# Patient Record
Sex: Female | Born: 1935 | Race: White | Hispanic: No | Smoking: Former smoker
Health system: Southern US, Community
[De-identification: ages and names within clinical notes are randomized; demographics above are authoritative.]

## PROBLEM LIST (undated history)

## (undated) ENCOUNTER — Ambulatory Visit: Payer: MEDICARE

## (undated) ENCOUNTER — Telehealth: Attending: Dermatology | Primary: Dermatology

## (undated) ENCOUNTER — Telehealth

## (undated) ENCOUNTER — Ambulatory Visit: Payer: MEDICARE | Attending: MOHS-Micrographic Surgery | Primary: MOHS-Micrographic Surgery

## (undated) ENCOUNTER — Telehealth: Attending: MOHS-Micrographic Surgery | Primary: MOHS-Micrographic Surgery

## (undated) ENCOUNTER — Ambulatory Visit

## (undated) ENCOUNTER — Ambulatory Visit: Payer: MEDICARE | Attending: Dermatology | Primary: Dermatology

## (undated) DIAGNOSIS — K219 Gastro-esophageal reflux disease without esophagitis: Secondary | ICD-10-CM

## (undated) DIAGNOSIS — I219 Acute myocardial infarction, unspecified: Secondary | ICD-10-CM

## (undated) DIAGNOSIS — E119 Type 2 diabetes mellitus without complications: Secondary | ICD-10-CM

## (undated) DIAGNOSIS — M1990 Unspecified osteoarthritis, unspecified site: Secondary | ICD-10-CM

## (undated) DIAGNOSIS — I4891 Unspecified atrial fibrillation: Secondary | ICD-10-CM

## (undated) DIAGNOSIS — G459 Transient cerebral ischemic attack, unspecified: Secondary | ICD-10-CM

## (undated) DIAGNOSIS — I10 Essential (primary) hypertension: Secondary | ICD-10-CM

## (undated) DIAGNOSIS — E785 Hyperlipidemia, unspecified: Secondary | ICD-10-CM

## (undated) DIAGNOSIS — J449 Chronic obstructive pulmonary disease, unspecified: Secondary | ICD-10-CM

## (undated) DIAGNOSIS — I639 Cerebral infarction, unspecified: Secondary | ICD-10-CM

## (undated) DIAGNOSIS — Z9981 Dependence on supplemental oxygen: Secondary | ICD-10-CM

## (undated) DIAGNOSIS — T7840XA Allergy, unspecified, initial encounter: Secondary | ICD-10-CM

## (undated) HISTORY — DX: Unspecified osteoarthritis, unspecified site: M19.90

## (undated) HISTORY — DX: Hyperlipidemia, unspecified: E78.5

## (undated) HISTORY — DX: Gastro-esophageal reflux disease without esophagitis: K21.9

## (undated) HISTORY — PX: CORONARY ANGIOPLASTY WITH STENT PLACEMENT: SHX49

## (undated) HISTORY — DX: Allergy, unspecified, initial encounter: T78.40XA

## (undated) HISTORY — DX: Acute myocardial infarction, unspecified: I21.9

## (undated) HISTORY — DX: Type 2 diabetes mellitus without complications: E11.9

## (undated) HISTORY — DX: Essential (primary) hypertension: I10

## (undated) NOTE — *Deleted (*Deleted)
Physician Discharge Summary  Peggy [name] [facility] ZOX:[id number] DOB: May 18, 1929 DOA: 03/30/2020  PCP: Lauro Regulus, MD  Admit date: 03/30/2020 Discharge date: 04/01/2020  Discharge disposition: ***   Recommendations for Outpatient Follow-Up:   1. *** (include homehealth, outpatient follow-up instructions, specific recommendations for PCP to follow-up on, etc.) 2. LACE score = ***, corresponding to a *** % of re-admission within 30 days.   (http://tools.farmacologiaclinica.info/index.php?sid=10044)   Discharge Diagnosis:   Principal Problem:   COPD with acute exacerbation (HCC) Active Problems:   Essential (primary) hypertension   AF (paroxysmal atrial fibrillation) (HCC)   Chronic respiratory failure with hypoxia (HCC)   Elevated troponin level   Leukocytosis    Discharge Condition: Stable.  Diet recommendation:  Diet Order            Diet - low sodium heart healthy           Diet Carb Modified           Diet Carb Modified Fluid consistency: Thin; Room service appropriate? Yes  Diet effective now                   Code Status: Full Code     Hospital Course:   ***    Medical Consultants:    ***   Discharge Exam:    Vitals:   03/31/20 2009 03/31/20 2048 04/01/20 0458 04/01/20 0758  BP:  (!) 164/49 (!) 175/64 139/77  Pulse:  67 (!) 59 61  Resp:  18 17 18   Temp:  98.1 F (36.7 C) 97.6 F (36.4 C) 97.9 F (36.6 C)  TempSrc:  Oral Oral Oral  SpO2: 97% 99% 100% 100%  Weight:   77.2 kg   Height:         GEN: NAD SKIN: No rash*** EYES: EOMI*** ENT: MMM CV: RRR PULM: CTA B ABD: soft, ND, NT, +BS CNS: AAO x 3, non focal EXT: No edema or tenderness***   The results of significant diagnostics from this hospitalization (including imaging, microbiology, ancillary and laboratory) are listed below for reference.     Procedures and Diagnostic Studies:   No results found.   Labs:   Basic Metabolic Panel: Recent Labs  Lab 03/30/20  1045 03/31/20 0503  NA 137 134*  K 4.2 4.3  CL 101 96*  CO2 26 28  GLUCOSE 101* 250*  BUN 34* 33*  CREATININE 0.98 0.82  CALCIUM 8.8* 8.8*   GFR Estimated Creatinine Clearance: 44.7 mL/min (by C-G formula based on SCr of 0.82 mg/dL). Liver Function Tests: No results for input(s): AST, ALT, ALKPHOS, BILITOT, PROT, ALBUMIN in the last 168 hours. No results for input(s): LIPASE, AMYLASE in the last 168 hours. No results for input(s): AMMONIA in the last 168 hours. Coagulation profile No results for input(s): INR, PROTIME in the last 168 hours.  CBC: Recent Labs  Lab 03/30/20 1045 03/31/20 0503  WBC 20.7* 17.2*  HGB 10.5* 10.0*  HCT 34.0* 33.0*  MCV 84.2 84.8  PLT 351 302   Cardiac Enzymes: No results for input(s): CKTOTAL, CKMB, CKMBINDEX, TROPONINI in the last 168 hours. BNP: Invalid input(s): POCBNP CBG: Recent Labs  Lab 03/31/20 0750 03/31/20 1145 03/31/20 1702 03/31/20 2049 04/01/20 0754  GLUCAP 198* 233* 228* 242* 170*   D-Dimer No results for input(s): DDIMER in the last 72 hours. Hgb A1c Recent Labs    03/30/20 1457  HGBA1C 7.4*   Lipid Profile No results for input(s): CHOL, HDL, LDLCALC, TRIG, CHOLHDL, LDLDIRECT in  the last 72 hours. Thyroid function studies No results for input(s): TSH, T4TOTAL, T3FREE, THYROIDAB in the last 72 hours.  Invalid input(s): FREET3 Anemia work up No results for input(s): VITAMINB12, FOLATE, FERRITIN, TIBC, IRON, RETICCTPCT in the last 72 hours. Microbiology Recent Results (from the past 240 hour(s))  Respiratory Panel by RT PCR (Flu A&B, Covid) - Nasopharyngeal Swab     Status: None   Collection Time: 03/30/20  2:47 PM   Specimen: Nasopharyngeal Swab  Result Value Ref Range Status   SARS Coronavirus 2 by RT PCR NEGATIVE NEGATIVE Final    Comment: (NOTE) SARS-CoV-2 target nucleic acids are NOT DETECTED.  The SARS-CoV-2 RNA is generally detectable in upper respiratoy specimens during the acute phase of  infection. The lowest concentration of SARS-CoV-2 viral copies this assay can detect is 131 copies/mL. A negative result does not preclude SARS-Cov-2 infection and should not be used as the sole basis for treatment or other patient management decisions. A negative result may occur with  improper specimen collection/handling, submission of specimen other than nasopharyngeal swab, presence of viral mutation(s) within the areas targeted by this assay, and inadequate number of viral copies (<131 copies/mL). A negative result must be combined with clinical observations, patient history, and epidemiological information. The expected result is Negative.  Fact Sheet for Patients:  https://www.moore.com/  Fact Sheet for Healthcare Providers:  https://www.young.biz/  This test is no t yet approved or cleared by the [place] FDA and  has been authorized for detection and/or diagnosis of SARS-CoV-2 by FDA under an Emergency Use Authorization (EUA). This EUA will remain  in effect (meaning this test can be used) for the duration of the COVID-19 declaration under Section 564(b)(1) of the Act, 21 U.S.C. section 360bbb-3(b)(1), unless the authorization is terminated or revoked sooner.     Influenza A by PCR NEGATIVE NEGATIVE Final   Influenza B by PCR NEGATIVE NEGATIVE Final    Comment: (NOTE) The Xpert Xpress SARS-CoV-2/FLU/RSV assay is intended as an aid in  the diagnosis of influenza from Nasopharyngeal swab specimens and  should not be used as a sole basis for treatment. Nasal washings and  aspirates are unacceptable for Xpert Xpress SARS-CoV-2/FLU/RSV  testing.  Fact Sheet for Patients: https://www.moore.com/  Fact Sheet for Healthcare Providers: https://www.young.biz/  This test is not yet approved or cleared by the [place] FDA and  has been authorized for detection and/or diagnosis of SARS-CoV-2  by  FDA under an Emergency Use Authorization (EUA). This EUA will remain  in effect (meaning this test can be used) for the duration of the  Covid-19 declaration under Section 564(b)(1) of the Act, 21  U.S.C. section 360bbb-3(b)(1), unless the authorization is  terminated or revoked. Performed at [facility], [address] [place], [place] [address]   Resp Panel by RT PCR (RSV, Flu A&B, Covid) - Nasopharyngeal Swab     Status: None   Collection Time: 03/30/20  2:47 PM   Specimen: Nasopharyngeal Swab  Result Value Ref Range Status   SARS Coronavirus 2 by RT PCR NEGATIVE NEGATIVE Final    Comment: (NOTE) SARS-CoV-2 target nucleic acids are NOT DETECTED.  The SARS-CoV-2 RNA is generally detectable in upper respiratoy specimens during the acute phase of infection. The lowest concentration of SARS-CoV-2 viral copies this assay can detect is 131 copies/mL. A negative result does not preclude SARS-Cov-2 infection and should not be used as the sole basis for treatment or other patient management decisions. A negative result may occur  with  improper specimen collection/handling, submission of specimen other than nasopharyngeal swab, presence of viral mutation(s) within the areas targeted by this assay, and inadequate number of viral copies (<131 copies/mL). A negative result must be combined with clinical observations, patient history, and epidemiological information. The expected result is Negative.  Fact Sheet for Patients:  https://www.moore.com/  Fact Sheet for Healthcare Providers:  https://www.young.biz/  This test is no t yet approved or cleared by the [place] FDA and  has been authorized for detection and/or diagnosis of SARS-CoV-2 by FDA under an Emergency Use Authorization (EUA). This EUA will remain  in effect (meaning this test can be used) for the duration of the COVID-19 declaration under Section 564(b)(1) of the  Act, 21 U.S.C. section 360bbb-3(b)(1), unless the authorization is terminated or revoked sooner.     Influenza A by PCR NEGATIVE NEGATIVE Final   Influenza B by PCR NEGATIVE NEGATIVE Final    Comment: (NOTE) The Xpert Xpress SARS-CoV-2/FLU/RSV assay is intended as an aid in  the diagnosis of influenza from Nasopharyngeal swab specimens and  should not be used as a sole basis for treatment. Nasal washings and  aspirates are unacceptable for Xpert Xpress SARS-CoV-2/FLU/RSV  testing.  Fact Sheet for Patients: https://www.moore.com/  Fact Sheet for Healthcare Providers: https://www.young.biz/  This test is not yet approved or cleared by the [place] FDA and  has been authorized for detection and/or diagnosis of SARS-CoV-2 by  FDA under an Emergency Use Authorization (EUA). This EUA will remain  in effect (meaning this test can be used) for the duration of the  Covid-19 declaration under Section 564(b)(1) of the Act, 21  U.S.C. section 360bbb-3(b)(1), unless the authorization is  terminated or revoked.    Respiratory Syncytial Virus by PCR NEGATIVE NEGATIVE Final    Comment: (NOTE) Fact Sheet for Patients: https://www.moore.com/  Fact Sheet for Healthcare Providers: https://www.young.biz/  This test is not yet approved or cleared by the [place] FDA and  has been authorized for detection and/or diagnosis of SARS-CoV-2 by  FDA under an Emergency Use Authorization (EUA). This EUA will remain  in effect (meaning this test can be used) for the duration of the  COVID-19 declaration under Section 564(b)(1) of the Act, 21 U.S.C.  section 360bbb-3(b)(1), unless the authorization is terminated or  revoked. Performed at [facility], [address] [place], [place] [address]      Discharge Instructions:   Discharge Instructions    Diet - low sodium heart healthy   Complete by: As  directed    Diet Carb Modified   Complete by: As directed    Increase activity slowly   Complete by: As directed      Allergies as of 04/01/2020      Reactions   Diphenhydramine Rash   AGITATION/DELIRIUM   Metformin Diarrhea   Oxybutynin Other (See Comments)   dizzy   Amlodipine Rash   Ciprofloxacin Rash   Lisinopril Rash   Penicillins Rash   Family states was a "long time ago"   Saxagliptin Rash   Sulfamethoxazole-trimethoprim Rash      Medication List    STOP taking these medications   levofloxacin 500 MG tablet Commonly known as: LEVAQUIN     TAKE these medications   aspirin 81 MG EC tablet Take 1 tablet (81 mg total) by mouth daily.   budesonide-formoterol 80-4.5 MCG/ACT inhaler Commonly known as: SYMBICORT Inhale 2 puffs into the lungs 2 (two) times daily.   chlorpheniramine-HYDROcodone 10-8 MG/5ML  Suer Commonly known as: Tussionex Pennkinetic ER Take 5 mLs by mouth every 12 (twelve) hours as needed for cough.   esomeprazole 40 MG capsule Commonly known as: NexIUM Take 1 capsule (40 mg total) by mouth daily.   gabapentin 100 MG capsule Commonly known as: NEURONTIN Take 100 mg by mouth daily.   guaiFENesin 600 MG 12 hr tablet Commonly known as: MUCINEX Take by mouth 2 (two) times daily.   HumuLIN 70/30 KwikPen (70-30) 100 UNIT/ML KwikPen Generic drug: insulin isophane & regular human Inject 35 Units into the skin 2 (two) times daily.   hydrOXYzine 25 MG tablet Commonly known as: ATARAX/VISTARIL Take 25 mg by mouth 4 (four) times daily as needed for itching.   isosorbide mononitrate 30 MG 24 hr tablet Commonly known as: IMDUR Take 30 mg by mouth daily.   lactobacillus acidophilus Tabs tablet Take 2 tablets by mouth daily.   levothyroxine 50 MCG tablet Commonly known as: SYNTHROID Take 1 tablet (50 mcg total) by mouth daily. PATIENT NEEDS TO SCHEDULE OFFICE VISIT FOR FOLLOW UP   metoprolol succinate 100 MG 24 hr tablet Commonly known as:  TOPROL-XL Take 1 tablet (100 mg total) by mouth daily. Take with or immediately following a meal.   nitroGLYCERIN 0.4 MG SL tablet Commonly known as: NITROSTAT Place 0.4 mg under the tongue as needed for chest pain.   OXYGEN Inhale 2 L into the lungs.   predniSONE 20 MG tablet Commonly known as: DELTASONE Take 2 tablets (40 mg total) by mouth daily with breakfast for 2 days. Start taking on: April 02, 2020   psyllium 0.52 g capsule Commonly known as: REGULOID Take 0.52 g by mouth daily.   traZODone 50 MG tablet Commonly known as: DESYREL Take 50 mg by mouth at bedtime.   trospium 20 MG tablet Commonly known as: SANCTURA Take 1 tablet (20 mg total) by mouth daily. What changed: when to take this   Ventolin HFA 108 (90 Base) MCG/ACT inhaler Generic drug: albuterol Inhale 2 puffs into the lungs every 6 (six) hours as needed.   vitamin B-12 1000 MCG tablet Commonly known as: CYANOCOBALAMIN Take 1,000 mcg by mouth daily.   vitamin C with rose hips 1000 MG tablet Take 1,000 mg by mouth daily.   Vitamin D3 125 MCG (5000 UT) Tabs Take 5,000 Units by mouth daily.         Time coordinating discharge: ***  Signed:  BERNARD AYIKU  [facility] 04/01/2020, 10:45 AM   Pager on [email or web address]. If 7PM-7AM, please contact night-coverage at [email or web address]

## (undated) NOTE — *Deleted (*Deleted)
CARDIOLOGY CONSULT NOTE               Patient ID: Peggy [name] MRN: [id number] DOB/AGE: December 10, 1928 76 y.o.  Admit date: 03/30/2020 Referring Physician *** Primary Physician *** Primary Cardiologist *** Reason for Consultation ***  HPI: ***  Review of systems complete and found to be negative unless listed above     Past Medical History:  Diagnosis Date  . A-fib (HCC)   . Allergy   . Arthritis   . COPD (chronic obstructive pulmonary disease) (HCC)   . Diabetes mellitus without complication (HCC)   . GERD (gastroesophageal reflux disease)   . Hyperlipidemia   . Hypertension   . Oxygen dependent    2 liters  . Stroke [facility])    5 years ago per daughter  . TIA (transient ischemic attack)     Past Surgical History:  Procedure Laterality Date  . ABDOMINAL HYSTERECTOMY  1970  . BACK SURGERY  2013  . colectomy Right 10/08/2013   Dr. Egbert Garibaldi  . CORONARY ANGIOPLASTY WITH STENT PLACEMENT      Medications Prior to Admission  Medication Sig Dispense Refill Last Dose  . Ascorbic Acid (VITAMIN C WITH ROSE HIPS) 1000 MG tablet Take 1,000 mg by mouth daily.   03/29/2020 at 0800  . aspirin EC 81 MG EC tablet Take 1 tablet (81 mg total) by mouth daily. 30 tablet 0 03/29/2020 at 0800  . budesonide-formoterol (SYMBICORT) 80-4.5 MCG/ACT inhaler Inhale 2 puffs into the lungs 2 (two) times daily. 1 each 0 03/29/2020 at 2000  . chlorpheniramine-HYDROcodone (TUSSIONEX PENNKINETIC ER) 10-8 MG/5ML SUER Take 5 mLs by mouth every 12 (twelve) hours as needed for cough. 240 mL 0 Past Week at prn  . Cholecalciferol (VITAMIN D3) 5000 units TABS Take 5,000 Units by mouth daily.   03/29/2020 at 0800  . esomeprazole (NEXIUM) 40 MG capsule Take 1 capsule (40 mg total) by mouth daily. 30 capsule 3 03/29/2020 at 0800  . gabapentin (NEURONTIN) 100 MG capsule Take 100 mg by mouth daily.   03/29/2020 at 2000  . guaiFENesin (MUCINEX) 600 MG 12 hr tablet Take by mouth 2 (two) times daily.   Past Week at prn   . HUMULIN 70/30 KWIKPEN (70-30) 100 UNIT/ML PEN Inject 20 Units into the skin 2 (two) times daily. (Patient taking differently: Inject 35 Units into the skin 2 (two) times daily. ) 15 mL 5 03/29/2020 at 1800  . isosorbide mononitrate (IMDUR) 30 MG 24 hr tablet Take 30 mg by mouth daily.   03/29/2020 at 0800  . lactobacillus acidophilus (BACID) TABS tablet Take 2 tablets by mouth daily.   03/29/2020 at 0800  . levofloxacin (LEVAQUIN) 500 MG tablet Take 1 tablet (500 mg total) by mouth daily. 7 tablet 0 Past Week at Unknown time  . levothyroxine (SYNTHROID, LEVOTHROID) 50 MCG tablet Take 1 tablet (50 mcg total) by mouth daily. PATIENT NEEDS TO SCHEDULE OFFICE VISIT FOR FOLLOW UP 90 tablet 0 03/29/2020 at 0700  . metoprolol succinate (TOPROL-XL) 100 MG 24 hr tablet Take 1 tablet (100 mg total) by mouth daily. Take with or immediately following a meal. 30 tablet 0 03/29/2020 at 0830  . nitroGLYCERIN (NITROSTAT) 0.4 MG SL tablet Place 0.4 mg under the tongue as needed for chest pain.   unknown at prn  . trospium (SANCTURA) 20 MG tablet Take 20 mg by mouth 2 (two) times daily.   03/29/2020 at 0800  . VENTOLIN HFA 108 (90 Base) MCG/ACT inhaler Inhale 2  puffs into the lungs every 6 (six) hours as needed.  1 03/29/2020 at 2200  . vitamin B-12 (CYANOCOBALAMIN) 1000 MCG tablet Take 1,000 mcg by mouth daily.   03/29/2020 at 0800  . hydrOXYzine (ATARAX/VISTARIL) 25 MG tablet Take 25 mg by mouth 4 (four) times daily as needed for itching.  5 unknown at prn  . OXYGEN Inhale 2 L into the lungs.     . psyllium (REGULOID) 0.52 g capsule Take 0.52 g by mouth daily.   unknown at prn  . traZODone (DESYREL) 50 MG tablet Take 50 mg by mouth at bedtime.   unknown at prn   Social History   Socioeconomic History  . Marital status: Widowed    Spouse name: Not on file  . Number of children: 3  . Years of education: H/S  . Highest education level: Not on file  Occupational History  . Occupation: Retired  Tobacco Use   . Smoking status: Former Smoker    Packs/day: 1.00    Years: 30.00    Pack years: 30.00    Quit date: 11/30/1999    Years since quitting: 20.3  . Smokeless tobacco: Never Used  Vaping Use  . Vaping Use: Never used  Substance and Sexual Activity  . Alcohol use: No  . Drug use: No  . Sexual activity: Not on file  Other Topics Concern  . Not on file  Social History Narrative  . Not on file   Social Determinants of Health   Financial Resource Strain:   . Difficulty of Paying Living Expenses: Not on file  Food Insecurity:   . Worried About Programme researcher, broadcasting/film/video in the Last Year: Not on file  . Ran Out of Food in the Last Year: Not on file  Transportation Needs:   . Lack of Transportation (Medical): Not on file  . Lack of Transportation (Non-Medical): Not on file  Physical Activity:   . Days of Exercise per Week: Not on file  . Minutes of Exercise per Session: Not on file  Stress:   . Feeling of Stress : Not on file  Social Connections:   . Frequency of Communication with Friends and Family: Not on file  . Frequency of Social Gatherings with Friends and Family: Not on file  . Attends Religious Services: Not on file  . Active Member of Clubs or Organizations: Not on file  . Attends Banker Meetings: Not on file  . Marital Status: Not on file  Intimate Partner Violence:   . Fear of Current or Ex-Partner: Not on file  . Emotionally Abused: Not on file  . Physically Abused: Not on file  . Sexually Abused: Not on file    Family History  Problem Relation Age of Onset  . Breast cancer Mother   . Pancreatitis Father   . Breast cancer Sister   . Lung cancer Brother   . Melanoma Brother   . Throat cancer Brother       Review of systems complete and found to be negative unless listed above      PHYSICAL EXAM  General: Well developed, well nourished, in no acute distress HEENT:  Normocephalic and atramatic Neck:  No JVD.  Lungs: Clear bilaterally to  auscultation and percussion. Heart: HRRR . Normal S1 and S2 without gallops or murmurs.  Abdomen: Bowel sounds are positive, abdomen soft and non-tender  Msk:  Back normal, normal gait. Normal strength and tone for age. Extremities: No clubbing, cyanosis or edema.  Neuro: Alert and oriented X 3. Psych:  Good affect, responds appropriately  Labs:   Lab Results  Component Value Date   WBC 20.7 (H) 03/30/2020   HGB 10.5 (L) 03/30/2020   HCT 34.0 (L) 03/30/2020   MCV 84.2 03/30/2020   PLT 351 03/30/2020    Recent Labs  Lab 03/30/20 1045  NA 137  K 4.2  CL 101  CO2 26  BUN 34*  CREATININE 0.98  CALCIUM 8.8*  GLUCOSE 101*   Lab Results  Component Value Date   CKTOTAL 130 12/10/2016   CKMB 3.4 10/06/2013   TROPONINI 0.52 (HH) 11/22/2018    Lab Results  Component Value Date   CHOL 216 (H) 03/07/2019   CHOL 175 12/10/2016   CHOL 272 (H) 09/04/2015   Lab Results  Component Value Date   HDL 33 (L) 03/07/2019   HDL 29 (L) 12/10/2016   HDL 33 (L) 09/04/2015   Lab Results  Component Value Date   LDLCALC 110 (H) 03/07/2019   LDLCALC 69 12/10/2016   LDLCALC Comment 09/04/2015   Lab Results  Component Value Date   TRIG 365 (H) 03/07/2019   TRIG 387 (H) 12/10/2016   TRIG 722 (HH) 09/04/2015   Lab Results  Component Value Date   CHOLHDL 6.5 03/07/2019   CHOLHDL 6.0 12/10/2016   CHOLHDL 8.2 (H) 09/04/2015   No results found for: LDLDIRECT    Radiology: DG Chest 2 View  Result Date: 03/30/2020 CLINICAL DATA:  Shortness of breath for 3 weeks EXAM: CHEST - 2 VIEW COMPARISON:  03/22/2020 FINDINGS: Cardiac silhouette remains mildly enlarged. Atherosclerotic calcification of the aortic knob. Mildly hyperexpanded lungs. Chronically coarsened interstitial markings bilaterally. No focal airspace consolidation. No pleural effusion or pneumothorax. IMPRESSION: Stable exam.  No acute cardiopulmonary process. Electronically Signed   By: Duanne Guess D.O.   On: 03/30/2020  12:55   DG Chest 2 View  Result Date: 03/23/2020 CLINICAL DATA:  Dry cough and shortness of breath for 1 week. Ex-smoker. EXAM: CHEST - 2 VIEW COMPARISON:  01/26/2020 FINDINGS: The cardiac silhouette remains borderline enlarged. Stable hyperexpansion of the lungs diffuse prominence of the interstitial markings without pleural fluid. Diffuse osteopenia. IMPRESSION: No acute abnormality. Stable changes of COPD. Electronically Signed   By: Beckie Salts M.D.   On: 03/23/2020 20:47    EKG: ***  ASSESSMENT AND PLAN:  ***  Signed: Alwyn Pea MD 03/30/2020, 10:34 PM

---
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
## 1968-06-02 HISTORY — PX: ABDOMINAL HYSTERECTOMY: SHX81

## 2006-06-10 ENCOUNTER — Ambulatory Visit: Payer: Self-pay | Admitting: Unknown Physician Specialty

## 2007-06-13 ENCOUNTER — Other Ambulatory Visit: Payer: Self-pay

## 2007-06-13 ENCOUNTER — Inpatient Hospital Stay: Payer: Self-pay | Admitting: Endocrinology

## 2007-06-14 ENCOUNTER — Other Ambulatory Visit: Payer: Self-pay

## 2007-06-15 ENCOUNTER — Other Ambulatory Visit: Payer: Self-pay

## 2008-08-07 ENCOUNTER — Emergency Department: Payer: Self-pay | Admitting: Emergency Medicine

## 2008-12-29 ENCOUNTER — Ambulatory Visit: Payer: Self-pay | Admitting: Unknown Physician Specialty

## 2009-02-19 ENCOUNTER — Ambulatory Visit: Payer: Self-pay | Admitting: Pain Medicine

## 2009-03-06 ENCOUNTER — Ambulatory Visit: Payer: Self-pay | Admitting: Pain Medicine

## 2009-09-11 ENCOUNTER — Ambulatory Visit: Payer: Self-pay | Admitting: Neurology

## 2009-10-30 ENCOUNTER — Ambulatory Visit: Payer: Self-pay | Admitting: Neurology

## 2010-02-07 ENCOUNTER — Ambulatory Visit: Payer: Self-pay

## 2010-03-20 ENCOUNTER — Ambulatory Visit: Payer: Self-pay | Admitting: Pain Medicine

## 2010-04-04 ENCOUNTER — Ambulatory Visit: Payer: Self-pay | Admitting: Pain Medicine

## 2010-04-17 ENCOUNTER — Ambulatory Visit: Payer: Self-pay | Admitting: Pain Medicine

## 2010-05-21 ENCOUNTER — Ambulatory Visit: Payer: Self-pay | Admitting: Pain Medicine

## 2010-06-24 ENCOUNTER — Ambulatory Visit: Payer: Self-pay | Admitting: Pain Medicine

## 2010-09-13 ENCOUNTER — Encounter: Payer: Self-pay | Admitting: Internal Medicine

## 2010-10-01 ENCOUNTER — Encounter: Payer: Self-pay | Admitting: Internal Medicine

## 2011-01-23 ENCOUNTER — Ambulatory Visit: Payer: Self-pay | Admitting: Internal Medicine

## 2011-02-01 ENCOUNTER — Ambulatory Visit: Payer: Self-pay | Admitting: Internal Medicine

## 2011-04-09 ENCOUNTER — Inpatient Hospital Stay: Payer: Self-pay | Admitting: Internal Medicine

## 2011-06-03 HISTORY — PX: BACK SURGERY: SHX140

## 2011-08-07 ENCOUNTER — Emergency Department: Payer: Self-pay | Admitting: Unknown Physician Specialty

## 2011-08-07 LAB — CBC
HGB: 12.2 g/dL (ref 12.0–16.0)
MCHC: 31.4 g/dL — ABNORMAL LOW (ref 32.0–36.0)
Platelet: 369 10*3/uL (ref 150–440)
RDW: 15.1 % — ABNORMAL HIGH (ref 11.5–14.5)
WBC: 25.3 10*3/uL — ABNORMAL HIGH (ref 3.6–11.0)

## 2011-08-07 LAB — PROTIME-INR: Prothrombin Time: 15.8 secs — ABNORMAL HIGH (ref 11.5–14.7)

## 2011-08-08 LAB — BASIC METABOLIC PANEL
Anion Gap: 25 — ABNORMAL HIGH (ref 7–16)
Calcium, Total: 8.6 mg/dL (ref 8.5–10.1)
Chloride: 94 mmol/L — ABNORMAL LOW (ref 98–107)
Co2: 13 mmol/L — ABNORMAL LOW (ref 21–32)
EGFR (Non-African Amer.): 28 — ABNORMAL LOW
Glucose: 820 mg/dL (ref 65–99)
Osmolality: 311 (ref 275–301)
Potassium: 5.7 mmol/L — ABNORMAL HIGH (ref 3.5–5.1)
Sodium: 132 mmol/L — ABNORMAL LOW (ref 136–145)

## 2011-08-08 LAB — TROPONIN I: Troponin-I: 0.81 ng/mL — ABNORMAL HIGH

## 2012-02-09 ENCOUNTER — Encounter: Payer: Self-pay | Admitting: Cardiology

## 2012-03-02 ENCOUNTER — Encounter: Payer: Self-pay | Admitting: Cardiology

## 2012-04-02 ENCOUNTER — Encounter: Payer: Self-pay | Admitting: Cardiology

## 2012-05-02 ENCOUNTER — Encounter: Payer: Self-pay | Admitting: Cardiology

## 2012-08-10 ENCOUNTER — Ambulatory Visit: Payer: Self-pay | Admitting: Internal Medicine

## 2012-08-11 ENCOUNTER — Emergency Department: Payer: Self-pay | Admitting: Emergency Medicine

## 2012-08-11 LAB — COMPREHENSIVE METABOLIC PANEL
BUN: 20 mg/dL — ABNORMAL HIGH (ref 7–18)
Bilirubin,Total: 0.4 mg/dL (ref 0.2–1.0)
Calcium, Total: 8.7 mg/dL (ref 8.5–10.1)
Chloride: 105 mmol/L (ref 98–107)
EGFR (African American): >60
EGFR (Non-African Amer.): 52 — ABNORMAL LOW
Glucose: 145 mg/dL — ABNORMAL HIGH (ref 65–99)
Osmolality: 273 (ref 275–301)
Potassium: 4.5 mmol/L (ref 3.5–5.1)
SGPT (ALT): 18 U/L (ref 12–78)

## 2012-08-11 LAB — URINALYSIS, COMPLETE
Blood: NEGATIVE
Glucose,UR: NEGATIVE mg/dL (ref 0–75)
Hyaline Cast: 43
Ketone: NEGATIVE
Nitrite: NEGATIVE
Ph: 5 (ref 4.5–8.0)
Protein: NEGATIVE
Specific Gravity: 1.02 (ref 1.003–1.030)
Squamous Epithelial: 1
Transitional Epi: 2

## 2012-08-11 LAB — CBC
HGB: 7.9 g/dL — ABNORMAL LOW (ref 12.0–16.0)
MCH: 18.2 pg — ABNORMAL LOW (ref 26.0–34.0)
MCHC: 28.6 g/dL — ABNORMAL LOW (ref 32.0–36.0)
MCV: 64 fL — ABNORMAL LOW (ref 80–100)
RDW: 17.3 % — ABNORMAL HIGH (ref 11.5–14.5)
WBC: 13.1 10*3/uL — ABNORMAL HIGH (ref 3.6–11.0)

## 2012-08-11 LAB — PROTIME-INR
INR: 1.6
Prothrombin Time: 18.5 secs — ABNORMAL HIGH (ref 11.5–14.7)

## 2012-08-11 LAB — LIPASE, BLOOD: Lipase: 92 U/L (ref 73–393)

## 2012-09-09 ENCOUNTER — Ambulatory Visit: Payer: Self-pay | Admitting: Internal Medicine

## 2012-09-30 ENCOUNTER — Ambulatory Visit: Payer: Self-pay | Admitting: Internal Medicine

## 2013-07-05 ENCOUNTER — Ambulatory Visit: Payer: Self-pay | Admitting: Family Medicine

## 2013-10-05 DIAGNOSIS — K5790 Diverticulosis of intestine, part unspecified, without perforation or abscess without bleeding: Secondary | ICD-10-CM | POA: Insufficient documentation

## 2013-10-05 LAB — BASIC METABOLIC PANEL
Anion Gap: 11 (ref 7–16)
BUN: 19 mg/dL — AB (ref 7–18)
Calcium, Total: 9.3 mg/dL (ref 8.5–10.1)
Chloride: 107 mmol/L (ref 98–107)
Co2: 20 mmol/L — ABNORMAL LOW (ref 21–32)
Creatinine: 0.85 mg/dL (ref 0.60–1.30)
EGFR (African American): >60
EGFR (Non-African Amer.): >60
GLUCOSE: 196 mg/dL — AB (ref 65–99)
Osmolality: 283 (ref 275–301)
Potassium: 4.5 mmol/L (ref 3.5–5.1)
SODIUM: 138 mmol/L (ref 136–145)

## 2013-10-05 LAB — CBC
HCT: 34.9 % — ABNORMAL LOW (ref 35.0–47.0)
HGB: 11.4 g/dL — AB (ref 12.0–16.0)
MCH: 26.2 pg (ref 26.0–34.0)
MCHC: 32.8 g/dL (ref 32.0–36.0)
MCV: 80 fL (ref 80–100)
PLATELETS: 280 10*3/uL (ref 150–440)
RBC: 4.37 10*6/uL (ref 3.80–5.20)
RDW: 14.6 % — ABNORMAL HIGH (ref 11.5–14.5)
WBC: 9.8 10*3/uL (ref 3.6–11.0)

## 2013-10-05 LAB — TROPONIN I: Troponin-I: <0.02 ng/mL

## 2013-10-06 ENCOUNTER — Inpatient Hospital Stay: Payer: Self-pay | Admitting: Internal Medicine

## 2013-10-06 LAB — CBC WITH DIFFERENTIAL/PLATELET
BASOS ABS: 0.1 10*3/uL (ref 0.0–0.1)
BASOS ABS: 0.1 10*3/uL (ref 0.0–0.1)
BASOS PCT: 0.6 %
BASOS PCT: 0.8 %
EOS ABS: 0.7 10*3/uL (ref 0.0–0.7)
Eosinophil #: 0.8 10*3/uL — ABNORMAL HIGH (ref 0.0–0.7)
Eosinophil %: 8.1 %
Eosinophil %: 8.3 %
HCT: 34.7 % — AB (ref 35.0–47.0)
HCT: 35 % (ref 35.0–47.0)
HGB: 11.2 g/dL — AB (ref 12.0–16.0)
HGB: 11.2 g/dL — AB (ref 12.0–16.0)
LYMPHS ABS: 1.8 10*3/uL (ref 1.0–3.6)
LYMPHS PCT: 20.1 %
Lymphocyte #: 1.8 10*3/uL (ref 1.0–3.6)
Lymphocyte %: 19.6 %
MCH: 25.9 pg — AB (ref 26.0–34.0)
MCH: 25.7 pg — ABNORMAL LOW (ref 26.0–34.0)
MCHC: 32.4 g/dL (ref 32.0–36.0)
MCHC: 32.1 g/dL (ref 32.0–36.0)
MCV: 80 fL (ref 80–100)
MCV: 80 fL (ref 80–100)
MONO ABS: 0.6 x10 3/mm (ref 0.2–0.9)
MONOS PCT: 7.1 %
Monocyte #: 0.7 x10 3/mm (ref 0.2–0.9)
Monocyte %: 8 %
NEUTROS ABS: 5.9 10*3/uL (ref 1.4–6.5)
Neutrophil #: 5.8 10*3/uL (ref 1.4–6.5)
Neutrophil %: 64.1 %
Neutrophil %: 63.3 %
PLATELETS: 249 10*3/uL (ref 150–440)
Platelet: 244 10*3/uL (ref 150–440)
RBC: 4.33 10*6/uL (ref 3.80–5.20)
RBC: 4.37 10*6/uL (ref 3.80–5.20)
RDW: 14.7 % — AB (ref 11.5–14.5)
RDW: 14.2 % (ref 11.5–14.5)
WBC: 9.2 10*3/uL (ref 3.6–11.0)
WBC: 9.1 10*3/uL (ref 3.6–11.0)

## 2013-10-06 LAB — CK-MB
CK-MB: 2.9 ng/mL (ref 0.5–3.6)
CK-MB: 3.4 ng/mL (ref 0.5–3.6)

## 2013-10-06 LAB — TROPONIN I: Troponin-I: <0.02 ng/mL

## 2013-10-07 LAB — LIPID PANEL
Cholesterol: 245 mg/dL — ABNORMAL HIGH (ref 0–200)
HDL Cholesterol: 29 mg/dL — ABNORMAL LOW (ref 40–60)
Triglycerides: 786 mg/dL — ABNORMAL HIGH (ref 0–200)

## 2013-10-07 LAB — HEMOGLOBIN: HGB: 10.8 g/dL — ABNORMAL LOW (ref 12.0–16.0)

## 2013-10-07 LAB — BASIC METABOLIC PANEL
Anion Gap: 8 (ref 7–16)
BUN: 17 mg/dL (ref 7–18)
Calcium, Total: 8.6 mg/dL (ref 8.5–10.1)
Chloride: 103 mmol/L (ref 98–107)
Co2: 25 mmol/L (ref 21–32)
Creatinine: 1.09 mg/dL (ref 0.60–1.30)
EGFR (African American): 55 — ABNORMAL LOW
EGFR (Non-African Amer.): 47 — ABNORMAL LOW
Glucose: 165 mg/dL — ABNORMAL HIGH (ref 65–99)
OSMOLALITY: 277 (ref 275–301)
POTASSIUM: 4.2 mmol/L (ref 3.5–5.1)
Sodium: 136 mmol/L (ref 136–145)

## 2013-10-07 LAB — LIPASE, BLOOD: Lipase: 113 U/L (ref 73–393)

## 2013-10-07 LAB — HM COLONOSCOPY

## 2013-10-07 LAB — D-DIMER(ARMC): D-Dimer: 351 ng/ml

## 2013-10-08 HISTORY — PX: OTHER SURGICAL HISTORY: SHX169

## 2013-10-08 LAB — BASIC METABOLIC PANEL
Anion Gap: 9 (ref 7–16)
BUN: 19 mg/dL — ABNORMAL HIGH (ref 7–18)
CALCIUM: 7.9 mg/dL — AB (ref 8.5–10.1)
CREATININE: 1.31 mg/dL — AB (ref 0.60–1.30)
Chloride: 99 mmol/L (ref 98–107)
Co2: 26 mmol/L (ref 21–32)
EGFR (African American): 44 — ABNORMAL LOW
EGFR (Non-African Amer.): 38 — ABNORMAL LOW
GLUCOSE: 155 mg/dL — AB (ref 65–99)
Osmolality: 274 (ref 275–301)
Potassium: 4.4 mmol/L (ref 3.5–5.1)
SODIUM: 134 mmol/L — AB (ref 136–145)

## 2013-10-08 LAB — CBC WITH DIFFERENTIAL/PLATELET
BASOS PCT: 0.3 %
Basophil #: 0.1 10*3/uL (ref 0.0–0.1)
Eosinophil #: 0 10*3/uL (ref 0.0–0.7)
Eosinophil %: 0.1 %
HCT: 32.3 % — AB (ref 35.0–47.0)
HGB: 10.1 g/dL — ABNORMAL LOW (ref 12.0–16.0)
LYMPHS PCT: 3.3 %
Lymphocyte #: 1 10*3/uL (ref 1.0–3.6)
MCH: 24.9 pg — AB (ref 26.0–34.0)
MCHC: 31.2 g/dL — ABNORMAL LOW (ref 32.0–36.0)
MCV: 80 fL (ref 80–100)
MONO ABS: 2 x10 3/mm — AB (ref 0.2–0.9)
Monocyte %: 6.7 %
Neutrophil #: 27.4 10*3/uL — ABNORMAL HIGH (ref 1.4–6.5)
Neutrophil %: 89.6 %
Platelet: 241 10*3/uL (ref 150–440)
RBC: 4.05 10*6/uL (ref 3.80–5.20)
RDW: 14.2 % (ref 11.5–14.5)
WBC: 30.5 10*3/uL — ABNORMAL HIGH (ref 3.6–11.0)

## 2013-10-08 LAB — AMMONIA: Ammonia, Plasma: 27 mcmol/L (ref 11–32)

## 2013-10-08 LAB — HEMOGLOBIN: HGB: 10.8 g/dL — AB (ref 12.0–16.0)

## 2013-10-08 LAB — LIPASE, BLOOD: Lipase: 62 U/L — ABNORMAL LOW (ref 73–393)

## 2013-10-08 LAB — HEPATIC FUNCTION PANEL A (ARMC)
ALBUMIN: 2.7 g/dL — AB (ref 3.4–5.0)
AST: 16 U/L (ref 15–37)
Alkaline Phosphatase: 64 U/L
BILIRUBIN DIRECT: 0.2 mg/dL (ref 0.00–0.20)
BILIRUBIN TOTAL: 0.7 mg/dL (ref 0.2–1.0)
SGPT (ALT): 19 U/L (ref 12–78)
TOTAL PROTEIN: 6.2 g/dL — AB (ref 6.4–8.2)

## 2013-10-09 LAB — CBC WITH DIFFERENTIAL/PLATELET
Basophil #: 0.1 10*3/uL (ref 0.0–0.1)
Basophil %: 0.4 %
EOS ABS: 0 10*3/uL (ref 0.0–0.7)
Eosinophil %: 0 %
HCT: 32.8 % — AB (ref 35.0–47.0)
HGB: 10.5 g/dL — ABNORMAL LOW (ref 12.0–16.0)
Lymphocyte #: 0.5 10*3/uL — ABNORMAL LOW (ref 1.0–3.6)
Lymphocyte %: 2.1 %
MCH: 25.3 pg — ABNORMAL LOW (ref 26.0–34.0)
MCHC: 32 g/dL (ref 32.0–36.0)
MCV: 79 fL — AB (ref 80–100)
MONOS PCT: 2.9 %
Monocyte #: 0.8 x10 3/mm (ref 0.2–0.9)
NEUTROS PCT: 94.6 %
Neutrophil #: 24.1 10*3/uL — ABNORMAL HIGH (ref 1.4–6.5)
PLATELETS: 244 10*3/uL (ref 150–440)
RBC: 4.15 10*6/uL (ref 3.80–5.20)
RDW: 14.3 % (ref 11.5–14.5)
WBC: 25.5 10*3/uL — AB (ref 3.6–11.0)

## 2013-10-09 LAB — BASIC METABOLIC PANEL
Anion Gap: 6 — ABNORMAL LOW (ref 7–16)
BUN: 21 mg/dL — ABNORMAL HIGH (ref 7–18)
CREATININE: 1.16 mg/dL (ref 0.60–1.30)
Calcium, Total: 8.2 mg/dL — ABNORMAL LOW (ref 8.5–10.1)
Chloride: 102 mmol/L (ref 98–107)
Co2: 27 mmol/L (ref 21–32)
EGFR (African American): 51 — ABNORMAL LOW
GFR CALC NON AF AMER: 44 — AB
Glucose: 225 mg/dL — ABNORMAL HIGH (ref 65–99)
Osmolality: 280 (ref 275–301)
Potassium: 4 mmol/L (ref 3.5–5.1)
SODIUM: 135 mmol/L — AB (ref 136–145)

## 2013-10-10 LAB — BASIC METABOLIC PANEL
Anion Gap: 7 (ref 7–16)
BUN: 22 mg/dL — ABNORMAL HIGH (ref 7–18)
CREATININE: 1.03 mg/dL (ref 0.60–1.30)
Calcium, Total: 8.5 mg/dL (ref 8.5–10.1)
Chloride: 105 mmol/L (ref 98–107)
Co2: 26 mmol/L (ref 21–32)
EGFR (Non-African Amer.): 51 — ABNORMAL LOW
GFR CALC AF AMER: 59 — AB
Glucose: 189 mg/dL — ABNORMAL HIGH (ref 65–99)
OSMOLALITY: 284 (ref 275–301)
Potassium: 4.2 mmol/L (ref 3.5–5.1)
Sodium: 138 mmol/L (ref 136–145)

## 2013-10-10 LAB — CBC WITH DIFFERENTIAL/PLATELET
Basophil #: 0.1 10*3/uL (ref 0.0–0.1)
Basophil %: 0.3 %
Eosinophil #: 0 10*3/uL (ref 0.0–0.7)
Eosinophil %: 0 %
HCT: 31.6 % — ABNORMAL LOW (ref 35.0–47.0)
HGB: 10.1 g/dL — AB (ref 12.0–16.0)
LYMPHS ABS: 1 10*3/uL (ref 1.0–3.6)
Lymphocyte %: 5 %
MCH: 25.7 pg — AB (ref 26.0–34.0)
MCHC: 32.1 g/dL (ref 32.0–36.0)
MCV: 80 fL (ref 80–100)
Monocyte #: 0.8 x10 3/mm (ref 0.2–0.9)
Monocyte %: 4 %
Neutrophil #: 17.8 10*3/uL — ABNORMAL HIGH (ref 1.4–6.5)
Neutrophil %: 90.7 %
PLATELETS: 286 10*3/uL (ref 150–440)
RBC: 3.94 10*6/uL (ref 3.80–5.20)
RDW: 14.3 % (ref 11.5–14.5)
WBC: 19.6 10*3/uL — AB (ref 3.6–11.0)

## 2013-10-11 LAB — WBC: WBC: 15.2 10*3/uL — ABNORMAL HIGH (ref 3.6–11.0)

## 2013-10-11 LAB — BASIC METABOLIC PANEL
Anion Gap: 7 (ref 7–16)
BUN: 21 mg/dL — AB (ref 7–18)
CALCIUM: 8.6 mg/dL (ref 8.5–10.1)
CO2: 25 mmol/L (ref 21–32)
Chloride: 106 mmol/L (ref 98–107)
Creatinine: 1.01 mg/dL (ref 0.60–1.30)
EGFR (African American): >60
EGFR (Non-African Amer.): 52 — ABNORMAL LOW
Glucose: 178 mg/dL — ABNORMAL HIGH (ref 65–99)
Osmolality: 283 (ref 275–301)
POTASSIUM: 3.9 mmol/L (ref 3.5–5.1)
SODIUM: 138 mmol/L (ref 136–145)

## 2013-10-11 LAB — PATHOLOGY REPORT

## 2013-10-11 LAB — MAGNESIUM: MAGNESIUM: 1.7 mg/dL — AB

## 2013-10-12 LAB — BASIC METABOLIC PANEL
Anion Gap: 10 (ref 7–16)
BUN: 16 mg/dL (ref 7–18)
CALCIUM: 8.7 mg/dL (ref 8.5–10.1)
CHLORIDE: 103 mmol/L (ref 98–107)
Co2: 24 mmol/L (ref 21–32)
Creatinine: 0.73 mg/dL (ref 0.60–1.30)
EGFR (African American): >60
EGFR (Non-African Amer.): >60
Glucose: 169 mg/dL — ABNORMAL HIGH (ref 65–99)
Osmolality: 279 (ref 275–301)
Potassium: 3.6 mmol/L (ref 3.5–5.1)
Sodium: 137 mmol/L (ref 136–145)

## 2013-10-12 LAB — CULTURE, BLOOD (SINGLE)

## 2013-10-12 LAB — WBC: WBC: 12.8 10*3/uL — ABNORMAL HIGH (ref 3.6–11.0)

## 2013-10-14 LAB — CBC WITH DIFFERENTIAL/PLATELET
Bands: 2 %
Basophil: 1 %
Eosinophil: 7 %
HCT: 30.8 % — ABNORMAL LOW (ref 35.0–47.0)
HGB: 9.7 g/dL — ABNORMAL LOW (ref 12.0–16.0)
Lymphocytes: 14 %
MCH: 25.1 pg — AB (ref 26.0–34.0)
MCHC: 31.4 g/dL — ABNORMAL LOW (ref 32.0–36.0)
MCV: 80 fL (ref 80–100)
METAMYELOCYTE: 2 %
MONOS PCT: 6 %
MYELOCYTE: 1 %
Platelet: 331 10*3/uL (ref 150–440)
RBC: 3.86 10*6/uL (ref 3.80–5.20)
RDW: 14.4 % (ref 11.5–14.5)
Segmented Neutrophils: 67 %
WBC: 15.4 10*3/uL — AB (ref 3.6–11.0)

## 2013-10-14 LAB — BASIC METABOLIC PANEL
Anion Gap: 8 (ref 7–16)
BUN: 15 mg/dL (ref 7–18)
CALCIUM: 8.3 mg/dL — AB (ref 8.5–10.1)
CO2: 22 mmol/L (ref 21–32)
Chloride: 108 mmol/L — ABNORMAL HIGH (ref 98–107)
Creatinine: 0.75 mg/dL (ref 0.60–1.30)
EGFR (African American): >60
EGFR (Non-African Amer.): >60
GLUCOSE: 153 mg/dL — AB (ref 65–99)
Osmolality: 280 (ref 275–301)
POTASSIUM: 3.9 mmol/L (ref 3.5–5.1)
Sodium: 138 mmol/L (ref 136–145)

## 2013-12-03 ENCOUNTER — Emergency Department: Payer: Self-pay | Admitting: Emergency Medicine

## 2013-12-03 LAB — CBC WITH DIFFERENTIAL/PLATELET
BASOS ABS: 0.1 10*3/uL (ref 0.0–0.1)
BASOS PCT: 0.9 %
EOS ABS: 0.8 10*3/uL — AB (ref 0.0–0.7)
Eosinophil %: 7.9 %
HCT: 38 % (ref 35.0–47.0)
HGB: 11.8 g/dL — ABNORMAL LOW (ref 12.0–16.0)
Lymphocyte #: 1.6 10*3/uL (ref 1.0–3.6)
Lymphocyte %: 15.7 %
MCH: 24.6 pg — AB (ref 26.0–34.0)
MCHC: 31 g/dL — AB (ref 32.0–36.0)
MCV: 79 fL — AB (ref 80–100)
MONOS PCT: 6.1 %
Monocyte #: 0.6 x10 3/mm (ref 0.2–0.9)
NEUTROS ABS: 7 10*3/uL — AB (ref 1.4–6.5)
Neutrophil %: 69.4 %
PLATELETS: 285 10*3/uL (ref 150–440)
RBC: 4.8 10*6/uL (ref 3.80–5.20)
RDW: 16.3 % — ABNORMAL HIGH (ref 11.5–14.5)
WBC: 10.1 10*3/uL (ref 3.6–11.0)

## 2013-12-03 LAB — BASIC METABOLIC PANEL
ANION GAP: 8 (ref 7–16)
BUN: 14 mg/dL (ref 7–18)
CHLORIDE: 105 mmol/L (ref 98–107)
CO2: 24 mmol/L (ref 21–32)
Calcium, Total: 9 mg/dL (ref 8.5–10.1)
Creatinine: 0.86 mg/dL (ref 0.60–1.30)
EGFR (African American): >60
EGFR (Non-African Amer.): >60
GLUCOSE: 219 mg/dL — AB (ref 65–99)
Osmolality: 281 (ref 275–301)
Potassium: 4.1 mmol/L (ref 3.5–5.1)
SODIUM: 137 mmol/L (ref 136–145)

## 2014-02-20 DIAGNOSIS — I2510 Atherosclerotic heart disease of native coronary artery without angina pectoris: Secondary | ICD-10-CM | POA: Insufficient documentation

## 2014-02-20 DIAGNOSIS — I480 Paroxysmal atrial fibrillation: Secondary | ICD-10-CM | POA: Insufficient documentation

## 2014-02-20 DIAGNOSIS — G939 Disorder of brain, unspecified: Secondary | ICD-10-CM | POA: Insufficient documentation

## 2014-02-20 DIAGNOSIS — I6782 Cerebral ischemia: Secondary | ICD-10-CM | POA: Insufficient documentation

## 2014-04-10 DIAGNOSIS — L309 Dermatitis, unspecified: Secondary | ICD-10-CM | POA: Insufficient documentation

## 2014-07-07 LAB — BASIC METABOLIC PANEL
BUN: 16 mg/dL (ref 4–21)
CREATININE: 0.9 mg/dL (ref 0.5–1.1)
Glucose: 144 mg/dL
Potassium: 5.1 mmol/L (ref 3.4–5.3)
SODIUM: 140 mmol/L (ref 137–147)

## 2014-07-07 LAB — TSH: TSH: 6.9 u[IU]/mL — AB (ref 0.41–5.90)

## 2014-07-07 LAB — HEPATIC FUNCTION PANEL
ALT: 17 U/L (ref 7–35)
AST: 20 U/L (ref 13–35)

## 2014-09-23 NOTE — Op Note (Signed)
PATIENT NAME:  Peggy [name], Peggy [name] MR#:  [id number] DATE OF BIRTH:  1928/07/19  DATE OF PROCEDURE:  10/08/2013  PREOPERATIVE DIAGNOSIS: Perforation of right colon.   POSTOPERATIVE DIAGNOSIS: Perforation of right colon.   PROCEDURE PERFORMED: Exploratory laparotomy, lysis of adhesions, right colectomy with primary anastomosis.   SURGEON: Natale Lay, MD  ASSISTANT: None.   ANESTHESIA: General endotracheal.   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 There was a minor amount of contamination of the right gutter.   SPECIMENS: Right colon and terminal ileum to pathology as well as portion of omentum.   DESCRIPTION OF PROCEDURE: With informed consent, supine position, general endotracheal anesthesia, Foley catheter was placed under sterile technique. The patient's abdomen was widely prepped and draped with ChloraPrep solution. Timeout was observed. Midline skin incision was fashioned. The peritoneum was opened sharply. Self-retaining abdominal wall retractor was placed. Adhesiolysis of the omentum down to the pelvis was undertaken with electrocautery and sharp dissection. Small loops of bowel were stuck to the vaginal cuff, which were taken down with sharp dissection. One small serosal tear was imbricated with 3-0 silk suture in the small bowel. The right colon was involved in an inflammatory process and the omentum was densely adherent to it. There was some feculent material in the right gutter. This was irrigated during the case with 2.5 liters of warm normal saline. The right colon and hepatic flexure were delivered along the avascular plane utilizing electrocautery and the application of the LigaSure along the larger vessels. The duodenal sweep was identified. A GIA 75 stapler was then used to transect the terminal ileum to a mesenteric window. Similarly, the transverse colon was transected through an uninvolved segment of inflamed colon with a second fire of the GIA 75 stapler. Intervening  mesentery was then sequentially scored, clamped, and divided utilizing the laparoscopic LigaSure device. Hemostasis appeared to be excellent. A side-to-side functional end-to-end anastomosis was then fashioned between the 2 limbs by aligning their seromuscular borders with 3-0 silk suture. The corner of each staple line was excised. GIA 75 staple was inserted. Common channel enterotomy was then created by firing the stapler. The end of the bowel was then closed with a TA 60 stapler and imbricated utilizing multiple 3-0 silk sutures. The anti-traction sutures were placed at the crotch of the anastomosis. A large mesenteric defect was then reapproximated with a running locking 3-0 chromic suture. With lap and needle count correct x 2, the abdomen again was irrigated as described above and the fascia was closed with a running number #1 PDS from the extremes of the wound. Subcutaneous tissues were irrigated, and a skin stapler was used to reapproximate skin edges. The patient was subsequently extubated and taken to the recovery room in stable and satisfactory condition by anesthesia services.    ____________________________ Peggy Gainer Egbert Garibaldi, MD mab:lt D: 10/09/2013 09:57:01 ET T: 10/09/2013 21:45:01 ET JOB#: [id number]  cc: Peggy Leriche A. Egbert Garibaldi, MD, <Dictator> Ezzard Standing. Bluford Kaufmann, MD Leo Grosser, MD  Monai Hindes Kela Millin MD ELECTRONICALLY SIGNED 10/10/2013 7:41

## 2014-09-23 NOTE — Consult Note (Signed)
Pt seen and examined. Please see Peggy [name]'s notes.Pt with hematochezia and diarrhea. LUQ abd pain as well. Known hx of hyperplastic polyps, sigmoid tics, and hepatic flexure AVM's. Pt would like to repeat colonoscopy while she is here. Will check for diverticular bleed. If AVM's are present and possible source of bleeding, will cauterize those area. Will also check for ischemic colitis, which can cause bleeding and abd pain. Thus, will prep patient tonight for colonoscopy in AM. thanks.  Electronic Signatures: Lutricia Feil (MD)  (Signed on 07-May-15 13:50)  Authored  Last Updated: 07-May-15 13:50 by Lutricia Feil (MD)

## 2014-09-23 NOTE — H&P (Signed)
PATIENT NAME:  Peggy [name], Peggy [name] MR#:  [id number] DATE OF BIRTH:  09-12-1928  DATE OF ADMISSION:  10/05/2013  REFERRING PHYSICIAN: McLaurin.   PRIMARY CARE PHYSICIAN: Elease Hashimoto.   CARDIOLOGIST: Paraschos.   CHIEF COMPLAINT: Chest pain.   HISTORY OF PRESENT ILLNESS: An 81 year old Caucasian female with history of coronary artery disease, hypertension, hypothyroidism, diabetes. Presented with chest pain. Describes chest pain acute onset at 4:30 p.m. at rest. Left chest pain located over midaxillary line which is sharp in quality, nonradiating, intensity 7 to 8 out of 10. No worsening or relieving factors. No associated nausea; however, did have subjective shortness of breath. She is also complaining of diarrhea for 1 week's duration which has been intermittent though worsened today, without associated nausea or vomiting. She did have red blood per rectum which is worse today than her prior episodes. Currently no active complaints.   REVIEW OF SYSTEMS:   CONSTITUTIONAL: Denies fever, fatigue, weakness.  EYES: Denies blurred vision, double vision, eye pain.  EARS, NOSE, THROAT: Denies tinnitus, ear pain, hearing loss.  RESPIRATORY: Denies cough, wheeze, shortness of breath.  CARDIOVASCULAR: Positive for chest pain as described above. Denies any palpitations or edema.  GASTROINTESTINAL: Denies nausea, vomiting,  Positive for hematochezia as described above.  GENITOURINARY: Denies dysuria, hematuria.  ENDOCRINE: Denies nocturia or thyroid problems.  HEMATOLOGIC AND LYMPHATIC: Denies easy bruising or bleeding.  SKIN: Denies rash or lesions.  MUSCULOSKELETAL: Denies pain in neck, back, shoulder, knees, hips or arthritic symptoms.  NEUROLOGIC: Denies paralysis, paresthesias.  PSYCHIATRIC: Denies anxiety or depressive symptoms.   Otherwise, full review of systems performed by me is negative.   PAST MEDICAL HISTORY: Coronary artery disease, hypertension, hypothyroidism, diabetes.   SOCIAL HISTORY:  Remote tobacco use. Denies any alcohol or drug usage.   FAMILY HISTORY: Positive for pancreatic cancer.   ALLERGIES: BENADRYL, CIPRO, GEMFIBROZIL, METFORMIN, ONGLYZA, PENICILLIN, AS WELL AS XARELTO. Marland Kitchen   HOME MEDICATIONS: Acetaminophen/hydrocodone 325/5 mg p.o. q.8 hours as needed for pain, aspirin 81 mg p.o. daily, lisinopril 20 mg p.o. daily, gabapentin 100 mg p.o. 3 times daily, Lantus 35 units daily, hydroxyzine 25 mg p.o. b.i.d., Coreg 12.5 mg p.o. once daily, Norvasc 5 mg daily, Synthroid 50 mcg daily, vitamin B12 1000 mcg p.o. daily.   PHYSICAL EXAMINATION:  VITAL SIGNS: Temperature 97.8, heart rate 80, respirations 18, blood pressure 151/66, saturating 99% on room air. Weight 74.4 kg, BMI 30.  GENERAL: Well-nourished, well-developed, Caucasian female, currently in no acute distress.  HEAD: Normocephalic, atraumatic.  EYES: Pupils equal, round and reactive to light. Extraocular movements intact. No scleral icterus.  MOUTH: Moist mucosal membranes. Dentition intact. No abscess noted.  EARS, NOSE, THROAT: Throat is clear without exudates. No external lesions.  NECK: Supple. No thyromegaly. No nodules. No JVD.  PULMONARY: Clear to auscultation bilaterally without wheezes, rales or rhonchi. No use of accessory muscles. Good respiratory effort.  CHEST: Nontender to palpation.  CARDIOVASCULAR: S1,S2  rate and rhythm with no murmurs, rubs or gallops. no edema . Pedal pulses 2+ bilaterally.  GASTROINTESTINAL: Soft, nontender, nondistended. No masses. Positive bowel sounds. No hepatosplenomegaly.   MUSCULOSKELETAL: No swelling, clubbing, edema. Range of motion full in all extremities.  NEUROLOGIC: Cranial nerves II through XII intact. No gross focal neurological deficits. Sensation intact. Reflexes intact.  SKIN: No ulcerations, lesions, rash, cyanosis. Skin warm, dry. Turgor intact.  PSYCHIATRIC: Mood and affect within normal limits. The patient is awake, alert and oriented x 3. Insight and  judgment intact.   LABORATORY DATA: EKG: Normal  sinus rhythm. No ST or T wave abnormalities. Sodium 138, potassium 4.5, chloride 107, bicarb 20. Anion gap of 11. BUN 19, creatinine 0.85, glucose 196. Troponin I less than 0.02. WBC 9.8, hemoglobin 11.4, platelets 280. Chest x-ray performed. No active cardiopulmonary disease.   ASSESSMENT AND PLAN: An 81 year old female with history of coronary artery disease, hypothyroidism, presenting with chest pain.  1. Chest pain: Admit to observation telemetry. Trend cardiac enzymes x 3. She received her aspirin . She follows with Dr. Darrold Junker. Will consult if enzymes elevated. Will also place her on statin therapy.  2. Bright red blood per rectum: Trend CBCs q.6 hours. Avoid further anticoagulation. Will consult gastroenterology.  3. Diabetes: While she is n.p.o., will half her dose of Lantus which will be 15 units. Provide insulin sliding scale with meals when she is taking p.o.  4. Hypothyroidism: Continue home dose of Synthroid.  5. Venous thromboembolism prophylaxis with sequential compression devices.   The patient is FULL CODE.   TIME SPENT: 45 minutes.   ____________________________ Cletis Athens. Hower, MD dkh:gb D: 10/06/2013 01:13:26 ET T: 10/06/2013 02:07:37 ET JOB#: [id number]  cc: Cletis Athens. Hower, MD, <Dictator> DAVID Synetta Shadow MD ELECTRONICALLY SIGNED 10/07/2013 1:02

## 2014-09-23 NOTE — Consult Note (Signed)
Events of Friday noted. I was never notified of what happened. When I talked to nurse that afternoon, patient had questions about her chronic left sided abd pain, which I was willing to follow as outpt and consider EGD later.. According to patient, severe right sided pain started that evening at the cecal cautery site.Cauterization was not extensive. Suspect delayed reaction.  Right now, patient still has some surgical pain. Tolerating liquids.Having some diarrhea now, which may be a sign of resolution of postop ileus. Surgery to decide on advancing diet and discharge. Will follow along. thanks.  Electronic Signatures: Lutricia Feil (MD)  (Signed on 13-May-15 14:15)  Authored  Last Updated: 13-May-15 14:15 by Lutricia Feil (MD)

## 2014-09-23 NOTE — Consult Note (Signed)
No further bleeding. Some diarrhea. Colon showed left sided tics and 2 AVM's in ceceum/hepatic flexure. No bleeding. AVM's cauterized. Low residue diet. If tolerates solids, ok for discharge from GI point of view. If more bleeding, then have GI on call se patient over the weeekend. Thanks.  Electronic Signatures: Lutricia Feil (MD)  (Signed on 08-May-15 11:25)  Authored  Last Updated: 08-May-15 11:25 by Lutricia Feil (MD)

## 2014-09-23 NOTE — Discharge Summary (Signed)
PATIENT NAME:  Peggy [name], ADWELL MR#:  [id number] DATE OF BIRTH:  November 17, 1928  DATE OF ADMISSION:  10/06/2013 DATE OF DISCHARGE:  10/14/2013  DISCHARGE DIAGNOSES:  1. Iatrogenic colonic perforation.  2. Hypothyroidism.  3. Hypertension.  4. Diabetes mellitus type 2.  5. Neuropathy.  6. Sepsis secondary to peritonitis due to bowel perforation, treated.  7. Coronary artery disease with history of stents.   CONSULTATIONS:  1. Surgical consult with Dr. Anda Kraft. 2. GI consult with Dr. Bluford Kaufmann.   DISCHARGE MEDICATIONS:  1. Levothyroxine 50 mcg p.o. daily.  2. Hydroxyzine hydrochloride 25 mg p.o. b.i.d. 3. Coreg 12.5 mg p.o. daily.  4. Acetaminophen with hydrocodone 325/5 one tablet every 8 hours.  5. Neurontin 100 mg p.o. 1 capsule in the morning, 1 capsule in the afternoon and 2 capsules at bedtime.  6. Amlodipine 5 mg daily.  7. Lisinopril 20 mg daily. 8. Lantus 20 units at bedtime.  9. Skelaxin 800 mg p.o. t.i.d.  10. Fish oil 1 gram p.o. t.i.d.   HOSPITAL COURSE: An 81 year old female patient admitted to medicine on May 6th because of chest pain. The patient was admitted for chest pain rule-out. Troponins have been negative. The patient had sudden onset of chest pain, and the patient had a CT chest for pulmonary emboli. It was negative. The patient also had a colonoscopy because she had some rectal bleeding when she came.   1. Chest pain, thought to be musculoskeletal origin. The patient had a history of coronary artery disease and stent placement, so she was observed for chest pain rule-out and had a work-up that did not show any acute MI. EKG showed normal sinus rhythm, 86 beats per minute. The patient complained of rectal bleeding.  2. Rectal bleeding. The patient complained of rectal bleeding. The patient was seen by GI, and her hemoglobin was stable around 10.8. Because of rectal bleeding, the patient was taken to colonoscopy, and she had a colonoscopy on May 8th. Her colonoscopy showed  diverticulosis of the colon, and nonbleeding colonic angiodysplastic lesion was found, and the patient also had a nonbleeding colonic angiodysplasia for which she was treated with thermal therapy. The patient was referred for discharge home by Dr. Bluford Kaufmann, but the patient developed abdominal pain after the colonoscopy. The patient was about to be discharged on May 8th, but because of severe abdominal pain, discharge was held. The patient's abdominal x-ray showed perforation, and the patient had a KUB done, and it showed gas in the mid abdominal bowel loops. The patient continued to have persistent abdominal pain, so the patient had a CAT scan on May 9th which showed diverticulitis with sigmoid colon perforation. The patient had an emergency ex lap with lysis of adhesions and right colectomy by Dr. Egbert Garibaldi.. The patient had a colectomy. The patient had exploratory laparotomy with lysis of adhesions and right colectomy, primary anastomosis. Postoperatively, the patient had abdominal pain, and she had NG tube placed. The patient developed sepsis secondary to peritonitis from bowel perforation, and blood cultures have been negative. She received Invanz, and the patient was started on antibiotics. Seen by surgery on a regular basis, and the patient white count went up to as high as 30.5, and lactic acid was 2.4 on May 9th. So, the patient improved nicely after surgery. Her white count improved, leukocytosis resolved, and kidney function also improved. The patient's sepsis thought to be secondary to peritonitis, received Invanz, and the patient started on clear liquids, and diet was advanced gradually. She also was  seen with physical therapy. They recommended her to discharge home with home physical therapy. The patient seen by Dr. Anda Kraft on May 15th, and she was discharged by surgery, and the patient went home in stable condition. The patient received antibiotics, 7 days of IV antibiotics.   CONDITION: Stable.   PHYSICAL  EXAMINATION ON THE DAY OF DISCHARGE:  VITAL SIGNS: Temperature 97.4, heart rate 76, blood pressure 138/67, saturation 99% on room air.  GENERAL: She was alert, oriented, not in distress, answering questions appropriately.  ABDOMEN: Soft, nontender, nondistended.  LUNG: Clear. No wheeze. No rales.  NEUROLOGIC: The patient was oriented to time, place, person.  Discussed the plan with the patient's daughters, and the patient was discharged home.   TIME SPENT ON DISCHARGE PREPARATION: More than 30 minutes.     ____________________________ Katha Hamming, MD sk:lb D: 10/26/2013 09:50:23 ET T: 10/26/2013 10:06:43 ET JOB#: [id number]  cc: Katha Hamming, MD, <Dictator> Katha Hamming MD ELECTRONICALLY SIGNED 10/27/2013 12:20

## 2014-09-23 NOTE — Consult Note (Signed)
PATIENT NAME:  Peggy [name], Peggy [name] MR#:  [id number] DATE OF BIRTH:  01/25/1929  DATE OF CONSULTATION:  10/06/2013  PRIMARY CARE DOCTOR:  Lorie Phenix, MD  CONSULTING PHYSICIAN:  Rodman Key, NP/ Lutricia Feil, MD.  PRIMARY CARDIOLOGIST:  Marcina Millard, MD.  ATTENDING PHYSICIAN:  Dr. Clint Guy.  REASON FOR CONSULTATION:  Hematochezia.   HISTORY OF PRESENT ILLNESS:  Ms. Frost is an 81 year old Caucasian female who has a significant past medical history of coronary artery disease, hypertension, hypothyroidism and diabetes. She presented with chest pain which she states is more left upper quadrant under the anterior aspect of the rib cage, the pain, which is acute in nature, which started yesterday at 4:00 p.m., states that she had eaten pizza for lunch and then the onset of pain described as a sharp pain, but it is not constant, movement does makes it worse. Food does not aggravate or alleviate it nor does defecation. No nausea. No vomiting. No similar episodes in the past, no acid reflux. Taking a deep breath also does not seem to make the pain better or worse. She denies having a cough or any trauma to this area. It will go through to her back in radiation. After the onset of pain, she started to experience diarrhea. Yesterday her bowels moved 7 times. Bright-red blood did present, but did not occur with every movement. The last episode of rectal bleeding was yesterday at 9:30 p.m. Weight has been stable. No recent NSAID use.   Review of EMR, she has had at least 4 colonoscopies performed in the past. First with Dr. Adelene Idler in 1998, with 1 polyp being resected at that time. Blood clots were noted in the cecum and the proximal descending colon. AVMs noted in the cecum. Unable to locate pathology report of polypectomy at that time. In 1999, a colonoscopy was done with nonspecific, scattered inflammatory spots in the sigmoid colon. In 2001, colonoscopy was performed by Dr. Lynnae Prude with a few small  localized AVMs being found in the ascending and transverse colon. Vaporization for tissue destruction using argon beam was successful. Internal hemorrhoids noted. Colonoscopy was done in 2008 by Dr. Lynnae Prude with 1 polyp being resected from the rectum, diverticulosis noted within the sigmoid colon, multiple small AVMs with no bleeding was found in the hepatic flexure. A coagulation for tissue destruction using argon plasma at 1.0 L/minute and 48 watts was successful. Internal hemorrhoids noted.   The patient has not had any shortness of breath, some dizziness yesterday evening after excessive number of bowel movements. Found to have elevation in glucose on admission at 196, BUN of 19, troponin level 3 in series has been less than 0.02. CK-MB was 2.9, the second in series with an elevation to 3.4 and the third. Hemoglobin 11.4 on admission and currently 11.2. Chest x-ray, PA and lateral, no acute a disease process noted.   REVIEW OF SYSTEMS: CONSTITUTIONAL:  Denies any fevers, fatigue, weakness. Significant for dizziness.  EYES:  No blurred vision, double vision, pain.  EARS, NOSE AND THROAT:  No tinnitus, ear pain, hearing loss.  RESPIRATORY:  No coughing, wheezing, shortness of breath.  CARDIOVASCULAR:  Positive for chest pain but does described it more as right upper quadrant underneath the anterior rib cage area discomfort. No heart palpitations or edema.  GASTROINTESTINAL: See HPI.  GENITOURINARY:  No dysuria or hematuria.  ENDOCRINE:  No nocturia, thyroid problems.  HEMATOLOGIC AND LYMPHATIC:  Denies significant easy bruising and bleeding.  SKIN:  No rashes.  No lesions.  MUSCULOSKELETAL:  No neuralgias or myalgias.  NEUROLOGIC:  No history of CVA or TIA or seizure disorder.  PSYCHIATRIC:  No depression. No anxiety.   Otherwise a full review of systems performed by myself is noted to be unremarkable.   PAST MEDICAL HISTORY:  Coronary artery disease with 1 stent placed at least 5 years  ago, hypertension, hypothyroidism, spinal stenosis, diabetes, also AVMs noted multiple times on colonic endoscopic evaluation.  PAST SURGICAL HISTORY:  Back surgery, complete hysterectomy and then 4 colonoscopies in her lifetime, dates as noted under history.   FAMILY HISTORY:  Father with pancreatitis, mother breast cancer diagnosed at 29, deceased at 49, brother with history of melanoma, sister breast cancer diagnosed at 78 living, brother with history of lung cancer. No family history of colon cancer or colonic polyps.   ALLERGIES:  BENADRYL, CIPRO, GEMFIBROZIL, METFORMIN, ONGLYZA, PENICILLIN AND  XARELTO .  HOME MEDICATIONS:  Acetaminophen with codeine 325/5 mg 1 tablet every 8 hours as needed for pain, aspirin 81 mg a day, lisinopril 20 mg daily, gabapentin 100 mg 3 times a day, Lantus is 35 units daily, hydralazine at 25 mg twice a day, Coreg is 12.5 mg daily, Norvasc is 5 mg a day and Synthroid is 50 mcg daily, vitamin B12 1000 mcg once a day, vitamin D 1000 international units once a day and Co-Q10 once a day. Multivitamin 1 tablet daily.  PHYSICAL EXAMINATION: VITAL SIGNS:  Temperature is 97.5 with a pulse of 62, respirations are 16, blood pressure is 110/53, and pulse ox is 96% on room air.  GENERAL:  Well-developed, well-nourished, 81 year old Caucasian female, no acute distress noted. Pleasant.  HEENT:  Normocephalic, atraumatic. Pupils equal and reactive to light. Conjunctivae clear. Sclerae anicteric.  NECK:  Supple. Trachea midline. No lymphadenopathy or thyromegaly.  PULMONARY:  Symmetric rise and fall of chest. Clear to auscultation throughout.  CARDIOVASCULAR:  Regular rate and rhythm, S1, S2. No murmurs, no gallops.  ABDOMEN:  Soft, nondistended. Bowel sounds in 4 quadrants. Mild discomfort noted to left upper quadrant. No bruits. No masses. No evidence of hepatosplenomegaly.  RECTAL:  Deferred.  MUSCULOSKELETAL:  Moving all 4 extremities. No contractures. No clubbing.   EXTREMITIES:  No edema.  PSYCHIATRIC:  Alert and oriented x 4. Memory grossly intact. Appropriate affect and mood. Daughter present.  NEUROLOGICAL:  No gross neurological deficits.   LABORATORY, DIAGNOSTIC, AND RADIOLOGICAL DATA:  Laboratory studies from admission date to today's date, reviewed by myself, results as noted under history. EKG is normal sinus rhythm. A chest x-ray:  Results as stated.   IMPRESSION:  Hematochezia. Known history of AVMs, a personal history of colonic polyps. Left upper quadrant abdominal pain. Chest pain with unremarkable evaluation thus far. A known history of coronary artery disease. Diabetes.   PLAN:  The patient's presentation was discussed with Dr. Lutricia Feil. We will advance the patient's diet to a clear liquid diet today. NPO after midnight. We will proceed forward with a diagnostic colonoscopy tomorrow to allow direct luminal evaluation of colon to assess for a source of active blood loss. Increased risks for blood loss source being related to AVMs and may require intervention such as argon plasma. Procedure risks versus benefits discussed with the patient. Order placed. It will be done by Dr. Lutricia Feil. We will continue to monitor the patient at this time.  Do recommend continue monitoring of hemoglobin. Transfuse as necessary.   These services provided by Rodman Key, MS, APRN, [facility], FNP, under  collaborative agreement with Dr. Lutricia Feil.    ____________________________ Rodman Key, NP dsh:jm D: 10/06/2013 14:11:04 ET T: 10/06/2013 17:01:29 ET JOB#: [id number]  cc: Rodman Key, NP, <Dictator> Rodman Key MD ELECTRONICALLY SIGNED 10/12/2013 16:47

## 2014-11-15 DIAGNOSIS — L409 Psoriasis, unspecified: Secondary | ICD-10-CM | POA: Insufficient documentation

## 2014-11-15 DIAGNOSIS — K5720 Diverticulitis of large intestine with perforation and abscess without bleeding: Secondary | ICD-10-CM | POA: Insufficient documentation

## 2014-11-15 DIAGNOSIS — Z6828 Body mass index (BMI) 28.0-28.9, adult: Secondary | ICD-10-CM | POA: Insufficient documentation

## 2014-11-15 DIAGNOSIS — M1990 Unspecified osteoarthritis, unspecified site: Secondary | ICD-10-CM | POA: Insufficient documentation

## 2014-11-15 DIAGNOSIS — E039 Hypothyroidism, unspecified: Secondary | ICD-10-CM | POA: Insufficient documentation

## 2014-11-15 DIAGNOSIS — L299 Pruritus, unspecified: Secondary | ICD-10-CM | POA: Insufficient documentation

## 2014-11-15 DIAGNOSIS — E7800 Pure hypercholesterolemia, unspecified: Secondary | ICD-10-CM | POA: Insufficient documentation

## 2014-11-15 DIAGNOSIS — I2510 Atherosclerotic heart disease of native coronary artery without angina pectoris: Secondary | ICD-10-CM | POA: Insufficient documentation

## 2014-11-15 DIAGNOSIS — E1169 Type 2 diabetes mellitus with other specified complication: Secondary | ICD-10-CM | POA: Insufficient documentation

## 2014-11-15 DIAGNOSIS — R252 Cramp and spasm: Secondary | ICD-10-CM | POA: Insufficient documentation

## 2014-11-15 DIAGNOSIS — I10 Essential (primary) hypertension: Secondary | ICD-10-CM | POA: Insufficient documentation

## 2014-11-15 DIAGNOSIS — G4700 Insomnia, unspecified: Secondary | ICD-10-CM | POA: Insufficient documentation

## 2014-11-15 DIAGNOSIS — K219 Gastro-esophageal reflux disease without esophagitis: Secondary | ICD-10-CM | POA: Insufficient documentation

## 2014-11-15 DIAGNOSIS — E119 Type 2 diabetes mellitus without complications: Secondary | ICD-10-CM | POA: Insufficient documentation

## 2014-11-15 DIAGNOSIS — G8929 Other chronic pain: Secondary | ICD-10-CM | POA: Insufficient documentation

## 2014-11-15 DIAGNOSIS — J309 Allergic rhinitis, unspecified: Secondary | ICD-10-CM | POA: Insufficient documentation

## 2014-11-15 DIAGNOSIS — G2581 Restless legs syndrome: Secondary | ICD-10-CM | POA: Insufficient documentation

## 2014-11-15 DIAGNOSIS — R0602 Shortness of breath: Secondary | ICD-10-CM | POA: Insufficient documentation

## 2014-11-16 ENCOUNTER — Ambulatory Visit (INDEPENDENT_AMBULATORY_CARE_PROVIDER_SITE_OTHER): Payer: Medicare Other | Admitting: Family Medicine

## 2014-11-16 ENCOUNTER — Encounter: Payer: Self-pay | Admitting: Family Medicine

## 2014-11-16 VITALS — BP 120/68 | HR 88 | Temp 98.2°F | Resp 16 | Wt 158.0 lb

## 2014-11-16 DIAGNOSIS — E7800 Pure hypercholesterolemia, unspecified: Secondary | ICD-10-CM

## 2014-11-16 DIAGNOSIS — E119 Type 2 diabetes mellitus without complications: Secondary | ICD-10-CM

## 2014-11-16 DIAGNOSIS — I10 Essential (primary) hypertension: Secondary | ICD-10-CM

## 2014-11-16 DIAGNOSIS — E039 Hypothyroidism, unspecified: Secondary | ICD-10-CM

## 2014-11-16 DIAGNOSIS — M545 Low back pain: Secondary | ICD-10-CM | POA: Diagnosis not present

## 2014-11-16 DIAGNOSIS — G8929 Other chronic pain: Secondary | ICD-10-CM | POA: Diagnosis not present

## 2014-11-16 DIAGNOSIS — G4700 Insomnia, unspecified: Secondary | ICD-10-CM | POA: Diagnosis not present

## 2014-11-16 DIAGNOSIS — I219 Acute myocardial infarction, unspecified: Secondary | ICD-10-CM | POA: Insufficient documentation

## 2014-11-16 DIAGNOSIS — I639 Cerebral infarction, unspecified: Secondary | ICD-10-CM | POA: Insufficient documentation

## 2014-11-16 DIAGNOSIS — R21 Rash and other nonspecific skin eruption: Secondary | ICD-10-CM

## 2014-11-16 DIAGNOSIS — L299 Pruritus, unspecified: Secondary | ICD-10-CM | POA: Diagnosis not present

## 2014-11-16 LAB — POCT GLYCOSYLATED HEMOGLOBIN (HGB A1C): Hemoglobin A1C: 7.5

## 2014-11-16 MED ORDER — HYDROCODONE-ACETAMINOPHEN 5-325 MG PO TABS: 1.0000 | ORAL_TABLET | Freq: Two times a day (BID) | ORAL | Status: DC

## 2014-11-16 MED ORDER — HYDROXYZINE HCL 25 MG PO TABS: 25.0000 mg | ORAL_TABLET | Freq: Two times a day (BID) | ORAL | Status: DC | PRN

## 2014-11-16 NOTE — Progress Notes (Signed)
Subjective:    Patient ID: Peggy [name], female    DOB: 1929-04-13, 81 y.o.   MRN: [id number]  Diabetes She presents for her follow-up (Last office visit (07/07/2014) Pt Stopped Metformin secondary to Diarrhea, and increased Glipizide to 5mg  a day.) diabetic visit. She has type 2 diabetes mellitus. Her disease course has been stable (Last A1C 07/07/2014 6.9%.). There are no hypoglycemic associated symptoms. Pertinent negatives for hypoglycemia include no dizziness, headaches, pallor, seizures, speech difficulty, sweats or tremors. There are no diabetic associated symptoms. Pertinent negatives for diabetes include no blurred vision, no chest pain, no fatigue, no polydipsia, no polyphagia, no polyuria and no weakness. Symptoms are stable. Risk factors for coronary artery disease include hypertension, dyslipidemia and diabetes mellitus. Current diabetic treatment includes oral agent (monotherapy). She is compliant with treatment all of the time. Her weight is stable. Her home blood glucose trend is increasing steadily. Her overall blood glucose range is 140-180 mg/dl. Eye exam is current.  Hypertension This is a chronic problem. The problem is controlled. Pertinent negatives include no anxiety, blurred vision, chest pain, headaches, neck pain, orthopnea, palpitations, shortness of breath or sweats. Risk factors for coronary artery disease include diabetes mellitus and dyslipidemia. There are no compliance problems.   Hyperlipidemia This is a chronic problem. The problem is uncontrolled. Recent lipid tests were reviewed and are high (Last check 07/05/2013 Total Cholesterol:  260;  Tri:  567;  HDL:  37.). Exacerbating diseases include diabetes and hypothyroidism. Pertinent negatives include no chest pain, myalgias or shortness of breath. She is currently on no antihyperlipidemic treatment (Pt reported statins causes bad leg cramps. ). Risk factors for coronary artery disease include diabetes mellitus,  dyslipidemia and hypertension.  Rash This is a chronic problem. The problem is unchanged. The rash is diffuse. The rash is characterized by itchiness. She was exposed to nothing. Pertinent negatives include no cough, diarrhea, fatigue, fever, shortness of breath or vomiting.  Hypothyroidism: Patient presents for evaluation of thyroid function. Symptoms consist of denies fatigue, weight changes, heat/cold intolerance, bowel/skin changes or CVS symptoms.  The problem has been stable.  Previous thyroid studies include TSH. The hypothyroidism is due to hypothyroidism.   Patient Active Problem List   Diagnosis Date Noted  . Cerebral vascular accident 11/16/2014  . Heart attack 11/16/2014  . Chronic low back pain 11/16/2014  . Adult hypothyroidism 11/15/2014  . Allergic rhinitis 11/15/2014  . Body mass index (BMI) of 28.0-28.9 in adult 11/15/2014  . Arthritis 11/15/2014  . Atherosclerosis of coronary artery 11/15/2014  . Chronic pain 11/15/2014  . Cramps of lower extremity 11/15/2014  . Diverticulitis of colon with perforation 11/15/2014  . Essential (primary) hypertension 11/15/2014  . Acid reflux 11/15/2014  . Hypercholesteremia 11/15/2014  . Cannot sleep 11/15/2014  . Psoriasis 11/15/2014  . Itch of skin 11/15/2014  . Restless leg 11/15/2014  . Diabetes mellitus, type 2 11/15/2014  . Breath shortness 11/15/2014  . Arteriosclerosis of coronary artery 02/20/2014  . AF (paroxysmal atrial fibrillation) 02/20/2014  . Temporary cerebral vascular dysfunction 02/20/2014  . DD (diverticular disease) 10/05/2013   Family History  Problem Relation Age of Onset  . Breast cancer Mother   . Pancreatitis Father   . Breast cancer Sister   . Lung cancer Brother   . Melanoma Brother   . Throat cancer Brother    History   Social History  . Marital Status: Widowed    Spouse Name: N/A  . Number of Children: 3  . Years  of Education: H/S   Occupational History  . Retired    Social History  Main Topics  . Smoking status: Former Smoker -- 1.00 packs/day for 30 years    Quit date: 11/30/1999  . Smokeless tobacco: Never Used  . Alcohol Use: No  . Drug Use: No  . Sexual Activity: Not on file   Other Topics Concern  . Not on file   Social History Narrative   Past Surgical History  Procedure Laterality Date  . Colectomy Right 10/08/2013    Dr. Egbert Garibaldi  . Back surgery  2013  . Abdominal hysterectomy  1970  . Coronary angioplasty with stent placement     Allergies  Allergen Reactions  . Diphenhydramine Rash    AGITATION/DELIRIUM  . Metformin Diarrhea  . Amlodipine Rash  . Ciprofloxacin Rash  . Penicillins Rash  . Saxagliptin Rash   Current Outpatient Prescriptions on File Prior to Visit  Medication Sig Dispense Refill  . aspirin 81 MG tablet Take by mouth.    . Cetirizine HCl (ZYRTEC ALLERGY) 10 MG CAPS Take by mouth.    . gabapentin (NEURONTIN) 300 MG capsule Take by mouth.    Marland Kitchen glipiZIDE (GLUCOTROL XL) 5 MG 24 hr tablet Take by mouth.    . levothyroxine (SYNTHROID, LEVOTHROID) 50 MCG tablet Take by mouth.    . metoprolol succinate (TOPROL-XL) 50 MG 24 hr tablet Take by mouth.     No current facility-administered medications on file prior to visit.   BP 120/68 mmHg  Pulse 88  Temp(Src) 98.2 F (36.8 C) (Oral)  Resp 16  Wt 158 lb (71.668 kg)      Review of Systems  Constitutional: Negative for fever, chills, diaphoresis, activity change, appetite change, fatigue and unexpected weight change.  Eyes: Negative for blurred vision.  Respiratory: Negative for apnea, cough, choking, chest tightness, shortness of breath, wheezing and stridor.   Cardiovascular: Negative for chest pain, palpitations, orthopnea and leg swelling.  Gastrointestinal: Negative for nausea, vomiting, abdominal pain, diarrhea, constipation, blood in stool, abdominal distention, anal bleeding and rectal pain.  Endocrine: Negative for cold intolerance, heat intolerance, polydipsia, polyphagia  and polyuria.  Musculoskeletal: Negative for myalgias, back pain, joint swelling, arthralgias, gait problem, neck pain and neck stiffness.  Skin: Positive for rash. Negative for color change, pallor and wound.  Neurological: Negative for dizziness, tremors, seizures, syncope, facial asymmetry, speech difficulty, weakness, light-headedness, numbness and headaches.       Objective:   Physical Exam  Constitutional: She is oriented to person, place, and time. She appears well-developed and well-nourished.  Neurological: She is alert and oriented to person, place, and time.  Skin:  Hives on face noted.   Psychiatric: She has a normal mood and affect. Judgment normal.          Assessment & Plan:   1. Essential (primary) hypertension D/C Lisinopril as below.  Advised pt to monitor blood pressure at home,  If blood pressures start running 140/90's or higher she will have to come in sooner than one month to readdress.   - CBC with Differential/Platelet  2. Hypothyroidism, unspecified hypothyroidism type Pt is going to D/C Levothyroxine as below.  Will check labs today.  - TSH  3. Type 2 diabetes mellitus without complication A1C Slightly elevated at 7.5%.  No changes in medications for now secondary to a flare with Chronic Pruritus.  Will readdress in one month.   - POCT HgB A1C - Comprehensive metabolic panel  4. Hypercholesteremia Decided to hold  off on checking her Lipid panel for one month secondary to an acute flare with her Chronic Pruritis.    5. Rash Treat as below.  6. Chronic low back pain Stable, Refills provided.   - Hydrocodone-acetaminophen (NOR CO/VICODIN) 5-325 MG per tablet; Take 1 tablet by mouth 2 (two) times daily. As needed for chronic low back pain  Dispense: 60 tablet; Refill: 0  7. Chronic pruritus Ongoing issue, pt has been worked up by Dermatology.  Dr. Adolphus Birchwood suggested trying to stop Lisinopril and Levothyroxine to see if that helps with her rash.   Pt reports good symptom relief with Hydroxyzine, will try and get a PA.  She is not able to take prednisone secondary to bad side effects.  Will recheck in one month.  - hydroxyzine (ATARAX/VISTARIL) 25 MG tablet; Take 1 tablet (25 mg total) by mouth 2 (two) times daily as needed.  Dispense: 60 tablet; Refill: 5  8. Cannot sleep Pt reports improvement when she restarted Hydroxyzine.  Will hold off on Doxepin for now; but will reconsider if not able to get the insurance to pay for Hydroxyzine.    Patient was seen and examined by Leo Grosser, MD, and note scribed by Kavin Leech, CMA.   I have reviewed the document for accuracy and completeness and I agree with above. Leo Grosser, MD   Lorie Phenix, MD

## 2014-11-17 ENCOUNTER — Telehealth: Payer: Self-pay

## 2014-11-17 LAB — COMPREHENSIVE METABOLIC PANEL
A/G RATIO: 1.7 (ref 1.1–2.5)
ALT: 26 IU/L (ref 0–32)
AST: 21 IU/L (ref 0–40)
Albumin: 4.3 g/dL (ref 3.5–4.7)
Alkaline Phosphatase: 67 IU/L (ref 39–117)
BUN/Creatinine Ratio: 24 (ref 11–26)
BUN: 22 mg/dL (ref 8–27)
Bilirubin Total: 0.4 mg/dL (ref 0.0–1.2)
CALCIUM: 10.4 mg/dL — AB (ref 8.7–10.3)
CO2: 23 mmol/L (ref 18–29)
Chloride: 103 mmol/L (ref 97–108)
Creatinine, Ser: 0.91 mg/dL (ref 0.57–1.00)
GFR calc Af Amer: 67 mL/min/{1.73_m2} (ref >59)
GFR calc non Af Amer: 59 mL/min/{1.73_m2} — ABNORMAL LOW (ref >59)
Globulin, Total: 2.6 g/dL (ref 1.5–4.5)
Glucose: 180 mg/dL — ABNORMAL HIGH (ref 65–99)
POTASSIUM: 5.3 mmol/L — AB (ref 3.5–5.2)
SODIUM: 144 mmol/L (ref 134–144)
Total Protein: 6.9 g/dL (ref 6.0–8.5)

## 2014-11-17 LAB — CBC WITH DIFFERENTIAL/PLATELET
Basophils Absolute: 0.1 10*3/uL (ref 0.0–0.2)
Basos: 1 %
EOS (ABSOLUTE): 0.9 10*3/uL — AB (ref 0.0–0.4)
EOS: 8 %
Hematocrit: 43.5 % (ref 34.0–46.6)
Hemoglobin: 14.1 g/dL (ref 11.1–15.9)
IMMATURE GRANS (ABS): 0 10*3/uL (ref 0.0–0.1)
Immature Granulocytes: 0 %
LYMPHS ABS: 1.8 10*3/uL (ref 0.7–3.1)
Lymphs: 16 %
MCH: 27.7 pg (ref 26.6–33.0)
MCHC: 32.4 g/dL (ref 31.5–35.7)
MCV: 86 fL (ref 79–97)
MONOS ABS: 0.7 10*3/uL (ref 0.1–0.9)
Monocytes: 7 %
Neutrophils Absolute: 7.6 10*3/uL — ABNORMAL HIGH (ref 1.4–7.0)
Neutrophils: 68 %
PLATELETS: 273 10*3/uL (ref 150–379)
RBC: 5.09 x10E6/uL (ref 3.77–5.28)
RDW: 14.4 % (ref 12.3–15.4)
WBC: 11.2 10*3/uL — ABNORMAL HIGH (ref 3.4–10.8)

## 2014-11-17 LAB — TSH: TSH: 1.42 u[IU]/mL (ref 0.450–4.500)

## 2014-11-17 NOTE — Telephone Encounter (Signed)
-----  Message from Margarita Rana, MD sent at 11/17/2014 12:43 PM EDT ----- Labs ok except for very mildly elevated WBC count and potassium. May improve with med changes. Recommend recheck met c and cbc in 2 weeks. Follow up sooner if any fevers or systemic symptoms. Thanks.

## 2014-11-17 NOTE — Telephone Encounter (Signed)
Advised pt of lab results. Pt verbally acknowledges understanding. Mozelle Remlinger Drozdowski, CMA   

## 2014-12-15 ENCOUNTER — Ambulatory Visit: Payer: Medicare Other | Admitting: Family Medicine

## 2014-12-21 ENCOUNTER — Encounter: Payer: Self-pay | Admitting: Family Medicine

## 2014-12-21 ENCOUNTER — Ambulatory Visit (INDEPENDENT_AMBULATORY_CARE_PROVIDER_SITE_OTHER): Payer: Medicare Other | Admitting: Family Medicine

## 2014-12-21 VITALS — BP 126/70 | HR 88 | Temp 97.7°F | Resp 16 | Wt 157.0 lb

## 2014-12-21 DIAGNOSIS — E7800 Pure hypercholesterolemia, unspecified: Secondary | ICD-10-CM

## 2014-12-21 DIAGNOSIS — E039 Hypothyroidism, unspecified: Secondary | ICD-10-CM | POA: Diagnosis not present

## 2014-12-21 DIAGNOSIS — I10 Essential (primary) hypertension: Secondary | ICD-10-CM | POA: Diagnosis not present

## 2014-12-21 DIAGNOSIS — E875 Hyperkalemia: Secondary | ICD-10-CM | POA: Diagnosis not present

## 2014-12-21 DIAGNOSIS — L299 Pruritus, unspecified: Secondary | ICD-10-CM

## 2014-12-21 DIAGNOSIS — D72829 Elevated white blood cell count, unspecified: Secondary | ICD-10-CM | POA: Diagnosis not present

## 2014-12-21 MED ORDER — LEVOTHYROXINE SODIUM 50 MCG PO TABS: 50.0000 ug | ORAL_TABLET | Freq: Every day | ORAL | Status: DC

## 2014-12-21 NOTE — Progress Notes (Signed)
Patient ID: Peggy [name], female   DOB: 1928-09-10, 81 y.o.   MRN: [id number]        Patient: Peggy [name] Female    DOB: 1928-08-08   81 y.o.   MRN: [id number] Visit Date: 12/21/2014  Today's Provider: Lorie Phenix, MD   Chief Complaint  Patient presents with  . Hypothyroidism  . Hypertension  . Hyperlipidemia  . Rash   Subjective:    Hypertension This is a chronic problem. The problem is unchanged. The problem is controlled (She checks her blood pressures at home;  she doesn't remeber the results. ). Associated symptoms include shortness of breath (Had one episode Suday; but has resolved.  ). Pertinent negatives include no chest pain, headaches, neck pain or palpitations.  Hyperlipidemia This is a chronic problem. The problem is controlled. Associated symptoms include shortness of breath (Had one episode Suday; but has resolved.  ). Pertinent negatives include no chest pain or myalgias. There are no compliance problems.   Rash This is a chronic problem. The current episode started more than 1 year ago. The problem has been gradually improving (Since stopping Lisinopril) since onset. The rash is diffuse. The rash is characterized by itchiness and redness. Associated symptoms include coughing, diarrhea and shortness of breath (Had one episode Suday; but has resolved.  ). Pertinent negatives include no fatigue, fever or vomiting. Past treatments include anti-itch cream and antihistamine (Stopped Lisinopril). The treatment provided moderate relief.   Stopped both Lisinopril and Levothyroxine. Rash better. Tried to restart Lisinopril and rash came back Now back on Levoxyl.  Tolerating it well.      Allergies  Allergen Reactions  . Diphenhydramine Rash    AGITATION/DELIRIUM  . Metformin Diarrhea  . Amlodipine Rash  . Ciprofloxacin Rash  . Lisinopril Rash  . Penicillins Rash  . Saxagliptin Rash   Previous Medications   ASPIRIN 81 MG TABLET    Take by mouth.   CETIRIZINE HCL (ZYRTEC  ALLERGY) 10 MG CAPS    Take by mouth.   DOXEPIN (SINEQUAN) 10 MG CAPSULE    TK 1 C PO QHS   GABAPENTIN (NEURONTIN) 300 MG CAPSULE    Take by mouth.   GLIPIZIDE (GLUCOTROL XL) 5 MG 24 HR TABLET    Take by mouth.   HYDROCODONE-ACETAMINOPHEN (NORCO/VICODIN) 5-325 MG PER TABLET    Take 1 tablet by mouth 2 (two) times daily. As needed for chronic low back pain   HYDROXYZINE (ATARAX/VISTARIL) 25 MG TABLET    Take 1 tablet (25 mg total) by mouth 2 (two) times daily as needed.   METOPROLOL SUCCINATE (TOPROL-XL) 50 MG 24 HR TABLET    Take by mouth.   TRIAMCINOLONE CREAM (KENALOG) 0.1 %    APPLY TO AFFECTED RASH BID UNTIL CLEAR    Review of Systems  Constitutional: Negative for fever, chills, diaphoresis, activity change, appetite change, fatigue and unexpected weight change.  Respiratory: Positive for cough and shortness of breath (Had one episode Suday; but has resolved.  ). Negative for apnea, choking, chest tightness, wheezing and stridor.   Cardiovascular: Negative for chest pain, palpitations and leg swelling.  Gastrointestinal: Positive for diarrhea and constipation. Negative for nausea, vomiting, abdominal pain, blood in stool, abdominal distention, anal bleeding and rectal pain.  Musculoskeletal: Positive for back pain and arthralgias (History of arthritis). Negative for myalgias, joint swelling, gait problem, neck pain and neck stiffness.  Skin: Positive for rash.  Neurological: Positive for light-headedness. Negative for dizziness, tremors, seizures, syncope, facial asymmetry, speech  difficulty, weakness, numbness and headaches.    History  Substance Use Topics  . Smoking status: Former Smoker -- 1.00 packs/day for 30 years    Quit date: 11/30/1999  . Smokeless tobacco: Never Used  . Alcohol Use: No   Objective:   BP 126/70 mmHg  Pulse 88  Temp(Src) 97.7 F (36.5 C) (Oral)  Resp 16  Wt 157 lb (71.215 kg)  Physical Exam  Constitutional: She is oriented to person, place, and time.  She appears well-developed and well-nourished.  Cardiovascular: Normal rate and regular rhythm.   Pulmonary/Chest: Effort normal and breath sounds normal.  Neurological: She is alert and oriented to person, place, and time.      Assessment & Plan:     1. Essential (primary) hypertension Stable off Lisinopril. Will monitor. Recheck at follow up.   2. Hypercholesteremia Stable.   3. Hypothyroidism, unspecified hypothyroidism type Restarted medication. Will recheck level as scheduled.  - levothyroxine (SYNTHROID, LEVOTHROID) 50 MCG tablet; Take 1 tablet (50 mcg total) by mouth daily.  Dispense: 90 tablet; Refill: 3  4. Chronic pruritus Improved off Lisinopril.   5. Elevated WBC count Recheck labs.  - CBC with Differential/Platelet  6. Serum potassium elevated Recheck labs. - Comprehensive metabolic panel  Recheck in 2 months.    Call for OV  or ER if SOB recurs.   Lorie Phenix, MD          Lorie Phenix, MD  [facility]

## 2015-01-02 ENCOUNTER — Telehealth: Payer: Self-pay

## 2015-01-02 LAB — CBC WITH DIFFERENTIAL/PLATELET

## 2015-01-02 LAB — COMPREHENSIVE METABOLIC PANEL
A/G RATIO: 1.6 (ref 1.1–2.5)
ALT: 23 IU/L (ref 0–32)
AST: 23 IU/L (ref 0–40)
Albumin: 3.9 g/dL (ref 3.5–4.7)
Alkaline Phosphatase: 61 IU/L (ref 39–117)
BILIRUBIN TOTAL: 0.4 mg/dL (ref 0.0–1.2)
BUN/Creatinine Ratio: 22 (ref 11–26)
BUN: 16 mg/dL (ref 8–27)
CO2: 20 mmol/L (ref 18–29)
Calcium: 9.2 mg/dL (ref 8.7–10.3)
Chloride: 100 mmol/L (ref 97–108)
Creatinine, Ser: 0.72 mg/dL (ref 0.57–1.00)
GFR calc Af Amer: 89 mL/min/{1.73_m2} (ref >59)
GFR calc non Af Amer: 77 mL/min/{1.73_m2} (ref >59)
GLUCOSE: 276 mg/dL — AB (ref 65–99)
Globulin, Total: 2.5 g/dL (ref 1.5–4.5)
Potassium: 4.6 mmol/L (ref 3.5–5.2)
Sodium: 143 mmol/L (ref 134–144)
TOTAL PROTEIN: 6.4 g/dL (ref 6.0–8.5)

## 2015-01-02 NOTE — Telephone Encounter (Signed)
-----   Message from Lorie Phenix, MD sent at 01/02/2015 11:50 AM EDT ----- Potassium and calcium now normal. Please notify patient. Thanks.

## 2015-01-02 NOTE — Telephone Encounter (Signed)
Advised pt of the above results, pt verbalized fully understanding.  Thanks.

## 2015-01-25 ENCOUNTER — Telehealth: Payer: Self-pay

## 2015-01-25 NOTE — Telephone Encounter (Signed)
Pt called because she was experiencing SOB/dyspnea last night. States this episode lasted for about 1.5 hours. Pt denies orthopnea, states "it eases up when I lay down". Pt denies heart palpitations or MI sx. Pt can not come into office because she needs to take her sister to a doctor's appointment in [place]. Advised pt to make OV ASAP, and to report to ER if SOB reoccurs. Allene Dillon, CMA

## 2015-01-26 ENCOUNTER — Ambulatory Visit
Admission: RE | Admit: 2015-01-26 | Discharge: 2015-01-26 | Disposition: A | Discharge status: Home / Self Care | Payer: Medicare Other | Source: Ambulatory Visit | Attending: Family Medicine | Admitting: Family Medicine

## 2015-01-26 ENCOUNTER — Encounter: Payer: Self-pay | Admitting: Family Medicine

## 2015-01-26 ENCOUNTER — Ambulatory Visit (INDEPENDENT_AMBULATORY_CARE_PROVIDER_SITE_OTHER): Payer: Medicare Other | Admitting: Family Medicine

## 2015-01-26 VITALS — BP 140/72 | HR 86 | Temp 97.8°F | Resp 20 | Ht 62.5 in | Wt 158.0 lb

## 2015-01-26 DIAGNOSIS — R0602 Shortness of breath: Secondary | ICD-10-CM | POA: Diagnosis present

## 2015-01-26 DIAGNOSIS — R1011 Right upper quadrant pain: Secondary | ICD-10-CM | POA: Diagnosis not present

## 2015-01-26 DIAGNOSIS — F419 Anxiety disorder, unspecified: Secondary | ICD-10-CM | POA: Insufficient documentation

## 2015-01-26 MED ORDER — LORAZEPAM 0.5 MG PO TABS: 0.5000 mg | ORAL_TABLET | Freq: Two times a day (BID) | ORAL | Status: DC | PRN

## 2015-01-26 NOTE — Progress Notes (Signed)
Patient ID: Peggy [name], female   DOB: 02/23/1929, 81 y.o.   MRN: [id number]        Patient: Peggy [name] Female    DOB: 1929/04/22   81 y.o.   MRN: [id number] Visit Date: 01/26/2015  Today's Provider: Lorie Phenix, MD   Chief Complaint  Patient presents with  . Shortness of Breath   Subjective:    Shortness of Breath This is a new problem. The current episode started 1 to 4 weeks ago. The problem occurs intermittently. The problem has been unchanged. Associated symptoms include headaches and rhinorrhea. Pertinent negatives include no abdominal pain, chest pain, fever, leg pain, leg swelling, PND, rash, sore throat, sputum production, vomiting or wheezing. The symptoms are aggravated by lying flat. She has tried nothing for the symptoms. Her past medical history is significant for allergies and CAD.   Happened Monday night, the week before and a 1/2 a week before that. Happened 3 times recently. Can not remember when happened last year.  No pain with the SOB every time, but did have some pain with deep breath the time before last.  No pattern except always in evening before bed.    Always when sitting up. Lying down helps. Falls asleep and when wakes up, she feels better. Does have the pain at the same time.  That occurs at different times.  Patient was last seen for shortness of breath on 07/18/2013. Spirometry was checked at that ov. Patient has normal chest x-ray and labs checked on 07/04/2013.     Allergies  Allergen Reactions  . Diphenhydramine Rash    AGITATION/DELIRIUM  . Metformin Diarrhea  . Amlodipine Rash  . Ciprofloxacin Rash  . Lisinopril Rash  . Penicillins Rash  . Saxagliptin Rash   Previous Medications   ASPIRIN 81 MG TABLET    Take 81 mg by mouth daily.    CETIRIZINE HCL (ZYRTEC ALLERGY) 10 MG CAPS    Take 1 capsule by mouth daily.    GABAPENTIN (NEURONTIN) 300 MG CAPSULE    TK 1 TO 2 CS PO HS UTD   GLIPIZIDE (GLUCOTROL XL) 5 MG 24 HR TABLET    Take 5 mg by  mouth daily with breakfast.    HYDROCODONE-ACETAMINOPHEN (NORCO/VICODIN) 5-325 MG PER TABLET    Take 1 tablet by mouth 2 (two) times daily. As needed for chronic low back pain   HYDROXYZINE (ATARAX/VISTARIL) 25 MG TABLET    Take 1 tablet (25 mg total) by mouth 2 (two) times daily as needed.   LEVOTHYROXINE (SYNTHROID, LEVOTHROID) 50 MCG TABLET    Take 1 tablet (50 mcg total) by mouth daily.   METOPROLOL SUCCINATE (TOPROL-XL) 50 MG 24 HR TABLET    Take 50 mg by mouth daily.    TRIAMCINOLONE CREAM (KENALOG) 0.1 %    APPLY TO AFFECTED RASH BID UNTIL CLEAR    Review of Systems  Constitutional: Negative for fever.  HENT: Positive for rhinorrhea. Negative for sore throat.   Respiratory: Positive for shortness of breath. Negative for sputum production and wheezing.   Cardiovascular: Negative for chest pain, leg swelling and PND.  Gastrointestinal: Negative for vomiting and abdominal pain.  Skin: Negative for rash.  Neurological: Positive for headaches.    Social History  Substance Use Topics  . Smoking status: Former Smoker -- 1.00 packs/day for 30 years    Quit date: 11/30/1999  . Smokeless tobacco: Never Used  . Alcohol Use: No   Objective:   BP 140/72 mmHg  Pulse 86  Temp(Src) 97.8 F (36.6 C) (Oral)  Resp 20  Ht 5' 2.5" (1.588 m)  Wt 158 lb (71.668 kg)  BMI 28.42 kg/m2  SpO2 98%  Physical Exam  Constitutional: She is oriented to person, place, and time. She appears well-developed and well-nourished.  HENT:  Head: Normocephalic and atraumatic.  Right Ear: External ear normal.  Left Ear: External ear normal.  Mouth/Throat: Oropharynx is clear and moist.  Eyes: Conjunctivae and EOM are normal. Pupils are equal, round, and reactive to light.  Neck: Normal range of motion. Neck supple.  Cardiovascular: Normal rate and regular rhythm.   Pulmonary/Chest: Effort normal and breath sounds normal.  Neurological: She is alert and oriented to person, place, and time.  Psychiatric: She  has a normal mood and affect. Her behavior is normal. Judgment and thought content normal.       Assessment & Plan:     1. RUQ abdominal pain Vague symptoms. Past not great historian. Will check ultrasound and labs. ER if any constant pain.  - US Abdomen Limited RUQ; Future - Comprehensive metabolic panel - CBC with Differential/Platelet  2. Shortness of breath Patient is difficult historian. Mostly concerned about acute short lived episodes, but still has some baseline SOB. Difficult to elucidate if this is a change. Will check CXR. Spirometry with some mild changes, but read out says normal and difficult to say if cause of symptoms. Was a smoker years ago. Sister does have COPD. Patient has CAD. Will check CXR.  - DG Chest 2 View; Future - Spirometry with graph  3. Anxiety Worsening.  May be developing panic attacks. Will perform work up above. Start medication as needed and recheck in 2 weeks.   - LORazepam (ATIVAN) 0.5 MG tablet; Take 1 tablet (0.5 mg total) by mouth 2 (two) times daily as needed for anxiety.  Dispense: 30 tablet; Refill: 0     Lorie Phenix, MD  [facility]

## 2015-01-27 LAB — COMPREHENSIVE METABOLIC PANEL
ALBUMIN: 4.2 g/dL (ref 3.5–4.7)
ALT: 19 IU/L (ref 0–32)
AST: 18 IU/L (ref 0–40)
Albumin/Globulin Ratio: 1.8 (ref 1.1–2.5)
Alkaline Phosphatase: 67 IU/L (ref 39–117)
BUN / CREAT RATIO: 18 (ref 11–26)
BUN: 16 mg/dL (ref 8–27)
Bilirubin Total: 0.6 mg/dL (ref 0.0–1.2)
CO2: 26 mmol/L (ref 18–29)
CREATININE: 0.9 mg/dL (ref 0.57–1.00)
Calcium: 9.5 mg/dL (ref 8.7–10.3)
Chloride: 103 mmol/L (ref 97–108)
GFR calc non Af Amer: 59 mL/min/{1.73_m2} — ABNORMAL LOW (ref >59)
GFR, EST AFRICAN AMERICAN: 68 mL/min/{1.73_m2} (ref >59)
GLOBULIN, TOTAL: 2.4 g/dL (ref 1.5–4.5)
Glucose: 200 mg/dL — ABNORMAL HIGH (ref 65–99)
Potassium: 4.8 mmol/L (ref 3.5–5.2)
Sodium: 145 mmol/L — ABNORMAL HIGH (ref 134–144)
Total Protein: 6.6 g/dL (ref 6.0–8.5)

## 2015-01-27 LAB — CBC WITH DIFFERENTIAL/PLATELET
Basophils Absolute: 0 10*3/uL (ref 0.0–0.2)
Basos: 0 %
EOS (ABSOLUTE): 0.3 10*3/uL (ref 0.0–0.4)
EOS: 3 %
HEMOGLOBIN: 13.5 g/dL (ref 11.1–15.9)
Hematocrit: 41.6 % (ref 34.0–46.6)
Immature Grans (Abs): 0 10*3/uL (ref 0.0–0.1)
Immature Granulocytes: 0 %
LYMPHS ABS: 1.6 10*3/uL (ref 0.7–3.1)
Lymphs: 14 %
MCH: 28.3 pg (ref 26.6–33.0)
MCHC: 32.5 g/dL (ref 31.5–35.7)
MCV: 87 fL (ref 79–97)
MONOS ABS: 0.8 10*3/uL (ref 0.1–0.9)
Monocytes: 7 %
NEUTROS ABS: 8.4 10*3/uL — AB (ref 1.4–7.0)
Neutrophils: 76 %
Platelets: 272 10*3/uL (ref 150–379)
RBC: 4.77 x10E6/uL (ref 3.77–5.28)
RDW: 13.6 % (ref 12.3–15.4)
WBC: 11.1 10*3/uL — AB (ref 3.4–10.8)

## 2015-01-29 ENCOUNTER — Telehealth: Payer: Self-pay

## 2015-01-29 NOTE — Telephone Encounter (Signed)
LMTCB 01/29/2015  Thanks,   -Sheala Dosh  

## 2015-01-29 NOTE — Telephone Encounter (Signed)
Patient will call to reschedule u/s appointment on 02/01/2015, per pt she will be out of town. Gave pt phone number [phone]. sd

## 2015-01-29 NOTE — Telephone Encounter (Signed)
Patient advised as below. Patient reports she is feeling well. sd

## 2015-01-29 NOTE — Telephone Encounter (Signed)
-----   Message from Lorie Phenix, MD sent at 01/28/2015 12:38 PM EDT ----- Labs stable except white count mildly elevated. Please see how patient is doing. Thanks.

## 2015-02-01 ENCOUNTER — Telehealth: Payer: Self-pay | Admitting: Family Medicine

## 2015-02-01 ENCOUNTER — Ambulatory Visit: Payer: Medicare Other

## 2015-02-01 NOTE — Telephone Encounter (Signed)
Pt called saying she was supposed to get an abd [place] .  But she is not having anymore problems and wonders if she still needs to have it done.  She said some one was going to call her back with info about when but has not rec a call yet.  Her call back is  [phone]  Thanks Barth Kirks

## 2015-02-01 NOTE — Telephone Encounter (Signed)
I think I put this order in already. Is this one we are supposed to tell you about.  Not sure why this did not go thru.  Thanks.

## 2015-02-07 NOTE — Telephone Encounter (Signed)
Pt states that she no longer is having abdominal pain and does not feel ultrasound is needed.I spoke with Dr Elease Hashimoto who states she is ok with pt not having ultrasound done at this time

## 2015-02-09 ENCOUNTER — Ambulatory Visit (INDEPENDENT_AMBULATORY_CARE_PROVIDER_SITE_OTHER): Payer: Medicare Other | Admitting: Family Medicine

## 2015-02-09 ENCOUNTER — Encounter: Payer: Self-pay | Admitting: Family Medicine

## 2015-02-09 VITALS — BP 132/68 | HR 80 | Temp 97.8°F | Resp 16 | Ht 63.0 in | Wt 157.0 lb

## 2015-02-09 DIAGNOSIS — R0602 Shortness of breath: Secondary | ICD-10-CM | POA: Diagnosis not present

## 2015-02-09 DIAGNOSIS — R350 Frequency of micturition: Secondary | ICD-10-CM | POA: Insufficient documentation

## 2015-02-09 DIAGNOSIS — F419 Anxiety disorder, unspecified: Secondary | ICD-10-CM | POA: Diagnosis not present

## 2015-02-09 MED ORDER — OXYBUTYNIN CHLORIDE 5 MG PO TABS: 5.0000 mg | ORAL_TABLET | Freq: Every evening | ORAL | Status: DC

## 2015-02-09 NOTE — Progress Notes (Signed)
Patient ID: Peggy [name], female   DOB: 01/07/1929, 81 y.o.   MRN: [id number]   Peggy [name]  MRN: [id number] DOB: 1928-12-22  Subjective:  Shortness of Breath The current episode started 1 to 4 weeks ago. The problem has been resolved.  Patient was seen on on 01/26/15 and was prescribed Lorazpam 0.5mg . Patient reports that she thinks her symptoms were related to a panic attack. She reports that she has not had any other symptoms since that one episode 2 weeks ago. Patient denies any side effects on medication.  Take Lorazepam once a day now. No side effects to it.     Also complaining of urinary frequency, especially at night. Up multiple times a night. Affecting sleep and quality of life. No dysuria.  No systemic symptoms.     Patient Active Problem List   Diagnosis Date Noted  . RUQ abdominal pain 01/26/2015  . Shortness of breath 01/26/2015  . Anxiety 01/26/2015  . Chronic pruritus 12/21/2014  . Cerebral vascular accident 11/16/2014  . Heart attack 11/16/2014  . Chronic low back pain 11/16/2014  . Adult hypothyroidism 11/15/2014  . Allergic rhinitis 11/15/2014  . Body mass index (BMI) of 28.0-28.9 in adult 11/15/2014  . Arthritis 11/15/2014  . Atherosclerosis of coronary artery 11/15/2014  . Chronic pain 11/15/2014  . Cramps of lower extremity 11/15/2014  . Diverticulitis of colon with perforation 11/15/2014  . Essential (primary) hypertension 11/15/2014  . Acid reflux 11/15/2014  . Hypercholesteremia 11/15/2014  . Cannot sleep 11/15/2014  . Psoriasis 11/15/2014  . Itch of skin 11/15/2014  . Restless leg 11/15/2014  . Diabetes mellitus, type 2 11/15/2014  . Breath shortness 11/15/2014  . Arteriosclerosis of coronary artery 02/20/2014  . AF (paroxysmal atrial fibrillation) 02/20/2014  . Temporary cerebral vascular dysfunction 02/20/2014  . DD (diverticular disease) 10/05/2013    Past Medical History  Diagnosis Date  . Allergy   . Diabetes mellitus without complication    . GERD (gastroesophageal reflux disease)   . Hyperlipidemia   . Hypertension   . Arthritis     Social History   Social History  . Marital Status: Widowed    Spouse Name: N/A  . Number of Children: 3  . Years of Education: H/S   Occupational History  . Retired    Social History Main Topics  . Smoking status: Former Smoker -- 1.00 packs/day for 30 years    Quit date: 11/30/1999  . Smokeless tobacco: Never Used  . Alcohol Use: No  . Drug Use: No  . Sexual Activity: Not on file   Other Topics Concern  . Not on file   Social History Narrative    Outpatient Prescriptions Prior to Visit  Medication Sig Dispense Refill  . aspirin 81 MG tablet Take 81 mg by mouth daily.     . Cetirizine HCl (ZYRTEC ALLERGY) 10 MG CAPS Take 1 capsule by mouth daily.     Marland Kitchen gabapentin (NEURONTIN) 300 MG capsule TK 1 TO 2 CS PO HS UTD  2  . glipiZIDE (GLUCOTROL XL) 5 MG 24 hr tablet Take 5 mg by mouth daily with breakfast.     . HYDROcodone-acetaminophen (NORCO/VICODIN) 5-325 MG per tablet Take 1 tablet by mouth 2 (two) times daily. As needed for chronic low back pain 60 tablet 0  . hydrOXYzine (ATARAX/VISTARIL) 25 MG tablet Take 1 tablet (25 mg total) by mouth 2 (two) times daily as needed. 60 tablet 5  . levothyroxine (SYNTHROID, LEVOTHROID) 50 MCG tablet Take  1 tablet (50 mcg total) by mouth daily. 90 tablet 3  . LORazepam (ATIVAN) 0.5 MG tablet Take 1 tablet (0.5 mg total) by mouth 2 (two) times daily as needed for anxiety. 30 tablet 0  . metoprolol succinate (TOPROL-XL) 50 MG 24 hr tablet Take 50 mg by mouth daily.     Marland Kitchen triamcinolone cream (KENALOG) 0.1 % APPLY TO AFFECTED RASH BID UNTIL CLEAR  1   No facility-administered medications prior to visit.    Allergies  Allergen Reactions  . Diphenhydramine Rash    AGITATION/DELIRIUM  . Metformin Diarrhea  . Amlodipine Rash  . Ciprofloxacin Rash  . Lisinopril Rash  . Penicillins Rash  . Saxagliptin Rash    Review of Systems   Constitutional: Negative.   Respiratory: Positive for shortness of breath.   Cardiovascular: Negative.   Gastrointestinal: Negative.   Genitourinary: Positive for frequency. Negative for dysuria, urgency, hematuria and flank pain.  Psychiatric/Behavioral: Negative.    Objective:  BP 132/68 mmHg  Pulse 80  Temp(Src) 97.8 F (36.6 C)  Resp 16  Ht 5\' 3"  (1.6 m)  Wt 157 lb (71.215 kg)  BMI 27.82 kg/m2  Physical Exam  Constitutional: She is oriented to person, place, and time and well-developed, well-nourished, and in no distress.  Abdominal: Soft. Bowel sounds are normal.  Neurological: She is alert and oriented to person, place, and time.  Psychiatric: Mood, memory, affect and judgment normal.    Assessment and Plan :   1. Urinary frequency New problem Condition is worsening. Will start medication for better control.  Monitor for side effects  Warned especially about gait instability. . Patient instructed to call back if condition worsens or does not improve.    - oxybutynin (DITROPAN) 5 MG tablet; Take 1 tablet (5 mg total) by mouth every evening.  Dispense: 30 tablet; Refill: 5 Results for orders placed or performed in visit on 02/09/15  POCT urinalysis dipstick  Result Value Ref Range   Color, UA clear    Clarity, UA neg    Glucose, UA neg    Bilirubin, UA neg    Ketones, UA neg    Spec Grav, UA 1.015    Blood, UA neg    pH, UA 6.5    Protein, UA neg    Urobilinogen, UA 0.2    Nitrite, UA neg    Leukocytes, UA Negative Negative   2. Anxiety Improved on current medication. Will try to taper and see if remains stable.    3. Breath shortness Resolved. Taper Xanax and take prn if recurs. Return if this does not remain stable.   Lorie Phenix, MD    [facility]o Grosser [facility] 02/09/2015 11:32 AM

## 2015-02-11 LAB — POCT URINALYSIS DIPSTICK
Bilirubin, UA: NEGATIVE
Clarity, UA: NEGATIVE
Glucose, UA: NEGATIVE
KETONES UA: NEGATIVE
LEUKOCYTES UA: NEGATIVE
NITRITE UA: NEGATIVE
PH UA: 6.5
PROTEIN UA: NEGATIVE
RBC UA: NEGATIVE
Spec Grav, UA: 1.015
Urobilinogen, UA: 0.2

## 2015-03-01 ENCOUNTER — Encounter: Payer: Self-pay | Admitting: Family Medicine

## 2015-03-01 ENCOUNTER — Ambulatory Visit (INDEPENDENT_AMBULATORY_CARE_PROVIDER_SITE_OTHER): Payer: Medicare Other | Admitting: Family Medicine

## 2015-03-01 VITALS — BP 140/76 | HR 74 | Temp 97.5°F | Resp 16 | Wt 158.6 lb

## 2015-03-01 DIAGNOSIS — E119 Type 2 diabetes mellitus without complications: Secondary | ICD-10-CM | POA: Diagnosis not present

## 2015-03-01 DIAGNOSIS — Z23 Encounter for immunization: Secondary | ICD-10-CM

## 2015-03-01 DIAGNOSIS — I10 Essential (primary) hypertension: Secondary | ICD-10-CM | POA: Diagnosis not present

## 2015-03-01 LAB — POCT GLYCOSYLATED HEMOGLOBIN (HGB A1C): Hemoglobin A1C: 8

## 2015-03-01 NOTE — Progress Notes (Signed)
Patient ID: ZAHMYA BOREL, female   DOB: 07/20/1928, 81 y.o.   MRN: [id number]       Patient: LULAMAE THIELBAR Female    DOB: [birth year]   81 y.o.   MRN: [id number] Visit Date: 03/01/2015  Today's Provider: Lorie Phenix, MD   Chief Complaint  Patient presents with  . Hypertension    follow-up, off lisinorpil, continues on 1/2 tab of Troprol-XL 50 mg daily.  . Diabetes    follow-up, last A1C was 7.5 on 11/16/2014   Subjective:    Hypertension This is a chronic problem. The current episode started more than 1 year ago. The problem is controlled. Associated symptoms include malaise/fatigue. Pertinent negatives include no anxiety, blurred vision, chest pain, headaches, neck pain, palpitations, peripheral edema or shortness of breath. Risk factors for coronary artery disease include diabetes mellitus. Past treatments include beta blockers and ACE inhibitors (metoprolol 25 mg daily.  Stopped Lisinopril  because of itchinng. ). There are no compliance problems.   Diabetes She presents for her follow-up diabetic visit. She has type 2 diabetes mellitus. Her disease course has been stable. There are no hypoglycemic associated symptoms. Pertinent negatives for hypoglycemia include no headaches. Pertinent negatives for diabetes include no blurred vision and no chest pain. There are no hypoglycemic complications. Risk factors for coronary artery disease include diabetes mellitus and hypertension. Current diabetic treatment includes oral agent (monotherapy). She is compliant with treatment all of the time. Her weight is stable. She is following a generally healthy diet. Diabetic meal planning: Has been eating too many sweets and tomatoes.   She has had a previous visit with a dietitian (a year ago). She rarely participates in exercise. Her home blood glucose trend is increasing steadily. Her breakfast blood glucose is taken between 8-9 am. Her breakfast blood glucose range is generally 140-180 mg/dl. She does not see  a podiatrist.Eye exam is not current (appointment on Oct 7th, 2016).   Tried medication for bladder and caused unsteady gait. Stopped it.    Also, anxiety is better. Does not need Xanax anymore.     Allergies  Allergen Reactions  . Diphenhydramine Rash    AGITATION/DELIRIUM  . Metformin Diarrhea  . Oxybutynin Other (See Comments)    dizzy  . Amlodipine Rash  . Ciprofloxacin Rash  . Lisinopril Rash  . Penicillins Rash  . Saxagliptin Rash   Previous Medications   ASPIRIN 81 MG TABLET    Take 81 mg by mouth daily.    CETIRIZINE HCL (ZYRTEC ALLERGY) 10 MG CAPS    Take 1 capsule by mouth daily.    FLUTICASONE (FLONASE) 50 MCG/ACT NASAL SPRAY    U ONE SPRAY IEN D   GABAPENTIN (NEURONTIN) 300 MG CAPSULE    TK 1 TO 2 CS PO HS UTD   GLIPIZIDE (GLUCOTROL XL) 5 MG 24 HR TABLET    Take 5 mg by mouth daily with breakfast.    HYDROCODONE-ACETAMINOPHEN (NORCO/VICODIN) 5-325 MG PER TABLET    Take 1 tablet by mouth 2 (two) times daily. As needed for chronic low back pain   HYDROXYZINE (ATARAX/VISTARIL) 25 MG TABLET    Take 1 tablet (25 mg total) by mouth 2 (two) times daily as needed.   LEVOTHYROXINE (SYNTHROID, LEVOTHROID) 50 MCG TABLET    Take 1 tablet (50 mcg total) by mouth daily.   LORAZEPAM (ATIVAN) 0.5 MG TABLET    Take 1 tablet (0.5 mg total) by mouth 2 (two) times daily as needed for anxiety.  METOPROLOL SUCCINATE (TOPROL-XL) 50 MG 24 HR TABLET    Take 50 mg by mouth daily.    OXYBUTYNIN (DITROPAN) 5 MG TABLET    Take 1 tablet (5 mg total) by mouth every evening.   TRIAMCINOLONE CREAM (KENALOG) 0.1 %    APPLY TO AFFECTED RASH BID UNTIL CLEAR    Review of Systems  Constitutional: Positive for malaise/fatigue.  Eyes: Negative for blurred vision.  Respiratory: Negative for shortness of breath.   Cardiovascular: Negative for chest pain and palpitations.  Musculoskeletal: Negative for neck pain.  Neurological: Negative for headaches.    Social History  Substance Use Topics  .  Smoking status: Former Smoker -- 1.00 packs/day for 30 years    Quit date: 11/30/1999  . Smokeless tobacco: Never Used  . Alcohol Use: No   Objective:   BP 140/76 mmHg  Pulse 74  Temp(Src) 97.5 F (36.4 C) (Oral)  Resp 16  Wt 158 lb 9.6 oz (71.94 kg)  SpO2 95%  Physical Exam  Constitutional: She is oriented to person, place, and time. She appears well-developed and well-nourished.  Cardiovascular: Normal rate and regular rhythm.   Pulmonary/Chest: Effort normal and breath sounds normal.  Neurological: She is alert and oriented to person, place, and time.  Psychiatric: She has a normal mood and affect. Her behavior is normal. Judgment and thought content normal.      Assessment & Plan:     1. Essential hypertension Stable.  Continue current continue current medication and plan of care.   2. Type 2 diabetes mellitus without complication Not as good as previous.  Will work on lifestyle changes.  Recheck in 3 months.  - POCT glycosylated hemoglobin (Hb A1C) Results for orders placed or performed in visit on 03/01/15  POCT glycosylated hemoglobin (Hb A1C)  Result Value Ref Range   Hemoglobin A1C 8.0    3. Flu vaccine need Given today.  - Flu vaccine HIGH DOSE PF (Fluzone High dose)       Lorie Phenix, MD  [facility]

## 2015-03-02 DIAGNOSIS — Z23 Encounter for immunization: Secondary | ICD-10-CM | POA: Insufficient documentation

## 2015-04-05 ENCOUNTER — Other Ambulatory Visit: Payer: Self-pay | Admitting: Family Medicine

## 2015-04-05 DIAGNOSIS — M545 Low back pain, unspecified: Secondary | ICD-10-CM

## 2015-04-05 DIAGNOSIS — G8929 Other chronic pain: Secondary | ICD-10-CM

## 2015-05-17 ENCOUNTER — Ambulatory Visit (INDEPENDENT_AMBULATORY_CARE_PROVIDER_SITE_OTHER): Payer: Medicare Other | Admitting: Family Medicine

## 2015-05-17 ENCOUNTER — Encounter: Payer: Self-pay | Admitting: Family Medicine

## 2015-05-17 VITALS — BP 128/70 | HR 72 | Temp 97.8°F | Resp 16 | Wt 156.0 lb

## 2015-05-17 DIAGNOSIS — H6983 Other specified disorders of Eustachian tube, bilateral: Secondary | ICD-10-CM | POA: Diagnosis not present

## 2015-05-17 DIAGNOSIS — E119 Type 2 diabetes mellitus without complications: Secondary | ICD-10-CM | POA: Diagnosis not present

## 2015-05-17 DIAGNOSIS — J0110 Acute frontal sinusitis, unspecified: Secondary | ICD-10-CM

## 2015-05-17 DIAGNOSIS — R21 Rash and other nonspecific skin eruption: Secondary | ICD-10-CM | POA: Diagnosis not present

## 2015-05-17 MED ORDER — MONTELUKAST SODIUM 10 MG PO TABS: 10.0000 mg | ORAL_TABLET | Freq: Every day | ORAL | Status: DC

## 2015-05-17 MED ORDER — TRIAMCINOLONE ACETONIDE 0.1 % EX CREA: TOPICAL_CREAM | CUTANEOUS | Status: DC

## 2015-05-17 MED ORDER — AZITHROMYCIN 250 MG PO TABS: ORAL_TABLET | ORAL | Status: DC

## 2015-05-17 NOTE — Progress Notes (Signed)
Patient ID: Peggy [name], female   DOB: November 04, 1928, 81 y.o.   MRN: [id number]       Patient: Peggy [name] Female    DOB: 1929/05/02   81 y.o.   MRN: [id number] Visit Date: 05/17/2015  Today's Provider: Lorie Phenix, MD   Chief Complaint  Patient presents with  . Sinusitis   Subjective:    Sinusitis This is a new problem. The current episode started 1 to 4 weeks ago. The problem has been gradually improving since onset. There has been no fever. Associated symptoms include congestion, ear pain, headaches, a hoarse voice, sinus pressure and sneezing. Pertinent negatives include no chills, coughing, diaphoresis, shortness of breath, sore throat or swollen glands. Past treatments include oral decongestants. The treatment provided mild (Pt started Zyrtec and that has helped some.) relief.   Also need refill of medication for a rash. Medication helps.   Also concerned about her blood sugars. They are running up. Has follow up in several weeks to address this.  Is not sure of what does of Glipizide she is taking.  She thinks 2.5 and we have 5 mg  Documented.       Allergies  Allergen Reactions  . Diphenhydramine Rash    AGITATION/DELIRIUM  . Metformin Diarrhea  . Oxybutynin Other (See Comments)    dizzy  . Amlodipine Rash  . Ciprofloxacin Rash  . Lisinopril Rash  . Penicillins Rash  . Saxagliptin Rash   Previous Medications   ASPIRIN 81 MG TABLET    Take 81 mg by mouth daily.    CETIRIZINE HCL (ZYRTEC ALLERGY) 10 MG CAPS    Take 1 capsule by mouth daily.    FLUTICASONE (FLONASE) 50 MCG/ACT NASAL SPRAY    U ONE SPRAY IEN D   GABAPENTIN (NEURONTIN) 300 MG CAPSULE    TAKE 1 TO 2 CAPSULES BY MOUTH AT BEDTIME AS DIRECTED   GLIPIZIDE (GLUCOTROL XL) 5 MG 24 HR TABLET    Take 5 mg by mouth daily with breakfast.    HYDROCODONE-ACETAMINOPHEN (NORCO/VICODIN) 5-325 MG PER TABLET    Take 1 tablet by mouth 2 (two) times daily. As needed for chronic low back pain   HYDROXYZINE (ATARAX/VISTARIL) 25  MG TABLET    Take 1 tablet (25 mg total) by mouth 2 (two) times daily as needed.   LEVOTHYROXINE (SYNTHROID, LEVOTHROID) 50 MCG TABLET    Take 1 tablet (50 mcg total) by mouth daily.   LORAZEPAM (ATIVAN) 0.5 MG TABLET    Take 1 tablet (0.5 mg total) by mouth 2 (two) times daily as needed for anxiety.   METOPROLOL SUCCINATE (TOPROL-XL) 50 MG 24 HR TABLET    Take 50 mg by mouth daily.    TRIAMCINOLONE CREAM (KENALOG) 0.1 %    APPLY TO AFFECTED RASH BID UNTIL CLEAR    Review of Systems  Constitutional: Negative for fever, chills, diaphoresis, activity change, appetite change, fatigue and unexpected weight change.  HENT: Positive for congestion, ear pain, hoarse voice, rhinorrhea, sinus pressure and sneezing. Negative for ear discharge, hearing loss, nosebleeds, postnasal drip, sore throat, tinnitus, trouble swallowing and voice change.   Eyes: Positive for discharge and itching. Negative for photophobia, pain, redness and visual disturbance.  Respiratory: Negative for apnea, cough, choking, chest tightness, shortness of breath, wheezing and stridor.   Cardiovascular: Negative for chest pain, palpitations and leg swelling.  Gastrointestinal: Negative.   Neurological: Positive for dizziness and headaches. Negative for light-headedness.    Social History  Substance Use Topics  .  Smoking status: Former Smoker -- 1.00 packs/day for 30 years    Quit date: 11/30/1999  . Smokeless tobacco: Never Used  . Alcohol Use: No   Objective:   BP 128/70 mmHg  Pulse 72  Temp(Src) 97.8 F (36.6 C) (Oral)  Resp 16  Wt 156 lb (70.761 kg)  Physical Exam  Constitutional: She is oriented to person, place, and time. She appears well-developed and well-nourished.  HENT:  Head: Normocephalic and atraumatic.  Right Ear: External ear normal.  Left Ear: External ear normal.  Nose: Mucosal edema present.  Mouth/Throat: Oropharynx is clear and moist.  Eyes: Conjunctivae and EOM are normal. Pupils are equal,  round, and reactive to light.  Neck: Normal range of motion. Neck supple.  Cardiovascular: Normal rate, regular rhythm and normal heart sounds.   Pulmonary/Chest: Effort normal and breath sounds normal.  Neurological: She is oriented to person, place, and time.  Psychiatric: She has a normal mood and affect. Her behavior is normal. Judgment and thought content normal.        Assessment & Plan:       1. Acute frontal sinusitis, recurrence not specified Wrote for antibiotic, pt will not start unless her symptoms do not improve for worsens, after starting Singulair.   - azithromycin (ZITHROMAX) 250 MG tablet; Take two for one day, and one a everyday until gone.  Dispense: 6 tablet; Refill: 0  2. Eustachian tube dysfunction, bilateral Worsening will try to add Singular, if not improved after a few days, pt to start antibiotic.    - montelukast (SINGULAIR) 10 MG tablet; Take 1 tablet (10 mg total) by mouth at bedtime.  Dispense: 30 tablet; Refill: 5  3. Rash Stable, refill provided.   - triamcinolone cream (KENALOG) 0.1 %; APPLY TO AFFECTED RASH BID UNTIL CLEAR  Dispense: 30 g; Refill: 1  4. Type 2 diabetes mellitus without complication, without long-term current use of insulin (HCC) Pt reports her blood sugar has been running high.  She states she only has Glipizide 2.5mg  at home.  We show 5mg  here.  Advised pt to double check her bottle and call.  She may have to increase to 5mg  in the morning.  Will recheck at her follow up visit 12/29.  Advised pt to bring all of her medications.      Patient was seen and examined by Leo Grosser, MD, and note scribed by Kavin Leech, CMA.  I have reviewed the document for accuracy and completeness and I agree with above. - Leo Grosser, MD   Lorie Phenix, MD  [facility]

## 2015-05-31 ENCOUNTER — Ambulatory Visit (INDEPENDENT_AMBULATORY_CARE_PROVIDER_SITE_OTHER): Payer: Medicare Other | Admitting: Family Medicine

## 2015-05-31 ENCOUNTER — Encounter: Payer: Self-pay | Admitting: Family Medicine

## 2015-05-31 VITALS — BP 138/62 | HR 74 | Temp 98.4°F | Resp 16 | Wt 156.0 lb

## 2015-05-31 DIAGNOSIS — I10 Essential (primary) hypertension: Secondary | ICD-10-CM

## 2015-05-31 DIAGNOSIS — E119 Type 2 diabetes mellitus without complications: Secondary | ICD-10-CM

## 2015-05-31 LAB — POCT GLYCOSYLATED HEMOGLOBIN (HGB A1C)
ESTIMATED AVERAGE GLUCOSE: 186
HEMOGLOBIN A1C: 8.1

## 2015-05-31 MED ORDER — GLIPIZIDE ER 10 MG PO TB24: 10.0000 mg | ORAL_TABLET | Freq: Every day | ORAL | Status: DC

## 2015-05-31 NOTE — Progress Notes (Signed)
Patient ID: Peggy [name], female   DOB: [birth year], 81 y.o.   MRN: [id number]       Patient: Peggy [name] Female    DOB: 1928-11-15   81 y.o.   MRN: [id number] Visit Date: 05/31/2015  Today's Provider: Lorie Phenix, MD   Chief Complaint  Patient presents with  . Diabetes    3 month FU.    Subjective:    HPI  Diabetes Mellitus Type II, Follow-up:   Lab Results  Component Value Date   HGBA1C 8.0 03/01/2015   HGBA1C 7.5% 11/16/2014    Last seen for diabetes 3 months ago.  Management since then includes no changes. She reports good compliance with treatment. She is not having side effects.  Current symptoms include hyperglycemia and have been worsening. Home blood sugar records: trend: fluctuating a bit  Episodes of hypoglycemia? no   Current Insulin Regimen: none Most Recent Eye Exam: 1 month ago  Weight trend: stable Current diet: in general, a "healthy" diet   Current exercise: none  Pertinent Labs:    Component Value Date/Time   CHOL 245* 10/07/2013 0438   TRIG 786* 10/07/2013 0438   HDL 29* 10/07/2013 0438   LDLCALC SEE COMMENT 10/07/2013 0438   CREATININE 0.90 01/26/2015 1209   CREATININE 0.9 07/07/2014   CREATININE 0.86 12/03/2013 1042    Wt Readings from Last 3 Encounters:  05/31/15 156 lb (70.761 kg)  05/17/15 156 lb (70.761 kg)  03/01/15 158 lb 9.6 oz (71.94 kg)    Blood pressure stable today. Taking medication without difficulty.        Allergies  Allergen Reactions  . Diphenhydramine Rash    AGITATION/DELIRIUM  . Metformin Diarrhea  . Oxybutynin Other (See Comments)    dizzy  . Amlodipine Rash  . Ciprofloxacin Rash  . Lisinopril Rash  . Penicillins Rash  . Saxagliptin Rash   Previous Medications   ASPIRIN 81 MG TABLET    Take 81 mg by mouth daily.    AZITHROMYCIN (ZITHROMAX) 250 MG TABLET    Take two for one day, and one a everyday until gone.   CETIRIZINE HCL (ZYRTEC ALLERGY) 10 MG CAPS    Take 1 capsule by mouth daily.    FLUTICASONE (FLONASE) 50 MCG/ACT NASAL SPRAY    U ONE SPRAY IEN D   GABAPENTIN (NEURONTIN) 300 MG CAPSULE    TAKE 1 TO 2 CAPSULES BY MOUTH AT BEDTIME AS DIRECTED   GLIPIZIDE (GLUCOTROL XL) 5 MG 24 HR TABLET    Take 5 mg by mouth daily with breakfast.    HYDROCODONE-ACETAMINOPHEN (NORCO/VICODIN) 5-325 MG PER TABLET    Take 1 tablet by mouth 2 (two) times daily. As needed for chronic low back pain   HYDROXYZINE (ATARAX/VISTARIL) 25 MG TABLET    Take 1 tablet (25 mg total) by mouth 2 (two) times daily as needed.   LEVOTHYROXINE (SYNTHROID, LEVOTHROID) 50 MCG TABLET    Take 1 tablet (50 mcg total) by mouth daily.   LORAZEPAM (ATIVAN) 0.5 MG TABLET    Take 1 tablet (0.5 mg total) by mouth 2 (two) times daily as needed for anxiety.   METOPROLOL SUCCINATE (TOPROL-XL) 50 MG 24 HR TABLET    Take 50 mg by mouth daily.    MONTELUKAST (SINGULAIR) 10 MG TABLET    Take 1 tablet (10 mg total) by mouth at bedtime.   TRIAMCINOLONE CREAM (KENALOG) 0.1 %    APPLY TO AFFECTED RASH BID UNTIL CLEAR    Review of  Systems  Constitutional: Negative.   Respiratory: Negative.   Cardiovascular: Negative.   Endocrine: Negative.   Neurological: Negative.   Psychiatric/Behavioral: Negative.     Social History  Substance Use Topics  . Smoking status: Former Smoker -- 1.00 packs/day for 30 years    Quit date: 11/30/1999  . Smokeless tobacco: Never Used  . Alcohol Use: No   Objective:   BP 138/62 mmHg  Pulse 74  Temp(Src) 98.4 F (36.9 C)  Resp 16  Wt 156 lb (70.761 kg)  Physical Exam  Constitutional: She is oriented to person, place, and time. She appears well-developed and well-nourished.  Cardiovascular: Normal rate, regular rhythm, normal heart sounds and intact distal pulses.   Pulmonary/Chest: Effort normal and breath sounds normal.  Neurological: She is alert and oriented to person, place, and time. She has normal reflexes.  Psychiatric: She has a normal mood and affect. Her behavior is normal. Judgment  and thought content normal.  Nursing note and vitals reviewed.       Assessment & Plan:     1. Type 2 diabetes mellitus without complication, without long-term current use of insulin (HCC) Not to goal. Patient reports that sugars have been running in the 200s. Patient may increase Glipizide to  daily. Encouraged patient to start healthy diet and exercise. Will recheck HgbA1c in 3 months for stability.  - POCT HgB A1C - glipiZIDE (GLUCOTROL XL) 10 MG 24 hr tablet; Take 1 tablet (10 mg total) by mouth daily with breakfast.  Dispense: 30 tablet; Refill: 5 Results for orders placed or performed in visit on 05/31/15  POCT HgB A1C  Result Value Ref Range   Hemoglobin A1C 8.1    Est. average glucose Bld gHb Est-mCnc 186      2. Essential (primary) hypertension Condition is stable. Please continue current medication and  plan of care as noted.    Patient was seen and examined by Leo Grosser, MD, and scribed by Anson [place], CMA.  I have reviewed the document for accuracy and completeness and I agree with above. - Leo Grosser, MD   Lorie Phenix, MD  [facility]

## 2015-07-18 ENCOUNTER — Other Ambulatory Visit: Payer: Self-pay | Admitting: Family Medicine

## 2015-07-18 DIAGNOSIS — G8929 Other chronic pain: Secondary | ICD-10-CM

## 2015-07-18 DIAGNOSIS — M545 Low back pain: Principal | ICD-10-CM

## 2015-07-18 MED ORDER — HYDROCODONE-ACETAMINOPHEN 5-325 MG PO TABS: 1.0000 | ORAL_TABLET | Freq: Two times a day (BID) | ORAL | Status: DC

## 2015-07-18 NOTE — Telephone Encounter (Signed)
Patient advised RX is at the front desk for pickup.  

## 2015-07-18 NOTE — Telephone Encounter (Signed)
Pt contacted office for refill request on the following medications: HYDROcodone-acetaminophen (NORCO/VICODIN) 5-325 MG per tablet.  Pt stated she needs it for back pain and that she is having trouble sleeping due to the pain. Last written on 11/16/14. Last OV on 05/31/15. Please advise. Thanks TNP

## 2015-07-18 NOTE — Telephone Encounter (Signed)
Prescription printed. Please notify patient it is ready for pick up. Thanks- Dr. Cleave Ternes.  

## 2015-07-30 ENCOUNTER — Ambulatory Visit (INDEPENDENT_AMBULATORY_CARE_PROVIDER_SITE_OTHER): Payer: Medicare Other | Admitting: Family Medicine

## 2015-07-30 ENCOUNTER — Encounter: Payer: Self-pay | Admitting: Family Medicine

## 2015-07-30 VITALS — BP 142/72 | HR 72 | Temp 97.6°F | Resp 16 | Wt 158.0 lb

## 2015-07-30 DIAGNOSIS — E119 Type 2 diabetes mellitus without complications: Secondary | ICD-10-CM | POA: Diagnosis not present

## 2015-07-30 DIAGNOSIS — R21 Rash and other nonspecific skin eruption: Secondary | ICD-10-CM | POA: Diagnosis not present

## 2015-07-30 DIAGNOSIS — I219 Acute myocardial infarction, unspecified: Secondary | ICD-10-CM | POA: Insufficient documentation

## 2015-07-30 MED ORDER — INSULIN GLARGINE 100 UNIT/ML SOLOSTAR PEN: 20.0000 [IU] | PEN_INJECTOR | Freq: Every evening | SUBCUTANEOUS | Status: DC

## 2015-07-30 MED ORDER — TRIAMCINOLONE ACETONIDE 0.1 % EX CREA: TOPICAL_CREAM | CUTANEOUS | Status: DC

## 2015-07-30 NOTE — Progress Notes (Signed)
Patient ID: Peggy [name], female   DOB: [birth year], 82 y.o.   MRN: [id number]         Patient: Peggy [name] Female    DOB: Jul 02, 1928   82 y.o.   MRN: [id number] Visit Date: 07/30/2015  Today's Provider: Lorie Phenix, MD   Chief Complaint  Patient presents with  . Diabetes   Subjective:    Rash This is a chronic problem. The current episode started more than 1 year ago. The problem has been gradually worsening since onset. The rash is diffuse. The rash is characterized by itchiness. It is unknown if there was an exposure to a precipitant. Associated symptoms include diarrhea (Chronic issue). Pertinent negatives include no fever or vomiting. Past treatments include antibiotic cream, antihistamine and topical steroids. The treatment provided no relief.      Diabetes Mellitus Type II, Follow-up:   Lab Results  Component Value Date   HGBA1C 8.1 05/31/2015   HGBA1C 8.0 03/01/2015   HGBA1C 7.5% 11/16/2014   Last seen for diabetes 2 months ago.  Management since then includes Increased Glipizide to 10mg  a day. She reports excellent compliance with treatment. She is having side effects.  Current symptoms include hyperglycemia, polyuria and pain in hands and feet. and have been worsening. Home blood sugar records: 200-250's sometimes as high as 260's  Episodes of hypoglycemia? no   Most Recent Eye Exam: 06/2015 Weight trend: stable Current diet: in general, a "healthy" diet   Current exercise: walking Pt walks on her treadmill some.  But not as often as she would like.   ------------------------------------------------------------------------         Allergies  Allergen Reactions  . Diphenhydramine Rash    AGITATION/DELIRIUM  . Metformin Diarrhea  . Oxybutynin Other (See Comments)    dizzy  . Amlodipine Rash  . Ciprofloxacin Rash  . Lisinopril Rash  . Penicillins Rash  . Saxagliptin Rash   Previous Medications   ASPIRIN 81 MG TABLET    Take 81 mg by mouth daily.     CETIRIZINE HCL (ZYRTEC ALLERGY) 10 MG CAPS    Take 1 capsule by mouth daily.    FLUTICASONE (FLONASE) 50 MCG/ACT NASAL SPRAY    U ONE SPRAY IEN D   GABAPENTIN (NEURONTIN) 300 MG CAPSULE    TAKE 1 TO 2 CAPSULES BY MOUTH AT BEDTIME AS DIRECTED   HYDROCODONE-ACETAMINOPHEN (NORCO/VICODIN) 5-325 MG TABLET    Take 1 tablet by mouth 2 (two) times daily. As needed for chronic low back pain   HYDROXYZINE (ATARAX/VISTARIL) 25 MG TABLET    Take 1 tablet (25 mg total) by mouth 2 (two) times daily as needed.   LEVOTHYROXINE (SYNTHROID, LEVOTHROID) 50 MCG TABLET    Take 1 tablet (50 mcg total) by mouth daily.   LORAZEPAM (ATIVAN) 0.5 MG TABLET    Take 1 tablet (0.5 mg total) by mouth 2 (two) times daily as needed for anxiety.   METOPROLOL SUCCINATE (TOPROL-XL) 50 MG 24 HR TABLET    Take 50 mg by mouth daily.    MONTELUKAST (SINGULAIR) 10 MG TABLET    Take 1 tablet (10 mg total) by mouth at bedtime.    Review of Systems  Constitutional: Negative for fever, chills, diaphoresis, activity change, appetite change and unexpected weight change.  Respiratory: Negative.   Cardiovascular: Negative.   Gastrointestinal: Positive for diarrhea (Chronic issue). Negative for nausea, vomiting, abdominal pain, constipation, blood in stool, abdominal distention, anal bleeding and rectal pain.  Endocrine: Negative for cold intolerance  and heat intolerance.  Musculoskeletal: Positive for myalgias (Muscle cramps; pt reports this is a chronic issue. ) and back pain. Negative for joint swelling, arthralgias, gait problem, neck pain and neck stiffness.  Skin: Positive for rash. Negative for color change and wound.  Neurological: Negative for light-headedness.  Psychiatric/Behavioral: Positive for sleep disturbance.    Social History  Substance Use Topics  . Smoking status: Former Smoker -- 1.00 packs/day for 30 years    Quit date: 11/30/1999  . Smokeless tobacco: Never Used  . Alcohol Use: No   Objective:   BP 142/72  mmHg  Pulse 72  Temp(Src) 97.6 F (36.4 C) (Oral)  Resp 16  Wt 158 lb (71.668 kg)  Physical Exam  Constitutional: She is oriented to person, place, and time. She appears well-developed and well-nourished.  Musculoskeletal:  Multiple inflamed maculopapular lesions noted, diffusely.   Neurological: She is alert and oriented to person, place, and time.  Psychiatric: She has a normal mood and affect. Her behavior is normal. Judgment and thought content normal.      Assessment & Plan:     1. Type 2 diabetes mellitus without complication, without long-term current use of insulin (HCC) Worsening. Not as goal. Will restart Lantus at 20 units and gradually increase. Recheck in 4 weeks. Patient instructed to call back if condition worsens or does not improve.    - Insulin Glargine (LANTUS SOLOSTAR) 100 UNIT/ML Solostar Pen; Inject 20 Units into the skin every evening. Increase by two units every 3 days until fasting sugars are in the 150's range.  Dispense: 5 pen; Refill: 3  2. Rash Worsening Stop Glipizide. May be making it worse.  Refilled cream. Patient instructed to call back if condition worsens or does not improve.    - triamcinolone cream (KENALOG) 0.1 %; APPLY TO AFFECTED RASH BID UNTIL CLEAR  Dispense: 453.6 g; Refill: 1   Patient was seen and examined by Leo Grosser, MD, and note scribed by Kavin Leech, CMA.  I have reviewed the document for accuracy and completeness and I agree with above. - Leo Grosser, MD      Lorie Phenix, MD  [facility]

## 2015-08-28 ENCOUNTER — Ambulatory Visit: Payer: Medicare Other | Admitting: Family Medicine

## 2015-09-04 ENCOUNTER — Ambulatory Visit (INDEPENDENT_AMBULATORY_CARE_PROVIDER_SITE_OTHER): Payer: Medicare Other | Admitting: Family Medicine

## 2015-09-04 ENCOUNTER — Encounter: Payer: Self-pay | Admitting: Family Medicine

## 2015-09-04 VITALS — BP 128/70 | HR 80 | Temp 97.6°F | Resp 16 | Wt 156.0 lb

## 2015-09-04 DIAGNOSIS — E7800 Pure hypercholesterolemia, unspecified: Secondary | ICD-10-CM

## 2015-09-04 DIAGNOSIS — I10 Essential (primary) hypertension: Secondary | ICD-10-CM

## 2015-09-04 DIAGNOSIS — R252 Cramp and spasm: Secondary | ICD-10-CM

## 2015-09-04 DIAGNOSIS — E119 Type 2 diabetes mellitus without complications: Secondary | ICD-10-CM | POA: Diagnosis not present

## 2015-09-04 LAB — POCT GLYCOSYLATED HEMOGLOBIN (HGB A1C): Hemoglobin A1C: 9.1

## 2015-09-04 LAB — POCT UA - MICROALBUMIN: MICROALBUMIN (UR) POC: 20 mg/L

## 2015-09-04 NOTE — Progress Notes (Signed)
Patient ID: Peggy [name], female   DOB: [birth year], 82 y.o.   MRN: [id number]         Patient: Peggy [name] Female    DOB: 1929-01-24   82 y.o.   MRN: [id number] Visit Date: 09/04/2015  Today's Provider: Lorie Phenix, MD   Chief Complaint  Patient presents with  . Hypertension  . Diabetes  . Hyperlipidemia   Subjective:    HPI    Diabetes Mellitus Type II, Follow-up:   Lab Results  Component Value Date   HGBA1C 8.1 05/31/2015   HGBA1C 8.0 03/01/2015   HGBA1C 7.5% 11/16/2014   Last seen for diabetes 4 months ago.  Management since then includes Stopped Glipizide and increased Lantus. She reports excellent compliance with treatment. She is not having side effects.  Current symptoms include hyperglycemia Pt reports her blood sugars have been running high.  and polyuria and have been unchanged. Home blood sugar records: High 100's - 300's  Episodes of hypoglycemia? no   Most Recent Eye Exam: 01/207 Weight trend: stable Current diet: in general, a "healthy" diet   Current exercise: walking  ------------------------------------------------------------------------   Hypertension, follow-up:  BP Readings from Last 3 Encounters:  09/04/15 128/70  07/30/15 142/72  05/31/15 138/62    She was last seen for hypertension 4 months ago.  Management since that visit includes None .She reports excellent compliance with treatment. She is not having side effects.  She is exercising. She is adherent to low salt diet.   Outside blood pressures are Pt reports her blood pressures are normal. She is experiencing none.  Patient denies chest pain, chest pressure/discomfort, dyspnea, fatigue and irregular heart beat.   Cardiovascular risk factors include advanced age (older than 66 for men, 72 for women), diabetes mellitus, dyslipidemia and hypertension.  ------------------------------------------------------------------------    Lipid/Cholesterol, Follow-up:   Last seen for  this 4 months ago.  Management since that visit includes None.  Last Lipid Panel:    Component Value Date/Time   CHOL 245* 10/07/2013 0438   TRIG 786* 10/07/2013 0438   HDL 29* 10/07/2013 0438   VLDL SEE COMMENT 10/07/2013 0438   LDLCALC SEE COMMENT 10/07/2013 0438     Wt Readings from Last 3 Encounters:  09/04/15 156 lb (70.761 kg)  07/30/15 158 lb (71.668 kg)  05/31/15 156 lb (70.761 kg)    ------------------------------------------------------------------------        Allergies  Allergen Reactions  . Diphenhydramine Rash    AGITATION/DELIRIUM  . Metformin Diarrhea  . Oxybutynin Other (See Comments)    dizzy  . Amlodipine Rash  . Ciprofloxacin Rash  . Lisinopril Rash  . Penicillins Rash  . Saxagliptin Rash   Previous Medications   ASPIRIN 81 MG TABLET    Take 81 mg by mouth daily.    CETIRIZINE HCL (ZYRTEC ALLERGY) 10 MG CAPS    Take 1 capsule by mouth daily.    DOXEPIN (SINEQUAN) 10 MG CAPSULE    Take 10 mg by mouth at bedtime.   FLUTICASONE (FLONASE) 50 MCG/ACT NASAL SPRAY    U ONE SPRAY IEN D   GABAPENTIN (NEURONTIN) 300 MG CAPSULE    TAKE 1 TO 2 CAPSULES BY MOUTH AT BEDTIME AS DIRECTED   HYDROCODONE-ACETAMINOPHEN (NORCO/VICODIN) 5-325 MG TABLET    Take 1 tablet by mouth 2 (two) times daily. As needed for chronic low back pain   HYDROXYZINE (ATARAX/VISTARIL) 25 MG TABLET    Take 1 tablet (25 mg total) by mouth 2 (two) times daily  as needed.   INSULIN GLARGINE (LANTUS SOLOSTAR) 100 UNIT/ML SOLOSTAR PEN    Inject 20 Units into the skin every evening. Increase by two units every 3 days until fasting sugars are in the 150's range.   LEVOTHYROXINE (SYNTHROID, LEVOTHROID) 50 MCG TABLET    Take 1 tablet (50 mcg total) by mouth daily.   LORAZEPAM (ATIVAN) 0.5 MG TABLET    Take 1 tablet (0.5 mg total) by mouth 2 (two) times daily as needed for anxiety.   METOPROLOL SUCCINATE (TOPROL-XL) 50 MG 24 HR TABLET    Take 50 mg by mouth daily.    MONTELUKAST (SINGULAIR) 10 MG  TABLET    Take 1 tablet (10 mg total) by mouth at bedtime.   OXYBUTYNIN (DITROPAN) 5 MG TABLET    Take 5 mg by mouth every evening.   TRIAMCINOLONE CREAM (KENALOG) 0.1 %    APPLY TO AFFECTED RASH BID UNTIL CLEAR    Review of Systems  Constitutional: Negative.   Respiratory: Negative.   Cardiovascular: Negative.   Gastrointestinal: Positive for diarrhea (Chronic Issue). Negative for nausea, vomiting, abdominal pain, constipation, blood in stool, abdominal distention, anal bleeding and rectal pain.  Musculoskeletal: Negative.   Neurological: Negative for light-headedness.    Social History  Substance Use Topics  . Smoking status: Former Smoker -- 1.00 packs/day for 30 years    Quit date: 11/30/1999  . Smokeless tobacco: Never Used  . Alcohol Use: No   Objective:   BP 128/70 mmHg  Pulse 80  Temp(Src) 97.6 F (36.4 C) (Oral)  Resp 16  Wt 156 lb (70.761 kg)   Physical Exam  Constitutional: She is oriented to person, place, and time. She appears well-developed and well-nourished.  Cardiovascular: Normal rate, regular rhythm and normal heart sounds.   Pulmonary/Chest: Effort normal and breath sounds normal.  Neurological: She is alert and oriented to person, place, and time.  Psychiatric: She has a normal mood and affect. Her behavior is normal. Judgment and thought content normal.      Assessment & Plan:      1. Essential (primary) hypertension Stable will check labs.  - CBC with Differential/Platelet - Comprehensive metabolic panel - TSH  2. Type 2 diabetes mellitus without complication, without long-term current use of insulin (HCC) A1C elevated at 9.1%, will continue to increase her Lantus by two units until she is at goal.   - POCT glycosylated hemoglobin (Hb A1C) - POCT UA - Microalbumin - Comprehensive metabolic panel Results for orders placed or performed in visit on 09/04/15  POCT glycosylated hemoglobin (Hb A1C)  Result Value Ref Range   Hemoglobin A1C 9.1     POCT UA - Microalbumin  Result Value Ref Range   Microalbumin Ur, POC 20 mg/L    3. Hypercholesteremia Stable Will check labs.  - Comprehensive metabolic panel - Lipid panel  4. Cramp of both lower extremities Worsening will check labs.  - Magnesium - Ferritin     Patient was seen and examined by Leo Grosser, MD, and note scribed by Kavin Leech, CMA.  I have reviewed the document for accuracy and completeness and I agree with above. - Leo Grosser, MD   Lorie Phenix, MD  [facility]

## 2015-09-05 ENCOUNTER — Telehealth: Payer: Self-pay

## 2015-09-05 DIAGNOSIS — E781 Pure hyperglyceridemia: Secondary | ICD-10-CM

## 2015-09-05 LAB — CBC WITH DIFFERENTIAL/PLATELET
BASOS ABS: 0.1 10*3/uL (ref 0.0–0.2)
BASOS: 1 %
EOS (ABSOLUTE): 0.4 10*3/uL (ref 0.0–0.4)
Eos: 4 %
HEMOGLOBIN: 14.2 g/dL (ref 11.1–15.9)
Hematocrit: 44 % (ref 34.0–46.6)
IMMATURE GRANS (ABS): 0 10*3/uL (ref 0.0–0.1)
Immature Granulocytes: 1 %
LYMPHS ABS: 1.8 10*3/uL (ref 0.7–3.1)
LYMPHS: 21 %
MCH: 27.6 pg (ref 26.6–33.0)
MCHC: 32.3 g/dL (ref 31.5–35.7)
MCV: 86 fL (ref 79–97)
MONOCYTES: 6 %
Monocytes Absolute: 0.5 10*3/uL (ref 0.1–0.9)
NEUTROS ABS: 6 10*3/uL (ref 1.4–7.0)
Neutrophils: 67 %
Platelets: 247 10*3/uL (ref 150–379)
RBC: 5.14 x10E6/uL (ref 3.77–5.28)
RDW: 13.3 % (ref 12.3–15.4)
WBC: 8.8 10*3/uL (ref 3.4–10.8)

## 2015-09-05 LAB — COMPREHENSIVE METABOLIC PANEL
A/G RATIO: 1.8 (ref 1.2–2.2)
ALBUMIN: 4.2 g/dL (ref 3.5–4.7)
ALK PHOS: 70 IU/L (ref 39–117)
ALT: 23 IU/L (ref 0–32)
AST: 23 IU/L (ref 0–40)
BILIRUBIN TOTAL: 0.4 mg/dL (ref 0.0–1.2)
BUN / CREAT RATIO: 21 (ref 12–28)
BUN: 18 mg/dL (ref 8–27)
CHLORIDE: 99 mmol/L (ref 96–106)
CO2: 23 mmol/L (ref 18–29)
Calcium: 9.7 mg/dL (ref 8.7–10.3)
Creatinine, Ser: 0.87 mg/dL (ref 0.57–1.00)
GFR calc non Af Amer: 61 mL/min/{1.73_m2} (ref >59)
GFR, EST AFRICAN AMERICAN: 71 mL/min/{1.73_m2} (ref >59)
GLUCOSE: 187 mg/dL — AB (ref 65–99)
Globulin, Total: 2.4 g/dL (ref 1.5–4.5)
Potassium: 5.1 mmol/L (ref 3.5–5.2)
Sodium: 141 mmol/L (ref 134–144)
TOTAL PROTEIN: 6.6 g/dL (ref 6.0–8.5)

## 2015-09-05 LAB — LIPID PANEL
CHOL/HDL RATIO: 8.2 ratio — AB (ref 0.0–4.4)
Cholesterol, Total: 272 mg/dL — ABNORMAL HIGH (ref 100–199)
HDL: 33 mg/dL — AB (ref >39)
Triglycerides: 722 mg/dL (ref 0–149)

## 2015-09-05 LAB — TSH: TSH: 1.41 u[IU]/mL (ref 0.450–4.500)

## 2015-09-05 LAB — FERRITIN: FERRITIN: 93 ng/mL (ref 15–150)

## 2015-09-05 LAB — MAGNESIUM: MAGNESIUM: 1.9 mg/dL (ref 1.6–2.3)

## 2015-09-05 NOTE — Telephone Encounter (Signed)
-----   Message from Lorie Phenix, MD sent at 09/05/2015 10:59 AM EDT ----- Stable except very high triglycerides. Can start a statin or trial of a medication call [facility]. Thanks.

## 2015-09-05 NOTE — Telephone Encounter (Signed)
Tried calling pt. No answer and could not leave message. Will try again later. Allene Dillon, CMA

## 2015-09-06 ENCOUNTER — Other Ambulatory Visit: Payer: Self-pay | Admitting: Family Medicine

## 2015-09-06 DIAGNOSIS — I10 Essential (primary) hypertension: Secondary | ICD-10-CM

## 2015-09-10 MED ORDER — FENOFIBRATE 145 MG PO TABS: 145.0000 mg | ORAL_TABLET | Freq: Every day | ORAL | Status: DC

## 2015-09-10 NOTE — Telephone Encounter (Signed)
Sent rx. Have patient have labs rechecked when she follows up with new provider. Thanks.

## 2015-09-10 NOTE — Telephone Encounter (Signed)
Pt advised, patient is willing to start medication and she does not have a preference. I looked in alscripts and she had severe muscle pain on Pravastatin in 2015 but no other medications have been tried that i could tell. Advised patient to check with pharmacy later on the new medication RX-aa

## 2015-09-14 ENCOUNTER — Other Ambulatory Visit: Payer: Self-pay | Admitting: Family Medicine

## 2015-09-14 DIAGNOSIS — E119 Type 2 diabetes mellitus without complications: Secondary | ICD-10-CM

## 2015-09-14 MED ORDER — INSULIN GLARGINE 100 UNIT/ML SOLOSTAR PEN: 20.0000 [IU] | PEN_INJECTOR | Freq: Every evening | SUBCUTANEOUS | Status: DC

## 2015-09-14 NOTE — Telephone Encounter (Signed)
RX sent in and daughter Noralee Chars

## 2015-09-14 NOTE — Telephone Encounter (Signed)
Ok to call in one refill. Thanks

## 2015-09-14 NOTE — Telephone Encounter (Signed)
[place] the [place] and forgot to take her Lantus Pen with her.  She needs 1 called into [facility] in [place]  To [phone]

## 2015-09-19 ENCOUNTER — Other Ambulatory Visit: Payer: Self-pay | Admitting: Family Medicine

## 2015-09-19 DIAGNOSIS — M1990 Unspecified osteoarthritis, unspecified site: Secondary | ICD-10-CM

## 2015-09-24 ENCOUNTER — Other Ambulatory Visit: Payer: Self-pay

## 2015-09-24 DIAGNOSIS — L299 Pruritus, unspecified: Secondary | ICD-10-CM

## 2015-09-27 ENCOUNTER — Other Ambulatory Visit: Payer: Self-pay | Admitting: Family Medicine

## 2015-09-27 DIAGNOSIS — J3089 Other allergic rhinitis: Secondary | ICD-10-CM

## 2015-09-27 NOTE — Telephone Encounter (Signed)
Pt is requesting a Rx for the mini 38mmx31g needles for the Solostar pens.  [facility]  [name].  [phone]

## 2015-09-27 NOTE — Telephone Encounter (Signed)
Refill called in to pharmacy

## 2015-10-12 ENCOUNTER — Telehealth: Payer: Self-pay | Admitting: Family Medicine

## 2015-10-12 NOTE — Telephone Encounter (Signed)
Please review, do you call in RX without appointment, ?-aa

## 2015-10-12 NOTE — Telephone Encounter (Signed)
Can add Mucinex 600 mg twice a day. Is OTC. Thanks.

## 2015-10-12 NOTE — Telephone Encounter (Signed)
Pt states she is having a headache and having sinus congestion.  Pt is requesting a Rx to help with this. Pt did not want to schedule an appointment. [facility] in [place].  CB#[phone]/MW

## 2015-10-12 NOTE — Telephone Encounter (Signed)
Please triage.  Sinus infection or allergies. Usually can not treat over the phone. Thanks.

## 2015-10-12 NOTE — Telephone Encounter (Signed)
Left message informing pt. Peggy [name], CMA  

## 2015-10-12 NOTE — Telephone Encounter (Signed)
Spoke with pt. She reports that her symptoms started on Wednesday (2 days ago). She just has nasal congestion and headache, no cough. Her nasal discharge has been a little bloody, but not green or yellow. Informed her of below, she says that she is going out of town when I offered her to come in for Saturday clinic. Pt does have seasonal allergies.

## 2015-12-12 ENCOUNTER — Ambulatory Visit: Payer: Medicare Other | Admitting: Physician Assistant

## 2015-12-31 ENCOUNTER — Other Ambulatory Visit: Payer: Self-pay | Admitting: Family Medicine

## 2016-02-01 ENCOUNTER — Other Ambulatory Visit: Payer: Self-pay | Admitting: Family Medicine

## 2016-02-01 DIAGNOSIS — E039 Hypothyroidism, unspecified: Secondary | ICD-10-CM

## 2016-02-01 NOTE — Telephone Encounter (Signed)
Peggy [name] Pt.   Thanks,   -Peggy [name]  

## 2016-03-14 ENCOUNTER — Other Ambulatory Visit: Payer: Self-pay | Admitting: Family Medicine

## 2016-03-14 DIAGNOSIS — R21 Rash and other nonspecific skin eruption: Secondary | ICD-10-CM

## 2016-04-14 ENCOUNTER — Other Ambulatory Visit: Payer: Self-pay | Admitting: Family Medicine

## 2016-04-14 DIAGNOSIS — E119 Type 2 diabetes mellitus without complications: Secondary | ICD-10-CM

## 2016-05-21 ENCOUNTER — Other Ambulatory Visit: Payer: Self-pay | Admitting: Family Medicine

## 2016-05-21 DIAGNOSIS — E039 Hypothyroidism, unspecified: Secondary | ICD-10-CM

## 2016-05-21 NOTE — Telephone Encounter (Signed)
Patient reports she is under the care of Dr. Dareen Piano at [facility].

## 2016-05-21 NOTE — Telephone Encounter (Signed)
Patient is overdue for office visit for follow up diabetes thyroid and cholesterol need to schedule before refilling medications.

## 2016-06-04 ENCOUNTER — Other Ambulatory Visit: Payer: Self-pay | Admitting: Family Medicine

## 2016-06-04 DIAGNOSIS — E039 Hypothyroidism, unspecified: Secondary | ICD-10-CM

## 2016-06-04 NOTE — Telephone Encounter (Signed)
LMTCB on patient's home number. Called mobile number in chart which was patient's daughter. Advised daughter patient needed a follow up appointment. She said she will contact patient to call back and schedule a follow up.

## 2016-06-04 NOTE — Telephone Encounter (Signed)
Patient is no longer a patient here. She states she switched to Dr. Dareen Piano when Dr. Elease Hashimoto left. She has notified pharmacy, but they continue sending refill request here.

## 2016-06-04 NOTE — Telephone Encounter (Signed)
This patient is long overdue for diabetes follow. Needs office visit before we can approve refills.

## 2016-07-29 ENCOUNTER — Other Ambulatory Visit: Payer: Self-pay | Admitting: Physician Assistant

## 2016-07-29 DIAGNOSIS — R21 Rash and other nonspecific skin eruption: Secondary | ICD-10-CM

## 2016-08-31 ENCOUNTER — Emergency Department
Admission: EM | Admit: 2016-08-31 | Discharge: 2016-08-31 | Discharge status: Absent without leave | Payer: Medicare Other

## 2016-09-03 ENCOUNTER — Other Ambulatory Visit: Payer: Self-pay

## 2016-09-03 DIAGNOSIS — E119 Type 2 diabetes mellitus without complications: Secondary | ICD-10-CM

## 2016-09-03 MED ORDER — "INSULIN SYRINGE-NEEDLE U-100 29G X 1/2"" 0.5 ML MISC": 5 refills | Status: DC

## 2016-09-03 NOTE — Telephone Encounter (Signed)
Pharmacy requesting refills. Thanks!  

## 2016-11-27 DIAGNOSIS — M79602 Pain in left arm: Secondary | ICD-10-CM

## 2016-11-27 DIAGNOSIS — M79601 Pain in right arm: Secondary | ICD-10-CM | POA: Insufficient documentation

## 2016-12-09 ENCOUNTER — Inpatient Hospital Stay: Payer: Medicare Other

## 2016-12-09 ENCOUNTER — Observation Stay
Admission: EM | Admit: 2016-12-09 | Discharge: 2016-12-10 | Disposition: A | Discharge status: Home / Self Care | Payer: Medicare Other | Attending: Internal Medicine | Admitting: Internal Medicine

## 2016-12-09 ENCOUNTER — Encounter: Payer: Self-pay | Admitting: Emergency Medicine

## 2016-12-09 ENCOUNTER — Emergency Department: Payer: Medicare Other

## 2016-12-09 DIAGNOSIS — I639 Cerebral infarction, unspecified: Secondary | ICD-10-CM | POA: Diagnosis present

## 2016-12-09 DIAGNOSIS — R918 Other nonspecific abnormal finding of lung field: Secondary | ICD-10-CM | POA: Insufficient documentation

## 2016-12-09 DIAGNOSIS — Z794 Long term (current) use of insulin: Secondary | ICD-10-CM | POA: Insufficient documentation

## 2016-12-09 DIAGNOSIS — G459 Transient cerebral ischemic attack, unspecified: Principal | ICD-10-CM | POA: Diagnosis present

## 2016-12-09 DIAGNOSIS — R748 Abnormal levels of other serum enzymes: Secondary | ICD-10-CM | POA: Diagnosis present

## 2016-12-09 DIAGNOSIS — Z7951 Long term (current) use of inhaled steroids: Secondary | ICD-10-CM | POA: Insufficient documentation

## 2016-12-09 DIAGNOSIS — Z7982 Long term (current) use of aspirin: Secondary | ICD-10-CM | POA: Diagnosis not present

## 2016-12-09 DIAGNOSIS — R0609 Other forms of dyspnea: Secondary | ICD-10-CM | POA: Diagnosis present

## 2016-12-09 DIAGNOSIS — M1990 Unspecified osteoarthritis, unspecified site: Secondary | ICD-10-CM | POA: Diagnosis not present

## 2016-12-09 DIAGNOSIS — G8929 Other chronic pain: Secondary | ICD-10-CM | POA: Insufficient documentation

## 2016-12-09 DIAGNOSIS — M545 Low back pain: Secondary | ICD-10-CM | POA: Insufficient documentation

## 2016-12-09 DIAGNOSIS — I700 Atherosclerosis of aorta: Secondary | ICD-10-CM | POA: Insufficient documentation

## 2016-12-09 DIAGNOSIS — I2510 Atherosclerotic heart disease of native coronary artery without angina pectoris: Secondary | ICD-10-CM | POA: Insufficient documentation

## 2016-12-09 DIAGNOSIS — E785 Hyperlipidemia, unspecified: Secondary | ICD-10-CM | POA: Insufficient documentation

## 2016-12-09 DIAGNOSIS — Z87891 Personal history of nicotine dependence: Secondary | ICD-10-CM | POA: Diagnosis not present

## 2016-12-09 DIAGNOSIS — E039 Hypothyroidism, unspecified: Secondary | ICD-10-CM | POA: Diagnosis not present

## 2016-12-09 DIAGNOSIS — K219 Gastro-esophageal reflux disease without esophagitis: Secondary | ICD-10-CM | POA: Insufficient documentation

## 2016-12-09 DIAGNOSIS — F419 Anxiety disorder, unspecified: Secondary | ICD-10-CM | POA: Diagnosis not present

## 2016-12-09 DIAGNOSIS — I4891 Unspecified atrial fibrillation: Secondary | ICD-10-CM | POA: Diagnosis not present

## 2016-12-09 DIAGNOSIS — I10 Essential (primary) hypertension: Secondary | ICD-10-CM

## 2016-12-09 DIAGNOSIS — I252 Old myocardial infarction: Secondary | ICD-10-CM | POA: Diagnosis not present

## 2016-12-09 DIAGNOSIS — Z880 Allergy status to penicillin: Secondary | ICD-10-CM | POA: Insufficient documentation

## 2016-12-09 DIAGNOSIS — E119 Type 2 diabetes mellitus without complications: Secondary | ICD-10-CM | POA: Insufficient documentation

## 2016-12-09 DIAGNOSIS — R778 Other specified abnormalities of plasma proteins: Secondary | ICD-10-CM

## 2016-12-09 DIAGNOSIS — R7989 Other specified abnormal findings of blood chemistry: Secondary | ICD-10-CM

## 2016-12-09 DIAGNOSIS — Z66 Do not resuscitate: Secondary | ICD-10-CM | POA: Insufficient documentation

## 2016-12-09 HISTORY — DX: Unspecified atrial fibrillation: I48.91

## 2016-12-09 HISTORY — DX: Transient cerebral ischemic attack, unspecified: G45.9

## 2016-12-09 LAB — COMPREHENSIVE METABOLIC PANEL
ALT: 20 U/L (ref 14–54)
ANION GAP: 9 (ref 5–15)
AST: 22 U/L (ref 15–41)
Albumin: 3.9 g/dL (ref 3.5–5.0)
Alkaline Phosphatase: 44 U/L (ref 38–126)
BILIRUBIN TOTAL: 0.5 mg/dL (ref 0.3–1.2)
BUN: 30 mg/dL — AB (ref 6–20)
CALCIUM: 9.6 mg/dL (ref 8.9–10.3)
CO2: 27 mmol/L (ref 22–32)
Chloride: 102 mmol/L (ref 101–111)
Creatinine, Ser: 0.86 mg/dL (ref 0.44–1.00)
GFR calc Af Amer: >60 mL/min (ref >60)
GFR, EST NON AFRICAN AMERICAN: 60 mL/min — AB (ref >60)
Glucose, Bld: 324 mg/dL — ABNORMAL HIGH (ref 65–99)
POTASSIUM: 4.4 mmol/L (ref 3.5–5.1)
Sodium: 138 mmol/L (ref 135–145)
TOTAL PROTEIN: 6.7 g/dL (ref 6.5–8.1)

## 2016-12-09 LAB — DIFFERENTIAL
Basophils Absolute: 0.1 10*3/uL (ref 0–0.1)
Basophils Relative: 1 %
EOS ABS: 0.7 10*3/uL (ref 0–0.7)
EOS PCT: 9 %
LYMPHS ABS: 1.7 10*3/uL (ref 1.0–3.6)
Lymphocytes Relative: 21 %
MONOS PCT: 5 %
Monocytes Absolute: 0.4 10*3/uL (ref 0.2–0.9)
Neutro Abs: 5.1 10*3/uL (ref 1.4–6.5)
Neutrophils Relative %: 64 %

## 2016-12-09 LAB — TROPONIN I: TROPONIN I: 0.77 ng/mL — AB (ref <0.03)

## 2016-12-09 LAB — GLUCOSE, CAPILLARY: GLUCOSE-CAPILLARY: 267 mg/dL — AB (ref 65–99)

## 2016-12-09 LAB — CBC
HEMATOCRIT: 40.7 % (ref 35.0–47.0)
HEMOGLOBIN: 13.4 g/dL (ref 12.0–16.0)
MCH: 27.3 pg (ref 26.0–34.0)
MCHC: 32.8 g/dL (ref 32.0–36.0)
MCV: 83.1 fL (ref 80.0–100.0)
Platelets: 272 10*3/uL (ref 150–440)
RBC: 4.9 MIL/uL (ref 3.80–5.20)
RDW: 14.2 % (ref 11.5–14.5)
WBC: 8 10*3/uL (ref 3.6–11.0)

## 2016-12-09 LAB — APTT: aPTT: 30 seconds (ref 24–36)

## 2016-12-09 LAB — PROTIME-INR
INR: 1.13
Prothrombin Time: 14.6 seconds (ref 11.4–15.2)

## 2016-12-09 MED ORDER — FENOFIBRATE 54 MG PO TABS
54.0000 mg | ORAL_TABLET | Freq: Every day | ORAL | Status: DC
  Administered 2016-12-10: 54 mg via ORAL
  Filled 2016-12-09: qty 1

## 2016-12-09 MED ORDER — INSULIN ASPART PROT & ASPART (70-30 MIX) 100 UNIT/ML ~~LOC~~ SUSP
25.0000 [IU] | Freq: Every day | SUBCUTANEOUS | Status: DC
Start: 2016-12-10 — End: 2016-12-10

## 2016-12-09 MED ORDER — ASPIRIN EC 81 MG PO TBEC
81.0000 mg | DELAYED_RELEASE_TABLET | Freq: Every day | ORAL | Status: DC
  Administered 2016-12-10: 81 mg via ORAL
  Filled 2016-12-09: qty 1

## 2016-12-09 MED ORDER — ASPIRIN 81 MG PO CHEW
324.0000 mg | CHEWABLE_TABLET | Freq: Once | ORAL | Status: AC
  Administered 2016-12-09: 324 mg via ORAL
  Filled 2016-12-09: qty 4

## 2016-12-09 MED ORDER — OXYBUTYNIN CHLORIDE 5 MG PO TABS
5.0000 mg | ORAL_TABLET | Freq: Every evening | ORAL | Status: DC
  Administered 2016-12-09: 23:00:00 5 mg via ORAL
  Filled 2016-12-09: qty 1

## 2016-12-09 MED ORDER — ONDANSETRON HCL 4 MG/2ML IJ SOLN: 4.0000 mg | Freq: Four times a day (QID) | INTRAMUSCULAR | Status: DC | PRN

## 2016-12-09 MED ORDER — ONDANSETRON HCL 4 MG PO TABS: 4.0000 mg | ORAL_TABLET | Freq: Four times a day (QID) | ORAL | Status: DC | PRN

## 2016-12-09 MED ORDER — POLYETHYLENE GLYCOL 3350 17 G PO PACK: 17.0000 g | PACK | Freq: Every day | ORAL | Status: DC | PRN

## 2016-12-09 MED ORDER — METOPROLOL SUCCINATE ER 50 MG PO TB24
50.0000 mg | ORAL_TABLET | Freq: Every day | ORAL | Status: DC
  Administered 2016-12-10: 10:00:00 50 mg via ORAL
  Filled 2016-12-09: qty 1

## 2016-12-09 MED ORDER — POTASSIUM 99 MG PO TABS: 1.0000 | ORAL_TABLET | Freq: Every day | ORAL | Status: DC

## 2016-12-09 MED ORDER — INSULIN ASPART 100 UNIT/ML ~~LOC~~ SOLN
0.0000 [IU] | Freq: Every day | SUBCUTANEOUS | Status: DC
  Administered 2016-12-09: 23:00:00 3 [IU] via SUBCUTANEOUS
  Filled 2016-12-09: qty 1

## 2016-12-09 MED ORDER — ACETAMINOPHEN 650 MG RE SUPP: 650.0000 mg | Freq: Four times a day (QID) | RECTAL | Status: DC | PRN

## 2016-12-09 MED ORDER — ATORVASTATIN CALCIUM 20 MG PO TABS: 40.0000 mg | ORAL_TABLET | Freq: Every day | ORAL | Status: DC

## 2016-12-09 MED ORDER — HYDRALAZINE HCL 20 MG/ML IJ SOLN
10.0000 mg | Freq: Four times a day (QID) | INTRAMUSCULAR | Status: DC | PRN
Start: 2016-12-09 — End: 2016-12-10

## 2016-12-09 MED ORDER — LEVOTHYROXINE SODIUM 50 MCG PO TABS
50.0000 ug | ORAL_TABLET | Freq: Every day | ORAL | Status: DC
  Administered 2016-12-10: 50 ug via ORAL
  Filled 2016-12-09: qty 1

## 2016-12-09 MED ORDER — LORAZEPAM 0.5 MG PO TABS
0.5000 mg | ORAL_TABLET | Freq: Two times a day (BID) | ORAL | Status: DC | PRN
  Administered 2016-12-10: 01:00:00 0.5 mg via ORAL
  Filled 2016-12-09: qty 1

## 2016-12-09 MED ORDER — ACETAMINOPHEN 325 MG PO TABS: 650.0000 mg | ORAL_TABLET | Freq: Four times a day (QID) | ORAL | Status: DC | PRN

## 2016-12-09 MED ORDER — ENOXAPARIN SODIUM 40 MG/0.4ML ~~LOC~~ SOLN
40.0000 mg | SUBCUTANEOUS | Status: DC
  Administered 2016-12-09: 40 mg via SUBCUTANEOUS
  Filled 2016-12-09: qty 0.4

## 2016-12-09 MED ORDER — HYDROCODONE-ACETAMINOPHEN 5-325 MG PO TABS
1.0000 | ORAL_TABLET | Freq: Two times a day (BID) | ORAL | Status: DC
  Administered 2016-12-09 – 2016-12-10 (×2): 1 via ORAL
  Filled 2016-12-09 (×2): qty 1

## 2016-12-09 MED ORDER — INSULIN ASPART 100 UNIT/ML ~~LOC~~ SOLN
0.0000 [IU] | Freq: Three times a day (TID) | SUBCUTANEOUS | Status: DC
  Administered 2016-12-10: 09:00:00 3 [IU] via SUBCUTANEOUS
  Administered 2016-12-10: 12:00:00 5 [IU] via SUBCUTANEOUS
  Filled 2016-12-09 (×2): qty 1

## 2016-12-09 MED ORDER — ALBUTEROL SULFATE (2.5 MG/3ML) 0.083% IN NEBU: 2.5000 mg | INHALATION_SOLUTION | RESPIRATORY_TRACT | Status: DC | PRN

## 2016-12-09 MED ORDER — LORAZEPAM 2 MG/ML IJ SOLN: 1.0000 mg | Freq: Once | INTRAMUSCULAR | Status: DC

## 2016-12-09 MED ORDER — IBUPROFEN 400 MG PO TABS: 400.0000 mg | ORAL_TABLET | Freq: Four times a day (QID) | ORAL | Status: DC | PRN

## 2016-12-09 NOTE — ED Notes (Signed)
Admitting Provider at bedside. 

## 2016-12-09 NOTE — H&P (Signed)
[facility] [place] [facility]   PATIENT NAME: Peggy [name]    MR#:  [id number]  DATE OF BIRTH:  August 14, 1928  DATE OF ADMISSION:  12/09/2016  PRIMARY CARE PHYSICIAN: Margaretann Loveless, PA-C   REQUESTING/REFERRING PHYSICIAN: [place]  CHIEF COMPLAINT:   Chief Complaint  Patient presents with  . Altered Mental Status   Right weakness, slurred speech  HISTORY OF PRESENT ILLNESS:  Peggy [name]  is a 83 y.o. female with a known history of Hypertension, diabetes, atrial fibrillation, lower GI bleed presents to the emergency room complaining of right-sided weakness and slurred speech yesterday evening. Time of onset greater than 30 hours. Patient's symptoms are significantly improved but her speech is mildly slurred with some residual right-sided weakness. She tried to use a cane at home and had difficulty walking. She does not have any dysphagia or change in vision. No numbness or tingling. She has noticed on and off shortness of breath with exertion over the last 3 weeks. Saw her cardiologist for the same on 11/27/2016. Patient had a normal stress test 1 year back. Here in the emergency room CT scan of the head has been normal. Troponin on be elevated at 0.77.  PAST MEDICAL HISTORY:   Past Medical History:  Diagnosis Date  . A-fib (HCC)   . Allergy   . Arthritis   . Diabetes mellitus without complication (HCC)   . GERD (gastroesophageal reflux disease)   . Hyperlipidemia   . Hypertension   . TIA (transient ischemic attack)     PAST SURGICAL HISTORY:   Past Surgical History:  Procedure Laterality Date  . ABDOMINAL HYSTERECTOMY  1970  . BACK SURGERY  2013  . colectomy Right 10/08/2013   Dr. Egbert Garibaldi  . CORONARY ANGIOPLASTY WITH STENT PLACEMENT      SOCIAL HISTORY:   Social History  Substance Use Topics  . Smoking status: Former Smoker    Packs/day: 1.00    Years: 30.00    Quit date: 11/30/1999  . Smokeless tobacco: Never Used  . Alcohol use No     FAMILY HISTORY:   Family History  Problem Relation Age of Onset  . Breast cancer Mother   . Pancreatitis Father   . Breast cancer Sister   . Lung cancer Brother   . Melanoma Brother   . Throat cancer Brother     DRUG ALLERGIES:   Allergies  Allergen Reactions  . Diphenhydramine Rash    AGITATION/DELIRIUM  . Metformin Diarrhea  . Oxybutynin Other (See Comments)    dizzy  . Amlodipine Rash  . Ciprofloxacin Rash  . Lisinopril Rash  . Penicillins Rash  . Saxagliptin Rash    REVIEW OF SYSTEMS:   Review of Systems  Constitutional: Positive for malaise/fatigue. Negative for chills, fever and weight loss.  HENT: Negative for hearing loss and nosebleeds.   Eyes: Negative for blurred vision, double vision and pain.  Respiratory: Positive for shortness of breath. Negative for cough, hemoptysis, sputum production and wheezing.   Cardiovascular: Negative for chest pain, palpitations, orthopnea and leg swelling.  Gastrointestinal: Negative for abdominal pain, constipation, diarrhea, nausea and vomiting.  Genitourinary: Negative for dysuria and hematuria.  Musculoskeletal: Negative for back pain, falls and myalgias.  Skin: Negative for rash.  Neurological: Positive for speech change, focal weakness and weakness. Negative for dizziness, tremors, sensory change, seizures and headaches.  Endo/Heme/Allergies: Does not bruise/bleed easily.  Psychiatric/Behavioral: Negative for depression and memory loss. The patient is not nervous/anxious.  MEDICATIONS AT HOME:   Prior to Admission medications   Medication Sig Start Date End Date Taking? Authorizing Provider  acetaminophen (TYLENOL) 650 MG CR tablet Take 650 mg by mouth every 8 (eight) hours as needed for pain.   Yes [provider]  aspirin 81 MG tablet Take 81 mg by mouth daily.    Yes [provider]  fenofibrate (TRICOR) 145 MG tablet Take 1 tablet (145 mg total) by mouth daily. 09/10/15  Yes Lorie Phenix, MD  fluocinonide ointment (LIDEX) 0.05 % Apply 1 application topically daily as needed. 11/17/16  Yes [provider]  fluticasone [name]) 50 MCG/ACT nasal spray USE ONE SPRAY IN EACH NOSTRIL DAILY 09/27/15  Yes Lorie Phenix, MD  HYDROcodone-acetaminophen (NORCO/VICODIN) 5-325 MG tablet Take 1 tablet by mouth 2 (two) times daily. As needed for chronic low back pain 07/18/15  Yes Lorie Phenix, MD  hydrOXYzine (ATARAX/VISTARIL) 25 MG tablet Take 1 tablet (25 mg total) by mouth 2 (two) times daily as needed. 11/16/14  Yes Lorie Phenix, MD  insulin NPH-regular Human (NOVOLIN 70/30) (70-30) 100 UNIT/ML injection Inject 32 Units into the skin at bedtime.   Yes [provider]  levothyroxine (SYNTHROID, LEVOTHROID) 50 MCG tablet Take 1 tablet (50 mcg total) by mouth daily. PATIENT NEEDS TO SCHEDULE OFFICE VISIT FOR FOLLOW UP 02/01/16  Yes Malva Limes, MD  Melatonin 1 MG TABS Take 1 tablet by mouth at bedtime as needed. Enhanced Sleep with melatonin. Active ingredient [place] 200mg  and 1.5 of melatonin   Yes [provider]  metoprolol succinate (TOPROL-XL) 50 MG 24 hr tablet TAKE 1 TABLET BY MOUTH EVERY DAY Patient taking differently: TAKE 0.5 TABLET BY MOUTH EVERY DAY 09/06/15  Yes Lorie Phenix, MD  Multiple Vitamin (MULTIVITAMIN) tablet Take 1 tablet by mouth daily.   Yes [provider]  Potassium 99 MG TABS Take 1 tablet by mouth daily.   Yes [provider]  doxepin (SINEQUAN) 10 MG capsule TAKE 1 CAPSULE BY MOUTH EVERY NIGHT AT BEDTIME Patient not taking: Reported on 12/09/2016 12/31/15   Margaretann Loveless, PA-C  gabapentin (NEURONTIN) 300 MG capsule TAKE 1 TO 2 CAPSULES BY MOUTH EVERY NIGHT AT BEDTIME AS DIRECTED Patient not taking: Reported on 12/09/2016 09/19/15   Lorie Phenix, MD  Insulin Glargine (LANTUS SOLOSTAR) 100 UNIT/ML Solostar Pen Administer 20 units under skin every evening.  Increase by 2 units every 3 days until fasting sugars  are in the 150's range. Patient not taking: Reported on 12/09/2016 04/15/16   Margaretann Loveless, PA-C  Insulin Syringe-Needle U-100 29G X 1/2" 0.5 ML MISC Use as directed with insulin. 09/03/16   Margaretann Loveless, PA-C  LORazepam (ATIVAN) 0.5 MG tablet Take 1 tablet (0.5 mg total) by mouth 2 (two) times daily as needed for anxiety. Patient not taking: Reported on 12/09/2016 01/26/15   Lorie Phenix, MD  montelukast (SINGULAIR) 10 MG tablet Take 1 tablet (10 mg total) by mouth at bedtime. Patient not taking: Reported on 12/09/2016 05/17/15   Lorie Phenix, MD  oxybutynin (DITROPAN) 5 MG tablet Take 5 mg by mouth every evening.    [provider]  triamcinolone cream (KENALOG) 0.1 % APPLY AFFECTED RASH TWICE DAILY UNTIL CLEAR. Patient not taking: Reported on 12/09/2016 07/29/16   Margaretann Loveless, PA-C     VITAL SIGNS:  Blood pressure (!) 174/64, pulse 78, temperature (!) 97.5 F (36.4 C), resp. rate (!) 22, height 5\' 3"  (1.6 m), weight 70.8 kg (156 lb),  SpO2 97 %.  PHYSICAL EXAMINATION:  Physical Exam  GENERAL:  83 y.o.-year-old patient lying in the bed with no acute distress.  EYES: Pupils equal, round, reactive to light and accommodation. No scleral icterus. Extraocular muscles intact.  HEENT: Head atraumatic, normocephalic. Oropharynx and nasopharynx clear. No oropharyngeal erythema, moist oral mucosa  NECK:  Supple, no jugular venous distention. No thyroid enlargement, no tenderness.  LUNGS: Normal breath sounds bilaterally, no wheezing, rales, rhonchi. No use of accessory muscles of respiration.  CARDIOVASCULAR: S1, S2 normal. No murmurs, rubs, or gallops.  ABDOMEN: Soft, nontender, nondistended. Bowel sounds present. No organomegaly or mass.  EXTREMITIES: No pedal edema, cyanosis, or clubbing. + 2 pedal & radial pulses b/l.   NEUROLOGIC: Cranial nerves II through XII are intact. Right facial droop. Left upper and lower extremity motor strength 5 over 5. Right upper and  lower extremity strength 4/5. Sensations intact all over. Gait not tested. Some slurred speech PSYCHIATRIC: The patient is alert and oriented x 3. Good affect.  SKIN: No obvious rash, lesion, or ulcer.   LABORATORY PANEL:   CBC  Recent Labs Lab 12/09/16 1935  WBC 8.0  HGB 13.4  HCT 40.7  PLT 272   ------------------------------------------------------------------------------------------------------------------  Chemistries   Recent Labs Lab 12/09/16 1935  NA 138  K 4.4  CL 102  CO2 27  GLUCOSE 324*  BUN 30*  CREATININE 0.86  CALCIUM 9.6  AST 22  ALT 20  ALKPHOS 44  BILITOT 0.5   ------------------------------------------------------------------------------------------------------------------  Cardiac Enzymes  Recent Labs Lab 12/09/16 1935  TROPONINI 0.77*   ------------------------------------------------------------------------------------------------------------------  RADIOLOGY:  Dg Chest 2 View  Result Date: 12/09/2016 CLINICAL DATA:  Weakness.  Slurred speech.  Elevated troponins. EXAM: CHEST  2 VIEW COMPARISON:  01/26/2015 FINDINGS: Midline trachea. Borderline cardiomegaly. Atherosclerosis in the transverse aorta. No pleural effusion or pneumothorax. Mild biapical pleural thickening. Mild interstitial thickening. No lobar consolidation. IMPRESSION: No acute cardiopulmonary disease. Peribronchial thickening which may relate to chronic bronchitis or smoking. Aortic Atherosclerosis (ICD10-I70.0). Electronically Signed   By: Jeronimo Greaves M.D.   On: 12/09/2016 20:43   Ct Head Wo Contrast  Result Date: 12/09/2016 CLINICAL DATA:  Weakness, slurred speech, dizziness and difficulty walking since last night. History of TIA. EXAM: CT HEAD WITHOUT CONTRAST TECHNIQUE: Contiguous axial images were obtained from the base of the skull through the vertex without intravenous contrast. COMPARISON:  Brain MRI dated 10/30/2009 and head CT dated 08/08/2008. FINDINGS: Brain: Mild  generalized age related parenchymal atrophy with commensurate dilatation of the ventricles and sulci. Mild chronic small vessel ischemic changes in the periventricular white matter. There is no mass, hemorrhage, edema or other evidence of acute parenchymal abnormality. No extra-axial hemorrhage. Vascular: There are chronic calcified atherosclerotic changes of the large vessels at the skull base. No unexpected hyperdense vessel. Skull: Normal. Negative for fracture or focal lesion. Sinuses/Orbits: No acute finding. Other: None. IMPRESSION: No acute findings.  No intracranial mass, hemorrhage or edema. Mild chronic small vessel ischemic change within the periventricular white matter. Electronically Signed   By: Bary Richard M.D.   On: 12/09/2016 19:38     IMPRESSION AND PLAN:   * Acute CVA . Greater than 30 hour onset. -Check MRI of the brain, Carotid dopplers, Echo - Start aspirin and statin. - Lovenox for DVT prophylaxis. - PT/OT/Speech consult as needed per symptoms - Neuro checks every 4 hours for 24 hours. - Consult neurology.  * Elevated troponin - 0.77 Etiology unclear. No chest pain. She has had  on and off shortness of breath for 2 weeks. We'll repeat troponin. Telemetry monitoring. Could also be due to acute CVA. Consult cardiology. Start aspirin. No anticoagulation due to high risk for hemorrhagic transformation of stroke. No active chest pain and no EKG changes. Check echocardiogram  * Hypertension. Permissive hypertension.  * Paroxysmal atrial fibrillation with chadsvasc score of 8 Patient hesitant to be on adequate regulation due to history of lower GI bleed. She wants to discuss further with her cardiologist. Consulted. Continue aspirin  * IDDM Home meds and SSI  * DVT prophylaxis with Lovenox  All the records are reviewed and case discussed with ED provider. Management plans discussed with the patient, family and they are in agreement.  CODE STATUS: DO NOT RESUSCITATE.  Discussed with patient with 2 daughters at bedside.  TOTAL TIME TAKING CARE OF THIS PATIENT: 40 minutes.   Milagros Loll R M.D on 12/09/2016 at 9:31 PM  Between 7am to 6pm - Pager - [phone]  After 6pm go to [email or web address] - password [email or web address] [facility]  [facility]  Office  [phone]  CC: Primary care physician; Margaretann Loveless, PA-C  Note: This dictation was prepared with Dragon dictation along with smaller phrase technology. Any transcriptional errors that result from this process are unintentional.

## 2016-12-09 NOTE — ED Triage Notes (Signed)
Patient to ER for c/o weakness, slurred speech, dizziness, and difficulty walking since last night. Patient states she has h/o TIA, but no h/o CVA. Patient states she had pain and weakness to right arm. States she still feels dizzy. No current slurred speech, but patient is slower to respond than normal.

## 2016-12-09 NOTE — ED Notes (Addendum)
Per family her speech and gait is still off. Per patient she feels like its hard for her to breath. Patient did get winded while doing the verbal part of the NIH scale. Patient is unsteady on her feet.

## 2016-12-09 NOTE — ED Notes (Signed)
Reported to Annice Pih, RN that patient is currently in MRI and as soon as she returns to the ED I will call and let them know we are on the way.

## 2016-12-09 NOTE — ED Notes (Signed)
Patient ambulated to bathroom.

## 2016-12-09 NOTE — ED Provider Notes (Signed)
[facility] Emergency Department Provider Note  ____________________________________________   I have reviewed the triage vital signs and the nursing notes.   HISTORY  Chief Complaint Altered Mental Status    HPI Peggy [name] is a 83 y.o. female with a history of TIAs, MI, chronic recurrent shortness of breath, who presents today complaining of 2 different things. The first is that she's been short of breath for last 2 weeks worse with exertion denies any chest pain, she denies any leg swelling history of DVT or PE. She is simply more short of breath than normal when she walks around. The second concern the patient has is that she has had since last night difficulty speaking. She feels that she is back to normal now, however she was able to talk and she could not walk because of right-sided weakness yesterday. She also complained that her entire right side of her body hurt. No specific chest pain, but her arm and leg were uncomfortable. She has history of neuropathy and sometimes is hard for her to tell the difference between that and other pathologies however. This did seem like a different pain from her normal pain. She states that this time she has no pain, she has not had knee pain since last night, and she states that she feels like she is back to normal in terms of her speaking denies any ongoing neurologic symptoms aside from feeling slightly lightheaded.      Past Medical History:  Diagnosis Date  . Allergy   . Arthritis   . Diabetes mellitus without complication (HCC)   . GERD (gastroesophageal reflux disease)   . Hyperlipidemia   . Hypertension   . TIA (transient ischemic attack)     Patient Active Problem List   Diagnosis Date Noted  . Myocardial infarction (HCC) 07/30/2015  . Urinary frequency 02/09/2015  . RUQ abdominal pain 01/26/2015  . Shortness of breath 01/26/2015  . Anxiety 01/26/2015  . Chronic pruritus 12/21/2014  . Cerebral vascular  accident (HCC) 11/16/2014  . Chronic low back pain 11/16/2014  . Adult hypothyroidism 11/15/2014  . Allergic rhinitis 11/15/2014  . Body mass index (BMI) of 28.0-28.9 in adult 11/15/2014  . Arthritis 11/15/2014  . Cramps of lower extremity 11/15/2014  . Essential (primary) hypertension 11/15/2014  . Acid reflux 11/15/2014  . Hypercholesteremia 11/15/2014  . Cannot sleep 11/15/2014  . Psoriasis 11/15/2014  . Itch of skin 11/15/2014  . Restless leg 11/15/2014  . Diabetes mellitus, type 2 (HCC) 11/15/2014  . Breath shortness 11/15/2014  . Arteriosclerosis of coronary artery 02/20/2014  . AF (paroxysmal atrial fibrillation) (HCC) 02/20/2014  . Temporary cerebral vascular dysfunction 02/20/2014  . DD (diverticular disease) 10/05/2013    Past Surgical History:  Procedure Laterality Date  . ABDOMINAL HYSTERECTOMY  1970  . BACK SURGERY  2013  . colectomy Right 10/08/2013   Dr. Egbert Garibaldi  . CORONARY ANGIOPLASTY WITH STENT PLACEMENT      Prior to Admission medications   Medication Sig Start Date End Date Taking? Authorizing Provider  acetaminophen (TYLENOL) 650 MG CR tablet Take 650 mg by mouth every 8 (eight) hours as needed for pain.   Yes [provider]  aspirin 81 MG tablet Take 81 mg by mouth daily.    Yes [provider]  fenofibrate (TRICOR) 145 MG tablet Take 1 tablet (145 mg total) by mouth daily. 09/10/15  Yes Lorie Phenix, MD  fluocinonide ointment (LIDEX) 0.05 % Apply 1 application topically daily as needed. 11/17/16  Yes [provider]  fluticasone (FLONASE) 50 MCG/ACT nasal spray USE ONE SPRAY IN EACH NOSTRIL DAILY 09/27/15  Yes Lorie Phenix, MD  HYDROcodone-acetaminophen (NORCO/VICODIN) 5-325 MG tablet Take 1 tablet by mouth 2 (two) times daily. As needed for chronic low back pain 07/18/15  Yes Lorie Phenix, MD  hydrOXYzine (ATARAX/VISTARIL) 25 MG tablet Take 1 tablet (25 mg total) by mouth 2 (two) times daily as needed. 11/16/14  Yes Lorie Phenix, MD  insulin NPH-regular Human (NOVOLIN 70/30) (70-30) 100 UNIT/ML injection Inject 32 Units into the skin at bedtime.   Yes [provider]  levothyroxine (SYNTHROID, LEVOTHROID) 50 MCG tablet Take 1 tablet (50 mcg total) by mouth daily. PATIENT NEEDS TO SCHEDULE OFFICE VISIT FOR FOLLOW UP 02/01/16  Yes Malva Limes, MD  Melatonin 1 MG TABS Take 1 tablet by mouth at bedtime as needed. Enhanced Sleep with melatonin. Active ingredient [place] 200mg  and 1.5 of melatonin   Yes [provider]  metoprolol succinate (TOPROL-XL) 50 MG 24 hr tablet TAKE 1 TABLET BY MOUTH EVERY DAY Patient taking differently: TAKE 0.5 TABLET BY MOUTH EVERY DAY 09/06/15  Yes Lorie Phenix, MD  Multiple Vitamin (MULTIVITAMIN) tablet Take 1 tablet by mouth daily.   Yes [provider]  Potassium 99 MG TABS Take 1 tablet by mouth daily.   Yes [provider]  doxepin (SINEQUAN) 10 MG capsule TAKE 1 CAPSULE BY MOUTH EVERY NIGHT AT BEDTIME Patient not taking: Reported on 12/09/2016 12/31/15   Margaretann Loveless, PA-C  gabapentin (NEURONTIN) 300 MG capsule TAKE 1 TO 2 CAPSULES BY MOUTH EVERY NIGHT AT BEDTIME AS DIRECTED Patient not taking: Reported on 12/09/2016 09/19/15   Lorie Phenix, MD  Insulin Glargine (LANTUS SOLOSTAR) 100 UNIT/ML Solostar Pen Administer 20 units under skin every evening.  Increase by 2 units every 3 days until fasting sugars are in the 150's range. Patient not taking: Reported on 12/09/2016 04/15/16   Margaretann Loveless, PA-C  Insulin Syringe-Needle U-100 29G X 1/2" 0.5 ML MISC Use as directed with insulin. 09/03/16   Margaretann Loveless, PA-C  LORazepam (ATIVAN) 0.5 MG tablet Take 1 tablet (0.5 mg total) by mouth 2 (two) times daily as needed for anxiety. Patient not taking: Reported on 12/09/2016 01/26/15   Lorie Phenix, MD  montelukast (SINGULAIR) 10 MG tablet Take 1 tablet (10 mg total) by mouth at bedtime. Patient not taking: Reported on 12/09/2016 05/17/15    Lorie Phenix, MD  oxybutynin (DITROPAN) 5 MG tablet Take 5 mg by mouth every evening.    [provider]  triamcinolone cream (KENALOG) 0.1 % APPLY AFFECTED RASH TWICE DAILY UNTIL CLEAR. Patient not taking: Reported on 12/09/2016 07/29/16   Margaretann Loveless, PA-C    Allergies Diphenhydramine; Metformin; Oxybutynin; Amlodipine; Ciprofloxacin; Lisinopril; Penicillins; and Saxagliptin  Family History  Problem Relation Age of Onset  . Breast cancer Mother   . Pancreatitis Father   . Breast cancer Sister   . Lung cancer Brother   . Melanoma Brother   . Throat cancer Brother     Social History Social History  Substance Use Topics  . Smoking status: Former Smoker    Packs/day: 1.00    Years: 30.00    Quit date: 11/30/1999  . Smokeless tobacco: Never Used  . Alcohol use No    Review of Systems Constitutional: No fever/chills Eyes: No visual changes. ENT: No sore throat. No stiff neck no neck pain Cardiovascular: Denies chest pain. Respiratory: Positive shortness of breath. Gastrointestinal:  no vomiting.  No diarrhea.  No constipation. Genitourinary: Negative for dysuria. Musculoskeletal: Negative lower extremity swelling Skin: Negative for rash. Neurological: See history of present illness ____________________________________________   PHYSICAL EXAM:  VITAL SIGNS: ED Triage Vitals [12/09/16 1914]  Enc Vitals Group     BP (!) 189/54     Pulse Rate 81     Resp 20     Temp (!) 97.5 F (36.4 C)     Temp Source Oral     SpO2 97 %     Weight 156 lb (70.8 kg)     Height 5\' 3"  (1.6 m)     Head Circumference      Peak Flow      Pain Score      Pain Loc      Pain Edu?      Excl. in GC?     Constitutional: Alert and oriented. Well appearing and in no acute distress. Eyes: Conjunctivae are normal Head: Atraumatic HEENT: No congestion/rhinnorhea. Mucous membranes are moist.  Oropharynx non-erythematous Neck:   Nontender with no meningismus, no masses, no  stridor Cardiovascular: Normal rate, regular rhythm. Grossly normal heart sounds.  Good peripheral circulation. Respiratory: Normal respiratory effort.  No retractions. Lungs CTAB. Abdominal: Soft and nontender. No distention. No guarding no rebound Back:  There is no focal tenderness or step off.  there is no midline tenderness there are no lesions noted. there is no CVA tenderness Musculoskeletal: No lower extremity tenderness, no upper extremity tenderness. No joint effusions, no DVT signs strong distal pulses no edema Neurologic: Cranial nerves II through XII are grossly intact 5 out of 5 strength bilateral upper and lower extremity. Finger to nose within normal limits heel to shin within normal limits, speech is normal with no word finding difficulty or dysarthria, reflexes symmetric, pupils are equally round and reactive to light, there is no pronator drift, sensation is normal, vision is intact to confrontation, gait is deferred, there is no nystagmus, normal neurologic exam Skin:  Skin is warm, dry and intact. No rash noted. Psychiatric: Mood and affect are normal. Speech and behavior are normal.  ____________________________________________   LABS (all labs ordered are listed, but only abnormal results are displayed)  Labs Reviewed  COMPREHENSIVE METABOLIC PANEL - Abnormal; Notable for the following:       Result Value   Glucose, Bld 324 (*)    BUN 30 (*)    GFR calc non Af Amer 60 (*)    All other components within normal limits  TROPONIN I - Abnormal; Notable for the following:    Troponin I 0.77 (*)    All other components within normal limits  CBC  DIFFERENTIAL  PROTIME-INR  APTT  CBG MONITORING, ED   ____________________________________________  EKG  I personally interpreted any EKGs ordered by me or triage Sinus rhythm rate 70 bpm no acute ST elevation or depression, baseline artifact somewhat limits interpretation, possible old anterior infarct noted, normal  axis. ____________________________________________  RADIOLOGY  I reviewed any imaging ordered by me or triage that were performed during my shift and, if possible, patient and/or family made aware of any abnormal findings. ____________________________________________   PROCEDURES  Procedure(s) performed: None  Procedures  Critical Care performed: CRITICAL CARE Performed by: Jeanmarie Plant   Total critical care time: 42 minutes  Critical care time was exclusive of separately billable procedures and treating other patients.  Critical care was necessary to treat or prevent imminent or life-threatening deterioration.  Critical care  was time spent personally by me on the following activities: development of treatment plan with patient and/or surrogate as well as nursing, discussions with consultants, evaluation of patient's response to treatment, examination of patient, obtaining history from patient or surrogate, ordering and performing treatments and interventions, ordering and review of laboratory studies, ordering and review of radiographic studies, pulse oximetry and re-evaluation of patient's condition.   ____________________________________________   INITIAL IMPRESSION / ASSESSMENT AND PLAN / ED COURSE  Pertinent labs & imaging results that were available during my care of the patient were reviewed by me and considered in my medical decision making (see chart for details).  Patient here with 2 different complaint, the first is that she has been progressively short of breath over the last 2 weeks with exertional symptoms. She does have a positive troponin. Does not seem to have a chest pain or ongoing active ischemia. We will give her aspirin and get a chest x-ray she would need to be admitted for further evaluation of this.  Second issue was that the patient seemed to have TIA symptoms last night with inability to speak and weakness on the right side. That also seems to have  resolved. She was complaining also of right-sided upper and lower extremity pain, which does not appear to be reproducible at this time. I don't believe dissection is likely, however we will get a chest x-ray. Patient's symptoms have all resolved with time. She is not daily for TPA but lower car further evaluation as well as an inpatient for this.    ____________________________________________   FINAL CLINICAL IMPRESSION(S) / ED DIAGNOSES  Final diagnoses:  None      This chart was dictated using voice recognition software.  Despite best efforts to proofread,  errors can occur which can change meaning.      Jeanmarie Plant, MD 12/09/16 2028

## 2016-12-09 NOTE — ED Notes (Signed)
Spoke with pharmacy we are out if IV ativan. Patient will try to do MRI without it. Admitting dr stated if she couldn't we will change to PO and give it time to work.

## 2016-12-09 NOTE — ED Notes (Signed)
Patient transported to MRI 

## 2016-12-09 NOTE — ED Notes (Signed)
Patient states that the BP cuff is hurting her arm when it pumps up.

## 2016-12-09 NOTE — Progress Notes (Signed)
PHARMACIST - PHYSICIAN ORDER COMMUNICATION  CONCERNING: P&T Medication Policy on Herbal Medications  DESCRIPTION:  This patient's order for: OTC Potassium tabs 99 mg  has been noted.  This product(s) is classified as an "herbal" or natural product. Due to a lack of definitive safety studies or FDA approval, nonstandard manufacturing practices, plus the potential risk of unknown drug-drug interactions while on inpatient medications, the Pharmacy and Therapeutics Committee does not permit the use of "herbal" or natural products of this type within [facility].   ACTION TAKEN: The pharmacy department is unable to verify this order at this time and your patient has been informed of this safety policy. Please reevaluate patient's clinical condition at discharge and address if the herbal or natural product(s) should be resumed at that time.

## 2016-12-10 ENCOUNTER — Inpatient Hospital Stay
Admit: 2016-12-10 | Discharge: 2016-12-10 | Disposition: A | Discharge status: Home / Self Care | Payer: Medicare Other | Attending: Internal Medicine | Admitting: Internal Medicine

## 2016-12-10 ENCOUNTER — Inpatient Hospital Stay: Payer: Medicare Other

## 2016-12-10 DIAGNOSIS — G459 Transient cerebral ischemic attack, unspecified: Secondary | ICD-10-CM | POA: Diagnosis present

## 2016-12-10 LAB — BASIC METABOLIC PANEL
Anion gap: 7 (ref 5–15)
BUN: 30 mg/dL — AB (ref 6–20)
CO2: 28 mmol/L (ref 22–32)
CREATININE: 0.98 mg/dL (ref 0.44–1.00)
Calcium: 9.4 mg/dL (ref 8.9–10.3)
Chloride: 108 mmol/L (ref 101–111)
GFR, EST AFRICAN AMERICAN: 59 mL/min — AB (ref >60)
GFR, EST NON AFRICAN AMERICAN: 51 mL/min — AB (ref >60)
Glucose, Bld: 156 mg/dL — ABNORMAL HIGH (ref 65–99)
POTASSIUM: 4.7 mmol/L (ref 3.5–5.1)
SODIUM: 143 mmol/L (ref 135–145)

## 2016-12-10 LAB — LIPID PANEL
Cholesterol: 175 mg/dL (ref 0–200)
HDL: 29 mg/dL — ABNORMAL LOW (ref >40)
LDL CALC: 69 mg/dL (ref 0–99)
Total CHOL/HDL Ratio: 6 RATIO
Triglycerides: 387 mg/dL — ABNORMAL HIGH (ref <150)
VLDL: 77 mg/dL — AB (ref 0–40)

## 2016-12-10 LAB — ECHOCARDIOGRAM COMPLETE
HEIGHTINCHES: 62 in
Weight: 2485 oz

## 2016-12-10 LAB — CBC
HCT: 38 % (ref 35.0–47.0)
Hemoglobin: 12.9 g/dL (ref 12.0–16.0)
MCH: 27.7 pg (ref 26.0–34.0)
MCHC: 33.9 g/dL (ref 32.0–36.0)
MCV: 81.8 fL (ref 80.0–100.0)
PLATELETS: 269 10*3/uL (ref 150–440)
RBC: 4.65 MIL/uL (ref 3.80–5.20)
RDW: 14.3 % (ref 11.5–14.5)
WBC: 9.8 10*3/uL (ref 3.6–11.0)

## 2016-12-10 LAB — TROPONIN I
TROPONIN I: 0.74 ng/mL — AB (ref <0.03)
Troponin I: 0.75 ng/mL (ref <0.03)

## 2016-12-10 LAB — CK: Total CK: 130 U/L (ref 38–234)

## 2016-12-10 LAB — GLUCOSE, CAPILLARY
GLUCOSE-CAPILLARY: 153 mg/dL — AB (ref 65–99)
Glucose-Capillary: 229 mg/dL — ABNORMAL HIGH (ref 65–99)

## 2016-12-10 MED ORDER — CLOPIDOGREL BISULFATE 75 MG PO TABS: 75.0000 mg | ORAL_TABLET | Freq: Every day | ORAL | Status: DC

## 2016-12-10 MED ORDER — ATORVASTATIN CALCIUM 40 MG PO TABS: 40.0000 mg | ORAL_TABLET | Freq: Every day | ORAL | 0 refills | Status: DC

## 2016-12-10 MED ORDER — ROPINIROLE HCL 0.25 MG PO TABS: 0.2500 mg | ORAL_TABLET | Freq: Every day | ORAL | 0 refills | Status: DC

## 2016-12-10 MED ORDER — INSULIN NPH ISOPHANE & REGULAR (70-30) 100 UNIT/ML ~~LOC~~ SUSP: 18.0000 [IU] | Freq: Two times a day (BID) | SUBCUTANEOUS | 11 refills | Status: DC

## 2016-12-10 MED ORDER — CLOPIDOGREL BISULFATE 75 MG PO TABS: 75.0000 mg | ORAL_TABLET | Freq: Every day | ORAL | 0 refills | Status: DC

## 2016-12-10 MED ORDER — METOPROLOL SUCCINATE ER 50 MG PO TB24: ORAL_TABLET | ORAL | 1 refills | Status: DC

## 2016-12-10 NOTE — Evaluation (Cosign Needed Addendum)
Physical Therapy Evaluation Patient Details Name: Peggy [name] MRN: [id number] DOB: [birth year] Today's Date: 12/10/2016   History of Present Illness  presented to ER secondary to R-sided weakness, slurred speech; admitted with acute TIA vs CVA.  All imaging to date (CT, MRI, MRA) negative for acute infarct or large vessel occlusion.  Clinical Impression  Upon evaluation, patient alert and oriented; follows all commands and demonstrates good insight, good effort/participation with all functional activities.  Bilat UE/LE strength and ROM grossly symmetrical and WFL; no focal weakness or sensory deficit appreciated.  Mild functional weakness in R posterior/lateral hip (resulting in mildly antalgic gait), and mild higher-level balance deficits (R LE) but patient/family reports this to be baseline for patient.  Patient demonstrates good awareness of safety needs, independently initiating use of compensatory strategies as needed throughout session.  All functional mobility (bed mobility, sit/stand, basic transfers and gait (300') completed without assist device, mod indep, without significant LOB or safety concern.  Completes 10' walk distance in 6 seconds and scores 47/56 on BERG-both indicative of minimal fall risk with functional activities. PAtient appears to be at baseline level of functional ability, verified by patient and two daughters present in room.  No acute PT needs identified; will complete initial order at this time.  Please re-consult should needs change.    Follow Up Recommendations No PT follow up    Equipment Recommendations       Recommendations for Other Services       Precautions / Restrictions Precautions Precautions: Fall Restrictions Weight Bearing Restrictions: No      Mobility  Bed Mobility Overal bed mobility: Modified Independent                Transfers Overall transfer level: Modified independent Equipment used: Rolling walker (2 wheeled)              General transfer comment: sit/stand from various surface heights without difficulty, safety concern; good LE strength, power and control  Ambulation/Gait Ambulation/Gait assistance: Modified independent (Device/Increase time);Supervision Ambulation Distance (Feet): 100 Feet Assistive device: Rolling walker (2 wheeled)   Gait velocity: 10' walk time, 7 seconds   General Gait Details: reciprocal stepping pattern with fair cadence, gait speed  Stairs            Wheelchair Mobility    Modified Rankin (Stroke Patients Only)       Balance Overall balance assessment: Needs assistance Sitting-balance support: No upper extremity supported;Feet supported Sitting balance-Leahy Scale: Normal     Standing balance support: No upper extremity supported Standing balance-Leahy Scale: Good   Single Leg Stance - Right Leg: 5 Single Leg Stance - Left Leg: 2             Standardized Balance Assessment Standardized Balance Assessment : Berg Balance Test Berg Balance Test Sit to Stand: Able to stand without using hands and stabilize independently Standing Unsupported: Able to stand safely 2 minutes Sitting with Back Unsupported but Feet Supported on Floor or Stool: Able to sit safely and securely 2 minutes Stand to Sit: Sits safely with minimal use of hands Transfers: Able to transfer safely, minor use of hands Standing Unsupported with Eyes Closed: Able to stand 10 seconds safely Standing Ubsupported with Feet Together: Able to place feet together independently and stand 1 minute safely From Standing, Reach Forward with Outstretched Arm: Can reach confidently >25 cm (10") From Standing Position, Pick up Object from Floor: Able to pick up shoe safely and easily From Standing Position, Turn to  Look Behind Over each Shoulder: Looks behind one side only/other side shows less weight shift Turn 360 Degrees: Able to turn 360 degrees safely in 4 seconds or less Standing Unsupported,  Alternately Place Feet on Step/Stool: Able to complete >2 steps/needs minimal assist Standing Unsupported, One Foot in Front: Able to take small step independently and hold 30 seconds Standing on One Leg: Tries to lift leg/unable to hold 3 seconds but remains standing independently Total Score: 47         Pertinent Vitals/Pain Pain Assessment: No/denies pain    Home Living Family/patient expects to be discharged to:: Private residence Living Arrangements: Children Available Help at Discharge: Family (daughter-works outside of home) Type of Home: House Home Access: Stairs to enter Entrance Stairs-Rails: Left Entrance Stairs-Number of Steps: 4 Home Layout: One level        Prior Function Level of Independence: Independent         Comments: Indep with ADLs, household and community mobility; + driving; denies recent fall history.     Hand Dominance   Dominant Hand: Right    Extremity/Trunk Assessment   Upper Extremity Assessment Upper Extremity Assessment: Overall WFL for tasks assessed (strength and ROM grossly symmetrical, no focal weakness noted; sensation fully intact and symmetrical)    Lower Extremity Assessment Lower Extremity Assessment: Overall WFL for tasks assessed (strength and ROM grossly smmetrical, no focal weakness noted; sensation intact and symmetrical; + babinski R LE)       Communication   Communication: No difficulties  Cognition Arousal/Alertness: Awake/alert Behavior During Therapy: WFL for tasks assessed/performed Overall Cognitive Status: Within Functional Limits for tasks assessed                                        General Comments      Exercises Other Exercises Other Exercises: Toilet transfer, ambulatory without assist device, mod indep; sit/stand from standard toilet height, hygiene and standing balance at sink, mod indep.  Good functional reach and good awareness of limits of stability Other Exercises:  Additional gait x300 without assist device, sup/mod indep-improved cadence and gait speed, improved trunk rotation and arm swing.  Completes 10' walk time 6 seconds (slightly improved from gait speed with RW).  Completes horizontal and vertical head turns without significant gait deviations and maintains appropriate cadence throughout   Assessment/Plan    PT Assessment Patent does not need any further PT services  PT Problem List         PT Treatment Interventions      PT Goals (Current goals can be found in the Care Plan section)  Acute Rehab PT Goals PT Goal Formulation: All assessment and education complete, DC therapy    Frequency     Barriers to discharge        Co-evaluation               AM-PAC PT "6 Clicks" Daily Activity  Outcome Measure Difficulty turning over in bed (including adjusting bedclothes, sheets and blankets)?: None Difficulty moving from lying on back to sitting on the side of the bed? : None Difficulty sitting down on and standing up from a chair with arms (e.g., wheelchair, bedside commode, etc,.)?: None Help needed moving to and from a bed to chair (including a wheelchair)?: None Help needed walking in hospital room?: None Help needed climbing 3-5 steps with a railing? : A Little 6 Click Score:  23    End of Session Equipment Utilized During Treatment: Gait belt Activity Tolerance: Patient tolerated treatment well Patient left: in bed;with bed alarm set;with family/visitor present Nurse Communication: Mobility status PT Visit Diagnosis: Difficulty in walking, not elsewhere classified (R26.2)    Time: 1352-1416 PT Time Calculation (min) (ACUTE ONLY): 24 min   Charges:   PT Evaluation $PT Eval Low Complexity: 1 Procedure PT Treatments $Gait Training: 8-22 mins   PT G Codes:   PT G-Codes **NOT FOR INPATIENT CLASS** Functional Assessment Tool Used: AM-PAC 6 Clicks Basic Mobility;Clinical judgement;Berg Functional Limitation: Mobility:  Walking and moving around Mobility: Walking and Moving Around Current Status (L8937): At least 1 percent but less than 20 percent impaired, limited or restricted Mobility: Walking and Moving Around Goal Status [phone]): At least 1 percent but less than 20 percent impaired, limited or restricted Mobility: Walking and Moving Around Discharge Status [phone]): At least 1 percent but less than 20 percent impaired, limited or restricted   Murat Rideout H. Manson Passey, PT, DPT, NCS 12/10/16, 10:01 PM [phone]

## 2016-12-10 NOTE — Consult Note (Signed)
Referring Physician: Renae Gloss    Chief Complaint: Right sided weakness, slurred speech  HPI: Peggy [name] is an 83 y.o. female with a history of afib previously on anticoagulation with complications who on 7/9 had acute onset of right sided weakness, difficulty with speech and dizziness.  Symptoms had significantly improved by the time of presentation and eventually resolved spontaneously.  Patient reports that she is now at baseline.     Date last known well: Date: 12/08/2016 Time last known well: Time: 20:00 tPA Given: No: Resolution of symptoms  Past Medical History:  Diagnosis Date  . A-fib (HCC)   . Allergy   . Arthritis   . Diabetes mellitus without complication (HCC)   . GERD (gastroesophageal reflux disease)   . Hyperlipidemia   . Hypertension   . TIA (transient ischemic attack)     Past Surgical History:  Procedure Laterality Date  . ABDOMINAL HYSTERECTOMY  1970  . BACK SURGERY  2013  . colectomy Right 10/08/2013   Dr. Egbert Garibaldi  . CORONARY ANGIOPLASTY WITH STENT PLACEMENT      Family History  Problem Relation Age of Onset  . Breast cancer Mother   . Pancreatitis Father   . Breast cancer Sister   . Lung cancer Brother   . Melanoma Brother   . Throat cancer Brother    Social History:  reports that she quit smoking about 17 years ago. She has a 30.00 pack-year smoking history. She has never used smokeless tobacco. She reports that she does not drink alcohol or use drugs.  Allergies:  Allergies  Allergen Reactions  . Diphenhydramine Rash    AGITATION/DELIRIUM  . Metformin Diarrhea  . Oxybutynin Other (See Comments)    dizzy  . Amlodipine Rash  . Ciprofloxacin Rash  . Lisinopril Rash  . Penicillins Rash  . Saxagliptin Rash    Medications:  I have reviewed the patient's current medications. Prior to Admission:  Prescriptions Prior to Admission  Medication Sig Dispense Refill Last Dose  . acetaminophen (TYLENOL) 650 MG CR tablet Take 650 mg by mouth every 8  (eight) hours as needed for pain.   prn at prn  . aspirin 81 MG tablet Take 81 mg by mouth daily.    12/09/2016 at am  . fenofibrate (TRICOR) 145 MG tablet Take 1 tablet (145 mg total) by mouth daily. 90 tablet 1 12/09/2016 at am  . fluocinonide ointment (LIDEX) 0.05 % Apply 1 application topically daily as needed.  1 prn at prn  . fluticasone (FLONASE) 50 MCG/ACT nasal spray USE ONE SPRAY IN EACH NOSTRIL DAILY 16 g 5 prn at prn  . HYDROcodone-acetaminophen (NORCO/VICODIN) 5-325 MG tablet Take 1 tablet by mouth 2 (two) times daily. As needed for chronic low back pain 60 tablet 0 prn at prn  . hydrOXYzine (ATARAX/VISTARIL) 25 MG tablet Take 1 tablet (25 mg total) by mouth 2 (two) times daily as needed. 60 tablet 5 prn at prn  . levothyroxine (SYNTHROID, LEVOTHROID) 50 MCG tablet Take 1 tablet (50 mcg total) by mouth daily. PATIENT NEEDS TO SCHEDULE OFFICE VISIT FOR FOLLOW UP 90 tablet 0 12/09/2016 at am  . Melatonin 1 MG TABS Take 1 tablet by mouth at bedtime as needed. Enhanced Sleep with melatonin. Active ingredient [place] 200mg  and 1.5 of melatonin   prn at prn  . Multiple Vitamin (MULTIVITAMIN) tablet Take 1 tablet by mouth daily.   12/09/2016 at am  . Potassium 99 MG TABS Take 1 tablet by mouth daily.  12/09/2016 at am  . [DISCONTINUED] insulin NPH-regular Human (NOVOLIN 70/30) (70-30) 100 UNIT/ML injection Inject 32 Units into the skin at bedtime.   12/09/2016 at pm  . [DISCONTINUED] metoprolol succinate (TOPROL-XL) 50 MG 24 hr tablet TAKE 1 TABLET BY MOUTH EVERY DAY (Patient taking differently: TAKE 0.5 TABLET BY MOUTH EVERY DAY) 90 tablet 1 12/09/2016 at am  . doxepin (SINEQUAN) 10 MG capsule TAKE 1 CAPSULE BY MOUTH EVERY NIGHT AT BEDTIME (Patient not taking: Reported on 12/09/2016) 30 capsule 5 Not Taking at Unknown time  . gabapentin (NEURONTIN) 300 MG capsule TAKE 1 TO 2 CAPSULES BY MOUTH EVERY NIGHT AT BEDTIME AS DIRECTED (Patient not taking: Reported on 12/09/2016) 180 capsule 1 Not Taking at  Unknown time  . Insulin Glargine (LANTUS SOLOSTAR) 100 UNIT/ML Solostar Pen Administer 20 units under skin every evening.  Increase by 2 units every 3 days until fasting sugars are in the 150's range. (Patient not taking: Reported on 12/09/2016) 15 mL 0 Not Taking at Unknown time  . Insulin Syringe-Needle U-100 29G X 1/2" 0.5 ML MISC Use as directed with insulin. 100 each 5   . LORazepam (ATIVAN) 0.5 MG tablet Take 1 tablet (0.5 mg total) by mouth 2 (two) times daily as needed for anxiety. (Patient not taking: Reported on 12/09/2016) 30 tablet 0 Not Taking at Unknown time  . montelukast (SINGULAIR) 10 MG tablet Take 1 tablet (10 mg total) by mouth at bedtime. (Patient not taking: Reported on 12/09/2016) 30 tablet 5 Not Taking at Unknown time  . oxybutynin (DITROPAN) 5 MG tablet Take 5 mg by mouth every evening.   Not Taking at Unknown time  . triamcinolone cream (KENALOG) 0.1 % APPLY AFFECTED RASH TWICE DAILY UNTIL CLEAR. (Patient not taking: Reported on 12/09/2016) 454 g 0 Not Taking at Unknown time   Scheduled: . aspirin EC  81 mg Oral Daily  . atorvastatin  40 mg Oral q1800  . clopidogrel  75 mg Oral Daily  . enoxaparin (LOVENOX) injection  40 mg Subcutaneous Q24H  . fenofibrate  54 mg Oral Daily  . HYDROcodone-acetaminophen  1 tablet Oral BID  . insulin aspart  0-15 Units Subcutaneous TID WC  . insulin aspart  0-5 Units Subcutaneous QHS  . insulin aspart protamine- aspart  25 Units Subcutaneous Q supper  . levothyroxine  50 mcg Oral QAC breakfast  . LORazepam  1 mg Intravenous Once  . metoprolol succinate  50 mg Oral Daily  . oxybutynin  5 mg Oral QPM    ROS: History obtained from the patient  General ROS: negative for - chills, fatigue, fever, night sweats, weight gain or weight loss Psychological ROS: negative for - behavioral disorder, hallucinations, memory difficulties, mood swings or suicidal ideation Ophthalmic ROS: negative for - blurry vision, double vision, eye pain or loss of  vision ENT ROS: HOH Allergy and Immunology ROS: negative for - hives or itchy/watery eyes Hematological and Lymphatic ROS: negative for - bleeding problems, bruising or swollen lymph nodes Endocrine ROS: negative for - galactorrhea, hair pattern changes, polydipsia/polyuria or temperature intolerance Respiratory ROS: negative for - cough, hemoptysis, shortness of breath or wheezing Cardiovascular ROS: negative for - chest pain, dyspnea on exertion, edema or irregular heartbeat Gastrointestinal ROS: negative for - abdominal pain, diarrhea, hematemesis, nausea/vomiting or stool incontinence Genito-Urinary ROS: negative for - dysuria, hematuria, incontinence or urinary frequency/urgency Musculoskeletal ROS: foot pain at night Neurological ROS: as noted in HPI Dermatological ROS: negative for rash and skin lesion changes  Physical Examination:  Blood pressure (!) 149/55, pulse 63, temperature 97.8 F (36.6 C), temperature source Oral, resp. rate 16, height 5\' 2"  (1.575 m), weight 70.4 kg (155 lb 5 oz), SpO2 96 %.  HEENT-  Normocephalic, no lesions, without obvious abnormality.  Normal external eye and conjunctiva.  Normal TM's bilaterally.  Normal auditory canals and external ears. Normal external nose, mucus membranes and septum.  Normal pharynx. Cardiovascular- S1, S2 normal, pulses palpable throughout   Lungs- chest clear, no wheezing, rales, normal symmetric air entry Abdomen- soft, non-tender; bowel sounds normal; no masses,  no organomegaly Extremities- no edema Lymph-no adenopathy palpable Musculoskeletal-no joint tenderness, deformity or swelling Skin-warm and dry, no hyperpigmentation, vitiligo, or suspicious lesions  Neurological Examination   Mental Status: Alert, oriented, thought content appropriate.  Speech fluent without evidence of aphasia.  Able to follow 3 step commands without difficulty. Cranial Nerves: II: Discs flat bilaterally; Visual fields grossly normal, pupils  equal, round, reactive to light and accommodation III,IV, VI: ptosis not present, extra-ocular motions intact bilaterally V,VII: smile symmetric, facial light touch sensation normal bilaterally VIII: hearing normal bilaterally IX,X: gag reflex present XI: bilateral shoulder shrug XII: midline tongue extension Motor: Right : Upper extremity   5/5    Left:     Upper extremity   5/5  Lower extremity   5/5     Lower extremity   5/5 Tone and bulk:normal tone throughout; no atrophy noted Sensory: Pinprick and light touch intact throughout, bilaterally Deep Tendon Reflexes: 2+ in the upper extremities and absent in the lower extremities Plantars: Right: mute   Left: mute Cerebellar: Normal finger-to-nose and normal heel-to-shin test Gait: normal gait and station    Laboratory Studies:  Basic Metabolic Panel:  Recent Labs Lab 12/09/16 1935 12/10/16 0100  NA 138 143  K 4.4 4.7  CL 102 108  CO2 27 28  GLUCOSE 324* 156*  BUN 30* 30*  CREATININE 0.86 0.98  CALCIUM 9.6 9.4    Liver Function Tests:  Recent Labs Lab 12/09/16 1935  AST 22  ALT 20  ALKPHOS 44  BILITOT 0.5  PROT 6.7  ALBUMIN 3.9   No results for input(s): LIPASE, AMYLASE in the last 168 hours. No results for input(s): AMMONIA in the last 168 hours.  CBC:  Recent Labs Lab 12/09/16 1935 12/10/16 0100  WBC 8.0 9.8  NEUTROABS 5.1  --   HGB 13.4 12.9  HCT 40.7 38.0  MCV 83.1 81.8  PLT 272 269    Cardiac Enzymes:  Recent Labs Lab 12/09/16 1935 12/10/16 0100 12/10/16 0648  CKTOTAL  --  130  --   TROPONINI 0.77* 0.75* 0.74*    BNP: Invalid input(s): POCBNP  CBG:  Recent Labs Lab 12/09/16 2309 12/10/16 0742 12/10/16 1155  GLUCAP 267* 153* 229*    Microbiology: Results for orders placed or performed in visit on 10/06/13  Culture, blood (single)     Status: None   Collection Time: 10/07/13  9:44 PM  Result Value Ref Range Status   Micro Text Report   Final       COMMENT                    NO GROWTH AEROBICALLY/ANAEROBICALLY IN 5 DAYS   ANTIBIOTIC  Culture, blood (single)     Status: None   Collection Time: 10/07/13  9:50 PM  Result Value Ref Range Status   Micro Text Report   Final       COMMENT                   NO GROWTH AEROBICALLY/ANAEROBICALLY IN 5 DAYS   ANTIBIOTIC                                                        Coagulation Studies:  Recent Labs  12/09/16 1935  LABPROT 14.6  INR 1.13    Urinalysis: No results for input(s): COLORURINE, LABSPEC, PHURINE, GLUCOSEU, HGBUR, BILIRUBINUR, KETONESUR, PROTEINUR, UROBILINOGEN, NITRITE, LEUKOCYTESUR in the last 168 hours.  Invalid input(s): APPERANCEUR  Lipid Panel:    Component Value Date/Time   CHOL 175 12/10/2016 0648   CHOL 272 (H) 09/04/2015 1247   CHOL 245 (H) 10/07/2013 0438   TRIG 387 (H) 12/10/2016 0648   TRIG 786 (H) 10/07/2013 0438   HDL 29 (L) 12/10/2016 0648   HDL 33 (L) 09/04/2015 1247   HDL 29 (L) 10/07/2013 0438   CHOLHDL 6.0 12/10/2016 0648   VLDL 77 (H) 12/10/2016 0648   VLDL SEE COMMENT 10/07/2013 0438   LDLCALC 69 12/10/2016 0648   LDLCALC Comment 09/04/2015 1247   LDLCALC SEE COMMENT 10/07/2013 0438    HgbA1C:  Lab Results  Component Value Date   HGBA1C 9.1 09/04/2015    Urine Drug Screen:  No results found for: LABOPIA, COCAINSCRNUR, LABBENZ, AMPHETMU, THCU, LABBARB  Alcohol Level: No results for input(s): ETH in the last 168 hours.  Other results: EKG: normal sinus rhythm at 78 bpm.  Imaging: Dg Chest 2 View  Result Date: 12/09/2016 CLINICAL DATA:  Weakness.  Slurred speech.  Elevated troponins. EXAM: CHEST  2 VIEW COMPARISON:  01/26/2015 FINDINGS: Midline trachea. Borderline cardiomegaly. Atherosclerosis in the transverse aorta. No pleural effusion or pneumothorax. Mild biapical pleural thickening. Mild interstitial thickening. No lobar consolidation. IMPRESSION: No acute cardiopulmonary disease.  Peribronchial thickening which may relate to chronic bronchitis or smoking. Aortic Atherosclerosis (ICD10-I70.0). Electronically Signed   By: Jeronimo Greaves M.D.   On: 12/09/2016 20:43   Ct Head Wo Contrast  Result Date: 12/09/2016 CLINICAL DATA:  Weakness, slurred speech, dizziness and difficulty walking since last night. History of TIA. EXAM: CT HEAD WITHOUT CONTRAST TECHNIQUE: Contiguous axial images were obtained from the base of the skull through the vertex without intravenous contrast. COMPARISON:  Brain MRI dated 10/30/2009 and head CT dated 08/08/2008. FINDINGS: Brain: Mild generalized age related parenchymal atrophy with commensurate dilatation of the ventricles and sulci. Mild chronic small vessel ischemic changes in the periventricular white matter. There is no mass, hemorrhage, edema or other evidence of acute parenchymal abnormality. No extra-axial hemorrhage. Vascular: There are chronic calcified atherosclerotic changes of the large vessels at the skull base. No unexpected hyperdense vessel. Skull: Normal. Negative for fracture or focal lesion. Sinuses/Orbits: No acute finding. Other: None. IMPRESSION: No acute findings.  No intracranial mass, hemorrhage or edema. Mild chronic small vessel ischemic change within the periventricular white matter. Electronically Signed   By: Bary Richard M.D.   On: 12/09/2016 19:38   Mr Maxine Glenn Head Wo Contrast  Result Date: 12/09/2016 CLINICAL DATA:  RIGHT-sided weakness and slurred speech beginning yesterday.  Difficulty ambulating. History of hypertension, diabetes, atrial fibrillation, lower GI bleed. EXAM: MRI HEAD WITHOUT CONTRAST MRA HEAD WITHOUT CONTRAST TECHNIQUE: Multiplanar, multiecho pulse sequences of the brain and surrounding structures were obtained without intravenous contrast. Angiographic images of the head were obtained using MRA technique without contrast. COMPARISON:  CT HEAD December 09, 2016 at 1924 hours and MRI of the head Oct 30, 2009 FINDINGS:  MRI HEAD FINDINGS BRAIN: No reduced diffusion to suggest acute ischemia. No susceptibility artifact to suggest hemorrhage. The ventricles and sulci are normal for patient's age. LEFT choroidal fissure cyst. Patchy supratentorial white matter FLAIR T2 hyperintensities similar to prior MRI compatible with mild chronic small vessel ischemic disease, less than expected for age. No suspicious parenchymal signal, mass or mass effect. No abnormal extra-axial fluid collections. VASCULAR: Normal major intracranial vascular flow voids present at skull base. SKULL AND UPPER CERVICAL SPINE: No abnormal sellar expansion. No suspicious calvarial bone marrow signal. Craniocervical junction maintained. SINUSES/ORBITS: Mild paranasal sinus mucosal thickening. Mastoid air cells are well aerated. The included ocular globes and orbital contents are non-suspicious. OTHER: Patient is edentulous. MRA HEAD FINDINGS- mildly motion degraded examination. ANTERIOR CIRCULATION: Normal flow related enhancement of the included cervical, petrous, cavernous and supraclinoid internal carotid arteries. Tortuous LEFT distal cervical internal carotid artery with mild focal narrowing, seen with flow artifact or atherosclerosis. Patent anterior communicating artery. Severe stenosis proximal LEFT A2 segment and, severe stenosis RIGHT mid A2 segment. Moderate stenosis distal LEFT M1 segment severe stenosis RIGHT M2 segment. No large vessel occlusion, or aneurysm. POSTERIOR CIRCULATION: Codominant vertebral artery's. Decreased flow related enhancement with normal caliber bilateral proximal V4 segments favoring artifact. , however there is high-grade stenosis versus occluded distal LEFT V4 segment. Tandem severe stenoses basilar artery. Robust LEFT posterior communicating artery. Focally occluded versus severely stenotic RIGHT P1 2 junction with tandem severe stenoses bilateral mid and distal posterior cerebral arteries. No large vessel occlusion, aneurysm.  ANATOMIC VARIANTS: None. Source images and MIP images were reviewed. IMPRESSION: MRI HEAD: Stable negative noncontrast MRI of the head for age. MRA HEAD: 1. No emergent large vessel occlusion on this mildly motion degraded examination. 2. 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 Given severity of findings, recommend CTA HEAD for further characterization. Electronically Signed   By: Awilda Metro M.D.   On: 12/09/2016 22:26   Mr Brain Wo Contrast  Result Date: 12/09/2016 CLINICAL DATA:  RIGHT-sided weakness and slurred speech beginning yesterday. Difficulty ambulating. History of hypertension, diabetes, atrial fibrillation, lower GI bleed. EXAM: MRI HEAD WITHOUT CONTRAST MRA HEAD WITHOUT CONTRAST TECHNIQUE: Multiplanar, multiecho pulse sequences of the brain and surrounding structures were obtained without intravenous contrast. Angiographic images of the head were obtained using MRA technique without contrast. COMPARISON:  CT HEAD December 09, 2016 at 1924 hours and MRI of the head Oct 30, 2009 FINDINGS: MRI HEAD FINDINGS BRAIN: No reduced diffusion to suggest acute ischemia. No susceptibility artifact to suggest hemorrhage. The ventricles and sulci are normal for patient's age. LEFT choroidal fissure cyst. Patchy supratentorial white matter FLAIR T2 hyperintensities similar to prior MRI compatible with mild chronic small vessel ischemic disease, less than expected for age. No suspicious parenchymal signal, mass or mass effect. No abnormal extra-axial fluid collections. VASCULAR: Normal major intracranial vascular flow voids present at skull base. SKULL AND UPPER CERVICAL SPINE: No abnormal sellar expansion. No suspicious calvarial bone marrow signal. Craniocervical junction maintained. SINUSES/ORBITS: Mild paranasal sinus mucosal thickening. Mastoid air  cells are well aerated. The included ocular globes and orbital  contents are non-suspicious. OTHER: Patient is edentulous. MRA HEAD FINDINGS- mildly motion degraded examination. ANTERIOR CIRCULATION: Normal flow related enhancement of the included cervical, petrous, cavernous and supraclinoid internal carotid arteries. Tortuous LEFT distal cervical internal carotid artery with mild focal narrowing, seen with flow artifact or atherosclerosis. Patent anterior communicating artery. Severe stenosis proximal LEFT A2 segment and, severe stenosis RIGHT mid A2 segment. Moderate stenosis distal LEFT M1 segment severe stenosis RIGHT M2 segment. No large vessel occlusion, or aneurysm. POSTERIOR CIRCULATION: Codominant vertebral artery's. Decreased flow related enhancement with normal caliber bilateral proximal V4 segments favoring artifact. , however there is high-grade stenosis versus occluded distal LEFT V4 segment. Tandem severe stenoses basilar artery. Robust LEFT posterior communicating artery. Focally occluded versus severely stenotic RIGHT P1 2 junction with tandem severe stenoses bilateral mid and distal posterior cerebral arteries. No large vessel occlusion, aneurysm. ANATOMIC VARIANTS: None. Source images and MIP images were reviewed. IMPRESSION: MRI HEAD: Stable negative noncontrast MRI of the head for age. MRA HEAD: 1. No emergent large vessel occlusion on this mildly motion degraded examination. 2. 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 Given severity of findings, recommend CTA HEAD for further characterization. Electronically Signed   By: Awilda Metro M.D.   On: 12/09/2016 22:26   US Carotid Bilateral  Result Date: 12/10/2016 CLINICAL DATA:  83 year old female with a history of TIA. Cardiovascular risk factors include known prior stroke/ TIA, known coronary disease, diabetes EXAM: BILATERAL CAROTID DUPLEX ULTRASOUND TECHNIQUE: Wallace Cullens scale imaging, color Doppler and  duplex ultrasound were performed of bilateral carotid and vertebral arteries in the neck. COMPARISON:  None FINDINGS: Criteria: Quantification of carotid stenosis is based on velocity parameters that correlate the residual internal carotid diameter with NASCET-based stenosis levels, using the diameter of the distal internal carotid lumen as the denominator for stenosis measurement. The following velocity measurements were obtained: RIGHT ICA:  Systolic 67 cm/sec, Diastolic 15 cm/sec CCA:  77 cm/sec SYSTOLIC ICA/CCA RATIO:  0.9 ECA:  129 cm/sec LEFT ICA:  Systolic 78 cm/sec, Diastolic 22 cm/sec CCA:  61 cm/sec SYSTOLIC ICA/CCA RATIO:  1.3 ECA:  120 cm/sec Right Brachial SBP: Not acquired Left Brachial SBP: Not acquired RIGHT CAROTID ARTERY: No significant calcified disease of the right common carotid artery. Intermediate waveform maintained. Heterogeneous plaque without significant calcifications at the right carotid bifurcation. Low resistance waveform of the right ICA. No significant tortuosity. RIGHT VERTEBRAL ARTERY: Antegrade flow with low resistance waveform. LEFT CAROTID ARTERY: No significant calcified disease of the left common carotid artery. Intermediate waveform maintained. Heterogeneous plaque at the left carotid bifurcation without significant calcifications. Low resistance waveform of the left ICA. LEFT VERTEBRAL ARTERY:  Antegrade flow with low resistance waveform. IMPRESSION: Color duplex indicates minimal heterogeneous plaque, with no hemodynamically significant stenosis by duplex criteria in the extracranial cerebrovascular circulation. Signed, Yvone Neu. Loreta Ave, DO [facility] Electronically Signed   By: Gilmer Mor D.O.   On: 12/10/2016 12:12    Assessment: 83 y.o. female with a history of atrial fibrillation presenting with acute onset right sided weakness, slurred speech and dizziness.  Symptoms have resolved.  MRI of the brain  reviewed and shows no acute changes.  MRA shows significant cerebrovascular disease.  Suspect TIA as etiology of symptoms.  Patient with complications from anticoagulation in the past.  Carotid dopplers show no evidence of hemodynamically significant stenosis.  Echocardiogram shows no cardiac source of emboli with an EF of 55-60%.  A1c ending, LDL 69.  On ASA at home.    Stroke Risk Factors - atrial fibrillation, diabetes mellitus, hyperlipidemia and hypertension  Plan: 1. Prophylactic therapy-Continue ASA 81mg  and start Plavix 75mg  daily 2. Blood sugar control 3. Follow up with neurology on an outpatient basis   Case discussed with Dr. Yetta Barre, MD Neurology [phone] 12/10/2016, 4:34 PM

## 2016-12-10 NOTE — Care Management CC44 (Signed)
Condition Code 44 Documentation Completed  Patient Details  Name: JAEMI YEARIAN MRN: [id number] Date of Birth: [birth year]   Condition Code 44 given:  Yes Patient signature on Condition Code 44 notice:  Yes Documentation of 2 MD's agreement:    Code 44 added to claim:       Gwenette Greet, RN 12/10/2016, 3:33 PM

## 2016-12-10 NOTE — Consult Note (Signed)
[facility] Cardiology Consultation Note  Patient ID: Peggy [name], MRN: [id number], DOB/AGE: Jan 04, 1929 83 y.o. Admit date: 12/09/2016   Date of Consult: 12/10/2016 Primary Physician: Margaretann Loveless, PA-C Primary Cardiologist: Paraschos  Chief Complaint:  Chief Complaint  Patient presents with  . Altered Mental Status   Reason for Consult: stroke  HPI: 83 y.o. female with known paroxysmal nonvalvular atrial fibrillation peripheral vascular disease essential hypertension mixed hyperlipidemia and diabetes with complication having significant slurred speech and other difficulty of the dizziness and unsteadiness over a 24-48 hour period. The patient has had some resolution of these issues and currently is feeling much better. The patient has had an MRI suggesting significant stenoses of the above cerebrovascular chair. The patient does have known coronary artery disease status post previous stent placement for which she has not had any evidence of chest discomfort or new EKG changes. The patient additionally has had paroxysmal nonvalvular atrial fibrillation for which she has been on metoprolol. There is not been any current evidence of anticoagulation other than aspirin. The patient does have high intensity cholesterol therapy with atorvastatin due to coronary disease as well. Currently it appears that her primary TIA or vascular event is related to above and not due to atrial fibrillation with an EKG today and telemetry showing normal sinus rhythm. The patient does have an elevated troponin of 0.77 of unknown significance at this time and will be monitored very closely with ambulation  Past Medical History:  Diagnosis Date  . A-fib (HCC)   . Allergy   . Arthritis   . Diabetes mellitus without complication (HCC)   . GERD (gastroesophageal reflux disease)   . Hyperlipidemia   . Hypertension   . TIA (transient ischemic attack)       Surgical History:  Past Surgical History:   Procedure Laterality Date  . ABDOMINAL HYSTERECTOMY  1970  . BACK SURGERY  2013  . colectomy Right 10/08/2013   Dr. Egbert Garibaldi  . CORONARY ANGIOPLASTY WITH STENT PLACEMENT       Home Meds: Prior to Admission medications   Medication Sig Start Date End Date Taking? Authorizing Provider  acetaminophen (TYLENOL) 650 MG CR tablet Take 650 mg by mouth every 8 (eight) hours as needed for pain.   Yes [provider]  aspirin 81 MG tablet Take 81 mg by mouth daily.    Yes [provider]  fenofibrate (TRICOR) 145 MG tablet Take 1 tablet (145 mg total) by mouth daily. 09/10/15  Yes Lorie Phenix, MD  fluocinonide ointment (LIDEX) 0.05 % Apply 1 application topically daily as needed. 11/17/16  Yes [provider]  fluticasone [name]) 50 MCG/ACT nasal spray USE ONE SPRAY IN EACH NOSTRIL DAILY 09/27/15  Yes Lorie Phenix, MD  HYDROcodone-acetaminophen (NORCO/VICODIN) 5-325 MG tablet Take 1 tablet by mouth 2 (two) times daily. As needed for chronic low back pain 07/18/15  Yes Lorie Phenix, MD  hydrOXYzine (ATARAX/VISTARIL) 25 MG tablet Take 1 tablet (25 mg total) by mouth 2 (two) times daily as needed. 11/16/14  Yes Lorie Phenix, MD  insulin NPH-regular Human (NOVOLIN 70/30) (70-30) 100 UNIT/ML injection Inject 32 Units into the skin at bedtime.   Yes [provider]  levothyroxine (SYNTHROID, LEVOTHROID) 50 MCG tablet Take 1 tablet (50 mcg total) by mouth daily. PATIENT NEEDS TO SCHEDULE OFFICE VISIT FOR FOLLOW UP 02/01/16  Yes Malva Limes, MD  Melatonin 1 MG TABS Take 1 tablet by mouth at bedtime as needed. Enhanced Sleep with melatonin. Active  ingredient Send[place] 200mg  and 1.5 of melatonin   Yes [provider]  metoprolol succinate (TOPROL-XL) 50 MG 24 hr tablet TAKE 1 TABLET BY MOUTH EVERY DAY Patient taking differently: TAKE 0.5 TABLET BY MOUTH EVERY DAY 09/06/15  Yes Lorie Phenix, MD  Multiple Vitamin (MULTIVITAMIN) tablet Take 1 tablet by mouth daily.    Yes [provider]  Potassium 99 MG TABS Take 1 tablet by mouth daily.   Yes [provider]  doxepin (SINEQUAN) 10 MG capsule TAKE 1 CAPSULE BY MOUTH EVERY NIGHT AT BEDTIME Patient not taking: Reported on 12/09/2016 12/31/15   Margaretann Loveless, PA-C  gabapentin (NEURONTIN) 300 MG capsule TAKE 1 TO 2 CAPSULES BY MOUTH EVERY NIGHT AT BEDTIME AS DIRECTED Patient not taking: Reported on 12/09/2016 09/19/15   Lorie Phenix, MD  Insulin Glargine (LANTUS SOLOSTAR) 100 UNIT/ML Solostar Pen Administer 20 units under skin every evening.  Increase by 2 units every 3 days until fasting sugars are in the 150's range. Patient not taking: Reported on 12/09/2016 04/15/16   Margaretann Loveless, PA-C  Insulin Syringe-Needle U-100 29G X 1/2" 0.5 ML MISC Use as directed with insulin. 09/03/16   Margaretann Loveless, PA-C  LORazepam (ATIVAN) 0.5 MG tablet Take 1 tablet (0.5 mg total) by mouth 2 (two) times daily as needed for anxiety. Patient not taking: Reported on 12/09/2016 01/26/15   Lorie Phenix, MD  montelukast (SINGULAIR) 10 MG tablet Take 1 tablet (10 mg total) by mouth at bedtime. Patient not taking: Reported on 12/09/2016 05/17/15   Lorie Phenix, MD  oxybutynin (DITROPAN) 5 MG tablet Take 5 mg by mouth every evening.    [provider]  triamcinolone cream (KENALOG) 0.1 % APPLY AFFECTED RASH TWICE DAILY UNTIL CLEAR. Patient not taking: Reported on 12/09/2016 07/29/16   Margaretann Loveless, PA-C    Inpatient Medications:  . aspirin EC  81 mg Oral Daily  . atorvastatin  40 mg Oral q1800  . enoxaparin (LOVENOX) injection  40 mg Subcutaneous Q24H  . fenofibrate  54 mg Oral Daily  . HYDROcodone-acetaminophen  1 tablet Oral BID  . insulin aspart  0-15 Units Subcutaneous TID WC  . insulin aspart  0-5 Units Subcutaneous QHS  . insulin aspart protamine- aspart  25 Units Subcutaneous Q supper  . levothyroxine  50 mcg Oral QAC breakfast  . LORazepam  1 mg Intravenous Once  .  metoprolol succinate  50 mg Oral Daily  . oxybutynin  5 mg Oral QPM     Allergies:  Allergies  Allergen Reactions  . Diphenhydramine Rash    AGITATION/DELIRIUM  . Metformin Diarrhea  . Oxybutynin Other (See Comments)    dizzy  . Amlodipine Rash  . Ciprofloxacin Rash  . Lisinopril Rash  . Penicillins Rash  . Saxagliptin Rash    Social History   Social History  . Marital status: Widowed    Spouse name: N/A  . Number of children: 3  . Years of education: H/S   Occupational History  . Retired    Social History Main Topics  . Smoking status: Former Smoker    Packs/day: 1.00    Years: 30.00    Quit date: 11/30/1999  . Smokeless tobacco: Never Used  . Alcohol use No  . Drug use: No  . Sexual activity: Not on file   Other Topics Concern  . Not on file   Social History Narrative  . No narrative on file     Family History  Problem Relation  Age of Onset  . Breast cancer Mother   . Pancreatitis Father   . Breast cancer Sister   . Lung cancer Brother   . Melanoma Brother   . Throat cancer Brother      Review of Systems Positive for Neurologic abnormalities Negative for: General:  chills, fever, night sweats or weight changes.  Cardiovascular: PND orthopnea syncope dizziness  Dermatological skin lesions rashes Respiratory: Cough congestion Urologic: Frequent urination urination at night and hematuria Abdominal: negative for nausea, vomiting, diarrhea, bright red blood per rectum, melena, or hematemesis Neurologic: negative for visual changes, and/or hearing changes but positive for slurred speech All other systems reviewed and are otherwise negative except as noted above.  Labs:  Recent Labs  12/09/16 1935 12/10/16 0100 12/10/16 0648  CKTOTAL  --  130  --   TROPONINI 0.77* 0.75* 0.74*   Lab Results  Component Value Date   WBC 9.8 12/10/2016   HGB 12.9 12/10/2016   HCT 38.0 12/10/2016   MCV 81.8 12/10/2016   PLT 269 12/10/2016    Recent  Labs Lab 12/09/16 1935 12/10/16 0100  NA 138 143  K 4.4 4.7  CL 102 108  CO2 27 28  BUN 30* 30*  CREATININE 0.86 0.98  CALCIUM 9.6 9.4  PROT 6.7  --   BILITOT 0.5  --   ALKPHOS 44  --   ALT 20  --   AST 22  --   GLUCOSE 324* 156*   Lab Results  Component Value Date   CHOL 272 (H) 09/04/2015   HDL 33 (L) 09/04/2015   LDLCALC Comment 09/04/2015   TRIG 722 (HH) 09/04/2015   No results found for: DDIMER  Radiology/Studies:  Dg Chest 2 View  Result Date: 12/09/2016 CLINICAL DATA:  Weakness.  Slurred speech.  Elevated troponins. EXAM: CHEST  2 VIEW COMPARISON:  01/26/2015 FINDINGS: Midline trachea. Borderline cardiomegaly. Atherosclerosis in the transverse aorta. No pleural effusion or pneumothorax. Mild biapical pleural thickening. Mild interstitial thickening. No lobar consolidation. IMPRESSION: No acute cardiopulmonary disease. Peribronchial thickening which may relate to chronic bronchitis or smoking. Aortic Atherosclerosis (ICD10-I70.0). Electronically Signed   By: Jeronimo Greaves M.D.   On: 12/09/2016 20:43   Ct Head Wo Contrast  Result Date: 12/09/2016 CLINICAL DATA:  Weakness, slurred speech, dizziness and difficulty walking since last night. History of TIA. EXAM: CT HEAD WITHOUT CONTRAST TECHNIQUE: Contiguous axial images were obtained from the base of the skull through the vertex without intravenous contrast. COMPARISON:  Brain MRI dated 10/30/2009 and head CT dated 08/08/2008. FINDINGS: Brain: Mild generalized age related parenchymal atrophy with commensurate dilatation of the ventricles and sulci. Mild chronic small vessel ischemic changes in the periventricular white matter. There is no mass, hemorrhage, edema or other evidence of acute parenchymal abnormality. No extra-axial hemorrhage. Vascular: There are chronic calcified atherosclerotic changes of the large vessels at the skull base. No unexpected hyperdense vessel. Skull: Normal. Negative for fracture or focal lesion.  Sinuses/Orbits: No acute finding. Other: None. IMPRESSION: No acute findings.  No intracranial mass, hemorrhage or edema. Mild chronic small vessel ischemic change within the periventricular white matter. Electronically Signed   By: Bary Richard M.D.   On: 12/09/2016 19:38   Mr Maxine Glenn Head Wo Contrast  Result Date: 12/09/2016 CLINICAL DATA:  RIGHT-sided weakness and slurred speech beginning yesterday. Difficulty ambulating. History of hypertension, diabetes, atrial fibrillation, lower GI bleed. EXAM: MRI HEAD WITHOUT CONTRAST MRA HEAD WITHOUT CONTRAST TECHNIQUE: Multiplanar, multiecho pulse sequences of the brain and surrounding  structures were obtained without intravenous contrast. Angiographic images of the head were obtained using MRA technique without contrast. COMPARISON:  CT HEAD December 09, 2016 at 1924 hours and MRI of the head Oct 30, 2009 FINDINGS: MRI HEAD FINDINGS BRAIN: No reduced diffusion to suggest acute ischemia. No susceptibility artifact to suggest hemorrhage. The ventricles and sulci are normal for patient's age. LEFT choroidal fissure cyst. Patchy supratentorial white matter FLAIR T2 hyperintensities similar to prior MRI compatible with mild chronic small vessel ischemic disease, less than expected for age. No suspicious parenchymal signal, mass or mass effect. No abnormal extra-axial fluid collections. VASCULAR: Normal major intracranial vascular flow voids present at skull base. SKULL AND UPPER CERVICAL SPINE: No abnormal sellar expansion. No suspicious calvarial bone marrow signal. Craniocervical junction maintained. SINUSES/ORBITS: Mild paranasal sinus mucosal thickening. Mastoid air cells are well aerated. The included ocular globes and orbital contents are non-suspicious. OTHER: Patient is edentulous. MRA HEAD FINDINGS- mildly motion degraded examination. ANTERIOR CIRCULATION: Normal flow related enhancement of the included cervical, petrous, cavernous and supraclinoid internal carotid  arteries. Tortuous LEFT distal cervical internal carotid artery with mild focal narrowing, seen with flow artifact or atherosclerosis. Patent anterior communicating artery. Severe stenosis proximal LEFT A2 segment and, severe stenosis RIGHT mid A2 segment. Moderate stenosis distal LEFT M1 segment severe stenosis RIGHT M2 segment. No large vessel occlusion, or aneurysm. POSTERIOR CIRCULATION: Codominant vertebral artery's. Decreased flow related enhancement with normal caliber bilateral proximal V4 segments favoring artifact. , however there is high-grade stenosis versus occluded distal LEFT V4 segment. Tandem severe stenoses basilar artery. Robust LEFT posterior communicating artery. Focally occluded versus severely stenotic RIGHT P1 2 junction with tandem severe stenoses bilateral mid and distal posterior cerebral arteries. No large vessel occlusion, aneurysm. ANATOMIC VARIANTS: None. Source images and MIP images were reviewed. IMPRESSION: MRI HEAD: Stable negative noncontrast MRI of the head for age. MRA HEAD: 1. No emergent large vessel occlusion on this mildly motion degraded examination. 2. 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 Given severity of findings, recommend CTA HEAD for further characterization. Electronically Signed   By: Awilda Metro M.D.   On: 12/09/2016 22:26   Mr Brain Wo Contrast  Result Date: 12/09/2016 CLINICAL DATA:  RIGHT-sided weakness and slurred speech beginning yesterday. Difficulty ambulating. History of hypertension, diabetes, atrial fibrillation, lower GI bleed. EXAM: MRI HEAD WITHOUT CONTRAST MRA HEAD WITHOUT CONTRAST TECHNIQUE: Multiplanar, multiecho pulse sequences of the brain and surrounding structures were obtained without intravenous contrast. Angiographic images of the head were obtained using MRA technique without contrast. COMPARISON:  CT HEAD December 09, 2016 at 1924  hours and MRI of the head Oct 30, 2009 FINDINGS: MRI HEAD FINDINGS BRAIN: No reduced diffusion to suggest acute ischemia. No susceptibility artifact to suggest hemorrhage. The ventricles and sulci are normal for patient's age. LEFT choroidal fissure cyst. Patchy supratentorial white matter FLAIR T2 hyperintensities similar to prior MRI compatible with mild chronic small vessel ischemic disease, less than expected for age. No suspicious parenchymal signal, mass or mass effect. No abnormal extra-axial fluid collections. VASCULAR: Normal major intracranial vascular flow voids present at skull base. SKULL AND UPPER CERVICAL SPINE: No abnormal sellar expansion. No suspicious calvarial bone marrow signal. Craniocervical junction maintained. SINUSES/ORBITS: Mild paranasal sinus mucosal thickening. Mastoid air cells are well aerated. The included ocular globes and orbital contents are non-suspicious. OTHER: Patient is edentulous. MRA HEAD FINDINGS- mildly motion degraded examination. ANTERIOR CIRCULATION: Normal flow related enhancement  of the included cervical, petrous, cavernous and supraclinoid internal carotid arteries. Tortuous LEFT distal cervical internal carotid artery with mild focal narrowing, seen with flow artifact or atherosclerosis. Patent anterior communicating artery. Severe stenosis proximal LEFT A2 segment and, severe stenosis RIGHT mid A2 segment. Moderate stenosis distal LEFT M1 segment severe stenosis RIGHT M2 segment. No large vessel occlusion, or aneurysm. POSTERIOR CIRCULATION: Codominant vertebral artery's. Decreased flow related enhancement with normal caliber bilateral proximal V4 segments favoring artifact. , however there is high-grade stenosis versus occluded distal LEFT V4 segment. Tandem severe stenoses basilar artery. Robust LEFT posterior communicating artery. Focally occluded versus severely stenotic RIGHT P1 2 junction with tandem severe stenoses bilateral mid and distal posterior  cerebral arteries. No large vessel occlusion, aneurysm. ANATOMIC VARIANTS: None. Source images and MIP images were reviewed. IMPRESSION: MRI HEAD: Stable negative noncontrast MRI of the head for age. MRA HEAD: 1. No emergent large vessel occlusion on this mildly motion degraded examination. 2. 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 Given severity of findings, recommend CTA HEAD for further characterization. Electronically Signed   By: Awilda Metro M.D.   On: 12/09/2016 22:26   US Carotid Bilateral  Result Date: 12/10/2016 CLINICAL DATA:  83 year old female with a history of TIA. Cardiovascular risk factors include known prior stroke/ TIA, known coronary disease, diabetes EXAM: BILATERAL CAROTID DUPLEX ULTRASOUND TECHNIQUE: Wallace Cullens scale imaging, color Doppler and duplex ultrasound were performed of bilateral carotid and vertebral arteries in the neck. COMPARISON:  None FINDINGS: Criteria: Quantification of carotid stenosis is based on velocity parameters that correlate the residual internal carotid diameter with NASCET-based stenosis levels, using the diameter of the distal internal carotid lumen as the denominator for stenosis measurement. The following velocity measurements were obtained: RIGHT ICA:  Systolic 67 cm/sec, Diastolic 15 cm/sec CCA:  77 cm/sec SYSTOLIC ICA/CCA RATIO:  0.9 ECA:  129 cm/sec LEFT ICA:  Systolic 78 cm/sec, Diastolic 22 cm/sec CCA:  61 cm/sec SYSTOLIC ICA/CCA RATIO:  1.3 ECA:  120 cm/sec Right Brachial SBP: Not acquired Left Brachial SBP: Not acquired RIGHT CAROTID ARTERY: No significant calcified disease of the right common carotid artery. Intermediate waveform maintained. Heterogeneous plaque without significant calcifications at the right carotid bifurcation. Low resistance waveform of the right ICA. No significant tortuosity. RIGHT VERTEBRAL ARTERY: Antegrade flow with low resistance  waveform. LEFT CAROTID ARTERY: No significant calcified disease of the left common carotid artery. Intermediate waveform maintained. Heterogeneous plaque at the left carotid bifurcation without significant calcifications. Low resistance waveform of the left ICA. LEFT VERTEBRAL ARTERY:  Antegrade flow with low resistance waveform. IMPRESSION: Color duplex indicates minimal heterogeneous plaque, with no hemodynamically significant stenosis by duplex criteria in the extracranial cerebrovascular circulation. Signed, Yvone Neu. Loreta Ave, DO [facility] Radiology Electronically Signed   By: Gilmer Mor D.O.   On: 12/10/2016 12:12    EKG: Normal sinus rhythm  Weights: Filed Weights   12/09/16 1914 12/09/16 2235 12/10/16 0441  Weight: 70.8 kg (156 lb) 71.7 kg (158 lb 1 oz) 70.4 kg (155 lb 5 oz)     Physical Exam: Blood pressure (!) 157/49, pulse 63, temperature (!) 97.4 F (36.3 C), temperature source Oral, resp. rate 16, height 5\' 2"  (1.575 m), weight 70.4 kg (155 lb 5 oz), SpO2 96 %. Body mass index is 28.41 kg/m. General: Well developed, well nourished, in no acute distress. Head eyes ears nose throat: Normocephalic, atraumatic, sclera non-icteric, no xanthomas,  nares are without discharge. No apparent thyromegaly and/or mass  Lungs: Normal respiratory effort.  no wheezes, no rales, no rhonchi.  Heart: RRR with normal S1 S2. no murmur gallop, no rub, PMI is normal size and placement, carotid upstroke normal without bruit, jugular venous pressure is normal Abdomen: Soft, non-tender, non-distended with normoactive bowel sounds. No hepatomegaly. No rebound/guarding. No obvious abdominal masses. Abdominal aorta is normal size without bruit Extremities: No edema. no cyanosis, no clubbing, no ulcers  Peripheral : 2+ bilateral upper extremity pulses, 2+ bilateral femoral pulses, 2+ bilateral dorsal pedal pulse Neuro: Alert and oriented. No facial asymmetry.  No focal deficit. Moves all extremities spontaneously. Musculoskeletal: Normal muscle tone without kyphosis Psych:  Responds to questions appropriately with a normal affect.    Assessment: 83 year old female with previous coronary artery disease PCI and stent placement or myocardial infarction essential hypertension mixed hyperlipidemia diabetes with complication atrial fibrillation paroxysmal nonvalvular in nature currently in normal sinus rhythm with a TIA and/or stroke with vascular disease and an elevated troponin without evidence of EKG changes or chest pain  Plan: 1. Continue supportive care of TIA and stroke with aspirin and Plavix. 2. No further intervention from the cardiac standpoint due to no evidence of chest discomfort EKG changes with a normal LV function by echocardiogram 3. Metoprolol for maintenance of normal sinus rhythm and would consider anticoagulation for further risk reduction in stroke with paroxysmal nonvalvular atrial fibrillation 4. High intensity cholesterol therapy with atorvastatin without change 5. Begin ambulation and following for improvements of symptoms a and/or TIA and following for evidence of chest discomfort or true anginal symptoms requiring further intervention at this time 6. If ambulating well okay for discharge to home with follow-up with cardiology for adjustments of medications and further address of above  Signed, Lamar Blinks M.D. [facility] Cardiology 12/10/2016, 1:02 PM

## 2016-12-10 NOTE — Progress Notes (Signed)
PT Cancellation Note  Patient Details Name: Peggy [name] MRN: [id number] DOB: 10-31-1928   Cancelled Treatment:    Reason Eval/Treat Not Completed: Patient at procedure or test/unavailable (Consult received and chart reviewed.  Patient currently off unit for diagnostic testing. Will re-attempt at later time/date as medically appropriate and available.)   Teal Bontrager H. Manson Passey, PT, DPT, NCS 12/10/16, 11:06 AM [phone]

## 2016-12-10 NOTE — Discharge Summary (Signed)
[facility] -  at [facility]   PATIENT NAME: Peggy [name]    MR#:  [id number]  DATE OF BIRTH:  1928-06-29  DATE OF ADMISSION:  12/09/2016 ADMITTING PHYSICIAN: Milagros Loll, MD  DATE OF DISCHARGE: 12/10/2016  PRIMARY CARE PHYSICIAN: Dr. Marikay Alar    ADMISSION DIAGNOSIS:  Exertional dyspnea [R06.09] CVA (cerebral vascular accident) (HCC) [I63.9] Troponin I above reference range [R74.8] Transient cerebral ischemia, unspecified type [G45.9]  DISCHARGE DIAGNOSIS:  Transient ischemic attack  SECONDARY DIAGNOSIS:   Past Medical History:  Diagnosis Date  . A-fib (HCC)   . Allergy   . Arthritis   . Diabetes mellitus without complication (HCC)   . GERD (gastroesophageal reflux disease)   . Hyperlipidemia   . Hypertension   . TIA (transient ischemic attack)     HOSPITAL COURSE:   1. Transient ischemic attack. MRI of the brain was negative for acute stroke. The patient came in with right-sided weakness and slurred speech. All symptoms have resolved and the patient wants to go home. Echocardiogram does not show a source of stroke. Carotid ultrasound negative. The patient does have a history of atrial fibrillation but had a major bleed on Xaralto. The patient does take a baby aspirin already. The family and patient are hesitant on any blood thinners such as Xaralto at this time. Case discussed with neurology and will add Plavix 75 mg daily. Add Lipitor 40 mg daily. 2. Ankle pain and pain at night. Could be restless leg syndrome start Requip at night 3. Type 2 diabetes mellitus. I advised the patient that her 70/30 insulin is a twice a day medication needs to be taken with breakfast and dinner. Patient and family were advised of this. Change dose to 18 units twice a day. 4. Paroxysmal atrial fibrillation. With major GI bleed in the past does not want to be on a major blood thinner at this time. On low-dose metoprolol 5. Borderline elevated troponin that remained  flat during the entire stay. Could be secondary to TIA. Cardiology did not want to do any further workup. 6. Essential hypertension on metoprolol 7. Hyperlipidemia unspecified and hypertriglyceridemia. On fenofibrate and Lipitor 8. GERD. Continue home medications  DISCHARGE CONDITIONS:   Satisfactory  CONSULTS OBTAINED:  Treatment Team:  Thana Farr, MD Lamar Blinks, MD  DRUG ALLERGIES:   Allergies  Allergen Reactions  . Diphenhydramine Rash    AGITATION/DELIRIUM  . Metformin Diarrhea  . Oxybutynin Other (See Comments)    dizzy  . Amlodipine Rash  . Ciprofloxacin Rash  . Lisinopril Rash  . Penicillins Rash  . Saxagliptin Rash    DISCHARGE MEDICATIONS:   Current Discharge Medication List    START taking these medications   Details  atorvastatin (LIPITOR) 40 MG tablet Take 1 tablet (40 mg total) by mouth daily at 6 PM. Qty: 30 tablet, Refills: 0    clopidogrel (PLAVIX) 75 MG tablet Take 1 tablet (75 mg total) by mouth daily. Qty: 30 tablet, Refills: 0    rOPINIRole (REQUIP) 0.25 MG tablet Take 1 tablet (0.25 mg total) by mouth at bedtime. Qty: 30 tablet, Refills: 0      CONTINUE these medications which have CHANGED   Details  insulin NPH-regular Human (NOVOLIN 70/30) (70-30) 100 UNIT/ML injection Inject 18 Units into the skin 2 (two) times daily with a meal. Qty: 10 mL, Refills: 11    metoprolol succinate (TOPROL-XL) 50 MG 24 hr tablet TAKE 0.5 TABLET BY MOUTH EVERY DAY Qty: 90 tablet, Refills: 1  Associated Diagnoses: Essential (primary) hypertension      CONTINUE these medications which have NOT CHANGED   Details  acetaminophen (TYLENOL) 650 MG CR tablet Take 650 mg by mouth every 8 (eight) hours as needed for pain.    aspirin 81 MG tablet Take 81 mg by mouth daily.     fenofibrate (TRICOR) 145 MG tablet Take 1 tablet (145 mg total) by mouth daily. Qty: 90 tablet, Refills: 1   Associated Diagnoses: Hypertriglyceridemia    fluocinonide  ointment (LIDEX) 0.05 % Apply 1 application topically daily as needed. Refills: 1    fluticasone (FLONASE) 50 MCG/ACT nasal spray USE ONE SPRAY IN EACH NOSTRIL DAILY Qty: 16 g, Refills: 5   Associated Diagnoses: Other allergic rhinitis    HYDROcodone-acetaminophen (NORCO/VICODIN) 5-325 MG tablet Take 1 tablet by mouth 2 (two) times daily. As needed for chronic low back pain Qty: 60 tablet, Refills: 0   Associated Diagnoses: Chronic low back pain    levothyroxine (SYNTHROID, LEVOTHROID) 50 MCG tablet Take 1 tablet (50 mcg total) by mouth daily. PATIENT NEEDS TO SCHEDULE OFFICE VISIT FOR FOLLOW UP Qty: 90 tablet, Refills: 0   Associated Diagnoses: Hypothyroidism, unspecified hypothyroidism type    Melatonin 1 MG TABS Take 1 tablet by mouth at bedtime as needed. Enhanced Sleep with melatonin. Active ingredient Sendara 200mg  and 1.5 of melatonin    Multiple Vitamin (MULTIVITAMIN) tablet Take 1 tablet by mouth daily.    Potassium 99 MG TABS Take 1 tablet by mouth daily.    Insulin Syringe-Needle U-100 29G X 1/2" 0.5 ML MISC Use as directed with insulin. Qty: 100 each, Refills: 5   Associated Diagnoses: Type 2 diabetes mellitus without complication, without long-term current use of insulin (HCC)    oxybutynin (DITROPAN) 5 MG tablet Take 5 mg by mouth every evening.      STOP taking these medications     hydrOXYzine (ATARAX/VISTARIL) 25 MG tablet      doxepin (SINEQUAN) 10 MG capsule      gabapentin (NEURONTIN) 300 MG capsule      Insulin Glargine (LANTUS SOLOSTAR) 100 UNIT/ML Solostar Pen      LORazepam (ATIVAN) 0.5 MG tablet      montelukast (SINGULAIR) 10 MG tablet      triamcinolone cream (KENALOG) 0.1 %          DISCHARGE INSTRUCTIONS:   Follow-up PMD one week  If you experience worsening of your admission symptoms, develop shortness of breath, life threatening emergency, suicidal or homicidal thoughts you must seek medical attention immediately by calling 911 or  calling your MD immediately  if symptoms less severe.  You Must read complete instructions/literature along with all the possible adverse reactions/side effects for all the Medicines you take and that have been prescribed to you. Take any new Medicines after you have completely understood and accept all the possible adverse reactions/side effects.   Please note  You were cared for by a hospitalist during your hospital stay. If you have any questions about your discharge medications or the care you received while you were in the hospital after you are discharged, you can call the unit and asked to speak with the hospitalist on call if the hospitalist that took care of you is not available. Once you are discharged, your primary care physician will handle any further medical issues. Please note that NO REFILLS for any discharge medications will be authorized once you are discharged, as it is imperative that you return to your primary care  physician (or establish a relationship with a primary care physician if you do not have one) for your aftercare needs so that they can reassess your need for medications and monitor your lab values.    Today   CHIEF COMPLAINT:   Chief Complaint  Patient presents with  . Altered Mental Status    HISTORY OF PRESENT ILLNESS:  Sahory Nordling  is a 83 y.o. female came in with slurred speech and some weakness which has resolved   VITAL SIGNS:  Blood pressure (!) 157/49, pulse 63, temperature (!) 97.4 F (36.3 C), temperature source Oral, resp. rate 16, height 5\' 2"  (1.575 m), weight 70.4 kg (155 lb 5 oz), SpO2 96 %.    PHYSICAL EXAMINATION:  GENERAL:  83 y.o.-year-old patient lying in the bed with no acute distress.  EYES: Pupils equal, round, reactive to light and accommodation. No scleral icterus. Extraocular muscles intact.  HEENT: Head atraumatic, normocephalic. Oropharynx and nasopharynx clear.  NECK:  Supple, no jugular venous distention. No thyroid  enlargement, no tenderness.  LUNGS: Normal breath sounds bilaterally, no wheezing, rales,rhonchi or crepitation. No use of accessory muscles of respiration.  CARDIOVASCULAR: S1, S2 normal. No murmurs, rubs, or gallops.  ABDOMEN: Soft, non-tender, non-distended. Bowel sounds present. No organomegaly or mass.  EXTREMITIES: No pedal edema, cyanosis, or clubbing.  NEUROLOGIC: Cranial nerves II through XII are intact. Muscle strength 5/5 in all extremities. Sensation intact. Gait not checked.  PSYCHIATRIC: The patient is alert and oriented x 3.  SKIN: No obvious rash, lesion, or ulcer.   DATA REVIEW:   CBC  Recent Labs Lab 12/10/16 0100  WBC 9.8  HGB 12.9  HCT 38.0  PLT 269    Chemistries   Recent Labs Lab 12/09/16 1935 12/10/16 0100  NA 138 143  K 4.4 4.7  CL 102 108  CO2 27 28  GLUCOSE 324* 156*  BUN 30* 30*  CREATININE 0.86 0.98  CALCIUM 9.6 9.4  AST 22  --   ALT 20  --   ALKPHOS 44  --   BILITOT 0.5  --     Cardiac Enzymes  Recent Labs Lab 12/10/16 0648  TROPONINI 0.74*     RADIOLOGY:  Dg Chest 2 View  Result Date: 12/09/2016 CLINICAL DATA:  Weakness.  Slurred speech.  Elevated troponins. EXAM: CHEST  2 VIEW COMPARISON:  01/26/2015 FINDINGS: Midline trachea. Borderline cardiomegaly. Atherosclerosis in the transverse aorta. No pleural effusion or pneumothorax. Mild biapical pleural thickening. Mild interstitial thickening. No lobar consolidation. IMPRESSION: No acute cardiopulmonary disease. Peribronchial thickening which may relate to chronic bronchitis or smoking. Aortic Atherosclerosis (ICD10-I70.0). Electronically Signed   By: Jeronimo Greaves M.D.   On: 12/09/2016 20:43   Ct Head Wo Contrast  Result Date: 12/09/2016 CLINICAL DATA:  Weakness, slurred speech, dizziness and difficulty walking since last night. History of TIA. EXAM: CT HEAD WITHOUT CONTRAST TECHNIQUE: Contiguous axial images were obtained from the base of the skull through the vertex without  intravenous contrast. COMPARISON:  Brain MRI dated 10/30/2009 and head CT dated 08/08/2008. FINDINGS: Brain: Mild generalized age related parenchymal atrophy with commensurate dilatation of the ventricles and sulci. Mild chronic small vessel ischemic changes in the periventricular white matter. There is no mass, hemorrhage, edema or other evidence of acute parenchymal abnormality. No extra-axial hemorrhage. Vascular: There are chronic calcified atherosclerotic changes of the large vessels at the skull base. No unexpected hyperdense vessel. Skull: Normal. Negative for fracture or focal lesion. Sinuses/Orbits: No acute finding. Other: None. IMPRESSION: No  acute findings.  No intracranial mass, hemorrhage or edema. Mild chronic small vessel ischemic change within the periventricular white matter. Electronically Signed   By: Bary  M.D.   On: 12/09/2016 19:38   Mr Maxine Glenn Head Wo Contrast  Result Date: 12/09/2016 CLINICAL DATA:  RIGHT-sided weakness and slurred speech beginning yesterday. Difficulty ambulating. History of hypertension, diabetes, atrial fibrillation, lower GI bleed. EXAM: MRI HEAD WITHOUT CONTRAST MRA HEAD WITHOUT CONTRAST TECHNIQUE: Multiplanar, multiecho pulse sequences of the brain and surrounding structures were obtained without intravenous contrast. Angiographic images of the head were obtained using MRA technique without contrast. COMPARISON:  CT HEAD December 09, 2016 at 1924 hours and MRI of the head Oct 30, 2009 FINDINGS: MRI HEAD FINDINGS BRAIN: No reduced diffusion to suggest acute ischemia. No susceptibility artifact to suggest hemorrhage. The ventricles and sulci are normal for patient's age. LEFT choroidal fissure cyst. Patchy supratentorial white matter FLAIR T2 hyperintensities similar to prior MRI compatible with mild chronic small vessel ischemic disease, less than expected for age. No suspicious parenchymal signal, mass or mass effect. No abnormal extra-axial fluid collections.  VASCULAR: Normal major intracranial vascular flow voids present at skull base. SKULL AND UPPER CERVICAL SPINE: No abnormal sellar expansion. No suspicious calvarial bone marrow signal. Craniocervical junction maintained. SINUSES/ORBITS: Mild paranasal sinus mucosal thickening. Mastoid air cells are well aerated. The included ocular globes and orbital contents are non-suspicious. OTHER: Patient is edentulous. MRA HEAD FINDINGS- mildly motion degraded examination. ANTERIOR CIRCULATION: Normal flow related enhancement of the included cervical, petrous, cavernous and supraclinoid internal carotid arteries. Tortuous LEFT distal cervical internal carotid artery with mild focal narrowing, seen with flow artifact or atherosclerosis. Patent anterior communicating artery. Severe stenosis proximal LEFT A2 segment and, severe stenosis RIGHT mid A2 segment. Moderate stenosis distal LEFT M1 segment severe stenosis RIGHT M2 segment. No large vessel occlusion, or aneurysm. POSTERIOR CIRCULATION: Codominant vertebral artery's. Decreased flow related enhancement with normal caliber bilateral proximal V4 segments favoring artifact. , however there is high-grade stenosis versus occluded distal LEFT V4 segment. Tandem severe stenoses basilar artery. Robust LEFT posterior communicating artery. Focally occluded versus severely stenotic RIGHT P1 2 junction with tandem severe stenoses bilateral mid and distal posterior cerebral arteries. No large vessel occlusion, aneurysm. ANATOMIC VARIANTS: None. Source images and MIP images were reviewed. IMPRESSION: MRI HEAD: Stable negative noncontrast MRI of the head for age. MRA HEAD: 1. No emergent large vessel occlusion on this mildly motion degraded examination. 2. 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 Given severity of findings, recommend CTA HEAD for further characterization. Electronically Signed    By: Awilda Metro M.D.   On: 12/09/2016 22:26   Mr Brain Wo Contrast  Result Date: 12/09/2016 CLINICAL DATA:  RIGHT-sided weakness and slurred speech beginning yesterday. Difficulty ambulating. History of hypertension, diabetes, atrial fibrillation, lower GI bleed. EXAM: MRI HEAD WITHOUT CONTRAST MRA HEAD WITHOUT CONTRAST TECHNIQUE: Multiplanar, multiecho pulse sequences of the brain and surrounding structures were obtained without intravenous contrast. Angiographic images of the head were obtained using MRA technique without contrast. COMPARISON:  CT HEAD December 09, 2016 at 1924 hours and MRI of the head Oct 30, 2009 FINDINGS: MRI HEAD FINDINGS BRAIN: No reduced diffusion to suggest acute ischemia. No susceptibility artifact to suggest hemorrhage. The ventricles and sulci are normal for patient's age. LEFT choroidal fissure cyst. Patchy supratentorial white matter FLAIR T2 hyperintensities similar to prior MRI compatible with mild chronic small vessel ischemic disease,  less than expected for age. No suspicious parenchymal signal, mass or mass effect. No abnormal extra-axial fluid collections. VASCULAR: Normal major intracranial vascular flow voids present at skull base. SKULL AND UPPER CERVICAL SPINE: No abnormal sellar expansion. No suspicious calvarial bone marrow signal. Craniocervical junction maintained. SINUSES/ORBITS: Mild paranasal sinus mucosal thickening. Mastoid air cells are well aerated. The included ocular globes and orbital contents are non-suspicious. OTHER: Patient is edentulous. MRA HEAD FINDINGS- mildly motion degraded examination. ANTERIOR CIRCULATION: Normal flow related enhancement of the included cervical, petrous, cavernous and supraclinoid internal carotid arteries. Tortuous LEFT distal cervical internal carotid artery with mild focal narrowing, seen with flow artifact or atherosclerosis. Patent anterior communicating artery. Severe stenosis proximal LEFT A2 segment and, severe  stenosis RIGHT mid A2 segment. Moderate stenosis distal LEFT M1 segment severe stenosis RIGHT M2 segment. No large vessel occlusion, or aneurysm. POSTERIOR CIRCULATION: Codominant vertebral artery's. Decreased flow related enhancement with normal caliber bilateral proximal V4 segments favoring artifact. , however there is high-grade stenosis versus occluded distal LEFT V4 segment. Tandem severe stenoses basilar artery. Robust LEFT posterior communicating artery. Focally occluded versus severely stenotic RIGHT P1 2 junction with tandem severe stenoses bilateral mid and distal posterior cerebral arteries. No large vessel occlusion, aneurysm. ANATOMIC VARIANTS: None. Source images and MIP images were reviewed. IMPRESSION: MRI HEAD: Stable negative noncontrast MRI of the head for age. MRA HEAD: 1. No emergent large vessel occlusion on this mildly motion degraded examination. 2. 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 Given severity of findings, recommend CTA HEAD for further characterization. Electronically Signed   By: Awilda Metro M.D.   On: 12/09/2016 22:26   US Carotid Bilateral  Result Date: 12/10/2016 CLINICAL DATA:  83 year old female with a history of TIA. Cardiovascular risk factors include known prior stroke/ TIA, known coronary disease, diabetes EXAM: BILATERAL CAROTID DUPLEX ULTRASOUND TECHNIQUE: Wallace Cullens scale imaging, color Doppler and duplex ultrasound were performed of bilateral carotid and vertebral arteries in the neck. COMPARISON:  None FINDINGS: Criteria: Quantification of carotid stenosis is based on velocity parameters that correlate the residual internal carotid diameter with NASCET-based stenosis levels, using the diameter of the distal internal carotid lumen as the denominator for stenosis measurement. The following velocity measurements were obtained: RIGHT ICA:  Systolic 67 cm/sec, Diastolic  15 cm/sec CCA:  77 cm/sec SYSTOLIC ICA/CCA RATIO:  0.9 ECA:  129 cm/sec LEFT ICA:  Systolic 78 cm/sec, Diastolic 22 cm/sec CCA:  61 cm/sec SYSTOLIC ICA/CCA RATIO:  1.3 ECA:  120 cm/sec Right Brachial SBP: Not acquired Left Brachial SBP: Not acquired RIGHT CAROTID ARTERY: No significant calcified disease of the right common carotid artery. Intermediate waveform maintained. Heterogeneous plaque without significant calcifications at the right carotid bifurcation. Low resistance waveform of the right ICA. No significant tortuosity. RIGHT VERTEBRAL ARTERY: Antegrade flow with low resistance waveform. LEFT CAROTID ARTERY: No significant calcified disease of the left common carotid artery. Intermediate waveform maintained. Heterogeneous plaque at the left carotid bifurcation without significant calcifications. Low resistance waveform of the left ICA. LEFT VERTEBRAL ARTERY:  Antegrade flow with low resistance waveform. IMPRESSION: Color duplex indicates minimal heterogeneous plaque, with no hemodynamically significant stenosis by duplex criteria in the extracranial cerebrovascular circulation. Signed, Yvone Neu. Loreta Ave, DO [facility] Electronically Signed   By: Gilmer Mor D.O.   On: 12/10/2016 12:12     Management plans discussed with the patient, family and they are in agreement.  CODE STATUS:     Code Status Orders        Start     Ordered   12/09/16 2128  Do not attempt resuscitation (DNR)  Continuous    Question Answer Comment  In the event of cardiac or respiratory ARREST Do not call a "code blue"   In the event of cardiac or respiratory ARREST Do not perform Intubation, CPR, defibrillation or ACLS   In the event of cardiac or respiratory ARREST Use medication by any route, position, wound care, and other measures to relive pain and suffering. May use oxygen, suction and manual treatment of airway obstruction as needed for comfort.       12/09/16 2128    Code Status History    Date Active Date Inactive Code Status Order ID Comments User Context   This patient has a current code status but no historical code status.    Advance Directive Documentation     Most Recent Value  Type of Advance Directive  Healthcare Power of Attorney, Living will  Pre-existing out of facility DNR order (yellow form or pink MOST form)  -  "MOST" Form in Place?  -      TOTAL TIME TAKING CARE OF THIS PATIENT: 40 minutes.    Alford Highland M.D on 12/10/2016 at 3:07 PM  Between 7am to 6pm - Pager - [phone]  After 6pm go to [email or web address] - password [email or web address]  [employer] [facility] Office  [phone]  CC: Primary care physician; Dr. Marikay Alar

## 2016-12-10 NOTE — Progress Notes (Signed)
Discussed discharge instructions and medications with pt and both her daughters. IV removed. All questions addressed. Pt transported home via car by both her daughters.

## 2016-12-10 NOTE — Progress Notes (Signed)
OT Cancellation Note  Patient Details Name: NAPORSHA PULSIPHER MRN: [id number] DOB: 1929/02/01   Cancelled Treatment:     Patient with elevated levels of troponin and has an active cardiology consult.  Will wait for consult to be completed prior to seeing for OT evaluation.   Griselda Tosh T Jacquelin Krajewski, OTR/L, CLT   Rainna Nearhood 12/10/2016, 10:39 AM

## 2016-12-10 NOTE — Progress Notes (Signed)
SLP Cancellation Note  Patient Details Name: Peggy [name] MRN: [id number] DOB: 1928/08/14   Cancelled treatment:       Reason Eval/Treat Not Completed: SLP screened, no needs identified, will sign off (chart reviewed; NSG then pt, friend consulted at lunch). Pt was eating her meal w/ a friend present in room. Pt denied any difficulty swallowing and is currently on a regular diet; tolerates swallowing pills w/ water per NSG. Pt conversed at conversational level w/ SLP w/out deficits noted; pt and friend denied any speech-language deficits.  No further skilled ST services indicated as pt appears at her baseline. Pt agreed. NSG to reconsult if any change in status.     Jerilynn Som, MS, CCC-SLP Anzley Dibbern 12/10/2016, 2:06 PM

## 2016-12-10 NOTE — Progress Notes (Signed)
*  PRELIMINARY RESULTS* Echocardiogram 2D Echocardiogram has been performed.  Cristela Blue 12/10/2016, 9:26 AM

## 2016-12-11 LAB — HEMOGLOBIN A1C
Hgb A1c MFr Bld: 8.8 % — ABNORMAL HIGH (ref 4.8–5.6)
MEAN PLASMA GLUCOSE: 206 mg/dL

## 2016-12-15 ENCOUNTER — Other Ambulatory Visit: Payer: Self-pay | Admitting: Physician Assistant

## 2016-12-15 DIAGNOSIS — J3089 Other allergic rhinitis: Secondary | ICD-10-CM

## 2016-12-15 NOTE — Telephone Encounter (Signed)
[facility] faxed a request for the following medication. Thanks CC  fluticasone (FLONASE) 50 MCG/ACT nasal spray  >Shake liquid and use 1 spray in each nostril daily.

## 2017-01-07 ENCOUNTER — Ambulatory Visit: Payer: Medicare Other | Admitting: Family Medicine

## 2017-02-23 ENCOUNTER — Ambulatory Visit
Admission: RE | Admit: 2017-02-23 | Discharge: 2017-02-23 | Payer: MEDICARE | Attending: Dermatology | Admitting: Dermatology

## 2017-02-23 ENCOUNTER — Other Ambulatory Visit: Payer: Self-pay | Admitting: Internal Medicine

## 2017-02-23 DIAGNOSIS — L308 Other specified dermatitis: Principal | ICD-10-CM

## 2017-02-23 DIAGNOSIS — R222 Localized swelling, mass and lump, trunk: Secondary | ICD-10-CM

## 2017-02-23 DIAGNOSIS — I69328 Other speech and language deficits following cerebral infarction: Secondary | ICD-10-CM | POA: Insufficient documentation

## 2017-02-23 MED ORDER — CLOBETASOL 0.05 % TOPICAL OINTMENT
Freq: Two times a day (BID) | TOPICAL | 1 refills | 0 days | Status: CP
Start: 2017-02-23 — End: 2017-06-05

## 2017-03-06 ENCOUNTER — Ambulatory Visit
Admission: RE | Admit: 2017-03-06 | Discharge: 2017-03-06 | Disposition: A | Discharge status: Home / Self Care | Payer: Medicare Other | Source: Ambulatory Visit | Attending: Internal Medicine | Admitting: Internal Medicine

## 2017-03-06 DIAGNOSIS — I700 Atherosclerosis of aorta: Secondary | ICD-10-CM | POA: Insufficient documentation

## 2017-03-06 DIAGNOSIS — R222 Localized swelling, mass and lump, trunk: Secondary | ICD-10-CM | POA: Insufficient documentation

## 2017-03-06 DIAGNOSIS — I2510 Atherosclerotic heart disease of native coronary artery without angina pectoris: Secondary | ICD-10-CM | POA: Insufficient documentation

## 2017-04-07 DIAGNOSIS — R42 Dizziness and giddiness: Secondary | ICD-10-CM | POA: Insufficient documentation

## 2017-04-14 DIAGNOSIS — I779 Disorder of arteries and arterioles, unspecified: Secondary | ICD-10-CM | POA: Insufficient documentation

## 2017-04-14 DIAGNOSIS — I739 Peripheral vascular disease, unspecified: Secondary | ICD-10-CM

## 2017-04-14 DIAGNOSIS — Z0000 Encounter for general adult medical examination without abnormal findings: Secondary | ICD-10-CM | POA: Insufficient documentation

## 2017-04-27 ENCOUNTER — Ambulatory Visit
Admission: RE | Admit: 2017-04-27 | Discharge: 2017-04-27 | Payer: MEDICARE | Attending: Dermatology | Admitting: Dermatology

## 2017-04-27 DIAGNOSIS — Z79899 Other long term (current) drug therapy: Secondary | ICD-10-CM

## 2017-04-27 DIAGNOSIS — L309 Dermatitis, unspecified: Principal | ICD-10-CM

## 2017-04-27 DIAGNOSIS — R7612 Nonspecific reaction to cell mediated immunity measurement of gamma interferon antigen response without active tuberculosis: Secondary | ICD-10-CM

## 2017-05-18 ENCOUNTER — Ambulatory Visit
Admission: RE | Admit: 2017-05-18 | Discharge: 2017-05-18 | Disposition: A | Discharge status: Home / Self Care | Payer: Medicare Other | Source: Ambulatory Visit | Attending: Family Medicine | Admitting: Family Medicine

## 2017-05-18 ENCOUNTER — Other Ambulatory Visit (HOSPITAL_COMMUNITY): Payer: Self-pay | Admitting: Family Medicine

## 2017-05-18 DIAGNOSIS — R911 Solitary pulmonary nodule: Secondary | ICD-10-CM | POA: Diagnosis not present

## 2017-05-18 DIAGNOSIS — I700 Atherosclerosis of aorta: Secondary | ICD-10-CM | POA: Diagnosis not present

## 2017-05-18 DIAGNOSIS — R7611 Nonspecific reaction to tuberculin skin test without active tuberculosis: Secondary | ICD-10-CM | POA: Diagnosis present

## 2017-05-18 DIAGNOSIS — R7612 Nonspecific reaction to cell mediated immunity measurement of gamma interferon antigen response without active tuberculosis: Secondary | ICD-10-CM

## 2017-06-05 ENCOUNTER — Ambulatory Visit: Admit: 2017-06-05 | Discharge: 2017-06-06 | Payer: MEDICARE | Attending: Dermatology | Primary: Dermatology

## 2017-06-05 DIAGNOSIS — L309 Dermatitis, unspecified: Principal | ICD-10-CM

## 2017-06-05 MED ORDER — TRIAMCINOLONE 0.025% IN MODIFIED EUCERIN CREAM
Freq: Two times a day (BID) | TOPICAL | 2 refills | 0 days | Status: CP
Start: 2017-06-05 — End: 2018-02-22

## 2017-06-29 ENCOUNTER — Encounter: Payer: Self-pay | Admitting: Medical Oncology

## 2017-06-29 ENCOUNTER — Emergency Department
Admission: EM | Admit: 2017-06-29 | Discharge: 2017-06-29 | Disposition: A | Discharge status: Home / Self Care | Payer: Medicare Other | Source: Home / Self Care | Attending: Emergency Medicine | Admitting: Emergency Medicine

## 2017-06-29 ENCOUNTER — Emergency Department: Payer: Medicare Other

## 2017-06-29 DIAGNOSIS — Z955 Presence of coronary angioplasty implant and graft: Secondary | ICD-10-CM | POA: Insufficient documentation

## 2017-06-29 DIAGNOSIS — I482 Chronic atrial fibrillation: Secondary | ICD-10-CM | POA: Diagnosis not present

## 2017-06-29 DIAGNOSIS — I10 Essential (primary) hypertension: Secondary | ICD-10-CM

## 2017-06-29 DIAGNOSIS — E119 Type 2 diabetes mellitus without complications: Secondary | ICD-10-CM

## 2017-06-29 DIAGNOSIS — I252 Old myocardial infarction: Secondary | ICD-10-CM | POA: Insufficient documentation

## 2017-06-29 DIAGNOSIS — E039 Hypothyroidism, unspecified: Secondary | ICD-10-CM | POA: Insufficient documentation

## 2017-06-29 DIAGNOSIS — Z8673 Personal history of transient ischemic attack (TIA), and cerebral infarction without residual deficits: Secondary | ICD-10-CM

## 2017-06-29 DIAGNOSIS — R110 Nausea: Secondary | ICD-10-CM

## 2017-06-29 DIAGNOSIS — R0600 Dyspnea, unspecified: Secondary | ICD-10-CM | POA: Insufficient documentation

## 2017-06-29 DIAGNOSIS — I4891 Unspecified atrial fibrillation: Secondary | ICD-10-CM | POA: Insufficient documentation

## 2017-06-29 DIAGNOSIS — E871 Hypo-osmolality and hyponatremia: Secondary | ICD-10-CM | POA: Diagnosis not present

## 2017-06-29 DIAGNOSIS — Z87891 Personal history of nicotine dependence: Secondary | ICD-10-CM

## 2017-06-29 LAB — CBC WITH DIFFERENTIAL/PLATELET
BASOS ABS: 0.1 10*3/uL (ref 0–0.1)
Basophils Relative: 1 %
EOS ABS: 0.2 10*3/uL (ref 0–0.7)
EOS PCT: 2 %
HCT: 39.6 % (ref 35.0–47.0)
Hemoglobin: 12.9 g/dL (ref 12.0–16.0)
Lymphocytes Relative: 5 %
Lymphs Abs: 0.5 10*3/uL — ABNORMAL LOW (ref 1.0–3.6)
MCH: 26.6 pg (ref 26.0–34.0)
MCHC: 32.6 g/dL (ref 32.0–36.0)
MCV: 81.5 fL (ref 80.0–100.0)
MONO ABS: 0.9 10*3/uL (ref 0.2–0.9)
MONOS PCT: 9 %
Neutro Abs: 8.6 10*3/uL — ABNORMAL HIGH (ref 1.4–6.5)
Neutrophils Relative %: 83 %
PLATELETS: 217 10*3/uL (ref 150–440)
RBC: 4.86 MIL/uL (ref 3.80–5.20)
RDW: 14.2 % (ref 11.5–14.5)
WBC: 10.3 10*3/uL (ref 3.6–11.0)

## 2017-06-29 LAB — TROPONIN I
TROPONIN I: 0.52 ng/mL — AB (ref <0.03)
Troponin I: 0.42 ng/mL (ref <0.03)

## 2017-06-29 LAB — T4, FREE: Free T4: 0.95 ng/dL (ref 0.61–1.12)

## 2017-06-29 LAB — BRAIN NATRIURETIC PEPTIDE: B Natriuretic Peptide: 412 pg/mL — ABNORMAL HIGH (ref 0.0–100.0)

## 2017-06-29 LAB — BASIC METABOLIC PANEL
ANION GAP: 12 (ref 5–15)
BUN: 28 mg/dL — ABNORMAL HIGH (ref 6–20)
CALCIUM: 8.7 mg/dL — AB (ref 8.9–10.3)
CO2: 20 mmol/L — AB (ref 22–32)
CREATININE: 1.24 mg/dL — AB (ref 0.44–1.00)
Chloride: 97 mmol/L — ABNORMAL LOW (ref 101–111)
GFR, EST AFRICAN AMERICAN: 44 mL/min — AB (ref >60)
GFR, EST NON AFRICAN AMERICAN: 38 mL/min — AB (ref >60)
Glucose, Bld: 294 mg/dL — ABNORMAL HIGH (ref 65–99)
Potassium: 4 mmol/L (ref 3.5–5.1)
SODIUM: 129 mmol/L — AB (ref 135–145)

## 2017-06-29 LAB — TSH: TSH: 3.267 u[IU]/mL (ref 0.350–4.500)

## 2017-06-29 MED ORDER — SODIUM CHLORIDE 0.9 % IV SOLN
Freq: Once | INTRAVENOUS | Status: AC
  Administered 2017-06-29: 13:00:00 via INTRAVENOUS

## 2017-06-29 MED ORDER — METOPROLOL TARTRATE 50 MG PO TABS
50.0000 mg | ORAL_TABLET | Freq: Once | ORAL | Status: AC
  Administered 2017-06-29: 50 mg via ORAL
  Filled 2017-06-29: qty 1

## 2017-06-29 MED ORDER — ONDANSETRON 4 MG PO TBDP: 4.0000 mg | ORAL_TABLET | Freq: Three times a day (TID) | ORAL | 0 refills | Status: DC | PRN

## 2017-06-29 MED ORDER — METOPROLOL TARTRATE 5 MG/5ML IV SOLN
5.0000 mg | Freq: Once | INTRAVENOUS | Status: AC
  Administered 2017-06-29: 5 mg via INTRAVENOUS
  Filled 2017-06-29: qty 5

## 2017-06-29 MED ORDER — ONDANSETRON HCL 4 MG/2ML IJ SOLN
4.0000 mg | Freq: Once | INTRAMUSCULAR | Status: AC
  Administered 2017-06-29: 4 mg via INTRAVENOUS
  Filled 2017-06-29: qty 2

## 2017-06-29 NOTE — ED Notes (Signed)
Pt given meal tray by this RN. Pt is alert and oriented x4 and denies having nausea at this time. Pt eating independently and in NAD. Family at bedside.

## 2017-06-29 NOTE — ED Provider Notes (Signed)
[facility] Emergency Department Provider Note       Time seen: ----------------------------------------- 12:37 PM on 06/29/2017 -----------------------------------------   I have reviewed the triage vital signs and the nursing notes.  HISTORY   Chief Complaint Shortness of Breath and Atrial Fibrillation    HPI Peggy [name] is a 84 y.o. female with a history of arthritis, diabetes, GERD, hyperlipidemia, hypertension and TIA who presents to the ED for atrial fibrillation with a rapid ventricular response.  Patient reports she has been short of breath and sick for several days.  She is felt nauseous and was not sure she can keep her medicines down.  Patient did not take any medicines today but took them 2 days ago.  She denies fevers or chills.  She was sent from [facility] for further evaluation.  Past Medical History:  Diagnosis Date  . A-fib (HCC)   . Allergy   . Arthritis   . Diabetes mellitus without complication (HCC)   . GERD (gastroesophageal reflux disease)   . Hyperlipidemia   . Hypertension   . TIA (transient ischemic attack)     Patient Active Problem List   Diagnosis Date Noted  . TIA (transient ischemic attack) 12/10/2016  . CVA (cerebral vascular accident) (HCC) 12/09/2016  . Myocardial infarction (HCC) 07/30/2015  . Urinary frequency 02/09/2015  . RUQ abdominal pain 01/26/2015  . Shortness of breath 01/26/2015  . Anxiety 01/26/2015  . Chronic pruritus 12/21/2014  . Cerebral vascular accident (HCC) 11/16/2014  . Chronic low back pain 11/16/2014  . Adult hypothyroidism 11/15/2014  . Allergic rhinitis 11/15/2014  . Body mass index (BMI) of 28.0-28.9 in adult 11/15/2014  . Arthritis 11/15/2014  . Cramps of lower extremity 11/15/2014  . Essential (primary) hypertension 11/15/2014  . Acid reflux 11/15/2014  . Hypercholesteremia 11/15/2014  . Cannot sleep 11/15/2014  . Psoriasis 11/15/2014  . Itch of skin 11/15/2014  .  Restless leg 11/15/2014  . Diabetes mellitus, type 2 (HCC) 11/15/2014  . Breath shortness 11/15/2014  . Arteriosclerosis of coronary artery 02/20/2014  . AF (paroxysmal atrial fibrillation) (HCC) 02/20/2014  . Temporary cerebral vascular dysfunction 02/20/2014  . DD (diverticular disease) 10/05/2013    Past Surgical History:  Procedure Laterality Date  . ABDOMINAL HYSTERECTOMY  1970  . BACK SURGERY  2013  . colectomy Right 10/08/2013   Dr. Egbert Garibaldi  . CORONARY ANGIOPLASTY WITH STENT PLACEMENT      Allergies Diphenhydramine; Metformin; Oxybutynin; Amlodipine; Ciprofloxacin; Lisinopril; Penicillins; and Saxagliptin  Social History Social History   Tobacco Use  . Smoking status: Former Smoker    Packs/day: 1.00    Years: 30.00    Pack years: 30.00    Last attempt to quit: 11/30/1999    Years since quitting: 17.5  . Smokeless tobacco: Never Used  Substance Use Topics  . Alcohol use: No  . Drug use: No    Review of Systems Constitutional: Negative for fever. Cardiovascular: Negative for chest pain. Respiratory: Positive for shortness of breath Gastrointestinal: Positive for nausea Musculoskeletal: Negative for back pain. Skin: Negative for rash. Neurological: Negative for headaches, focal weakness or numbness.  All systems negative/normal/unremarkable except as stated in the HPI  ____________________________________________   PHYSICAL EXAM:  VITAL SIGNS: ED Triage Vitals [06/29/17 1216]  Enc Vitals Group     BP 127/77     Pulse Rate 73     Resp 18     Temp 98.2 F (36.8 C)     Temp Source Oral  SpO2 97 %     Weight 155 lb (70.3 kg)     Height      Head Circumference      Peak Flow      Pain Score      Pain Loc      Pain Edu?      Excl. in GC?    Constitutional: Alert and oriented. Well appearing and in no distress. Eyes: Conjunctivae are normal. Normal extraocular movements. ENT   Head: Normocephalic and atraumatic.   Nose: No  congestion/rhinnorhea.   Mouth/Throat: Mucous membranes are moist.   Neck: No stridor. Cardiovascular: Rapid rate, irregular rhythm. No murmurs, rubs, or gallops. Respiratory: Normal respiratory effort without tachypnea nor retractions. Breath sounds are clear and equal bilaterally. No wheezes/rales/rhonchi. Gastrointestinal: Soft and nontender. Normal bowel sounds Musculoskeletal: Nontender with normal range of motion in extremities. No lower extremity tenderness nor edema. Neurologic:  Normal speech and language. No gross focal neurologic deficits are appreciated.  Skin:  Skin is warm, dry and intact. No rash noted. Psychiatric: Mood and affect are normal. Speech and behavior are normal.  ____________________________________________  EKG: Interpreted by me.  Atrial fibrillation with a rapid ventricular response, rate is 167 bpm, normal QRS size, normal QT.  Nonspecific ST segment changes  ____________________________________________  ED COURSE:  As part of my medical decision making, I reviewed the following data within the electronic MEDICAL RECORD NUMBER History obtained from family if available, nursing notes, old chart and ekg, as well as notes from prior ED visits. Patient presented for recent sickness and presents with rapid A. fib, we will assess with labs and imaging as indicated at this time.  She will require IV Cardizem and we will restart her typical medications. Clinical Course as of Jun 29 1498  Mon Jun 29, 2017  1336 Patient has converted to a normal sinus rhythm and currently states she feels better  [JW]    Clinical Course User Index [JW] Emily Filbert, MD   Procedures ____________________________________________   LABS (pertinent positives/negatives)  Labs Reviewed  CBC WITH DIFFERENTIAL/PLATELET - Abnormal; Notable for the following components:      Result Value   Neutro Abs 8.6 (*)    Lymphs Abs 0.5 (*)    All other components within normal limits   BASIC METABOLIC PANEL - Abnormal; Notable for the following components:   Sodium 129 (*)    Chloride 97 (*)    CO2 20 (*)    Glucose, Bld 294 (*)    BUN 28 (*)    Creatinine, Ser 1.24 (*)    Calcium 8.7 (*)    GFR calc non Af Amer 38 (*)    GFR calc Af Amer 44 (*)    All other components within normal limits  BRAIN NATRIURETIC PEPTIDE - Abnormal; Notable for the following components:   B Natriuretic Peptide 412.0 (*)    All other components within normal limits  TROPONIN I - Abnormal; Notable for the following components:   Troponin I 0.52 (*)    All other components within normal limits  TROPONIN I - Abnormal; Notable for the following components:   Troponin I 0.42 (*)    All other components within normal limits  TSH  T4, FREE    RADIOLOGY Images were viewed by me  Chest x-ray  IMPRESSION: 1. Low lung volumes and mild pulmonary vascular congestion. 2. Aortic Atherosclerosis (ICD10-I70.0). ____________________________________________  DIFFERENTIAL DIAGNOSIS   Atrial fibrillation, hyperthyroidism, occult infection, pneumonia, URI, sepsis, unstable  angina  FINAL ASSESSMENT AND PLAN  Atrial fibrillation with a rapid ventricular response, chronically elevated troponin   Plan: Patient had presented for viral symptoms and was noted to be markedly tachycardic. Patient's labs were concerning and that the troponin was elevated but it is elevated chronically.  Previous troponin was higher than today.  And the troponin is trending downward.  There could be a demand component to this but it is decreasing and patient does not want to stay in the hospital.  She has converted to a normal sinus rhythm so I do feel comfortable letting her go home as long as she is taking her medicines.  We will discharge her with symptomatic treatment. Patient's imaging today is unremarkable   Emily Filbert, MD   Note: This note was generated in part or whole with voice recognition  software. Voice recognition is usually quite accurate but there are transcription errors that can and very often do occur. I apologize for any typographical errors that were not detected and corrected.     Emily Filbert, MD 06/29/17 1501

## 2017-06-29 NOTE — ED Triage Notes (Signed)
Pt sent from [facility] with afib RVR. Pt reports she has been sob for a few days. Pt denies pain.

## 2017-06-29 NOTE — ED Triage Notes (Signed)
FIRST NURSE NOTE-from [facility] for afib RVR with SHOB.  Hx afib. Rate 160s on their EKG.  Unlabored at check in.  Pulled next.

## 2017-06-30 ENCOUNTER — Other Ambulatory Visit: Payer: Self-pay

## 2017-06-30 ENCOUNTER — Inpatient Hospital Stay
Admission: EM | Admit: 2017-06-30 | Discharge: 2017-07-04 | DRG: 308 | Disposition: A | Discharge status: Home / Self Care | Payer: Medicare Other | Attending: Family Medicine | Admitting: Family Medicine

## 2017-06-30 ENCOUNTER — Encounter: Payer: Self-pay | Admitting: Emergency Medicine

## 2017-06-30 DIAGNOSIS — Z880 Allergy status to penicillin: Secondary | ICD-10-CM | POA: Diagnosis not present

## 2017-06-30 DIAGNOSIS — R739 Hyperglycemia, unspecified: Secondary | ICD-10-CM

## 2017-06-30 DIAGNOSIS — Z801 Family history of malignant neoplasm of trachea, bronchus and lung: Secondary | ICD-10-CM

## 2017-06-30 DIAGNOSIS — E039 Hypothyroidism, unspecified: Secondary | ICD-10-CM | POA: Diagnosis present

## 2017-06-30 DIAGNOSIS — I482 Chronic atrial fibrillation: Principal | ICD-10-CM | POA: Diagnosis present

## 2017-06-30 DIAGNOSIS — E871 Hypo-osmolality and hyponatremia: Secondary | ICD-10-CM | POA: Diagnosis present

## 2017-06-30 DIAGNOSIS — I110 Hypertensive heart disease with heart failure: Secondary | ICD-10-CM | POA: Diagnosis present

## 2017-06-30 DIAGNOSIS — I4891 Unspecified atrial fibrillation: Secondary | ICD-10-CM

## 2017-06-30 DIAGNOSIS — Z803 Family history of malignant neoplasm of breast: Secondary | ICD-10-CM | POA: Diagnosis not present

## 2017-06-30 DIAGNOSIS — I248 Other forms of acute ischemic heart disease: Secondary | ICD-10-CM | POA: Diagnosis present

## 2017-06-30 DIAGNOSIS — R0989 Other specified symptoms and signs involving the circulatory and respiratory systems: Secondary | ICD-10-CM | POA: Diagnosis present

## 2017-06-30 DIAGNOSIS — Z8673 Personal history of transient ischemic attack (TIA), and cerebral infarction without residual deficits: Secondary | ICD-10-CM

## 2017-06-30 DIAGNOSIS — Z794 Long term (current) use of insulin: Secondary | ICD-10-CM

## 2017-06-30 DIAGNOSIS — Z7982 Long term (current) use of aspirin: Secondary | ICD-10-CM

## 2017-06-30 DIAGNOSIS — Z87891 Personal history of nicotine dependence: Secondary | ICD-10-CM | POA: Diagnosis not present

## 2017-06-30 DIAGNOSIS — E1165 Type 2 diabetes mellitus with hyperglycemia: Secondary | ICD-10-CM | POA: Diagnosis present

## 2017-06-30 DIAGNOSIS — I5033 Acute on chronic diastolic (congestive) heart failure: Secondary | ICD-10-CM | POA: Diagnosis present

## 2017-06-30 DIAGNOSIS — Z888 Allergy status to other drugs, medicaments and biological substances status: Secondary | ICD-10-CM | POA: Diagnosis not present

## 2017-06-30 DIAGNOSIS — Z7902 Long term (current) use of antithrombotics/antiplatelets: Secondary | ICD-10-CM

## 2017-06-30 DIAGNOSIS — Z808 Family history of malignant neoplasm of other organs or systems: Secondary | ICD-10-CM

## 2017-06-30 DIAGNOSIS — Z79890 Hormone replacement therapy (postmenopausal): Secondary | ICD-10-CM | POA: Diagnosis not present

## 2017-06-30 DIAGNOSIS — E782 Mixed hyperlipidemia: Secondary | ICD-10-CM | POA: Diagnosis present

## 2017-06-30 DIAGNOSIS — R040 Epistaxis: Secondary | ICD-10-CM | POA: Diagnosis present

## 2017-06-30 DIAGNOSIS — Z955 Presence of coronary angioplasty implant and graft: Secondary | ICD-10-CM | POA: Diagnosis not present

## 2017-06-30 LAB — BASIC METABOLIC PANEL
ANION GAP: 9 (ref 5–15)
BUN: 25 mg/dL — ABNORMAL HIGH (ref 6–20)
CHLORIDE: 100 mmol/L — AB (ref 101–111)
CO2: 21 mmol/L — AB (ref 22–32)
Calcium: 8.6 mg/dL — ABNORMAL LOW (ref 8.9–10.3)
Creatinine, Ser: 1.23 mg/dL — ABNORMAL HIGH (ref 0.44–1.00)
GFR calc non Af Amer: 39 mL/min — ABNORMAL LOW (ref >60)
GFR, EST AFRICAN AMERICAN: 45 mL/min — AB (ref >60)
Glucose, Bld: 321 mg/dL — ABNORMAL HIGH (ref 65–99)
Potassium: 3.9 mmol/L (ref 3.5–5.1)
Sodium: 130 mmol/L — ABNORMAL LOW (ref 135–145)

## 2017-06-30 LAB — HEPATIC FUNCTION PANEL
ALT: 50 U/L (ref 14–54)
AST: 82 U/L — AB (ref 15–41)
Albumin: 3.1 g/dL — ABNORMAL LOW (ref 3.5–5.0)
Alkaline Phosphatase: 43 U/L (ref 38–126)
BILIRUBIN DIRECT: 0.3 mg/dL (ref 0.1–0.5)
BILIRUBIN TOTAL: 0.8 mg/dL (ref 0.3–1.2)
Indirect Bilirubin: 0.5 mg/dL (ref 0.3–0.9)
Total Protein: 6.3 g/dL — ABNORMAL LOW (ref 6.5–8.1)

## 2017-06-30 LAB — CBC
HEMATOCRIT: 36.8 % (ref 35.0–47.0)
HEMOGLOBIN: 12 g/dL (ref 12.0–16.0)
MCH: 26.5 pg (ref 26.0–34.0)
MCHC: 32.6 g/dL (ref 32.0–36.0)
MCV: 81.3 fL (ref 80.0–100.0)
Platelets: 245 10*3/uL (ref 150–440)
RBC: 4.53 MIL/uL (ref 3.80–5.20)
RDW: 14.4 % (ref 11.5–14.5)
WBC: 11.4 10*3/uL — ABNORMAL HIGH (ref 3.6–11.0)

## 2017-06-30 LAB — GLUCOSE, CAPILLARY
Glucose-Capillary: 221 mg/dL — ABNORMAL HIGH (ref 65–99)
Glucose-Capillary: 269 mg/dL — ABNORMAL HIGH (ref 65–99)
Glucose-Capillary: 303 mg/dL — ABNORMAL HIGH (ref 65–99)

## 2017-06-30 LAB — TROPONIN I: Troponin I: 0.42 ng/mL (ref <0.03)

## 2017-06-30 LAB — PROTIME-INR
INR: 1.32
Prothrombin Time: 16.3 seconds — ABNORMAL HIGH (ref 11.4–15.2)

## 2017-06-30 LAB — APTT: APTT: 34 s (ref 24–36)

## 2017-06-30 MED ORDER — ONDANSETRON 4 MG PO TBDP
4.0000 mg | ORAL_TABLET | Freq: Three times a day (TID) | ORAL | Status: DC | PRN
  Administered 2017-06-30: 4 mg via ORAL
  Filled 2017-06-30 (×2): qty 1

## 2017-06-30 MED ORDER — ALBUTEROL SULFATE (2.5 MG/3ML) 0.083% IN NEBU: 2.5000 mg | INHALATION_SOLUTION | Freq: Four times a day (QID) | RESPIRATORY_TRACT | Status: DC | PRN

## 2017-06-30 MED ORDER — AMIODARONE HCL IN DEXTROSE 360-4.14 MG/200ML-% IV SOLN
60.0000 mg/h | INTRAVENOUS | Status: AC
  Administered 2017-06-30: 60 mg/h via INTRAVENOUS
  Filled 2017-06-30: qty 200

## 2017-06-30 MED ORDER — ACETAMINOPHEN 325 MG PO TABS: 650.0000 mg | ORAL_TABLET | Freq: Four times a day (QID) | ORAL | Status: DC | PRN

## 2017-06-30 MED ORDER — METOPROLOL SUCCINATE ER 50 MG PO TB24: 50.0000 mg | ORAL_TABLET | Freq: Every day | ORAL | Status: DC

## 2017-06-30 MED ORDER — FLUTICASONE PROPIONATE 50 MCG/ACT NA SUSP
1.0000 | Freq: Every day | NASAL | Status: DC
  Administered 2017-07-01 – 2017-07-04 (×3): 1 via NASAL
  Filled 2017-06-30: qty 16

## 2017-06-30 MED ORDER — CLOPIDOGREL BISULFATE 75 MG PO TABS
75.0000 mg | ORAL_TABLET | Freq: Every day | ORAL | Status: DC
  Administered 2017-07-01 – 2017-07-02 (×2): 75 mg via ORAL
  Filled 2017-06-30 (×2): qty 1

## 2017-06-30 MED ORDER — ATORVASTATIN CALCIUM 20 MG PO TABS
40.0000 mg | ORAL_TABLET | Freq: Every day | ORAL | Status: DC
  Administered 2017-07-01 – 2017-07-03 (×3): 40 mg via ORAL
  Filled 2017-06-30 (×3): qty 2

## 2017-06-30 MED ORDER — DILTIAZEM HCL 25 MG/5ML IV SOLN
10.0000 mg | INTRAVENOUS | Status: AC
  Administered 2017-06-30: 10 mg via INTRAVENOUS
  Filled 2017-06-30: qty 5

## 2017-06-30 MED ORDER — INSULIN ASPART 100 UNIT/ML ~~LOC~~ SOLN
SUBCUTANEOUS | Status: AC
  Filled 2017-06-30: qty 1

## 2017-06-30 MED ORDER — FENOFIBRATE 160 MG PO TABS
160.0000 mg | ORAL_TABLET | Freq: Every day | ORAL | Status: DC
  Administered 2017-07-01 – 2017-07-04 (×4): 160 mg via ORAL
  Filled 2017-06-30 (×4): qty 1

## 2017-06-30 MED ORDER — DILTIAZEM HCL 25 MG/5ML IV SOLN: 10.0000 mg | Freq: Once | INTRAVENOUS | Status: DC

## 2017-06-30 MED ORDER — MELATONIN 5 MG PO TABS
5.0000 mg | ORAL_TABLET | Freq: Every evening | ORAL | Status: DC | PRN
  Filled 2017-06-30: qty 1

## 2017-06-30 MED ORDER — ADULT MULTIVITAMIN W/MINERALS CH
1.0000 | ORAL_TABLET | Freq: Every day | ORAL | Status: DC
  Administered 2017-07-01 – 2017-07-04 (×4): 1 via ORAL
  Filled 2017-06-30 (×4): qty 1

## 2017-06-30 MED ORDER — INSULIN ASPART 100 UNIT/ML ~~LOC~~ SOLN
0.0000 [IU] | Freq: Every day | SUBCUTANEOUS | Status: DC
  Administered 2017-06-30 – 2017-07-02 (×3): 2 [IU] via SUBCUTANEOUS
  Filled 2017-06-30 (×4): qty 1

## 2017-06-30 MED ORDER — INSULIN ASPART PROT & ASPART (70-30 MIX) 100 UNIT/ML ~~LOC~~ SUSP
15.0000 [IU] | Freq: Two times a day (BID) | SUBCUTANEOUS | Status: DC
  Administered 2017-07-01 – 2017-07-02 (×4): 15 [IU] via SUBCUTANEOUS
  Filled 2017-06-30 (×4): qty 1

## 2017-06-30 MED ORDER — METOPROLOL TARTRATE 5 MG/5ML IV SOLN
5.0000 mg | Freq: Once | INTRAVENOUS | Status: AC
  Administered 2017-06-30: 5 mg via INTRAVENOUS
  Filled 2017-06-30: qty 5

## 2017-06-30 MED ORDER — AMIODARONE HCL IN DEXTROSE 360-4.14 MG/200ML-% IV SOLN
30.0000 mg/h | INTRAVENOUS | Status: DC
  Administered 2017-06-30 (×2): 30 mg/h via INTRAVENOUS
  Filled 2017-06-30: qty 200

## 2017-06-30 MED ORDER — DOCUSATE SODIUM 100 MG PO CAPS
100.0000 mg | ORAL_CAPSULE | Freq: Two times a day (BID) | ORAL | Status: DC | PRN
  Administered 2017-07-03: 100 mg via ORAL
  Filled 2017-06-30: qty 1

## 2017-06-30 MED ORDER — ACETAMINOPHEN ER 650 MG PO TBCR: 650.0000 mg | EXTENDED_RELEASE_TABLET | Freq: Three times a day (TID) | ORAL | Status: DC | PRN

## 2017-06-30 MED ORDER — SODIUM CHLORIDE 0.9 % IV BOLUS (SEPSIS)
500.0000 mL | INTRAVENOUS | Status: AC
  Administered 2017-06-30: 500 mL via INTRAVENOUS

## 2017-06-30 MED ORDER — LEVOTHYROXINE SODIUM 50 MCG PO TABS
50.0000 ug | ORAL_TABLET | Freq: Every day | ORAL | Status: DC
  Administered 2017-07-01 – 2017-07-04 (×4): 50 ug via ORAL
  Filled 2017-06-30 (×4): qty 1

## 2017-06-30 MED ORDER — INSULIN ASPART 100 UNIT/ML ~~LOC~~ SOLN
0.0000 [IU] | Freq: Three times a day (TID) | SUBCUTANEOUS | Status: DC
  Administered 2017-06-30: 5 [IU] via SUBCUTANEOUS
  Administered 2017-07-01: 3 [IU] via SUBCUTANEOUS
  Administered 2017-07-01: 2 [IU] via SUBCUTANEOUS
  Administered 2017-07-01: 5 [IU] via SUBCUTANEOUS
  Administered 2017-07-02 (×2): 3 [IU] via SUBCUTANEOUS
  Administered 2017-07-02 – 2017-07-03 (×2): 2 [IU] via SUBCUTANEOUS
  Administered 2017-07-03 – 2017-07-04 (×3): 3 [IU] via SUBCUTANEOUS
  Filled 2017-06-30 (×10): qty 1

## 2017-06-30 MED ORDER — ASPIRIN EC 81 MG PO TBEC
81.0000 mg | DELAYED_RELEASE_TABLET | Freq: Every day | ORAL | Status: DC
  Administered 2017-07-02 – 2017-07-03 (×2): 81 mg via ORAL
  Filled 2017-06-30 (×4): qty 1

## 2017-06-30 MED ORDER — HEPARIN SODIUM (PORCINE) 5000 UNIT/ML IJ SOLN
5000.0000 [IU] | Freq: Three times a day (TID) | INTRAMUSCULAR | Status: DC
  Administered 2017-06-30 – 2017-07-03 (×9): 5000 [IU] via SUBCUTANEOUS
  Filled 2017-06-30 (×7): qty 1

## 2017-06-30 NOTE — ED Provider Notes (Addendum)
[facility] Emergency Department Provider Note  ____________________________________________   First MD Initiated Contact with Patient 06/30/17 1417     (approximate)  I have reviewed the triage vital signs and the nursing notes.   HISTORY  Chief Complaint Shortness of Breath and Palpitations    HPI Peggy [name] is a 84 y.o. female with medical history as listed below who presents for evaluation of persistent shortness of breath and palpitations in the setting of a-fib w/ RVR.  She was seen yesterday with A. fib with RVR in the setting of chronic A. fib on Plavix.  She was treated with a dose of diltiazem 10 mg IV to which she responded well and then she took her regular oral medications and went home with a normal rate feeling much better.  She reports that she woke up during the night with the same symptoms which are shortness of breath and occasional feelings of palpitations.  Her symptoms are moderate and exertion makes them worse, nothing makes them better.  She denies chest pain.  She has had no recent fever or chills, nausea, vomiting, nor abdominal pain.  Past Medical History:  Diagnosis Date  . A-fib (HCC)   . Allergy   . Arthritis   . Diabetes mellitus without complication (HCC)   . GERD (gastroesophageal reflux disease)   . Hyperlipidemia   . Hypertension   . TIA (transient ischemic attack)     Patient Active Problem List   Diagnosis Date Noted  . TIA (transient ischemic attack) 12/10/2016  . CVA (cerebral vascular accident) (HCC) 12/09/2016  . Myocardial infarction (HCC) 07/30/2015  . Urinary frequency 02/09/2015  . RUQ abdominal pain 01/26/2015  . Shortness of breath 01/26/2015  . Anxiety 01/26/2015  . Chronic pruritus 12/21/2014  . Cerebral vascular accident (HCC) 11/16/2014  . Chronic low back pain 11/16/2014  . Adult hypothyroidism 11/15/2014  . Allergic rhinitis 11/15/2014  . Body mass index (BMI) of 28.0-28.9 in adult 11/15/2014   . Arthritis 11/15/2014  . Cramps of lower extremity 11/15/2014  . Essential (primary) hypertension 11/15/2014  . Acid reflux 11/15/2014  . Hypercholesteremia 11/15/2014  . Cannot sleep 11/15/2014  . Psoriasis 11/15/2014  . Itch of skin 11/15/2014  . Restless leg 11/15/2014  . Diabetes mellitus, type 2 (HCC) 11/15/2014  . Breath shortness 11/15/2014  . Arteriosclerosis of coronary artery 02/20/2014  . AF (paroxysmal atrial fibrillation) (HCC) 02/20/2014  . Temporary cerebral vascular dysfunction 02/20/2014  . DD (diverticular disease) 10/05/2013    Past Surgical History:  Procedure Laterality Date  . ABDOMINAL HYSTERECTOMY  1970  . BACK SURGERY  2013  . colectomy Right 10/08/2013   Dr. Egbert Garibaldi  . CORONARY ANGIOPLASTY WITH STENT PLACEMENT      Prior to Admission medications   Medication Sig Start Date End Date Taking? Authorizing Provider  acetaminophen (TYLENOL) 650 MG CR tablet Take 650 mg by mouth every 8 (eight) hours as needed for pain.    [provider]  aspirin 81 MG tablet Take 81 mg by mouth daily.     [provider]  atorvastatin (LIPITOR) 40 MG tablet Take 1 tablet (40 mg total) by mouth daily at 6 PM. 12/10/16   Alford Highland, MD  clopidogrel (PLAVIX) 75 MG tablet Take 1 tablet (75 mg total) by mouth daily. 12/10/16   Alford Highland, MD  fenofibrate (TRICOR) 145 MG tablet Take 1 tablet (145 mg total) by mouth daily. 09/10/15   Lorie Phenix, MD  fluocinonide ointment (LIDEX)  0.05 % Apply 1 application topically daily as needed. 11/17/16   [provider]  fluticasone [name]) 50 MCG/ACT nasal spray USE ONE SPRAY IN EACH NOSTRIL DAILY 09/27/15   Lorie Phenix, MD  HYDROcodone-acetaminophen (NORCO/VICODIN) 5-325 MG tablet Take 1 tablet by mouth 2 (two) times daily. As needed for chronic low back pain 07/18/15   Lorie Phenix, MD  insulin NPH-regular Human (NOVOLIN 70/30) (70-30) 100 UNIT/ML injection Inject 18 Units into the skin 2 (two) times  daily with a meal. 12/10/16   Alford Highland, MD  levothyroxine (SYNTHROID, LEVOTHROID) 50 MCG tablet Take 1 tablet (50 mcg total) by mouth daily. PATIENT NEEDS TO SCHEDULE OFFICE VISIT FOR FOLLOW UP 02/01/16   Malva Limes, MD  Melatonin 1 MG TABS Take 1 tablet by mouth at bedtime as needed. Enhanced Sleep with melatonin. Active ingredient Sendara 200mg  and 1.5 of melatonin    [provider]  metoprolol succinate (TOPROL-XL) 50 MG 24 hr tablet TAKE 0.5 TABLET BY MOUTH EVERY DAY 12/10/16   Alford Highland, MD  Multiple Vitamin (MULTIVITAMIN) tablet Take 1 tablet by mouth daily.    [provider]  ondansetron (ZOFRAN ODT) 4 MG disintegrating tablet Take 1 tablet (4 mg total) by mouth every 8 (eight) hours as needed for nausea or vomiting. 06/29/17   Emily Filbert, MD  Potassium 99 MG TABS Take 1 tablet by mouth daily.    [provider]  rOPINIRole (REQUIP) 0.25 MG tablet Take 1 tablet (0.25 mg total) by mouth at bedtime. 12/10/16 12/10/17  Alford Highland, MD    Allergies Diphenhydramine; Metformin; Oxybutynin; Amlodipine; Ciprofloxacin; Lisinopril; Penicillins; and Saxagliptin  Family History  Problem Relation Age of Onset  . Breast cancer Mother   . Pancreatitis Father   . Breast cancer Sister   . Lung cancer Brother   . Melanoma Brother   . Throat cancer Brother     Social History Social History   Tobacco Use  . Smoking status: Former Smoker    Packs/day: 1.00    Years: 30.00    Pack years: 30.00    Last attempt to quit: 11/30/1999    Years since quitting: 17.5  . Smokeless tobacco: Never Used  Substance Use Topics  . Alcohol use: No  . Drug use: No    Review of Systems Constitutional: No fever/chills Eyes: No visual changes. ENT: No sore throat. Cardiovascular: Denies chest pain.  Occasional palpitations Respiratory: +shortness of breath. Gastrointestinal: No abdominal pain.  No nausea, no vomiting.  No diarrhea.  No  constipation. Genitourinary: Negative for dysuria. Musculoskeletal: Negative for neck pain.  Negative for back pain. Integumentary: Negative for rash. Neurological: Negative for headaches, focal weakness or numbness.   ____________________________________________   PHYSICAL EXAM:  VITAL SIGNS: ED Triage Vitals  Enc Vitals Group     BP 06/30/17 1221 133/71     Pulse Rate 06/30/17 1221 (!) 135     Resp 06/30/17 1221 (!) 36     Temp 06/30/17 1221 98.5 F (36.9 C)     Temp Source 06/30/17 1221 Oral     SpO2 06/30/17 1221 95 %     Weight 06/30/17 1223 68.9 kg (152 lb)     Height 06/30/17 1223 1.575 m (5\' 2" )     Head Circumference --      Peak Flow --      Pain Score 06/30/17 1226 0     Pain Loc --      Pain Edu? --  Excl. in GC? --     Constitutional: Alert and oriented.  Elderly, no acute distress, but does appear somewhat anxious Eyes: Conjunctivae are normal.  Head: Atraumatic. Nose: No congestion/rhinnorhea. Mouth/Throat: Mucous membranes are somewhat dry Neck: No stridor.  No meningeal signs.   Cardiovascular: Tachycardia with irregularly irregular rhythm. Good peripheral circulation. Grossly normal heart sounds. Respiratory: Normal respiratory effort.  No retractions. Lungs CTAB. Gastrointestinal: Soft and nontender. No distention.  Musculoskeletal: No lower extremity tenderness nor edema. No gross deformities of extremities. Neurologic:  Normal speech and language. No gross focal neurologic deficits are appreciated.  Skin:  Skin is warm, dry and intact. No rash noted. Psychiatric: Mood and affect are normal. Speech and behavior are normal.  ____________________________________________   LABS (all labs ordered are listed, but only abnormal results are displayed)  Labs Reviewed  BASIC METABOLIC PANEL - Abnormal; Notable for the following components:      Result Value   Sodium 130 (*)    Chloride 100 (*)    CO2 21 (*)    Glucose, Bld 321 (*)    BUN 25 (*)     Creatinine, Ser 1.23 (*)    Calcium 8.6 (*)    GFR calc non Af Amer 39 (*)    GFR calc Af Amer 45 (*)    All other components within normal limits  CBC - Abnormal; Notable for the following components:   WBC 11.4 (*)    All other components within normal limits  TROPONIN I - Abnormal; Notable for the following components:   Troponin I 0.42 (*)    All other components within normal limits  HEPATIC FUNCTION PANEL - Abnormal; Notable for the following components:   Total Protein 6.3 (*)    Albumin 3.1 (*)    AST 82 (*)    All other components within normal limits  PROTIME-INR - Abnormal; Notable for the following components:   Prothrombin Time 16.3 (*)    All other components within normal limits  APTT   ____________________________________________  EKG  ED ECG REPORT I, Loleta Rose, the attending physician, personally viewed and interpreted this ECG.  Date: 06/30/2017 EKG Time: 12:17 PM Rate: 160 Rhythm: a-fib w/ RVR QRS Axis: normal Intervals: normal ST/T Wave abnormalities: Non-specific ST segment / T-wave changes, but no evidence of acute ischemia. Narrative Interpretation: no evidence of acute ischemia  ____________________________________________  RADIOLOGY   ED MD interpretation:  no imaging ordered  Official radiology report(s): No results found.  ____________________________________________   PROCEDURES  Critical Care performed: Yes, see critical care procedure note(s)   Procedure(s) performed:   .Critical Care Performed by: Loleta Rose, MD Authorized by: Loleta Rose, MD   Critical care provider statement:    Critical care time (minutes):  45   Critical care time was exclusive of:  Separately billable procedures and treating other patients   Critical care was necessary to treat or prevent imminent or life-threatening deterioration of the following conditions:  Circulatory failure and cardiac failure   Critical care was time spent  personally by me on the following activities:  Development of treatment plan with patient or surrogate, discussions with consultants, evaluation of patient's response to treatment, examination of patient, obtaining history from patient or surrogate, ordering and performing treatments and interventions, ordering and review of laboratory studies, ordering and review of radiographic studies, pulse oximetry, re-evaluation of patient's condition and review of old charts     ____________________________________________   INITIAL IMPRESSION / ASSESSMENT AND PLAN / ED  COURSE  As part of my medical decision making, I reviewed the following data within the electronic MEDICAL RECORD NUMBER Nursing notes reviewed and incorporated, Labs reviewed , EKG interpreted  and Notes from prior ED visits    Differential diagnosis includes, but is not limited to, refractory A. fib with RVR, acute infection, ACS, etc.  The patient is not in any significant amount of distress but does feel somewhat uncomfortable.  Apparently her RVR came back last night during the night and has been constant since that time.  Her heart rate currently is between 140 and 160.  Given that she responded well to diltiazem yesterday, I will try another dose of diltiazem this time.  Her troponin is stable at 0.42 which is the same as it was yesterday.  We may need to admit her for refractory A. fib with RVR but I will see how she responds to treatment today.  Clinical Course as of Jul 01 1643  Tue Jun 30, 2017  1421 Stable troponin from yesterday Troponin I: (!!) 0.42 [CF]  1526 No improvement of HR after diltiazem 10 mg IV, which helped yesterday.  Patient is on metropolol at home, will try metroprolol 5 mg IV  [CF]  1641 Still tachycardic although her rate is improved somewhat.  I verified that she is maintained on Plavix and is not on any other anticoagulation due to a history of GI bleeding.  I called and spoke with Dr. Gwen Pounds who is covering  for [facility] clinic cardiology.  He recommended starting amiodarone infusion with no bolus and admitting her.  I have discussed with the hospitalist who will proceed.  I updated the patient and her family as well.  [CF]    Clinical Course User Index [CF] Loleta Rose, MD    ____________________________________________  FINAL CLINICAL IMPRESSION(S) / ED DIAGNOSES  Final diagnoses:  Atrial fibrillation with RVR (HCC)  Hyperglycemia  Hyponatremia  Demand ischemia (HCC)     MEDICATIONS GIVEN DURING THIS VISIT:  Medications  amiodarone (NEXTERONE PREMIX) 360-4.14 MG/200ML-% (1.8 mg/mL) IV infusion (not administered)  amiodarone (NEXTERONE PREMIX) 360-4.14 MG/200ML-% (1.8 mg/mL) IV infusion (not administered)  diltiazem (CARDIZEM) injection 10 mg (10 mg Intravenous Given 06/30/17 1501)  sodium chloride 0.9 % bolus 500 mL (500 mLs Intravenous New Bag/Given 06/30/17 1501)  metoprolol tartrate (LOPRESSOR) injection 5 mg (5 mg Intravenous Given 06/30/17 1618)     ED Discharge Orders    None       Note:  This document was prepared using Dragon voice recognition software and may include unintentional dictation errors.    Loleta Rose, MD 06/30/17 1644    Loleta Rose, MD 06/30/17 [phone]

## 2017-06-30 NOTE — ED Triage Notes (Signed)
C/O weakness, sob, palpitations.  Patient seen through ED yesterday for same complaint.

## 2017-06-30 NOTE — H&P (Signed)
[facility] NAME: Peggy [name]    MR#:  [id number]  DATE OF BIRTH:  Oct 05, 1928  DATE OF ADMISSION:  06/30/2017  PRIMARY CARE PHYSICIAN: Kirk Ruths, MD   REQUESTING/REFERRING PHYSICIAN: Marcos Eke  CHIEF COMPLAINT:   Chief Complaint  Patient presents with  . Shortness of Breath  . Palpitations    HISTORY OF PRESENT ILLNESS: Peggy [name]  is a 84 y.o. female with a known history of A. Fib, diabetes, hyperlipidemia, hypertension, TIA- last 3-4 days has repeated feeling of dizziness, palpitation and chest tightness. She came to emergency room yesterday, was noted to have A. Fib, TSH was normal, chest x-ray was negative and troponin was slightly elevated but stable, so sent home after starting on metoprolol. Today morning she started the same feeling again, so came back to emergency room. ER physician spoke to her primary cardiologist, suggested to admit her with amiodarone IV drip, ER physician has already ordered that. Patient was on Xarelto in the past for her A. Fib, but she had GI bleed and they had to stop that.  PAST MEDICAL HISTORY:   Past Medical History:  Diagnosis Date  . A-fib ([place])   . Allergy   . Arthritis   . Diabetes mellitus without complication ([place])   . GERD (gastroesophageal reflux disease)   . Hyperlipidemia   . Hypertension   . TIA (transient ischemic attack)     PAST SURGICAL HISTORY:  Past Surgical History:  Procedure Laterality Date  . ABDOMINAL HYSTERECTOMY  1970  . BACK SURGERY  2013  . colectomy Right 10/08/2013   Dr. Marina Gravel  . CORONARY ANGIOPLASTY WITH STENT PLACEMENT      SOCIAL HISTORY:  Social History   Tobacco Use  . Smoking status: Former Smoker    Packs/day: 1.00    Years: 30.00    Pack years: 30.00    Last attempt to quit: 11/30/1999    Years since quitting: 17.5  . Smokeless tobacco: Never Used  Substance Use Topics  . Alcohol use: No    FAMILY HISTORY:  Family History   Problem Relation Age of Onset  . Breast cancer Mother   . Pancreatitis Father   . Breast cancer Sister   . Lung cancer Brother   . Melanoma Brother   . Throat cancer Brother     DRUG ALLERGIES:  Allergies  Allergen Reactions  . Diphenhydramine Rash    AGITATION/DELIRIUM  . Metformin Diarrhea  . Oxybutynin Other (See Comments)    dizzy  . Amlodipine Rash  . Ciprofloxacin Rash  . Lisinopril Rash  . Penicillins Rash  . Saxagliptin Rash    REVIEW OF SYSTEMS:   CONSTITUTIONAL: No fever, fatigue or weakness.  EYES: No blurred or double vision.  EARS, NOSE, AND THROAT: No tinnitus or ear pain.  RESPIRATORY: No cough, shortness of breath, wheezing or hemoptysis.  CARDIOVASCULAR: No chest pain, orthopnea, edema. Have palpitation. GASTROINTESTINAL: No nausea, vomiting, diarrhea or abdominal pain.  GENITOURINARY: No dysuria, hematuria.  ENDOCRINE: No polyuria, nocturia,  HEMATOLOGY: No anemia, easy bruising or bleeding SKIN: No rash or lesion. MUSCULOSKELETAL: No joint pain or arthritis.   NEUROLOGIC: No tingling, numbness, weakness.  PSYCHIATRY: No anxiety or depression.   MEDICATIONS AT HOME:  Prior to Admission medications   Medication Sig Start Date End Date Taking? Authorizing Provider  acetaminophen (TYLENOL) 650 MG CR tablet Take 650 mg by mouth every 8 (eight) hours as needed for pain.  [provider]  amLODipine (NORVASC) 5 MG tablet Take 1 tablet by mouth daily. 04/14/17   [provider]  aspirin 81 MG tablet Take 81 mg by mouth daily.     [provider]  atorvastatin (LIPITOR) 40 MG tablet Take 1 tablet (40 mg total) by mouth daily at 6 PM. 12/10/16   Loletha Grayer, MD  clopidogrel (PLAVIX) 75 MG tablet Take 1 tablet (75 mg total) by mouth daily. 12/10/16   Loletha Grayer, MD  fenofibrate (TRICOR) 145 MG tablet Take 1 tablet (145 mg total) by mouth daily. 09/10/15   Margarita Rana, MD  fluocinonide ointment (LIDEX) 0.92 % Apply 1  application topically daily as needed. 11/17/16   [provider]  fluticasone Asencion Islam) 50 MCG/ACT nasal spray USE ONE SPRAY IN EACH NOSTRIL DAILY 09/27/15   Margarita Rana, MD  HYDROcodone-acetaminophen (NORCO/VICODIN) 5-325 MG tablet Take 1 tablet by mouth 2 (two) times daily. As needed for chronic low back pain 07/18/15   Margarita Rana, MD  insulin NPH-regular Human (NOVOLIN 70/30) (70-30) 100 UNIT/ML injection Inject 18 Units into the skin 2 (two) times daily with a meal. 12/10/16   Loletha Grayer, MD  levothyroxine (SYNTHROID, LEVOTHROID) 50 MCG tablet Take 1 tablet (50 mcg total) by mouth daily. PATIENT NEEDS TO SCHEDULE OFFICE VISIT FOR FOLLOW UP 02/01/16   Birdie Sons, MD  Melatonin 1 MG TABS Take 1 tablet by mouth at bedtime as needed. Enhanced Sleep with melatonin. Active ingredient [place] 247m and 1.5 of melatonin    [provider]  metoprolol succinate (TOPROL-XL) 50 MG 24 hr tablet TAKE 0.5 TABLET BY MOUTH EVERY DAY 12/10/16   WLoletha Grayer MD  Multiple Vitamin (MULTIVITAMIN) tablet Take 1 tablet by mouth daily.    [provider]  ondansetron (ZOFRAN ODT) 4 MG disintegrating tablet Take 1 tablet (4 mg total) by mouth every 8 (eight) hours as needed for nausea or vomiting. 06/29/17   WEarleen Newport MD  Potassium 99 MG TABS Take 1 tablet by mouth daily.    [provider]  rOPINIRole (REQUIP) 0.25 MG tablet Take 1 tablet (0.25 mg total) by mouth at bedtime. 12/10/16 12/10/17  WLoletha Grayer MD  VENTOLIN HFA 108 (90 Base) MCG/ACT inhaler Inhale 2 puffs into the lungs every 6 (six) hours as needed. 05/24/17   [provider]      PHYSICAL EXAMINATION:   VITAL SIGNS: Blood pressure 102/60, pulse (!) 135, temperature 98.5 F (36.9 C), temperature source Oral, resp. rate (!) 25, height _0  (1.575 m), weight 68.9 kg (152 lb), SpO2 98 %.  GENERAL:  84y.o.-year-old patient lying in the bed with no acute distress.  EYES: Pupils  equal, round, reactive to light and accommodation. No scleral icterus. Extraocular muscles intact.  HEENT: Head atraumatic, normocephalic. Oropharynx and nasopharynx clear.  NECK:  Supple, no jugular venous distention. No thyroid enlargement, no tenderness.  LUNGS: Normal breath sounds bilaterally, no wheezing, rales,rhonchi or crepitation. No use of accessory muscles of respiration.  CARDIOVASCULAR: S1, S2 fasting irregular. No murmurs, rubs, or gallops.  ABDOMEN: Soft, nontender, nondistended. Bowel sounds present. No organomegaly or mass.  EXTREMITIES: No pedal edema, cyanosis, or clubbing.  NEUROLOGIC: Cranial nerves II through XII are intact. Muscle strength 5/5 in all extremities. Sensation intact. Gait not checked.  PSYCHIATRIC: The patient is alert and oriented x 3.  SKIN: No obvious rash, lesion, or ulcer.   LABORATORY PANEL:   CBC Recent Labs  Lab 06/29/17 1224 06/30/17 1226  WBC 10.3 11.4*  HGB 12.9 12.0  HCT 39.6 36.8  PLT 217 245  MCV 81.5 81.3  MCH 26.6 26.5  MCHC 32.6 32.6  RDW 14.2 14.4  LYMPHSABS 0.5*  --   MONOABS 0.9  --   EOSABS 0.2  --   BASOSABS 0.1  --    ------------------------------------------------------------------------------------------------------------------  Chemistries  Recent Labs  Lab 06/29/17 1224 06/30/17 1226  NA 129* 130*  K 4.0 3.9  CL 97* 100*  CO2 20* 21*  GLUCOSE 294* 321*  BUN 28* 25*  CREATININE 1.24* 1.23*  CALCIUM 8.7* 8.6*  AST  --  82*  ALT  --  50  ALKPHOS  --  43  BILITOT  --  0.8   ------------------------------------------------------------------------------------------------------------------ estimated creatinine clearance is 29.9 mL/min (A) (by C-G formula based on SCr of 1.23 mg/dL (H)). ------------------------------------------------------------------------------------------------------------------ Recent Labs    06/29/17 1224  TSH 3.267     Coagulation profile Recent Labs  Lab 06/30/17 1226   INR 1.32   ------------------------------------------------------------------------------------------------------------------- No results for input(s): DDIMER in the last 72 hours. -------------------------------------------------------------------------------------------------------------------  Cardiac Enzymes Recent Labs  Lab 06/29/17 1224 06/29/17 1415 06/30/17 1226  TROPONINI 0.52* 0.42* 0.42*   ------------------------------------------------------------------------------------------------------------------ Invalid input(s): POCBNP  ---------------------------------------------------------------------------------------------------------------  Urinalysis    Component Value Date/Time   COLORURINE Yellow 08/11/2012 1201   APPEARANCEUR Hazy 08/11/2012 1201   LABSPEC 1.020 08/11/2012 1201   PHURINE 5.0 08/11/2012 1201   GLUCOSEU Negative 08/11/2012 1201   HGBUR Negative 08/11/2012 1201   BILIRUBINUR neg 02/11/2015 0950   BILIRUBINUR Negative 08/11/2012 1201   KETONESUR Negative 08/11/2012 1201   PROTEINUR neg 02/11/2015 0950   PROTEINUR Negative 08/11/2012 1201   UROBILINOGEN 0.2 02/11/2015 0950   NITRITE neg 02/11/2015 0950   NITRITE Negative 08/11/2012 1201   LEUKOCYTESUR Negative 02/11/2015 0950   LEUKOCYTESUR 1+ 08/11/2012 1201     RADIOLOGY: Dg Chest Port 1 View  Result Date: 06/29/2017 CLINICAL DATA:  Shortness of breath.  Atrial fibrillation. EXAM: PORTABLE CHEST 1 VIEW COMPARISON:  One-view chest x-ray 05/18/2017. FINDINGS: The heart size is exaggerated by low lung volumes. Mild pulmonary vascular congestion is present without frank edema. There are no effusions. Aortic atherosclerosis is again seen. The left lower lobe pulmonary nodule is stable. IMPRESSION: 1. Low lung volumes and mild pulmonary vascular congestion. 2.  Aortic Atherosclerosis (ICD10-I70.0). Electronically Signed   By: San Morelle M.D.   On: 06/29/2017 13:16    EKG: Orders placed  or performed during the hospital encounter of 06/30/17  . EKG 12-Lead  . EKG 12-Lead  . ED EKG  . ED EKG  . EKG 12-Lead  . EKG 12-Lead    IMPRESSION AND PLAN:  * A. Fib with RVR   IV amiodarone drip as suggested by ER physician and cardiologist.   She has 3 troponins checked in last 24 hours and all of them are stable, so I would not do any more.   TSH yesterday was normal.   Echocardiogram done 6 months ago.   Continue oral metoprolol.   Monitor on telemetry and get cardiology consult.   Urinalysis is not checked yet, I ordered for now.  * diabetes   Continue her NPH, keep on sliding scale coverage.  * hyperlipidemia   Continue atorvastatin.  * hypertension   Continue metoprolol and amlodipine.  * history of TIA   Continue aspirin, Plavix, atorvastatin.  * hypothyroidism   TSH normal, continue levothyroxine.  All the records are reviewed and case discussed with ED  provider. Management plans discussed with the patient, family and they are in agreement.  CODE STATUS:Limited code Code Status History    Date Active Date Inactive Code Status Order ID Comments User Context   12/09/2016 21:28 12/10/2016 21:12 DNR [id number]  Hillary Bow, MD ED    Questions for Most Recent Historical Code Status (Order [id number])    Question Answer Comment   In the event of cardiac or respiratory ARREST Do not call a "code blue"    In the event of cardiac or respiratory ARREST Do not perform Intubation, CPR, defibrillation or ACLS    In the event of cardiac or respiratory ARREST Use medication by any route, position, wound care, and other measures to relive pain and suffering. May use oxygen, suction and manual treatment of airway obstruction as needed for comfort.        TOTAL TIME TAKING CARE OF THIS PATIENT: 50 minutes.  HER-2 daughters are present in the emergency room.  Vaughan Basta M.D on 06/30/2017   Between 7am to 6pm - Pager - [phone]  After 6pm go to  [email or web address] - password [email or web address] [facility]  Office  [phone]  CC: Primary care physician; Kirk Ruths, MD   Note: This dictation was prepared with Dragon dictation along with smaller phrase technology. Any transcriptional errors that result from this process are unintentional.

## 2017-06-30 NOTE — ED Notes (Addendum)
Diet sandwich tray and water given to patient. Family remains at bedside

## 2017-06-30 NOTE — ED Notes (Signed)
Allison RN, aware of bed assigned  

## 2017-06-30 NOTE — Progress Notes (Signed)
Family Meeting Note  Advance Directive:yes  Today a meeting took place with the Patient and 2 daughters.  The following clinical team members were present during this meeting:MD  The following were discussed:Patient's diagnosis: A fib with RVR , Patient's progosis: Unable to determine and Goals for treatment: limited code, patient wanted CPR and defibrillator but does not want to have intubation or ventilatory support.  Additional follow-up to be provided: Cardiologist.  Time spent during discussion:20 minutes  Altamese Dilling, MD

## 2017-06-30 NOTE — ED Notes (Signed)
MD notified of pt BP. MD requested metoprolol still be administered at this time

## 2017-06-30 NOTE — ED Notes (Signed)
ED Provider at bedside. 

## 2017-07-01 ENCOUNTER — Other Ambulatory Visit: Payer: Self-pay

## 2017-07-01 LAB — BASIC METABOLIC PANEL
ANION GAP: 9 (ref 5–15)
BUN: 20 mg/dL (ref 6–20)
CO2: 20 mmol/L — AB (ref 22–32)
Calcium: 8.3 mg/dL — ABNORMAL LOW (ref 8.9–10.3)
Chloride: 103 mmol/L (ref 101–111)
Creatinine, Ser: 0.99 mg/dL (ref 0.44–1.00)
GFR calc Af Amer: 58 mL/min — ABNORMAL LOW (ref >60)
GFR, EST NON AFRICAN AMERICAN: 50 mL/min — AB (ref >60)
GLUCOSE: 212 mg/dL — AB (ref 65–99)
POTASSIUM: 3.6 mmol/L (ref 3.5–5.1)
Sodium: 132 mmol/L — ABNORMAL LOW (ref 135–145)

## 2017-07-01 LAB — GLUCOSE, CAPILLARY
GLUCOSE-CAPILLARY: 203 mg/dL — AB (ref 65–99)
Glucose-Capillary: 214 mg/dL — ABNORMAL HIGH (ref 65–99)
Glucose-Capillary: 158 mg/dL — ABNORMAL HIGH (ref 65–99)
Glucose-Capillary: 258 mg/dL — ABNORMAL HIGH (ref 65–99)

## 2017-07-01 LAB — URINALYSIS, COMPLETE (UACMP) WITH MICROSCOPIC
Bilirubin Urine: NEGATIVE
Ketones, ur: NEGATIVE mg/dL
LEUKOCYTES UA: NEGATIVE
NITRITE: NEGATIVE
PH: 5 (ref 5.0–8.0)
Protein, ur: NEGATIVE mg/dL
SPECIFIC GRAVITY, URINE: 1.018 (ref 1.005–1.030)

## 2017-07-01 LAB — CBC
HEMATOCRIT: 33.2 % — AB (ref 35.0–47.0)
HEMOGLOBIN: 10.9 g/dL — AB (ref 12.0–16.0)
MCH: 26.7 pg (ref 26.0–34.0)
MCHC: 33 g/dL (ref 32.0–36.0)
MCV: 80.9 fL (ref 80.0–100.0)
Platelets: 249 10*3/uL (ref 150–440)
RBC: 4.1 MIL/uL (ref 3.80–5.20)
RDW: 14.3 % (ref 11.5–14.5)
WBC: 10.7 10*3/uL (ref 3.6–11.0)

## 2017-07-01 MED ORDER — AMIODARONE HCL IN DEXTROSE 360-4.14 MG/200ML-% IV SOLN
30.0000 mg/h | INTRAVENOUS | Status: DC
  Administered 2017-07-01 – 2017-07-03 (×3): 30 mg/h via INTRAVENOUS
  Filled 2017-07-01 (×4): qty 200

## 2017-07-01 MED ORDER — FUROSEMIDE 10 MG/ML IJ SOLN
60.0000 mg | Freq: Once | INTRAMUSCULAR | Status: AC
  Administered 2017-07-01: 60 mg via INTRAVENOUS
  Filled 2017-07-01: qty 6

## 2017-07-01 MED ORDER — AMIODARONE HCL IN DEXTROSE 360-4.14 MG/200ML-% IV SOLN
60.0000 mg/h | INTRAVENOUS | Status: AC
  Administered 2017-07-01 (×2): 60 mg/h via INTRAVENOUS
  Filled 2017-07-01: qty 200

## 2017-07-01 MED ORDER — PREMIER PROTEIN SHAKE
11.0000 [oz_av] | Freq: Two times a day (BID) | ORAL | Status: DC
  Administered 2017-07-01 – 2017-07-03 (×5): 11 [oz_av] via ORAL

## 2017-07-01 MED ORDER — FUROSEMIDE 10 MG/ML IJ SOLN
40.0000 mg | Freq: Once | INTRAMUSCULAR | Status: AC
  Administered 2017-07-01: 40 mg via INTRAVENOUS
  Filled 2017-07-01: qty 4

## 2017-07-01 MED ORDER — METOPROLOL SUCCINATE ER 100 MG PO TB24
100.0000 mg | ORAL_TABLET | Freq: Every day | ORAL | Status: DC
  Administered 2017-07-01 – 2017-07-02 (×2): 100 mg via ORAL
  Filled 2017-07-01 (×2): qty 1

## 2017-07-01 NOTE — Progress Notes (Signed)
[facility] [place] [facility]   PATIENT NAME: Peggy [name]    MR#:  [id number]  DATE OF BIRTH:  [birth year]  SUBJECTIVE:  CHIEF COMPLAINT:   Chief Complaint  Patient presents with  . Shortness of Breath  . Palpitations   Patient on amiodarone drip and continues to have tachycardia in the 140s.  Complains of shortness of breath and chest congestion.  REVIEW OF SYSTEMS:    Review of Systems  Constitutional: Positive for malaise/fatigue. Negative for chills and fever.  HENT: Negative for sore throat.   Eyes: Negative for blurred vision, double vision and pain.  Respiratory: Positive for cough and shortness of breath. Negative for hemoptysis and wheezing.   Cardiovascular: Positive for palpitations and orthopnea. Negative for chest pain and leg swelling.  Gastrointestinal: Negative for abdominal pain, constipation, diarrhea, heartburn, nausea and vomiting.  Genitourinary: Negative for dysuria and hematuria.  Musculoskeletal: Negative for back pain and joint pain.  Skin: Negative for rash.  Neurological: Positive for weakness. Negative for sensory change, speech change, focal weakness and headaches.  Endo/Heme/Allergies: Does not bruise/bleed easily.  Psychiatric/Behavioral: Negative for depression. The patient is not nervous/anxious.     DRUG ALLERGIES:   Allergies  Allergen Reactions  . Diphenhydramine Rash    AGITATION/DELIRIUM  . Metformin Diarrhea  . Oxybutynin Other (See Comments)    dizzy  . Amlodipine Rash  . Ciprofloxacin Rash  . Lisinopril Rash  . Penicillins Rash  . Saxagliptin Rash    VITALS:  Blood pressure 122/76, pulse (!) 104, temperature (!) 97.5 F (36.4 C), temperature source Oral, resp. rate 20, height 5\' 2"  (1.575 m), weight 68.9 kg (152 lb), SpO2 (!) 88 %.  PHYSICAL EXAMINATION:   Physical Exam  GENERAL:  84 y.o.-year-old patient lying in the bed with no acute distress.  EYES: Pupils equal, round, reactive to light and  accommodation. No scleral icterus. Extraocular muscles intact.  HEENT: Head atraumatic, normocephalic. Oropharynx and nasopharynx clear.  NECK:  Supple, no jugular venous distention. No thyroid enlargement, no tenderness.  LUNGS: Left basilar crackles CARDIOVASCULAR: Tachycardia.  Irregularly irregular. ABDOMEN: Soft, nontender, nondistended. Bowel sounds present. No organomegaly or mass.  EXTREMITIES: No cyanosis, clubbing or edema b/l.    NEUROLOGIC: Cranial nerves II through XII are intact. No focal Motor or sensory deficits b/l.   PSYCHIATRIC: The patient is alert and oriented x 3.  SKIN: No obvious rash, lesion, or ulcer.   LABORATORY PANEL:   CBC Recent Labs  Lab 07/01/17 0402  WBC 10.7  HGB 10.9*  HCT 33.2*  PLT 249   ------------------------------------------------------------------------------------------------------------------ Chemistries  Recent Labs  Lab 06/30/17 1226 07/01/17 0402  NA 130* 132*  K 3.9 3.6  CL 100* 103  CO2 21* 20*  GLUCOSE 321* 212*  BUN 25* 20  CREATININE 1.23* 0.99  CALCIUM 8.6* 8.3*  AST 82*  --   ALT 50  --   ALKPHOS 43  --   BILITOT 0.8  --    ------------------------------------------------------------------------------------------------------------------  Cardiac Enzymes Recent Labs  Lab 06/30/17 1226  TROPONINI 0.42*   ------------------------------------------------------------------------------------------------------------------  RADIOLOGY:  Dg Chest Port 1 View  Result Date: 06/29/2017 CLINICAL DATA:  Shortness of breath.  Atrial fibrillation. EXAM: PORTABLE CHEST 1 VIEW COMPARISON:  One-view chest x-ray 05/18/2017. FINDINGS: The heart size is exaggerated by low lung volumes. Mild pulmonary vascular congestion is present without frank edema. There are no effusions. Aortic atherosclerosis is again seen. The left lower lobe pulmonary nodule is stable. IMPRESSION: 1. Low  lung volumes and mild pulmonary vascular congestion.  2.  Aortic Atherosclerosis (ICD10-I70.0). Electronically Signed   By: Marin Roberts M.D.   On: 06/29/2017 13:16     ASSESSMENT AND PLAN:   * A. Fib with RVR   IV amiodarone drip    TSH normal.   Echocardiogramreviewed  Increase metoprolol dose   Appreciate cardiology input  *Acute on chronic diastolic congestive heart failure.  Start IV Lasix.  Monitor input and output.  Repeat BMP in the morning.  Replace potassium as needed.  * Diabetes mellitus   Continue her NPH  sliding scale coverage.  * hypertension   Continue metoprolol and amlodipine.  * H/O TIA   Continue aspirin, Plavix, atorvastatin.  * Hypothyroidism   TSH normal, continue levothyroxine.  All the records are reviewed and case discussed with Care Management/Social Worker Management plans discussed with the patient, family and they are in agreement.  CODE STATUS: Partial code  DVT Prophylaxis: SCDs  TOTAL TIME TAKING CARE OF THIS PATIENT: 35 minutes.   POSSIBLE D/C IN 2-3 DAYS, DEPENDING ON CLINICAL CONDITION.  Molinda Bailiff Jodene Polyak M.D on 07/01/2017 at 12:48 PM  Between 7am to 6pm - Pager - [phone]  After 6pm go to [email or web address] - password [email or web address]  [facility] [place] [facility]  Office  [phone]  CC: Primary care physician; Lauro Regulus, MD  Note: This dictation was prepared with Dragon dictation along with smaller phrase technology. Any transcriptional errors that result from this process are unintentional.

## 2017-07-01 NOTE — Progress Notes (Signed)
Patient was transferred from the ER following c/o of30hest palpitation. Patient was found to be in afib/RVR and amio gtt was initiated in the ER. On admission patient was on amio gtt infusing at 60mg /hr,anf was latter titrated down to 30mg /hr. Patient remained in consistent afib with HR in the 120-130. Per Dr. Anne Hahn, amio was rate was chnaged back to 60mg /hr. Patient was A&O X4, denied being pain, but endorsed dyspnea at rest and on exertion. Patient remianed in afib in the 110-120 overnight.

## 2017-07-01 NOTE — Progress Notes (Signed)
Initial Nutrition Assessment  DOCUMENTATION CODES:   Not applicable  INTERVENTION:   Premier Protein BID, each supplement provides 160 kcal and 30 grams of protein.   MVI daily  Liberalize diet  NUTRITION DIAGNOSIS:   Increased nutrient needs related to catabolic illness(heart disease) as evidenced by increased estimated needs from protein.  GOAL:   Patient will meet greater than or equal to 90% of their needs  MONITOR:   PO intake, Supplement acceptance, Labs, Weight trends, I & O's  REASON FOR ASSESSMENT:   Malnutrition Screening Tool    ASSESSMENT:   84 y.o. female with known paroxysmal nonvalvular atrial fibrillation essential hypertension mixed hyperlipidemia diabetes with previous history of transient ischemic attack and also coronary artery disease status post coronary artery PCI and stent placement.    Unable to see pt today x 2 visits. Per chart, pt eating 95% of meals and is weight stable. RD will order supplements to help pt meet her estimated needs. Will liberalize the fat restriction from pt's diet as this restricts protein as well.   Medications reviewed and include: aspirin, plavix, lasix, heparin, insulin, synthroid, MVI  Labs reviewed: Na 132(L), Ca 8.3(L) BNP- 412(H)- 1/28 cbgs- 294, 321, 212 x 48hrs AIC 8.8(H)- 11/2016  Unable to complete Nutrition-Focused physical exam at this time.   Diet Order:  Diet Carb Modified Fluid consistency: Thin; Room service appropriate? Yes  EDUCATION NEEDS:   Not appropriate for education at this time  Skin:  Reviewed RN Assessment  Last BM:  PTA  Height:   Ht Readings from Last 1 Encounters:  06/30/17 5\' 2"  (1.575 m)    Weight:   Wt Readings from Last 1 Encounters:  06/30/17 152 lb (68.9 kg)    Ideal Body Weight:  50 kg  BMI:  Body mass index is 27.8 kg/m.  Estimated Nutritional Needs:   Kcal:  1300-1500kcal/day   Protein:  69-83g/day   Fluid:  >1.3L/day or per MD  Betsey Holiday MS,  RD, LDN Pager #[phone] After Hours Pager: [phone]

## 2017-07-01 NOTE — Progress Notes (Signed)
Advance care planning  Met with patient and daughter at bedside.  They requested information regarding what resuscitation involves.  We went through process of a ventilator/intubation/chest compressions/defibrillation.  Patient has not had these procedures in the past.  Initially her thoughts were to have CPR and defibrillation done but no ventilator.  After explaining the process with daughter at bedside patient is wanting to be a full code only has short-term.  Daughter is the healthcare power of attorney.  CODE STATUS changed.  Explained her condition with atrial fibrillation with rapid ventricular rate/congestive heart failure with long-term prognosis and treatment plan.  Time spent 20 minutes

## 2017-07-01 NOTE — Plan of Care (Signed)
  Progressing Education: Knowledge of General Education information will improve 07/01/2017 0430 - Progressing by Kerrie Buffalo, RN Health Behavior/Discharge Planning: Ability to manage health-related needs will improve 07/01/2017 0430 - Progressing by Kerrie Buffalo, RN Clinical Measurements: Ability to maintain clinical measurements within normal limits will improve 07/01/2017 0430 - Progressing by Kerrie Buffalo, RN Will remain free from infection 07/01/2017 0430 - Progressing by Kerrie Buffalo, RN Diagnostic test results will improve 07/01/2017 0430 - Progressing by Kerrie Buffalo, RN Respiratory complications will improve 07/01/2017 0430 - Progressing by Kerrie Buffalo, RN Cardiovascular complication will be avoided 07/01/2017 0430 - Progressing by Kerrie Buffalo, RN Activity: Risk for activity intolerance will decrease 07/01/2017 0430 - Progressing by Kerrie Buffalo, RN Nutrition: Adequate nutrition will be maintained 07/01/2017 0430 - Progressing by Kerrie Buffalo, RN Coping: Level of anxiety will decrease 07/01/2017 0430 - Progressing by Kerrie Buffalo, RN Elimination: Will not experience complications related to bowel motility 07/01/2017 0430 - Progressing by Kerrie Buffalo, RN Will not experience complications related to urinary retention 07/01/2017 0430 - Progressing by Kerrie Buffalo, RN Safety: Ability to remain free from injury will improve 07/01/2017 0430 - Progressing by Kerrie Buffalo, RN Skin Integrity: Risk for impaired skin integrity will decrease 07/01/2017 0430 - Progressing by Kerrie Buffalo, RN Education: Knowledge of disease or condition will improve 07/01/2017 0430 - Progressing by Kerrie Buffalo, RN Understanding of medication regimen will improve 07/01/2017 0430 - Progressing by Kerrie Buffalo, RN Activity: Ability to tolerate  increased activity will improve 07/01/2017 0430 - Progressing by Kerrie Buffalo, RN Cardiac: Ability to achieve and maintain adequate cardiopulmonary perfusion will improve 07/01/2017 0430 - Progressing by Kerrie Buffalo, RN Health Behavior/Discharge Planning: Ability to safely manage health-related needs after discharge will improve 07/01/2017 0430 - Progressing by Kerrie Buffalo, RN

## 2017-07-01 NOTE — Plan of Care (Signed)
  Clinical Measurements: Ability to maintain clinical measurements within normal limits will improve 07/01/2017 1354 - Not Progressing by Raynald Blend, RN Note Sodium today = only 132. Will continue to monitor electrolyte levels. Jari Favre [facility]

## 2017-07-01 NOTE — Consult Note (Signed)
[facility] Cardiology Consultation Note  Patient ID: Peggy [name], MRN: [id number], DOB/AGE: January 15, 1929 84 y.o. Admit date: 06/30/2017   Date of Consult: 07/01/2017 Primary Physician: Lauro Regulus, MD Primary Cardiologist: Higinio Roger  Chief Complaint:  Chief Complaint  Patient presents with  . Shortness of Breath  . Palpitations   Reason for Consult: Atrial fibrillation with rapid ventricular rate  HPI: 84 y.o. female with known paroxysmal nonvalvular atrial fibrillation essential hypertension mixed hyperlipidemia diabetes with previous history of transient ischemic attack and also coronary artery disease status post coronary artery PCI and stent placement.  The patient has had recent exacerbation of atrial fibrillation for which the patient was seen in the emergency room and had rapid ventricular rate.  The patient was given intravenous diltiazem and did convert back to normal sinus rhythm and was sent home.  The patient was to follow-up but did not have time and came back the next day with atrial fibrillation with rapid ventricular rate.  At this time she had some shortness of breath weakness and occasional little chest pain.  There was no evidence of true angina or congestive heart failure.  The patient did have a troponin elevation of 0.42 more consistent with demand ischemia rather than a quart coronary sooner.  The patient now has had addition of amiodarone load for further management of atrial fibrillation and has had low-dose beta-blocker.  She still has atrial fibrillation with rapid ventricular rate at 120 bpm and is not converted back to normal sinus rhythm.  There has been no further evidence of angina or heart failure at this time.  The patient does have some nose bleeding at this time and has not used anticoagulation in the past due to the patient family wishes as well as her wishes due to some bleeding  Past Medical History:  Diagnosis Date  . A-fib (HCC)   . Allergy    . Arthritis   . Diabetes mellitus without complication (HCC)   . GERD (gastroesophageal reflux disease)   . Hyperlipidemia   . Hypertension   . TIA (transient ischemic attack)       Surgical History:  Past Surgical History:  Procedure Laterality Date  . ABDOMINAL HYSTERECTOMY  1970  . BACK SURGERY  2013  . colectomy Right 10/08/2013   Dr. Egbert Garibaldi  . CORONARY ANGIOPLASTY WITH STENT PLACEMENT       Home Meds: Prior to Admission medications   Medication Sig Start Date End Date Taking? Authorizing Provider  amLODipine (NORVASC) 5 MG tablet Take 1 tablet by mouth daily. 04/14/17  Yes [provider]  atorvastatin (LIPITOR) 40 MG tablet Take 1 tablet (40 mg total) by mouth daily at 6 PM. 12/10/16  Yes Wieting, Richard, MD  clopidogrel (PLAVIX) 75 MG tablet Take 1 tablet (75 mg total) by mouth daily. 12/10/16  Yes Wieting, Richard, MD  fenofibrate (TRICOR) 145 MG tablet Take 1 tablet (145 mg total) by mouth daily. 09/10/15  Yes Lorie Phenix, MD  fluticasone [facility]) 50 MCG/ACT nasal spray USE ONE SPRAY IN EACH NOSTRIL DAILY 09/27/15  Yes Lorie Phenix, MD  HUMULIN 70/30 KWIKPEN (70-30) 100 UNIT/ML PEN Inject 32 Units into the skin at bedtime. 06/11/17  Yes [provider]  levothyroxine (SYNTHROID, LEVOTHROID) 50 MCG tablet Take 1 tablet (50 mcg total) by mouth daily. PATIENT NEEDS TO SCHEDULE OFFICE VISIT FOR FOLLOW UP 02/01/16  Yes Malva Limes, MD  metoprolol succinate (TOPROL-XL) 50 MG 24 hr tablet TAKE 0.5 TABLET BY MOUTH  EVERY DAY Patient taking differently: Take 50 mg by mouth daily.  12/10/16  Yes Wieting, Richard, MD  Multiple Vitamin (MULTIVITAMIN) tablet Take 1 tablet by mouth daily.   Yes [provider]  ondansetron (ZOFRAN ODT) 4 MG disintegrating tablet Take 1 tablet (4 mg total) by mouth every 8 (eight) hours as needed for nausea or vomiting. 06/29/17  Yes Emily Filbert, MD  Potassium 99 MG TABS Take 1 tablet by mouth daily.   Yes [provider]  fluocinonide ointment (LIDEX) 0.05 % Apply 1 application topically daily as needed. 11/17/16   [provider]  HYDROcodone-acetaminophen (NORCO/VICODIN) 5-325 MG tablet Take 1 tablet by mouth 2 (two) times daily. As needed for chronic low back pain 07/18/15   Lorie Phenix, MD  rOPINIRole (REQUIP) 0.25 MG tablet Take 1 tablet (0.25 mg total) by mouth at bedtime. Patient not taking: Reported on 06/30/2017 12/10/16 12/10/17  Alford Highland, MD  VENTOLIN HFA 108 [phone] Base) MCG/ACT inhaler Inhale 2 puffs into the lungs every 6 (six) hours as needed. 05/24/17   [provider]    Inpatient Medications:  . aspirin EC  81 mg Oral Daily  . atorvastatin  40 mg Oral q1800  . clopidogrel  75 mg Oral Daily  . fenofibrate  160 mg Oral Daily  . fluticasone  1 spray Each Nare Daily  . heparin  5,000 Units Subcutaneous Q8H  . insulin aspart  0-5 Units Subcutaneous QHS  . insulin aspart  0-9 Units Subcutaneous TID WC  . insulin aspart protamine- aspart  15 Units Subcutaneous BID WC  . levothyroxine  50 mcg Oral QAC breakfast  . metoprolol succinate  100 mg Oral Daily  . multivitamin with minerals  1 tablet Oral Daily   . amiodarone 30 mg/hr (07/01/17 0716)    Allergies:  Allergies  Allergen Reactions  . Diphenhydramine Rash    AGITATION/DELIRIUM  . Metformin Diarrhea  . Oxybutynin Other (See Comments)    dizzy  . Amlodipine Rash  . Ciprofloxacin Rash  . Lisinopril Rash  . Penicillins Rash  . Saxagliptin Rash    Social History   Socioeconomic History  . Marital status: Widowed    Spouse name: Not on file  . Number of children: 3  . Years of education: H/S  . Highest education level: Not on file  Social Needs  . Financial resource strain: Not on file  . Food insecurity - worry: Not on file  . Food insecurity - inability: Not on file  . Transportation needs - medical: Not on file  . Transportation needs - non-medical: Not on file  Occupational History   . Occupation: Retired  Tobacco Use  . Smoking status: Former Smoker    Packs/day: 1.00    Years: 30.00    Pack years: 30.00    Last attempt to quit: 11/30/1999    Years since quitting: 17.5  . Smokeless tobacco: Never Used  Substance and Sexual Activity  . Alcohol use: No  . Drug use: No  . Sexual activity: Not on file  Other Topics Concern  . Not on file  Social History Narrative  . Not on file     Family History  Problem Relation Age of Onset  . Breast cancer Mother   . Pancreatitis Father   . Breast cancer Sister   . Lung cancer Brother   . Melanoma Brother   . Throat cancer Brother      Review of Systems Positive for nosebleed palpitations  Negative for: General:  chills, fever, night sweats or weight changes.  Cardiovascular: PND orthopnea syncope dizziness  Dermatological skin lesions rashes Respiratory: Cough congestion Urologic: Frequent urination urination at night and hematuria Abdominal: negative for nausea, vomiting, diarrhea, bright red blood per rectum, melena, or hematemesis Neurologic: negative for visual changes, and/or hearing changes  All other systems reviewed and are otherwise negative except as noted above.  Labs: Recent Labs    06/29/17 1224 06/29/17 1415 06/30/17 1226  TROPONINI 0.52* 0.42* 0.42*   Lab Results  Component Value Date   WBC 10.7 07/01/2017   HGB 10.9 (L) 07/01/2017   HCT 33.2 (L) 07/01/2017   MCV 80.9 07/01/2017   PLT 249 07/01/2017    Recent Labs  Lab 06/30/17 1226 07/01/17 0402  NA 130* 132*  K 3.9 3.6  CL 100* 103  CO2 21* 20*  BUN 25* 20  CREATININE 1.23* 0.99  CALCIUM 8.6* 8.3*  PROT 6.3*  --   BILITOT 0.8  --   ALKPHOS 43  --   ALT 50  --   AST 82*  --   GLUCOSE 321* 212*   Lab Results  Component Value Date   CHOL 175 12/10/2016   HDL 29 (L) 12/10/2016   LDLCALC 69 12/10/2016   TRIG 387 (H) 12/10/2016   No results found for: DDIMER  Radiology/Studies:  Dg Chest Port 1 View  Result Date:  06/29/2017 CLINICAL DATA:  Shortness of breath.  Atrial fibrillation. EXAM: PORTABLE CHEST 1 VIEW COMPARISON:  One-view chest x-ray 05/18/2017. FINDINGS: The heart size is exaggerated by low lung volumes. Mild pulmonary vascular congestion is present without frank edema. There are no effusions. Aortic atherosclerosis is again seen. The left lower lobe pulmonary nodule is stable. IMPRESSION: 1. Low lung volumes and mild pulmonary vascular congestion. 2.  Aortic Atherosclerosis (ICD10-I70.0). Electronically Signed   By: Marin Roberts M.D.   On: 06/29/2017 13:16    EKG: Atrial fibrillation with rapid ventricular rate nonspecific ST and T wave changes  Weights: Filed Weights   06/30/17 1223 06/30/17 1226  Weight: 152 lb (68.9 kg) 152 lb (68.9 kg)     Physical Exam: Blood pressure 122/76, pulse (!) 104, temperature (!) 97.5 F (36.4 C), temperature source Oral, resp. rate 20, height 5\' 2"  (1.575 m), weight 152 lb (68.9 kg), SpO2 (!) 88 %. Body mass index is 27.8 kg/m. General: Well developed, well nourished, in no acute distress. Head eyes ears nose throat: Normocephalic, atraumatic, sclera non-icteric, no xanthomas, nares are without discharge. No apparent thyromegaly and/or mass  Lungs: Normal respiratory effort.  no wheezes, no rales, no rhonchi.  Heart: Irregular with normal S1 S2. no murmur gallop, no rub, PMI is normal size and placement, carotid upstroke normal without bruit, jugular venous pressure is normal Abdomen: Soft, non-tender, non-distended with normoactive bowel sounds. No hepatomegaly. No rebound/guarding. No obvious abdominal masses. Abdominal aorta is normal size without bruit Extremities: Trace edema. no cyanosis, no clubbing, no ulcers  Peripheral : 2+ bilateral upper extremity pulses, 2+ bilateral femoral pulses, 2+ bilateral dorsal pedal pulse Neuro: Alert and oriented. No facial asymmetry. No focal deficit. Moves all extremities spontaneously. Musculoskeletal:  Normal muscle tone without kyphosis Psych:  Responds to questions appropriately with a normal affect.    Assessment: 84 year old female with essential hypertension mixed hyperlipidemia diabetes with paroxysmal nonvalvular atrial fibrillation with rapid ventricular rate and elevated troponin consistent with demand ischemia rather than acute coronary syndrome and no evidence of heart failure  Plan: 1.  Continue amiodarone drip at this time for further infusion and loading for possible conversion to normal sinus rhythm 2.  Increased beta-blocker for heart rate control during time for conversion to normal rhythm 3.  Further consideration of transesophageal echocardiogram with electrical cardioversion as necessary 4.  Continue heparin during infusion and atrial fibrillation for further risk reduction and stroke with atrial fibrillation and further discussion of possible true anticoagulation for further risk reduction and stroke thereafter 5.  No further cardiac diagnostics necessary at this time 6.  Further treatment options after above  Signed, Lamar Blinks M.D. [facility] Cardiology 07/01/2017, 8:11 AM

## 2017-07-02 LAB — GLUCOSE, CAPILLARY
GLUCOSE-CAPILLARY: 215 mg/dL — AB (ref 65–99)
GLUCOSE-CAPILLARY: 256 mg/dL — AB (ref 65–99)
Glucose-Capillary: 153 mg/dL — ABNORMAL HIGH (ref 65–99)
Glucose-Capillary: 213 mg/dL — ABNORMAL HIGH (ref 65–99)

## 2017-07-02 MED ORDER — POTASSIUM CHLORIDE CRYS ER 20 MEQ PO TBCR
40.0000 meq | EXTENDED_RELEASE_TABLET | Freq: Once | ORAL | Status: AC
  Administered 2017-07-02: 40 meq via ORAL
  Filled 2017-07-02: qty 2

## 2017-07-02 MED ORDER — METOPROLOL TARTRATE 50 MG PO TABS
100.0000 mg | ORAL_TABLET | Freq: Two times a day (BID) | ORAL | Status: DC
  Administered 2017-07-02 – 2017-07-03 (×2): 100 mg via ORAL
  Filled 2017-07-02 (×3): qty 2

## 2017-07-02 MED ORDER — FUROSEMIDE 10 MG/ML IJ SOLN
40.0000 mg | Freq: Once | INTRAMUSCULAR | Status: AC
  Administered 2017-07-02: 40 mg via INTRAVENOUS
  Filled 2017-07-02: qty 4

## 2017-07-02 MED ORDER — DILTIAZEM HCL 30 MG PO TABS
60.0000 mg | ORAL_TABLET | Freq: Once | ORAL | Status: AC
  Administered 2017-07-02: 60 mg via ORAL
  Filled 2017-07-02: qty 2

## 2017-07-02 MED ORDER — IPRATROPIUM-ALBUTEROL 0.5-2.5 (3) MG/3ML IN SOLN
3.0000 mL | Freq: Once | RESPIRATORY_TRACT | Status: AC
  Administered 2017-07-02: 3 mL via RESPIRATORY_TRACT
  Filled 2017-07-02: qty 3

## 2017-07-02 NOTE — Plan of Care (Signed)
Patient remains on amiodarone gtt at 30 mg/hr Patient short of breath at rest and with exertion, HR into 130's with exertion. 60 mg PO diltiazem given once for HR.   IV lasix ordered for bilateral crackles and breathing tx given for wheezing.  Will continue to assess and monitor.   Education: Knowledge of General Education information will improve 07/02/2017 1455 - Progressing by Jake Bathe D, RN   Clinical Measurements: Ability to maintain clinical measurements within normal limits will improve 07/02/2017 1455 - Not Progressing by Jake Bathe D, RN Respiratory complications will improve 07/02/2017 1455 - Not Progressing by Jake Bathe D, RN   Activity: Risk for activity intolerance will decrease 07/02/2017 1455 - Not Progressing by Jake Bathe D, RN   Pain Managment: General experience of comfort will improve 07/02/2017 1455 - Progressing by Jake Bathe D, RN   Safety: Ability to remain free from injury will improve 07/02/2017 1455 - Progressing by Jake Bathe D, RN   Activity: Ability to tolerate increased activity will improve 07/02/2017 1455 - Not Progressing by Heath Lark, RN

## 2017-07-02 NOTE — Progress Notes (Signed)
[facility] Cardiology [facility] Encounter Note  Patient: Peggy [name] / Admit Date: 06/30/2017 / Date of Encounter: 07/02/2017, 8:40 AM   Subjective: Patient feels much better today.  No evidence of breath cough or congestion.  Heart rate still somewhat more rapid and continues to be atrial fibrillation despite higher doses of metoprolol and amiodarone drip.  No evidence of angina or heart failure  Review of Systems: Positive for: Shortness of breath Negative for: Vision change, hearing change, syncope, dizziness, nausea, vomiting,diarrhea, bloody stool, stomach pain, cough, congestion, diaphoresis, urinary frequency, urinary pain,skin lesions, skin rashes Others previously listed  Objective: Telemetry: Atrial fibrillation with wide-complex EKG Physical Exam: Blood pressure (!) 118/56, pulse (!) 130, temperature 98.6 F (37 C), temperature source Oral, resp. rate 16, height 5\' 2"  (1.575 m), weight 152 lb (68.9 kg), SpO2 94 %. Body mass index is 27.8 kg/m. General: Well developed, well nourished, in no acute distress. Head: Normocephalic, atraumatic, sclera non-icteric, no xanthomas, nares are without discharge. Neck: No apparent masses Lungs: Normal respirations with no wheezes, no rhonchi, no rales , few crackles   Heart: Irregular rate and rhythm, normal S1 S2, no murmur, no rub, no gallop, PMI is normal size and placement, carotid upstroke normal without bruit, jugular venous pressure normal Abdomen: Soft, non-tender, non-distended with normoactive bowel sounds. No hepatosplenomegaly. Abdominal aorta is normal size without bruit Extremities: No edema, no clubbing, no cyanosis, no ulcers,  Peripheral: 2+ radial, 2+ femoral, 2+ dorsal pedal pulses Neuro: Alert and oriented. Moves all extremities spontaneously. Psych:  Responds to questions appropriately with a normal affect.   Intake/Output Summary (Last 24 hours) at 07/02/2017 0840 Last data filed at 07/02/2017 0518 Gross per 24 hour   Intake 764.94 ml  Output 900 ml  Net -135.06 ml    Inpatient Medications:  . aspirin EC  81 mg Oral Daily  . atorvastatin  40 mg Oral q1800  . clopidogrel  75 mg Oral Daily  . fenofibrate  160 mg Oral Daily  . fluticasone  1 spray Each Nare Daily  . heparin  5,000 Units Subcutaneous Q8H  . insulin aspart  0-5 Units Subcutaneous QHS  . insulin aspart  0-9 Units Subcutaneous TID WC  . insulin aspart protamine- aspart  15 Units Subcutaneous BID WC  . levothyroxine  50 mcg Oral QAC breakfast  . metoprolol succinate  100 mg Oral Daily  . multivitamin with minerals  1 tablet Oral Daily  . protein supplement shake  11 oz Oral BID BM   Infusions:  . amiodarone 30 mg/hr (07/02/17 0720)    Labs: Recent Labs    06/30/17 1226 07/01/17 0402  NA 130* 132*  K 3.9 3.6  CL 100* 103  CO2 21* 20*  GLUCOSE 321* 212*  BUN 25* 20  CREATININE 1.23* 0.99  CALCIUM 8.6* 8.3*   Recent Labs    06/30/17 1226  AST 82*  ALT 50  ALKPHOS 43  BILITOT 0.8  PROT 6.3*  ALBUMIN 3.1*   Recent Labs    06/29/17 1224 06/30/17 1226 07/01/17 0402  WBC 10.3 11.4* 10.7  NEUTROABS 8.6*  --   --   HGB 12.9 12.0 10.9*  HCT 39.6 36.8 33.2*  MCV 81.5 81.3 80.9  PLT 217 245 249   Recent Labs    06/29/17 1224 06/29/17 1415 06/30/17 1226  TROPONINI 0.52* 0.42* 0.42*   Invalid input(s): POCBNP No results for input(s): HGBA1C in the last 72 hours.   Weights: [employer]   06/30/17  1223 06/30/17 1226  Weight: 152 lb (68.9 kg) 152 lb (68.9 kg)     Radiology/Studies:  Dg Chest Port 1 View  Result Date: 06/29/2017 CLINICAL DATA:  Shortness of breath.  Atrial fibrillation. EXAM: PORTABLE CHEST 1 VIEW COMPARISON:  One-view chest x-ray 05/18/2017. FINDINGS: The heart size is exaggerated by low lung volumes. Mild pulmonary vascular congestion is present without frank edema. There are no effusions. Aortic atherosclerosis is again seen. The left lower lobe pulmonary nodule is stable. IMPRESSION: 1.  Low lung volumes and mild pulmonary vascular congestion. 2.  Aortic Atherosclerosis (ICD10-I70.0). Electronically Signed   By: Marin Roberts M.D.   On: 06/29/2017 13:16     Assessment and Recommendation  84 y.o. female with known essential hypertension mixed hyperlipidemia diabetes with atrial fibrillation with rapid ventricular rate 1.  Continue heparin for further risk reduction and stroke with atrial fibrillation 2.  Continue amiodarone to oral medication at 400 mg/day 3.  Continue metoprolol at 100 mg each day and potentially increasing to higher dose as necessary for better heart rate control and possible spontaneous conversion to normal rhythm 4.  No further cardiac diagnostics necessary at this time  Signed, Arnoldo Hooker M.D. FACC

## 2017-07-02 NOTE — Progress Notes (Signed)
[facility] [place] [facility]   PATIENT NAME: Peggy Toppins    MR#:  [id number]  DATE OF BIRTH:  1929-01-04  SUBJECTIVE:  CHIEF COMPLAINT:   Chief Complaint  Patient presents with  . Shortness of Breath  . Palpitations   Still tachycardic on amiodarone drip. SOB and cough  REVIEW OF SYSTEMS:    Review of Systems  Constitutional: Positive for malaise/fatigue. Negative for chills and fever.  HENT: Negative for sore throat.   Eyes: Negative for blurred vision, double vision and pain.  Respiratory: Positive for cough and shortness of breath. Negative for hemoptysis and wheezing.   Cardiovascular: Positive for palpitations and orthopnea. Negative for chest pain and leg swelling.  Gastrointestinal: Negative for abdominal pain, constipation, diarrhea, heartburn, nausea and vomiting.  Genitourinary: Negative for dysuria and hematuria.  Musculoskeletal: Negative for back pain and joint pain.  Skin: Negative for rash.  Neurological: Positive for weakness. Negative for sensory change, speech change, focal weakness and headaches.  Endo/Heme/Allergies: Does not bruise/bleed easily.  Psychiatric/Behavioral: Negative for depression. The patient is not nervous/anxious.     DRUG ALLERGIES:   Allergies  Allergen Reactions  . Diphenhydramine Rash    AGITATION/DELIRIUM  . Metformin Diarrhea  . Oxybutynin Other (See Comments)    dizzy  . Amlodipine Rash  . Ciprofloxacin Rash  . Lisinopril Rash  . Penicillins Rash  . Saxagliptin Rash    VITALS:  Blood pressure 119/71, pulse (!) 134, temperature 98.6 F (37 C), temperature source Oral, resp. rate 16, height 5\' 2"  (1.575 m), weight 68.9 kg (152 lb), SpO2 94 %.  PHYSICAL EXAMINATION:   Physical Exam  GENERAL:  84 y.o.-year-old patient lying in the bed with no acute distress.  EYES: Pupils equal, round, reactive to light and accommodation. No scleral icterus. Extraocular muscles intact.  HEENT: Head atraumatic,  normocephalic. Oropharynx and nasopharynx clear.  NECK:  Supple, no jugular venous distention. No thyroid enlargement, no tenderness.  LUNGS: Left basilar crackles CARDIOVASCULAR: Tachycardia.  Irregularly irregular. ABDOMEN: Soft, nontender, nondistended. Bowel sounds present. No organomegaly or mass.  EXTREMITIES: No cyanosis, clubbing or edema b/l.    NEUROLOGIC: Cranial nerves II through XII are intact. No focal Motor or sensory deficits b/l.   PSYCHIATRIC: The patient is alert and oriented x 3.  SKIN: No obvious rash, lesion, or ulcer.   LABORATORY PANEL:   CBC Recent Labs  Lab 07/01/17 0402  WBC 10.7  HGB 10.9*  HCT 33.2*  PLT 249   ------------------------------------------------------------------------------------------------------------------ Chemistries  Recent Labs  Lab 06/30/17 1226 07/01/17 0402  NA 130* 132*  K 3.9 3.6  CL 100* 103  CO2 21* 20*  GLUCOSE 321* 212*  BUN 25* 20  CREATININE 1.23* 0.99  CALCIUM 8.6* 8.3*  AST 82*  --   ALT 50  --   ALKPHOS 43  --   BILITOT 0.8  --    ------------------------------------------------------------------------------------------------------------------  Cardiac Enzymes Recent Labs  Lab 06/30/17 1226  TROPONINI 0.42*   ------------------------------------------------------------------------------------------------------------------  RADIOLOGY:  No results found.   ASSESSMENT AND PLAN:   * A. Fib with RVR   IV amiodarone drip . Metoprolol increased from 50 to 100mg  daily.   Echocardiogram reviewed   Appreciate cardiology input Will add one dose cardizem today Change to amiodarone PO tomorrow  *Acute on chronic diastolic congestive heart failure.   ON IV lasix Monitor I/os  * Diabetes mellitus   Continue her NPH  sliding scale coverage.  * hypertension   Continue metoprolol  and amlodipine.  * H/O TIA   Continue aspirin, Plavix, atorvastatin.  * Hypothyroidism   TSH normal, continue  levothyroxine.  All the records are reviewed and case discussed with Care Management/Social Worker Management plans discussed with the patient, family and they are in agreement.  CODE STATUS: Partial code  DVT Prophylaxis: SCDs  TOTAL TIME TAKING CARE OF THIS PATIENT: 35 minutes.   POSSIBLE D/C IN 2-3 DAYS, DEPENDING ON CLINICAL CONDITION.  Molinda Bailiff Vernia Teem M.D on 07/02/2017 at 12:03 PM  Between 7am to 6pm - Pager - [phone]  After 6pm go to [email or web address] - password [email or web address] [facility]  [facility]  Office  [phone]  CC: Primary care physician; Lauro Regulus, MD  Note: This dictation was prepared with Dragon dictation along with smaller phrase technology. Any transcriptional errors that result from this process are unintentional.

## 2017-07-02 NOTE — Progress Notes (Signed)
Patients HR in 60's appears to be in Sinus rhythm. Peggy [name] paged.

## 2017-07-02 NOTE — Progress Notes (Addendum)
Inpatient Diabetes Program Recommendations  AACE/ADA: New Consensus Statement on Inpatient Glycemic Control (2015)  Target Ranges:  Prepandial:   less than 140 mg/dL      Peak postprandial:   less than 180 mg/dL (1-2 hours)      Critically ill patients:  140 - 180 mg/dL   Lab Results  Component Value Date   GLUCAP 153 (H) 07/02/2017   HGBA1C 8.8 (H) 12/10/2016    Review of Glycemic Control  Results for Peggy, [name] (MRN [id number]) as of 07/02/2017 11:39  Ref. Range 07/01/2017 07:57 07/01/2017 12:22 07/01/2017 17:12 07/01/2017 20:44 07/02/2017 07:52  Glucose-Capillary Latest Ref Range: 65 - 99 mg/dL 214 (H) 158 (H) 258 (H) 203 (H) 153 (H)    Diabetes history: Type 2 Outpatient Diabetes medications: in medical record Humulin 70/30 32 units at hs- is actually taking 18 units bid pre-meals.   Current orders for Inpatient glycemic control: Novolog 70/30 15 units bid, Novolog 0-9 units tid, Novolog 0-5 units qhs  Inpatient Diabetes Program Recommendations: If patient is placed on NPO status or is unable to eat- 70/30 insulin will put the patient at risk for hypoglycemia.  If either of these situations occur- please change insulin to Levemir 10 units bid and continue Novolog correction as ordered.    Addendum- met with patient regarding insulin timing to clarify medication history- she confirms that she is taking Humulin 70/30 insulin 18 units pre-breakfast and pre-supper.    Gentry Fitz, RN, BA, MHA, CDE Diabetes Coordinator Inpatient Diabetes Program  [phone] (Team Pager) [phone] ([place]) 07/02/2017 11:48 AM

## 2017-07-03 LAB — BASIC METABOLIC PANEL
Anion gap: 12 (ref 5–15)
BUN: 38 mg/dL — AB (ref 6–20)
CO2: 27 mmol/L (ref 22–32)
Calcium: 9.4 mg/dL (ref 8.9–10.3)
Chloride: 98 mmol/L — ABNORMAL LOW (ref 101–111)
Creatinine, Ser: 1.2 mg/dL — ABNORMAL HIGH (ref 0.44–1.00)
GFR, EST AFRICAN AMERICAN: 46 mL/min — AB (ref >60)
GFR, EST NON AFRICAN AMERICAN: 40 mL/min — AB (ref >60)
Glucose, Bld: 189 mg/dL — ABNORMAL HIGH (ref 65–99)
Potassium: 3.4 mmol/L — ABNORMAL LOW (ref 3.5–5.1)
SODIUM: 137 mmol/L (ref 135–145)

## 2017-07-03 LAB — GLUCOSE, CAPILLARY
GLUCOSE-CAPILLARY: 191 mg/dL — AB (ref 65–99)
GLUCOSE-CAPILLARY: 184 mg/dL — AB (ref 65–99)
Glucose-Capillary: 249 mg/dL — ABNORMAL HIGH (ref 65–99)
Glucose-Capillary: 224 mg/dL — ABNORMAL HIGH (ref 65–99)

## 2017-07-03 LAB — MAGNESIUM: Magnesium: 1.9 mg/dL (ref 1.7–2.4)

## 2017-07-03 MED ORDER — INSULIN ASPART PROT & ASPART (70-30 MIX) 100 UNIT/ML ~~LOC~~ SUSP
20.0000 [IU] | Freq: Two times a day (BID) | SUBCUTANEOUS | Status: DC
Start: 2017-07-03 — End: 2017-07-04
  Administered 2017-07-03 – 2017-07-04 (×3): 20 [IU] via SUBCUTANEOUS
  Filled 2017-07-03 (×3): qty 1

## 2017-07-03 MED ORDER — GUAIFENESIN-DM 100-10 MG/5ML PO SYRP
5.0000 mL | ORAL_SOLUTION | ORAL | Status: DC | PRN
  Administered 2017-07-03: 5 mL via ORAL
  Filled 2017-07-03: qty 5

## 2017-07-03 MED ORDER — DOCUSATE SODIUM 100 MG PO CAPS
100.0000 mg | ORAL_CAPSULE | Freq: Two times a day (BID) | ORAL | Status: DC
  Filled 2017-07-03 (×2): qty 1

## 2017-07-03 MED ORDER — AMIODARONE HCL 200 MG PO TABS
400.0000 mg | ORAL_TABLET | Freq: Two times a day (BID) | ORAL | Status: DC
  Administered 2017-07-03 – 2017-07-04 (×3): 400 mg via ORAL
  Filled 2017-07-03 (×3): qty 2

## 2017-07-03 MED ORDER — GUAIFENESIN-DM 100-10 MG/5ML PO SYRP: 5.0000 mL | ORAL_SOLUTION | Freq: Four times a day (QID) | ORAL | 0 refills | Status: DC | PRN

## 2017-07-03 MED ORDER — METOPROLOL TARTRATE 100 MG PO TABS: 100.0000 mg | ORAL_TABLET | Freq: Two times a day (BID) | ORAL | 0 refills | Status: DC

## 2017-07-03 MED ORDER — AMIODARONE HCL 400 MG PO TABS
200.0000 mg | ORAL_TABLET | Freq: Two times a day (BID) | ORAL | 0 refills | Status: DC
Start: 2017-07-03 — End: 2017-09-26

## 2017-07-03 MED ORDER — POTASSIUM CHLORIDE CRYS ER 20 MEQ PO TBCR
40.0000 meq | EXTENDED_RELEASE_TABLET | Freq: Once | ORAL | Status: AC
  Administered 2017-07-03: 40 meq via ORAL
  Filled 2017-07-03: qty 2

## 2017-07-03 NOTE — Progress Notes (Signed)
[employer] - [place] at [facility]   PATIENT NAME: Peggy [name]    MR#:  [id number]  DATE OF BIRTH:  Dec 16, 1928  SUBJECTIVE:  CHIEF COMPLAINT:   Chief Complaint  Patient presents with  . Shortness of Breath  . Palpitations   Heart rate was in 50-70 overnight.  Today it is again back up to 130s.  Cardiology has seen patient. Shortness of breath has resolved.  Worked with physical therapy.  Off oxygen.  REVIEW OF SYSTEMS:    Review of Systems  Constitutional: Positive for malaise/fatigue. Negative for chills and fever.  HENT: Negative for sore throat.   Eyes: Negative for blurred vision, double vision and pain.  Respiratory: Positive for cough and shortness of breath. Negative for hemoptysis and wheezing.   Cardiovascular: Positive for palpitations and orthopnea. Negative for chest pain and leg swelling.  Gastrointestinal: Negative for abdominal pain, constipation, diarrhea, heartburn, nausea and vomiting.  Genitourinary: Negative for dysuria and hematuria.  Musculoskeletal: Negative for back pain and joint pain.  Skin: Negative for rash.  Neurological: Positive for weakness. Negative for sensory change, speech change, focal weakness and headaches.  Endo/Heme/Allergies: Does not bruise/bleed easily.  Psychiatric/Behavioral: Negative for depression. The patient is not nervous/anxious.     DRUG ALLERGIES:   Allergies  Allergen Reactions  . Diphenhydramine Rash    AGITATION/DELIRIUM  . Metformin Diarrhea  . Oxybutynin Other (See Comments)    dizzy  . Amlodipine Rash  . Ciprofloxacin Rash  . Lisinopril Rash  . Penicillins Rash  . Saxagliptin Rash    VITALS:  Blood pressure 114/63, pulse (!) 131, temperature 97.8 F (36.6 C), temperature source Oral, resp. rate 18, height 5\' 2"  (1.575 m), weight 68.4 kg (150 lb 14.4 oz), SpO2 98 %.  PHYSICAL EXAMINATION:   Physical Exam  GENERAL:  84 y.o.-year-old patient lying in the bed with no acute distress.   EYES: Pupils equal, round, reactive to light and accommodation. No scleral icterus. Extraocular muscles intact.  HEENT: Head atraumatic, normocephalic. Oropharynx and nasopharynx clear.  NECK:  Supple, no jugular venous distention. No thyroid enlargement, no tenderness.  LUNGS: Left basilar crackles CARDIOVASCULAR: Tachycardia.  Irregularly irregular. ABDOMEN: Soft, nontender, nondistended. Bowel sounds present. No organomegaly or mass.  EXTREMITIES: No cyanosis, clubbing or edema b/l.    NEUROLOGIC: Cranial nerves II through XII are intact. No focal Motor or sensory deficits b/l.   PSYCHIATRIC: The patient is alert and oriented x 3.  SKIN: No obvious rash, lesion, or ulcer.   LABORATORY PANEL:   CBC Recent Labs  Lab 07/01/17 0402  WBC 10.7  HGB 10.9*  HCT 33.2*  PLT 249   ------------------------------------------------------------------------------------------------------------------ Chemistries  Recent Labs  Lab 06/30/17 1226  07/03/17 0556  NA 130*   < > 137  K 3.9   < > 3.4*  CL 100*   < > 98*  CO2 21*   < > 27  GLUCOSE 321*   < > 189*  BUN 25*   < > 38*  CREATININE 1.23*   < > 1.20*  CALCIUM 8.6*   < > 9.4  MG  --   --  1.9  AST 82*  --   --   ALT 50  --   --   ALKPHOS 43  --   --   BILITOT 0.8  --   --    < > = values in this interval not displayed.   ------------------------------------------------------------------------------------------------------------------  Cardiac Enzymes Recent Labs  Lab 06/30/17 1226  TROPONINI 0.42*   ------------------------------------------------------------------------------------------------------------------  RADIOLOGY:  No results found.   ASSESSMENT AND PLAN:   * A. Fib with RVR Change amiodarone from IV to PO Increase metoprolol to 100 BID from QD. HR still in 130s Appreciate cardiology input Not on anticoagulation due to prior GI bleed.  *Acute on chronic diastolic congestive heart failure.    Resolved Stopped lasix  * Diabetes mellitus   Continue her NPH  sliding scale coverage.  * hypertension   Continue metoprolol and amlodipine.  * H/O TIA   Continue aspirin, Plavix, atorvastatin.  * Hypothyroidism   TSH normal, continue levothyroxine.  All the records are reviewed and case discussed with Care Management/Social Worker Management plans discussed with the patient, family and they are in agreement.  CODE STATUS: Partial code  DVT Prophylaxis: SCDs  TOTAL TIME TAKING CARE OF THIS PATIENT: 35 minutes.   POSSIBLE D/C IN 1-2 DAYS, DEPENDING ON CLINICAL CONDITION.  Molinda Bailiff [name] M.D on 07/03/2017 at 11:16 AM  Between 7am to 6pm - Pager - [phone]  After 6pm go to [email or web address] - password [email or web address]  [facility] [place] [facility]  Office  [phone]  CC: Primary care physician; Lauro Regulus, MD  Note: This dictation was prepared with Dragon dictation along with smaller phrase technology. Any transcriptional errors that result from this process are unintentional.

## 2017-07-03 NOTE — Discharge Instructions (Signed)
Resume diet and activity as before ° ° °

## 2017-07-03 NOTE — Progress Notes (Signed)
[facility] Cardiology [facility] Encounter Note  Patient: Peggy [name] / Admit Date: 06/30/2017 / Date of Encounter: 07/03/2017, 8:23 AM   Subjective: Patient feels much better today.  No evidence of breath cough or congestion.  Heart rate better controlled today but continues to be atrial fibrillation despite higher doses of metoprolol and amiodarone orally no evidence of angina or heart failure.  Patient breathing well without evidence of need for supplemental oxygen  Review of Systems: Positive for: Weakness Negative for: Vision change, hearing change, syncope, dizziness, nausea, vomiting,diarrhea, bloody stool, stomach pain, cough, congestion, diaphoresis, urinary frequency, urinary pain,skin lesions, skin rashes Others previously listed  Objective: Telemetry: Atrial fibrillation with wide-complex EKG with better heart rate control Physical Exam: Blood pressure 114/63, pulse (!) 103, temperature 97.8 F (36.6 C), temperature source Oral, resp. rate 18, height 5\' 2"  (1.575 m), weight 150 lb 14.4 oz (68.4 kg), SpO2 98 %. Body mass index is 27.6 kg/m. General: Well developed, well nourished, in no acute distress. Head: Normocephalic, atraumatic, sclera non-icteric, no xanthomas, nares are without discharge. Neck: No apparent masses Lungs: Normal respirations with no wheezes, no rhonchi, no rales , few crackles   Heart: Irregular rate and rhythm, normal S1 S2, no murmur, no rub, no gallop, PMI is normal size and placement, carotid upstroke normal without bruit, jugular venous pressure normal Abdomen: Soft, non-tender, non-distended with normoactive bowel sounds. No hepatosplenomegaly. Abdominal aorta is normal size without bruit Extremities: No edema, no clubbing, no cyanosis, no ulcers,  Peripheral: 2+ radial, 2+ femoral, 2+ dorsal pedal pulses Neuro: Alert and oriented. Moves all extremities spontaneously. Psych:  Responds to questions appropriately with a normal  affect.   Intake/Output Summary (Last 24 hours) at 07/03/2017 0823 Last data filed at 07/03/2017 0104 Gross per 24 hour  Intake 960 ml  Output 700 ml  Net 260 ml    Inpatient Medications:  . amiodarone  400 mg Oral BID  . aspirin EC  81 mg Oral Daily  . atorvastatin  40 mg Oral q1800  . fenofibrate  160 mg Oral Daily  . fluticasone  1 spray Each Nare Daily  . heparin  5,000 Units Subcutaneous Q8H  . insulin aspart  0-5 Units Subcutaneous QHS  . insulin aspart  0-9 Units Subcutaneous TID WC  . insulin aspart protamine- aspart  20 Units Subcutaneous BID WC  . levothyroxine  50 mcg Oral QAC breakfast  . metoprolol tartrate  100 mg Oral BID  . multivitamin with minerals  1 tablet Oral Daily  . protein supplement shake  11 oz Oral BID BM   Infusions:    Labs: Recent Labs    07/01/17 0402 07/03/17 0556  NA 132* 137  K 3.6 3.4*  CL 103 98*  CO2 20* 27  GLUCOSE 212* 189*  BUN 20 38*  CREATININE 0.99 1.20*  CALCIUM 8.3* 9.4  MG  --  1.9   Recent Labs    06/30/17 1226  AST 82*  ALT 50  ALKPHOS 43  BILITOT 0.8  PROT 6.3*  ALBUMIN 3.1*   Recent Labs    06/30/17 1226 07/01/17 0402  WBC 11.4* 10.7  HGB 12.0 10.9*  HCT 36.8 33.2*  MCV 81.3 80.9  PLT 245 249   Recent Labs    06/30/17 1226  TROPONINI 0.42*   Invalid input(s): POCBNP No results for input(s): HGBA1C in the last 72 hours.   Weights: Filed Weights   06/30/17 1223 06/30/17 1226 07/02/17 1832  Weight: 152 lb (68.9  kg) 152 lb (68.9 kg) 150 lb 14.4 oz (68.4 kg)     Radiology/Studies:  Dg Chest Port 1 View  Result Date: 06/29/2017 CLINICAL DATA:  Shortness of breath.  Atrial fibrillation. EXAM: PORTABLE CHEST 1 VIEW COMPARISON:  One-view chest x-ray 05/18/2017. FINDINGS: The heart size is exaggerated by low lung volumes. Mild pulmonary vascular congestion is present without frank edema. There are no effusions. Aortic atherosclerosis is again seen. The left lower lobe pulmonary nodule is stable.  IMPRESSION: 1. Low lung volumes and mild pulmonary vascular congestion. 2.  Aortic Atherosclerosis (ICD10-I70.0). Electronically Signed   By: Marin Roberts M.D.   On: 06/29/2017 13:16     Assessment and Recommendation  84 y.o. female with known essential hypertension mixed hyperlipidemia diabetes with atrial fibrillation with rapid ventricular rate 1.  Continue heparin for further risk reduction and stroke with atrial fibrillation and transition to Eliquis upon discharge to home for further risk reduction and stroke with atrial fibrillation and possible spontaneous conversion to normal rhythm or in preparation for possible electrical cardioversion in the next 2 weeks 2.  Continue amiodarone to oral medication at 400 mg/day 3.  Continue metoprolol and increase to 150 mg each day and potentially I  for better heart rate control and possible spontaneous conversion to normal rhythm 4.  No further cardiac diagnostics necessary at this time 5.  Begin ambulation today and possible discharge to home with medication management changes listed above with follow-up next week for further adjustments and possible preparation for electrical cardioversion to normal rhythm  Signed, Arnoldo Hooker M.D. FACC

## 2017-07-03 NOTE — Care Management Important Message (Signed)
Important Message  Patient Details  Name: YASUKO POZO MRN: [id number] Date of Birth: 1929/05/04   Medicare Important Message Given:  Yes Signed IM notice given    Eber Hong, RN 07/03/2017, 5:59 PM

## 2017-07-03 NOTE — Progress Notes (Signed)
Physical Therapy Evaluation Patient Details Name: Peggy [name] MRN: [id number] DOB: 1928-08-05 Today's Date: 07/03/2017   History of Present Illness  Peggy [name]  is a 84 y.o. female with a known history of A. Fib, diabetes, hyperlipidemia, hypertension, TIA. 3-4 days prior to arrival has repeated feeling of dizziness, palpitation and chest tightness. She came to emergency room, was noted to have A. Fib, TSH was normal, chest x-ray was negative and troponin was slightly elevated but stable, so sent home after starting on metoprolol. She started the same feeling again, so came back to emergency room. ER physician spoke to her primary cardiologist, suggested to admit her with amiodarone IV drip, ER physician has already ordered that. Patient was on Xarelto in the past for her A. Fib, but she had GI bleed and they had to stop that. She is now admitted for Afib with RVR and acute on chronic CHF. Cardiologist has consulted and PT has been ordered to assess tolerance for ambulation.   Clinical Impression  Pt admitted with above diagnosis. Pt currently with functional limitations due to the deficits listed below (see PT Problem List).  Modified independent with bed mobility. Fair speed with transfers and good stability in standing. Safe hand placement demonstrated. Pt able to ambulate partially around RN station with therapist with rolling walker. Good stability with UE support on walker. SaO2 at or above 95% on room air and pt denies DOE. HR continuously monitored. HR around 120 at rest in bed. During ambulation it fluctuates between 115 and 150 but mostly remains in 130s. Pt denies SOB, chest pain, or palpitations with ambulation. Negative Rhomberg. Single leg balance is 2s on LLE, 4s on RLE. History of 3 falls in the last 12 months. Notified MD and RN of ambulatory vitals. Pt would benefit from OP PT to work on her balance. She was performing OP PT for balance prior to the holidays per patient report at [facility]  ENT. Pt will benefit from PT services to address deficits in strength, balance, and mobility in order to return to full function at home.      Follow Up Recommendations Outpatient PT;Other (comment)(For balance/strengthening)    Equipment Recommendations  None recommended by PT(Use rolling walker at discharge)    Recommendations for Other Services       Precautions / Restrictions Precautions Precautions: Fall Restrictions Weight Bearing Restrictions: No      Mobility  Bed Mobility Overal bed mobility: Modified Independent             General bed mobility comments: Good speed/sequencing  Transfers Overall transfer level: Needs assistance Equipment used: Rolling walker (2 wheeled) Transfers: Sit to/from Stand Sit to Stand: Supervision         General transfer comment: Fair speed and good stability in standing. Safe hand placement  Ambulation/Gait Ambulation/Gait assistance: Min guard Ambulation Distance (Feet): 120 Feet Assistive device: Rolling walker (2 wheeled)   Gait velocity: Decreased but functional for limited community mobility   General Gait Details: Pt able to ambulate partially around RN station with therapist with rolling walker. Good stability with UE support on walker. SaO2 at or above 95% on room air and pt denies DOE. HR continuously monitored. It is around 120 at rest in bed. During ambulation it fluctuates between 115 and 150 but mostly remains in 130s. Pt denies SOB, chest pain, or palpitations with ambulation.   Stairs            Wheelchair Mobility    Modified Rankin (  Stroke Patients Only)       Balance Overall balance assessment: Needs assistance Sitting-balance support: No upper extremity supported Sitting balance-Leahy Scale: Good     Standing balance support: No upper extremity supported Standing balance-Leahy Scale: Fair Standing balance comment: Negative Rhomberg. Single leg balance is 2s on LLE, 4s on RLE. History of 3  falls in the last 12 months.                              Pertinent Vitals/Pain Pain Assessment: No/denies pain    Home Living Family/patient expects to be discharged to:: Private residence Living Arrangements: Children(Daughter) Available Help at Discharge: Family Type of Home: House Home Access: Stairs to enter Entrance Stairs-Rails: Left Entrance Stairs-Number of Steps: 4 Home Layout: One level Home Equipment: Environmental consultant - 2 wheels;Walker - 4 wheels;Cane - single point;Bedside commode;Shower seat;Grab bars - tub/shower;Grab bars - toilet;Wheelchair - manual      Prior Function Level of Independence: Independent         Comments: Indep with ADLs, household and community mobility with intermittent use of single point cane. She drives. Pt reports 3 falls in the last 12 months.     Hand Dominance   Dominant Hand: Right    Extremity/Trunk Assessment   Upper Extremity Assessment Upper Extremity Assessment: Overall WFL for tasks assessed    Lower Extremity Assessment Lower Extremity Assessment: Overall WFL for tasks assessed       Communication   Communication: No difficulties  Cognition Arousal/Alertness: Awake/alert Behavior During Therapy: WFL for tasks assessed/performed Overall Cognitive Status: Within Functional Limits for tasks assessed                                        General Comments      Exercises     Assessment/Plan    PT Assessment Patient needs continued PT services  PT Problem List Decreased strength;Decreased activity tolerance;Decreased balance;Decreased mobility       PT Treatment Interventions DME instruction;Gait training;Stair training;Functional mobility training;Balance training;Neuromuscular re-education;Therapeutic exercise;Therapeutic activities    PT Goals (Current goals can be found in the Care Plan section)  Acute Rehab PT Goals Patient Stated Goal: Return to prior function at home PT Goal  Formulation: With patient Time For Goal Achievement: 07/17/17 Potential to Achieve Goals: Good    Frequency Min 2X/week   Barriers to discharge Decreased caregiver support At home alone during daytime hours    Co-evaluation               AM-PAC PT "6 Clicks" Daily Activity  Outcome Measure Difficulty turning over in bed (including adjusting bedclothes, sheets and blankets)?: None Difficulty moving from lying on back to sitting on the side of the bed? : None Difficulty sitting down on and standing up from a chair with arms (e.g., wheelchair, bedside commode, etc,.)?: None Help needed moving to and from a bed to chair (including a wheelchair)?: None Help needed walking in hospital room?: None Help needed climbing 3-5 steps with a railing? : A Little 6 Click Score: 23    End of Session Equipment Utilized During Treatment: Gait belt Activity Tolerance: Patient tolerated treatment well Patient left: in bed;with call bell/phone within reach;with bed alarm set Nurse Communication: Mobility status PT Visit Diagnosis: Unsteadiness on feet (R26.81);History of falling (Z91.81);Difficulty in walking, not elsewhere classified (R26.2)  Time: 1610-9604 PT Time Calculation (min) (ACUTE ONLY): 24 min   Charges:   PT Evaluation $PT Eval Low Complexity: 1 Low PT Treatments $Therapeutic Activity: 8-22 mins   PT G Codes:        Sharalyn Ink Gigi Onstad PT, DPT    Ara Mano 07/03/2017, 10:14 AM

## 2017-07-03 NOTE — Care Management (Signed)
Provided patient with 30 day coupon for eliquis as it is thought she will discharge with this med.  No other discharge needs identified by members of the care team

## 2017-07-03 NOTE — Progress Notes (Signed)
Pt HR down to low 60's-58 ambulated around nurses station HR increased to high 70's, room air, No complaints.

## 2017-07-03 NOTE — Progress Notes (Signed)
Inpatient Diabetes Program Recommendations  AACE/ADA: New Consensus Statement on Inpatient Glycemic Control (2015)  Target Ranges:  Prepandial:   less than 140 mg/dL      Peak postprandial:   less than 180 mg/dL (1-2 hours)      Critically ill patients:  140 - 180 mg/dL   Lab Results  Component Value Date   GLUCAP 249 (H) 07/03/2017   HGBA1C 8.8 (H) 12/10/2016    Review of Glycemic Control  Results for Peggy [name], Peggy [name] (MRN [id number]) as of 07/03/2017 09:46  Ref. Range 07/02/2017 07:52 07/02/2017 12:01 07/02/2017 16:40 07/02/2017 21:55 07/03/2017 07:48  Glucose-Capillary Latest Ref Range: 65 - 99 mg/dL 458 (H) 592 (H) 924 (H) 213 (H) 249 (H)    Diabetes history: Type 2 Outpatient Diabetes medications: in medical record Humulin 70/30 18 units bid pre-meals.   Current orders for Inpatient glycemic control: Novolog 70/30 20 units bid, Novolog 0-9 units tid, Novolog 0-5 units qhs  Inpatient Diabetes Program Recommendations: noted- 70/30 insulin increased to 20 units bid and started last night- anticipate improvements today with current doses.  Susette Racer, RN, BA, MHA, CDE Diabetes Coordinator Inpatient Diabetes Program  [phone] (Team Pager) [phone] [facility] Office) 07/03/2017 10:02 AM

## 2017-07-04 LAB — GLUCOSE, CAPILLARY: Glucose-Capillary: 216 mg/dL — ABNORMAL HIGH (ref 65–99)

## 2017-07-04 MED ORDER — APIXABAN 2.5 MG PO TABS: 2.5000 mg | ORAL_TABLET | Freq: Two times a day (BID) | ORAL | 0 refills | Status: DC

## 2017-07-04 MED ORDER — APIXABAN 2.5 MG PO TABS
2.5000 mg | ORAL_TABLET | Freq: Two times a day (BID) | ORAL | Status: DC
  Administered 2017-07-04: 2.5 mg via ORAL
  Filled 2017-07-04: qty 1

## 2017-07-04 NOTE — Discharge Summary (Signed)
[facility] - [place] at [facility]   PATIENT NAME: Peggy [name]    MR#:  [id number]  DATE OF BIRTH:  [birth year]  DATE OF ADMISSION:  06/30/2017 ADMITTING PHYSICIAN: Altamese Dilling, MD  DATE OF DISCHARGE: No discharge date for patient encounter.  PRIMARY CARE PHYSICIAN: Lauro Regulus, MD    ADMISSION DIAGNOSIS:  Hyponatremia [E87.1] Hyperglycemia [R73.9] Demand ischemia (HCC) [I24.8] Atrial fibrillation with RVR (HCC) [I48.91]  DISCHARGE DIAGNOSIS:  Principal Problem:   Atrial fibrillation with RVR (HCC)   SECONDARY DIAGNOSIS:   Past Medical History:  Diagnosis Date  . A-fib (HCC)   . Allergy   . Arthritis   . Diabetes mellitus without complication (HCC)   . GERD (gastroesophageal reflux disease)   . Hyperlipidemia   . Hypertension   . TIA (transient ischemic attack)     HOSPITAL COURSE:  * A. Fib with RVR Controlled on current regiment Change amiodarone from IV to PO Increased metoprolol to 100 BID from QD. Cardiology did see patient while in house-started on Eliquis, noted history of prior GI bleed, plans are to follow-up in 1 week with cardiology-possible cardioversion in 2 weeks status post discharge.  *Acute on chronic diastolic congestive heart failure.   Resolved Stopped lasix  * Diabetes mellitus Continued her NPH  sliding scale coverage.  * hypertension Continued metoprolol and amlodipine.  * H/O TIA Continued aspirin, Plavix, atorvastatin.  * Hypothyroidism TSH normal, continued levothyroxine.     DISCHARGE CONDITIONS:  On day of discharge patient is afebrile, he dynamically stable, tolerating diet, ready for discharge home with appropriate follow-up with cardiology in 1 week for reevaluation, possible cardioversion in 2 weeks, for more specific details please see chart  CONSULTS OBTAINED:  Treatment Team:  Lamar Blinks, MD Marcina Millard, MD Amaurie Wandel, Evelena Asa, MD  DRUG  ALLERGIES:   Allergies  Allergen Reactions  . Diphenhydramine Rash    AGITATION/DELIRIUM  . Metformin Diarrhea  . Oxybutynin Other (See Comments)    dizzy  . Amlodipine Rash  . Ciprofloxacin Rash  . Lisinopril Rash  . Penicillins Rash  . Saxagliptin Rash    DISCHARGE MEDICATIONS:   Allergies as of 07/04/2017      Reactions   Diphenhydramine Rash   AGITATION/DELIRIUM   Metformin Diarrhea   Oxybutynin Other (See Comments)   dizzy   Amlodipine Rash   Ciprofloxacin Rash   Lisinopril Rash   Penicillins Rash   Saxagliptin Rash      Medication List    STOP taking these medications   amLODipine 5 MG tablet Commonly known as:  NORVASC   metoprolol succinate 50 MG 24 hr tablet Commonly known as:  TOPROL-XL     TAKE these medications   amiodarone 400 MG tablet Commonly known as:  PACERONE Take 0.5 tablets (200 mg total) by mouth 2 (two) times daily.   apixaban 2.5 MG Tabs tablet Commonly known as:  ELIQUIS Take 1 tablet (2.5 mg total) by mouth 2 (two) times daily.   atorvastatin 40 MG tablet Commonly known as:  LIPITOR Take 1 tablet (40 mg total) by mouth daily at 6 PM.   clopidogrel 75 MG tablet Commonly known as:  PLAVIX Take 1 tablet (75 mg total) by mouth daily.   fenofibrate 145 MG tablet Commonly known as:  TRICOR Take 1 tablet (145 mg total) by mouth daily.   fluocinonide ointment 0.05 % Commonly known as:  LIDEX Apply 1 application topically daily as needed.   fluticasone 50 MCG/ACT  nasal spray Commonly known as:  FLONASE USE ONE SPRAY IN EACH NOSTRIL DAILY   guaiFENesin-dextromethorphan 100-10 MG/5ML syrup Commonly known as:  ROBITUSSIN DM Take 5 mLs by mouth every 6 (six) hours as needed for cough.   HUMULIN 70/30 KWIKPEN (70-30) 100 UNIT/ML PEN Generic drug:  Insulin Isophane & Regular Human Inject 32 Units into the skin at bedtime.   HYDROcodone-acetaminophen 5-325 MG tablet Commonly known as:  NORCO/VICODIN Take 1 tablet by mouth 2  (two) times daily. As needed for chronic low back pain   levothyroxine 50 MCG tablet Commonly known as:  SYNTHROID, LEVOTHROID Take 1 tablet (50 mcg total) by mouth daily. PATIENT NEEDS TO SCHEDULE OFFICE VISIT FOR FOLLOW UP   metoprolol tartrate 100 MG tablet Commonly known as:  LOPRESSOR Take 1 tablet (100 mg total) by mouth 2 (two) times daily.   multivitamin tablet Take 1 tablet by mouth daily.   ondansetron 4 MG disintegrating tablet Commonly known as:  ZOFRAN ODT Take 1 tablet (4 mg total) by mouth every 8 (eight) hours as needed for nausea or vomiting.   Potassium 99 MG Tabs Take 1 tablet by mouth daily.   rOPINIRole 0.25 MG tablet Commonly known as:  REQUIP Take 1 tablet (0.25 mg total) by mouth at bedtime.   VENTOLIN HFA 108 (90 Base) MCG/ACT inhaler Generic drug:  albuterol Inhale 2 puffs into the lungs every 6 (six) hours as needed.        DISCHARGE INSTRUCTIONS:   If you experience worsening of your admission symptoms, develop shortness of breath, life threatening emergency, suicidal or homicidal thoughts you must seek medical attention immediately by calling 911 or calling your MD immediately  if symptoms less severe.  You Must read complete instructions/literature along with all the possible adverse reactions/side effects for all the Medicines you take and that have been prescribed to you. Take any new Medicines after you have completely understood and accept all the possible adverse reactions/side effects.   Please note  You were cared for by a hospitalist during your hospital stay. If you have any questions about your discharge medications or the care you received while you were in the hospital after you are discharged, you can call the unit and asked to speak with the hospitalist on call if the hospitalist that took care of you is not available. Once you are discharged, your primary care physician will handle any further medical issues. Please note that NO  REFILLS for any discharge medications will be authorized once you are discharged, as it is imperative that you return to your primary care physician (or establish a relationship with a primary care physician if you do not have one) for your aftercare needs so that they can reassess your need for medications and monitor your lab values.    Today   CHIEF COMPLAINT:   Chief Complaint  Patient presents with  . Shortness of Breath  . Palpitations    HISTORY OF PRESENT ILLNESS:  84 y.o. female with a known history of A. Fib, diabetes, hyperlipidemia, hypertension, TIA- last 3-4 days has repeated feeling of dizziness, palpitation and chest tightness. She came to emergency room yesterday, was noted to have A. Fib, TSH was normal, chest x-ray was negative and troponin was slightly elevated but stable, so sent home after starting on metoprolol. Today morning she started the same feeling again, so came back to emergency room. ER physician spoke to her primary cardiologist, suggested to admit her with amiodarone IV drip, ER  physician has already ordered that. Patient was on Xarelto in the past for her A. Fib, but she had GI bleed and they had to stop that.     VITAL SIGNS:  Blood pressure (!) 112/53, pulse 63, temperature (!) 97.4 F (36.3 C), temperature source Oral, resp. rate 14, height 5\' 2"  (1.575 m), weight 69.3 kg (152 lb 12.8 oz), SpO2 98 %.  I/O:    Intake/Output Summary (Last 24 hours) at 07/04/2017 1036 Last data filed at 07/04/2017 1013 Gross per 24 hour  Intake 720 ml  Output -  Net 720 ml    PHYSICAL EXAMINATION:  GENERAL:  84 y.o.-year-old patient lying in the bed with no acute distress.  EYES: Pupils equal, round, reactive to light and accommodation. No scleral icterus. Extraocular muscles intact.  HEENT: Head atraumatic, normocephalic. Oropharynx and nasopharynx clear.  NECK:  Supple, no jugular venous distention. No thyroid enlargement, no tenderness.  LUNGS: Normal breath  sounds bilaterally, no wheezing, rales,rhonchi or crepitation. No use of accessory muscles of respiration.  CARDIOVASCULAR: S1, S2 normal. No murmurs, rubs, or gallops.  ABDOMEN: Soft, non-tender, non-distended. Bowel sounds present. No organomegaly or mass.  EXTREMITIES: No pedal edema, cyanosis, or clubbing.  NEUROLOGIC: Cranial nerves II through XII are intact. Muscle strength 5/5 in all extremities. Sensation intact. Gait not checked.  PSYCHIATRIC: The patient is alert and oriented x 3.  SKIN: No obvious rash, lesion, or ulcer.   DATA REVIEW:   CBC Recent Labs  Lab 07/01/17 0402  WBC 10.7  HGB 10.9*  HCT 33.2*  PLT 249    Chemistries  Recent Labs  Lab 06/30/17 1226  07/03/17 0556  NA 130*   < > 137  K 3.9   < > 3.4*  CL 100*   < > 98*  CO2 21*   < > 27  GLUCOSE 321*   < > 189*  BUN 25*   < > 38*  CREATININE 1.23*   < > 1.20*  CALCIUM 8.6*   < > 9.4  MG  --   --  1.9  AST 82*  --   --   ALT 50  --   --   ALKPHOS 43  --   --   BILITOT 0.8  --   --    < > = values in this interval not displayed.    Cardiac Enzymes Recent Labs  Lab 06/30/17 1226  TROPONINI 0.42*    Microbiology Results  No results found for this or any previous visit.  RADIOLOGY:  No results found.  EKG:   Orders placed or performed during the hospital encounter of 06/30/17  . EKG 12-Lead  . EKG 12-Lead  . ED EKG  . ED EKG  . EKG 12-Lead  . EKG 12-Lead      Management plans discussed with the patient, family and they are in agreement.  CODE STATUS:     Code Status Orders  (From admission, onward)        Start     Ordered   07/01/17 1248  Full code  Continuous     07/01/17 1247    Code Status History    Date Active Date Inactive Code Status Order ID Comments User Context   06/30/2017 21:00 07/01/2017 12:47 Partial Code [id number]  Altamese Dilling, MD Inpatient   12/09/2016 21:28 12/10/2016 21:12 DNR [id number]  Milagros Loll, MD ED      TOTAL TIME TAKING CARE OF  THIS PATIENT: 45 minutes.  Evelena Asa Eder Macek M.D on 07/04/2017 at 10:36 AM  Between 7am to 6pm - Pager - [phone]  After 6pm go to [email or web address] - Social research officer, government  [facility] [place] [facility]  Office  [phone]  CC: Primary care physician; Lauro Regulus, MD   Note: This dictation was prepared with Dragon dictation along with smaller phrase technology. Any transcriptional errors that result from this process are unintentional.

## 2017-07-04 NOTE — Progress Notes (Signed)
Pt to be discharged to home today. Iv and tele removed. disch instructions and prescrips given to pt and daughter to their understanding. disch via w.c. Family to transport

## 2017-07-07 DIAGNOSIS — Z8719 Personal history of other diseases of the digestive system: Secondary | ICD-10-CM | POA: Insufficient documentation

## 2017-08-03 ENCOUNTER — Other Ambulatory Visit: Payer: Self-pay

## 2017-08-03 ENCOUNTER — Emergency Department
Admission: EM | Admit: 2017-08-03 | Discharge: 2017-08-03 | Disposition: A | Discharge status: Home / Self Care | Payer: Medicare Other | Attending: Emergency Medicine | Admitting: Emergency Medicine

## 2017-08-03 ENCOUNTER — Encounter: Payer: Self-pay | Admitting: Emergency Medicine

## 2017-08-03 ENCOUNTER — Emergency Department: Payer: Medicare Other

## 2017-08-03 DIAGNOSIS — E039 Hypothyroidism, unspecified: Secondary | ICD-10-CM | POA: Diagnosis not present

## 2017-08-03 DIAGNOSIS — I252 Old myocardial infarction: Secondary | ICD-10-CM | POA: Diagnosis not present

## 2017-08-03 DIAGNOSIS — Z7902 Long term (current) use of antithrombotics/antiplatelets: Secondary | ICD-10-CM | POA: Diagnosis not present

## 2017-08-03 DIAGNOSIS — I10 Essential (primary) hypertension: Secondary | ICD-10-CM | POA: Diagnosis not present

## 2017-08-03 DIAGNOSIS — E119 Type 2 diabetes mellitus without complications: Secondary | ICD-10-CM | POA: Diagnosis not present

## 2017-08-03 DIAGNOSIS — Z7901 Long term (current) use of anticoagulants: Secondary | ICD-10-CM | POA: Insufficient documentation

## 2017-08-03 DIAGNOSIS — J209 Acute bronchitis, unspecified: Secondary | ICD-10-CM | POA: Diagnosis not present

## 2017-08-03 DIAGNOSIS — J40 Bronchitis, not specified as acute or chronic: Secondary | ICD-10-CM

## 2017-08-03 DIAGNOSIS — R002 Palpitations: Secondary | ICD-10-CM | POA: Diagnosis present

## 2017-08-03 LAB — COMPREHENSIVE METABOLIC PANEL
ALT: 27 U/L (ref 14–54)
AST: 31 U/L (ref 15–41)
Albumin: 4.1 g/dL (ref 3.5–5.0)
Alkaline Phosphatase: 37 U/L — ABNORMAL LOW (ref 38–126)
Anion gap: 10 (ref 5–15)
BUN: 25 mg/dL — ABNORMAL HIGH (ref 6–20)
CO2: 24 mmol/L (ref 22–32)
Calcium: 10 mg/dL (ref 8.9–10.3)
Chloride: 105 mmol/L (ref 101–111)
Creatinine, Ser: 1.23 mg/dL — ABNORMAL HIGH (ref 0.44–1.00)
GFR calc Af Amer: 45 mL/min — ABNORMAL LOW (ref >60)
GFR calc non Af Amer: 39 mL/min — ABNORMAL LOW (ref >60)
Glucose, Bld: 179 mg/dL — ABNORMAL HIGH (ref 65–99)
Potassium: 4.2 mmol/L (ref 3.5–5.1)
Sodium: 139 mmol/L (ref 135–145)
Total Bilirubin: 0.6 mg/dL (ref 0.3–1.2)
Total Protein: 7.4 g/dL (ref 6.5–8.1)

## 2017-08-03 LAB — CBC
HEMATOCRIT: 41.5 % (ref 35.0–47.0)
Hemoglobin: 13.5 g/dL (ref 12.0–16.0)
MCH: 27 pg (ref 26.0–34.0)
MCHC: 32.6 g/dL (ref 32.0–36.0)
MCV: 82.8 fL (ref 80.0–100.0)
Platelets: 286 10*3/uL (ref 150–440)
RBC: 5.01 MIL/uL (ref 3.80–5.20)
RDW: 16.1 % — AB (ref 11.5–14.5)
WBC: 10 10*3/uL (ref 3.6–11.0)

## 2017-08-03 LAB — TROPONIN I: Troponin I: 0.44 ng/mL (ref <0.03)

## 2017-08-03 LAB — MAGNESIUM: MAGNESIUM: 1.7 mg/dL (ref 1.7–2.4)

## 2017-08-03 MED ORDER — IPRATROPIUM-ALBUTEROL 0.5-2.5 (3) MG/3ML IN SOLN
3.0000 mL | Freq: Once | RESPIRATORY_TRACT | Status: AC
  Administered 2017-08-03: 3 mL via RESPIRATORY_TRACT
  Filled 2017-08-03: qty 3

## 2017-08-03 NOTE — ED Provider Notes (Signed)
[facility] Emergency Department Provider Note  ____________________________________________  Time seen: Approximately 5:57 PM  I have reviewed the triage vital signs and the nursing notes.   HISTORY  Chief Complaint Palpitations; Cough; and Shortness of Breath   HPI Peggy [name] is a 84 y.o. female with a history of paroxysmal atrial fibrillation on amiodarone and Eliquis, diabetes, hypertension, hyperlipidemia, TIA on Plavix who presents for evaluation of palpitations and cough. Patient reports one week of dry cough.was initially placed on doxycycline however yesterday she went to urgent care and they switch her to Cefdinir for sinusitis and bronchitis. She has been using her albuterol inhaler 4 times a day. She reports the shortness of breath has been getting progressively worse and last night she was having palpitations. She was concerned that she was in A. fib. She went to her primary care doctor today and he was found to be in A. fib with RVR and sent to the emergency room for evaluation. Patient denies chest pain, fever, chills, nausea, vomiting. She does have diarrhea however that is chronic for her. Patient reports that the shortness of breath is worse with minimal exertion, constant, and moderate at rest, she denies orthopnea. She reports hearing rattling and wheezing sounds coming from her chest.she has no personal or family history of blood clots, no recent travel or immobilization, no leg pain or swelling, no hemoptysis, no history of cancer. She endorses compliance with her anticoagulation. She denies any history of COPD however she is a former smoker.  Past Medical History:  Diagnosis Date  . A-fib (HCC)   . Allergy   . Arthritis   . Diabetes mellitus without complication (HCC)   . GERD (gastroesophageal reflux disease)   . Hyperlipidemia   . Hypertension   . TIA (transient ischemic attack)     Patient Active Problem List   Diagnosis Date Noted    . Atrial fibrillation with RVR (HCC) 06/30/2017  . TIA (transient ischemic attack) 12/10/2016  . CVA (cerebral vascular accident) (HCC) 12/09/2016  . Myocardial infarction (HCC) 07/30/2015  . Urinary frequency 02/09/2015  . RUQ abdominal pain 01/26/2015  . Shortness of breath 01/26/2015  . Anxiety 01/26/2015  . Chronic pruritus 12/21/2014  . Cerebral vascular accident (HCC) 11/16/2014  . Chronic low back pain 11/16/2014  . Adult hypothyroidism 11/15/2014  . Allergic rhinitis 11/15/2014  . Body mass index (BMI) of 28.0-28.9 in adult 11/15/2014  . Arthritis 11/15/2014  . Cramps of lower extremity 11/15/2014  . Essential (primary) hypertension 11/15/2014  . Acid reflux 11/15/2014  . Hypercholesteremia 11/15/2014  . Cannot sleep 11/15/2014  . Psoriasis 11/15/2014  . Itch of skin 11/15/2014  . Restless leg 11/15/2014  . Diabetes mellitus, type 2 (HCC) 11/15/2014  . Breath shortness 11/15/2014  . Arteriosclerosis of coronary artery 02/20/2014  . AF (paroxysmal atrial fibrillation) (HCC) 02/20/2014  . Temporary cerebral vascular dysfunction 02/20/2014  . DD (diverticular disease) 10/05/2013    Past Surgical History:  Procedure Laterality Date  . ABDOMINAL HYSTERECTOMY  1970  . BACK SURGERY  2013  . colectomy Right 10/08/2013   Dr. Egbert Garibaldi  . CORONARY ANGIOPLASTY WITH STENT PLACEMENT      Prior to Admission medications   Medication Sig Start Date End Date Taking? Authorizing Provider  amiodarone (PACERONE) 400 MG tablet Take 0.5 tablets (200 mg total) by mouth 2 (two) times daily. 07/03/17   Milagros Loll, MD  apixaban (ELIQUIS) 2.5 MG TABS tablet Take 1 tablet (2.5 mg total) by mouth  2 (two) times daily. 07/04/17   Salary, Evelena Asa, MD  atorvastatin (LIPITOR) 40 MG tablet Take 1 tablet (40 mg total) by mouth daily at 6 PM. 12/10/16   Alford Highland, MD  clopidogrel (PLAVIX) 75 MG tablet Take 1 tablet (75 mg total) by mouth daily. 12/10/16   Alford Highland, MD  fenofibrate  (TRICOR) 145 MG tablet Take 1 tablet (145 mg total) by mouth daily. 09/10/15   Lorie Phenix, MD  fluocinonide ointment (LIDEX) 0.05 % Apply 1 application topically daily as needed. 11/17/16   [provider]  fluticasone [name]) 50 MCG/ACT nasal spray USE ONE SPRAY IN EACH NOSTRIL DAILY 09/27/15   Lorie Phenix, MD  guaiFENesin-dextromethorphan [facility] DM) 100-10 MG/5ML syrup Take 5 mLs by mouth every 6 (six) hours as needed for cough. 07/03/17   Sudini, Wardell Heath, MD  HUMULIN 70/30 KWIKPEN (70-30) 100 UNIT/ML PEN Inject 32 Units into the skin at bedtime. 06/11/17   [provider]  HYDROcodone-acetaminophen (NORCO/VICODIN) 5-325 MG tablet Take 1 tablet by mouth 2 (two) times daily. As needed for chronic low back pain 07/18/15   Lorie Phenix, MD  levothyroxine (SYNTHROID, LEVOTHROID) 50 MCG tablet Take 1 tablet (50 mcg total) by mouth daily. PATIENT NEEDS TO SCHEDULE OFFICE VISIT FOR FOLLOW UP 02/01/16   Malva Limes, MD  metoprolol tartrate (LOPRESSOR) 100 MG tablet Take 1 tablet (100 mg total) by mouth 2 (two) times daily. 07/03/17   Milagros Loll, MD  Multiple Vitamin (MULTIVITAMIN) tablet Take 1 tablet by mouth daily.    [provider]  ondansetron (ZOFRAN ODT) 4 MG disintegrating tablet Take 1 tablet (4 mg total) by mouth every 8 (eight) hours as needed for nausea or vomiting. 06/29/17   Emily Filbert, MD  Potassium 99 MG TABS Take 1 tablet by mouth daily.    [provider]  rOPINIRole (REQUIP) 0.25 MG tablet Take 1 tablet (0.25 mg total) by mouth at bedtime. Patient not taking: Reported on 06/30/2017 12/10/16 12/10/17  Alford Highland, MD  VENTOLIN HFA 108 [id number] Base) MCG/ACT inhaler Inhale 2 puffs into the lungs every 6 (six) hours as needed. 05/24/17   [provider]    Allergies Diphenhydramine; Metformin; Oxybutynin; Amlodipine; Ciprofloxacin; Lisinopril; Penicillins; and Saxagliptin  Family History  Problem Relation Age of Onset  .  Breast cancer Mother   . Pancreatitis Father   . Breast cancer Sister   . Lung cancer Brother   . Melanoma Brother   . Throat cancer Brother     Social History Social History   Tobacco Use  . Smoking status: Former Smoker    Packs/day: 1.00    Years: 30.00    Pack years: 30.00    Last attempt to quit: 11/30/1999    Years since quitting: 17.6  . Smokeless tobacco: Never Used  Substance Use Topics  . Alcohol use: No  . Drug use: No    Review of Systems  Constitutional: Negative for fever. Eyes: Negative for visual changes. ENT: Negative for sore throat. Neck: No neck pain  Cardiovascular: Negative for chest pain. Respiratory: + shortness of breath and cough Gastrointestinal: Negative for abdominal pain, vomiting. + diarrhea. Genitourinary: Negative for dysuria. Musculoskeletal: Negative for back pain. Skin: Negative for rash. Neurological: Negative for headaches, weakness or numbness. Psych: No SI or HI  ____________________________________________   PHYSICAL EXAM:  VITAL SIGNS: ED Triage Vitals  Enc Vitals Group     BP 08/03/17 1556 (!) 155/65     Pulse Rate 08/03/17 1556  86     Resp 08/03/17 1556 18     Temp 08/03/17 1556 97.9 F (36.6 C)     Temp Source 08/03/17 1556 Oral     SpO2 08/03/17 1556 94 %     Weight 08/03/17 1600 153 lb (69.4 kg)     Height 08/03/17 1600 5\' 2"  (1.575 m)     Head Circumference --      Peak Flow --      Pain Score 08/03/17 1600 0     Pain Loc --      Pain Edu? --      Excl. in GC? --     Constitutional: Alert and oriented. Well appearing and in no apparent distress. HEENT:      Head: Normocephalic and atraumatic.         Eyes: Conjunctivae are normal. Sclera is non-icteric.       Mouth/Throat: Mucous membranes are moist.       Neck: Supple with no signs of meningismus. Cardiovascular: Regular rate and rhythm. No murmurs, gallops, or rubs. 2+ symmetrical distal pulses are present in all extremities. No JVD. Respiratory:  Normal respiratory effort, normal sats, slightly diminished air movement with expiratory wheezes throughout. Gastrointestinal: Soft, non tender, and non distended with positive bowel sounds. No rebound or guarding. Genitourinary: No CVA tenderness. Musculoskeletal: Nontender with normal range of motion in all extremities. No edema, cyanosis, or erythema of extremities. Neurologic: Normal speech and language. Face is symmetric. Moving all extremities. No gross focal neurologic deficits are appreciated. Skin: Skin is warm, dry and intact. No rash noted. Psychiatric: Mood and affect are normal. Speech and behavior are normal.  ____________________________________________   LABS (all labs ordered are listed, but only abnormal results are displayed)  Labs Reviewed  CBC - Abnormal; Notable for the following components:      Result Value   RDW 16.1 (*)    All other components within normal limits  TROPONIN I - Abnormal; Notable for the following components:   Troponin I 0.44 (*)    All other components within normal limits  COMPREHENSIVE METABOLIC PANEL - Abnormal; Notable for the following components:   Glucose, Bld 179 (*)    BUN 25 (*)    Creatinine, Ser 1.23 (*)    Alkaline Phosphatase 37 (*)    GFR calc non Af Amer 39 (*)    GFR calc Af Amer 45 (*)    All other components within normal limits  MAGNESIUM   ____________________________________________  EKG  ED ECG REPORT I, Nita Sickle, the attending physician, personally viewed and interpreted this ECG.  Normal sinus rhythm, rate of 85, normal intervals, normal axis, no ST elevations or depressions. ____________________________________________  RADIOLOGY  I have personally reviewed the images performed during this visit and I agree with the Radiologist's read.   Interpretation by Radiologist:  Dg Chest 2 View  Result Date: 08/03/2017 CLINICAL DATA:  Patient had onset of palpitations last night associated with  shortness of breath. The patient reports a 4 day history of cough. History of hypertension, atrial fibrillation, coronary artery disease with previous MI, previous CVA. Former smoker. EXAM: CHEST  2 VIEW COMPARISON:  Portable chest x-ray of June 29, 2017 FINDINGS: The lungs are well-expanded. The interstitial markings are coarse. There is stable biapical pleural thickening. There is no alveolar infiltrate. The heart and pulmonary vascularity are normal. There is calcification in the wall of the aortic arch and descending thoracic aorta. The bony thorax is unremarkable. IMPRESSION: Chronic  bronchitic changes, stable.  No acute pneumonia nor CHF. Thoracic aortic atherosclerosis. Electronically Signed   By: David  [place] M.D.   On: 08/03/2017 17:00     ____________________________________________   PROCEDURES  Procedure(s) performed: None Procedures Critical Care performed:  None ____________________________________________   INITIAL IMPRESSION / ASSESSMENT AND PLAN / ED COURSE  84 y.o. female with a history of paroxysmal atrial fibrillation on amiodarone and Eliquis, diabetes, hypertension, hyperlipidemia, TIA on Plavix who presents for evaluation of palpitations, dry cough, and SOB. patient is in no respiratory distress, normal work of breathing, normal sats, she does have slightly diminished air movement with diffuse expiratory wheezes throughout. Chest x-ray is concerning for chronic bronchitic changes. Patient has a history of former smoking. She has had no fever therefore fluids left slightly. No evidence of pneumonia on chest x-ray. Patient is oriented and antibiotics which was started yesterday at the walk-in clinic. EKG done at [facility] prior to arrival showing A. fib with RVR however by the time patient arrived to the emergency room she had converted to normal sinus rhythm. patient's first troponin is 0.44 which is patient's baseline.Her electrolytes are within normal limits. White count is  within normal limits. We'll treat with 1 DuoNeb. Recommended prednisone however patient reports that several years ago she received a steroid shot and ended up in the ICU in DKA and therefore she is refusing steroids at this time.    _________________________ 7:54 PM on 08/03/2017 -----------------------------------------  patient received 1 DuoNeb and is now moving great air, no longer wheezing, remains in normal sinus rhythm. Patient feels markedly improved. She is going to be discharged home, continue antibiotics that were prescribed yesterday and albuterol inhaler every 4 hours for shortness of breath. Discussed return precautions and close follow-up. Recommend he return to the emergency room she spikes a fever or chest pain or for shortness of breath gets worse. Patient and daughter are comfortable with the plan.   As part of my medical decision making, I reviewed the following data within the electronic MEDICAL RECORD NUMBER Nursing notes reviewed and incorporated, Labs reviewed , EKG interpreted , Old EKG reviewed, Old chart reviewed, Radiograph reviewed , Notes from prior ED visits and [place] Controlled Substance Database    Pertinent labs & imaging results that were available during my care of the patient were reviewed by me and considered in my medical decision making (see chart for details).    ____________________________________________   FINAL CLINICAL IMPRESSION(S) / ED DIAGNOSES  Final diagnoses:  Bronchitis      NEW MEDICATIONS STARTED DURING THIS VISIT:  ED Discharge Orders    None       Note:  This document was prepared using Dragon voice recognition software and may include unintentional dictation errors.    Don Perking, [place], MD 08/03/17 [phone]

## 2017-08-03 NOTE — ED Triage Notes (Signed)
States before going to bed last night felt palpitations, denies feeling chest pain, did feel SOB. Also cough noted in triage, states has had for 4 days.

## 2017-08-03 NOTE — Discharge Instructions (Addendum)
You were seen today in the Emergency Department (ED) and was diagnosed with a COPD exacerbation. Chronic obstructive pulmonary disease (COPD) is a general term for a group of lung diseases, including emphysema and chronic bronchitis. People with COPD have decreased airflow in and out of the lungs, which makes it hard to breathe. The airways also can get clogged with thick mucus. Cigarette smoking is a major cause of COPD.   Although there is no cure for COPD, you can slow its progress. Following your treatment plan and taking care of yourself can help you feel better and live longer.   Use your albuterol inhaler 2 puffs every 4 hour as needed for shortness of breath, wheezing, or cough. Take antibiotics as prescribed.  Follow-up with your doctor in 1 day for re-evaluation.   When should you call for help?  Call 911 anytime you think you may need emergency care. For example, call if:  You have severe trouble breathing.  You have severe chest pain. Call your doctor now or seek immediate medical care if:  You have new or worse shortness of breath.  You develop new chest pain.  You are coughing more deeply or more often, especially if you notice more mucus or a change in the color of your mucus.  You cough up blood.  You have new or increased swelling in your legs or belly.  You have a fever. Watch closely for changes in your health, and be sure to contact your doctor if:  You use your antibiotic prescription.  Your symptoms are getting worse   How can you care for yourself at home?   During an exacerbation  Do not panic if you start to have one. Quick treatment at home may help you prevent serious breathing problems. If you have a COPD exacerbation plan that you developed with your doctor, follow it.  Take your medicines exactly as your doctor tells you.  Use your inhaler as directed by your doctor. If your symptoms do not get better after you use your medicine, have someone take you to the  emergency room. Call an ambulance if necessary.  With inhaled medicines, a spacer or a nebulizer may help you get more medicine to your lungs. Ask your doctor or pharmacist how to use them properly. Practice using the spacer in front of a mirror before you have an exacerbation. This may help you get the medicine into your lungs quickly.  If your doctor has given you steroid pills, take them as directed.  Your doctor may have given you a prescription for antibiotics, which you are to fill if you need it. Call your doctor if you use the prescription.  Talk to your doctor if you have any problems with your medicine.  Preventing an exacerbation  Do not smoke. This is the most important step you can take to prevent more damage to your lungs and prevent problems. If you already smoke, it is never too late to stop. If you need help quitting, talk to your doctor about stop-smoking programs and medicines. These can increase your chances of quitting for good.  Take your daily medicines as prescribed.  Avoid colds and flu.  Get a pneumococcal vaccine.  Get a flu vaccine each year, as soon as it is available. Ask those you live or work with to do the same, so they will not get the flu and infect you.  Try to stay away from people with colds or the flu.  Wash your hands  often. Avoid secondhand smoke; air pollution; cold, dry air; hot, humid air; and high altitudes. Stay at home with your windows closed when air pollution is bad.  Learn breathing techniques for COPD, such as breathing through pursed lips. These techniques can help you breathe easier during an exacerbation.  Staying healthy  Do not smoke. This is the most important step you can take to prevent more damage to your lungs. If you need help quitting, talk to your doctor about stop-smoking programs and medicines. These can increase your chances of quitting for good.  Avoid colds and flu. Get a pneumococcal vaccine shot. If you have had one before,  ask your doctor whether you need a second dose. Get the flu vaccine every fall. If you must be around people with colds or the flu, wash your hands often.  Avoid secondhand smoke, air pollution, and high altitudes. Also avoid cold, dry air and hot, humid air. Stay at home with your windows closed when air pollution is bad.  Medicines and oxygen therapy  Take your medicines exactly as prescribed. Call your doctor if you think you are having a problem with your medicine.  You may be taking medicines such as:  Bronchodilators. These help open your airways and make breathing easier. Bronchodilators are either short-acting (work for 6 to 9 hours) or long-acting (work for 24 hours). You inhale most bronchodilators, so they start to act quickly. Always carry your quick-relief inhaler with you in case you need it while you are away from home.  Corticosteroids (prednisone, budesonide). These reduce airway inflammation. They come in pill or inhaled form. You must take these medicines every day for them to work well. A spacer may help you get more inhaled medicine to your lungs. Ask your doctor or pharmacist if a spacer is right for you. If it is, ask how to use it properly.  Do not take any vitamins, over-the-counter medicine, or herbal products without talking to your doctor first.  If your doctor prescribed antibiotics, take them as directed. Do not stop taking them just because you feel better. You need to take the full course of antibiotics.  Oxygen therapy boosts the amount of oxygen in your blood and helps you breathe easier. Use the flow rate your doctor has recommended, and do not change it without talking to your doctor first.  Activity  Get regular exercise. Walking is an easy way to get exercise. Start out slowly, and walk a little more each day.  Pay attention to your breathing. You are exercising too hard if you cannot talk while you are exercising.  Take short rest breaks when doing household  chores and other activities.  Learn breathing methods--such as breathing through pursed lips--to help you become less short of breath.  If your doctor has not set you up with a pulmonary rehabilitation program, talk to him or her about whether rehab is right for you. Rehab includes exercise programs, education about your disease and how to manage it, help with diet and other changes, and emotional support.  Diet  Eat regular, healthy meals. Use bronchodilators about 1 hour before you eat to make it easier to eat. Eat several small meals instead of three large ones. Drink beverages at the end of the meal. Avoid foods that are hard to chew.  Eat foods that contain fat and protein so that you do not lose weight and muscle mass. These foods include ice cream, pudding, cheese, eggs, and peanut butter.  Use less salt.  Too much salt can cause you to retain fluids, which makes it harder to breathe. Do not add salt while you are cooking or at the table. Eat fewer processed foods and foods from restaurants, including fast foods. Use fresh or frozen foods instead of canned foods.  Mental health  Talk to your family, friends, or a therapist about your feelings. It is normal to feel frightened, angry, hopeless, helpless, and even guilty. Talking openly about bad feelings can help you cope. If these feelings last, talk to your doctor.

## 2017-08-07 ENCOUNTER — Inpatient Hospital Stay
Admission: EM | Admit: 2017-08-07 | Discharge: 2017-08-14 | DRG: 193 | Disposition: A | Discharge status: Home Health | Payer: Medicare Other | Attending: Internal Medicine | Admitting: Internal Medicine

## 2017-08-07 ENCOUNTER — Emergency Department: Payer: Medicare Other

## 2017-08-07 ENCOUNTER — Other Ambulatory Visit: Payer: Self-pay

## 2017-08-07 DIAGNOSIS — E039 Hypothyroidism, unspecified: Secondary | ICD-10-CM | POA: Diagnosis present

## 2017-08-07 DIAGNOSIS — R609 Edema, unspecified: Secondary | ICD-10-CM | POA: Diagnosis present

## 2017-08-07 DIAGNOSIS — J9602 Acute respiratory failure with hypercapnia: Secondary | ICD-10-CM | POA: Diagnosis present

## 2017-08-07 DIAGNOSIS — E119 Type 2 diabetes mellitus without complications: Secondary | ICD-10-CM | POA: Diagnosis present

## 2017-08-07 DIAGNOSIS — J181 Lobar pneumonia, unspecified organism: Secondary | ICD-10-CM | POA: Diagnosis present

## 2017-08-07 DIAGNOSIS — N179 Acute kidney failure, unspecified: Secondary | ICD-10-CM | POA: Diagnosis not present

## 2017-08-07 DIAGNOSIS — Z955 Presence of coronary angioplasty implant and graft: Secondary | ICD-10-CM

## 2017-08-07 DIAGNOSIS — J209 Acute bronchitis, unspecified: Secondary | ICD-10-CM | POA: Diagnosis present

## 2017-08-07 DIAGNOSIS — Z880 Allergy status to penicillin: Secondary | ICD-10-CM | POA: Diagnosis not present

## 2017-08-07 DIAGNOSIS — J449 Chronic obstructive pulmonary disease, unspecified: Secondary | ICD-10-CM

## 2017-08-07 DIAGNOSIS — I509 Heart failure, unspecified: Secondary | ICD-10-CM | POA: Diagnosis not present

## 2017-08-07 DIAGNOSIS — I480 Paroxysmal atrial fibrillation: Secondary | ICD-10-CM | POA: Diagnosis present

## 2017-08-07 DIAGNOSIS — I482 Chronic atrial fibrillation: Secondary | ICD-10-CM | POA: Diagnosis not present

## 2017-08-07 DIAGNOSIS — R079 Chest pain, unspecified: Secondary | ICD-10-CM

## 2017-08-07 DIAGNOSIS — T380X5A Adverse effect of glucocorticoids and synthetic analogues, initial encounter: Secondary | ICD-10-CM | POA: Diagnosis present

## 2017-08-07 DIAGNOSIS — J9690 Respiratory failure, unspecified, unspecified whether with hypoxia or hypercapnia: Secondary | ICD-10-CM | POA: Diagnosis present

## 2017-08-07 DIAGNOSIS — Z794 Long term (current) use of insulin: Secondary | ICD-10-CM

## 2017-08-07 DIAGNOSIS — J189 Pneumonia, unspecified organism: Secondary | ICD-10-CM

## 2017-08-07 DIAGNOSIS — J9601 Acute respiratory failure with hypoxia: Secondary | ICD-10-CM | POA: Diagnosis present

## 2017-08-07 DIAGNOSIS — Z87891 Personal history of nicotine dependence: Secondary | ICD-10-CM

## 2017-08-07 DIAGNOSIS — Z7901 Long term (current) use of anticoagulants: Secondary | ICD-10-CM | POA: Diagnosis not present

## 2017-08-07 DIAGNOSIS — E785 Hyperlipidemia, unspecified: Secondary | ICD-10-CM | POA: Diagnosis present

## 2017-08-07 DIAGNOSIS — Z79890 Hormone replacement therapy (postmenopausal): Secondary | ICD-10-CM

## 2017-08-07 DIAGNOSIS — Z8673 Personal history of transient ischemic attack (TIA), and cerebral infarction without residual deficits: Secondary | ICD-10-CM | POA: Diagnosis not present

## 2017-08-07 DIAGNOSIS — R748 Abnormal levels of other serum enzymes: Secondary | ICD-10-CM | POA: Diagnosis not present

## 2017-08-07 DIAGNOSIS — I10 Essential (primary) hypertension: Secondary | ICD-10-CM | POA: Diagnosis present

## 2017-08-07 DIAGNOSIS — I4891 Unspecified atrial fibrillation: Secondary | ICD-10-CM | POA: Diagnosis not present

## 2017-08-07 DIAGNOSIS — I252 Old myocardial infarction: Secondary | ICD-10-CM | POA: Diagnosis not present

## 2017-08-07 DIAGNOSIS — Z888 Allergy status to other drugs, medicaments and biological substances status: Secondary | ICD-10-CM | POA: Diagnosis not present

## 2017-08-07 DIAGNOSIS — Z79899 Other long term (current) drug therapy: Secondary | ICD-10-CM | POA: Diagnosis not present

## 2017-08-07 DIAGNOSIS — J440 Chronic obstructive pulmonary disease with acute lower respiratory infection: Secondary | ICD-10-CM | POA: Diagnosis present

## 2017-08-07 DIAGNOSIS — R0602 Shortness of breath: Secondary | ICD-10-CM

## 2017-08-07 LAB — BLOOD GAS, ARTERIAL
ACID-BASE DEFICIT: 4.1 mmol/L — AB (ref 0.0–2.0)
Bicarbonate: 23 mmol/L (ref 20.0–28.0)
Delivery systems: POSITIVE
Expiratory PAP: 6
FIO2: 0.6
Inspiratory PAP: 12
MECHANICAL RATE: 8
O2 Saturation: 99.8 %
PH ART: 7.28 — AB (ref 7.350–7.450)
Patient temperature: 37
pCO2 arterial: 49 mmHg — ABNORMAL HIGH (ref 32.0–48.0)
pO2, Arterial: 232 mmHg — ABNORMAL HIGH (ref 83.0–108.0)

## 2017-08-07 LAB — BLOOD GAS, VENOUS
Acid-base deficit: 5.9 mmol/L — ABNORMAL HIGH (ref 0.0–2.0)
Bicarbonate: 24.4 mmol/L (ref 20.0–28.0)
DELIVERY SYSTEMS: POSITIVE
FIO2: 0.6
O2 Saturation: 98.3 %
PCO2 VEN: 70 mmHg — AB (ref 44.0–60.0)
PH VEN: 7.15 — AB (ref 7.250–7.430)
PO2 VEN: 136 mmHg — AB (ref 32.0–45.0)
Patient temperature: 37

## 2017-08-07 LAB — BASIC METABOLIC PANEL
ANION GAP: 10 (ref 5–15)
BUN: 26 mg/dL — ABNORMAL HIGH (ref 6–20)
CHLORIDE: 105 mmol/L (ref 101–111)
CO2: 22 mmol/L (ref 22–32)
CREATININE: 1.69 mg/dL — AB (ref 0.44–1.00)
Calcium: 9.4 mg/dL (ref 8.9–10.3)
GFR calc non Af Amer: 26 mL/min — ABNORMAL LOW (ref >60)
GFR, EST AFRICAN AMERICAN: 30 mL/min — AB (ref >60)
Glucose, Bld: 246 mg/dL — ABNORMAL HIGH (ref 65–99)
Potassium: 5.1 mmol/L (ref 3.5–5.1)
Sodium: 137 mmol/L (ref 135–145)

## 2017-08-07 LAB — CBC
HCT: 41.8 % (ref 35.0–47.0)
HEMOGLOBIN: 13.3 g/dL (ref 12.0–16.0)
MCH: 26.7 pg (ref 26.0–34.0)
MCHC: 31.8 g/dL — ABNORMAL LOW (ref 32.0–36.0)
MCV: 83.9 fL (ref 80.0–100.0)
Platelets: 416 10*3/uL (ref 150–440)
RBC: 4.99 MIL/uL (ref 3.80–5.20)
RDW: 16.2 % — ABNORMAL HIGH (ref 11.5–14.5)
WBC: 17 10*3/uL — ABNORMAL HIGH (ref 3.6–11.0)

## 2017-08-07 LAB — TROPONIN I
Troponin I: 0.38 ng/mL (ref <0.03)
Troponin I: 0.4 ng/mL (ref <0.03)
Troponin I: 0.57 ng/mL (ref <0.03)

## 2017-08-07 LAB — GLUCOSE, CAPILLARY
GLUCOSE-CAPILLARY: 323 mg/dL — AB (ref 65–99)
Glucose-Capillary: 337 mg/dL — ABNORMAL HIGH (ref 65–99)

## 2017-08-07 LAB — TSH: TSH: 1.393 u[IU]/mL (ref 0.350–4.500)

## 2017-08-07 MED ORDER — ALBUTEROL SULFATE (2.5 MG/3ML) 0.083% IN NEBU
2.5000 mg | INHALATION_SOLUTION | Freq: Once | RESPIRATORY_TRACT | Status: AC
  Administered 2017-08-07: 2.5 mg via RESPIRATORY_TRACT

## 2017-08-07 MED ORDER — ATORVASTATIN CALCIUM 20 MG PO TABS
40.0000 mg | ORAL_TABLET | Freq: Every day | ORAL | Status: DC
  Administered 2017-08-07 – 2017-08-13 (×7): 40 mg via ORAL
  Filled 2017-08-07 (×7): qty 2

## 2017-08-07 MED ORDER — IPRATROPIUM-ALBUTEROL 0.5-2.5 (3) MG/3ML IN SOLN: 3.0000 mL | Freq: Once | RESPIRATORY_TRACT | Status: DC

## 2017-08-07 MED ORDER — METHYLPREDNISOLONE SODIUM SUCC 125 MG IJ SOLR
60.0000 mg | Freq: Four times a day (QID) | INTRAMUSCULAR | Status: DC
  Administered 2017-08-07 – 2017-08-08 (×4): 60 mg via INTRAVENOUS
  Filled 2017-08-07 (×4): qty 2

## 2017-08-07 MED ORDER — FENOFIBRATE 160 MG PO TABS
160.0000 mg | ORAL_TABLET | Freq: Every day | ORAL | Status: DC
  Administered 2017-08-07 – 2017-08-14 (×8): 160 mg via ORAL
  Filled 2017-08-07 (×8): qty 1

## 2017-08-07 MED ORDER — INSULIN GLARGINE 100 UNIT/ML ~~LOC~~ SOLN
18.0000 [IU] | Freq: Every day | SUBCUTANEOUS | Status: DC
  Administered 2017-08-07 – 2017-08-09 (×3): 18 [IU] via SUBCUTANEOUS
  Filled 2017-08-07 (×4): qty 0.18

## 2017-08-07 MED ORDER — ONDANSETRON HCL 4 MG PO TABS
4.0000 mg | ORAL_TABLET | Freq: Four times a day (QID) | ORAL | Status: DC | PRN
  Filled 2017-08-07: qty 1

## 2017-08-07 MED ORDER — ACETAMINOPHEN 325 MG PO TABS: 650.0000 mg | ORAL_TABLET | Freq: Four times a day (QID) | ORAL | Status: DC | PRN

## 2017-08-07 MED ORDER — RISAQUAD PO CAPS
2.0000 | ORAL_CAPSULE | Freq: Every day | ORAL | Status: DC
  Administered 2017-08-07 – 2017-08-14 (×8): 2 via ORAL
  Filled 2017-08-07 (×8): qty 2

## 2017-08-07 MED ORDER — VITAMIN B-12 1000 MCG PO TABS
1000.0000 ug | ORAL_TABLET | Freq: Every day | ORAL | Status: DC
  Administered 2017-08-07 – 2017-08-14 (×8): 1000 ug via ORAL
  Filled 2017-08-07 (×8): qty 1

## 2017-08-07 MED ORDER — METHYLPREDNISOLONE SODIUM SUCC 125 MG IJ SOLR
125.0000 mg | Freq: Once | INTRAMUSCULAR | Status: AC
Start: 2017-08-07 — End: 2017-08-07
  Administered 2017-08-07: 125 mg via INTRAVENOUS
  Filled 2017-08-07: qty 2

## 2017-08-07 MED ORDER — ACETAMINOPHEN 650 MG RE SUPP: 650.0000 mg | Freq: Four times a day (QID) | RECTAL | Status: DC | PRN

## 2017-08-07 MED ORDER — VITAMIN D 1000 UNITS PO TABS
5000.0000 [IU] | ORAL_TABLET | Freq: Every day | ORAL | Status: DC
  Administered 2017-08-07 – 2017-08-14 (×8): 5000 [IU] via ORAL
  Filled 2017-08-07 (×8): qty 5

## 2017-08-07 MED ORDER — NITROGLYCERIN 2 % TD OINT
0.5000 [in_us] | TOPICAL_OINTMENT | Freq: Once | TRANSDERMAL | Status: AC
  Administered 2017-08-07: 0.5 [in_us] via TOPICAL

## 2017-08-07 MED ORDER — CEFUROXIME AXETIL 500 MG PO TABS
500.0000 mg | ORAL_TABLET | Freq: Two times a day (BID) | ORAL | Status: DC
  Administered 2017-08-07 – 2017-08-09 (×5): 500 mg via ORAL
  Filled 2017-08-07 (×6): qty 1

## 2017-08-07 MED ORDER — GUAIFENESIN ER 600 MG PO TB12
600.0000 mg | ORAL_TABLET | Freq: Two times a day (BID) | ORAL | Status: DC
  Administered 2017-08-07 – 2017-08-09 (×5): 600 mg via ORAL
  Filled 2017-08-07 (×6): qty 1

## 2017-08-07 MED ORDER — IPRATROPIUM-ALBUTEROL 0.5-2.5 (3) MG/3ML IN SOLN
3.0000 mL | Freq: Four times a day (QID) | RESPIRATORY_TRACT | Status: DC
  Administered 2017-08-07 – 2017-08-08 (×3): 3 mL via RESPIRATORY_TRACT
  Filled 2017-08-07 (×3): qty 3

## 2017-08-07 MED ORDER — DOCUSATE SODIUM 100 MG PO CAPS
100.0000 mg | ORAL_CAPSULE | Freq: Two times a day (BID) | ORAL | Status: DC
  Administered 2017-08-08 – 2017-08-13 (×3): 100 mg via ORAL
  Filled 2017-08-07 (×12): qty 1

## 2017-08-07 MED ORDER — AMIODARONE HCL 200 MG PO TABS
200.0000 mg | ORAL_TABLET | Freq: Every day | ORAL | Status: DC
  Administered 2017-08-07 – 2017-08-13 (×7): 200 mg via ORAL
  Filled 2017-08-07 (×7): qty 1

## 2017-08-07 MED ORDER — SODIUM CHLORIDE 0.9 % IV SOLN
500.0000 mg | Freq: Once | INTRAVENOUS | Status: AC
  Administered 2017-08-07: 500 mg via INTRAVENOUS
  Filled 2017-08-07: qty 500

## 2017-08-07 MED ORDER — ALBUTEROL SULFATE (2.5 MG/3ML) 0.083% IN NEBU
2.5000 mg | INHALATION_SOLUTION | RESPIRATORY_TRACT | Status: DC | PRN
  Administered 2017-08-07 – 2017-08-10 (×6): 2.5 mg via RESPIRATORY_TRACT
  Filled 2017-08-07 (×6): qty 3

## 2017-08-07 MED ORDER — LEVOTHYROXINE SODIUM 50 MCG PO TABS
50.0000 ug | ORAL_TABLET | Freq: Every day | ORAL | Status: DC
  Administered 2017-08-08 – 2017-08-14 (×7): 50 ug via ORAL
  Filled 2017-08-07 (×7): qty 1

## 2017-08-07 MED ORDER — BENZONATATE 100 MG PO CAPS
200.0000 mg | ORAL_CAPSULE | Freq: Three times a day (TID) | ORAL | Status: DC | PRN
  Administered 2017-08-08 – 2017-08-12 (×7): 200 mg via ORAL
  Filled 2017-08-07 (×7): qty 2

## 2017-08-07 MED ORDER — APIXABAN 2.5 MG PO TABS
2.5000 mg | ORAL_TABLET | Freq: Two times a day (BID) | ORAL | Status: DC
  Administered 2017-08-07 – 2017-08-12 (×11): 2.5 mg via ORAL
  Filled 2017-08-07 (×11): qty 1

## 2017-08-07 MED ORDER — HYDROXYZINE HCL 25 MG PO TABS
25.0000 mg | ORAL_TABLET | Freq: Four times a day (QID) | ORAL | Status: DC | PRN
Start: 2017-08-07 — End: 2017-08-14
  Administered 2017-08-08 – 2017-08-14 (×3): 25 mg via ORAL
  Filled 2017-08-07 (×6): qty 1

## 2017-08-07 MED ORDER — INSULIN ASPART 100 UNIT/ML ~~LOC~~ SOLN
0.0000 [IU] | Freq: Three times a day (TID) | SUBCUTANEOUS | Status: DC
  Administered 2017-08-07: 17:00:00 11 [IU] via SUBCUTANEOUS
  Administered 2017-08-08 (×3): 8 [IU] via SUBCUTANEOUS
  Administered 2017-08-09 (×2): 5 [IU] via SUBCUTANEOUS
  Administered 2017-08-09: 08:00:00 8 [IU] via SUBCUTANEOUS
  Administered 2017-08-10: 5 [IU] via SUBCUTANEOUS
  Administered 2017-08-10: 3 [IU] via SUBCUTANEOUS
  Administered 2017-08-10: 12:00:00 8 [IU] via SUBCUTANEOUS
  Administered 2017-08-11: 15 [IU] via SUBCUTANEOUS
  Administered 2017-08-11: 08:00:00 3 [IU] via SUBCUTANEOUS
  Administered 2017-08-11: 13:00:00 11 [IU] via SUBCUTANEOUS
  Administered 2017-08-12 (×2): 5 [IU] via SUBCUTANEOUS
  Administered 2017-08-12: 11 [IU] via SUBCUTANEOUS
  Administered 2017-08-13: 08:00:00 3 [IU] via SUBCUTANEOUS
  Administered 2017-08-13 (×2): 11 [IU] via SUBCUTANEOUS
  Filled 2017-08-07 (×20): qty 1

## 2017-08-07 MED ORDER — METOPROLOL TARTRATE 50 MG PO TABS
100.0000 mg | ORAL_TABLET | Freq: Two times a day (BID) | ORAL | Status: DC
  Administered 2017-08-07 – 2017-08-14 (×14): 100 mg via ORAL
  Filled 2017-08-07 (×15): qty 2

## 2017-08-07 MED ORDER — ONDANSETRON HCL 4 MG/2ML IJ SOLN: 4.0000 mg | Freq: Four times a day (QID) | INTRAMUSCULAR | Status: DC | PRN

## 2017-08-07 MED ORDER — FLUTICASONE PROPIONATE 50 MCG/ACT NA SUSP
1.0000 | Freq: Every day | NASAL | Status: DC
  Administered 2017-08-09 – 2017-08-11 (×2): 1 via NASAL
  Filled 2017-08-07: qty 16

## 2017-08-07 MED ORDER — HYDROCODONE-ACETAMINOPHEN 5-325 MG PO TABS
1.0000 | ORAL_TABLET | Freq: Two times a day (BID) | ORAL | Status: DC
  Administered 2017-08-07: 22:00:00 1 via ORAL
  Filled 2017-08-07 (×2): qty 1

## 2017-08-07 MED ORDER — IPRATROPIUM-ALBUTEROL 0.5-2.5 (3) MG/3ML IN SOLN
3.0000 mL | Freq: Once | RESPIRATORY_TRACT | Status: AC
  Administered 2017-08-07: 3 mL via RESPIRATORY_TRACT
  Filled 2017-08-07: qty 3

## 2017-08-07 MED ORDER — SODIUM CHLORIDE 0.9 % IV SOLN
INTRAVENOUS | Status: DC
  Administered 2017-08-07 (×2): via INTRAVENOUS

## 2017-08-07 MED ORDER — NITROGLYCERIN 2 % TD OINT
TOPICAL_OINTMENT | TRANSDERMAL | Status: AC
  Administered 2017-08-07: 0.5 [in_us] via TOPICAL
  Filled 2017-08-07: qty 1

## 2017-08-07 MED ORDER — SODIUM CHLORIDE 0.9 % IV SOLN
1.0000 g | Freq: Once | INTRAVENOUS | Status: AC
  Administered 2017-08-07: 1 g via INTRAVENOUS
  Filled 2017-08-07: qty 10

## 2017-08-07 NOTE — ED Triage Notes (Signed)
Pt arrived via EMS from home d/t respiratory distress that has progressed over the last week. Pt was recently seen for same. Pt has hx of A. Fib and CHF. Pt on cpap via EMS.

## 2017-08-07 NOTE — ED Provider Notes (Signed)
Patient stable off of BiPAP, continuing to do well from a respiratory standpoint, will have nurse discussed with hospitalist change of disposition to possibly floor rather than ICU at this point.   Governor Rooks, MD 08/07/17 [phone]

## 2017-08-07 NOTE — ED Notes (Signed)
Pt continues to feel improved requesting breakfast, Dr. Katheren Shams called back and will switch pt to regular room.

## 2017-08-07 NOTE — ED Notes (Signed)
Pt was able to void using the bed pan and tolerated procedure well, no shortness of breath noted at this time.

## 2017-08-07 NOTE — ED Notes (Signed)
Pt was able to get up and use bedside commode without difficulty.

## 2017-08-07 NOTE — ED Notes (Signed)
Dr. Katheren Shams in to see pt and spoke with Dr. Shaune Pollack, we will try trial of removing bi-pap and see how pt tolerates this. Pt is awake and alert at this time. Denies pain or discomfort.

## 2017-08-07 NOTE — Progress Notes (Signed)
Pt taken off Bipap for a trial on 3L [place]. Pt tol well at this time. Bipap remains at bedside. Will continue to monitor.

## 2017-08-07 NOTE — ED Provider Notes (Signed)
-----------------------------------------   8:58 AM on 08/07/2017 -----------------------------------------  I was able to reevaluate the patient, and she was resting comfortably sleeping with good vital signs, ease of breathing on the BiPAP although she still does have pretty significant breath sounds on the left.  As we moved her into better position sitting in the bed she became quite wheezy and bronchospastic, although without formal diagnosis of COPD, seems significantly contributed to this as well as the mucus plugging/pneumonia seen on her CT.  I discussed with Dr. Katheren Shams, hospitalist, to go ahead and give her a dose of Solu-Medrol as well as another DuoNeb treatment.  We are going to try her off of BiPAP after the DuoNeb and see how she does.  Apparently she had feel that overnight, however she had also gotten up and walked to the bathroom which was a pretty significant exertion.  There are no ICU beds available, she is been here multiple hours, and does seem to be having some improvement, perhaps if she improves with her respiratory status and does not need BiPAP she may be able to go to the floor.   Governor Rooks, MD 08/07/17 [phone]

## 2017-08-07 NOTE — ED Notes (Signed)
Respiratory in and bipap removed, breathing treatment started.

## 2017-08-07 NOTE — H&P (Signed)
Peggy [name] is an 84 y.o. female.   Chief Complaint: Respiratory distress HPI: The patient with past medical history of A. fib, TIA, hypertension and diabetes presents to the emergency department with shortness of breath.  The patient has had gradually worsening cough times 1 week.  The patient states that she is coughing up scant amounts of thick white phlegm.  It is not frothy or blood-tinged.  She denies chest pain.  Chest x-ray showed diffuse interstitial thickening but no infiltrates.  She was placed on BiPAP due to work of breathing and poor oxygenation prior to the emergency department staff calling the hospitalist service for admission.  Past Medical History:  Diagnosis Date  . A-fib ([place])   . Allergy   . Arthritis   . Diabetes mellitus without complication ([place])   . GERD (gastroesophageal reflux disease)   . Hyperlipidemia   . Hypertension   . TIA (transient ischemic attack)     Past Surgical History:  Procedure Laterality Date  . ABDOMINAL HYSTERECTOMY  1970  . BACK SURGERY  2013  . colectomy Right 10/08/2013   Dr. Marina Gravel  . CORONARY ANGIOPLASTY WITH STENT PLACEMENT      Family History  Problem Relation Age of Onset  . Breast cancer Mother   . Pancreatitis Father   . Breast cancer Sister   . Lung cancer Brother   . Melanoma Brother   . Throat cancer Brother    Social History:  reports that she quit smoking about 17 years ago. She has a 30.00 pack-year smoking history. she has never used smokeless tobacco. She reports that she does not drink alcohol or use drugs.  Allergies:  Allergies  Allergen Reactions  . Diphenhydramine Rash    AGITATION/DELIRIUM  . Metformin Diarrhea  . Oxybutynin Other (See Comments)    dizzy  . Amlodipine Rash  . Ciprofloxacin Rash  . Lisinopril Rash  . Penicillins Rash    Family states was a "long time ago"  . Saxagliptin Rash    Prior to Admission medications   Medication Sig Start Date End Date Taking? Authorizing Provider   amiodarone (PACERONE) 400 MG tablet Take 0.5 tablets (200 mg total) by mouth 2 (two) times daily. Patient taking differently: Take 200 mg by mouth at bedtime.  07/03/17  Yes Sudini, Alveta Heimlich, MD  apixaban (ELIQUIS) 2.5 MG TABS tablet Take 1 tablet (2.5 mg total) by mouth 2 (two) times daily. 07/04/17  Yes Salary, Avel Peace, MD  atorvastatin (LIPITOR) 40 MG tablet Take 1 tablet (40 mg total) by mouth daily at 6 PM. 12/10/16  Yes Wieting, Richard, MD  cefdinir (OMNICEF) 300 MG capsule Take 300 mg by mouth 2 (two) times daily. 08/01/17 08/11/17 Yes [provider]  Cholecalciferol (VITAMIN D3) 5000 units TABS Take 5,000 Units by mouth daily.   Yes [provider]  fenofibrate (TRICOR) 145 MG tablet Take 1 tablet (145 mg total) by mouth daily. 09/10/15  Yes Margarita Rana, MD  fluticasone [facility]) 50 MCG/ACT nasal spray USE ONE SPRAY IN EACH NOSTRIL DAILY 09/27/15  Yes Margarita Rana, MD  HUMULIN 70/30 KWIKPEN (70-30) 100 UNIT/ML PEN Inject 18 Units into the skin 2 (two) times daily.  06/11/17  Yes [provider]  HYDROcodone-acetaminophen (NORCO/VICODIN) 5-325 MG tablet Take 1 tablet by mouth 2 (two) times daily. As needed for chronic low back pain 07/18/15  Yes Margarita Rana, MD  hydrOXYzine (ATARAX/VISTARIL) 25 MG tablet Take 25 mg by mouth 4 (four) times daily as needed for itching.  06/05/17  Yes [provider]  lactobacillus acidophilus (BACID) TABS tablet Take 2 tablets by mouth daily.   Yes [provider]  levothyroxine (SYNTHROID, LEVOTHROID) 50 MCG tablet Take 1 tablet (50 mcg total) by mouth daily. PATIENT NEEDS TO SCHEDULE OFFICE VISIT FOR FOLLOW UP 02/01/16  Yes Birdie Sons, MD  metoprolol tartrate (LOPRESSOR) 100 MG tablet Take 1 tablet (100 mg total) by mouth 2 (two) times daily. 07/03/17  Yes Hillary Bow, MD  Potassium 99 MG TABS Take 1 tablet by mouth daily.   Yes [provider]  VENTOLIN HFA 108 (90 Base) MCG/ACT inhaler Inhale 2 puffs  into the lungs every 6 (six) hours as needed. 05/24/17  Yes [provider]  vitamin B-12 (CYANOCOBALAMIN) 1000 MCG tablet Take 1,000 mcg by mouth daily.   Yes [provider]     Results for orders placed or performed during the hospital encounter of 08/07/17 (from the past 48 hour(s))  Basic metabolic panel     Status: Abnormal   Collection Time: 08/07/17 12:17 AM  Result Value Ref Range   Sodium 137 135 - 145 mmol/L   Potassium 5.1 3.5 - 5.1 mmol/L   Chloride 105 101 - 111 mmol/L   CO2 22 22 - 32 mmol/L   Glucose, Bld 246 (H) 65 - 99 mg/dL   BUN 26 (H) 6 - 20 mg/dL   Creatinine, Ser 1.69 (H) 0.44 - 1.00 mg/dL   Calcium 9.4 8.9 - 10.3 mg/dL   GFR calc non Af Amer 26 (L) >60 mL/min   GFR calc Af Amer 30 (L) >60 mL/min    Comment: (NOTE) The eGFR has been calculated using the CKD EPI equation. This calculation has not been validated in all clinical situations. eGFR's persistently <60 mL/min signify possible Chronic Kidney Disease.    Anion gap 10 5 - 15    Comment: Performed at [facility], [address] [place], [place] [address]  CBC     Status: Abnormal   Collection Time: 08/07/17 12:17 AM  Result Value Ref Range   WBC 17.0 (H) 3.6 - 11.0 K/uL   RBC 4.99 3.80 - 5.20 MIL/uL   Hemoglobin 13.3 12.0 - 16.0 g/dL   HCT 41.8 35.0 - 47.0 %   MCV 83.9 80.0 - 100.0 fL   MCH 26.7 26.0 - 34.0 pg   MCHC 31.8 (L) 32.0 - 36.0 g/dL   RDW 16.2 (H) 11.5 - 14.5 %   Platelets 416 150 - 440 K/uL    Comment: Performed at [facility], [address] [place], [place] [address]  Troponin I     Status: Abnormal   Collection Time: 08/07/17 12:17 AM  Result Value Ref Range   Troponin I 0.38 (HH) <0.03 ng/mL    Comment: CRITICAL RESULT CALLED TO, READ BACK BY AND VERIFIED WITH DEIJA SCOTT ON 08/07/17 AT [place] [facility] Performed at [facility], [address] [place],  [address]   Blood gas, venous     Status: Abnormal   Collection Time:  08/07/17 12:20 AM  Result Value Ref Range   FIO2 0.60    Delivery systems BILEVEL POSITIVE AIRWAY PRESSURE    pH, Ven 7.15 (LL) 7.250 - 7.430    Comment: CRITICAL RESULT CALLED TO, READ BACK BY AND VERIFIED WITH: DR.R.BROWN AT 0030 ON 08/07/17 KSL    pCO2, Ven 70 (H) 44.0 - 60.0 mmHg   pO2, Ven 136.0 (H) 32.0 - 45.0 mmHg   Bicarbonate  24.4 20.0 - 28.0 mmol/L   Acid-base deficit 5.9 (H) 0.0 - 2.0 mmol/L   O2 Saturation 98.3 %   Patient temperature 37.0    Collection site VENOUS    Sample type VENOUS     Comment: Performed at [facility], [address] [place], [place] [address]  Blood gas, arterial     Status: Abnormal   Collection Time: 08/07/17  1:43 AM  Result Value Ref Range   FIO2 0.60    Delivery systems BILEVEL POSITIVE AIRWAY PRESSURE    Inspiratory PAP 12    Expiratory PAP 6.0    pH, Arterial 7.28 (L) 7.350 - 7.450   pCO2 arterial 49 (H) 32.0 - 48.0 mmHg   pO2, Arterial 232 (H) 83.0 - 108.0 mmHg   Bicarbonate 23.0 20.0 - 28.0 mmol/L   Acid-base deficit 4.1 (H) 0.0 - 2.0 mmol/L   O2 Saturation 99.8 %   Patient temperature 37.0    Collection site RIGHT RADIAL    Sample type ARTERIAL DRAW    Allens test (pass/fail) PASS PASS   Mechanical Rate 8     Comment: Performed at [facility], [address] [place], [place] [address]   Dg Chest Port 1 View  Result Date: 08/07/2017 CLINICAL DATA:  Initial evaluation for acute respiratory distress. EXAM: PORTABLE CHEST 1 VIEW COMPARISON:  Prior radiograph from 08/03/2017. FINDINGS: Mild cardiomegaly, stable.  Mediastinal silhouette normal. Lungs normally inflated. Underlying COPD. There is subtly increased prominence of the interstitial markings as compared to previous, suggesting pulmonary interstitial edema. No consolidative airspace disease. No pleural effusion. No pneumothorax. No acute osseous abnormality. IMPRESSION: 1. Diffuse prominence of the interstitial markings, consistent with mild diffuse  pulmonary interstitial edema. 2. Underlying chronic bronchitic changes/COPD. Electronically Signed   By: Jeannine Boga M.D.   On: 08/07/2017 00:56    Review of Systems  Constitutional: Negative for chills and fever.  HENT: Negative for sore throat and tinnitus.   Eyes: Negative for blurred vision and redness.  Respiratory: Positive for cough, sputum production and shortness of breath.   Cardiovascular: Negative for chest pain, palpitations, orthopnea and PND.  Gastrointestinal: Negative for abdominal pain, diarrhea, nausea and vomiting.  Genitourinary: Negative for dysuria, frequency and urgency.  Musculoskeletal: Negative for joint pain and myalgias.  Skin: Negative for rash.       No lesions  Neurological: Negative for speech change, focal weakness and weakness.  Endo/Heme/Allergies: Does not bruise/bleed easily.       No temperature intolerance  Psychiatric/Behavioral: Negative for depression and suicidal ideas.    Blood pressure 140/67, pulse (!) 40, resp. rate (!) 26, height _0  (1.575 m), weight 69.4 kg (153 lb), SpO2 100 %. Physical Exam  Vitals reviewed. Constitutional: She is oriented to person, place, and time. She appears well-developed and well-nourished. No distress.  Currently on BiPAP  HENT:  Head: Normocephalic and atraumatic.  Mouth/Throat: Oropharynx is clear and moist.  Eyes: Conjunctivae and EOM are normal. Pupils are equal, round, and reactive to light. No scleral icterus.  Neck: Normal range of motion. No tracheal deviation present. No thyromegaly present.  Cardiovascular: Normal rate, regular rhythm and normal heart sounds. Exam reveals no gallop and no friction rub.  No murmur heard. Respiratory: Breath sounds normal. She is in respiratory distress.  GI: Soft. Bowel sounds are normal. She exhibits no distension. There is no tenderness.  Genitourinary:  Genitourinary Comments: Deferred  Musculoskeletal: Normal range of motion. She exhibits no edema.   Lymphadenopathy:  She has no cervical adenopathy.  Neurological: She is alert and oriented to person, place, and time. No cranial nerve deficit. She exhibits normal muscle tone.  Skin: Skin is warm and dry. No rash noted. No erythema.  Psychiatric: She has a normal mood and affect. Her behavior is normal. Judgment and thought content normal.     Assessment/Plan This is an 84 year old female admitted for respiratory failure. 1.  Respiratory failure: Acute; with hypoxia.  More consistent with COPD than heart failure although the patient does have some interstitial edema.  She is currently on BiPAP to help decrease cardiac afterload and improve diastolic filling.  She has been given ceftriaxone and azithromycin to cover community-acquired pneumonia.  The patient meets criteria for sepsis via leukocytosis and tachypnea but the latter improves with BiPAP. 2.  Diabetes mellitus type 2: Continue basal insulin therapy along with sliding scale insulin while hospitalized.  Hold oral hypoglycemic agents. 3.  Atrial fibrillation: Paroxysmal; continue Eliquis.  Also continue amiodarone 4.  Hypertension: Initially uncontrolled.  Now her blood pressure is within normal limits although it is trending downward.  Despite improved tachypnea the patient may be developing septic shock.  Continue to monitor pressure.  Hydrate aggressively. 5.  Hypothyroidism: Check TSH; continue Synthroid 6.  Hyperlipidemia: Continue statin therapy 7.  DVT prophylaxis: Full dose anticoagulation as above 8.  GI prophylaxis: None The patient is a full code.  Time spent on admission orders and patient care approximately 45 minutes.  Discussed with ICU staff.  Harrie Foreman, MD 08/07/2017, 2:32 AM

## 2017-08-07 NOTE — Progress Notes (Signed)
This is an 84 year old female admitted for respiratory failure. 1.  Respiratory failure: Acute; with hypoxia.  More consistent with COPD than heart failure although the patient does have some interstitial edema.  She is currently on BiPAP to help decrease cardiac afterload and improve diastolic filling.  She has been given ceftriaxone and azithromycin to cover community-acquired pneumonia.  The patient meets criteria for sepsis via leukocytosis and tachypnea but the latter improves with BiPAP. 2.  Diabetes mellitus type 2: Continue basal insulin therapy along with sliding scale insulin while hospitalized.  Hold oral hypoglycemic agents. 3.  Atrial fibrillation: Paroxysmal; continue Eliquis.  Also continue amiodarone 4.  Hypertension: Initially uncontrolled.  Now her blood pressure is within normal limits although it is trending downward.  Despite improved tachypnea the patient may be developing septic shock.  Continue to monitor pressure.  Hydrate aggressively. 5.  Hypothyroidism: Check TSH; continue Synthroid 6.  Hyperlipidemia: Continue statin therapy 7.  DVT prophylaxis: Full dose anticoagulation as above 8.  GI prophylaxis: None   Patient successfully weaned off BiPAP in the emergency room, transferred to regular nursing floor bed, supplemental oxygen with weaning as tolerated, and all the plans as stated above

## 2017-08-07 NOTE — ED Provider Notes (Addendum)
[facility] Emergency Department Provider Note ______   First MD Initiated Contact with Patient 08/07/17 0010     (approximate)  I have reviewed the triage vital signs and the nursing notes.  Level 5 caveat: history limited secondary to respiratory distress HISTORY  Chief Complaint Respiratory Distress    HPI Peggy [name] is a 84 y.o. female with below list of chronic medical conditions including atrial fibrillation and recent ER visit on 08/03/2017 with "acute bronchitis return to the emergency department with progressive dyspnea times approximately 1 week with acute worsening tonight.  EMS states on their arrival patient's oxygen saturation in the mid 70s.  Patient presents to the emergency department with an apparent respiratory distress with tachypnea positive accessory respiratory muscle use speaking 1-2 word phrases    Past Medical History:  Diagnosis Date  . A-fib (HCC)   . Allergy   . Arthritis   . Diabetes mellitus without complication (HCC)   . GERD (gastroesophageal reflux disease)   . Hyperlipidemia   . Hypertension   . TIA (transient ischemic attack)     Patient Active Problem List   Diagnosis Date Noted  . Acute respiratory failure with hypoxia and hypercarbia (HCC) 08/07/2017  . Atrial fibrillation with RVR (HCC) 06/30/2017  . TIA (transient ischemic attack) 12/10/2016  . CVA (cerebral vascular accident) (HCC) 12/09/2016  . Myocardial infarction (HCC) 07/30/2015  . Urinary frequency 02/09/2015  . RUQ abdominal pain 01/26/2015  . Shortness of breath 01/26/2015  . Anxiety 01/26/2015  . Chronic pruritus 12/21/2014  . Cerebral vascular accident (HCC) 11/16/2014  . Chronic low back pain 11/16/2014  . Adult hypothyroidism 11/15/2014  . Allergic rhinitis 11/15/2014  . Body mass index (BMI) of 28.0-28.9 in adult 11/15/2014  . Arthritis 11/15/2014  . Cramps of lower extremity 11/15/2014  . Essential (primary) hypertension 11/15/2014  .  Acid reflux 11/15/2014  . Hypercholesteremia 11/15/2014  . Cannot sleep 11/15/2014  . Psoriasis 11/15/2014  . Itch of skin 11/15/2014  . Restless leg 11/15/2014  . Diabetes mellitus, type 2 (HCC) 11/15/2014  . Breath shortness 11/15/2014  . Arteriosclerosis of coronary artery 02/20/2014  . AF (paroxysmal atrial fibrillation) (HCC) 02/20/2014  . Temporary cerebral vascular dysfunction 02/20/2014  . DD (diverticular disease) 10/05/2013    Past Surgical History:  Procedure Laterality Date  . ABDOMINAL HYSTERECTOMY  1970  . BACK SURGERY  2013  . colectomy Right 10/08/2013   Dr. Egbert Garibaldi  . CORONARY ANGIOPLASTY WITH STENT PLACEMENT      Prior to Admission medications   Medication Sig Start Date End Date Taking? Authorizing Provider  amiodarone (PACERONE) 400 MG tablet Take 0.5 tablets (200 mg total) by mouth 2 (two) times daily. Patient taking differently: Take 200 mg by mouth at bedtime.  07/03/17  Yes Sudini, Wardell Heath, MD  apixaban (ELIQUIS) 2.5 MG TABS tablet Take 1 tablet (2.5 mg total) by mouth 2 (two) times daily. 07/04/17  Yes Salary, Evelena Asa, MD  atorvastatin (LIPITOR) 40 MG tablet Take 1 tablet (40 mg total) by mouth daily at 6 PM. 12/10/16  Yes Wieting, Richard, MD  cefdinir (OMNICEF) 300 MG capsule Take 300 mg by mouth 2 (two) times daily. 08/01/17 08/11/17 Yes [provider]  Cholecalciferol (VITAMIN D3) 5000 units TABS Take 5,000 Units by mouth daily.   Yes [provider]  fenofibrate (TRICOR) 145 MG tablet Take 1 tablet (145 mg total) by mouth daily. 09/10/15  Yes Lorie Phenix, MD  fluticasone [facility]) 50 MCG/ACT nasal spray  USE ONE SPRAY IN EACH NOSTRIL DAILY 09/27/15  Yes Lorie Phenix, MD  HUMULIN 70/30 KWIKPEN (70-30) 100 UNIT/ML PEN Inject 18 Units into the skin 2 (two) times daily.  06/11/17  Yes [provider]  HYDROcodone-acetaminophen (NORCO/VICODIN) 5-325 MG tablet Take 1 tablet by mouth 2 (two) times daily. As needed for chronic low back pain  07/18/15  Yes Lorie Phenix, MD  hydrOXYzine (ATARAX/VISTARIL) 25 MG tablet Take 25 mg by mouth 4 (four) times daily as needed for itching. 06/05/17  Yes [provider]  lactobacillus acidophilus (BACID) TABS tablet Take 2 tablets by mouth daily.   Yes [provider]  levothyroxine (SYNTHROID, LEVOTHROID) 50 MCG tablet Take 1 tablet (50 mcg total) by mouth daily. PATIENT NEEDS TO SCHEDULE OFFICE VISIT FOR FOLLOW UP 02/01/16  Yes Malva Limes, MD  metoprolol tartrate (LOPRESSOR) 100 MG tablet Take 1 tablet (100 mg total) by mouth 2 (two) times daily. 07/03/17  Yes Milagros Loll, MD  Potassium 99 MG TABS Take 1 tablet by mouth daily.   Yes [provider]  VENTOLIN HFA 108 (90 Base) MCG/ACT inhaler Inhale 2 puffs into the lungs every 6 (six) hours as needed. 05/24/17  Yes [provider]  vitamin B-12 (CYANOCOBALAMIN) 1000 MCG tablet Take 1,000 mcg by mouth daily.   Yes [provider]    Allergies Diphenhydramine; Metformin; Oxybutynin; Amlodipine; Ciprofloxacin; Lisinopril; Penicillins; and Saxagliptin  Family History  Problem Relation Age of Onset  . Breast cancer Mother   . Pancreatitis Father   . Breast cancer Sister   . Lung cancer Brother   . Melanoma Brother   . Throat cancer Brother     Social History Social History   Tobacco Use  . Smoking status: Former Smoker    Packs/day: 1.00    Years: 30.00    Pack years: 30.00    Last attempt to quit: 11/30/1999    Years since quitting: 17.6  . Smokeless tobacco: Never Used  Substance Use Topics  . Alcohol use: No  . Drug use: No    Review of Systems Constitutional: No fever/chills Eyes: No visual changes. ENT: No sore throat. Cardiovascular: Denies chest pain. Respiratory: Positive for shortness of breath. Gastrointestinal: No abdominal pain.  No nausea, no vomiting.  No diarrhea.  No constipation. Genitourinary: Negative for dysuria. Musculoskeletal: Negative for neck pain.   Negative for back pain. Integumentary: Negative for rash. Neurological: Negative for headaches, focal weakness or numbness.   ____________________________________________   PHYSICAL EXAM:  VITAL SIGNS: ED Triage Vitals  Enc Vitals Group     BP 08/07/17 0020 103/75     Pulse Rate 08/07/17 0020 (!) 105     Resp 08/07/17 0020 (!) 26     Temp --      Temp Source 08/07/17 0020 Axillary     SpO2 08/07/17 0020 100 %     Weight 08/07/17 0021 69.4 kg (153 lb)     Height 08/07/17 0021 1.575 m (5\' 2" )     Head Circumference --      Peak Flow --      Pain Score 08/07/17 0021 0     Pain Loc --      Pain Edu? --      Excl. in GC? --     Constitutional: Alert and oriented.  Apparent respiratory distress  eyes: Conjunctivae are normal.  Head: Atraumatic. Mouth/Throat: Mucous membranes are dry.  Oropharynx non-erythematous. Neck: No stridor.   Cardiovascular: Tachycardia, regular rhythm. Good  peripheral circulation. Grossly normal heart sounds. Respiratory: Tachypnea, positive accessory respiratory muscle use, diffuse rhonchi  Gastrointestinal: Soft and nontender. No distention.  Musculoskeletal: No lower extremity tenderness nor edema. No gross deformities of extremities. Neurologic:  Normal speech and language. No gross focal neurologic deficits are appreciated.  Skin:  Skin is warm, dry and intact. No rash noted. Psychiatric: Anxious affect. ____________________________________________   LABS (all labs ordered are listed, but only abnormal results are displayed)  Labs Reviewed  BASIC METABOLIC PANEL - Abnormal; Notable for the following components:      Result Value   Glucose, Bld 246 (*)    BUN 26 (*)    Creatinine, Ser 1.69 (*)    GFR calc non Af Amer 26 (*)    GFR calc Af Amer 30 (*)    All other components within normal limits  CBC - Abnormal; Notable for the following components:   WBC 17.0 (*)    MCHC 31.8 (*)    RDW 16.2 (*)    All other components within normal  limits  TROPONIN I - Abnormal; Notable for the following components:   Troponin I 0.38 (*)    All other components within normal limits  BLOOD GAS, VENOUS - Abnormal; Notable for the following components:   pH, Ven 7.15 (*)    pCO2, Ven 70 (*)    pO2, Ven 136.0 (*)    Acid-base deficit 5.9 (*)    All other components within normal limits  BLOOD GAS, ARTERIAL - Abnormal; Notable for the following components:   pH, Arterial 7.28 (*)    pCO2 arterial 49 (*)    pO2, Arterial 232 (*)    Acid-base deficit 4.1 (*)    All other components within normal limits   ____________________________________________  EKG  ED ECG REPORT I, Kennard N Britanny Marksberry, the attending physician, personally viewed and interpreted this ECG.   Date: 08/07/2017  EKG Time: 12:14 AM  Rate: 108  Rhythm: Sinus tachycardia with left bundle branch block  Axis: Normal  Intervals: Normal  ST&T Change: None  ____________________________________________  RADIOLOGY I,  N Isaiah Torok, personally viewed and evaluated these images (plain radiographs) as part of my medical decision making, as well as reviewing the written report by the radiologist.  ED MD interpretation: Hyperinflated lung fields concerning for COPD  Official radiology report(s):   .Critical Care Performed by: Darci Current, MD Authorized by: Darci Current, MD   Critical care provider statement:    Critical care time (minutes):  40   Critical care start time:  08/07/2017 12:10 AM   Critical care end time:  08/07/2017 12:50 AM   Critical care time was exclusive of:  Separately billable procedures and treating other patients   Critical care was time spent personally by me on the following activities:  Development of treatment plan with patient or surrogate, discussions with consultants, evaluation of patient's response to treatment, examination of patient, obtaining history from patient or surrogate, ordering and performing treatments and  interventions, ordering and review of laboratory studies, ordering and review of radiographic studies, pulse oximetry, re-evaluation of patient's condition and review of old charts   I assumed direction of critical care for this patient from another provider in my specialty: no       ____________________________________________   INITIAL IMPRESSION / ASSESSMENT AND PLAN / ED COURSE  As part of my medical decision making, I reviewed the following data within the electronic MEDICAL RECORD NUMBER   84 year old female presented with  above-stated history and physical exam secondary to respiratory distress.  BiPAP applied to the patient shortly after arrival to the emergency department.  Concern for pneumonia, COPD, pulmonary emboli, pulmonary edema.  Chest x-ray consistent with possible COPD and interstitial edema.  Patient noted to be markedly hypertensive on arrival as such Nitropaste was applied.  Laboratory data notable for hypercarbia, leukocytosis, pH of 7.15 patient received multiple DuoNeb's via BiPAP.  Patient also given IV ceftriaxone 1 g and azithromycin 500 mg IV.  CT scan of the chest was performed which revealed emphysematous changes with possible left lower lobe pneumonia.  Patient's Risser status markedly improved while on BiPAP and following of stated interventions.  Patient discussed with Dr. Sheryle Hail for hospital admission for further evaluation and management    FINAL CLINICAL IMPRESSION(S) / ED DIAGNOSES  Final diagnoses:  Acute respiratory failure with hypoxia and hypercarbia (HCC)  Chronic obstructive pulmonary disease, unspecified COPD type (HCC)  Community acquired pneumonia of left lower lobe of lung (HCC)     MEDICATIONS GIVEN DURING THIS VISIT:  Medications  nitroGLYCERIN (NITROGLYN) 2 % ointment 0.5 inch (0.5 inches Topical Given 08/07/17 0030)  albuterol (PROVENTIL) (2.5 MG/3ML) 0.083% nebulizer solution 2.5 mg (2.5 mg Nebulization Given 08/07/17 0030)  albuterol  (PROVENTIL) (2.5 MG/3ML) 0.083% nebulizer solution 2.5 mg (2.5 mg Nebulization Given 08/07/17 0030)  albuterol (PROVENTIL) (2.5 MG/3ML) 0.083% nebulizer solution 2.5 mg (2.5 mg Nebulization Given 08/07/17 0030)  cefTRIAXone (ROCEPHIN) 1 g in sodium chloride 0.9 % 100 mL IVPB (0 g Intravenous Stopped 08/07/17 0510)  azithromycin (ZITHROMAX) 500 mg in sodium chloride 0.9 % 250 mL IVPB (0 mg Intravenous Stopped 08/07/17 0510)     ED Discharge Orders    None       Note:  This document was prepared using Dragon voice recognition software and may include unintentional dictation errors.    Darci Current, MD 08/07/17 1610    Darci Current, MD 08/07/17 [phone]

## 2017-08-08 DIAGNOSIS — J181 Lobar pneumonia, unspecified organism: Principal | ICD-10-CM

## 2017-08-08 DIAGNOSIS — J9601 Acute respiratory failure with hypoxia: Secondary | ICD-10-CM

## 2017-08-08 DIAGNOSIS — R748 Abnormal levels of other serum enzymes: Secondary | ICD-10-CM | POA: Diagnosis not present

## 2017-08-08 DIAGNOSIS — J9602 Acute respiratory failure with hypercapnia: Secondary | ICD-10-CM

## 2017-08-08 DIAGNOSIS — I482 Chronic atrial fibrillation: Secondary | ICD-10-CM | POA: Diagnosis not present

## 2017-08-08 DIAGNOSIS — I509 Heart failure, unspecified: Secondary | ICD-10-CM

## 2017-08-08 LAB — GLUCOSE, CAPILLARY
GLUCOSE-CAPILLARY: 270 mg/dL — AB (ref 65–99)
Glucose-Capillary: 253 mg/dL — ABNORMAL HIGH (ref 65–99)
Glucose-Capillary: 269 mg/dL — ABNORMAL HIGH (ref 65–99)
Glucose-Capillary: 302 mg/dL — ABNORMAL HIGH (ref 65–99)

## 2017-08-08 LAB — BLOOD GAS, ARTERIAL
ACID-BASE DEFICIT: 0.2 mmol/L (ref 0.0–2.0)
BICARBONATE: 24.8 mmol/L (ref 20.0–28.0)
FIO2: 0.3
O2 SAT: 90.7 %
PCO2 ART: 41 mmHg (ref 32.0–48.0)
PH ART: 7.39 (ref 7.350–7.450)
Patient temperature: 37
pO2, Arterial: 61 mmHg — ABNORMAL LOW (ref 83.0–108.0)

## 2017-08-08 LAB — HEMOGLOBIN A1C
Hgb A1c MFr Bld: 8.4 % — ABNORMAL HIGH (ref 4.8–5.6)
Mean Plasma Glucose: 194 mg/dL

## 2017-08-08 LAB — TROPONIN I
Troponin I: 0.39 ng/mL (ref <0.03)
Troponin I: 0.38 ng/mL (ref <0.03)
Troponin I: 0.39 ng/mL (ref <0.03)

## 2017-08-08 MED ORDER — TIOTROPIUM BROMIDE MONOHYDRATE 18 MCG IN CAPS
18.0000 ug | ORAL_CAPSULE | Freq: Every day | RESPIRATORY_TRACT | Status: DC
  Administered 2017-08-08 – 2017-08-10 (×3): 18 ug via RESPIRATORY_TRACT
  Filled 2017-08-08: qty 5

## 2017-08-08 MED ORDER — HYDROCODONE-ACETAMINOPHEN 5-325 MG PO TABS
1.0000 | ORAL_TABLET | Freq: Four times a day (QID) | ORAL | Status: DC | PRN
  Administered 2017-08-08 – 2017-08-14 (×4): 1 via ORAL
  Filled 2017-08-08 (×8): qty 1

## 2017-08-08 MED ORDER — METHYLPREDNISOLONE SODIUM SUCC 40 MG IJ SOLR
40.0000 mg | Freq: Three times a day (TID) | INTRAMUSCULAR | Status: DC
  Administered 2017-08-08 – 2017-08-13 (×16): 40 mg via INTRAVENOUS
  Filled 2017-08-08 (×16): qty 1

## 2017-08-08 MED ORDER — ORAL CARE MOUTH RINSE
15.0000 mL | Freq: Two times a day (BID) | OROMUCOSAL | Status: DC
  Administered 2017-08-08 – 2017-08-14 (×8): 15 mL via OROMUCOSAL

## 2017-08-08 MED ORDER — GUAIFENESIN-DM 100-10 MG/5ML PO SYRP
10.0000 mL | ORAL_SOLUTION | ORAL | Status: DC | PRN
  Administered 2017-08-08 – 2017-08-09 (×4): 10 mL via ORAL
  Filled 2017-08-08 (×5): qty 10

## 2017-08-08 MED ORDER — GUAIFENESIN-DM 100-10 MG/5ML PO SYRP
5.0000 mL | ORAL_SOLUTION | ORAL | Status: DC | PRN
  Administered 2017-08-08: 5 mL via ORAL
  Filled 2017-08-08 (×2): qty 5

## 2017-08-08 NOTE — Plan of Care (Signed)
Pt alert and oriented. No complaints of pain or discomfort. Remains on 1L O2 acute. Cardiology consult pending for elevated troponins. Will continue to monitor.

## 2017-08-08 NOTE — Progress Notes (Signed)
[place] [facility] [place] NAME: Peggy [name]    MR#:  [id number]  DATE OF BIRTH:  [birth year]  SUBJECTIVE:  CHIEF COMPLAINT: Shortness of breath is better but cough still coughing.  2 daughters and granddaughter at bedside  REVIEW OF SYSTEMS:  CONSTITUTIONAL: No fever, fatigue or weakness.  EYES: No blurred or double vision.  EARS, NOSE, AND THROAT: No tinnitus or ear pain.  RESPIRATORY: improving cough, shortness of breath, denies wheezing or hemoptysis.  CARDIOVASCULAR: No chest pain, orthopnea, edema.  GASTROINTESTINAL: No nausea, vomiting, diarrhea or abdominal pain.  GENITOURINARY: No dysuria, hematuria.  ENDOCRINE: No polyuria, nocturia,  HEMATOLOGY: No anemia, easy bruising or bleeding SKIN: No rash or lesion. MUSCULOSKELETAL: No joint pain or arthritis.   NEUROLOGIC: No tingling, numbness, weakness.  PSYCHIATRY: No anxiety or depression.   DRUG ALLERGIES:   Allergies  Allergen Reactions  . Diphenhydramine Rash    AGITATION/DELIRIUM  . Metformin Diarrhea  . Oxybutynin Other (See Comments)    dizzy  . Amlodipine Rash  . Ciprofloxacin Rash  . Lisinopril Rash  . Penicillins Rash    Family states was a "long time ago"  . Saxagliptin Rash    VITALS:  Blood pressure (!) 135/48, pulse 77, temperature 97.9 F (36.6 C), temperature source Oral, resp. rate 20, height 5' 2"  (1.575 m), weight 72.6 kg (160 lb 1.6 oz), SpO2 94 %.  PHYSICAL EXAMINATION:  GENERAL:  84 y.o.-year-old patient lying in the bed with no acute distress.  EYES: Pupils equal, round, reactive to light and accommodation. No scleral icterus. Extraocular muscles intact.  HEENT: Head atraumatic, normocephalic. Oropharynx and nasopharynx clear.  NECK:  Supple, no jugular venous distention. No thyroid enlargement, no tenderness.  LUNGS: Moderate breath sounds bilaterally, minimal end expiratory wheezing, no rales,rhonchi or crepitation. No use of accessory muscles of  respiration.  CARDIOVASCULAR: S1, S2 normal. No murmurs, rubs, or gallops.  ABDOMEN: Soft, nontender, nondistended. Bowel sounds present. No organomegaly or mass.  EXTREMITIES: No pedal edema, cyanosis, or clubbing.  NEUROLOGIC: Cranial nerves II through XII are intact. Muscle strength 5/5 in all extremities. Sensation intact. Gait not checked.  PSYCHIATRIC: The patient is alert and oriented x 3.  SKIN: No obvious rash, lesion, or ulcer.    LABORATORY PANEL:   CBC Recent Labs  Lab 08/07/17 0017  WBC 17.0*  HGB 13.3  HCT 41.8  PLT 416   ------------------------------------------------------------------------------------------------------------------  Chemistries  Recent Labs  Lab 08/03/17 1610 08/07/17 0017  NA 139 137  K 4.2 5.1  CL 105 105  CO2 24 22  GLUCOSE 179* 246*  BUN 25* 26*  CREATININE 1.23* 1.69*  CALCIUM 10.0 9.4  MG 1.7  --   AST 31  --   ALT 27  --   ALKPHOS 37*  --   BILITOT 0.6  --    ------------------------------------------------------------------------------------------------------------------  Cardiac Enzymes Recent Labs  Lab 08/08/17 1151  TROPONINI 0.38*   ------------------------------------------------------------------------------------------------------------------  RADIOLOGY:  Ct Chest Wo Contrast  Result Date: 08/07/2017 CLINICAL DATA:  Chest pain or shortness of breath. Respiratory distress, progressive. EXAM: CT CHEST WITHOUT CONTRAST TECHNIQUE: Multidetector CT imaging of the chest was performed following the standard protocol without IV contrast. COMPARISON:  Radiographs earlier this day as well as 08/03/2017. Chest CT 03/06/2017 FINDINGS: Cardiovascular: Atherosclerosis of the thoracic aorta. No aneurysm. No periaortic stranding. There are coronary artery calcifications/stents. No pericardial effusion. Mediastinum/Nodes: Small mediastinal nodes largest measuring 9 mm short axis. Limited assessment for hilar  adenopathy given lack of  IV contrast. No bulky hilar adenopathy. The esophagus is decompressed. Lungs/Pleura: Mild emphysema. Central bronchial thickening with mucus/debris in the left mainstem and lower lobe bronchus. Short-segment mucous plugging/bronchial occlusion in the left lower lobe. Patchy ground-glass left lower lobe opacity, partially obscured by breathing motion. There is mild smooth septal thickening suggesting pulmonary edema. Biapical pleuroparenchymal scarring. There are multiple small pulmonary nodules, some of which are calcified. The largest measures 8 mm in the superior segments of the left lower lobe image 35 series 4. This is unchanged dating back to 2015 and considered benign. No new pulmonary nodule or mass. No pleural effusion. Upper Abdomen: Simple cyst in the left lobe of the liver. No acute finding. Musculoskeletal: There are no acute or suspicious osseous abnormalities. IMPRESSION: 1. Mild emphysema with bronchial thickening and scattered debris/mucus in the left lower lobe bronchus. Areas of mucous plugging/bronchial occlusion in the left lower lobe. Patchy left lower lobe ground-glass opacities may be postobstructive atelectasis or pneumonia. 2. Smooth septal thickening consistent with pulmonary edema. 3. Benign pulmonary nodules, stable dating back to 2015 CT. Biapical pleuroparenchymal scarring is unchanged. 4. No pleural effusion. Aortic Atherosclerosis (ICD10-I70.0) and Emphysema (ICD10-J43.9). Electronically Signed   By: Jeb Levering M.D.   On: 08/07/2017 02:47   Dg Chest Port 1 View  Result Date: 08/07/2017 CLINICAL DATA:  Initial evaluation for acute respiratory distress. EXAM: PORTABLE CHEST 1 VIEW COMPARISON:  Prior radiograph from 08/03/2017. FINDINGS: Mild cardiomegaly, stable.  Mediastinal silhouette normal. Lungs normally inflated. Underlying COPD. There is subtly increased prominence of the interstitial markings as compared to previous, suggesting pulmonary interstitial edema. No  consolidative airspace disease. No pleural effusion. No pneumothorax. No acute osseous abnormality. IMPRESSION: 1. Diffuse prominence of the interstitial markings, consistent with mild diffuse pulmonary interstitial edema. 2. Underlying chronic bronchitic changes/COPD. Electronically Signed   By: Jeannine Boga M.D.   On: 08/07/2017 00:56    EKG:   Orders placed or performed during the hospital encounter of 08/07/17  . ED EKG within 10 minutes  . ED EKG within 10 minutes  . EKG 12-Lead  . EKG 12-Lead    ASSESSMENT AND PLAN:    This is an 84 year old female admitted for respiratory failure.  1. Acute hypoxic respiratory failuremre consistent with COPD with  acute bronchitis some interstitial edema.  She is currently off BiPAP   ceftriaxone and azithromycin to cover community-acquired pneumonia was started and changed to Ceftin The patient met criteria for sepsis via leukocytosis and tachypnea but the latter improves with BiPAP.  2. Diabetes mellitus type 2: Continue basal insulin therapy along with sliding scale insulin while hospitalized. Hold oral hypoglycemic agents.  3. Atrial fibrillation: Paroxysmal; continue Eliquis. Also continue amiodarone Troponin 0 0.39-0.38, patient is asymptomatic  4. Hypertension: Initially uncontrolled. Now her blood pressure is within normal limits although it is trending downward. Despite improved tachypnea the patient may be developing septic shock. Continue to monitor pressure. Hydrate aggressively.  5. Hypothyroidism: nml TSH; continue Synthroid  6. Hyperlipidemia: Continue statin therapy  7. DVT prophylaxis: Full dose anticoagulation as above 8. GI prophylaxis: None      All the records are reviewed and case discussed with Care Management/Social Workerr. Management plans discussed with the patient, family and they are in agreement.  CODE STATUS: fc   TOTAL TIME TAKING CARE OF THIS PATIENT: 36  minutes.    POSSIBLE D/C IN 2  DAYS, DEPENDING ON CLINICAL CONDITION.  Note: This dictation was prepared with Viviann Spare  dictation along with smaller phrase technology. Any transcriptional errors that result from this process are unintentional.   Nicholes Mango M.D on 08/08/2017 at 3:16 PM  Between 7am to 6pm - Pager - [phone] After 6pm go to [email or web address] - password [email or web address] [facility]  [facility]  Office  [phone]  CC: Primary care physician; Kirk Ruths, MD

## 2017-08-08 NOTE — H&P (Signed)
Pulmonary Critical Care  Initial Consult Note  Peggy [name] MQK:[id number] DOB: 02-10-1929 DOA: 08/07/2017  Referring physician: Dr Sheryle Hail  Chief Complaint: SOB  HPI: Peggy [name] is a 84 y.o. female with a history of chronic atrial fibrillation recently was admitted and discharged in February presented to the hospital with increased SOB. Patinet had been getting worse for about a week. Patient had some cough noted and also had some sputum production. At the time of presentation the sputum was noted to be thick. Last spiro done in 2016 was actually within normal limits. On admission this time patient was noted to have some hypoxia on the ABG CT chest was done and this shows some mucus plugs possibly and infiltrate. Since then has been started on abx and had also been started on BIPAP. Patient had been started on zithromax and rocephin. Since that time has been showing some improvement in the cough as well as the SOB  Review of Systems:  Constitutional:  No weight loss, night sweats, Fevers, chills, +fatigue.  HEENT:  No headaches, nasal congestion, post nasal drip,  Cardio-vascular:  No chest pain, Orthopnea, PND, swelling in lower extremities, anasarca, dizziness, palpitations  GI:  No heartburn, indigestion, abdominal pain, nausea, vomiting, diarrhea  Resp:  +shortness of breath . +productive cough, No coughing up of blood. +wheezing Skin:  no rash or lesions.  Musculoskeletal:  No joint pain or swelling.   Remainder ROS performed and is unremarkable other than noted in HPI  Past Medical History:  Diagnosis Date  . A-fib (HCC)   . Allergy   . Arthritis   . Diabetes mellitus without complication (HCC)   . GERD (gastroesophageal reflux disease)   . Hyperlipidemia   . Hypertension   . TIA (transient ischemic attack)    Past Surgical History:  Procedure Laterality Date  . ABDOMINAL HYSTERECTOMY  1970  . BACK SURGERY  2013  . colectomy Right 10/08/2013   Dr. Egbert Garibaldi  . CORONARY  ANGIOPLASTY WITH STENT PLACEMENT     Social History:  reports that she quit smoking about 17 years ago. She has a 30.00 pack-year smoking history. she has never used smokeless tobacco. She reports that she does not drink alcohol or use drugs.  Allergies  Allergen Reactions  . Diphenhydramine Rash    AGITATION/DELIRIUM  . Metformin Diarrhea  . Oxybutynin Other (See Comments)    dizzy  . Amlodipine Rash  . Ciprofloxacin Rash  . Lisinopril Rash  . Penicillins Rash    Family states was a "long time ago"  . Saxagliptin Rash    Family History  Problem Relation Age of Onset  . Breast cancer Mother   . Pancreatitis Father   . Breast cancer Sister   . Lung cancer Brother   . Melanoma Brother   . Throat cancer Brother     Prior to Admission medications   Medication Sig Start Date End Date Taking? Authorizing Provider  amiodarone (PACERONE) 400 MG tablet Take 0.5 tablets (200 mg total) by mouth 2 (two) times daily. Patient taking differently: Take 200 mg by mouth at bedtime.  07/03/17  Yes Sudini, Wardell Heath, MD  apixaban (ELIQUIS) 2.5 MG TABS tablet Take 1 tablet (2.5 mg total) by mouth 2 (two) times daily. 07/04/17  Yes Salary, Evelena Asa, MD  atorvastatin (LIPITOR) 40 MG tablet Take 1 tablet (40 mg total) by mouth daily at 6 PM. 12/10/16  Yes Wieting, Richard, MD  cefdinir (OMNICEF) 300 MG capsule Take 300 mg by mouth  2 (two) times daily. 08/01/17 08/11/17 Yes [provider]  Cholecalciferol (VITAMIN D3) 5000 units TABS Take 5,000 Units by mouth daily.   Yes [provider]  fenofibrate (TRICOR) 145 MG tablet Take 1 tablet (145 mg total) by mouth daily. 09/10/15  Yes Lorie Phenix, MD  fluticasone [facility]) 50 MCG/ACT nasal spray USE ONE SPRAY IN EACH NOSTRIL DAILY 09/27/15  Yes Lorie Phenix, MD  HUMULIN 70/30 KWIKPEN (70-30) 100 UNIT/ML PEN Inject 18 Units into the skin 2 (two) times daily.  06/11/17  Yes [provider]  HYDROcodone-acetaminophen (NORCO/VICODIN) 5-325  MG tablet Take 1 tablet by mouth 2 (two) times daily. As needed for chronic low back pain 07/18/15  Yes Lorie Phenix, MD  hydrOXYzine (ATARAX/VISTARIL) 25 MG tablet Take 25 mg by mouth 4 (four) times daily as needed for itching. 06/05/17  Yes [provider]  lactobacillus acidophilus (BACID) TABS tablet Take 2 tablets by mouth daily.   Yes [provider]  levothyroxine (SYNTHROID, LEVOTHROID) 50 MCG tablet Take 1 tablet (50 mcg total) by mouth daily. PATIENT NEEDS TO SCHEDULE OFFICE VISIT FOR FOLLOW UP 02/01/16  Yes Malva Limes, MD  metoprolol tartrate (LOPRESSOR) 100 MG tablet Take 1 tablet (100 mg total) by mouth 2 (two) times daily. 07/03/17  Yes Milagros Loll, MD  Potassium 99 MG TABS Take 1 tablet by mouth daily.   Yes [provider]  VENTOLIN HFA 108 (90 Base) MCG/ACT inhaler Inhale 2 puffs into the lungs every 6 (six) hours as needed. 05/24/17  Yes [provider]  vitamin B-12 (CYANOCOBALAMIN) 1000 MCG tablet Take 1,000 mcg by mouth daily.   Yes [provider]   Physical Exam: Vitals:   08/08/17 0500 08/08/17 0614 08/08/17 0953 08/08/17 1309  BP:  (!) 141/47 (!) 122/46 (!) 135/48  Pulse:  76 89 77  Resp:  19 20 20   Temp:  (!) 97.5 F (36.4 C)  97.9 F (36.6 C)  TempSrc:  Oral  Oral  SpO2:  95% 95% 94%  Weight: 160 lb 1.6 oz (72.6 kg)     Height:        Wt Readings from Last 3 Encounters:  08/08/17 160 lb 1.6 oz (72.6 kg)  08/03/17 153 lb (69.4 kg)  07/04/17 152 lb 12.8 oz (69.3 kg)    General:  Appears calm and comfortable Eyes: PERRL, normal lids, irises & conjunctiva ENT: grossly normal hearing, lips & tongue Neck: no LAD, masses or thyromegaly Cardiovascular: RRR, no m/r/g. No LE edema. Respiratory: CTA bilaterally, few distant scattered rhonchi       Normal respiratory effort. Abdomen: soft, nontender Skin: no rash or induration seen on limited exam Musculoskeletal: grossly normal tone BUE/BLE Psychiatric: grossly  normal mood and affect Neurologic: grossly non-focal.          Labs on Admission:  Basic Metabolic Panel: Recent Labs  Lab 08/03/17 1610 08/07/17 0017  NA 139 137  K 4.2 5.1  CL 105 105  CO2 24 22  GLUCOSE 179* 246*  BUN 25* 26*  CREATININE 1.23* 1.69*  CALCIUM 10.0 9.4  MG 1.7  --    Liver Function Tests: Recent Labs  Lab 08/03/17 1610  AST 31  ALT 27  ALKPHOS 37*  BILITOT 0.6  PROT 7.4  ALBUMIN 4.1   No results for input(s): LIPASE, AMYLASE in the last 168 hours. No results for input(s): AMMONIA in the last 168 hours. CBC: Recent Labs  Lab 08/03/17 1610 08/07/17 0017  WBC 10.0  17.0*  HGB 13.5 13.3  HCT 41.5 41.8  MCV 82.8 83.9  PLT 286 416   Cardiac Enzymes: Recent Labs  Lab 08/07/17 0017 08/07/17 1250 08/07/17 1830 08/08/17 0055 08/08/17 1151  TROPONINI 0.38* 0.57* 0.40* 0.39* 0.38*    BNP (last 3 results) Recent Labs    06/29/17 1224  BNP 412.0*    ProBNP (last 3 results) No results for input(s): PROBNP in the last 8760 hours.  CBG: Recent Labs  Lab 08/07/17 1631 08/07/17 2006 08/08/17 0736 08/08/17 1221  GLUCAP 337* 323* 270* 253*    Radiological Exams on Admission: Ct Chest Wo Contrast  Result Date: 08/07/2017 CLINICAL DATA:  Chest pain or shortness of breath. Respiratory distress, progressive. EXAM: CT CHEST WITHOUT CONTRAST TECHNIQUE: Multidetector CT imaging of the chest was performed following the standard protocol without IV contrast. COMPARISON:  Radiographs earlier this day as well as 08/03/2017. Chest CT 03/06/2017 FINDINGS: Cardiovascular: Atherosclerosis of the thoracic aorta. No aneurysm. No periaortic stranding. There are coronary artery calcifications/stents. No pericardial effusion. Mediastinum/Nodes: Small mediastinal nodes largest measuring 9 mm short axis. Limited assessment for hilar adenopathy given lack of IV contrast. No bulky hilar adenopathy. The esophagus is decompressed. Lungs/Pleura: Mild emphysema. Central  bronchial thickening with mucus/debris in the left mainstem and lower lobe bronchus. Short-segment mucous plugging/bronchial occlusion in the left lower lobe. Patchy ground-glass left lower lobe opacity, partially obscured by breathing motion. There is mild smooth septal thickening suggesting pulmonary edema. Biapical pleuroparenchymal scarring. There are multiple small pulmonary nodules, some of which are calcified. The largest measures 8 mm in the superior segments of the left lower lobe image 35 series 4. This is unchanged dating back to 2015 and considered benign. No new pulmonary nodule or mass. No pleural effusion. Upper Abdomen: Simple cyst in the left lobe of the liver. No acute finding. Musculoskeletal: There are no acute or suspicious osseous abnormalities. IMPRESSION: 1. Mild emphysema with bronchial thickening and scattered debris/mucus in the left lower lobe bronchus. Areas of mucous plugging/bronchial occlusion in the left lower lobe. Patchy left lower lobe ground-glass opacities may be postobstructive atelectasis or pneumonia. 2. Smooth septal thickening consistent with pulmonary edema. 3. Benign pulmonary nodules, stable dating back to 2015 CT. Biapical pleuroparenchymal scarring is unchanged. 4. No pleural effusion. Aortic Atherosclerosis (ICD10-I70.0) and Emphysema (ICD10-J43.9). Electronically Signed   By: Rubye Oaks M.D.   On: 08/07/2017 02:47   Dg Chest Port 1 View  Result Date: 08/07/2017 CLINICAL DATA:  Initial evaluation for acute respiratory distress. EXAM: PORTABLE CHEST 1 VIEW COMPARISON:  Prior radiograph from 08/03/2017. FINDINGS: Mild cardiomegaly, stable.  Mediastinal silhouette normal. Lungs normally inflated. Underlying COPD. There is subtly increased prominence of the interstitial markings as compared to previous, suggesting pulmonary interstitial edema. No consolidative airspace disease. No pleural effusion. No pneumothorax. No acute osseous abnormality. IMPRESSION: 1.  Diffuse prominence of the interstitial markings, consistent with mild diffuse pulmonary interstitial edema. 2. Underlying chronic bronchitic changes/COPD. Electronically Signed   By: Rise Mu M.D.   On: 08/07/2017 00:56    EKG: Independently reviewed.  Assessment/Plan Active Problems:   Acute respiratory failure with hypoxia and hypercarbia (HCC)   Respiratory failure (HCC)   1. Acute on Chronic Respiratory failure with hypoxia Patient being treated for COPD/Pneumonia at this time My concern is also that the radiological studies could be c/w a cardiac etiology Would c/w oxygen therapy and wean as tolerated Agree with abx for now Continue with RT as tolerated  2. Possible CHF Would get  a cardiology evaluation due to likely a mixed etiology for the dyspnea  3. Elevated troponin She has had elevation in the troponin now coming down ?demand reklated cards evalaution would be of help  4. Chronic Atrial Fibrillation Rate is onctrolled   Code Status: full code   Family Communication: none  Disposition Plan: home   Time spent:  I have personally obtained a history, examined the patient, evaluated laboratory and imaging results, formulated the assessment and plan and placed orders.  The Patient requires high complexity decision making for assessment and support. Total Time Spent   Yevonne Pax, MD [facility] Pulmonary Critical Care Medicine Sleep Medicine

## 2017-08-09 ENCOUNTER — Inpatient Hospital Stay: Payer: Medicare Other

## 2017-08-09 DIAGNOSIS — I4891 Unspecified atrial fibrillation: Secondary | ICD-10-CM

## 2017-08-09 LAB — INFLUENZA PANEL BY PCR (TYPE A & B)
Influenza A By PCR: NEGATIVE
Influenza B By PCR: NEGATIVE

## 2017-08-09 LAB — BLOOD GAS, ARTERIAL
ACID-BASE DEFICIT: 1.1 mmol/L (ref 0.0–2.0)
BICARBONATE: 24.3 mmol/L (ref 20.0–28.0)
FIO2: 0.55
O2 Saturation: 87.8 %
PCO2 ART: 42 mmHg (ref 32.0–48.0)
PH ART: 7.37 (ref 7.350–7.450)
Patient temperature: 37
pO2, Arterial: 56 mmHg — ABNORMAL LOW (ref 83.0–108.0)

## 2017-08-09 LAB — MRSA PCR SCREENING: MRSA by PCR: NEGATIVE

## 2017-08-09 LAB — GLUCOSE, CAPILLARY
GLUCOSE-CAPILLARY: 265 mg/dL — AB (ref 65–99)
GLUCOSE-CAPILLARY: 226 mg/dL — AB (ref 65–99)
Glucose-Capillary: 226 mg/dL — ABNORMAL HIGH (ref 65–99)

## 2017-08-09 MED ORDER — SODIUM CHLORIDE 0.9 % IV SOLN
INTRAVENOUS | Status: AC
  Administered 2017-08-09 – 2017-08-10 (×2): via INTRAVENOUS

## 2017-08-09 MED ORDER — GUAIFENESIN-CODEINE 100-10 MG/5ML PO SOLN
10.0000 mL | Freq: Three times a day (TID) | ORAL | Status: DC | PRN
  Administered 2017-08-09 – 2017-08-10 (×3): 10 mL via ORAL
  Filled 2017-08-09 (×3): qty 10

## 2017-08-09 MED ORDER — VANCOMYCIN HCL IN DEXTROSE 750-5 MG/150ML-% IV SOLN
750.0000 mg | INTRAVENOUS | Status: DC
  Administered 2017-08-09: 18:00:00 750 mg via INTRAVENOUS
  Filled 2017-08-09: qty 150

## 2017-08-09 MED ORDER — SODIUM CHLORIDE 0.9 % IV SOLN
1.0000 g | Freq: Every day | INTRAVENOUS | Status: DC
  Administered 2017-08-09 – 2017-08-11 (×3): 1 g via INTRAVENOUS
  Filled 2017-08-09 (×4): qty 1

## 2017-08-09 NOTE — Significant Event (Signed)
Rapid Response Event Note  Overview: Time Called: 2330 Arrival Time: 2333 Event Type: Respiratory  Initial Focused Assessment:   Interventions:  Plan of Care (if not transferred): POC is to obtain baseline ABG and place patient on Bipap at night. We will reevaluate the patient in 2 hours to decide if more intervention is necessary. Will continue to assess for changes/need.  Event Summary: Name of Physician Notified: Dr. Anne Hahn at      at    Outcome: Stayed in room and stabalized     Jodelle Green

## 2017-08-09 NOTE — Progress Notes (Signed)
Chaplain responded to a RR. Nurse said they were fine. Cha prayed for Pt and Care Team. [place]. Was no long required.   08/09/17 2300  Clinical Encounter Type  Visited With Patient  Visit Type Code  Referral From Nurse  Spiritual Encounters  Spiritual Needs Prayer;Emotional

## 2017-08-09 NOTE — Progress Notes (Signed)
[facility] [place] NAME: Peggy [name]    MR#:  [id number]  DATE OF BIRTH:  [birth year]  SUBJECTIVE:  CHIEF COMPLAINT: Shortness of breath is better but still coughing asking for strong cough medicine,  2 daughters   at bedside  REVIEW OF SYSTEMS:  CONSTITUTIONAL: No fever, fatigue or weakness.  EYES: No blurred or double vision.  EARS, NOSE, AND THROAT: No tinnitus or ear pain.  RESPIRATORY: Reporting cough, improving shortness of breath, denies wheezing or hemoptysis.  CARDIOVASCULAR: No chest pain, orthopnea, edema.  GASTROINTESTINAL: No nausea, vomiting, diarrhea or abdominal pain.  GENITOURINARY: No dysuria, hematuria.  ENDOCRINE: No polyuria, nocturia,  HEMATOLOGY: No anemia, easy bruising or bleeding SKIN: No rash or lesion. MUSCULOSKELETAL: No joint pain or arthritis.   NEUROLOGIC: No tingling, numbness, weakness.  PSYCHIATRY: No anxiety or depression.   DRUG ALLERGIES:   Allergies  Allergen Reactions  . Diphenhydramine Rash    AGITATION/DELIRIUM  . Metformin Diarrhea  . Oxybutynin Other (See Comments)    dizzy  . Amlodipine Rash  . Ciprofloxacin Rash  . Lisinopril Rash  . Penicillins Rash    Family states was a "long time ago"  . Saxagliptin Rash    VITALS:  Blood pressure (!) 161/60, pulse (!) 58, temperature 98 F (36.7 C), temperature source Oral, resp. rate 15, height 5' 2"  (1.575 m), weight 72.1 kg (159 lb), SpO2 93 %.  PHYSICAL EXAMINATION:  GENERAL:  84 y.o.-year-old patient lying in the bed with no acute distress.  EYES: Pupils equal, round, reactive to light and accommodation. No scleral icterus. Extraocular muscles intact.  HEENT: Head atraumatic, normocephalic. Oropharynx and nasopharynx clear.  NECK:  Supple, no jugular venous distention. No thyroid enlargement, no tenderness.  LUNGS: Moderate breath sounds bilaterally, minimal end expiratory wheezing, no rales,rhonchi or crepitation. No use of  accessory muscles of respiration.  CARDIOVASCULAR: S1, S2 normal. No murmurs, rubs, or gallops.  ABDOMEN: Soft, nontender, nondistended. Bowel sounds present. No organomegaly or mass.  EXTREMITIES: No pedal edema, cyanosis, or clubbing.  NEUROLOGIC: Cranial nerves II through XII are intact. Muscle strength 5/5 in all extremities. Sensation intact. Gait not checked.  PSYCHIATRIC: The patient is alert and oriented x 3.  SKIN: No obvious rash, lesion, or ulcer.    LABORATORY PANEL:   CBC Recent Labs  Lab 08/07/17 0017  WBC 17.0*  HGB 13.3  HCT 41.8  PLT 416   ------------------------------------------------------------------------------------------------------------------  Chemistries  Recent Labs  Lab 08/03/17 1610 08/07/17 0017  NA 139 137  K 4.2 5.1  CL 105 105  CO2 24 22  GLUCOSE 179* 246*  BUN 25* 26*  CREATININE 1.23* 1.69*  CALCIUM 10.0 9.4  MG 1.7  --   AST 31  --   ALT 27  --   ALKPHOS 37*  --   BILITOT 0.6  --    ------------------------------------------------------------------------------------------------------------------  Cardiac Enzymes Recent Labs  Lab 08/08/17 1710  TROPONINI 0.39*   ------------------------------------------------------------------------------------------------------------------  RADIOLOGY:  No results found.  EKG:   Orders placed or performed during the hospital encounter of 08/07/17  . ED EKG within 10 minutes  . ED EKG within 10 minutes  . EKG 12-Lead  . EKG 12-Lead  . EKG 12-Lead  . EKG 12-Lead    ASSESSMENT AND PLAN:    This is an 84 year old female admitted for respiratory failure.  # . Acute hypoxic respiratory failuremre consistent with COPD with  acute bronchitis, pneumonia some interstitial edema.  She is currently off BiPAP   ceftriaxone and azithromycin to cover community-acquired pneumonia was started and changed to Ceftin The patient met criteria for sepsis via leukocytosis and tachypnea but the  latter improves with BiPAP. Incentive spirometry Repeat chest x-ray  #. Diabetes mellitus type 2: Continue basal insulin therapy along with sliding scale insulin while hospitalized. Hold oral hypoglycemic agents.  # AKI-from poor p.o. intake provide IV fluids avoid nephrotoxins and monitor renal function closely and check a.m. labs  #. Atrial fibrillation: Paroxysmal; continue Eliquis. Also continue amiodarone Troponin 0 0.57-0.40- 0.39-0.38 patient is asymptomatic.  Seen by cardiology Dr. Ubaldo Glassing.  If patient clinically improves and troponin continues to decline recommending outpatient follow-up  #. Hypertension: Initially uncontrolled. Now her blood pressure is within normal limits although it is trending downward. Despite improved tachypnea the patient may be developing septic shock. Continue to monitor pressure. Hydrate aggressively.  # Hypothyroidism: nml TSH; continue Synthroid  6. Hyperlipidemia: Continue statin therapy  7. DVT prophylaxis: Full dose anticoagulation as above 8. GI prophylaxis: None   Weakness PT assessment   All the records are reviewed and case discussed with Care Management/Social Workerr. Management plans discussed with the patient, family and they are in agreement.  CODE STATUS: fc   TOTAL TIME TAKING CARE OF THIS PATIENT: 36  minutes.   POSSIBLE D/C IN 2  DAYS, DEPENDING ON CLINICAL CONDITION.  Note: This dictation was prepared with Dragon dictation along with smaller phrase technology. Any transcriptional errors that result from this process are unintentional.   Nicholes Mango M.D on 08/09/2017 at 1:39 PM  Between 7am to 6pm - Pager - [phone] After 6pm go to [email or web address] - password [email or web address] [facility]  [facility]  Office  [phone]  CC: Primary care physician; Kirk Ruths, MD

## 2017-08-09 NOTE — Consult Note (Signed)
Cardiology Consultation Note    Patient ID: Peggy [name], MRN: [id number], DOB/AGE: 82-Jan-1930 82 y.o. Admit date: 08/07/2017   Date of Consult: 08/09/2017 Primary Physician: Lauro Regulus, MD Primary Cardiologist: Dr. Darrold Junker  Chief Complaint: Chest pain Reason for Consultation: abnormal troponin Requesting MD: Dr. Amado Coe  HPI: Peggy [name] is a 84 y.o. female with history of chronic atrial fibrillation treated with rate control and anticoagulation with apixaban at 2.5 mg twice daily, history of coronary artery disease status post PCI of the mid and distal RCA with a bare metal stent in 2009 with cardiac catheterization in 2013 revealing patent stents.  She has a history of a CVA and TIA in July 2018.  She was diagnosed with atrial fibrillation during a hospitalization at [facility] in January 2019 where she was noted to have rapid ventricular response.  She was discharged on amiodarone as well as metoprolol.  She has preserved LV function with an ejection fraction of 55-60% by echocardiogram in July 2018.  Her chads vasc score is 8.  She has a history of hyperlipidemia treated with atorvastatin and TriCor.  She presented to emergency room with complaints of shortness of breath which has been present for approximately 1 week.  She also had a cough and increased sputum production.  Patient had hypoxia on her blood gas.  Chest CT pleaded showed mucous plugs with possible infiltrate.  She was placed on antibiotics and BiPAP.  This caused some improvement.  EKG showed probable sinus tachycardia with interventricular conduction delay.  The previous electrocardiogram done several days earlier showed an more narrow complex sinus rhythm.  She currently feels back to her baseline.  She denies any chest pain.  Her shortness of breath has improved.  She is currently being treated with apixaban at 2.5 mg twice daily, amiodarone at 200 mg daily, atorvastatin 40 mg daily, fenofibrate 160 mg daily, metoprolol tartrate  100 mg twice daily.  Serum troponin has decreased to 0.38 from 6 days ago.  We will repeat this.  Her potassium is 5.1, her creatinine is 1.69 up from 1.23.  Her baseline appears to be near 1.  GFR is 26 with a baseline around 40-50.  Past Medical History:  Diagnosis Date  . A-fib (HCC)   . Allergy   . Arthritis   . Diabetes mellitus without complication (HCC)   . GERD (gastroesophageal reflux disease)   . Hyperlipidemia   . Hypertension   . TIA (transient ischemic attack)       Surgical History:  Past Surgical History:  Procedure Laterality Date  . ABDOMINAL HYSTERECTOMY  1970  . BACK SURGERY  2013  . colectomy Right 10/08/2013   Dr. Egbert Garibaldi  . CORONARY ANGIOPLASTY WITH STENT PLACEMENT       Home Meds: Prior to Admission medications   Medication Sig Start Date End Date Taking? Authorizing Provider  amiodarone (PACERONE) 400 MG tablet Take 0.5 tablets (200 mg total) by mouth 2 (two) times daily. Patient taking differently: Take 200 mg by mouth at bedtime.  07/03/17  Yes Sudini, Wardell Heath, MD  apixaban (ELIQUIS) 2.5 MG TABS tablet Take 1 tablet (2.5 mg total) by mouth 2 (two) times daily. 07/04/17  Yes Salary, Evelena Asa, MD  atorvastatin (LIPITOR) 40 MG tablet Take 1 tablet (40 mg total) by mouth daily at 6 PM. 12/10/16  Yes Wieting, Richard, MD  cefdinir (OMNICEF) 300 MG capsule Take 300 mg by mouth 2 (two) times daily. 08/01/17 08/11/17 Yes [provider]  Cholecalciferol (VITAMIN D3) 5000 units TABS Take 5,000 Units by mouth daily.   Yes [provider]  fenofibrate (TRICOR) 145 MG tablet Take 1 tablet (145 mg total) by mouth daily. 09/10/15  Yes Lorie Phenix, MD  fluticasone [facility]) 50 MCG/ACT nasal spray USE ONE SPRAY IN EACH NOSTRIL DAILY 09/27/15  Yes Lorie Phenix, MD  HUMULIN 70/30 KWIKPEN (70-30) 100 UNIT/ML PEN Inject 18 Units into the skin 2 (two) times daily.  06/11/17  Yes [provider]  HYDROcodone-acetaminophen (NORCO/VICODIN) 5-325 MG tablet Take 1  tablet by mouth 2 (two) times daily. As needed for chronic low back pain 07/18/15  Yes Lorie Phenix, MD  hydrOXYzine (ATARAX/VISTARIL) 25 MG tablet Take 25 mg by mouth 4 (four) times daily as needed for itching. 06/05/17  Yes [provider]  lactobacillus acidophilus (BACID) TABS tablet Take 2 tablets by mouth daily.   Yes [provider]  levothyroxine (SYNTHROID, LEVOTHROID) 50 MCG tablet Take 1 tablet (50 mcg total) by mouth daily. PATIENT NEEDS TO SCHEDULE OFFICE VISIT FOR FOLLOW UP 02/01/16  Yes Malva Limes, MD  metoprolol tartrate (LOPRESSOR) 100 MG tablet Take 1 tablet (100 mg total) by mouth 2 (two) times daily. 07/03/17  Yes Milagros Loll, MD  Potassium 99 MG TABS Take 1 tablet by mouth daily.   Yes [provider]  VENTOLIN HFA 108 (90 Base) MCG/ACT inhaler Inhale 2 puffs into the lungs every 6 (six) hours as needed. 05/24/17  Yes [provider]  vitamin B-12 (CYANOCOBALAMIN) 1000 MCG tablet Take 1,000 mcg by mouth daily.   Yes [provider]    Inpatient Medications:  . acidophilus  2 capsule Oral Daily  . amiodarone  200 mg Oral QHS  . apixaban  2.5 mg Oral BID  . atorvastatin  40 mg Oral q1800  . cefUROXime  500 mg Oral BID  . cholecalciferol  5,000 Units Oral Daily  . docusate sodium  100 mg Oral BID  . fenofibrate  160 mg Oral Daily  . fluticasone  1 spray Each Nare Daily  . guaiFENesin  600 mg Oral BID  . insulin aspart  0-15 Units Subcutaneous TID WC  . insulin glargine  18 Units Subcutaneous QHS  . levothyroxine  50 mcg Oral Daily  . mouth rinse  15 mL Mouth Rinse BID  . methylPREDNISolone (SOLU-MEDROL) injection  40 mg Intravenous Q8H  . metoprolol tartrate  100 mg Oral BID  . tiotropium  18 mcg Inhalation Daily  . vitamin B-12  1,000 mcg Oral Daily     Allergies:  Allergies  Allergen Reactions  . Diphenhydramine Rash    AGITATION/DELIRIUM  . Metformin Diarrhea  . Oxybutynin Other (See Comments)    dizzy  .  Amlodipine Rash  . Ciprofloxacin Rash  . Lisinopril Rash  . Penicillins Rash    Family states was a "long time ago"  . Saxagliptin Rash    Social History   Socioeconomic History  . Marital status: Widowed    Spouse name: Not on file  . Number of children: 3  . Years of education: H/S  . Highest education level: Not on file  Social Needs  . Financial resource strain: Not on file  . Food insecurity - worry: Not on file  . Food insecurity - inability: Not on file  . Transportation needs - medical: Not on file  . Transportation needs - non-medical: Not on file  Occupational History  . Occupation: Retired  Tobacco Use  . Smoking  status: Former Smoker    Packs/day: 1.00    Years: 30.00    Pack years: 30.00    Last attempt to quit: 11/30/1999    Years since quitting: 17.7  . Smokeless tobacco: Never Used  Substance and Sexual Activity  . Alcohol use: No  . Drug use: No  . Sexual activity: Not on file  Other Topics Concern  . Not on file  Social History Narrative  . Not on file     Family History  Problem Relation Age of Onset  . Breast cancer Mother   . Pancreatitis Father   . Breast cancer Sister   . Lung cancer Brother   . Melanoma Brother   . Throat cancer Brother      Review of Systems: A 12-system review of systems was performed and is negative except as noted in the HPI.  Labs: Recent Labs    08/07/17 1830 08/08/17 0055 08/08/17 1151 08/08/17 1710  TROPONINI 0.40* 0.39* 0.38* 0.39*   Lab Results  Component Value Date   WBC 17.0 (H) 08/07/2017   HGB 13.3 08/07/2017   HCT 41.8 08/07/2017   MCV 83.9 08/07/2017   PLT 416 08/07/2017    Recent Labs  Lab 08/03/17 1610 08/07/17 0017  NA 139 137  K 4.2 5.1  CL 105 105  CO2 24 22  BUN 25* 26*  CREATININE 1.23* 1.69*  CALCIUM 10.0 9.4  PROT 7.4  --   BILITOT 0.6  --   ALKPHOS 37*  --   ALT 27  --   AST 31  --   GLUCOSE 179* 246*   Lab Results  Component Value Date   CHOL 175 12/10/2016    HDL 29 (L) 12/10/2016   LDLCALC 69 12/10/2016   TRIG 387 (H) 12/10/2016   No results found for: DDIMER  Radiology/Studies:  Dg Chest 2 View  Result Date: 08/03/2017 CLINICAL DATA:  Patient had onset of palpitations last night associated with shortness of breath. The patient reports a 4 day history of cough. History of hypertension, atrial fibrillation, coronary artery disease with previous MI, previous CVA. Former smoker. EXAM: CHEST  2 VIEW COMPARISON:  Portable chest x-ray of June 29, 2017 FINDINGS: The lungs are well-expanded. The interstitial markings are coarse. There is stable biapical pleural thickening. There is no alveolar infiltrate. The heart and pulmonary vascularity are normal. There is calcification in the wall of the aortic arch and descending thoracic aorta. The bony thorax is unremarkable. IMPRESSION: Chronic bronchitic changes, stable.  No acute pneumonia nor CHF. Thoracic aortic atherosclerosis. Electronically Signed   By: David  [place] M.D.   On: 08/03/2017 17:00   Ct Chest Wo Contrast  Result Date: 08/07/2017 CLINICAL DATA:  Chest pain or shortness of breath. Respiratory distress, progressive. EXAM: CT CHEST WITHOUT CONTRAST TECHNIQUE: Multidetector CT imaging of the chest was performed following the standard protocol without IV contrast. COMPARISON:  Radiographs earlier this day as well as 08/03/2017. Chest CT 03/06/2017 FINDINGS: Cardiovascular: Atherosclerosis of the thoracic aorta. No aneurysm. No periaortic stranding. There are coronary artery calcifications/stents. No pericardial effusion. Mediastinum/Nodes: Small mediastinal nodes largest measuring 9 mm short axis. Limited assessment for hilar adenopathy given lack of IV contrast. No bulky hilar adenopathy. The esophagus is decompressed. Lungs/Pleura: Mild emphysema. Central bronchial thickening with mucus/debris in the left mainstem and lower lobe bronchus. Short-segment mucous plugging/bronchial occlusion in the left lower  lobe. Patchy ground-glass left lower lobe opacity, partially obscured by breathing motion. There is mild smooth septal  thickening suggesting pulmonary edema. Biapical pleuroparenchymal scarring. There are multiple small pulmonary nodules, some of which are calcified. The largest measures 8 mm in the superior segments of the left lower lobe image 35 series 4. This is unchanged dating back to 2015 and considered benign. No new pulmonary nodule or mass. No pleural effusion. Upper Abdomen: Simple cyst in the left lobe of the liver. No acute finding. Musculoskeletal: There are no acute or suspicious osseous abnormalities. IMPRESSION: 1. Mild emphysema with bronchial thickening and scattered debris/mucus in the left lower lobe bronchus. Areas of mucous plugging/bronchial occlusion in the left lower lobe. Patchy left lower lobe ground-glass opacities may be postobstructive atelectasis or pneumonia. 2. Smooth septal thickening consistent with pulmonary edema. 3. Benign pulmonary nodules, stable dating back to 2015 CT. Biapical pleuroparenchymal scarring is unchanged. 4. No pleural effusion. Aortic Atherosclerosis (ICD10-I70.0) and Emphysema (ICD10-J43.9). Electronically Signed   By: Rubye Oaks M.D.   On: 08/07/2017 02:47   Dg Chest Port 1 View  Result Date: 08/07/2017 CLINICAL DATA:  Initial evaluation for acute respiratory distress. EXAM: PORTABLE CHEST 1 VIEW COMPARISON:  Prior radiograph from 08/03/2017. FINDINGS: Mild cardiomegaly, stable.  Mediastinal silhouette normal. Lungs normally inflated. Underlying COPD. There is subtly increased prominence of the interstitial markings as compared to previous, suggesting pulmonary interstitial edema. No consolidative airspace disease. No pleural effusion. No pneumothorax. No acute osseous abnormality. IMPRESSION: 1. Diffuse prominence of the interstitial markings, consistent with mild diffuse pulmonary interstitial edema. 2. Underlying chronic bronchitic changes/COPD.  Electronically Signed   By: Rise Mu M.D.   On: 08/07/2017 00:56    Wt Readings from Last 3 Encounters:  08/09/17 72.1 kg (159 lb)  08/03/17 69.4 kg (153 lb)  07/04/17 69.3 kg (152 lb 12.8 oz)    EKG: Sinus rhythm with intermittent interventricular conduction delay  Physical Exam:  Blood pressure (!) 164/57, pulse (!) 59, temperature 97.6 F (36.4 C), temperature source Oral, resp. rate 15, height 5\' 2"  (1.575 m), weight 72.1 kg (159 lb), SpO2 91 %. Body mass index is 29.08 kg/m. General: Well developed, well nourished, in no acute distress. Head: Normocephalic, atraumatic, sclera non-icteric, no xanthomas, nares are without discharge.  Neck: Negative for carotid bruits. JVD not elevated. Lungs: Lateral rhonchi with occasional brief expiratory wheeze. Heart: RRR with S1 S2. No murmurs, rubs, or gallops appreciated. Abdomen: Soft, non-tender, non-distended with normoactive bowel sounds. No hepatomegaly. No rebound/guarding. No obvious abdominal masses. Msk:  Strength and tone appear normal for age. Extremities: No clubbing or cyanosis. No edema.  Distal pedal pulses are 2+ and equal bilaterally. Neuro: Alert and oriented X 3. No facial asymmetry. No focal deficit. Moves all extremities spontaneously.  Hard of hearing Psych:  Responds to questions appropriately with a normal affect.     Assessment and Plan  84 year old female with multiple medical problems including a history of CVA felt to be secondary to atrial fibrillation, history of paroxysmal atrial fibrillation currently treated with amiodarone and anticoagulated with apixaban, history of coronary disease status post bare-metal stent in the RCA in the past with patent stents by cardiac cath in 2013.  Presented with increasing shortness of breath and noted to be significantly hypoxic on presentation.  Chest CT without contrast showed mild emphysema with bronchial thickening as well as patchy left lower lobe opacities  consistent with atelectasis versus pneumonia.  There was no pleural effusion.  Chest x-ray showed diffuse prominence of interstitial markings consistent with possible diffuse pulmonary interstitial edema.  Her mild troponin elevation  is likely secondary to demand ischemia.  Clinically she does not appear to have an acute coronary syndrome.  We will repeat her electrocardiogram to determine if her interventricular conduction delay is persistent.  Would continue with amiodarone and anticoagulated with apixaban following renal function.  Elevated white count is likely secondary to steroids.  May need gentle hydration given bump in serum creatinine.  If stable and troponin continues to decline with no appreciable change in electrocardiogram, consideration for either further outpatient follow-up may be warranted.  Caren Macadam MD 08/09/2017, 10:08 AM Pager: [phone]

## 2017-08-09 NOTE — Progress Notes (Signed)
Pt declined flutter valve. States she has one at home, knows how to use it & will have her daughter bring it to hosp.

## 2017-08-09 NOTE — Progress Notes (Signed)
Pharmacy Antibiotic Note  Peggy [name] is a 84 y.o. female admitted on 08/07/2017 with pneumonia.  Pharmacy has been consulted for vancomycin and cefepime dosing.   Plan: Ke= 0.023 h-1  Vd= 41.3 L  Vancomycin 750 mg iv q 36 hours. Will follow renal function closely in the setting of ARF and adjust dosing/check levels as indicated to maintain a trough of 15-20 mcg/ml.   Cefepime 1 g iv q 24 hours.   Height: 5\' 2"  (157.5 cm) Weight: 159 lb (72.1 kg) IBW/kg (Calculated) : 50.1  Temp (24hrs), Avg:97.7 F (36.5 C), Min:97.5 F (36.4 C), Max:98 F (36.7 C)  Recent Labs  Lab 08/03/17 1610 08/07/17 0017  WBC 10.0 17.0*  CREATININE 1.23* 1.69*    Estimated Creatinine Clearance: 22.2 mL/min (A) (by C-G formula based on SCr of 1.69 mg/dL (H)).    Allergies  Allergen Reactions  . Diphenhydramine Rash    AGITATION/DELIRIUM  . Metformin Diarrhea  . Oxybutynin Other (See Comments)    dizzy  . Amlodipine Rash  . Ciprofloxacin Rash  . Lisinopril Rash  . Penicillins Rash    Family states was a "long time ago"  . Saxagliptin Rash    Antimicrobials this admission: Azithromycin/CTX 3/8 x 1 cefuroxime 3/8 >> 3/10 Vancomycin 3/10 >>  Cefepime 3/10 >.  Dose adjustments this admission:   Microbiology results: MRSA PCR: sent  Thank you for allowing pharmacy to be a part of this patient's care.  [name] 08/09/2017 3:32 PM

## 2017-08-10 LAB — BASIC METABOLIC PANEL
Anion gap: 9 (ref 5–15)
BUN: 44 mg/dL — ABNORMAL HIGH (ref 6–20)
CHLORIDE: 102 mmol/L (ref 101–111)
CO2: 25 mmol/L (ref 22–32)
CREATININE: 1.1 mg/dL — AB (ref 0.44–1.00)
Calcium: 8.8 mg/dL — ABNORMAL LOW (ref 8.9–10.3)
GFR calc non Af Amer: 44 mL/min — ABNORMAL LOW (ref >60)
GFR, EST AFRICAN AMERICAN: 51 mL/min — AB (ref >60)
Glucose, Bld: 361 mg/dL — ABNORMAL HIGH (ref 65–99)
Potassium: 4.1 mmol/L (ref 3.5–5.1)
SODIUM: 136 mmol/L (ref 135–145)

## 2017-08-10 LAB — GLUCOSE, CAPILLARY
GLUCOSE-CAPILLARY: 292 mg/dL — AB (ref 65–99)
GLUCOSE-CAPILLARY: 233 mg/dL — AB (ref 65–99)
GLUCOSE-CAPILLARY: 272 mg/dL — AB (ref 65–99)
Glucose-Capillary: 298 mg/dL — ABNORMAL HIGH (ref 65–99)
Glucose-Capillary: 183 mg/dL — ABNORMAL HIGH (ref 65–99)

## 2017-08-10 LAB — CBC
HCT: 37.2 % (ref 35.0–47.0)
HEMOGLOBIN: 12 g/dL (ref 12.0–16.0)
MCH: 26.9 pg (ref 26.0–34.0)
MCHC: 32.1 g/dL (ref 32.0–36.0)
MCV: 83.7 fL (ref 80.0–100.0)
Platelets: 319 10*3/uL (ref 150–440)
RBC: 4.45 MIL/uL (ref 3.80–5.20)
RDW: 16.7 % — ABNORMAL HIGH (ref 11.5–14.5)
WBC: 15.6 10*3/uL — ABNORMAL HIGH (ref 3.6–11.0)

## 2017-08-10 MED ORDER — INSULIN ASPART 100 UNIT/ML ~~LOC~~ SOLN
3.0000 [IU] | Freq: Three times a day (TID) | SUBCUTANEOUS | Status: DC
  Administered 2017-08-10 – 2017-08-14 (×11): 3 [IU] via SUBCUTANEOUS
  Filled 2017-08-10 (×11): qty 1

## 2017-08-10 MED ORDER — INSULIN GLARGINE 100 UNIT/ML ~~LOC~~ SOLN
20.0000 [IU] | Freq: Every day | SUBCUTANEOUS | Status: DC
  Administered 2017-08-10 – 2017-08-12 (×3): 20 [IU] via SUBCUTANEOUS
  Filled 2017-08-10 (×4): qty 0.2

## 2017-08-10 MED ORDER — IPRATROPIUM-ALBUTEROL 0.5-2.5 (3) MG/3ML IN SOLN
3.0000 mL | Freq: Four times a day (QID) | RESPIRATORY_TRACT | Status: DC
  Administered 2017-08-10 – 2017-08-14 (×15): 3 mL via RESPIRATORY_TRACT
  Filled 2017-08-10 (×16): qty 3

## 2017-08-10 MED ORDER — GUAIFENESIN-CODEINE 100-10 MG/5ML PO SOLN
10.0000 mL | Freq: Four times a day (QID) | ORAL | Status: DC | PRN
  Administered 2017-08-10 – 2017-08-13 (×6): 10 mL via ORAL
  Filled 2017-08-10 (×6): qty 10

## 2017-08-10 MED ORDER — INSULIN ASPART 100 UNIT/ML ~~LOC~~ SOLN
0.0000 [IU] | Freq: Every day | SUBCUTANEOUS | Status: DC
  Administered 2017-08-10 – 2017-08-11 (×2): 3 [IU] via SUBCUTANEOUS
  Administered 2017-08-12: 21:00:00 4 [IU] via SUBCUTANEOUS
  Administered 2017-08-13: 5 [IU] via SUBCUTANEOUS
  Filled 2017-08-10 (×4): qty 1

## 2017-08-10 NOTE — Progress Notes (Signed)
RCP responded to rapid response. Pt noted to be in resp. distress with expiratory wheezes & scattered rhonchi.O2 sat was 86 on [place] @ 6L. RN had given prn svn just before RR called. Drew ABG to establish baseline and initiated Bipap with setting of 12/6 & 5L O2 bleeding. O2 sat now 93% & pt.more comfortable. Will continue to monitor.

## 2017-08-10 NOTE — Progress Notes (Signed)
Pt requests Bipap come off.  Desat to 82% on 2L [place].  Increased to 3 L and sating 88-90% . Henriette Combs RN

## 2017-08-10 NOTE — Progress Notes (Signed)
Peggy [name] at [place] NAME: Peggy [name]    MR#:  [id number]  DATE OF BIRTH:  1929/02/01  SUBJECTIVE:  CHIEF COMPLAINT: Shortness of breath is better but still coughing Patient had hypoxic episode after a coughing spell last night and had a rapid response daughter at bedside  REVIEW OF SYSTEMS:  CONSTITUTIONAL: No fever, fatigue or weakness.  EYES: No blurred or double vision.  EARS, NOSE, AND THROAT: No tinnitus or ear pain.  RESPIRATORY: Reporting cough, improving shortness of breath, denies wheezing or hemoptysis.  CARDIOVASCULAR: No chest pain, orthopnea, edema.  GASTROINTESTINAL: No nausea, vomiting, diarrhea or abdominal pain.  GENITOURINARY: No dysuria, hematuria.  ENDOCRINE: No polyuria, nocturia,  HEMATOLOGY: No anemia, easy bruising or bleeding SKIN: No rash or lesion. MUSCULOSKELETAL: No joint pain or arthritis.   NEUROLOGIC: No tingling, numbness, weakness.  PSYCHIATRY: No anxiety or depression.   DRUG ALLERGIES:   Allergies  Allergen Reactions  . Diphenhydramine Rash    AGITATION/DELIRIUM  . Metformin Diarrhea  . Oxybutynin Other (See Comments)    dizzy  . Amlodipine Rash  . Ciprofloxacin Rash  . Lisinopril Rash  . Penicillins Rash    Family states was a "long time ago"  . Saxagliptin Rash    VITALS:  Blood pressure (!) 146/47, pulse (!) 53, temperature 97.6 F (36.4 C), temperature source Oral, resp. rate 20, height 5' 2"  (1.575 m), weight 73.2 kg (161 lb 6.4 oz), SpO2 94 %.  PHYSICAL EXAMINATION:  GENERAL:  84 y.o.-year-old patient lying in the bed with no acute distress.  EYES: Pupils equal, round, reactive to light and accommodation. No scleral icterus. Extraocular muscles intact.  HEENT: Head atraumatic, normocephalic. Oropharynx and nasopharynx clear.  NECK:  Supple, no jugular venous distention. No thyroid enlargement, no tenderness.  LUNGS: Moderate breath sounds bilaterally, minimal end expiratory  wheezing, no rales,rhonchi or crepitation. No use of accessory muscles of respiration.  CARDIOVASCULAR: S1, S2 normal. No murmurs, rubs, or gallops.  ABDOMEN: Soft, nontender, nondistended. Bowel sounds present. No organomegaly or mass.  EXTREMITIES: No pedal edema, cyanosis, or clubbing.  NEUROLOGIC: Cranial nerves II through XII are intact. Muscle strength 5/5 in all extremities. Sensation intact. Gait not checked.  PSYCHIATRIC: The patient is alert and oriented x 3.  SKIN: No obvious rash, lesion, or ulcer.    LABORATORY PANEL:   CBC Recent Labs  Lab 08/10/17 1004  WBC 15.6*  HGB 12.0  HCT 37.2  PLT 319   ------------------------------------------------------------------------------------------------------------------  Chemistries  Recent Labs  Lab 08/03/17 1610  08/10/17 1004  NA 139   < > 136  K 4.2   < > 4.1  CL 105   < > 102  CO2 24   < > 25  GLUCOSE 179*   < > 361*  BUN 25*   < > 44*  CREATININE 1.23*   < > 1.10*  CALCIUM 10.0   < > 8.8*  MG 1.7  --   --   AST 31  --   --   ALT 27  --   --   ALKPHOS 37*  --   --   BILITOT 0.6  --   --    < > = values in this interval not displayed.   ------------------------------------------------------------------------------------------------------------------  Cardiac Enzymes Recent Labs  Lab 08/08/17 1710  TROPONINI 0.39*   ------------------------------------------------------------------------------------------------------------------  RADIOLOGY:  Dg Chest 2 View  Result Date: 08/09/2017 CLINICAL DATA:  Shortness of breath. EXAM: CHEST -  2 VIEW COMPARISON:  Chest CT and chest x-ray dated August 07, 2017. FINDINGS: Stable mild cardiomegaly and interstitial pulmonary edema. Increased consolidation in the left lower lobe. No pneumothorax or large pleural effusion. No acute osseous abnormality. IMPRESSION: 1. Increasing consolidation in the left lower lobe, concerning for pneumonia. 2. Unchanged mild cardiomegaly and  pulmonary interstitial edema. Electronically Signed   By: Titus Dubin M.D.   On: 08/09/2017 13:53    EKG:   Orders placed or performed during the hospital encounter of 08/07/17  . ED EKG within 10 minutes  . ED EKG within 10 minutes  . EKG 12-Lead  . EKG 12-Lead  . EKG 12-Lead  . EKG 12-Lead    ASSESSMENT AND PLAN:    This is an 84 year old female admitted for respiratory failure.  # . Acute hypoxic respiratory failuremre consistent with COPD with  acute bronchitis, pneumonia some interstitial edema.   off BiPAP    ceftriaxone and azithromycin to cover community-acquired pneumonia changed to cefepime MRSA PCR is negative repeat chest x-ray  Repeat chest x-ray has revealed worsening of consolidation  Will get sputum culture and sensitivity-if patient can provide a sample -duo nebs every 6 hours and albuterol as needed  pulmonology Dr. Humphrey Rolls is following ID consult placed  -We will consider CT scan of the chest if no improvement clinically -the patient met criteria for sepsis via leukocytosis and tachypnea at the time of admission incentive spirometry -anti tussives and supportive treatment  #. Diabetes mellitus type 2: Continue basal insulin therapy along with sliding scale insulin while hospitalized. Hold oral hypoglycemic agents.  # AKI-from poor p.o. intake  Resolved provided IV fluids avoid nephrotoxins and monitor renal function closely  Creatinine 1.69-1.10   #. Atrial fibrillation: Paroxysmal; continue Eliquis. Also continue amiodarone Troponin 0 0.57-0.40- 0.39-0.38 patient is asymptomatic.  Seen by cardiology Dr. Ubaldo Glassing.  If patient clinically improves and troponin continues to decline recommending outpatient follow-up  #. Hypertension: Initially uncontrolled. Now her blood pressure is within normal limits although it is trending downward. Despite improved tachypnea the patient may be developing septic shock. Continue to monitor pressure. Hydrate  aggressively.  # Hypothyroidism: nml TSH; continue Synthroid  6. Hyperlipidemia: Continue statin therapy  7. DVT prophylaxis: Full dose anticoagulation as above 8. GI prophylaxis: None   Weakness PT assessment   All the records are reviewed and case discussed with Care Management/Social Workerr. Management plans discussed with the patient, family and they are in agreement.  CODE STATUS: fc   TOTAL TIME TAKING CARE OF THIS PATIENT: 36  minutes.   POSSIBLE D/C IN 2  DAYS, DEPENDING ON CLINICAL CONDITION.  Note: This dictation was prepared with Dragon dictation along with smaller phrase technology. Any transcriptional errors that result from this process are unintentional.   Nicholes Mango M.D on 08/10/2017 at 2:39 PM  Between 7am to 6pm - Pager - [phone] After 6pm go to [email or web address] - password [email or web address] [facility]  [facility]  Office  [phone]  CC: Primary care physician; Kirk Ruths, MD

## 2017-08-10 NOTE — Progress Notes (Signed)
OT Cancellation Note  Patient Details Name: JEZABELLA MATOVICH MRN: [id number] DOB: 03/30/1929   Cancelled Treatment:    Reason Eval/Treat Not Completed: Patient at procedure or test/ unavailable. Order received, chart reviewed. Upon attempt, pt unavailable, with staff in to assess pt. Will re-attempt OT evaluation at later date/time as pt is available.  Richrd Prime, MPH, MS, OTR/L ascom [phone] 08/10/17, 11:54 AM

## 2017-08-10 NOTE — Progress Notes (Signed)
Inpatient Diabetes Program Recommendations  AACE/ADA: New Consensus Statement on Inpatient Glycemic Control (2015)  Target Ranges:  Prepandial:   less than 140 mg/dL      Peak postprandial:   less than 180 mg/dL (1-2 hours)      Critically ill patients:  140 - 180 mg/dL   Results for MATHEL, BRAINARD (MRN [id number]) as of 08/10/2017 10:20  Ref. Range 08/09/2017 07:27 08/09/2017 12:08 08/09/2017 16:28 08/09/2017 20:51 08/10/2017 07:31  Glucose-Capillary Latest Ref Range: 65 - 99 mg/dL 244 (H) 010 (H) 272 (H) 292 (H) 233 (H)   Review of Glycemic Control  Diabetes history: DM Outpatient Diabetes medications: Humulin 70/30 18 units BID Current orders for Inpatient glycemic control: Lantus 18 units QHS, Novolog 0-15 units TID with meals; Solumedrol 40 mg Q8H  Inpatient Diabetes Program Recommendations: Insulin - Basal: Please consider increasing Lantus to 20 units QHS. Correction (SSI): Please consider ordering Novolog 0-5 units QHS for bedtime correction. Insulin - Meal Coverage: If patient is eating at least 50% of meals and steroids continued, please consider ordering Novolog 3 units TID with meals for meal coverage. HgbA1C: A1C 8.4% on 08/07/17 indicating an average glucose of 194 mg/dl over the past 2-3 months.  Thanks, Orlando Penner, RN, MSN, CDE Diabetes Coordinator Inpatient Diabetes Program [phone] (Team Pager from 8am to 5pm)

## 2017-08-10 NOTE — Plan of Care (Signed)
  Progressing Education: Knowledge of General Education information will improve 08/10/2017 0311 - Progressing by Peterson Lombard, RN Health Behavior/Discharge Planning: Ability to manage health-related needs will improve 08/10/2017 0311 - Progressing by Peterson Lombard, RN Clinical Measurements: Will remain free from infection 08/10/2017 0311 - Progressing by Peterson Lombard, RN Diagnostic test results will improve 08/10/2017 0311 - Progressing by Peterson Lombard, RN Respiratory complications will improve 08/10/2017 0311 - Progressing by Peterson Lombard, RN Cardiovascular complication will be avoided 08/10/2017 0311 - Progressing by Peterson Lombard, RN Activity: Risk for activity intolerance will decrease 08/10/2017 0311 - Progressing by Peterson Lombard, RN Nutrition: Adequate nutrition will be maintained 08/10/2017 0311 - Progressing by Peterson Lombard, RN Coping: Level of anxiety will decrease 08/10/2017 0311 - Progressing by Peterson Lombard, RN Elimination: Will not experience complications related to bowel motility 08/10/2017 0311 - Progressing by Peterson Lombard, RN Will not experience complications related to urinary retention 08/10/2017 0311 - Progressing by Peterson Lombard, RN Pain Managment: General experience of comfort will improve 08/10/2017 0311 - Progressing by Peterson Lombard, RN Safety: Ability to remain free from injury will improve 08/10/2017 0311 - Progressing by Peterson Lombard, RN Skin Integrity: Risk for impaired skin integrity will decrease 08/10/2017 0311 - Progressing by Peterson Lombard, RN

## 2017-08-10 NOTE — Therapy (Signed)
Pt brought her flutter valve from home.  Pt was instructed in it's use.  Pt returned demonstrated with good technique.  Pt was encouraged to use the flutter valve post SVN treatments.

## 2017-08-10 NOTE — Evaluation (Signed)
Physical Therapy Evaluation Patient Details Name: Peggy [name] MRN: [id number] DOB: 12-22-1928 Today's Date: 08/10/2017   History of Present Illness  Pt is a 84 y.o. female with a known history of A. Fib, diabetes, hyperlipidemia, hypertension, TIA. Pt with recent admission in 07/2017 and now readmitted for acute respiratory failure with hypoxia. Troponins elevated but cardiology is following pt and pt cleared to work with PT.   Clinical Impression  Pt is a pleasant 84 y/o female admitting for acute respiratory failure with hypoxia, currently on 4L of SpO2 with SpO2 fluctuating 92-97% during session. Pt reported she was IND in all ADLs and amb. With rollator at home, and utilized w/c for community distances. Pt has experienced 2 falls in the last 6 months. Pt unable to perform bed mobility, txfs, or amb. 2/2 frequent coughing bouts during any movement, which required rest breaks and pursed lip breathing to improve breathing. Pt was willing to attempt therex in bed but also required rest breaks during activity 2/2 coughing. PT recommending SNF at this time to improve pt's strength, mobility, amb., and balance. Pt agreeable. PT reiterated the importance of sitting up in chair and attempting mobility once coughing subsides. Pt would benefit from skilled PT to address above deficits and promote optimal return to PLOF.      SNF    Equipment Recommendations  None recommended by PT    Recommendations for Other Services       Precautions / Restrictions Precautions Precautions: Fall Restrictions Weight Bearing Restrictions: No      Mobility  Bed Mobility               General bed mobility comments: Pt sitting up in bed upon arrival with RN assessing vitals. Pt attempted to sit EOB with PT, howvever, she experienced coughing fit and declined further mobility but agreeable to exercises.  Transfers                 General transfer comment: Not performed, see bed mobility  note.  Ambulation/Gait Ambulation/Gait assistance: (not performed)              Stairs            Wheelchair Mobility    Modified Rankin (Stroke Patients Only)       Balance                                             Pertinent Vitals/Pain Pain Assessment: No/denies pain    Home Living Family/patient expects to be discharged to:: Private residence Living Arrangements: Children(pt's dtr) Available Help at Discharge: Available PRN/intermittently(pt's dtr works during the day.) Type of Home: House Home Access: Stairs to enter Entrance Stairs-Rails: Left Entrance Stairs-Number of Steps: 4 Home Layout: One level Home Equipment: Environmental consultant - 2 wheels;Walker - 4 wheels;Cane - single point;Bedside commode;Shower seat;Grab bars - tub/shower;Grab bars - toilet;Wheelchair - manual      Prior Function Level of Independence: Independent         Comments: Indep with ADLs, household and limited community mobility, pt states she now uses rollator at home and typically her family utilizes w/c to transport pt from car to MD appointments. Pt reports 2 falls in the last 6 months.     Hand Dominance   Dominant Hand: Right    Extremity/Trunk Assessment   Upper Extremity Assessment Upper Extremity Assessment: Generalized weakness  Lower Extremity Assessment Lower Extremity Assessment: Generalized weakness       Communication   Communication: No difficulties  Cognition Arousal/Alertness: Awake/alert Behavior During Therapy: WFL for tasks assessed/performed Overall Cognitive Status: Within Functional Limits for tasks assessed                                        General Comments      Exercises General Exercises - Lower Extremity Ankle Circles/Pumps: AROM;Both;20 reps;Supine(sitting up in bed) Gluteal Sets: Strengthening;Both;10 reps;Supine Heel Slides: Strengthening;10 reps;Supine;Both Straight Leg Raises:  Strengthening;Both;10 reps;Supine(Cues for all exercises and rest between sets 2/2 coughing.)   Assessment/Plan    PT Assessment Patient needs continued PT services  PT Problem List Decreased strength;Decreased activity tolerance;Decreased balance;Decreased mobility;Decreased knowledge of use of DME       PT Treatment Interventions DME instruction;Gait training;Stair training;Functional mobility training;Therapeutic activities;Therapeutic exercise;Balance training;Neuromuscular re-education;Patient/family education    PT Goals (Current goals can be found in the Care Plan section)  Acute Rehab PT Goals Patient Stated Goal: To get stronger. PT Goal Formulation: With patient Time For Goal Achievement: 08/24/17 Potential to Achieve Goals: Good    Frequency Min 2X/week   Barriers to discharge Decreased caregiver support(pt's dtr works during the day)      Co-evaluation               AM-PAC PT "6 Clicks" Daily Activity  Outcome Measure Difficulty turning over in bed (including adjusting bedclothes, sheets and blankets)?: A Lot(unable 2/2 coughing fit) Difficulty moving from lying on back to sitting on the side of the bed? : Unable Difficulty sitting down on and standing up from a chair with arms (e.g., wheelchair, bedside commode, etc,.)?: Unable Help needed moving to and from a bed to chair (including a wheelchair)?: Total Help needed walking in hospital room?: Total Help needed climbing 3-5 steps with a railing? : Total 6 Click Score: 7    End of Session Equipment Utilized During Treatment: Oxygen(4L) Activity Tolerance: Patient limited by fatigue;Other (comment)(incr. coughing) Patient left: in bed;with call bell/phone within reach;with bed alarm set Nurse Communication: (unable to reach RN after eval) PT Visit Diagnosis: Unsteadiness on feet (R26.81);Other abnormalities of gait and mobility (R26.89);Muscle weakness (generalized) (M62.81);History of falling (Z91.81)     Time: 1610-9604 PT Time Calculation (min) (ACUTE ONLY): 21 min   Charges:   PT Evaluation $PT Eval Low Complexity: 1 Low PT Treatments $Therapeutic Exercise: 8-22 mins   PT G Codes:        Zerita Boers, PT,DPT 08/10/17 9:43 AM     Miller,Jennifer L 08/10/2017, 9:38 AM

## 2017-08-10 NOTE — Progress Notes (Signed)
Pt started coughing and became short of breath. SPO2 88% on 4 L [place].  Gave pt breathing treatment and then cough medication.  Pt SPO2 went to 82%.  Called respiratory and then rapid when pt continued to struggle to breath.  Spoke with Dr Anne Hahn and pt placed on bipap and ABG.  Pt relaxed somewhat and fell asleep for a short while.  When she wakes up and cough, her SPO2 drops into the low 80's then slowly come up to 94%. Continue to monitor pt. Henriette Combs RN

## 2017-08-11 LAB — GLUCOSE, CAPILLARY
GLUCOSE-CAPILLARY: 345 mg/dL — AB (ref 65–99)
GLUCOSE-CAPILLARY: 360 mg/dL — AB (ref 65–99)
GLUCOSE-CAPILLARY: 269 mg/dL — AB (ref 65–99)
Glucose-Capillary: 193 mg/dL — ABNORMAL HIGH (ref 65–99)

## 2017-08-11 NOTE — Consult Note (Signed)
[facility]     Reason for Consult: PNA  Referring Physician: Nicholes Mango Date of Admission:  08/07/2017   Active Problems:   Acute respiratory failure with hypoxia and hypercarbia ([place])   Respiratory failure (HCC)   HPI: Peggy [name] is a 84 y.o. female admitted with increasing sob and cough with sputum. On admit had wbc 17 and temp 97.  CT showed possible mucus plugs and infiltrate. Started on treatment for CAP with ceftriaxone and azithro. Flu PCR neg. MRSA PCR neg. Has continued to have cough and hypoxia. Changed to cefepime 3/10. She has hx Afib, CAD, CVA, IDDM. Recent admission for a fib with RVR and dc 07/04/17.   Past Medical History:  Diagnosis Date  . A-fib ([place])   . Allergy   . Arthritis   . Diabetes mellitus without complication ([place])   . GERD (gastroesophageal reflux disease)   . Hyperlipidemia   . Hypertension   . TIA (transient ischemic attack)    Past Surgical History:  Procedure Laterality Date  . ABDOMINAL HYSTERECTOMY  1970  . BACK SURGERY  2013  . colectomy Right 10/08/2013   Dr. Marina Gravel  . CORONARY ANGIOPLASTY WITH STENT PLACEMENT     Social History   Tobacco Use  . Smoking status: Former Smoker    Packs/day: 1.00    Years: 30.00    Pack years: 30.00    Last attempt to quit: 11/30/1999    Years since quitting: 17.7  . Smokeless tobacco: Never Used  Substance Use Topics  . Alcohol use: No  . Drug use: No   Family History  Problem Relation Age of Onset  . Breast cancer Mother   . Pancreatitis Father   . Breast cancer Sister   . Lung cancer Brother   . Melanoma Brother   . Throat cancer Brother     Allergies:  Allergies  Allergen Reactions  . Diphenhydramine Rash    AGITATION/DELIRIUM  . Metformin Diarrhea  . Oxybutynin Other (See Comments)    dizzy  . Amlodipine Rash  . Ciprofloxacin Rash  . Lisinopril Rash  . Penicillins Rash    Family states was a "long time ago"  . Saxagliptin Rash    Current  antibiotics: Antibiotics Given (last 72 hours)    Date/Time Action Medication Dose Rate   08/08/17 2136 Given   cefUROXime (CEFTIN) tablet 500 mg 500 mg    08/09/17 0742 Given   cefUROXime (CEFTIN) tablet 500 mg 500 mg    08/09/17 1648 New Bag/Given   ceFEPIme (MAXIPIME) 1 g in sodium chloride 0.9 % 100 mL IVPB 1 g 200 mL/hr   08/09/17 1736 New Bag/Given   vancomycin (VANCOCIN) IVPB 750 mg/150 ml premix 750 mg 150 mL/hr   08/10/17 1746 New Bag/Given   ceFEPIme (MAXIPIME) 1 g in sodium chloride 0.9 % 100 mL IVPB 1 g 200 mL/hr      MEDICATIONS: . acidophilus  2 capsule Oral Daily  . amiodarone  200 mg Oral QHS  . apixaban  2.5 mg Oral BID  . atorvastatin  40 mg Oral q1800  . cholecalciferol  5,000 Units Oral Daily  . docusate sodium  100 mg Oral BID  . fenofibrate  160 mg Oral Daily  . fluticasone  1 spray Each Nare Daily  . insulin aspart  0-15 Units Subcutaneous TID WC  . insulin aspart  0-5 Units Subcutaneous QHS  . insulin aspart  3 Units Subcutaneous TID WC  . insulin glargine  20 Units Subcutaneous QHS  . ipratropium-albuterol  3 mL Nebulization Q6H  . levothyroxine  50 mcg Oral Daily  . mouth rinse  15 mL Mouth Rinse BID  . methylPREDNISolone (SOLU-MEDROL) injection  40 mg Intravenous Q8H  . metoprolol tartrate  100 mg Oral BID  . vitamin B-12  1,000 mcg Oral Daily    Review of Systems - 11 systems reviewed and negative per HPI   OBJECTIVE: Temp:  [97.5 F (36.4 C)-97.9 F (36.6 C)] 97.9 F (36.6 C) (03/12 1327) Pulse Rate:  [56-71] 67 (03/12 1327) Resp:  [17-20] 20 (03/12 1327) BP: (126-173)/(36-47) 155/45 (03/12 1327) SpO2:  [92 %-97 %] 93 % (03/12 1410) Weight:  [72.5 kg (159 lb 14.4 oz)] 72.5 kg (159 lb 14.4 oz) (03/12 0344) Physical Exam  Constitutional:  oriented to person, place, and time. Frail, on 2 L O2 HENT: [place]/AT, PERRLA, no scleral icterus Mouth/Throat: Oropharynx is clear and moist. No oropharyngeal exudate.  Cardiovascular: Normal rate,  regular rhythm and normal heart sounds Pulmonary/Chest: poor air movement Neck = supple, no nuchal rigidity Abdominal: Soft. Bowel sounds are normal.  exhibits no distension. There is no tenderness.  Lymphadenopathy: no cervical adenopathy. No axillary adenopathy Neurological: alert and oriented to person, place, and time.  Skin: Skin is warm and dry. No rash noted. No erythema.  Psychiatric: a normal mood and affect.  behavior is normal.    LABS: Results for orders placed or performed during the hospital encounter of 08/07/17 (from the past 48 hour(s))  Influenza panel by PCR (type A & B)     Status: None   Collection Time: 08/09/17  2:59 PM  Result Value Ref Range   Influenza A By PCR NEGATIVE NEGATIVE   Influenza B By PCR NEGATIVE NEGATIVE    Comment: (NOTE) The Xpert Xpress Flu assay is intended as an aid in the diagnosis of  influenza and should not be used as a sole basis for treatment.  This  assay is FDA approved for nasopharyngeal swab specimens only. Nasal  washings and aspirates are unacceptable for Xpert Xpress Flu testing. Performed at [facility], [address] [place], [place] [address]   Glucose, capillary     Status: Abnormal   Collection Time: 08/09/17  4:28 PM  Result Value Ref Range   Glucose-Capillary 226 (H) 65 - 99 mg/dL  Glucose, capillary     Status: Abnormal   Collection Time: 08/09/17  8:51 PM  Result Value Ref Range   Glucose-Capillary 292 (H) 65 - 99 mg/dL  MRSA PCR Screening     Status: None   Collection Time: 08/09/17  9:22 PM  Result Value Ref Range   MRSA by PCR NEGATIVE NEGATIVE    Comment:        The GeneXpert MRSA Assay (FDA approved for NASAL specimens only), is one component of a comprehensive MRSA colonization surveillance program. It is not intended to diagnose MRSA infection nor to guide or monitor treatment for MRSA infections. Performed at [facility], [address] [place], [place] [address]   Blood  gas, arterial     Status: Abnormal   Collection Time: 08/09/17 11:41 PM  Result Value Ref Range   FIO2 0.55    Delivery systems VENTURI MASK    pH, Arterial 7.37 7.350 - 7.450   pCO2 arterial 42 32.0 - 48.0 mmHg   pO2, Arterial 56 (L) 83.0 - 108.0 mmHg   Bicarbonate 24.3 20.0 - 28.0 mmol/L   Acid-base deficit  1.1 0.0 - 2.0 mmol/L   O2 Saturation 87.8 %   Patient temperature 37.0    Collection site RIGHT RADIAL    Sample type ARTERIAL DRAW    Allens test (pass/fail) PASS PASS    Comment: Performed at [facility], [address] [place], [place] [address]  Glucose, capillary     Status: Abnormal   Collection Time: 08/10/17  7:31 AM  Result Value Ref Range   Glucose-Capillary 233 (H) 65 - 99 mg/dL  CBC     Status: Abnormal   Collection Time: 08/10/17 10:04 AM  Result Value Ref Range   WBC 15.6 (H) 3.6 - 11.0 K/uL   RBC 4.45 3.80 - 5.20 MIL/uL   Hemoglobin 12.0 12.0 - 16.0 g/dL   HCT 37.2 35.0 - 47.0 %   MCV 83.7 80.0 - 100.0 fL   MCH 26.9 26.0 - 34.0 pg   MCHC 32.1 32.0 - 36.0 g/dL   RDW 16.7 (H) 11.5 - 14.5 %   Platelets 319 150 - 440 K/uL    Comment: Performed at [facility], [address] [place], [place] [address]  Basic metabolic panel     Status: Abnormal   Collection Time: 08/10/17 10:04 AM  Result Value Ref Range   Sodium 136 135 - 145 mmol/L   Potassium 4.1 3.5 - 5.1 mmol/L   Chloride 102 101 - 111 mmol/L   CO2 25 22 - 32 mmol/L   Glucose, Bld 361 (H) 65 - 99 mg/dL   BUN 44 (H) 6 - 20 mg/dL   Creatinine, Ser 1.10 (H) 0.44 - 1.00 mg/dL   Calcium 8.8 (L) 8.9 - 10.3 mg/dL   GFR calc non Af Amer 44 (L) >60 mL/min   GFR calc Af Amer 51 (L) >60 mL/min    Comment: (NOTE) The eGFR has been calculated using the CKD EPI equation. This calculation has not been validated in all clinical situations. eGFR's persistently <60 mL/min signify possible Chronic Kidney Disease.    Anion gap 9 5 - 15    Comment: Performed at [facility], [address] [place], [place] [address]  Glucose, capillary     Status: Abnormal   Collection Time: 08/10/17 11:26 AM  Result Value Ref Range   Glucose-Capillary 298 (H) 65 - 99 mg/dL  Glucose, capillary     Status: Abnormal   Collection Time: 08/10/17  5:25 PM  Result Value Ref Range   Glucose-Capillary 183 (H) 65 - 99 mg/dL  Glucose, capillary     Status: Abnormal   Collection Time: 08/10/17  8:59 PM  Result Value Ref Range   Glucose-Capillary 272 (H) 65 - 99 mg/dL  Glucose, capillary     Status: Abnormal   Collection Time: 08/11/17  7:26 AM  Result Value Ref Range   Glucose-Capillary 193 (H) 65 - 99 mg/dL  Glucose, capillary     Status: Abnormal   Collection Time: 08/11/17 11:47 AM  Result Value Ref Range   Glucose-Capillary 345 (H) 65 - 99 mg/dL   No components found for: ESR, C REACTIVE PROTEIN MICRO: Recent Results (from the past 720 hour(s))  MRSA PCR Screening     Status: None   Collection Time: 08/09/17  9:22 PM  Result Value Ref Range Status   MRSA by PCR NEGATIVE NEGATIVE Final    Comment:        The GeneXpert MRSA Assay (FDA approved for NASAL specimens only), is one component of a comprehensive MRSA colonization  surveillance program. It is not intended to diagnose MRSA infection nor to guide or monitor treatment for MRSA infections. Performed at [facility], [address] [place], [place] [address]     IMAGING: Dg Chest 2 View  Result Date: 08/09/2017 CLINICAL DATA:  Shortness of breath. EXAM: CHEST - 2 VIEW COMPARISON:  Chest CT and chest x-ray dated August 07, 2017. FINDINGS: Stable mild cardiomegaly and interstitial pulmonary edema. Increased consolidation in the left lower lobe. No pneumothorax or large pleural effusion. No acute osseous abnormality. IMPRESSION: 1. Increasing consolidation in the left lower lobe, concerning for pneumonia. 2. Unchanged mild cardiomegaly and pulmonary interstitial edema. Electronically Signed   By: Titus Dubin M.D.   On: 08/09/2017 13:53   Dg Chest 2 View  Result Date: 08/03/2017 CLINICAL DATA:  Patient had onset of palpitations last night associated with shortness of breath. The patient reports a 4 day history of cough. History of hypertension, atrial fibrillation, coronary artery disease with previous MI, previous CVA. Former smoker. EXAM: CHEST  2 VIEW COMPARISON:  Portable chest x-ray of June 29, 2017 FINDINGS: The lungs are well-expanded. The interstitial markings are coarse. There is stable biapical pleural thickening. There is no alveolar infiltrate. The heart and pulmonary vascularity are normal. There is calcification in the wall of the aortic arch and descending thoracic aorta. The bony thorax is unremarkable. IMPRESSION: Chronic bronchitic changes, stable.  No acute pneumonia nor CHF. Thoracic aortic atherosclerosis. Electronically Signed   By: Parry Po  Martinique M.D.   On: 08/03/2017 17:00   Ct Chest Wo Contrast  Result Date: 08/07/2017 CLINICAL DATA:  Chest pain or shortness of breath. Respiratory distress, progressive. EXAM: CT CHEST WITHOUT CONTRAST TECHNIQUE: Multidetector CT imaging of the chest was performed following the standard protocol without IV contrast. COMPARISON:  Radiographs earlier this day as well as 08/03/2017. Chest CT 03/06/2017 FINDINGS: Cardiovascular: Atherosclerosis of the thoracic aorta. No aneurysm. No periaortic stranding. There are coronary artery calcifications/stents. No pericardial effusion. Mediastinum/Nodes: Small mediastinal nodes largest measuring 9 mm short axis. Limited assessment for hilar adenopathy given lack of IV contrast. No bulky hilar adenopathy. The esophagus is decompressed. Lungs/Pleura: Mild emphysema. Central bronchial thickening with mucus/debris in the left mainstem and lower lobe bronchus. Short-segment mucous plugging/bronchial occlusion in the left lower lobe. Patchy ground-glass left lower lobe opacity, partially obscured by breathing motion.  There is mild smooth septal thickening suggesting pulmonary edema. Biapical pleuroparenchymal scarring. There are multiple small pulmonary nodules, some of which are calcified. The largest measures 8 mm in the superior segments of the left lower lobe image 35 series 4. This is unchanged dating back to 2015 and considered benign. No new pulmonary nodule or mass. No pleural effusion. Upper Abdomen: Simple cyst in the left lobe of the liver. No acute finding. Musculoskeletal: There are no acute or suspicious osseous abnormalities. IMPRESSION: 1. Mild emphysema with bronchial thickening and scattered debris/mucus in the left lower lobe bronchus. Areas of mucous plugging/bronchial occlusion in the left lower lobe. Patchy left lower lobe ground-glass opacities may be postobstructive atelectasis or pneumonia. 2. Smooth septal thickening consistent with pulmonary edema. 3. Benign pulmonary nodules, stable dating back to 2015 CT. Biapical pleuroparenchymal scarring is unchanged. 4. No pleural effusion. Aortic Atherosclerosis (ICD10-I70.0) and Emphysema (ICD10-J43.9). Electronically Signed   By: Jeb Levering M.D.   On: 08/07/2017 02:47   Dg Chest Port 1 View  Result Date: 08/07/2017 CLINICAL DATA:  Initial evaluation for acute respiratory distress. EXAM: PORTABLE CHEST 1 VIEW COMPARISON:  Prior radiograph from 08/03/2017. FINDINGS: Mild cardiomegaly, stable.  Mediastinal silhouette normal. Lungs normally inflated. Underlying COPD. There is subtly increased prominence of the interstitial markings as compared to previous, suggesting pulmonary interstitial edema. No consolidative airspace disease. No pleural effusion. No pneumothorax. No acute osseous abnormality. IMPRESSION: 1. Diffuse prominence of the interstitial markings, consistent with mild diffuse pulmonary interstitial edema. 2. Underlying chronic bronchitic changes/COPD. Electronically Signed   By: Jeannine Boga M.D.   On: 08/07/2017 00:56     Assessment:   Peggy [name] is a 84 y.o. female  admitted with increasing sob and cough with sputum. On admit had wbc 17 and temp 97.  CT showed possible mucus plugs and mild infiltrate. Started on treatment for CAP with ceftriaxone and azithro. Flu PCR neg. MRSA PCR neg. Has continued to have dry cough and had a desat on 3/10 with hypoxia but reports doing much better today. Changed to cefepime 3/10. She has hx Afib, CAD, CVA, IDDM. Recent admission for a fib with RVR and dc 07/04/17. No sputum has been produced. She denies any episodes of aspiration.   Recommendations Cont cefepime for HCAP. No Vanco since MRSA PCR neg.  If improving would plan on an 8 day course of treatment. Stop date 3/14. Would not be able to use levofloxacin since is on amiodarone so would continue inpatient until 3/1/4.  Thank you very much for allowing me to participate in the care of this patient. Please call with questions.   Cheral Marker. Ola Spurr, MD

## 2017-08-11 NOTE — Evaluation (Signed)
Occupational Therapy Evaluation Patient Details Name: Peggy [name] MRN: [id number] DOB: [birth year] Today's Date: 08/11/2017    History of Present Illness Pt is a 84 y.o. female with a known history of A. Fib, diabetes, hyperlipidemia, hypertension, TIA. Pt with recent admission in 07/2017 and now readmitted for acute respiratory failure with hypoxia. Troponins elevated but cardiology is following pt and pt cleared to work with therapy.    Clinical Impression   Pt seen for OT evaluation this date. Prior to admission, pt was independent with ADL and mobility, although pt and family endorse she is "supposed to use" the [facility] or rollator for mobility (pt endorses being a furniture walker). Pt drives limited distances and cooking light meals. Pt lives with her daughter in a 1 story home with tub/shower w/ grab bars, comfort height toilet, and uses the back entrance w/ 3 STE w/ L handrail for entry/exit. Pt/family endorse pt has fallen at least x2/12 months, and pt endorses being "lucky" she hasn't sustained serious injury from her falls yet. Pt currently presents with impairments in strength, activity tolerance, cardiopulmonary status, slightly decreased insight into deficits (family states pt often "over does it"), and currently on 4L O2 which is acute (no previous home O2). Pt able to perform LB ADL with min assist and bed mobility with SBA and tolerates standing for approx 1 minute w/ no LOB during static standing while RN taking vitals. BP 137/32 in long sitting in bed, standing BP 126/39. RN relayed to family/pt that she would notify MD. O2 sats dropped with mobility on 4L O2 t/o session: 92% down to 80%, increasing within 30 seconds to 93-94% once back in bed with use of PLB. Pt also endorses becoming winded and O2 sats dropping when staff assisted her to the bathroom earlier. Pt/family educated in energy conservation strategies including home/routines modifications, falls prevention, AE/DME, activity pacing,  work simplification, and pursed lip breathing. Handout provided. Pt able to return demo use of PLB. Strongly encouraged pt to take seated showers for safety/energy conservation. Pt/family verbalized understanding of all education/training provided. Pt will benefit from additional skilled OT services to address noted impairments and functional deficits in order to maximize return to PLOF and minimize future falls/injury/readmission. Recommend HHOT services upon discharge. Will continue to assess for home O2 needs.    Follow Up Recommendations  Home health OT;Supervision - Intermittent    Equipment Recommendations  Other (comment)(HH showerhead w/ on/off switch)    Recommendations for Other Services       Precautions / Restrictions Precautions Precautions: Fall Restrictions Weight Bearing Restrictions: No      Mobility Bed Mobility Overal bed mobility: Needs Assistance Bed Mobility: Supine to Sit;Sit to Supine     Supine to sit: Supervision;HOB elevated Sit to supine: Supervision   General bed mobility comments: increased effort/breathing, but no LOB, no physical assist required  Transfers Overall transfer level: Needs assistance Equipment used: None Transfers: Sit to/from Stand Sit to Stand: Min guard         General transfer comment: no LOB with static standing during BP assessment. Long sitting in bed, BP 137/32, standing BP 126/39. RN in room for vitals, noted she will notify MD.    Balance Overall balance assessment: History of Falls;Needs assistance Sitting-balance support: Feet supported;No upper extremity supported Sitting balance-Leahy Scale: Good     Standing balance support: No upper extremity supported Standing balance-Leahy Scale: Fair Standing balance comment: static standing, no LOB noted, unable to further assess dynamic due to drop  in O2 sats                           ADL either performed or assessed with clinical judgement   ADL Overall  ADL's : Needs assistance/impaired Eating/Feeding: Sitting;Supervision/ safety Eating/Feeding Details (indicate cue type and reason): VC to eat slowly and utilize PLB between bites/sips to support O2 sats Grooming: Sitting;Supervision/safety   Upper Body Bathing: Sitting;Supervision/ safety   Lower Body Bathing: Sit to/from stand;Sitting/lateral leans;Minimal assistance Lower Body Bathing Details (indicate cue type and reason): instructed in seated shower for energy conservation/safety, educated in shower chair/TTB/handheld shower head w/ on/off switch to improve safety/comfort Upper Body Dressing : Sitting;Supervision/safety   Lower Body Dressing: Sit to/from stand;Min guard Lower Body Dressing Details (indicate cue type and reason): instructed in use of AE for LB dressing for energy conservation, would benefit from trialing  Toilet Transfer: RW;Min guard;Comfort height toilet;Ambulation           Functional mobility during ADLs: Min guard;Rolling walker General ADL Comments: O2 sats drop with mobility on 4L O2 (92 down to 80%, increasing within 30 seconds to 93-94% with use of PLB on 4L O2)     Vision Baseline Vision/History: Wears glasses Wears Glasses: Reading only Patient Visual Report: No change from baseline Vision Assessment?: No apparent visual deficits     Perception     Praxis      Pertinent Vitals/Pain Pain Assessment: No/denies pain     Hand Dominance Right   Extremity/Trunk Assessment Upper Extremity Assessment Upper Extremity Assessment: Generalized weakness   Lower Extremity Assessment Lower Extremity Assessment: Generalized weakness   Cervical / Trunk Assessment Cervical / Trunk Assessment: Normal   Communication Communication Communication: No difficulties;HOH(has hearing aids)   Cognition Arousal/Alertness: Awake/alert Behavior During Therapy: WFL for tasks assessed/performed Overall Cognitive Status: Within Functional Limits for tasks  assessed                                     General Comments  O2 sats drop with mobility (stood EOB for approx 1 minute and then scooting up in bed) initially 92% down to 80% after mobility, increasing within 30 seconds to 93-94% with use of PLB, on 4L O2 t/o session    Exercises Other Exercises Other Exercises: pt/family educated in energy conservation strategies including home/routines modifications, falls prevention, AE/DME, activity pacing, work simplification, and pursed lip breathing. Handout provided. Pt able to return demo use of PLB.    Shoulder Instructions      Home Living Family/patient expects to be discharged to:: Private residence Living Arrangements: Children(daughter) Available Help at Discharge: Available PRN/intermittently(pt's dtr works during the day, pt has other family that occasionally check in on her) Type of Home: House Home Access: Stairs to enter [employer] of Steps: 3 STE w/ L rail from back (primary) entrance; 4 STE w/ bilat rails in front (pt doesn't use) Entrance Stairs-Rails: Left Home Layout: One level     Bathroom Shower/Tub: Tub/shower unit;Curtain   Bathroom Toilet: Handicapped height     Home Equipment: Environmental consultant - 2 wheels;Walker - 4 wheels;Cane - single point;Bedside commode;Shower seat;Grab bars - tub/shower;Grab bars - toilet;Transport chair;Adaptive equipment Adaptive Equipment: Reacher;Sock aid        Prior Functioning/Environment Level of Independence: Independent with assistive device(s)        Comments: Indep with ADLs, furniture walker, "supposed to use SPC/rollator  but I don't", household and limited community mobility, typically family utilizes transport chair for pt from car to MD appointments. Pt reports 2 falls in the last 6 months. No home O2.         OT Problem List: Decreased knowledge of use of DME or AE;Decreased strength;Decreased activity tolerance;Cardiopulmonary status limiting  activity;Impaired balance (sitting and/or standing);Decreased safety awareness      OT Treatment/Interventions: Self-care/ADL training;Balance training;Therapeutic exercise;Therapeutic activities;Energy conservation;DME and/or AE instruction;Patient/family education    OT Goals(Current goals can be found in the care plan section) Acute Rehab OT Goals Patient Stated Goal: get back to PLOF OT Goal Formulation: With patient/family Time For Goal Achievement: 08/25/17 Potential to Achieve Goals: Good ADL Goals Pt Will Perform Grooming: with supervision;sitting(utilizing PLB to minimize SOB, O2 sats >93% on 2L O2) Pt Will Perform Lower Body Dressing: with supervision;sit to/from stand;with adaptive equipment Pt Will Transfer to Toilet: with min guard assist;ambulating(comfort height toilet, LRAD for ambulation, O2 sats >93%) Additional ADL Goal #1: Pt/family will verbalize plan to implement at least 1 learned ECS to maximize safety and minimize SOB.  OT Frequency: Min 2X/week   Barriers to D/C:            Co-evaluation              AM-PAC PT "6 Clicks" Daily Activity     Outcome Measure Help from another person eating meals?: A Little Help from another person taking care of personal grooming?: A Little Help from another person toileting, which includes using toliet, bedpan, or urinal?: A Little Help from another person bathing (including washing, rinsing, drying)?: A Little Help from another person to put on and taking off regular upper body clothing?: A Little Help from another person to put on and taking off regular lower body clothing?: A Little 6 Click Score: 18   End of Session Equipment Utilized During Treatment: Oxygen(4L O2)  Activity Tolerance: Other (comment);Patient limited by fatigue;Patient tolerated treatment well(limited by O2sats) Patient left: in bed;with call bell/phone within reach;with bed alarm set;with family/visitor present  OT Visit Diagnosis: Other  abnormalities of gait and mobility (R26.89);Muscle weakness (generalized) (M62.81);History of falling (Z91.81)                Time: 5909-3112 OT Time Calculation (min): 54 min Charges:  OT General Charges $OT Visit: 1 Visit OT Evaluation $OT Eval Low Complexity: 1 Low OT Treatments $Self Care/Home Management : 38-52 mins  Richrd Prime, MPH, MS, OTR/L ascom [phone] 08/11/17, 10:47 AM

## 2017-08-11 NOTE — NC FL2 (Signed)
[place] MEDICAID FL2 LEVEL OF CARE SCREENING TOOL     IDENTIFICATION  Patient Name: Peggy [name] Birthdate: [birth year] Sex: female Admission Date (Current Location): 08/07/2017  [place] and [place] Number:  Chiropodist and Address:  [facility], [address] [place], [place] [address]      Provider Number: [id number]  Attending Physician Name and Address:  Ramonita Lab, MD  Relative Name and Phone Number:  Rector,Michelle Daughter [phone]  or Lilian Kapur, [phone] or     Current Level of Care: Hospital Recommended Level of Care: Skilled Nursing Facility Prior Approval Number:    Date Approved/Denied:   PASRR Number: [id number]  Discharge Plan: SNF    Current Diagnoses: Patient Active Problem List   Diagnosis Date Noted  . Acute respiratory failure with hypoxia and hypercarbia (HCC) 08/07/2017  . Respiratory failure (HCC) 08/07/2017  . Atrial fibrillation with RVR (HCC) 06/30/2017  . TIA (transient ischemic attack) 12/10/2016  . CVA (cerebral vascular accident) (HCC) 12/09/2016  . Myocardial infarction (HCC) 07/30/2015  . Urinary frequency 02/09/2015  . RUQ abdominal pain 01/26/2015  . Shortness of breath 01/26/2015  . Anxiety 01/26/2015  . Chronic pruritus 12/21/2014  . Cerebral vascular accident (HCC) 11/16/2014  . Chronic low back pain 11/16/2014  . Adult hypothyroidism 11/15/2014  . Allergic rhinitis 11/15/2014  . Body mass index (BMI) of 28.0-28.9 in adult 11/15/2014  . Arthritis 11/15/2014  . Cramps of lower extremity 11/15/2014  . Essential (primary) hypertension 11/15/2014  . Acid reflux 11/15/2014  . Hypercholesteremia 11/15/2014  . Cannot sleep 11/15/2014  . Psoriasis 11/15/2014  . Itch of skin 11/15/2014  . Restless leg 11/15/2014  . Diabetes mellitus, type 2 (HCC) 11/15/2014  . Breath shortness 11/15/2014  . Arteriosclerosis of coronary artery 02/20/2014  . AF (paroxysmal atrial  fibrillation) (HCC) 02/20/2014  . Temporary cerebral vascular dysfunction 02/20/2014  . DD (diverticular disease) 10/05/2013    Orientation RESPIRATION BLADDER Height & Weight     Self, Place  O2(4L) Continent Weight: 159 lb 14.4 oz (72.5 kg) Height:  5\' 2"  (157.5 cm)  BEHAVIORAL SYMPTOMS/MOOD NEUROLOGICAL BOWEL NUTRITION STATUS      Continent Diet(Carb Modified)  AMBULATORY STATUS COMMUNICATION OF NEEDS Skin   Limited Assist Verbally Normal                       Personal Care Assistance Level of Assistance  Bathing, Feeding, Dressing Bathing Assistance: Limited assistance Feeding assistance: Limited assistance Dressing Assistance: Limited assistance     Functional Limitations Info  Speech, Sight, Hearing Sight Info: Adequate Hearing Info: Adequate Speech Info: Adequate    SPECIAL CARE FACTORS FREQUENCY  PT (By licensed PT), OT (By licensed OT)     PT Frequency: 5x a week OT Frequency: 5x a week            Contractures Contractures Info: Not present    Additional Factors Info  Insulin Sliding Scale Code Status Info: Full Code Allergies Info: DIPHENHYDRAMINE, METFORMIN, OXYBUTYNIN, AMLODIPINE, CIPROFLOXACIN, LISINOPRIL, PENICILLINS, SAXAGLIPTIN    Insulin Sliding Scale Info: insulin aspart (novoLOG) injection 0-15 Units 3x a day with meals       Current Medications (08/11/2017):  This is the current hospital active medication list Current Facility-Administered Medications  Medication Dose Route Frequency Provider Last Rate Last Dose  . acetaminophen (TYLENOL) tablet 650 mg  650 mg Oral Q6H PRN Arnaldo Natal, MD       Or  . acetaminophen (TYLENOL) suppository  650 mg  650 mg Rectal Q6H PRN Arnaldo Natal, MD      . acidophilus (RISAQUAD) capsule 2 capsule  2 capsule Oral Daily Arnaldo Natal, MD   2 capsule at 08/11/17 0934  . albuterol (PROVENTIL) (2.5 MG/3ML) 0.083% nebulizer solution 2.5 mg  2.5 mg Nebulization Q2H PRN Salary, Montell D, MD    2.5 mg at 08/10/17 1141  . amiodarone (PACERONE) tablet 200 mg  200 mg Oral QHS Arnaldo Natal, MD   200 mg at 08/10/17 2131  . apixaban (ELIQUIS) tablet 2.5 mg  2.5 mg Oral BID Arnaldo Natal, MD   2.5 mg at 08/11/17 0934  . atorvastatin (LIPITOR) tablet 40 mg  40 mg Oral q1800 Arnaldo Natal, MD   40 mg at 08/11/17 1725  . benzonatate (TESSALON) capsule 200 mg  200 mg Oral TID PRN Oralia Manis, MD   200 mg at 08/09/17 2116  . ceFEPIme (MAXIPIME) 1 g in sodium chloride 0.9 % 100 mL IVPB  1 g Intravenous q1800 Gouru, Aruna, MD 200 mL/hr at 08/11/17 1725 1 g at 08/11/17 1725  . cholecalciferol (VITAMIN D) tablet 5,000 Units  5,000 Units Oral Daily Arnaldo Natal, MD   5,000 Units at 08/11/17 0933  . docusate sodium (COLACE) capsule 100 mg  100 mg Oral BID Arnaldo Natal, MD   100 mg at 08/09/17 2116  . fenofibrate tablet 160 mg  160 mg Oral Daily Arnaldo Natal, MD   160 mg at 08/11/17 0934  . fluticasone (FLONASE) 50 MCG/ACT nasal spray 1 spray  1 spray Each Nare Daily Arnaldo Natal, MD   1 spray at 08/09/17 0741  . guaiFENesin-codeine 100-10 MG/5ML solution 10 mL  10 mL Oral Q6H PRN Gouru, Aruna, MD   10 mL at 08/10/17 2132  . HYDROcodone-acetaminophen (NORCO/VICODIN) 5-325 MG per tablet 1 tablet  1 tablet Oral Q6H PRN Arnaldo Natal, MD   1 tablet at 08/09/17 2241  . hydrOXYzine (ATARAX/VISTARIL) tablet 25 mg  25 mg Oral QID PRN Arnaldo Natal, MD   25 mg at 08/10/17 2213  . insulin aspart (novoLOG) injection 0-15 Units  0-15 Units Subcutaneous TID WC Arnaldo Natal, MD   15 Units at 08/11/17 1726  . insulin aspart (novoLOG) injection 0-5 Units  0-5 Units Subcutaneous QHS Ramonita Lab, MD   3 Units at 08/10/17 2132  . insulin aspart (novoLOG) injection 3 Units  3 Units Subcutaneous TID WC Gouru, Aruna, MD   3 Units at 08/11/17 1728  . insulin glargine (LANTUS) injection 20 Units  20 Units Subcutaneous QHS Ramonita Lab, MD   20 Units at 08/10/17 2144  .  ipratropium-albuterol (DUONEB) 0.5-2.5 (3) MG/3ML nebulizer solution 3 mL  3 mL Nebulization Q6H Gouru, Aruna, MD   3 mL at 08/11/17 1922  . levothyroxine (SYNTHROID, LEVOTHROID) tablet 50 mcg  50 mcg Oral Daily Arnaldo Natal, MD   50 mcg at 08/11/17 0615  . MEDLINE mouth rinse  15 mL Mouth Rinse BID Angelina Ok D, MD   15 mL at 08/09/17 2118  . methylPREDNISolone sodium succinate (SOLU-MEDROL) 40 mg/mL injection 40 mg  40 mg Intravenous Q8H Gouru, Aruna, MD   40 mg at 08/11/17 1725  . metoprolol tartrate (LOPRESSOR) tablet 100 mg  100 mg Oral BID Arnaldo Natal, MD   100 mg at 08/10/17 2131  . ondansetron (ZOFRAN) tablet 4 mg  4 mg Oral Q6H PRN Arnaldo Natal, MD  Or  . ondansetron (ZOFRAN) injection 4 mg  4 mg Intravenous Q6H PRN Arnaldo Natal, MD      . vitamin B-12 (CYANOCOBALAMIN) tablet 1,000 mcg  1,000 mcg Oral Daily Arnaldo Natal, MD   1,000 mcg at 08/11/17 8119     Discharge Medications: Please see discharge summary for a list of discharge medications.  Relevant Imaging Results:  Relevant Lab Results:   Additional Information SSN [id number]  Darleene Cleaver, [place]

## 2017-08-11 NOTE — Progress Notes (Signed)
[facility] [place] NAME: Peggy [name]    MR#:  [id number]  DATE OF BIRTH:  [birth year]  SUBJECTIVE:  CHIEF COMPLAINT: Shortness of breath is better , Feels ok Patient had hypoxic episode after a coughing spell last night and had a rapid response daughter at bedside  REVIEW OF SYSTEMS:  CONSTITUTIONAL: No fever, fatigue or weakness.  EYES: No blurred or double vision.  EARS, NOSE, AND THROAT: No tinnitus or ear pain.  RESPIRATORY: Reporting cough, improving shortness of breath, denies wheezing or hemoptysis.  CARDIOVASCULAR: No chest pain, orthopnea, edema.  GASTROINTESTINAL: No nausea, vomiting, diarrhea or abdominal pain.  GENITOURINARY: No dysuria, hematuria.  ENDOCRINE: No polyuria, nocturia,  HEMATOLOGY: No anemia, easy bruising or bleeding SKIN: No rash or lesion. MUSCULOSKELETAL: No joint pain or arthritis.   NEUROLOGIC: No tingling, numbness, weakness.  PSYCHIATRY: No anxiety or depression.   DRUG ALLERGIES:   Allergies  Allergen Reactions  . Diphenhydramine Rash    AGITATION/DELIRIUM  . Metformin Diarrhea  . Oxybutynin Other (See Comments)    dizzy  . Amlodipine Rash  . Ciprofloxacin Rash  . Lisinopril Rash  . Penicillins Rash    Family states was a "long time ago"  . Saxagliptin Rash    VITALS:  Blood pressure (!) 155/45, pulse 67, temperature 97.9 F (36.6 C), temperature source Oral, resp. rate 20, height _0  (1.575 m), weight 72.5 kg (159 lb 14.4 oz), SpO2 93 %.  PHYSICAL EXAMINATION:  GENERAL:  84 y.o.-year-old patient lying in the bed with no acute distress.  EYES: Pupils equal, round, reactive to light and accommodation. No scleral icterus. Extraocular muscles intact.  HEENT: Head atraumatic, normocephalic. Oropharynx and nasopharynx clear.  NECK:  Supple, no jugular venous distention. No thyroid enlargement, no tenderness.  LUNGS: Moderate breath sounds bilaterally, minimal end expiratory wheezing,  no rales,rhonchi or crepitation. No use of accessory muscles of respiration.  CARDIOVASCULAR: S1, S2 normal. No murmurs, rubs, or gallops.  ABDOMEN: Soft, nontender, nondistended. Bowel sounds present. No organomegaly or mass.  EXTREMITIES: No pedal edema, cyanosis, or clubbing.  NEUROLOGIC: Cranial nerves II through XII are intact. Muscle strength 5/5 in all extremities. Sensation intact. Gait not checked.  PSYCHIATRIC: The patient is alert and oriented x 3.  SKIN: No obvious rash, lesion, or ulcer.    LABORATORY PANEL:   CBC Recent Labs  Lab 08/10/17 1004  WBC 15.6*  HGB 12.0  HCT 37.2  PLT 319   ------------------------------------------------------------------------------------------------------------------  Chemistries  Recent Labs  Lab 08/10/17 1004  NA 136  K 4.1  CL 102  CO2 25  GLUCOSE 361*  BUN 44*  CREATININE 1.10*  CALCIUM 8.8*   ------------------------------------------------------------------------------------------------------------------  Cardiac Enzymes Recent Labs  Lab 08/08/17 1710  TROPONINI 0.39*   ------------------------------------------------------------------------------------------------------------------  RADIOLOGY:  No results found.  EKG:   Orders placed or performed during the hospital encounter of 08/07/17  . ED EKG within 10 minutes  . ED EKG within 10 minutes  . EKG 12-Lead  . EKG 12-Lead  . EKG 12-Lead  . EKG 12-Lead    ASSESSMENT AND PLAN:    This is an 84 year old female admitted for respiratory failure.  # . Acute hypoxic respiratory failuremre consistent with COPD with  acute bronchitis, pneumonia some interstitial edema.   off BiPAP    Feeling better but weak today ceftriaxone and azithromycin to cover community-acquired pneumonia changed to cefepime MRSA PCR is negative repeat chest x-ray  Repeat chest x-ray has  revealed worsening of consolidation  Will get sputum culture and sensitivity-if patient can  provide a sample -duo nebs every 6 hours and albuterol as needed  pulmonology Dr. Humphrey Rolls is following ID consult appreciated -We will consider CT scan of the chest if no improvement clinically -the patient met criteria for sepsis via leukocytosis and tachypnea at the time of admission incentive spirometry -anti tussives and supportive treatment  #. Diabetes mellitus type 2: Continue basal insulin therapy along with sliding scale insulin while hospitalized. Hold oral hypoglycemic agents.  # AKI-from poor p.o. intake  Resolved provided IV fluids avoid nephrotoxins and monitor renal function closely  Creatinine 1.69-1.10   #. Atrial fibrillation: Paroxysmal; continue Eliquis. Also continue amiodarone Troponin 0 0.57-0.40- 0.39-0.38 patient is asymptomatic.  Seen by cardiology Dr. Ubaldo Glassing.  If patient clinically improves and troponin continues to decline recommending outpatient follow-up  #. Hypertension: Initially uncontrolled. Now her blood pressure is within normal limits although it is trending downward. Despite improved tachypnea the patient may be developing septic shock. Continue to monitor pressure. Hydrate aggressively.  # Hypothyroidism: nml TSH; continue Synthroid  6. Hyperlipidemia: Continue statin therapy  7. DVT prophylaxis: Full dose anticoagulation as above 8. GI prophylaxis: None   Weakness PT assessment   All the records are reviewed and case discussed with Care Management/Social Workerr. Management plans discussed with the patient, family and they are in agreement.  CODE STATUS: fc   TOTAL TIME TAKING CARE OF THIS PATIENT: 36  minutes.   POSSIBLE D/C IN 1  DAYS, DEPENDING ON CLINICAL CONDITION.  Note: This dictation was prepared with Dragon dictation along with smaller phrase technology. Any transcriptional errors that result from this process are unintentional.   Nicholes Mango M.D on 08/11/2017 at 3:06 PM  Between 7am to 6pm - Pager -  [phone] After 6pm go to [email or web address] - password [email or web address] [facility]  [facility]  Office  [phone]  CC: Primary care physician; Kirk Ruths, MD

## 2017-08-11 NOTE — Care Management Important Message (Signed)
Important Message  Patient Details  Name: TARAJAH MCKERNAN MRN: [id number] Date of Birth: [birth year]   Medicare Important Message Given:  Yes    Gwenette Greet, RN 08/11/2017, 11:29 AM

## 2017-08-12 ENCOUNTER — Inpatient Hospital Stay: Payer: Medicare Other

## 2017-08-12 LAB — PROCALCITONIN: Procalcitonin: <0.1 ng/mL

## 2017-08-12 LAB — BASIC METABOLIC PANEL
Anion gap: 10 (ref 5–15)
BUN: 37 mg/dL — AB (ref 6–20)
CALCIUM: 8.9 mg/dL (ref 8.9–10.3)
CO2: 28 mmol/L (ref 22–32)
CREATININE: 0.93 mg/dL (ref 0.44–1.00)
Chloride: 97 mmol/L — ABNORMAL LOW (ref 101–111)
GFR calc Af Amer: >60 mL/min (ref >60)
GFR, EST NON AFRICAN AMERICAN: 54 mL/min — AB (ref >60)
Glucose, Bld: 238 mg/dL — ABNORMAL HIGH (ref 65–99)
Potassium: 4 mmol/L (ref 3.5–5.1)
SODIUM: 135 mmol/L (ref 135–145)

## 2017-08-12 LAB — GLUCOSE, CAPILLARY
GLUCOSE-CAPILLARY: 219 mg/dL — AB (ref 65–99)
GLUCOSE-CAPILLARY: 227 mg/dL — AB (ref 65–99)
Glucose-Capillary: 267 mg/dL — ABNORMAL HIGH (ref 65–99)
Glucose-Capillary: 347 mg/dL — ABNORMAL HIGH (ref 65–99)

## 2017-08-12 MED ORDER — BENZONATATE 100 MG PO CAPS
200.0000 mg | ORAL_CAPSULE | Freq: Three times a day (TID) | ORAL | Status: DC
  Administered 2017-08-12 – 2017-08-14 (×5): 200 mg via ORAL
  Filled 2017-08-12 (×5): qty 2

## 2017-08-12 MED ORDER — ACETYLCYSTEINE 20 % IN SOLN
4.0000 mL | Freq: Two times a day (BID) | RESPIRATORY_TRACT | Status: DC
  Administered 2017-08-13 – 2017-08-14 (×3): 4 mL via RESPIRATORY_TRACT
  Filled 2017-08-12 (×4): qty 4

## 2017-08-12 MED ORDER — APIXABAN 5 MG PO TABS
5.0000 mg | ORAL_TABLET | Freq: Two times a day (BID) | ORAL | Status: DC
  Administered 2017-08-12 – 2017-08-14 (×4): 5 mg via ORAL
  Filled 2017-08-12 (×4): qty 1

## 2017-08-12 MED ORDER — SODIUM CHLORIDE 0.9 % IV SOLN
2.0000 g | Freq: Every day | INTRAVENOUS | Status: AC
  Administered 2017-08-12 – 2017-08-13 (×2): 2 g via INTRAVENOUS
  Filled 2017-08-12 (×2): qty 2

## 2017-08-12 MED ORDER — IOPAMIDOL (ISOVUE-300) INJECTION 61%
75.0000 mL | Freq: Once | INTRAVENOUS | Status: AC | PRN
  Administered 2017-08-12: 14:00:00 75 mL via INTRAVENOUS

## 2017-08-12 MED ORDER — GUAIFENESIN ER 600 MG PO TB12
600.0000 mg | ORAL_TABLET | Freq: Two times a day (BID) | ORAL | Status: DC
  Administered 2017-08-12 – 2017-08-14 (×5): 600 mg via ORAL
  Filled 2017-08-12 (×5): qty 1

## 2017-08-12 NOTE — Progress Notes (Signed)
Inpatient Diabetes Program Recommendations  AACE/ADA: New Consensus Statement on Inpatient Glycemic Control (2015)  Target Ranges:  Prepandial:   less than 140 mg/dL      Peak postprandial:   less than 180 mg/dL (1-2 hours)      Critically ill patients:  140 - 180 mg/dL   Results for Peggy [name], Peggy [name] (MRN [id number]) as of 08/12/2017 10:07  Ref. Range 08/11/2017 07:26 08/11/2017 11:47 08/11/2017 16:35 08/11/2017 21:12 08/12/2017 07:41  Glucose-Capillary Latest Ref Range: 65 - 99 mg/dL 295 (H) 188 (H) 416 (H) 269 (H) 219 (H)   Review of Glycemic Control  Diabetes history: DM Outpatient Diabetes medications: Humulin 70/30 18 units BID Current orders for Inpatient glycemic control: Lantus 20 units QHS, Novolog 0-15 units TID with meals; Novolog 0-5 units QHS, Novolog 3 units TID with meals for meal coverage;  Solumedrol 40 mg Q8H  Inpatient Diabetes Program Recommendations: Insulin - Basal: If steroids are continued as ordered, please consider increasing Lantus to 23 units QHS. Insulin - Meal Coverage:If steroids are continued as ordered, please consider increasing meal coverage to Novolog 6 units TID with meals for meal coverage.  Thanks, Orlando Penner, RN, MSN, CDE Diabetes Coordinator Inpatient Diabetes Program [phone] (Team Pager from 8am to 5pm)

## 2017-08-12 NOTE — Progress Notes (Signed)
Notified pt. spo2  Trending 80's. Encouraged pt to practice deep breathing. SpO2 increased to 96% on room air at 1635pm. Pt trending in 90% will continue to monitor.

## 2017-08-12 NOTE — Progress Notes (Signed)
[facility] [place] [facility]   PATIENT NAME: Peggy [name]    MR#:  [id number]  DATE OF BIRTH:  08-17-1928  SUBJECTIVE:   Pt still c/o sob and cough, states not feeling much better.    REVIEW OF SYSTEMS:  CONSTITUTIONAL: No fever, fatigue or weakness.  EYES: No blurred or double vision.  EARS, NOSE, AND THROAT: No tinnitus or ear pain.  RESPIRATORY: Reporting cough, improving shortness of breath, denies wheezing or hemoptysis.  CARDIOVASCULAR: No chest pain, orthopnea, edema.  GASTROINTESTINAL: No nausea, vomiting, diarrhea or abdominal pain.  GENITOURINARY: No dysuria, hematuria.  ENDOCRINE: No polyuria, nocturia,  HEMATOLOGY: No anemia, easy bruising or bleeding SKIN: No rash or lesion. MUSCULOSKELETAL: No joint pain or arthritis.   NEUROLOGIC: No tingling, numbness, weakness.  PSYCHIATRY: No anxiety or depression.   DRUG ALLERGIES:   Allergies  Allergen Reactions  . Diphenhydramine Rash    AGITATION/DELIRIUM  . Metformin Diarrhea  . Oxybutynin Other (See Comments)    dizzy  . Amlodipine Rash  . Ciprofloxacin Rash  . Lisinopril Rash  . Penicillins Rash    Family states was a "long time ago"  . Saxagliptin Rash    VITALS:  Blood pressure (!) 171/49, pulse (!) 54, temperature (!) 97.4 F (36.3 C), temperature source Oral, resp. rate (!) 22, height 5\' 2"  (1.575 m), weight 159 lb 9.6 oz (72.4 kg), SpO2 97 %.  PHYSICAL EXAMINATION:  GENERAL:  84 y.o.-year-old patient lying in the bed with no acute distress.  EYES: Pupils equal, round, reactive to light and accommodation. No scleral icterus. Extraocular muscles intact.  HEENT: Head atraumatic, normocephalic. Oropharynx and nasopharynx clear.  NECK:  Supple, no jugular venous distention. No thyroid enlargement, no tenderness.  LUNGS: decreased breath sound , minimal end expiratory wheezing, no rales,rhonchi or crepitation. No use of accessory muscles of respiration.  CARDIOVASCULAR: S1, S2  normal. No murmurs, rubs, or gallops.  ABDOMEN: Soft, nontender, nondistended. Bowel sounds present. No organomegaly or mass.  EXTREMITIES: No pedal edema, cyanosis, or clubbing.  NEUROLOGIC: Cranial nerves II through XII are intact. Muscle strength 5/5 in all extremities. Sensation intact. Gait not checked.  PSYCHIATRIC: The patient is alert and oriented x 3.  SKIN: No obvious rash, lesion, or ulcer.    LABORATORY PANEL:   CBC Recent Labs  Lab 08/10/17 1004  WBC 15.6*  HGB 12.0  HCT 37.2  PLT 319   ------------------------------------------------------------------------------------------------------------------  Chemistries  Recent Labs  Lab 08/12/17 1301  NA 135  K 4.0  CL 97*  CO2 28  GLUCOSE 238*  BUN 37*  CREATININE 0.93  CALCIUM 8.9   ------------------------------------------------------------------------------------------------------------------  Cardiac Enzymes Recent Labs  Lab 08/08/17 1710  TROPONINI 0.39*   ------------------------------------------------------------------------------------------------------------------  RADIOLOGY:  Ct Chest W Contrast  Result Date: 08/12/2017 CLINICAL DATA:  Follow-up left lower lobe infiltrate EXAM: CT CHEST WITH CONTRAST TECHNIQUE: Multidetector CT imaging of the chest was performed during intravenous contrast administration. CONTRAST:  86mL ISOVUE-300 IOPAMIDOL (ISOVUE-300) INJECTION 61% COMPARISON:  08/07/2017 FINDINGS: Cardiovascular: Thoracic aorta demonstrates atherosclerotic changes without aneurysmal dilatation or dissection. No cardiac enlargement is seen. Heavy coronary calcifications are noted. Pulmonary artery is well visualized without filling defect to suggest pulmonary embolism. Mediastinum/Nodes: Thoracic inlet is within normal limits. No significant hilar or mediastinal adenopathy is noted. The esophagus is unremarkable. Lungs/Pleura: Biapical scarring is noted and stable. Focal infiltrate is noted in the  lateral aspect of the right upper lobe inferiorly new from the prior exam. Complete consolidation in  the left lower lobe is noted as well. Previously seen left lower lobe nodule is obscured by the consolidation. No other nodules are seen. Mild emphysematous changes are noted. Upper Abdomen: Cyst is noted within the left lobe of the liver. No other focal abnormality is noted. Musculoskeletal: Degenerative changes are noted in the thoracic spine. No acute bony abnormality is noted. IMPRESSION: Complete consolidation of the left lower lobe new from the prior exam. Mild infiltrative changes in the lateral aspect of the right upper lobe inferiorly. Aortic Atherosclerosis (ICD10-I70.0) and Emphysema (ICD10-J43.9). Electronically Signed   By: Alcide Clever M.D.   On: 08/12/2017 13:54    EKG:   Orders placed or performed during the hospital encounter of 08/07/17  . ED EKG within 10 minutes  . ED EKG within 10 minutes  . EKG 12-Lead  . EKG 12-Lead  . EKG 12-Lead  . EKG 12-Lead    ASSESSMENT AND PLAN:    This is an 84 year old female admitted for respiratory failure.  #. Acute hypoxic respiratory failuremre consistent with COPD with  acute bronchitis, pneumonia some interstitial edema. I did a CT scan of the abdomen which shows pneumonia with dense consolidation in the left lower lobe Due to no significant improvement in current condition I have discussed the case with Dr. Welton Flakes who will see the patient  #. Diabetes mellitus type 2: Continue basal insulin therapy along with sliding scale insulin while hospitalized.   # AKI-from poor p.o. intake  Resolved  #. Atrial fibrillation: Paroxysmal; continue Eliquis. Also continue amiodarone Troponin 0 0.57-0.40- 0.39-0.38 patient is asymptomatic.  Seen by cardiology Dr. Lady Gary.  If patient clinically improves and troponin continues to decline recommending outpatient follow-up  #. Hypertension: Initially uncontrolled.  Blood pressure labile  #  Hypothyroidism: nml TSH; continue Synthroid  6. Hyperlipidemia: Continue statin therapy  7. DVT prophylaxis: Full dose anticoagulation as above  8. GI prophylaxis: None   Weakness PT assessment   All the records are reviewed and case discussed with Care Management/Social Workerr. Management plans discussed with the patient, family and they are in agreement.  CODE STATUS: fc   TOTAL TIME TAKING CARE OF THIS PATIENT: 35 minutes.   POSSIBLE D/C IN 1  DAYS, DEPENDING ON CLINICAL CONDITION.  Note: This dictation was prepared with Dragon dictation along with smaller phrase technology. Any transcriptional errors that result from this process are unintentional.   Auburn Bilberry M.D on 08/12/2017 at 2:09 PM  Between 7am to 6pm - Pager - [phone] After 6pm go to [email or web address] - password [email or web address] [facility]  [facility]  Office  [phone]  CC: Primary care physician; Lauro Regulus, MD

## 2017-08-12 NOTE — Progress Notes (Signed)
Pharmacy Antibiotic Note  Peggy [name] is a 84 y.o. female admitted on 08/07/2017 with pneumonia.  Pharmacy has been consulted for cefepime dosing.   Vancomycin discontinued  Plan: Renal fxn improved will change to Cefepime 2 g iv q 24 hours.  Per ID continue thru 08/13/17.    Height: 5\' 2"  (157.5 cm) Weight: 159 lb 9.6 oz (72.4 kg) IBW/kg (Calculated) : 50.1  Temp (24hrs), Avg:97.7 F (36.5 C), Min:97.4 F (36.3 C), Max:97.9 F (36.6 C)  Recent Labs  Lab 08/07/17 0017 08/10/17 1004  WBC 17.0* 15.6*  CREATININE 1.69* 1.10*    Estimated Creatinine Clearance: 34.2 mL/min (A) (by C-G formula based on SCr of 1.1 mg/dL (H)).    Allergies  Allergen Reactions  . Diphenhydramine Rash    AGITATION/DELIRIUM  . Metformin Diarrhea  . Oxybutynin Other (See Comments)    dizzy  . Amlodipine Rash  . Ciprofloxacin Rash  . Lisinopril Rash  . Penicillins Rash    Family states was a "long time ago"  . Saxagliptin Rash    Antimicrobials this admission: Azithromycin/CTX 3/8 x 1 cefuroxime 3/8 >> 3/10 Vancomycin 3/10 >> 3/11 Cefepime 3/10 >.  Dose adjustments this admission:   Microbiology results: MRSA PCR:neg  Thank you for allowing pharmacy to be a part of this patient's care.  Olliver Boyadjian A 08/12/2017 1:10 PM

## 2017-08-12 NOTE — Progress Notes (Signed)
Physical Therapy Treatment Patient Details Name: Peggy [name] MRN: [id number] DOB: 09-23-1928 Today's Date: 08/12/2017    History of Present Illness Pt is a 84 y.o. female with a known history of A. Fib, diabetes, hyperlipidemia, hypertension, TIA. Pt with recent admission in 07/2017 and now readmitted for acute respiratory failure with hypoxia. Troponins elevated but cardiology is following pt and pt cleared to work with therapy.     PT Comments    Pt agreeable to PT; reports no pain, but weakness and general malaise as well as persistent cough. Pt O2 saturation 97% initially on 1 L and 93% with activity. Pt able to demonstrate progression with ambulation/stair climbing with need for stand rest breaks. Pt safer with use of rolling walker, which is not baseline; pt does note she has a rollator at home. Pt educated on use of 2 hands/1 rail for improved safety on steps due to fatigue/weakness. Pt also educated on E conservation techniques to apply once home to manage lower endurance, O2 saturation levels, SOB symptoms/cough; pt understands. Pt does have daughter available to help at home.   Follow Up Recommendations  Home health PT     Equipment Recommendations       Recommendations for Other Services       Precautions / Restrictions Precautions Precautions: Fall Restrictions Weight Bearing Restrictions: No    Mobility  Bed Mobility Overal bed mobility: Needs Assistance Bed Mobility: Supine to Sit     Supine to sit: HOB elevated;Supervision     General bed mobility comments: Mild increased time  Transfers Overall transfer level: Needs assistance Equipment used: Rolling walker (2 wheeled) Transfers: Sit to/from Stand Sit to Stand: Min guard         General transfer comment: Safe use of hands, no LOB; mild fatigue/weak feeling  Ambulation/Gait Ambulation/Gait assistance: Min guard Ambulation Distance (Feet): 90 Feet(stand rest, additional 90 ft) Assistive device:  Rolling walker (2 wheeled) Gait Pattern/deviations: Step-through pattern;Decreased stride length     General Gait Details: Assist for O2. O2 saturation level 93% with ambulation on 1 L. Pt using rw, which is not baseline. Slow due to mild fatigue and mild SOB.   Stairs Stairs: Yes   Stair Management: One rail Left(education on use of 1 rail/2 hand for increased support/safe) Number of Stairs: 3    Wheelchair Mobility    Modified Rankin (Stroke Patients Only)       Balance Overall balance assessment: History of Falls;Needs assistance   Sitting balance-Leahy Scale: Good     Standing balance support: Bilateral upper extremity supported Standing balance-Leahy Scale: Fair                              Cognition Arousal/Alertness: Awake/alert Behavior During Therapy: WFL for tasks assessed/performed Overall Cognitive Status: Within Functional Limits for tasks assessed                                        Exercises General Exercises - Lower Extremity Ankle Circles/Pumps: Both;20 reps;AROM Quad Sets: Strengthening;Both;10 reps Gluteal Sets: Strengthening;Both;10 reps Long Arc Quad: AROM;Both;20 reps;Seated Hip Flexion/Marching: AROM;Both;20 reps Toe Raises: AROM;Both;20 reps;Seated Heel Raises: AROM;Both;20 reps;Seated    General Comments        Pertinent Vitals/Pain Pain Assessment: No/denies pain    Home Living  Prior Function            PT Goals (current goals can now be found in the care plan section) Progress towards PT goals: Progressing toward goals    Frequency    Min 2X/week      PT Plan Discharge plan needs to be updated    Co-evaluation              AM-PAC PT "6 Clicks" Daily Activity  Outcome Measure  Difficulty turning over in bed (including adjusting bedclothes, sheets and blankets)?: A Little Difficulty moving from lying on back to sitting on the side of the bed? : A  Little Difficulty sitting down on and standing up from a chair with arms (e.g., wheelchair, bedside commode, etc,.)?: Unable Help needed moving to and from a bed to chair (including a wheelchair)?: A Little Help needed walking in hospital room?: A Little Help needed climbing 3-5 steps with a railing? : A Little 6 Click Score: 16    End of Session Equipment Utilized During Treatment: Gait belt;Oxygen Activity Tolerance: Patient limited by fatigue;Other (comment)(cough) Patient left: in chair;with call bell/phone within reach;with chair alarm set;with family/visitor present   PT Visit Diagnosis: Unsteadiness on feet (R26.81);Other abnormalities of gait and mobility (R26.89);Muscle weakness (generalized) (M62.81);History of falling (Z91.81)     Time: 0973-5329 PT Time Calculation (min) (ACUTE ONLY): 39 min  Charges:  $Gait Training: 23-37 mins $Therapeutic Exercise: 8-22 mins                    G Codes:        Scot Dock, PTA 08/12/2017, 11:58 AM

## 2017-08-12 NOTE — Progress Notes (Signed)
[facility] CLINIC INFECTIOUS DISEASE PROGRESS NOTE Date of Admission:  08/07/2017     ID: Peggy [name] is a 84 y.o. female with  PNA Active Problems:   Acute respiratory failure with hypoxia and hypercarbia (HCC)   Respiratory failure (HCC)   Subjective: No fevers, still with cough and sob but off O2  ROS  Eleven systems are reviewed and negative except per hpi  Medications:  Antibiotics Given (last 72 hours)    Date/Time Action Medication Dose Rate   08/09/17 1648 New Bag/Given   ceFEPIme (MAXIPIME) 1 g in sodium chloride 0.9 % 100 mL IVPB 1 g 200 mL/hr   08/09/17 1736 New Bag/Given   vancomycin (VANCOCIN) IVPB 750 mg/150 ml premix 750 mg 150 mL/hr   08/10/17 1746 New Bag/Given   ceFEPIme (MAXIPIME) 1 g in sodium chloride 0.9 % 100 mL IVPB 1 g 200 mL/hr   08/11/17 1725 New Bag/Given   ceFEPIme (MAXIPIME) 1 g in sodium chloride 0.9 % 100 mL IVPB 1 g 200 mL/hr     . acetylcysteine  4 mL Nebulization BID  . acidophilus  2 capsule Oral Daily  . amiodarone  200 mg Oral QHS  . apixaban  5 mg Oral BID  . atorvastatin  40 mg Oral q1800  . benzonatate  200 mg Oral TID  . cholecalciferol  5,000 Units Oral Daily  . docusate sodium  100 mg Oral BID  . fenofibrate  160 mg Oral Daily  . fluticasone  1 spray Each Nare Daily  . guaiFENesin  600 mg Oral BID  . insulin aspart  0-15 Units Subcutaneous TID WC  . insulin aspart  0-5 Units Subcutaneous QHS  . insulin aspart  3 Units Subcutaneous TID WC  . insulin glargine  20 Units Subcutaneous QHS  . ipratropium-albuterol  3 mL Nebulization Q6H  . levothyroxine  50 mcg Oral Daily  . mouth rinse  15 mL Mouth Rinse BID  . methylPREDNISolone (SOLU-MEDROL) injection  40 mg Intravenous Q8H  . metoprolol tartrate  100 mg Oral BID  . vitamin B-12  1,000 mcg Oral Daily    Objective: Vital signs in last 24 hours: Temp:  [97.4 F (36.3 C)-97.7 F (36.5 C)] 97.4 F (36.3 C) (03/13 0416) Pulse Rate:  [54-73] 54 (03/13 1142) Resp:  [19-22] 22  (03/13 0416) BP: (144-171)/(49-75) 171/49 (03/13 0827) SpO2:  [90 %-100 %] 97 % (03/13 1142) Weight:  [72.4 kg (159 lb 9.6 oz)] 72.4 kg (159 lb 9.6 oz) (03/13 0416) Constitutional:  oriented to person, place, and time. Frail, HENT: /AT, PERRLA, no scleral icterus Mouth/Throat: Oropharynx is clear and moist. No oropharyngeal exudate.  Cardiovascular: Normal rate, regular rhythm and normal heart sounds Pulmonary/Chest: poor air movement Neck = supple, no nuchal rigidity Abdominal: Soft. Bowel sounds are normal.  exhibits no distension. There is no tenderness.  Lymphadenopathy: no cervical adenopathy. No axillary adenopathy Neurological: alert and oriented to person, place, and time.  Skin: Skin is warm and dry. No rash noted. No erythema.  Psychiatric: a normal mood and affect.  behavior is normal.   Lab Results Recent Labs    08/10/17 1004 08/12/17 1301  WBC 15.6*  --   HGB 12.0  --   HCT 37.2  --   NA 136 135  K 4.1 4.0  CL 102 97*  CO2 25 28  BUN 44* 37*  CREATININE 1.10* 0.93    Microbiology: Results for orders placed or performed during the hospital encounter of 08/07/17  MRSA PCR Screening     Status: None   Collection Time: 08/09/17  9:22 PM  Result Value Ref Range Status   MRSA by PCR NEGATIVE NEGATIVE Final    Comment:        The GeneXpert MRSA Assay (FDA approved for NASAL specimens only), is one component of a comprehensive MRSA colonization surveillance program. It is not intended to diagnose MRSA infection nor to guide or monitor treatment for MRSA infections. Performed at [facility], [address] [place], [place] [address]      Studies/Results: Ct Chest W Contrast  Result Date: 08/12/2017 CLINICAL DATA:  Follow-up left lower lobe infiltrate EXAM: CT CHEST WITH CONTRAST TECHNIQUE: Multidetector CT imaging of the chest was performed during intravenous contrast administration. CONTRAST:  75mL ISOVUE-300 IOPAMIDOL (ISOVUE-300) INJECTION  61% COMPARISON:  08/07/2017 FINDINGS: Cardiovascular: Thoracic aorta demonstrates atherosclerotic changes without aneurysmal dilatation or dissection. No cardiac enlargement is seen. Heavy coronary calcifications are noted. Pulmonary artery is well visualized without filling defect to suggest pulmonary embolism. Mediastinum/Nodes: Thoracic inlet is within normal limits. No significant hilar or mediastinal adenopathy is noted. The esophagus is unremarkable. Lungs/Pleura: Biapical scarring is noted and stable. Focal infiltrate is noted in the lateral aspect of the right upper lobe inferiorly new from the prior exam. Complete consolidation in the left lower lobe is noted as well. Previously seen left lower lobe nodule is obscured by the consolidation. No other nodules are seen. Mild emphysematous changes are noted. Upper Abdomen: Cyst is noted within the left lobe of the liver. No other focal abnormality is noted. Musculoskeletal: Degenerative changes are noted in the thoracic spine. No acute bony abnormality is noted. IMPRESSION: Complete consolidation of the left lower lobe new from the prior exam. Mild infiltrative changes in the lateral aspect of the right upper lobe inferiorly. Aortic Atherosclerosis (ICD10-I70.0) and Emphysema (ICD10-J43.9). Electronically Signed   By: Alcide Clever M.D.   On: 08/12/2017 13:54    Assessment/Plan: Peggy [name] is a 84 y.o. female admitted with increasing sob and cough with sputum. On admit had wbc 17 and temp 97. CT showed possible mucus plugs and mild infiltrate. Started on treatment for CAP with ceftriaxone and azithro. Flu PCR neg. MRSA PCR neg. Has continued to have dry cough and had a desat on 3/10 with hypoxia but reports doing much better today. Changed to cefepime 3/10. She has hx Afib, CAD, CVA, IDDM. Recent admission for a fib with RVR and dc 07/04/17. No sputum has been produced. She denies any episodes of aspiration.  3/13 - clinically improving but CT shows dense  consolidation LLL.  She is allergic to penicillins and cipro.   Recommendations Cont cefepime for HCAP. No Vanco since MRSA PCR neg.  If improving would plan on an 8 day course of treatment. Stop date 3/14. Would not be able to use levofloxacin since is on amiodarone so would continue inpatient until 3/1/4.   Thank you very much for the consult. Will follow with you.  Mick Sell   08/12/2017, 3:22 PM

## 2017-08-12 NOTE — Progress Notes (Signed)
Patient Name: Peggy [name] Date of Encounter: 08/12/2017  Hospital Problem List     Active Problems:   Acute respiratory failure with hypoxia and hypercarbia (HCC)   Respiratory failure [facility])    Patient Profile     Still sob with a cough.   Subjective   Cough and sob  Inpatient Medications    . acidophilus  2 capsule Oral Daily  . amiodarone  200 mg Oral QHS  . apixaban  2.5 mg Oral BID  . atorvastatin  40 mg Oral q1800  . cholecalciferol  5,000 Units Oral Daily  . docusate sodium  100 mg Oral BID  . fenofibrate  160 mg Oral Daily  . fluticasone  1 spray Each Nare Daily  . insulin aspart  0-15 Units Subcutaneous TID WC  . insulin aspart  0-5 Units Subcutaneous QHS  . insulin aspart  3 Units Subcutaneous TID WC  . insulin glargine  20 Units Subcutaneous QHS  . ipratropium-albuterol  3 mL Nebulization Q6H  . levothyroxine  50 mcg Oral Daily  . mouth rinse  15 mL Mouth Rinse BID  . methylPREDNISolone (SOLU-MEDROL) injection  40 mg Intravenous Q8H  . metoprolol tartrate  100 mg Oral BID  . vitamin B-12  1,000 mcg Oral Daily    Vital Signs    Vitals:   08/11/17 1410 08/11/17 1917 08/12/17 0201 08/12/17 0416  BP:  (!) 144/75  (!) 158/62  Pulse:  73  (!) 56  Resp:  19  (!) 22  Temp:  97.7 F (36.5 C)  (!) 97.4 F (36.3 C)  TempSrc:  Oral  Oral  SpO2: 93% 100% 99% 95%  Weight:    72.4 kg (159 lb 9.6 oz)  Height:        Intake/Output Summary (Last 24 hours) at 08/12/2017 0831 Last data filed at 08/12/2017 0444 Gross per 24 hour  Intake 720 ml  Output 1900 ml  Net -1180 ml   Filed Weights   08/10/17 0500 08/11/17 0344 08/12/17 0416  Weight: 73.2 kg (161 lb 6.4 oz) 72.5 kg (159 lb 14.4 oz) 72.4 kg (159 lb 9.6 oz)    Physical Exam    GEN: Well nourished, well developed, in no acute distress.  HEENT: normal.  Neck: Supple, no JVD, carotid bruits, or masses. Cardiac: RRR, no murmurs, rubs, or gallops. No clubbing, cyanosis, edema.  Radials/DP/PT 2+ and equal  bilaterally.  Respiratory:  Respirations regular and unlabored, clear to auscultation bilaterally. GI: Soft, nontender, nondistended, BS + x 4. MS: no deformity or atrophy. Skin: warm and dry, no rash. Neuro:  Strength and sensation are intact. Psych: Normal affect.  Labs    CBC Recent Labs    08/10/17 1004  WBC 15.6*  HGB 12.0  HCT 37.2  MCV 83.7  PLT 161   Basic Metabolic Panel Recent Labs    08/10/17 1004  NA 136  K 4.1  CL 102  CO2 25  GLUCOSE 361*  BUN 44*  CREATININE 1.10*  CALCIUM 8.8*   Liver Function Tests No results for input(s): AST, ALT, ALKPHOS, BILITOT, PROT, ALBUMIN in the last 72 hours. No results for input(s): LIPASE, AMYLASE in the last 72 hours. Cardiac Enzymes No results for input(s): CKTOTAL, CKMB, CKMBINDEX, TROPONINI in the last 72 hours. BNP No results for input(s): BNP in the last 72 hours. D-Dimer No results for input(s): DDIMER in the last 72 hours. Hemoglobin A1C No results for input(s): HGBA1C in the last 72 hours. Fasting  Lipid Panel No results for input(s): CHOL, HDL, LDLCALC, TRIG, CHOLHDL, LDLDIRECT in the last 72 hours. Thyroid Function Tests No results for input(s): TSH, T4TOTAL, T3FREE, THYROIDAB in the last 72 hours.  Invalid input(s): FREET3  Telemetry    nsr  ECG      Radiology    Dg Chest 2 View  Result Date: 08/09/2017 CLINICAL DATA:  Shortness of breath. EXAM: CHEST - 2 VIEW COMPARISON:  Chest CT and chest x-ray dated August 07, 2017. FINDINGS: Stable mild cardiomegaly and interstitial pulmonary edema. Increased consolidation in the left lower lobe. No pneumothorax or large pleural effusion. No acute osseous abnormality. IMPRESSION: 1. Increasing consolidation in the left lower lobe, concerning for pneumonia. 2. Unchanged mild cardiomegaly and pulmonary interstitial edema. Electronically Signed   By: Titus Dubin M.D.   On: 08/09/2017 13:53   Dg Chest 2 View  Result Date: 08/03/2017 CLINICAL DATA:  Patient  had onset of palpitations last night associated with shortness of breath. The patient reports a 4 day history of cough. History of hypertension, atrial fibrillation, coronary artery disease with previous MI, previous CVA. Former smoker. EXAM: CHEST  2 VIEW COMPARISON:  Portable chest x-ray of June 29, 2017 FINDINGS: The lungs are well-expanded. The interstitial markings are coarse. There is stable biapical pleural thickening. There is no alveolar infiltrate. The heart and pulmonary vascularity are normal. There is calcification in the wall of the aortic arch and descending thoracic aorta. The bony thorax is unremarkable. IMPRESSION: Chronic bronchitic changes, stable.  No acute pneumonia nor CHF. Thoracic aortic atherosclerosis. Electronically Signed   By: David  Martinique M.D.   On: 08/03/2017 17:00   Ct Chest Wo Contrast  Result Date: 08/07/2017 CLINICAL DATA:  Chest pain or shortness of breath. Respiratory distress, progressive. EXAM: CT CHEST WITHOUT CONTRAST TECHNIQUE: Multidetector CT imaging of the chest was performed following the standard protocol without IV contrast. COMPARISON:  Radiographs earlier this day as well as 08/03/2017. Chest CT 03/06/2017 FINDINGS: Cardiovascular: Atherosclerosis of the thoracic aorta. No aneurysm. No periaortic stranding. There are coronary artery calcifications/stents. No pericardial effusion. Mediastinum/Nodes: Small mediastinal nodes largest measuring 9 mm short axis. Limited assessment for hilar adenopathy given lack of IV contrast. No bulky hilar adenopathy. The esophagus is decompressed. Lungs/Pleura: Mild emphysema. Central bronchial thickening with mucus/debris in the left mainstem and lower lobe bronchus. Short-segment mucous plugging/bronchial occlusion in the left lower lobe. Patchy ground-glass left lower lobe opacity, partially obscured by breathing motion. There is mild smooth septal thickening suggesting pulmonary edema. Biapical pleuroparenchymal scarring.  There are multiple small pulmonary nodules, some of which are calcified. The largest measures 8 mm in the superior segments of the left lower lobe image 35 series 4. This is unchanged dating back to 2015 and considered benign. No new pulmonary nodule or mass. No pleural effusion. Upper Abdomen: Simple cyst in the left lobe of the liver. No acute finding. Musculoskeletal: There are no acute or suspicious osseous abnormalities. IMPRESSION: 1. Mild emphysema with bronchial thickening and scattered debris/mucus in the left lower lobe bronchus. Areas of mucous plugging/bronchial occlusion in the left lower lobe. Patchy left lower lobe ground-glass opacities may be postobstructive atelectasis or pneumonia. 2. Smooth septal thickening consistent with pulmonary edema. 3. Benign pulmonary nodules, stable dating back to 2015 CT. Biapical pleuroparenchymal scarring is unchanged. 4. No pleural effusion. Aortic Atherosclerosis (ICD10-I70.0) and Emphysema (ICD10-J43.9). Electronically Signed   By: Jeb Levering M.D.   On: 08/07/2017 02:47   Dg Chest [facility] 1 View  Result  Date: 08/07/2017 CLINICAL DATA:  Initial evaluation for acute respiratory distress. EXAM: PORTABLE CHEST 1 VIEW COMPARISON:  Prior radiograph from 08/03/2017. FINDINGS: Mild cardiomegaly, stable.  Mediastinal silhouette normal. Lungs normally inflated. Underlying COPD. There is subtly increased prominence of the interstitial markings as compared to previous, suggesting pulmonary interstitial edema. No consolidative airspace disease. No pleural effusion. No pneumothorax. No acute osseous abnormality. IMPRESSION: 1. Diffuse prominence of the interstitial markings, consistent with mild diffuse pulmonary interstitial edema. 2. Underlying chronic bronchitic changes/COPD. Electronically Signed   By: Jeannine Boga M.D.   On: 08/07/2017 00:56    Assessment & Plan    afib-currently in nsr. Continue with amiodarone at 200 mg daily and apixaban. Dose is  currently at 2.5. Renal function has improved. Will need to increase apixaban to 5 mg bid as ,although age greater than 80, creatinine is less than 1.5 and weight greater than 60 kg. WIll repeat met b today to confirm renal function.   CKD-creatinine improved to 1.10. Will recheck today.    Javier Docker Antwann Preziosi MD 08/12/2017, 8:31 AM  Pager: [phone]

## 2017-08-13 LAB — GLUCOSE, CAPILLARY
GLUCOSE-CAPILLARY: 191 mg/dL — AB (ref 65–99)
GLUCOSE-CAPILLARY: 311 mg/dL — AB (ref 65–99)
GLUCOSE-CAPILLARY: 312 mg/dL — AB (ref 65–99)
GLUCOSE-CAPILLARY: 356 mg/dL — AB (ref 65–99)

## 2017-08-13 LAB — EXPECTORATED SPUTUM ASSESSMENT W GRAM STAIN, RFLX TO RESP C

## 2017-08-13 LAB — PROCALCITONIN: Procalcitonin: <0.1 ng/mL

## 2017-08-13 LAB — EXPECTORATED SPUTUM ASSESSMENT W REFEX TO RESP CULTURE: SPECIAL REQUESTS: NORMAL

## 2017-08-13 MED ORDER — PREDNISONE 10 MG PO TABS: 5.0000 mg | ORAL_TABLET | Freq: Every day | ORAL | Status: DC

## 2017-08-13 MED ORDER — PREDNISONE 10 MG PO TABS: 10.0000 mg | ORAL_TABLET | Freq: Every day | ORAL | Status: DC

## 2017-08-13 MED ORDER — PREDNISONE 50 MG PO TABS
50.0000 mg | ORAL_TABLET | Freq: Every day | ORAL | Status: AC
  Administered 2017-08-14: 50 mg via ORAL
  Filled 2017-08-13: qty 1

## 2017-08-13 MED ORDER — PREDNISONE 20 MG PO TABS: 20.0000 mg | ORAL_TABLET | Freq: Every day | ORAL | Status: DC

## 2017-08-13 MED ORDER — PREDNISONE 20 MG PO TABS: 30.0000 mg | ORAL_TABLET | Freq: Every day | ORAL | Status: DC

## 2017-08-13 MED ORDER — PREDNISONE 20 MG PO TABS: 40.0000 mg | ORAL_TABLET | Freq: Every day | ORAL | Status: DC

## 2017-08-13 MED ORDER — INSULIN ISOPHANE & REGULAR (HUMAN 70-30)100 UNIT/ML KWIKPEN: 18.0000 [IU] | PEN_INJECTOR | Freq: Two times a day (BID) | SUBCUTANEOUS | Status: DC

## 2017-08-13 MED ORDER — INSULIN GLARGINE 100 UNIT/ML ~~LOC~~ SOLN
25.0000 [IU] | Freq: Every day | SUBCUTANEOUS | Status: DC
  Administered 2017-08-13: 23:00:00 25 [IU] via SUBCUTANEOUS
  Filled 2017-08-13 (×2): qty 0.25

## 2017-08-13 NOTE — Clinical Social Work Note (Signed)
CSW received referral for SNF.  Case discussed with case manager and plan is to discharge home with home health.  CSW to sign off please re-consult if social work needs arise.  Tylek Boney R. Cailen Texeira, MSW, LCSWA [phone]  

## 2017-08-13 NOTE — Care Management Note (Addendum)
Case Management Note  Patient Details  Name: Peggy [name] MRN: [id number] Date of Birth: [birth year]  Subjective/Objective:  Admitted to [facility] with the diagnosis of acute respiratory failure. Lives with daughter, Peggy [name] [phone]). Last was in Dr. Ewell Poe office 2 weeks ago. Prescriptions are filled at [facility] in [place]. [facility] about a year ago. Doesn't remember name of agency. [facility] 8 years ago. No home oxygen. Rolling walker, cane, shower chair, wheelchair and Life Alert in the home. Takes care of all basic activities of daily living herself, drives. Last fall was 3-4 months ago. Good appetite. Daughter will transport                Action/Plan: physical therapy evaluation recommending home with home health/physical therapy. Would like to have same agency she has had in the past. Per Barbara Cower from [facility], they have had [name] in the past May need home oxygen   Expected Discharge Date:  08/08/17               Expected Discharge Plan:     In-House Referral:   yes  Discharge planning Services   yes  Post Acute Care Choice:    Choice offered to:     DME Arranged:    DME Agency:     HH Arranged:   yes HH Agency:   [facility]  Status of Service:     If discussed at Long Length of Stay Meetings, dates discussed:    Additional Comments:  Gwenette Greet, RN MSN CCM Care Management [phone] 08/13/2017, 8:26 AM

## 2017-08-13 NOTE — Progress Notes (Signed)
Inpatient Diabetes Program Recommendations  AACE/ADA: New Consensus Statement on Inpatient Glycemic Control (2015)  Target Ranges:  Prepandial:   less than 140 mg/dL      Peak postprandial:   less than 180 mg/dL (1-2 hours)      Critically ill patients:  140 - 180 mg/dL   Results for SAILOR, KALLENBERGER (MRN [id number]) as of 08/13/2017 10:21  Ref. Range 08/12/2017 07:41 08/12/2017 11:47 08/12/2017 16:43 08/12/2017 21:00 08/13/2017 07:38  Glucose-Capillary Latest Ref Range: 65 - 99 mg/dL 543 (H)  Novolog 8 units 227 (H)  Novolog 8 units 267 (H)  Novolog 14 units 347 (H)  Novolog 4 units  Lantus 20 units 191 (H)  Novolog 6 units   Review of Glycemic Control  Diabetes history:DM Outpatient Diabetes medications:Humulin 70/30 18 units BID Current orders for Inpatient glycemic control:Lantus 20 units QHS, Novolog 0-15 units TID with meals; Novolog 0-5 units QHS, Novolog 3 units TID with meals for meal coverage;  Solumedrol 40 mg Q8H  Inpatient Diabetes Program Recommendations: Insulin - Basal:If steroids are continued as ordered, please consider increasing Lantus to 23 units QHS. Insulin - Meal Coverage:If steroids are continued as ordered, please consider increasing meal coverage to Novolog 6 units TID with meals for meal coverage  Thanks, Orlando Penner, RN, MSN, CDE Diabetes Coordinator Inpatient Diabetes Program [phone] (Team Pager from 8am to 5pm)

## 2017-08-13 NOTE — Progress Notes (Addendum)
[facility] [place] [facility]   PATIENT NAME: Peggy [name]    MR#:  [id number]  DATE OF BIRTH:  20-May-1929  SUBJECTIVE:   Still has cough but states it better, no cp or sob   REVIEW OF SYSTEMS:  CONSTITUTIONAL: No fever, fatigue or weakness.  EYES: No blurred or double vision.  EARS, NOSE, AND THROAT: No tinnitus or ear pain.  RESPIRATORY: Reporting cough, improving shortness of breath, denies wheezing or hemoptysis.  CARDIOVASCULAR: No chest pain, orthopnea, edema.  GASTROINTESTINAL: No nausea, vomiting, diarrhea or abdominal pain.  GENITOURINARY: No dysuria, hematuria.  ENDOCRINE: No polyuria, nocturia,  HEMATOLOGY: No anemia, easy bruising or bleeding SKIN: No rash or lesion. MUSCULOSKELETAL: No joint pain or arthritis.   NEUROLOGIC: No tingling, numbness, weakness.  PSYCHIATRY: No anxiety or depression.   DRUG ALLERGIES:   Allergies  Allergen Reactions  . Diphenhydramine Rash    AGITATION/DELIRIUM  . Metformin Diarrhea  . Oxybutynin Other (See Comments)    dizzy  . Amlodipine Rash  . Ciprofloxacin Rash  . Lisinopril Rash  . Penicillins Rash    Family states was a "long time ago"  . Saxagliptin Rash    VITALS:  Blood pressure (!) 168/52, pulse (!) 54, temperature (!) 97.5 F (36.4 C), temperature source Oral, resp. rate 18, height 5\' 2"  (1.575 m), weight 154 lb 4.8 oz (70 kg), SpO2 (!) 88 %.  PHYSICAL EXAMINATION:  GENERAL:  84 y.o.-year-old patient lying in the bed with no acute distress.  EYES: Pupils equal, round, reactive to light and accommodation. No scleral icterus. Extraocular muscles intact.  HEENT: Head atraumatic, normocephalic. Oropharynx and nasopharynx clear.  NECK:  Supple, no jugular venous distention. No thyroid enlargement, no tenderness.  LUNGS: decreased breath sound , minimal end expiratory wheezing, no rales,rhonchi or crepitation. No use of accessory muscles of respiration.  CARDIOVASCULAR: S1, S2 normal. No  [id number] E3.08st Laner gallops.  ABDOMEN: Soft, nontender, nondistended. Bowel sounds present. No o44 Thoracic aorta demonstrates atherosclerotic changes without aneurysmal dilatation or dissection. No cardiac enlargement is seen. Heavy coronary calcifications are noted. Pulmonary artery is well visualized without filling defect to suggest pulmonary embolism. Mediastinum/Nodes: Thoracic inlet is within normal limits. No significant hilar or mediastinal adenopathy is noted. The esophagus is unremarkable. Lungs/Pleura: Biapical scarring is noted and stable. Focal infiltrate is noted in the lateral  aspect of the right upper lobe inferiorly new from the prior exam. Complete consolidation in the  left lower lobe is noted as well. Previously seen left lower lobe nodule is obscured by the consolidation. No other nodules are seen. Mild emphysematous changes are noted. Upper Abdomen: Cyst is noted within the left lobe of the liver. No other focal abnormality is noted. Musculoskeletal: Degenerative changes are noted in the thoracic spine. No acute bony abnormality is noted. IMPRESSION: Complete consolidation of the left lower lobe new from the prior exam. Mild infiltrative changes in the lateral aspect of the right upper lobe inferiorly. Aortic Atherosclerosis (ICD10-I70.0) and Emphysema (ICD10-J43.9). Electronically Signed   By: Alcide Clever M.D.   On: 08/12/2017 13:54    EKG:   Orders placed or performed during the hospital encounter of 08/07/17  . ED EKG within 10 minutes  . ED EKG within 10 minutes  . EKG 12-Lead  . EKG 12-Lead  . EKG 12-Lead  . EKG 12-Lead    ASSESSMENT AND PLAN:    This is an 84 year old female admitted for respiratory failure.  #. Acute hypoxic respiratory failuremre consistent with COPD with  acute bronchitis, pneumonia some interstitial edema. I did a CT scan of the chest which shows pneumonia with dense consolidation in the left lower lobe Plan for bronch tomm Antibiotics finish today  #. Diabetes mellitus type 2: Continue basal insulin therapy along with sliding scale insulin while hospitalized. bg steroids, we will start tapering steroids patient will be on oral steroids, increase Lantus  # AKI-from poor p.o. intake  Resolved  #. Atrial fibrillation: Paroxysmal; continue Eliquis. Also continue amiodarone Troponin 0 0.57-0.40- 0.39-0.38 patient is asymptomatic.  Seen by cardiology Dr. Lady Gary.  If patient clinically improves and troponin continues to decline recommending outpatient follow-up  #. Hypertension: Initially uncontrolled.  Blood pressure  labile  # Hypothyroidism: nml TSH; continue Synthroid  6. Hyperlipidemia: Continue statin therapy  7. DVT prophylaxis: Full dose anticoagulation as above  8. GI prophylaxis: None   Weakness PT assessment   All the records are reviewed and case discussed with Care Management/Social Workerr. Management plans discussed with the patient, family and they are in agreement.  CODE STATUS: fc   TOTAL TIME TAKING CARE OF THIS PATIENT: 35 minutes.   POSSIBLE D/C IN 1  DAYS, DEPENDING ON CLINICAL CONDITION.  Note: This dictation was prepared with Dragon dictation along with smaller phrase technology. Any transcriptional errors that result from this process are unintentional.   Auburn Bilberry M.D on 08/13/2017 at 1:02 PM  Between 7am to 6pm - Pager - [phone] After 6pm go to [email or web address] - password [email or web address] [facility]  [facility]  Office  [phone]  CC: Primary care physician; Lauro Regulus, MD

## 2017-08-13 NOTE — Progress Notes (Addendum)
Increased to 2l after neb treatment due to sat of 90-91% on 1l.

## 2017-08-13 NOTE — Progress Notes (Signed)
Occupational Therapy Treatment Patient Details Name: Peggy [name] MRN: [id number] DOB: September 30, 1928 Today's Date: 08/13/2017    History of present illness Pt is a 84 y.o. female with a known history of A. Fib, diabetes, hyperlipidemia, hypertension, TIA. Pt with recent admission in 07/2017 and now readmitted for acute respiratory failure with hypoxia. Troponins elevated but cardiology is following pt and pt cleared to work with therapy.    OT comments  Pt seen for OT tx this date focused on performing ADL tasks while implementing energy conservation strategies to support breathing and minimize risk of over exertion. Pt now on room air. Pt reports feeling better than previous date. O2 sats on RA at rest 91-92%. Pt performed bed mobility with supervision and stood at the sink with supervision to perform grooming task. Verbal cue required for pursed lip breathing. Once seated after <61min of standing, O2 sats decreased to 86%, improving quickly with use of PLB up to 93-94%. Pt performed seated sponge bath with set up and occasional verbal cues to use PLB and to pace herself to minimize SOB. O2 sats 88-89% improving to 92-94% with verbal cues to use PLB. Pt/dtr provided with review of ESC previously taught to support recall and carryover. Pt continues to require verbal cues to pace herself and perform PLB during mobility and ADL activities. Recommended pt consider purchasing a pulse oximeter to self monitor HR and O2 sats at home. Pt continues to benefit from skilled OT services. She is progressing toward goals (was on 4L O2, now on RA), however still not at her baseline independence due to poor activity tolerance. Will continue to progress.    Follow Up Recommendations  Home health OT;Supervision - Intermittent    Equipment Recommendations  Other (comment)(HH showerhead w/ on/off switch)    Recommendations for Other Services      Precautions / Restrictions Precautions Precautions:  Fall Restrictions Weight Bearing Restrictions: No       Mobility Bed Mobility Overal bed mobility: Needs Assistance Bed Mobility: Supine to Sit;Sit to Supine     Supine to sit: HOB elevated;Supervision Sit to supine: Supervision   General bed mobility comments: Mild increased time  Transfers Overall transfer level: Needs assistance Equipment used: Rolling walker (2 wheeled);None Transfers: Sit to/from Stand Sit to Stand: Min guard;Supervision         General transfer comment: no LOB, safe use of hands on bed rail, mild increased breathing, VC to use PLB    Balance Overall balance assessment: History of Falls;Needs assistance Sitting-balance support: Feet supported;No upper extremity supported Sitting balance-Leahy Scale: Good     Standing balance support: Bilateral upper extremity supported Standing balance-Leahy Scale: Fair                             ADL either performed or assessed with clinical judgement   ADL Overall ADL's : Needs assistance/impaired Eating/Feeding: Sitting;Independent   Grooming: Standing;Supervision/safety;Brushing hair Grooming Details (indicate cue type and reason): VC for PLB, as O2 sats on RA decrease to 86% with <2 minutes of standing to comb hair, O2 sats improving quickly to 93-94% on RA once seated EOB when utilizing PLB Upper Body Bathing: Sitting;Supervision/ safety Upper Body Bathing Details (indicate cue type and reason): VC for PLB and pacing herself to perform UB bathing, O2 sats on RA 89-92% Lower Body Bathing: Sit to/from stand;Supervison/ safety Lower Body Bathing Details (indicate cue type and reason): VC for PLB and pacing herself to  perform LB bathing, O2 sats on RA 89-92%                             Vision Baseline Vision/History: Wears glasses Wears Glasses: Reading only Patient Visual Report: No change from baseline     Perception     Praxis      Cognition Arousal/Alertness:  Awake/alert Behavior During Therapy: WFL for tasks assessed/performed Overall Cognitive Status: Within Functional Limits for tasks assessed                                          Exercises Other Exercises Other Exercises: Pt/family provided with review of ESC previously taught to support recall and carryover. Pt continues to require verbal cues to pace herself and perform PLB during mobility and ADL activities.    Shoulder Instructions       General Comments      Pertinent Vitals/ Pain       Pain Assessment: No/denies pain  Home Living                                          Prior Functioning/Environment              Frequency  Min 2X/week        Progress Toward Goals  OT Goals(current goals can now be found in the care plan section)  Progress towards OT goals: Progressing toward goals  Acute Rehab OT Goals Patient Stated Goal: get back to PLOF OT Goal Formulation: With patient/family Time For Goal Achievement: 08/25/17 Potential to Achieve Goals: Good  Plan Discharge plan remains appropriate;Frequency remains appropriate    Co-evaluation                 AM-PAC PT "6 Clicks" Daily Activity     Outcome Measure   Help from another person eating meals?: None Help from another person taking care of personal grooming?: A Little Help from another person toileting, which includes using toliet, bedpan, or urinal?: A Little Help from another person bathing (including washing, rinsing, drying)?: A Little Help from another person to put on and taking off regular upper body clothing?: None Help from another person to put on and taking off regular lower body clothing?: A Little 6 Click Score: 20    End of Session    OT Visit Diagnosis: Other abnormalities of gait and mobility (R26.89);Muscle weakness (generalized) (M62.81);History of falling (Z91.81)   Activity Tolerance Patient tolerated treatment well   Patient Left  in bed;with call bell/phone within reach;with bed alarm set;with family/visitor present   Nurse Communication          Time: 1340-1410 OT Time Calculation (min): 30 min  Charges: OT General Charges $OT Visit: 1 Visit OT Treatments $Self Care/Home Management : 23-37 mins  Richrd Prime, MPH, MS, OTR/L ascom [phone] 08/13/17, 2:25 PM

## 2017-08-14 LAB — GLUCOSE, CAPILLARY: Glucose-Capillary: 77 mg/dL (ref 65–99)

## 2017-08-14 MED ORDER — IPRATROPIUM-ALBUTEROL 0.5-2.5 (3) MG/3ML IN SOLN: 3.0000 mL | Freq: Four times a day (QID) | RESPIRATORY_TRACT | 2 refills | Status: DC | PRN

## 2017-08-14 MED ORDER — BENZONATATE 200 MG PO CAPS: 200.0000 mg | ORAL_CAPSULE | Freq: Three times a day (TID) | ORAL | 0 refills | Status: DC

## 2017-08-14 MED ORDER — GUAIFENESIN-CODEINE 100-10 MG/5ML PO SOLN: 10.0000 mL | Freq: Four times a day (QID) | ORAL | 0 refills | Status: DC | PRN

## 2017-08-14 MED ORDER — PREDNISONE 10 MG (21) PO TBPK: ORAL_TABLET | ORAL | 0 refills | Status: DC

## 2017-08-14 MED ORDER — GUAIFENESIN ER 600 MG PO TB12: 600.0000 mg | ORAL_TABLET | Freq: Two times a day (BID) | ORAL | 0 refills | Status: AC

## 2017-08-14 NOTE — Care Management (Signed)
Discharge to home today per Dr. Allena Katz. Will be followed by [facility] in the home. Feliberto Gottron, [facility] representative updated. Daughter will transport Gwenette Greet RN MSN CCM Care Management [phone]

## 2017-08-14 NOTE — Discharge Summary (Signed)
[facility] [name], 84 y.o., DOB [birth year], MRN [id number]. Admission date: 08/07/2017 Discharge Date 08/14/2017 Primary MD Lauro Regulus, MD Admitting Physician Bertrum Sol, MD  Admission Diagnosis  Chest pain [R07.9] Acute respiratory failure with hypoxia and hypercarbia (HCC) [J96.01, J96.02] Chronic obstructive pulmonary disease, unspecified COPD type (HCC) [J44.9] Community acquired pneumonia of left lower lobe of lung (HCC) [J18.1] Respiratory failure (HCC) [J96.90]  Discharge Diagnosis    Acute hypoxic respiratory failure Acute COPD exasperation Community-acquired pneumonia Diabetes type 2 Acute kidney injury Essential hypertension Hypothyroid Hyperlipidemia      Hospital Course  Peggy [name] is a 84 y.o. female with a history of chronic atrial fibrillation recently was admitted and discharged in February presented to the hospital with increased SOB, patient noted to have COPD exasperation as well as pneumonia.  She was treated with nebulizer steroids and antibiotics.  Her symptoms were slow to improve she had a CT scan which showed a left lower lobe consolidation.  She was seen by pulmonary plan was for her to have a bronchoscopy howeve patient on Eliquis.  Therefore bronchoscopy was not performed due to patient doing much better.  She will follow-up with pulmonary.  Patient doing much better                Consults  pulmonary/intensive care  Significant Tests:  See full reports for all details    Dg Chest 2 View  Result Date: 08/09/2017 CLINICAL DATA:  Shortness of breath. EXAM: CHEST - 2 VIEW COMPARISON:  Chest CT and chest x-ray dated August 07, 2017. FINDINGS: Stable mild cardiomegaly and interstitial pulmonary edema. Increased consolidation in the left lower lobe. No pneumothorax or large pleural effusion. No acute osseous abnormality. IMPRESSION: 1. Increasing consolidation in the left lower lobe,  concerning for pneumonia. 2. Unchanged mild cardiomegaly and pulmonary interstitial edema. Electronically Signed   By: Obie Dredge M.D.   On: 08/09/2017 13:53   Dg Chest 2 View  Result Date: 08/03/2017 CLINICAL DATA:  Patient had onset of palpitations last night associated with shortness of breath. The patient reports a 4 day history of cough. History of hypertension, atrial fibrillation, coronary artery disease with previous MI, previous CVA. Former smoker. EXAM: CHEST  2 VIEW COMPARISON:  Portable chest x-ray of June 29, 2017 FINDINGS: The lungs are well-expanded. The interstitial markings are coarse. There is stable biapical pleural thickening. There is no alveolar infiltrate. The heart and pulmonary vascularity are normal. There is calcification in the wall of the aortic arch and descending thoracic aorta. The bony thorax is unremarkable. IMPRESSION: Chronic bronchitic changes, stable.  No acute pneumonia nor CHF. Thoracic aortic atherosclerosis. Electronically Signed   By: David  [place] M.D.   On: 08/03/2017 17:00   Ct Chest Wo Contrast  Result Date: 08/07/2017 CLINICAL DATA:  Chest pain or shortness of breath. Respiratory distress, progressive. EXAM: CT CHEST WITHOUT CONTRAST TECHNIQUE: Multidetector CT imaging of the chest was performed following the standard protocol without IV contrast. COMPARISON:  Radiographs earlier this day as well as 08/03/2017. Chest CT 03/06/2017 FINDINGS: Cardiovascular: Atherosclerosis of the thoracic aorta. No aneurysm. No periaortic stranding. There are coronary artery calcifications/stents. No pericardial effusion. Mediastinum/Nodes: Small mediastinal nodes largest measuring 9 mm short axis. Limited assessment for hilar adenopathy given lack of IV contrast. No bulky hilar adenopathy. The esophagus is decompressed. Lungs/Pleura: Mild emphysema. Central bronchial thickening with mucus/debris in the left mainstem and lower lobe bronchus. Short-segment mucous  plugging/bronchial occlusion in the left lower lobe. Patchy ground-glass left lower lobe opacity, partially obscured by breathing motion. There is mild smooth septal thickening suggesting pulmonary edema. Biapical pleuroparenchymal scarring. There are multiple small pulmonary nodules, some of which are calcified. The largest measures 8 mm in the superior segments of the left lower lobe image 35 series 4. This is unchanged dating back to 2015 and considered benign. No new pulmonary nodule or mass. No pleural effusion. Upper Abdomen: Simple cyst in the left lobe of the liver. No acute finding. Musculoskeletal: There are no acute or suspicious osseous abnormalities. IMPRESSION: 1. Mild emphysema with bronchial thickening and scattered debris/mucus in the left lower lobe bronchus. Areas of mucous plugging/bronchial occlusion in the left lower lobe. Patchy left lower lobe ground-glass opacities may be postobstructive atelectasis or pneumonia. 2. Smooth septal thickening consistent with pulmonary edema. 3. Benign pulmonary nodules, stable dating back to 2015 CT. Biapical pleuroparenchymal scarring is unchanged. 4. No pleural effusion. Aortic Atherosclerosis (ICD10-I70.0) and Emphysema (ICD10-J43.9). Electronically Signed   By: Rubye Oaks M.D.   On: 08/07/2017 02:47   Ct Chest W Contrast  Result Date: 08/12/2017 CLINICAL DATA:  Follow-up left lower lobe infiltrate EXAM: CT CHEST WITH CONTRAST TECHNIQUE: Multidetector CT imaging of the chest was performed during intravenous contrast administration. CONTRAST:  75mL ISOVUE-300 IOPAMIDOL (ISOVUE-300) INJECTION 61% COMPARISON:  08/07/2017 FINDINGS: Cardiovascular: Thoracic aorta demonstrates atherosclerotic changes without aneurysmal dilatation or dissection. No cardiac enlargement is seen. Heavy coronary calcifications are noted. Pulmonary artery is well visualized without filling defect to suggest pulmonary embolism. Mediastinum/Nodes: Thoracic inlet is within  normal limits. No significant hilar or mediastinal adenopathy is noted. The esophagus is unremarkable. Lungs/Pleura: Biapical scarring is noted and stable. Focal infiltrate is noted in the lateral aspect of the right upper lobe inferiorly new from the prior exam. Complete consolidation in the left lower lobe is noted as well. Previously seen left lower lobe nodule is obscured by the consolidation. No other nodules are seen. Mild emphysematous changes are noted. Upper Abdomen: Cyst is noted within the left lobe of the liver. No other focal abnormality is noted. Musculoskeletal: Degenerative changes are noted in the thoracic spine. No acute bony abnormality is noted. IMPRESSION: Complete consolidation of the left lower lobe new from the prior exam. Mild infiltrative changes in the lateral aspect of the right upper lobe inferiorly. Aortic Atherosclerosis (ICD10-I70.0) and Emphysema (ICD10-J43.9). Electronically Signed   By: Alcide Clever M.D.   On: 08/12/2017 13:54   Dg Chest Port 1 View  Result Date: 08/07/2017 CLINICAL DATA:  Initial evaluation for acute respiratory distress. EXAM: PORTABLE CHEST 1 VIEW COMPARISON:  Prior radiograph from 08/03/2017. FINDINGS: Mild cardiomegaly, stable.  Mediastinal silhouette normal. Lungs normally inflated. Underlying COPD. There is subtly increased prominence of the interstitial markings as compared to previous, suggesting pulmonary interstitial edema. No consolidative airspace disease. No pleural effusion. No pneumothorax. No acute osseous abnormality. IMPRESSION: 1. Diffuse prominence of the interstitial markings, consistent with mild diffuse pulmonary interstitial edema. 2. Underlying chronic bronchitic changes/COPD. Electronically Signed   By: Rise Mu M.D.   On: 08/07/2017 00:56       Today   Subjective:   Derwood Kaplan  Pt doing much better, breathing improved  Objective:   Blood pressure (!) 136/44, pulse 62, temperature 97.6 F (36.4 C), temperature  source Oral, resp. rate 16, height 5\' 2"  (1.575 m), weight 153 lb 4.8 oz (69.5 kg), SpO2 96 %.  .  Intake/Output Summary (Last 24 hours) at 08/14/2017  1350 Last data filed at 08/14/2017 1029 Gross per 24 hour  Intake 480 ml  Output 1600 ml  Net -1120 ml    Exam VITAL SIGNS: Blood pressure (!) 136/44, pulse 62, temperature 97.6 F (36.4 C), temperature source Oral, resp. rate 16, height 5\' 2"  (1.575 m), weight 153 lb 4.8 oz (69.5 kg), SpO2 96 %.  GENERAL:  84 y.o.-year-old patient lying in the bed with no acute distress.  EYES: Pupils equal, round, reactive to light and accommodation. No scleral icterus. Extraocular muscles intact.  HEENT: Head atraumatic, normocephalic. Oropharynx and nasopharynx clear.  NECK:  Supple, no jugular venous distention. No thyroid enlargement, no tenderness.  LUNGS: Normal breath sounds bilaterally, no wheezing, rales,rhonchi or crepitation. No use of accessory muscles of respiration.  CARDIOVASCULAR: S1, S2 normal. No murmurs, rubs, or gallops.  ABDOMEN: Soft, nontender, nondistended. Bowel sounds present. No organomegaly or mass.  EXTREMITIES: No pedal edema, cyanosis, or clubbing.  NEUROLOGIC: Cranial nerves II through XII are intact. Muscle strength 5/5 in all extremities. Sensation intact. Gait not checked.  PSYCHIATRIC: The patient is alert and oriented x 3.  SKIN: No obvious rash, lesion, or ulcer.   Data Review     CBC w Diff:  Lab Results  Component Value Date   WBC 15.6 (H) 08/10/2017   HGB 12.0 08/10/2017   HGB 14.2 09/04/2015   HCT 37.2 08/10/2017   HCT 44.0 09/04/2015   PLT 319 08/10/2017   PLT 247 09/04/2015   LYMPHOPCT 5 06/29/2017   LYMPHOPCT 15.7 12/03/2013   MONOPCT 9 06/29/2017   MONOPCT 6.1 12/03/2013   EOSPCT 2 06/29/2017   EOSPCT 7.9 12/03/2013   BASOPCT 1 06/29/2017   BASOPCT 0.9 12/03/2013   CMP:  Lab Results  Component Value Date   NA 135 08/12/2017   NA 141 09/04/2015   NA 137 12/03/2013   K 4.0 08/12/2017    K 4.1 12/03/2013   CL 97 (L) 08/12/2017   CL 105 12/03/2013   CO2 28 08/12/2017   CO2 24 12/03/2013   BUN 37 (H) 08/12/2017   BUN 18 09/04/2015   BUN 14 12/03/2013   CREATININE 0.93 08/12/2017   CREATININE 0.86 12/03/2013   GLU 144 07/07/2014   PROT 7.4 08/03/2017   PROT 6.6 09/04/2015   PROT 6.2 (L) 10/08/2013   ALBUMIN 4.1 08/03/2017   ALBUMIN 4.2 09/04/2015   ALBUMIN 2.7 (L) 10/08/2013   BILITOT 0.6 08/03/2017   BILITOT 0.4 09/04/2015   BILITOT 0.7 10/08/2013   ALKPHOS 37 (L) 08/03/2017   ALKPHOS 64 10/08/2013   AST 31 08/03/2017   AST 16 10/08/2013   ALT 27 08/03/2017   ALT 19 10/08/2013  .  Micro Results Recent Results (from the past 240 hour(s))  MRSA PCR Screening     Status: None   Collection Time: 08/09/17  9:22 PM  Result Value Ref Range Status   MRSA by PCR NEGATIVE NEGATIVE Final    Comment:        The GeneXpert MRSA Assay (FDA approved for NASAL specimens only), is one component of a comprehensive MRSA colonization surveillance program. It is not intended to diagnose MRSA infection nor to guide or monitor treatment for MRSA infections. Performed at [facility], [address] [place], [place] [address]   Culture, expectorated sputum-assessment     Status: None   Collection Time: 08/13/17  1:03 AM  Result Value Ref Range Status   Specimen Description SPUTUM  Final   Special Requests Normal  Final   Sputum evaluation   Final    THIS SPECIMEN IS ACCEPTABLE FOR SPUTUM CULTURE Performed at [facility], [address] [place], [place] [address]    Report Status 08/13/2017 FINAL  Final  Culture, respiratory (NON-Expectorated)     Status: None (Preliminary result)   Collection Time: 08/13/17  1:03 AM  Result Value Ref Range Status   Specimen Description   Final    SPUTUM Performed at [facility], [address], [place], [place] [address]    Special Requests   Final    Normal Reflexed from [id number] Performed at  [facility], [address] [place], [place] [address]    Gram Stain   Final    FEW WBC PRESENT,BOTH PMN AND MONONUCLEAR RARE GRAM POSITIVE COCCI    Culture   Final    CULTURE REINCUBATED FOR BETTER GROWTH Performed at [facility], [address], [place], [place] [address]    Report Status PENDING  Incomplete        Code Status Orders  (From admission, onward)        Start     Ordered   08/07/17 1224  Full code  Continuous     08/07/17 1223    Code Status History    Date Active Date Inactive Code Status Order ID Comments User Context   07/01/2017 12:47 07/04/2017 14:42 Full Code [id number]  Milagros Loll, MD Inpatient   06/30/2017 21:00 07/01/2017 12:47 Partial Code [id number]  Altamese Dilling, MD Inpatient   12/09/2016 21:28 12/10/2016 21:12 DNR [id number]  Milagros Loll, MD ED    Advance Directive Documentation     Most Recent Value  Type of Advance Directive  Living will  Pre-existing out of facility DNR order (yellow form or pink MOST form)  No data  "MOST" Form in Place?  No data          Follow-up Information    Yevonne Pax, MD Follow up on 08/17/2017.   Specialties:  Internal Medicine, Pulmonary Disease Why:  hosp f/u ? need for bronch Contact information: [address] [place] [address] [phone]        Lauro Regulus, MD Follow up in 1 week(s).   Specialty:  Internal Medicine Contact information: [address] [facility] [place] [address] [phone]        Marcina Millard, MD On 08/28/2017.   Specialty:  Cardiology Why:  elevated trop, recent hospitalization. appointment @ 10:30 am Contact information: [address] [facility] [place] [address] [phone]        Lauro Regulus, MD On 08/20/2017.   Specialty:  Internal Medicine Why:  appointment @ 2:45pm Contact information: [address] [facility] [place] [address] [phone]           Discharge Medications   Allergies as of 08/14/2017      Reactions   Diphenhydramine Rash   AGITATION/DELIRIUM   Metformin Diarrhea   Oxybutynin Other (See Comments)   dizzy   Amlodipine Rash   Ciprofloxacin Rash   Lisinopril Rash   Penicillins Rash   Family states was a "long time ago"   Saxagliptin Rash      Medication List    STOP taking these medications   cefdinir 300 MG capsule Commonly known as:  OMNICEF     TAKE these medications   amiodarone 400 MG tablet Commonly known as:  PACERONE Take 0.5 tablets (200 mg total) by mouth 2 (two) times daily. What changed:  when to take this   apixaban 2.5 MG Tabs tablet Commonly known as:  ELIQUIS Take 1 tablet (2.5 mg total) by mouth 2 (two) times daily.   atorvastatin 40 MG tablet Commonly known as:  LIPITOR Take 1 tablet (40 mg total) by mouth daily at 6 PM.   benzonatate 200 MG capsule Commonly known as:  TESSALON Take 1 capsule (200 mg total) by mouth 3 (three) times daily.   fenofibrate 145 MG tablet Commonly known as:  TRICOR Take 1 tablet (145 mg total) by mouth daily.   fluticasone 50 MCG/ACT nasal spray Commonly known as:  FLONASE USE ONE SPRAY IN EACH NOSTRIL DAILY   guaiFENesin 600 MG 12 hr tablet Commonly known as:  MUCINEX Take 1 tablet (600 mg total) by mouth 2 (two) times daily for 7 days.   guaiFENesin-codeine 100-10 MG/5ML syrup Take 10 mLs by mouth every 6 (six) hours as needed for cough.   HUMULIN 70/30 KWIKPEN (70-30) 100 UNIT/ML PEN Generic drug:  Insulin Isophane & Regular Human Inject 18 Units into the skin 2 (two) times daily.   HYDROcodone-acetaminophen 5-325 MG tablet Commonly known as:  NORCO/VICODIN Take 1 tablet by mouth 2 (two) times daily. As needed for chronic low back pain   hydrOXYzine 25 MG tablet Commonly known as:  ATARAX/VISTARIL Take 25 mg by mouth 4 (four) times daily as needed for itching.    ipratropium-albuterol 0.5-2.5 (3) MG/3ML Soln Commonly known as:  DUONEB Take 3 mLs by nebulization every 6 (six) hours as needed.   lactobacillus acidophilus Tabs tablet Take 2 tablets by mouth daily.   levothyroxine 50 MCG tablet Commonly known as:  SYNTHROID, LEVOTHROID Take 1 tablet (50 mcg total) by mouth daily. PATIENT NEEDS TO SCHEDULE OFFICE VISIT FOR FOLLOW UP   metoprolol tartrate 100 MG tablet Commonly known as:  LOPRESSOR Take 1 tablet (100 mg total) by mouth 2 (two) times daily.   Potassium 99 MG Tabs Take 1 tablet by mouth daily.   predniSONE 10 MG (21) Tbpk tablet Commonly known as:  STERAPRED UNI-PAK 21 TAB Start at 60mg  taper by 10mg  until complete   VENTOLIN HFA 108 (90 Base) MCG/ACT inhaler Generic drug:  albuterol Inhale 2 puffs into the lungs every 6 (six) hours as needed.   vitamin B-12 1000 MCG tablet Commonly known as:  CYANOCOBALAMIN Take 1,000 mcg by mouth daily.   Vitamin D3 5000 units Tabs Take 5,000 Units by mouth daily.          Total Time in preparing paper work, data evaluation and todays exam - 35 minutes  Auburn Bilberry M.D on 08/14/2017 at 1:50 PM [facility]   Office  [phone]

## 2017-08-14 NOTE — Progress Notes (Signed)
Discharge instructions provided and reviewed with pt.  Pt to discharge in care of daughter. Daughter request speak with attending MD. MD to call pt room.

## 2017-08-14 NOTE — Progress Notes (Signed)
No questions following MD follow up with daughter. Pt discharged in care of daughter to transport home

## 2017-08-15 LAB — CULTURE, RESPIRATORY: SPECIAL REQUESTS: NORMAL

## 2017-08-15 LAB — CULTURE, RESPIRATORY W GRAM STAIN: Culture: NORMAL

## 2017-08-17 ENCOUNTER — Encounter: Payer: Self-pay | Admitting: Internal Medicine

## 2017-08-17 ENCOUNTER — Ambulatory Visit: Payer: Medicare Other | Admitting: Internal Medicine

## 2017-08-17 VITALS — BP 144/60 | HR 56 | Resp 16 | Ht 62.0 in | Wt 153.8 lb

## 2017-08-17 DIAGNOSIS — I482 Chronic atrial fibrillation, unspecified: Secondary | ICD-10-CM

## 2017-08-17 DIAGNOSIS — R0602 Shortness of breath: Secondary | ICD-10-CM

## 2017-08-17 DIAGNOSIS — J9811 Atelectasis: Secondary | ICD-10-CM

## 2017-08-17 DIAGNOSIS — Z8673 Personal history of transient ischemic attack (TIA), and cerebral infarction without residual deficits: Secondary | ICD-10-CM | POA: Insufficient documentation

## 2017-08-17 DIAGNOSIS — J9611 Chronic respiratory failure with hypoxia: Secondary | ICD-10-CM

## 2017-08-17 NOTE — Patient Instructions (Signed)

## 2017-08-17 NOTE — Progress Notes (Signed)
[facility] [place], [place] [address]  Pulmonary Sleep Medicine  Office Visit Note  Patient Name: Peggy [name] DOB: 22-Aug-1928 MRN [id number]  Date of Service: 08/17/2017  Complaints/HPI:  Patient is here for follow-up after admission to the hospital.  She was noted to have atelectasis on the CT scan that was done in the hospital.  She clinically looks better.  She has a little bit of a cough which is not bringing anything up.  She is denying having any chest pain.  No tightness no wheezing.  The concern was that her CT had shown atelectasis and she has a history of smoking.  ROS  General: (-) fever, (-) chills, (-) night sweats, (-) weakness Skin: (-) rashes, (-) itching,. Eyes: (-) visual changes, (-) redness, (-) itching. Nose and Sinuses: (-) nasal stuffiness or itchiness, (-) postnasal drip, (-) nosebleeds, (-) sinus trouble. Mouth and Throat: (-) sore throat, (-) hoarseness. Neck: (-) swollen glands, (-) enlarged thyroid, (-) neck pain. Respiratory: + cough, (-) bloody sputum, + shortness of breath, - wheezing. Cardiovascular: - ankle swelling, (-) chest pain. Lymphatic: (-) lymph node enlargement. Neurologic: (-) numbness, (-) tingling. Psychiatric: (-) anxiety, (-) depression   Current Medication: Outpatient Encounter Medications as of 08/17/2017  Medication Sig  . amiodarone (PACERONE) 400 MG tablet Take 0.5 tablets (200 mg total) by mouth 2 (two) times daily. (Patient taking differently: Take 200 mg by mouth daily. )  . apixaban (ELIQUIS) 2.5 MG TABS tablet Take 1 tablet (2.5 mg total) by mouth 2 (two) times daily.  Marland Kitchen atorvastatin (LIPITOR) 40 MG tablet Take 1 tablet (40 mg total) by mouth daily at 6 PM.  . benzonatate (TESSALON) 200 MG capsule Take 1 capsule (200 mg total) by mouth 3 (three) times daily.  . Cholecalciferol (VITAMIN D3) 5000 units TABS Take 5,000 Units by mouth daily.  . fenofibrate (TRICOR) 145 MG tablet Take 1 tablet (145 mg  total) by mouth daily.  . fluticasone (FLONASE) 50 MCG/ACT nasal spray USE ONE SPRAY IN EACH NOSTRIL DAILY  . guaiFENesin (MUCINEX) 600 MG 12 hr tablet Take 1 tablet (600 mg total) by mouth 2 (two) times daily for 7 days.  Marland Kitchen guaiFENesin-codeine 100-10 MG/5ML syrup Take 10 mLs by mouth every 6 (six) hours as needed for cough.  Marland Kitchen HUMULIN 70/30 KWIKPEN (70-30) 100 UNIT/ML PEN Inject 18 Units into the skin 2 (two) times daily.   Marland Kitchen HYDROcodone-acetaminophen (NORCO/VICODIN) 5-325 MG tablet Take 1 tablet by mouth 2 (two) times daily. As needed for chronic low back pain  . hydrOXYzine (ATARAX/VISTARIL) 25 MG tablet Take 25 mg by mouth 4 (four) times daily as needed for itching.  Marland Kitchen ipratropium-albuterol (DUONEB) 0.5-2.5 (3) MG/3ML SOLN Take 3 mLs by nebulization every 6 (six) hours as needed.  . lactobacillus acidophilus (BACID) TABS tablet Take 2 tablets by mouth daily.  Marland Kitchen levothyroxine (SYNTHROID, LEVOTHROID) 50 MCG tablet Take 1 tablet (50 mcg total) by mouth daily. PATIENT NEEDS TO SCHEDULE OFFICE VISIT FOR FOLLOW UP  . metoprolol tartrate (LOPRESSOR) 100 MG tablet Take 1 tablet (100 mg total) by mouth 2 (two) times daily.  . Potassium 99 MG TABS Take 1 tablet by mouth daily.  . predniSONE (STERAPRED UNI-PAK 21 TAB) 10 MG (21) TBPK tablet Start at 68m taper by 155muntil complete  . triamcinolone cream (KENALOG) 0.1 % APPLY AFFECTED RASH TWICE DAILY UNTIL CLEAR.  . VENTOLIN HFA 108 (90 Base) MCG/ACT inhaler Inhale 2 puffs into the lungs every 6 (  six) hours as needed.  . vitamin B-12 (CYANOCOBALAMIN) 1000 MCG tablet Take 1,000 mcg by mouth daily.   No facility-administered encounter medications on file as of 08/17/2017.     Surgical History: Past Surgical History:  Procedure Laterality Date  . ABDOMINAL HYSTERECTOMY  1970  . BACK SURGERY  2013  . colectomy Right 10/08/2013   Dr. Marina Gravel  . CORONARY ANGIOPLASTY WITH STENT PLACEMENT      Medical History: Past Medical History:  Diagnosis Date  .  A-fib ([place])   . Allergy   . Arthritis   . Diabetes mellitus without complication ([place])   . GERD (gastroesophageal reflux disease)   . Hyperlipidemia   . Hypertension   . TIA (transient ischemic attack)     Family History: Family History  Problem Relation Age of Onset  . Breast cancer Mother   . Pancreatitis Father   . Breast cancer Sister   . Lung cancer Brother   . Melanoma Brother   . Throat cancer Brother     Social History: Social History   Socioeconomic History  . Marital status: Widowed    Spouse name: Not on file  . Number of children: 3  . Years of education: H/S  . Highest education level: Not on file  Social Needs  . Financial resource strain: Not on file  . Food insecurity - worry: Not on file  . Food insecurity - inability: Not on file  . Transportation needs - medical: Not on file  . Transportation needs - non-medical: Not on file  Occupational History  . Occupation: Retired  Tobacco Use  . Smoking status: Former Smoker    Packs/day: 1.00    Years: 30.00    Pack years: 30.00    Last attempt to quit: 11/30/1999    Years since quitting: 17.7  . Smokeless tobacco: Never Used  Substance and Sexual Activity  . Alcohol use: No  . Drug use: No  . Sexual activity: Not on file  Other Topics Concern  . Not on file  Social History Narrative  . Not on file    Vital Signs: Blood pressure (!) 144/60, pulse (!) 56, resp. rate 16, height 5' 2" (1.575 m), weight 153 lb 12.8 oz (69.8 kg), SpO2 98 %.  Examination: General Appearance: The patient is well-developed, well-nourished, and in no distress. Skin: Gross inspection of skin unremarkable. Head: normocephalic, no gross deformities. Eyes: no gross deformities noted. ENT: ears appear grossly normal no exudates. Neck: Supple. No thyromegaly. No LAD. Respiratory:   Good air entry improved on the left side. Cardiovascular: Normal S1 and S2 without murmur or rub. Extremities: No cyanosis. pulses are  equal. Neurologic: Alert and oriented. No involuntary movements.  LABS: Recent Results (from the past 2160 hour(s))  CBC with Differential     Status: Abnormal   Collection Time: 06/29/17 12:24 PM  Result Value Ref Range   WBC 10.3 3.6 - 11.0 K/uL   RBC 4.86 3.80 - 5.20 MIL/uL   Hemoglobin 12.9 12.0 - 16.0 g/dL   HCT 39.6 35.0 - 47.0 %   MCV 81.5 80.0 - 100.0 fL   MCH 26.6 26.0 - 34.0 pg   MCHC 32.6 32.0 - 36.0 g/dL   RDW 14.2 11.5 - 14.5 %   Platelets 217 150 - 440 K/uL   Neutrophils Relative % 83 %   Neutro Abs 8.6 (H) 1.4 - 6.5 K/uL   Lymphocytes Relative 5 %   Lymphs Abs 0.5 (L) 1.0 - 3.6 K/uL  Monocytes Relative 9 %   Monocytes Absolute 0.9 0.2 - 0.9 K/uL   Eosinophils Relative 2 %   Eosinophils Absolute 0.2 0 - 0.7 K/uL   Basophils Relative 1 %   Basophils Absolute 0.1 0 - 0.1 K/uL    Comment: Performed at [facility], [address] [place], [place] [address]  Basic metabolic panel     Status: Abnormal   Collection Time: 06/29/17 12:24 PM  Result Value Ref Range   Sodium 129 (L) 135 - 145 mmol/L   Potassium 4.0 3.5 - 5.1 mmol/L   Chloride 97 (L) 101 - 111 mmol/L   CO2 20 (L) 22 - 32 mmol/L   Glucose, Bld 294 (H) 65 - 99 mg/dL   BUN 28 (H) 6 - 20 mg/dL   Creatinine, Ser 1.24 (H) 0.44 - 1.00 mg/dL   Calcium 8.7 (L) 8.9 - 10.3 mg/dL   GFR calc non Af Amer 38 (L) >60 mL/min   GFR calc Af Amer 44 (L) >60 mL/min    Comment: (NOTE) The eGFR has been calculated using the CKD EPI equation. This calculation has not been validated in all clinical situations. eGFR's persistently <60 mL/min signify possible Chronic Kidney Disease.    Anion gap 12 5 - 15    Comment: Performed at [facility], [address] [place], [place] [address]  Brain natriuretic peptide     Status: Abnormal   Collection Time: 06/29/17 12:24 PM  Result Value Ref Range   B Natriuretic Peptide 412.0 (H) 0.0 - 100.0 pg/mL    Comment: Performed at [facility], [address] [place], [place] [address]  Troponin I     Status: Abnormal   Collection Time: 06/29/17 12:24 PM  Result Value Ref Range   Troponin I 0.52 (HH) <0.03 ng/mL    Comment: CRITICAL RESULT CALLED TO, READ BACK BY AND VERIFIED WITH [facility] MARTIN AT 1405 06/29/17 DAS Performed at [facility], [address] [place], [place] [address]   TSH     Status: None   Collection Time: 06/29/17 12:24 PM  Result Value Ref Range   TSH 3.267 0.350 - 4.500 uIU/mL    Comment: Performed by a 3rd Generation assay with a functional sensitivity of <=0.01 uIU/mL. Performed at [facility], [address] [place], [place] [address]   T4, free     Status: None   Collection Time: 06/29/17 12:24 PM  Result Value Ref Range   Free T4 0.95 0.61 - 1.12 ng/dL    Comment: (NOTE) Biotin ingestion may interfere with free T4 tests. If the results are inconsistent with the TSH level, previous test results, or the clinical presentation, then consider biotin interference. If needed, order repeat testing after stopping biotin. Performed at [facility], [address] [place],  [address]   Troponin I     Status: Abnormal   Collection Time: 06/29/17  2:15 PM  Result Value Ref Range   Troponin I 0.42 (HH) <0.03 ng/mL    Comment: CRITICAL VALUE NOTED. VALUE IS CONSISTENT WITH PREVIOUSLY REPORTED/CALLED VALUE DAS Performed at [facility], [address] [place],  [address]   Basic metabolic panel     Status: Abnormal   Collection Time: 06/30/17 12:26 PM  Result Value Ref Range   Sodium 130 (L) 135 - 145 mmol/L   Potassium 3.9 3.5 - 5.1 mmol/L   Chloride 100 (L) 101 - 111 mmol/L   CO2 21 (L) 22 -  32 mmol/L   Glucose, Bld 321 (H) 65 - 99 mg/dL   BUN 25 (H) 6 - 20 mg/dL   Creatinine, Ser 1.23 (H) 0.44 - 1.00 mg/dL   Calcium 8.6 (L) 8.9 - 10.3 mg/dL   GFR calc non Af Amer 39 (L) >60 mL/min   GFR calc Af Amer 45 (L) >60 mL/min    Comment: (NOTE) The  eGFR has been calculated using the CKD EPI equation. This calculation has not been validated in all clinical situations. eGFR's persistently <60 mL/min signify possible Chronic Kidney Disease.    Anion gap 9 5 - 15    Comment: Performed at [facility], [address] [place], [place] [address]  CBC     Status: Abnormal   Collection Time: 06/30/17 12:26 PM  Result Value Ref Range   WBC 11.4 (H) 3.6 - 11.0 K/uL   RBC 4.53 3.80 - 5.20 MIL/uL   Hemoglobin 12.0 12.0 - 16.0 g/dL   HCT 36.8 35.0 - 47.0 %   MCV 81.3 80.0 - 100.0 fL   MCH 26.5 26.0 - 34.0 pg   MCHC 32.6 32.0 - 36.0 g/dL   RDW 14.4 11.5 - 14.5 %   Platelets 245 150 - 440 K/uL    Comment: Performed at [facility], [address] [place], [place] [address]  Troponin I     Status: Abnormal   Collection Time: 06/30/17 12:26 PM  Result Value Ref Range   Troponin I 0.42 (HH) <0.03 ng/mL    Comment: CRITICAL VALUE NOTED. VALUE IS CONSISTENT WITH PREVIOUSLY REPORTED/CALLED VALUE/HKP Performed at [facility], [address] [place], [place] [address]   Hepatic function panel     Status: Abnormal   Collection Time: 06/30/17 12:26 PM  Result Value Ref Range   Total Protein 6.3 (L) 6.5 - 8.1 g/dL   Albumin 3.1 (L) 3.5 - 5.0 g/dL   AST 82 (H) 15 - 41 U/L   ALT 50 14 - 54 U/L   Alkaline Phosphatase 43 38 - 126 U/L   Total Bilirubin 0.8 0.3 - 1.2 mg/dL   Bilirubin, Direct 0.3 0.1 - 0.5 mg/dL   Indirect Bilirubin 0.5 0.3 - 0.9 mg/dL    Comment: Performed at [facility], [address] [place], [place] [address]  Protime-INR     Status: Abnormal   Collection Time: 06/30/17 12:26 PM  Result Value Ref Range   Prothrombin Time 16.3 (H) 11.4 - 15.2 seconds   INR 1.32     Comment: Performed at [facility], [address] [place], [place] [address]  APTT     Status: None   Collection Time: 06/30/17 12:26 PM  Result Value Ref Range   aPTT 34 24 - 36 seconds    Comment:  Performed at [facility], [address] [place], [place] [address]  Urinalysis, Complete w Microscopic     Status: Abnormal   Collection Time: 06/30/17  4:52 PM  Result Value Ref Range   Color, Urine YELLOW (A) YELLOW   APPearance CLOUDY (A) CLEAR   Specific Gravity, Urine 1.018 1.005 - 1.030   pH 5.0 5.0 - 8.0   Glucose, UA >=500 (A) NEGATIVE mg/dL   Hgb urine dipstick SMALL (A) NEGATIVE   Bilirubin Urine NEGATIVE NEGATIVE   Ketones, ur NEGATIVE NEGATIVE mg/dL   Protein, ur NEGATIVE NEGATIVE mg/dL   Nitrite NEGATIVE NEGATIVE   Leukocytes, UA NEGATIVE NEGATIVE   RBC / HPF 6-30 0 -  5 RBC/hpf   WBC, UA 6-30 0 - 5 WBC/hpf   Bacteria, UA RARE (A) NONE SEEN   Squamous Epithelial / LPF 0-5 (A) NONE SEEN   Mucus PRESENT    Uric Acid Crys, UA PRESENT    Crystals PRESENT (A) NEGATIVE    Comment: Performed at [facility], [address] [place], [place] [address]  Glucose, capillary     Status: Abnormal   Collection Time: 06/30/17  7:28 PM  Result Value Ref Range   Glucose-Capillary 269 (H) 65 - 99 mg/dL  Glucose, capillary     Status: Abnormal   Collection Time: 06/30/17  9:16 PM  Result Value Ref Range   Glucose-Capillary 303 (H) 65 - 99 mg/dL  Glucose, capillary     Status: Abnormal   Collection Time: 06/30/17 11:36 PM  Result Value Ref Range   Glucose-Capillary 221 (H) 65 - 99 mg/dL  Basic metabolic panel     Status: Abnormal   Collection Time: 07/01/17  4:02 AM  Result Value Ref Range   Sodium 132 (L) 135 - 145 mmol/L   Potassium 3.6 3.5 - 5.1 mmol/L   Chloride 103 101 - 111 mmol/L   CO2 20 (L) 22 - 32 mmol/L   Glucose, Bld 212 (H) 65 - 99 mg/dL   BUN 20 6 - 20 mg/dL   Creatinine, Ser 0.99 0.44 - 1.00 mg/dL   Calcium 8.3 (L) 8.9 - 10.3 mg/dL   GFR calc non Af Amer 50 (L) >60 mL/min   GFR calc Af Amer 58 (L) >60 mL/min    Comment: (NOTE) The eGFR has been calculated using the CKD EPI equation. This calculation has not been validated in all clinical  situations. eGFR's persistently <60 mL/min signify possible Chronic Kidney Disease.    Anion gap 9 5 - 15    Comment: Performed at [facility], [address] [place], [place] [address]  CBC     Status: Abnormal   Collection Time: 07/01/17  4:02 AM  Result Value Ref Range   WBC 10.7 3.6 - 11.0 K/uL   RBC 4.10 3.80 - 5.20 MIL/uL   Hemoglobin 10.9 (L) 12.0 - 16.0 g/dL   HCT 33.2 (L) 35.0 - 47.0 %   MCV 80.9 80.0 - 100.0 fL   MCH 26.7 26.0 - 34.0 pg   MCHC 33.0 32.0 - 36.0 g/dL   RDW 14.3 11.5 - 14.5 %   Platelets 249 150 - 440 K/uL    Comment: Performed at [facility], [address] [place], [place] [address]  Glucose, capillary     Status: Abnormal   Collection Time: 07/01/17  7:57 AM  Result Value Ref Range   Glucose-Capillary 214 (H) 65 - 99 mg/dL  Glucose, capillary     Status: Abnormal   Collection Time: 07/01/17 12:22 PM  Result Value Ref Range   Glucose-Capillary 158 (H) 65 - 99 mg/dL  Glucose, capillary     Status: Abnormal   Collection Time: 07/01/17  5:12 PM  Result Value Ref Range   Glucose-Capillary 258 (H) 65 - 99 mg/dL  Glucose, capillary     Status: Abnormal   Collection Time: 07/01/17  8:44 PM  Result Value Ref Range   Glucose-Capillary 203 (H) 65 - 99 mg/dL  Glucose, capillary     Status: Abnormal   Collection Time: 07/02/17  7:52 AM  Result Value Ref Range   Glucose-Capillary 153 (H) 65 - 99 mg/dL  Glucose, capillary  Status: Abnormal   Collection Time: 07/02/17 12:01 PM  Result Value Ref Range   Glucose-Capillary 215 (H) 65 - 99 mg/dL  Glucose, capillary     Status: Abnormal   Collection Time: 07/02/17  4:40 PM  Result Value Ref Range   Glucose-Capillary 256 (H) 65 - 99 mg/dL  Glucose, capillary     Status: Abnormal   Collection Time: 07/02/17  9:55 PM  Result Value Ref Range   Glucose-Capillary 213 (H) 65 - 99 mg/dL  Basic metabolic panel     Status: Abnormal   Collection Time: 07/03/17  5:56 AM  Result Value Ref Range    Sodium 137 135 - 145 mmol/L   Potassium 3.4 (L) 3.5 - 5.1 mmol/L   Chloride 98 (L) 101 - 111 mmol/L   CO2 27 22 - 32 mmol/L   Glucose, Bld 189 (H) 65 - 99 mg/dL   BUN 38 (H) 6 - 20 mg/dL   Creatinine, Ser 1.20 (H) 0.44 - 1.00 mg/dL   Calcium 9.4 8.9 - 10.3 mg/dL   GFR calc non Af Amer 40 (L) >60 mL/min   GFR calc Af Amer 46 (L) >60 mL/min    Comment: (NOTE) The eGFR has been calculated using the CKD EPI equation. This calculation has not been validated in all clinical situations. eGFR's persistently <60 mL/min signify possible Chronic Kidney Disease.    Anion gap 12 5 - 15    Comment: Performed at [facility], [address] [place], [place] [address]  Magnesium     Status: None   Collection Time: 07/03/17  5:56 AM  Result Value Ref Range   Magnesium 1.9 1.7 - 2.4 mg/dL    Comment: Performed at [facility], [address] [place], [place] [address]  Glucose, capillary     Status: Abnormal   Collection Time: 07/03/17  7:48 AM  Result Value Ref Range   Glucose-Capillary 249 (H) 65 - 99 mg/dL  Glucose, capillary     Status: Abnormal   Collection Time: 07/03/17 12:10 PM  Result Value Ref Range   Glucose-Capillary 224 (H) 65 - 99 mg/dL  Glucose, capillary     Status: Abnormal   Collection Time: 07/03/17  4:57 PM  Result Value Ref Range   Glucose-Capillary 191 (H) 65 - 99 mg/dL  Glucose, capillary     Status: Abnormal   Collection Time: 07/03/17  9:16 PM  Result Value Ref Range   Glucose-Capillary 184 (H) 65 - 99 mg/dL  Glucose, capillary     Status: Abnormal   Collection Time: 07/04/17  7:43 AM  Result Value Ref Range   Glucose-Capillary 216 (H) 65 - 99 mg/dL  CBC     Status: Abnormal   Collection Time: 08/03/17  4:10 PM  Result Value Ref Range   WBC 10.0 3.6 - 11.0 K/uL   RBC 5.01 3.80 - 5.20 MIL/uL   Hemoglobin 13.5 12.0 - 16.0 g/dL   HCT 41.5 35.0 - 47.0 %   MCV 82.8 80.0 - 100.0 fL   MCH 27.0 26.0 - 34.0 pg   MCHC 32.6 32.0 - 36.0 g/dL    RDW 16.1 (H) 11.5 - 14.5 %   Platelets 286 150 - 440 K/uL    Comment: Performed at [facility], [address] [place], [place] [address]  Troponin I     Status: Abnormal   Collection Time: 08/03/17  4:10 PM  Result Value Ref Range   Troponin I 0.44 (HH) <0.03 ng/mL  Comment: CRITICAL RESULT CALLED TO, READ BACK BY AND VERIFIED WITH DR Alfred Levins ON 08/03/17 AT 1652 [facility] Performed at [facility], [address] [place], [place] [address]   Comprehensive metabolic panel     Status: Abnormal   Collection Time: 08/03/17  4:10 PM  Result Value Ref Range   Sodium 139 135 - 145 mmol/L   Potassium 4.2 3.5 - 5.1 mmol/L   Chloride 105 101 - 111 mmol/L   CO2 24 22 - 32 mmol/L   Glucose, Bld 179 (H) 65 - 99 mg/dL   BUN 25 (H) 6 - 20 mg/dL   Creatinine, Ser 1.23 (H) 0.44 - 1.00 mg/dL   Calcium 10.0 8.9 - 10.3 mg/dL   Total Protein 7.4 6.5 - 8.1 g/dL   Albumin 4.1 3.5 - 5.0 g/dL   AST 31 15 - 41 U/L   ALT 27 14 - 54 U/L   Alkaline Phosphatase 37 (L) 38 - 126 U/L   Total Bilirubin 0.6 0.3 - 1.2 mg/dL   GFR calc non Af Amer 39 (L) >60 mL/min   GFR calc Af Amer 45 (L) >60 mL/min    Comment: (NOTE) The eGFR has been calculated using the CKD EPI equation. This calculation has not been validated in all clinical situations. eGFR's persistently <60 mL/min signify possible Chronic Kidney Disease.    Anion gap 10 5 - 15    Comment: Performed at [facility], [address] [place], [place] [address]  Magnesium     Status: None   Collection Time: 08/03/17  4:10 PM  Result Value Ref Range   Magnesium 1.7 1.7 - 2.4 mg/dL    Comment: Performed at [facility], [address] [place], [place] [address]  Basic metabolic panel     Status: Abnormal   Collection Time: 08/07/17 12:17 AM  Result Value Ref Range   Sodium 137 135 - 145 mmol/L   Potassium 5.1 3.5 - 5.1 mmol/L   Chloride 105 101 - 111 mmol/L   CO2 22 22 - 32 mmol/L   Glucose, Bld 246 (H) 65 - 99  mg/dL   BUN 26 (H) 6 - 20 mg/dL   Creatinine, Ser 1.69 (H) 0.44 - 1.00 mg/dL   Calcium 9.4 8.9 - 10.3 mg/dL   GFR calc non Af Amer 26 (L) >60 mL/min   GFR calc Af Amer 30 (L) >60 mL/min    Comment: (NOTE) The eGFR has been calculated using the CKD EPI equation. This calculation has not been validated in all clinical situations. eGFR's persistently <60 mL/min signify possible Chronic Kidney Disease.    Anion gap 10 5 - 15    Comment: Performed at [facility], [address] [place], [place] [address]  CBC     Status: Abnormal   Collection Time: 08/07/17 12:17 AM  Result Value Ref Range   WBC 17.0 (H) 3.6 - 11.0 K/uL   RBC 4.99 3.80 - 5.20 MIL/uL   Hemoglobin 13.3 12.0 - 16.0 g/dL   HCT 41.8 35.0 - 47.0 %   MCV 83.9 80.0 - 100.0 fL   MCH 26.7 26.0 - 34.0 pg   MCHC 31.8 (L) 32.0 - 36.0 g/dL   RDW 16.2 (H) 11.5 - 14.5 %   Platelets 416 150 - 440 K/uL    Comment: Performed at [facility], [address], [place], [place] [address]  Troponin I     Status: Abnormal   Collection Time: 08/07/17 12:17 AM  Result Value Ref Range   Troponin I 0.38 (HH) <0.03 ng/mL    Comment: CRITICAL RESULT CALLED TO, READ BACK BY AND VERIFIED WITH DEIJA SCOTT ON 08/07/17 AT [place] [facility] Performed at [facility], [address] [place], [place] [address]   Blood gas, venous     Status: Abnormal   Collection Time: 08/07/17 12:20 AM  Result Value Ref Range   FIO2 0.60    Delivery systems BILEVEL POSITIVE AIRWAY PRESSURE    pH, Ven 7.15 (LL) 7.250 - 7.430    Comment: CRITICAL RESULT CALLED TO, READ BACK BY AND VERIFIED WITH: DR.R.BROWN AT 0030 ON 08/07/17 KSL    pCO2, Ven 70 (H) 44.0 - 60.0 mmHg   pO2, Ven 136.0 (H) 32.0 - 45.0 mmHg   Bicarbonate 24.4 20.0 - 28.0 mmol/L   Acid-base deficit 5.9 (H) 0.0 - 2.0 mmol/L   O2 Saturation 98.3 %   Patient temperature 37.0    Collection site VENOUS    Sample type VENOUS     Comment: Performed at [facility], [address] [place], [place] [address]  Blood gas, arterial     Status: Abnormal   Collection Time: 08/07/17  1:43 AM  Result Value Ref Range   FIO2 0.60    Delivery systems BILEVEL POSITIVE AIRWAY PRESSURE    Inspiratory PAP 12    Expiratory PAP 6.0    pH, Arterial 7.28 (L) 7.350 - 7.450   pCO2 arterial 49 (H) 32.0 - 48.0 mmHg   pO2, Arterial 232 (H) 83.0 - 108.0 mmHg   Bicarbonate 23.0 20.0 - 28.0 mmol/L   Acid-base deficit 4.1 (H) 0.0 - 2.0 mmol/L   O2 Saturation 99.8 %   Patient temperature 37.0    Collection site RIGHT RADIAL    Sample type ARTERIAL DRAW    Allens test (pass/fail) PASS PASS   Mechanical Rate 8     Comment: Performed at [facility], [address] [place], [place] [address]  TSH     Status: None   Collection Time: 08/07/17 12:50 PM  Result Value Ref Range   TSH 1.393 0.350 - 4.500 uIU/mL    Comment: Performed by a 3rd Generation assay with a functional sensitivity of <=0.01 uIU/mL. Performed at [facility], [address] [place], [place] [address]   Hemoglobin A1c     Status: Abnormal   Collection Time: 08/07/17 12:50 PM  Result Value Ref Range   Hgb A1c MFr Bld 8.4 (H) 4.8 - 5.6 %    Comment: (NOTE)         Prediabetes: 5.7 - 6.4         Diabetes: >6.4         Glycemic control for adults with diabetes: <7.0    Mean Plasma Glucose 194 mg/dL    Comment: (NOTE) Performed At: [facility] [place], [place] [id number] Rush Farmer MD [id number] Performed at [facility], [address] [place], [place] [address]   Troponin I     Status: Abnormal   Collection Time: 08/07/17 12:50 PM  Result Value Ref Range   Troponin I 0.57 (HH) <0.03 ng/mL    Comment: CRITICAL VALUE NOTED. VALUE IS CONSISTENT WITH PREVIOUSLY REPORTED/CALLED VALUE AKT Performed at [facility], [address] [place], [place] [address]   Blood gas, arterial     Status: Abnormal   Collection Time: 08/07/17   2:05 PM  Result Value Ref Range   FIO2  0.30    Delivery systems NASAL CANNULA    pH, Arterial 7.39 7.350 - 7.450   pCO2 arterial 41 32.0 - 48.0 mmHg   pO2, Arterial 61 (L) 83.0 - 108.0 mmHg   Bicarbonate 24.8 20.0 - 28.0 mmol/L   Acid-base deficit 0.2 0.0 - 2.0 mmol/L   O2 Saturation 90.7 %   Patient temperature 37.0    Collection site RIGHT BRACHIAL    Sample type ARTERIAL DRAW    Allens test (pass/fail) ARTERIAL DRAW (A) PASS    Comment: Performed at [facility], [address] [place], [place] [address]  Glucose, capillary     Status: Abnormal   Collection Time: 08/07/17  4:31 PM  Result Value Ref Range   Glucose-Capillary 337 (H) 65 - 99 mg/dL  Troponin I     Status: Abnormal   Collection Time: 08/07/17  6:30 PM  Result Value Ref Range   Troponin I 0.40 (HH) <0.03 ng/mL    Comment: CRITICAL VALUE NOTED. VALUE IS CONSISTENT WITH PREVIOUSLY REPORTED/CALLED VALUE.PMH Performed at [facility], [address] [place], [place] [address]   Glucose, capillary     Status: Abnormal   Collection Time: 08/07/17  8:06 PM  Result Value Ref Range   Glucose-Capillary 323 (H) 65 - 99 mg/dL  Troponin I     Status: Abnormal   Collection Time: 08/08/17 12:55 AM  Result Value Ref Range   Troponin I 0.39 (HH) <0.03 ng/mL    Comment: CRITICAL VALUE NOTED. VALUE IS CONSISTENT WITH PREVIOUSLY REPORTED/CALLED VALUE.PMH Performed at [facility], [address] [place], [place] [address]   Glucose, capillary     Status: Abnormal   Collection Time: 08/08/17  7:36 AM  Result Value Ref Range   Glucose-Capillary 270 (H) 65 - 99 mg/dL  Troponin I (q 6hr x 3)     Status: Abnormal   Collection Time: 08/08/17 11:51 AM  Result Value Ref Range   Troponin I 0.38 (HH) <0.03 ng/mL    Comment: CRITICAL VALUE NOTED. VALUE IS CONSISTENT WITH PREVIOUSLY REPORTED/CALLED VALUE KBH Performed at [facility], [address] [place], [place] [address]   Glucose,  capillary     Status: Abnormal   Collection Time: 08/08/17 12:21 PM  Result Value Ref Range   Glucose-Capillary 253 (H) 65 - 99 mg/dL  Glucose, capillary     Status: Abnormal   Collection Time: 08/08/17  4:37 PM  Result Value Ref Range   Glucose-Capillary 269 (H) 65 - 99 mg/dL  Troponin I (q 6hr x 3)     Status: Abnormal   Collection Time: 08/08/17  5:10 PM  Result Value Ref Range   Troponin I 0.39 (HH) <0.03 ng/mL    Comment: CRITICAL VALUE NOTED. VALUE IS CONSISTENT WITH PREVIOUSLY REPORTED/CALLED VALUE.MSS Performed at [facility], [address] [place], [place] [address]   Glucose, capillary     Status: Abnormal   Collection Time: 08/08/17  9:07 PM  Result Value Ref Range   Glucose-Capillary 302 (H) 65 - 99 mg/dL  Glucose, capillary     Status: Abnormal   Collection Time: 08/09/17  7:27 AM  Result Value Ref Range   Glucose-Capillary 265 (H) 65 - 99 mg/dL  Glucose, capillary     Status: Abnormal   Collection Time: 08/09/17 12:08 PM  Result Value Ref Range   Glucose-Capillary 226 (H) 65 - 99 mg/dL   Comment 1 Notify RN    Comment 2 Document in Chart  Influenza panel by PCR (type A & B)     Status: None   Collection Time: 08/09/17  2:59 PM  Result Value Ref Range   Influenza A By PCR NEGATIVE NEGATIVE   Influenza B By PCR NEGATIVE NEGATIVE    Comment: (NOTE) The Xpert Xpress Flu assay is intended as an aid in the diagnosis of  influenza and should not be used as a sole basis for treatment.  This  assay is FDA approved for nasopharyngeal swab specimens only. Nasal  washings and aspirates are unacceptable for Xpert Xpress Flu testing. Performed at [facility], [address] [place], [place] [address]   Glucose, capillary     Status: Abnormal   Collection Time: 08/09/17  4:28 PM  Result Value Ref Range   Glucose-Capillary 226 (H) 65 - 99 mg/dL  Glucose, capillary     Status: Abnormal   Collection Time: 08/09/17  8:51 PM  Result Value Ref Range    Glucose-Capillary 292 (H) 65 - 99 mg/dL  MRSA PCR Screening     Status: None   Collection Time: 08/09/17  9:22 PM  Result Value Ref Range   MRSA by PCR NEGATIVE NEGATIVE    Comment:        The GeneXpert MRSA Assay (FDA approved for NASAL specimens only), is one component of a comprehensive MRSA colonization surveillance program. It is not intended to diagnose MRSA infection nor to guide or monitor treatment for MRSA infections. Performed at [facility], [address] [place], [place] [address]   Blood gas, arterial     Status: Abnormal   Collection Time: 08/09/17 11:41 PM  Result Value Ref Range   FIO2 0.55    Delivery systems VENTURI MASK    pH, Arterial 7.37 7.350 - 7.450   pCO2 arterial 42 32.0 - 48.0 mmHg   pO2, Arterial 56 (L) 83.0 - 108.0 mmHg   Bicarbonate 24.3 20.0 - 28.0 mmol/L   Acid-base deficit 1.1 0.0 - 2.0 mmol/L   O2 Saturation 87.8 %   Patient temperature 37.0    Collection site RIGHT RADIAL    Sample type ARTERIAL DRAW    Allens test (pass/fail) PASS PASS    Comment: Performed at [facility], [address] [place], [place] [address]  Glucose, capillary     Status: Abnormal   Collection Time: 08/10/17  7:31 AM  Result Value Ref Range   Glucose-Capillary 233 (H) 65 - 99 mg/dL  CBC     Status: Abnormal   Collection Time: 08/10/17 10:04 AM  Result Value Ref Range   WBC 15.6 (H) 3.6 - 11.0 K/uL   RBC 4.45 3.80 - 5.20 MIL/uL   Hemoglobin 12.0 12.0 - 16.0 g/dL   HCT 37.2 35.0 - 47.0 %   MCV 83.7 80.0 - 100.0 fL   MCH 26.9 26.0 - 34.0 pg   MCHC 32.1 32.0 - 36.0 g/dL   RDW 16.7 (H) 11.5 - 14.5 %   Platelets 319 150 - 440 K/uL    Comment: Performed at [facility], [address] [place], [place] [address]  Basic metabolic panel     Status: Abnormal   Collection Time: 08/10/17 10:04 AM  Result Value Ref Range   Sodium 136 135 - 145 mmol/L   Potassium 4.1 3.5 - 5.1 mmol/L   Chloride 102 101 - 111 mmol/L   CO2 25 22  - 32 mmol/L   Glucose, Bld 361 (H) 65 - 99 mg/dL  BUN 44 (H) 6 - 20 mg/dL   Creatinine, Ser 1.10 (H) 0.44 - 1.00 mg/dL   Calcium 8.8 (L) 8.9 - 10.3 mg/dL   GFR calc non Af Amer 44 (L) >60 mL/min   GFR calc Af Amer 51 (L) >60 mL/min    Comment: (NOTE) The eGFR has been calculated using the CKD EPI equation. This calculation has not been validated in all clinical situations. eGFR's persistently <60 mL/min signify possible Chronic Kidney Disease.    Anion gap 9 5 - 15    Comment: Performed at [facility], [address] [place], [place] [address]  Glucose, capillary     Status: Abnormal   Collection Time: 08/10/17 11:26 AM  Result Value Ref Range   Glucose-Capillary 298 (H) 65 - 99 mg/dL  Glucose, capillary     Status: Abnormal   Collection Time: 08/10/17  5:25 PM  Result Value Ref Range   Glucose-Capillary 183 (H) 65 - 99 mg/dL  Glucose, capillary     Status: Abnormal   Collection Time: 08/10/17  8:59 PM  Result Value Ref Range   Glucose-Capillary 272 (H) 65 - 99 mg/dL  Glucose, capillary     Status: Abnormal   Collection Time: 08/11/17  7:26 AM  Result Value Ref Range   Glucose-Capillary 193 (H) 65 - 99 mg/dL  Glucose, capillary     Status: Abnormal   Collection Time: 08/11/17 11:47 AM  Result Value Ref Range   Glucose-Capillary 345 (H) 65 - 99 mg/dL  Glucose, capillary     Status: Abnormal   Collection Time: 08/11/17  4:35 PM  Result Value Ref Range   Glucose-Capillary 360 (H) 65 - 99 mg/dL  Glucose, capillary     Status: Abnormal   Collection Time: 08/11/17  9:12 PM  Result Value Ref Range   Glucose-Capillary 269 (H) 65 - 99 mg/dL  Glucose, capillary     Status: Abnormal   Collection Time: 08/12/17  7:41 AM  Result Value Ref Range   Glucose-Capillary 219 (H) 65 - 99 mg/dL  Glucose, capillary     Status: Abnormal   Collection Time: 08/12/17 11:47 AM  Result Value Ref Range   Glucose-Capillary 227 (H) 65 - 99 mg/dL  Basic metabolic panel     Status:  Abnormal   Collection Time: 08/12/17  1:01 PM  Result Value Ref Range   Sodium 135 135 - 145 mmol/L   Potassium 4.0 3.5 - 5.1 mmol/L   Chloride 97 (L) 101 - 111 mmol/L   CO2 28 22 - 32 mmol/L   Glucose, Bld 238 (H) 65 - 99 mg/dL   BUN 37 (H) 6 - 20 mg/dL   Creatinine, Ser 0.93 0.44 - 1.00 mg/dL   Calcium 8.9 8.9 - 10.3 mg/dL   GFR calc non Af Amer 54 (L) >60 mL/min   GFR calc Af Amer >60 >60 mL/min    Comment: (NOTE) The eGFR has been calculated using the CKD EPI equation. This calculation has not been validated in all clinical situations. eGFR's persistently <60 mL/min signify possible Chronic Kidney Disease.    Anion gap 10 5 - 15    Comment: Performed at [facility], [place], [place] [address]  Procalcitonin - Baseline     Status: None   Collection Time: 08/12/17  1:01 PM  Result Value Ref Range   Procalcitonin <0.10 ng/mL    Comment:        Interpretation: PCT (Procalcitonin) <= 0.5 ng/mL: Systemic  infection (sepsis) is not likely. Local bacterial infection is possible. (NOTE)       Sepsis PCT Algorithm           Lower Respiratory Tract                                      Infection PCT Algorithm    ----------------------------     ----------------------------         PCT < 0.25 ng/mL                PCT < 0.10 ng/mL         Strongly encourage             Strongly discourage   discontinuation of antibiotics    initiation of antibiotics    ----------------------------     -----------------------------       PCT 0.25 - 0.50 ng/mL            PCT 0.10 - 0.25 ng/mL               OR       >80% decrease in PCT            Discourage initiation of                                            antibiotics      Encourage discontinuation           of antibiotics    ----------------------------     -----------------------------         PCT >= 0.50 ng/mL              PCT 0.26 - 0.50 ng/mL               AND        <80% decrease in PCT             Encourage  initiation of                                             antibiotics       Encourage continuation           of antibiotics    ----------------------------     -----------------------------        PCT >= 0.50 ng/mL                  PCT > 0.50 ng/mL               AND         increase in PCT                  Strongly encourage                                      initiation of antibiotics    Strongly encourage escalation           of antibiotics                                     -----------------------------  PCT <= 0.25 ng/mL                                                 OR                                        > 80% decrease in PCT                                     Discontinue / Do not initiate                                             antibiotics Performed at [facility], [address] [place], [place] [address]   Glucose, capillary     Status: Abnormal   Collection Time: 08/12/17  4:43 PM  Result Value Ref Range   Glucose-Capillary 267 (H) 65 - 99 mg/dL  Glucose, capillary     Status: Abnormal   Collection Time: 08/12/17  9:00 PM  Result Value Ref Range   Glucose-Capillary 347 (H) 65 - 99 mg/dL  Culture, expectorated sputum-assessment     Status: None   Collection Time: 08/13/17  1:03 AM  Result Value Ref Range   Specimen Description SPUTUM    Special Requests Normal    Sputum evaluation      THIS SPECIMEN IS ACCEPTABLE FOR SPUTUM CULTURE Performed at [facility], [address], [place], [place] [address]    Report Status 08/13/2017 FINAL   Culture, respiratory (NON-Expectorated)     Status: None   Collection Time: 08/13/17  1:03 AM  Result Value Ref Range   Specimen Description      SPUTUM Performed at [facility], [address] [place], [place] [address]    Special Requests      Normal Reflexed from [phone] Performed at [facility], [place], [place]  [address]    Gram Stain      FEW WBC PRESENT,BOTH PMN AND MONONUCLEAR RARE GRAM POSITIVE COCCI    Culture      Consistent with normal respiratory flora. Performed at [facility], [address], [place], [place] [address]    Report Status 08/15/2017 FINAL   Glucose, capillary     Status: Abnormal   Collection Time: 08/13/17  7:38 AM  Result Value Ref Range   Glucose-Capillary 191 (H) 65 - 99 mg/dL  Procalcitonin - Baseline     Status: None   Collection Time: 08/13/17  9:00 AM  Result Value Ref Range   Procalcitonin <0.10 ng/mL    Comment:        Interpretation: PCT (Procalcitonin) <= 0.5 ng/mL: Systemic infection (sepsis) is not likely. Local bacterial infection is possible. (NOTE)       Sepsis PCT Algorithm           Lower Respiratory Tract  Infection PCT Algorithm    ----------------------------     ----------------------------         PCT < 0.25 ng/mL                PCT < 0.10 ng/mL         Strongly encourage             Strongly discourage   discontinuation of antibiotics    initiation of antibiotics    ----------------------------     -----------------------------       PCT 0.25 - 0.50 ng/mL            PCT 0.10 - 0.25 ng/mL               OR       >80% decrease in PCT            Discourage initiation of                                            antibiotics      Encourage discontinuation           of antibiotics    ----------------------------     -----------------------------         PCT >= 0.50 ng/mL              PCT 0.26 - 0.50 ng/mL               AND        <80% decrease in PCT             Encourage initiation of                                             antibiotics       Encourage continuation           of antibiotics    ----------------------------     -----------------------------        PCT >= 0.50 ng/mL                  PCT > 0.50 ng/mL               AND         increase in PCT                  Strongly encourage                                       initiation of antibiotics    Strongly encourage escalation           of antibiotics                                     -----------------------------                                           PCT <= 0.25 ng/mL  OR                                        > 80% decrease in PCT                                     Discontinue / Do not initiate                                             antibiotics Performed at [facility], [address] [place], [place] [address]   Glucose, capillary     Status: Abnormal   Collection Time: 08/13/17 11:52 AM  Result Value Ref Range   Glucose-Capillary 311 (H) 65 - 99 mg/dL  Glucose, capillary     Status: Abnormal   Collection Time: 08/13/17  4:19 PM  Result Value Ref Range   Glucose-Capillary 312 (H) 65 - 99 mg/dL  Glucose, capillary     Status: Abnormal   Collection Time: 08/13/17  9:46 PM  Result Value Ref Range   Glucose-Capillary 356 (H) 65 - 99 mg/dL   Comment 1 Notify RN   Glucose, capillary     Status: None   Collection Time: 08/14/17  7:40 AM  Result Value Ref Range   Glucose-Capillary 77 65 - 99 mg/dL    Radiology: Ct Chest Wo Contrast  Result Date: 08/07/2017 CLINICAL DATA:  Chest pain or shortness of breath. Respiratory distress, progressive. EXAM: CT CHEST WITHOUT CONTRAST TECHNIQUE: Multidetector CT imaging of the chest was performed following the standard protocol without IV contrast. COMPARISON:  Radiographs earlier this day as well as 08/03/2017. Chest CT 03/06/2017 FINDINGS: Cardiovascular: Atherosclerosis of the thoracic aorta. No aneurysm. No periaortic stranding. There are coronary artery calcifications/stents. No pericardial effusion. Mediastinum/Nodes: Small mediastinal nodes largest measuring 9 mm short axis. Limited assessment for hilar adenopathy given lack of IV contrast. No bulky hilar adenopathy. The esophagus is decompressed.  Lungs/Pleura: Mild emphysema. Central bronchial thickening with mucus/debris in the left mainstem and lower lobe bronchus. Short-segment mucous plugging/bronchial occlusion in the left lower lobe. Patchy ground-glass left lower lobe opacity, partially obscured by breathing motion. There is mild smooth septal thickening suggesting pulmonary edema. Biapical pleuroparenchymal scarring. There are multiple small pulmonary nodules, some of which are calcified. The largest measures 8 mm in the superior segments of the left lower lobe image 35 series 4. This is unchanged dating back to 2015 and considered benign. No new pulmonary nodule or mass. No pleural effusion. Upper Abdomen: Simple cyst in the left lobe of the liver. No acute finding. Musculoskeletal: There are no acute or suspicious osseous abnormalities. IMPRESSION: 1. Mild emphysema with bronchial thickening and scattered debris/mucus in the left lower lobe bronchus. Areas of mucous plugging/bronchial occlusion in the left lower lobe. Patchy left lower lobe ground-glass opacities may be postobstructive atelectasis or pneumonia. 2. Smooth septal thickening consistent with pulmonary edema. 3. Benign pulmonary nodules, stable dating back to 2015 CT. Biapical pleuroparenchymal scarring is unchanged. 4. No pleural effusion. Aortic Atherosclerosis (ICD10-I70.0) and Emphysema (ICD10-J43.9). Electronically Signed   By: Jeb Levering M.D.   On: 08/07/2017 02:47   Dg Chest Port 1 View  Result Date: 08/07/2017 CLINICAL DATA:  Initial evaluation  for acute respiratory distress. EXAM: PORTABLE CHEST 1 VIEW COMPARISON:  Prior radiograph from 08/03/2017. FINDINGS: Mild cardiomegaly, stable.  Mediastinal silhouette normal. Lungs normally inflated. Underlying COPD. There is subtly increased prominence of the interstitial markings as compared to previous, suggesting pulmonary interstitial edema. No consolidative airspace disease. No pleural effusion. No pneumothorax. No acute  osseous abnormality. IMPRESSION: 1. Diffuse prominence of the interstitial markings, consistent with mild diffuse pulmonary interstitial edema. 2. Underlying chronic bronchitic changes/COPD. Electronically Signed   By: Jeannine Boga M.D.   On: 08/07/2017 00:56    No results found.  Dg Chest 2 View  Result Date: 08/09/2017 CLINICAL DATA:  Shortness of breath. EXAM: CHEST - 2 VIEW COMPARISON:  Chest CT and chest x-ray dated August 07, 2017. FINDINGS: Stable mild cardiomegaly and interstitial pulmonary edema. Increased consolidation in the left lower lobe. No pneumothorax or large pleural effusion. No acute osseous abnormality. IMPRESSION: 1. Increasing consolidation in the left lower lobe, concerning for pneumonia. 2. Unchanged mild cardiomegaly and pulmonary interstitial edema. Electronically Signed   By: Titus Dubin M.D.   On: 08/09/2017 13:53   Dg Chest 2 View  Result Date: 08/03/2017 CLINICAL DATA:  Patient had onset of palpitations last night associated with shortness of breath. The patient reports a 4 day history of cough. History of hypertension, atrial fibrillation, coronary artery disease with previous MI, previous CVA. Former smoker. EXAM: CHEST  2 VIEW COMPARISON:  Portable chest x-ray of June 29, 2017 FINDINGS: The lungs are well-expanded. The interstitial markings are coarse. There is stable biapical pleural thickening. There is no alveolar infiltrate. The heart and pulmonary vascularity are normal. There is calcification in the wall of the aortic arch and descending thoracic aorta. The bony thorax is unremarkable. IMPRESSION: Chronic bronchitic changes, stable.  No acute pneumonia nor CHF. Thoracic aortic atherosclerosis. Electronically Signed   By: David  Martinique M.D.   On: 08/03/2017 17:00   Ct Chest Wo Contrast  Result Date: 08/07/2017 CLINICAL DATA:  Chest pain or shortness of breath. Respiratory distress, progressive. EXAM: CT CHEST WITHOUT CONTRAST TECHNIQUE: Multidetector  CT imaging of the chest was performed following the standard protocol without IV contrast. COMPARISON:  Radiographs earlier this day as well as 08/03/2017. Chest CT 03/06/2017 FINDINGS: Cardiovascular: Atherosclerosis of the thoracic aorta. No aneurysm. No periaortic stranding. There are coronary artery calcifications/stents. No pericardial effusion. Mediastinum/Nodes: Small mediastinal nodes largest measuring 9 mm short axis. Limited assessment for hilar adenopathy given lack of IV contrast. No bulky hilar adenopathy. The esophagus is decompressed. Lungs/Pleura: Mild emphysema. Central bronchial thickening with mucus/debris in the left mainstem and lower lobe bronchus. Short-segment mucous plugging/bronchial occlusion in the left lower lobe. Patchy ground-glass left lower lobe opacity, partially obscured by breathing motion. There is mild smooth septal thickening suggesting pulmonary edema. Biapical pleuroparenchymal scarring. There are multiple small pulmonary nodules, some of which are calcified. The largest measures 8 mm in the superior segments of the left lower lobe image 35 series 4. This is unchanged dating back to 2015 and considered benign. No new pulmonary nodule or mass. No pleural effusion. Upper Abdomen: Simple cyst in the left lobe of the liver. No acute finding. Musculoskeletal: There are no acute or suspicious osseous abnormalities. IMPRESSION: 1. Mild emphysema with bronchial thickening and scattered debris/mucus in the left lower lobe bronchus. Areas of mucous plugging/bronchial occlusion in the left lower lobe. Patchy left lower lobe ground-glass opacities may be postobstructive atelectasis or pneumonia. 2. Smooth septal thickening consistent with pulmonary edema. 3. Benign pulmonary  nodules, stable dating back to 2015 CT. Biapical pleuroparenchymal scarring is unchanged. 4. No pleural effusion. Aortic Atherosclerosis (ICD10-I70.0) and Emphysema (ICD10-J43.9). Electronically Signed   By: Jeb Levering M.D.   On: 08/07/2017 02:47   Ct Chest W Contrast  Result Date: 08/12/2017 CLINICAL DATA:  Follow-up left lower lobe infiltrate EXAM: CT CHEST WITH CONTRAST TECHNIQUE: Multidetector CT imaging of the chest was performed during intravenous contrast administration. CONTRAST:  33m ISOVUE-300 IOPAMIDOL (ISOVUE-300) INJECTION 61% COMPARISON:  08/07/2017 FINDINGS: Cardiovascular: Thoracic aorta demonstrates atherosclerotic changes without aneurysmal dilatation or dissection. No cardiac enlargement is seen. Heavy coronary calcifications are noted. Pulmonary artery is well visualized without filling defect to suggest pulmonary embolism. Mediastinum/Nodes: Thoracic inlet is within normal limits. No significant hilar or mediastinal adenopathy is noted. The esophagus is unremarkable. Lungs/Pleura: Biapical scarring is noted and stable. Focal infiltrate is noted in the lateral aspect of the right upper lobe inferiorly new from the prior exam. Complete consolidation in the left lower lobe is noted as well. Previously seen left lower lobe nodule is obscured by the consolidation. No other nodules are seen. Mild emphysematous changes are noted. Upper Abdomen: Cyst is noted within the left lobe of the liver. No other focal abnormality is noted. Musculoskeletal: Degenerative changes are noted in the thoracic spine. No acute bony abnormality is noted. IMPRESSION: Complete consolidation of the left lower lobe new from the prior exam. Mild infiltrative changes in the lateral aspect of the right upper lobe inferiorly. Aortic Atherosclerosis (ICD10-I70.0) and Emphysema (ICD10-J43.9). Electronically Signed   By: MInez CatalinaM.D.   On: 08/12/2017 13:54   Dg Chest Port 1 View  Result Date: 08/07/2017 CLINICAL DATA:  Initial evaluation for acute respiratory distress. EXAM: PORTABLE CHEST 1 VIEW COMPARISON:  Prior radiograph from 08/03/2017. FINDINGS: Mild cardiomegaly, stable.  Mediastinal silhouette normal. Lungs normally  inflated. Underlying COPD. There is subtly increased prominence of the interstitial markings as compared to previous, suggesting pulmonary interstitial edema. No consolidative airspace disease. No pleural effusion. No pneumothorax. No acute osseous abnormality. IMPRESSION: 1. Diffuse prominence of the interstitial markings, consistent with mild diffuse pulmonary interstitial edema. 2. Underlying chronic bronchitic changes/COPD. Electronically Signed   By: BJeannine BogaM.D.   On: 08/07/2017 00:56      Assessment and Plan: Patient Active Problem List   Diagnosis Date Noted  . History of stroke 08/17/2017  . Acute respiratory failure with hypoxia and hypercarbia (HG. L. Garcia 08/07/2017  . Respiratory failure (HP[place] 08/07/2017  . History of GI bleed 07/07/2017  . Atrial fibrillation with RVR ([place] 06/30/2017  . Bilateral carotid artery disease (HL[place] 04/14/2017  . Healthcare maintenance 04/14/2017  . Dizziness 04/07/2017  . 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 TIA (transient ischemic attack) 12/10/2016  . CVA (cerebral vascular accident) ([place] 12/09/2016  . Bilateral arm pain 11/27/2016  . Myocardial infarction (HC[place] 07/30/2015  . Urinary frequency 02/09/2015  . RUQ abdominal pain 01/26/2015  . Shortness of breath 01/26/2015  . Anxiety 01/26/2015  . Chronic pruritus 12/21/2014  . Cerebral vascular accident ([place] 11/16/2014  . Chronic low back pain 11/16/2014  . Adult hypothyroidism 11/15/2014  . Allergic rhinitis 11/15/2014  . Body mass index (BMI) of 28.0-28.9 in adult 11/15/2014  . Arthritis 11/15/2014  . Cramps of lower extremity 11/15/2014  . Essential (primary) hypertension 11/15/2014  . Acid reflux 11/15/2014  . Hypercholesteremia 11/15/2014  . Cannot sleep 11/15/2014  . Psoriasis 11/15/2014  . Itch of skin 11/15/2014  . Restless leg 11/15/2014  .  Diabetes mellitus, type 2 ([place]) 11/15/2014  . Breath shortness 11/15/2014  . Dermatitis due to unknown  cause 04/10/2014  . Arteriosclerosis of coronary artery 02/20/2014  . AF (paroxysmal atrial fibrillation) ([place]) 02/20/2014  . Temporary cerebral vascular dysfunction 02/20/2014  . DD (diverticular disease) 10/05/2013    1.  atelectasis  She is doing better clinically.  I would like to have a follow-up CT scan of the chest done.  If this shows improvement and there is no rale need for any further invasive diagnostic testing.  The patient prefers that she would rather not have bronchoscopy however explain to her that if there is no improvement wean to do the bronchoscopy 2. Chronic Respiratory failure hypoxia oxygen requirements have improved will continue with supportive care and monitor saturations closely 3. Chronic atrial fibrillation  Rate is better controlled at this time we will continue with present management follow up with Cardiology 4. Shortness of breath clinically improving will continue to monitor  General Counseling: I have discussed the findings of the evaluation and examination with Peggy [name].  I have also discussed any further diagnostic evaluation thatmay be needed or ordered today. Peggy [name] verbalizes understanding of the findings of todays visit. We also reviewed her medications today and discussed drug interactions and side effects including but not limited excessive drowsiness and altered mental states. We also discussed that there is always a risk not just to her but also people around her. she has been encouraged to call the office with any questions or concerns that should arise related to todays visit.    Time spent: 61mn  I have personally obtained a history, examined the patient, evaluated laboratory and imaging results, formulated the assessment and plan and placed orders.    SAllyne Gee MD [facility]

## 2017-08-21 ENCOUNTER — Telehealth: Payer: Self-pay

## 2017-08-21 NOTE — Telephone Encounter (Signed)
Bethann Berkshire from [facility] called asking about pt stopping the eliquis 3 days before bronch.  I spoke to Tat and she advised that pt is only having a CT scan scheduled and DSK will decide on other testing. dbs

## 2017-08-24 ENCOUNTER — Telehealth: Payer: Self-pay

## 2017-08-24 NOTE — Telephone Encounter (Signed)
Left message advising daughter [name]) on her mom's Ct Chest appointment which is scheduled for 09/08/17 @ 10:00 [facility]

## 2017-09-08 ENCOUNTER — Ambulatory Visit
Admission: RE | Admit: 2017-09-08 | Discharge: 2017-09-08 | Disposition: A | Discharge status: Home / Self Care | Payer: Medicare Other | Source: Ambulatory Visit | Attending: Internal Medicine | Admitting: Internal Medicine

## 2017-09-08 ENCOUNTER — Ambulatory Visit: Payer: Medicare Other

## 2017-09-08 DIAGNOSIS — R918 Other nonspecific abnormal finding of lung field: Secondary | ICD-10-CM | POA: Diagnosis not present

## 2017-09-08 DIAGNOSIS — R911 Solitary pulmonary nodule: Secondary | ICD-10-CM | POA: Insufficient documentation

## 2017-09-08 DIAGNOSIS — I2510 Atherosclerotic heart disease of native coronary artery without angina pectoris: Secondary | ICD-10-CM | POA: Diagnosis not present

## 2017-09-08 DIAGNOSIS — J9811 Atelectasis: Secondary | ICD-10-CM | POA: Diagnosis not present

## 2017-09-08 DIAGNOSIS — I700 Atherosclerosis of aorta: Secondary | ICD-10-CM | POA: Diagnosis not present

## 2017-09-08 MED ORDER — IOPAMIDOL (ISOVUE-300) INJECTION 61%
75.0000 mL | Freq: Once | INTRAVENOUS | Status: AC | PRN
  Administered 2017-09-08: 75 mL via INTRAVENOUS

## 2017-09-08 MED ORDER — IOHEXOL 300 MG/ML  SOLN: 75.0000 mL | Freq: Once | INTRAMUSCULAR | Status: DC | PRN

## 2017-09-24 ENCOUNTER — Inpatient Hospital Stay: Payer: Medicare Other

## 2017-09-24 ENCOUNTER — Encounter: Payer: Self-pay | Admitting: Internal Medicine

## 2017-09-24 ENCOUNTER — Other Ambulatory Visit: Payer: Self-pay

## 2017-09-24 ENCOUNTER — Ambulatory Visit: Payer: Medicare Other | Admitting: Internal Medicine

## 2017-09-24 ENCOUNTER — Inpatient Hospital Stay
Admission: AD | Admit: 2017-09-24 | Discharge: 2017-09-26 | DRG: 190 | Disposition: A | Discharge status: Home / Self Care | Payer: Medicare Other | Source: Ambulatory Visit | Attending: Internal Medicine | Admitting: Internal Medicine

## 2017-09-24 VITALS — BP 142/78 | HR 73 | Resp 22 | Ht 62.0 in | Wt 154.6 lb

## 2017-09-24 DIAGNOSIS — Z888 Allergy status to other drugs, medicaments and biological substances status: Secondary | ICD-10-CM | POA: Diagnosis not present

## 2017-09-24 DIAGNOSIS — J9621 Acute and chronic respiratory failure with hypoxia: Secondary | ICD-10-CM | POA: Diagnosis present

## 2017-09-24 DIAGNOSIS — I482 Chronic atrial fibrillation, unspecified: Secondary | ICD-10-CM

## 2017-09-24 DIAGNOSIS — R0602 Shortness of breath: Secondary | ICD-10-CM | POA: Diagnosis present

## 2017-09-24 DIAGNOSIS — Z8673 Personal history of transient ischemic attack (TIA), and cerebral infarction without residual deficits: Secondary | ICD-10-CM

## 2017-09-24 DIAGNOSIS — J9611 Chronic respiratory failure with hypoxia: Secondary | ICD-10-CM | POA: Diagnosis not present

## 2017-09-24 DIAGNOSIS — J679 Hypersensitivity pneumonitis due to unspecified organic dust: Secondary | ICD-10-CM | POA: Diagnosis present

## 2017-09-24 DIAGNOSIS — J8410 Pulmonary fibrosis, unspecified: Secondary | ICD-10-CM | POA: Diagnosis present

## 2017-09-24 DIAGNOSIS — Z79899 Other long term (current) drug therapy: Secondary | ICD-10-CM

## 2017-09-24 DIAGNOSIS — Z955 Presence of coronary angioplasty implant and graft: Secondary | ICD-10-CM

## 2017-09-24 DIAGNOSIS — Z87891 Personal history of nicotine dependence: Secondary | ICD-10-CM | POA: Diagnosis not present

## 2017-09-24 DIAGNOSIS — Z881 Allergy status to other antibiotic agents status: Secondary | ICD-10-CM

## 2017-09-24 DIAGNOSIS — Z794 Long term (current) use of insulin: Secondary | ICD-10-CM | POA: Diagnosis not present

## 2017-09-24 DIAGNOSIS — M1990 Unspecified osteoarthritis, unspecified site: Secondary | ICD-10-CM | POA: Diagnosis present

## 2017-09-24 DIAGNOSIS — Z880 Allergy status to penicillin: Secondary | ICD-10-CM

## 2017-09-24 DIAGNOSIS — I10 Essential (primary) hypertension: Secondary | ICD-10-CM | POA: Diagnosis present

## 2017-09-24 DIAGNOSIS — E785 Hyperlipidemia, unspecified: Secondary | ICD-10-CM | POA: Diagnosis present

## 2017-09-24 DIAGNOSIS — J441 Chronic obstructive pulmonary disease with (acute) exacerbation: Principal | ICD-10-CM | POA: Diagnosis present

## 2017-09-24 DIAGNOSIS — Z7901 Long term (current) use of anticoagulants: Secondary | ICD-10-CM | POA: Diagnosis not present

## 2017-09-24 DIAGNOSIS — J9811 Atelectasis: Secondary | ICD-10-CM

## 2017-09-24 DIAGNOSIS — E119 Type 2 diabetes mellitus without complications: Secondary | ICD-10-CM | POA: Diagnosis present

## 2017-09-24 DIAGNOSIS — K219 Gastro-esophageal reflux disease without esophagitis: Secondary | ICD-10-CM | POA: Diagnosis present

## 2017-09-24 DIAGNOSIS — I4891 Unspecified atrial fibrillation: Secondary | ICD-10-CM | POA: Diagnosis present

## 2017-09-24 LAB — CBC WITH DIFFERENTIAL/PLATELET
BASOS PCT: 1 %
Basophils Absolute: 0.1 10*3/uL (ref 0–0.1)
EOS ABS: 1.6 10*3/uL — AB (ref 0–0.7)
Eosinophils Relative: 14 %
HCT: 36.4 % (ref 35.0–47.0)
HEMOGLOBIN: 11.8 g/dL — AB (ref 12.0–16.0)
Lymphocytes Relative: 12 %
Lymphs Abs: 1.4 10*3/uL (ref 1.0–3.6)
MCH: 27.1 pg (ref 26.0–34.0)
MCHC: 32.3 g/dL (ref 32.0–36.0)
MCV: 83.8 fL (ref 80.0–100.0)
MONO ABS: 0.8 10*3/uL (ref 0.2–0.9)
MONOS PCT: 7 %
NEUTROS PCT: 66 %
Neutro Abs: 7.2 10*3/uL — ABNORMAL HIGH (ref 1.4–6.5)
Platelets: 301 10*3/uL (ref 150–440)
RBC: 4.34 MIL/uL (ref 3.80–5.20)
RDW: 17.8 % — ABNORMAL HIGH (ref 11.5–14.5)
WBC: 11.1 10*3/uL — ABNORMAL HIGH (ref 3.6–11.0)

## 2017-09-24 LAB — BLOOD GAS, ARTERIAL
Acid-Base Excess: 0.5 mmol/L (ref 0.0–2.0)
Bicarbonate: 25.4 mmol/L (ref 20.0–28.0)
FIO2: 0.28
O2 Saturation: 97.7 %
PCO2 ART: 41 mmHg (ref 32.0–48.0)
PH ART: 7.4 (ref 7.350–7.450)
PO2 ART: 99 mmHg (ref 83.0–108.0)
Patient temperature: 37

## 2017-09-24 LAB — BASIC METABOLIC PANEL
Anion gap: 10 (ref 5–15)
BUN: 19 mg/dL (ref 6–20)
CHLORIDE: 106 mmol/L (ref 101–111)
CO2: 23 mmol/L (ref 22–32)
CREATININE: 0.98 mg/dL (ref 0.44–1.00)
Calcium: 8.9 mg/dL (ref 8.9–10.3)
GFR calc non Af Amer: 51 mL/min — ABNORMAL LOW (ref >60)
GFR, EST AFRICAN AMERICAN: 59 mL/min — AB (ref >60)
Glucose, Bld: 161 mg/dL — ABNORMAL HIGH (ref 65–99)
Potassium: 4.3 mmol/L (ref 3.5–5.1)
SODIUM: 139 mmol/L (ref 135–145)

## 2017-09-24 LAB — PROCALCITONIN

## 2017-09-24 LAB — GLUCOSE, CAPILLARY
Glucose-Capillary: 132 mg/dL — ABNORMAL HIGH (ref 65–99)
Glucose-Capillary: 152 mg/dL — ABNORMAL HIGH (ref 65–99)

## 2017-09-24 MED ORDER — SODIUM CHLORIDE 0.9 % IV SOLN: 250.0000 mL | INTRAVENOUS | Status: DC | PRN

## 2017-09-24 MED ORDER — RISAQUAD PO CAPS
1.0000 | ORAL_CAPSULE | Freq: Every day | ORAL | Status: DC
  Administered 2017-09-24 – 2017-09-26 (×3): 1 via ORAL
  Filled 2017-09-24 (×3): qty 1

## 2017-09-24 MED ORDER — BUDESONIDE 0.25 MG/2ML IN SUSP
0.2500 mg | Freq: Two times a day (BID) | RESPIRATORY_TRACT | Status: DC
  Administered 2017-09-24 – 2017-09-25 (×3): 0.25 mg via RESPIRATORY_TRACT
  Filled 2017-09-24 (×3): qty 2

## 2017-09-24 MED ORDER — VITAMIN B-12 1000 MCG PO TABS
1000.0000 ug | ORAL_TABLET | Freq: Every day | ORAL | Status: DC
  Administered 2017-09-24 – 2017-09-26 (×3): 1000 ug via ORAL
  Filled 2017-09-24 (×3): qty 1

## 2017-09-24 MED ORDER — FLUTICASONE PROPIONATE 50 MCG/ACT NA SUSP
2.0000 | Freq: Every day | NASAL | Status: DC
  Administered 2017-09-24 – 2017-09-26 (×3): 2 via NASAL
  Filled 2017-09-24: qty 16

## 2017-09-24 MED ORDER — APIXABAN 2.5 MG PO TABS
2.5000 mg | ORAL_TABLET | Freq: Two times a day (BID) | ORAL | Status: DC
  Administered 2017-09-24 – 2017-09-25 (×3): 2.5 mg via ORAL
  Filled 2017-09-24 (×3): qty 1

## 2017-09-24 MED ORDER — AMIODARONE HCL 200 MG PO TABS
200.0000 mg | ORAL_TABLET | Freq: Every day | ORAL | Status: DC
  Administered 2017-09-24 – 2017-09-26 (×3): 200 mg via ORAL
  Filled 2017-09-24 (×3): qty 1

## 2017-09-24 MED ORDER — IPRATROPIUM-ALBUTEROL 0.5-2.5 (3) MG/3ML IN SOLN
3.0000 mL | RESPIRATORY_TRACT | Status: DC
  Administered 2017-09-24 – 2017-09-26 (×12): 3 mL via RESPIRATORY_TRACT
  Filled 2017-09-24 (×10): qty 3

## 2017-09-24 MED ORDER — INSULIN ASPART 100 UNIT/ML ~~LOC~~ SOLN
0.0000 [IU] | Freq: Three times a day (TID) | SUBCUTANEOUS | Status: DC
  Administered 2017-09-24: 1 [IU] via SUBCUTANEOUS
  Administered 2017-09-25: 9 [IU] via SUBCUTANEOUS
  Administered 2017-09-25: 3 [IU] via SUBCUTANEOUS
  Filled 2017-09-24 (×3): qty 1

## 2017-09-24 MED ORDER — ACETAMINOPHEN 650 MG RE SUPP: 650.0000 mg | Freq: Four times a day (QID) | RECTAL | Status: DC | PRN

## 2017-09-24 MED ORDER — GUAIFENESIN ER 600 MG PO TB12: 600.0000 mg | ORAL_TABLET | Freq: Two times a day (BID) | ORAL | Status: DC

## 2017-09-24 MED ORDER — ATORVASTATIN CALCIUM 20 MG PO TABS
40.0000 mg | ORAL_TABLET | Freq: Every day | ORAL | Status: DC
  Administered 2017-09-24 – 2017-09-25 (×2): 40 mg via ORAL
  Filled 2017-09-24 (×2): qty 2

## 2017-09-24 MED ORDER — INSULIN ASPART PROT & ASPART (70-30 MIX) 100 UNIT/ML ~~LOC~~ SUSP
18.0000 [IU] | Freq: Two times a day (BID) | SUBCUTANEOUS | Status: DC
  Administered 2017-09-24 – 2017-09-25 (×3): 18 [IU] via SUBCUTANEOUS
  Filled 2017-09-24 (×4): qty 1

## 2017-09-24 MED ORDER — IPRATROPIUM-ALBUTEROL 0.5-2.5 (3) MG/3ML IN SOLN
3.0000 mL | RESPIRATORY_TRACT | Status: DC
  Administered 2017-09-24: 3 mL via RESPIRATORY_TRACT

## 2017-09-24 MED ORDER — GUAIFENESIN-DM 100-10 MG/5ML PO SYRP
15.0000 mL | ORAL_SOLUTION | ORAL | Status: DC | PRN
  Administered 2017-09-24 – 2017-09-26 (×4): 15 mL via ORAL
  Filled 2017-09-24 (×5): qty 15

## 2017-09-24 MED ORDER — HYDROXYZINE HCL 25 MG PO TABS
25.0000 mg | ORAL_TABLET | Freq: Four times a day (QID) | ORAL | Status: DC | PRN
  Administered 2017-09-24 (×2): 25 mg via ORAL
  Filled 2017-09-24 (×2): qty 1

## 2017-09-24 MED ORDER — ACETAMINOPHEN 325 MG PO TABS: 650.0000 mg | ORAL_TABLET | Freq: Four times a day (QID) | ORAL | Status: DC | PRN

## 2017-09-24 MED ORDER — ACETYLCYSTEINE 20 % IN SOLN
600.0000 mg | Freq: Two times a day (BID) | RESPIRATORY_TRACT | Status: DC
  Filled 2017-09-24 (×3): qty 4

## 2017-09-24 MED ORDER — GUAIFENESIN ER 600 MG PO TB12
600.0000 mg | ORAL_TABLET | Freq: Two times a day (BID) | ORAL | Status: DC
  Administered 2017-09-24 – 2017-09-26 (×5): 600 mg via ORAL
  Filled 2017-09-24 (×5): qty 1

## 2017-09-24 MED ORDER — ONDANSETRON HCL 4 MG/2ML IJ SOLN: 4.0000 mg | Freq: Four times a day (QID) | INTRAMUSCULAR | Status: DC | PRN

## 2017-09-24 MED ORDER — ORAL CARE MOUTH RINSE
15.0000 mL | Freq: Two times a day (BID) | OROMUCOSAL | Status: DC
  Administered 2017-09-24 – 2017-09-25 (×4): 15 mL via OROMUCOSAL

## 2017-09-24 MED ORDER — LEVOTHYROXINE SODIUM 50 MCG PO TABS
50.0000 ug | ORAL_TABLET | Freq: Every day | ORAL | Status: DC
  Administered 2017-09-25 – 2017-09-26 (×2): 50 ug via ORAL
  Filled 2017-09-24 (×2): qty 1

## 2017-09-24 MED ORDER — FENOFIBRATE 54 MG PO TABS
54.0000 mg | ORAL_TABLET | Freq: Every day | ORAL | Status: DC
  Administered 2017-09-24 – 2017-09-26 (×3): 54 mg via ORAL
  Filled 2017-09-24 (×3): qty 1

## 2017-09-24 MED ORDER — ONDANSETRON HCL 4 MG PO TABS: 4.0000 mg | ORAL_TABLET | Freq: Four times a day (QID) | ORAL | Status: DC | PRN

## 2017-09-24 MED ORDER — METHYLPREDNISOLONE SODIUM SUCC 125 MG IJ SOLR
60.0000 mg | Freq: Four times a day (QID) | INTRAMUSCULAR | Status: DC
  Administered 2017-09-24 – 2017-09-25 (×4): 60 mg via INTRAVENOUS
  Filled 2017-09-24 (×4): qty 2

## 2017-09-24 MED ORDER — SODIUM CHLORIDE 0.9% FLUSH
3.0000 mL | Freq: Two times a day (BID) | INTRAVENOUS | Status: DC
  Administered 2017-09-24 – 2017-09-26 (×5): 3 mL via INTRAVENOUS

## 2017-09-24 MED ORDER — HYDROCODONE-ACETAMINOPHEN 5-325 MG PO TABS
1.0000 | ORAL_TABLET | Freq: Two times a day (BID) | ORAL | Status: DC | PRN
  Administered 2017-09-26: 1 via ORAL
  Filled 2017-09-24 (×2): qty 1

## 2017-09-24 MED ORDER — SODIUM CHLORIDE 0.9% FLUSH: 3.0000 mL | INTRAVENOUS | Status: DC | PRN

## 2017-09-24 MED ORDER — BENZONATATE 100 MG PO CAPS
200.0000 mg | ORAL_CAPSULE | Freq: Three times a day (TID) | ORAL | Status: DC
  Administered 2017-09-25 – 2017-09-26 (×4): 200 mg via ORAL
  Filled 2017-09-24 (×5): qty 2

## 2017-09-24 MED ORDER — VITAMIN D 1000 UNITS PO TABS
5000.0000 [IU] | ORAL_TABLET | Freq: Every day | ORAL | Status: DC
  Administered 2017-09-24 – 2017-09-26 (×3): 5000 [IU] via ORAL
  Filled 2017-09-24 (×3): qty 5

## 2017-09-24 MED ORDER — GUAIFENESIN-CODEINE 100-10 MG/5ML PO SOLN
10.0000 mL | Freq: Four times a day (QID) | ORAL | Status: DC | PRN
  Administered 2017-09-24 – 2017-09-26 (×5): 10 mL via ORAL
  Filled 2017-09-24 (×5): qty 10

## 2017-09-24 NOTE — Progress Notes (Signed)
[facility] - [place] at [facility]                                                                                                                                                                                  Patient Demographics   Peggy [name], is a 84 y.o. female, DOB - 06/12/1928, FUX:[id number]  Admit date - 09/24/2017   Admitting Physician Enedina Finner, MD  Outpatient Primary MD for the patient is Lauro Regulus, MD   LOS - 0  Subjective: Called by nurse due to patient having worsening shortness of breath patient states that she is more short of breath    Review of Systems:   CONSTITUTIONAL: No documented fever. No fatigue, weakness. No weight gain, no weight loss.  EYES: No blurry or double vision.  ENT: No tinnitus. No postnasal drip. No redness of the oropharynx.  RESPIRATORY: No cough, positive wheeze, no hemoptysis.  Positive dyspnea.  CARDIOVASCULAR: No chest pain. No orthopnea. No palpitations. No syncope.  GASTROINTESTINAL: No nausea, no vomiting or diarrhea. No abdominal pain. No melena or hematochezia.  GENITOURINARY: No dysuria or hematuria.  ENDOCRINE: No polyuria or nocturia. No heat or cold intolerance.  HEMATOLOGY: No anemia. No bruising. No bleeding.  INTEGUMENTARY: No rashes. No lesions.  MUSCULOSKELETAL: No arthritis. No swelling. No gout.  NEUROLOGIC: No numbness, tingling, or ataxia. No seizure-type activity.  PSYCHIATRIC: No anxiety. No insomnia. No ADD.    Vitals:   Vitals:   09/24/17 1307 09/24/17 1356 09/24/17 1401  BP: (!) 178/74    Pulse: 99    Resp: (!) 36    Temp: 97.7 F (36.5 C)    TempSrc: Oral    SpO2: 100% 92% 99%    Wt Readings from Last 3 Encounters:  09/24/17 70.1 kg (154 lb 9.6 oz)  08/17/17 69.8 kg (153 lb 12.8 oz)  08/14/17 69.5 kg (153 lb 4.8 oz)     Intake/Output Summary (Last 24 hours) at 09/24/2017 1654 Last data filed at 09/24/2017 1348 Gross per 24 hour  Intake -  Output 150 ml  Net -150 ml     Physical Exam:   GENERAL: Pleasant-appearing in no apparent distress.  HEAD, EYES, EARS, NOSE AND THROAT: Atraumatic, normocephalic. Extraocular muscles are intact. Pupils equal and reactive to light. Sclerae anicteric. No conjunctival injection. No oro-pharyngeal erythema.  NECK: Supple. There is no jugular venous distention. No bruits, no lymphadenopathy, no thyromegaly.  HEART: Regular rate and rhythm,. No murmurs, no rubs, no clicks.  LUNGS: Mild tachypnea, improved wheezing ABDOMEN: Soft, flat, nontender, nondistended. Has good bowel sounds. No hepatosplenomegaly appreciated.  EXTREMITIES: No evidence of any cyanosis, clubbing, or peripheral edema.  +2  pedal and radial pulses bilaterally.  NEUROLOGIC: The patient is alert, awake, and oriented x3 with no focal motor or sensory deficits appreciated bilaterally.  SKIN: Moist and warm with no rashes appreciated.  Psych: Not anxious, depressed LN: No inguinal LN enlargement    Antibiotics   Anti-infectives (From admission, onward)   None      Medications   Scheduled Meds: . acetylcysteine  600 mg Oral BID  . acidophilus  1 capsule Oral Daily  . amiodarone  200 mg Oral Daily  . apixaban  2.5 mg Oral BID  . atorvastatin  40 mg Oral q1800  . benzonatate  200 mg Oral TID  . budesonide (PULMICORT) nebulizer solution  0.25 mg Nebulization BID  . cholecalciferol  5,000 Units Oral Daily  . fenofibrate  54 mg Oral Daily  . fluticasone  2 spray Each Nare Daily  . guaiFENesin  600 mg Oral BID  . insulin aspart  0-9 Units Subcutaneous TID WC  . insulin aspart protamine- aspart  18 Units Subcutaneous BID  . ipratropium-albuterol  3 mL Nebulization Q4H  . [START ON 09/25/2017] levothyroxine  50 mcg Oral QAC breakfast  . mouth rinse  15 mL Mouth Rinse BID  . methylPREDNISolone (SOLU-MEDROL) injection  60 mg Intravenous Q6H  . sodium chloride flush  3 mL Intravenous Q12H  . vitamin B-12  1,000 mcg Oral Daily   Continuous  Infusions: . sodium chloride     PRN Meds:.sodium chloride, acetaminophen **OR** acetaminophen, guaiFENesin-codeine, guaiFENesin-dextromethorphan, HYDROcodone-acetaminophen, hydrOXYzine, ondansetron **OR** ondansetron (ZOFRAN) IV, sodium chloride flush   Data Review:   Micro Results No results found for this or any previous visit (from the past 240 hour(s)).  Radiology Reports Dg Chest 2 View  Result Date: 09/24/2017 CLINICAL DATA:  Shortness of breath. EXAM: CHEST - 2 VIEW COMPARISON:  CT 09/08/2017.  Chest x-ray 08/09/2017. FINDINGS: Mediastinum and hilar structures normal. Cardiomegaly with normal pulmonary vascularity. No focal infiltrate. Bilateral apical pleural-parenchymal thickening noted consistent with scarring. Previous identified left lower lobe pulmonary nodule best identified by prior CT. IMPRESSION: 1.  Cardiomegaly.  No pulmonary venous congestion. 2. No focal pulmonary infiltrate. Previously noted pulmonary nodule left lower lobe best identified by prior CT Electronically Signed   By: Maisie Fus  Register   On: 09/24/2017 14:42   Ct Chest W Contrast  Result Date: 09/08/2017 CLINICAL DATA:  84 year old female with difficulty breathing worse when lying down. Subsequent encounter. EXAM: CT CHEST WITH CONTRAST TECHNIQUE: Multidetector CT imaging of the chest was performed during intravenous contrast administration. CONTRAST:  75mL ISOVUE-300 IOPAMIDOL (ISOVUE-300) INJECTION 61% COMPARISON:  08/12/2017, 08/07/2017 and 10/07/2013 chest CT. FINDINGS: Cardiovascular: No central pulmonary embolus. Prominent main pulmonary arteries greater on the right. Question component of pulmonary hypertension. Atherosclerotic changes thoracic aorta without aneurysm or dissection. Heart size top-normal.  Prominent coronary artery calcifications. Mediastinum/Nodes: Scattered normal/top-normal size mediastinal lymph nodes. Lungs/Pleura: Interval clearing of left lower lobe collapse/atelectasis. Fluctuating  ground-glass opacities now most notable within the superior aspect of the right lower lobe and the superior aspect of right upper lobe. Question possibility of hypersensitivity pneumonitis. 8 mm pulmonary nodule superior aspect left lower lobe unchanged from 2015 and therefore most likely benign. Upper Abdomen: No worrisome abnormality. Musculoskeletal: No worrisome osseous lesion. IMPRESSION: Interval clearing of left lower lobe collapse/atelectasis. Fluctuating ground-glass opacities now most notable within the superior aspect of the right lower lobe and the superior aspect of right upper lobe. Question possibility of hypersensitivity pneumonitis. 8 mm pulmonary nodule superior aspect left lower  lobe unchanged from 2015 and therefore most likely benign. No central pulmonary embolus. Prominence of right and left main pulmonary artery raise possibility of component of underlying pulmonary hypertension. Prominent coronary artery calcifications. Aortic Atherosclerosis (ICD10-I70.0). Electronically Signed   By: Lacy Duverney M.D.   On: 09/08/2017 19:25     CBC Recent Labs  Lab 09/24/17 1400  WBC 11.1*  HGB 11.8*  HCT 36.4  PLT 301  MCV 83.8  MCH 27.1  MCHC 32.3  RDW 17.8*  LYMPHSABS 1.4  MONOABS 0.8  EOSABS 1.6*  BASOSABS 0.1    Chemistries  Recent Labs  Lab 09/24/17 1400  NA 139  K 4.3  CL 106  CO2 23  GLUCOSE 161*  BUN 19  CREATININE 0.98  CALCIUM 8.9   ------------------------------------------------------------------------------------------------------------------ estimated creatinine clearance is 37.8 mL/min (by C-G formula based on SCr of 0.98 mg/dL). ------------------------------------------------------------------------------------------------------------------ No results for input(s): HGBA1C in the last 72 hours. ------------------------------------------------------------------------------------------------------------------ No results for input(s): CHOL, HDL, LDLCALC,  TRIG, CHOLHDL, LDLDIRECT in the last 72 hours. ------------------------------------------------------------------------------------------------------------------ No results for input(s): TSH, T4TOTAL, T3FREE, THYROIDAB in the last 72 hours.  Invalid input(s): FREET3 ------------------------------------------------------------------------------------------------------------------ No results for input(s): VITAMINB12, FOLATE, FERRITIN, TIBC, IRON, RETICCTPCT in the last 72 hours.  Coagulation profile No results for input(s): INR, PROTIME in the last 168 hours.  No results for input(s): DDIMER in the last 72 hours.  Cardiac Enzymes No results for input(s): CKMB, TROPONINI, MYOGLOBIN in the last 168 hours.  Invalid input(s): CK ------------------------------------------------------------------------------------------------------------------ Invalid input(s): POCBNP    Assessment & Plan   Patient is a 84 year old   admitted with acute respiratory failure now worsening respiratory status   1.  Acute hypoxic respiratory failure I reviewed patient's chest x-ray which shows no pneumonia or any other abnormality We will place patient on high flow nasal cannula Continue Solu-Medrol Procalcitonin levels reviewed there are normal Hold off on antibiotic We will check a stat ABG, if continues to be very dyspneic may need transfer to the ICU and placed on BiPAP Case reviewed with the patient and her daughter     Code Status Orders  (From admission, onward)        Start     Ordered   09/24/17 1405  Full code  Continuous     09/24/17 1404    Code Status History    Date Active Date Inactive Code Status Order ID Comments User Context   08/07/2017 1223 08/14/2017 1551 Full Code [id number]  Arnaldo Natal, MD Inpatient   07/01/2017 1247 07/04/2017 1442 Full Code [id number]  Milagros Loll, MD Inpatient   06/30/2017 2100 07/01/2017 1247 Partial Code [id number]  Altamese Dilling, MD  Inpatient   12/09/2016 2128 12/10/2016 2112 DNR [id number]  Milagros Loll, MD ED    Advance Directive Documentation     Most Recent Value  Type of Advance Directive  Living will  Pre-existing out of facility DNR order (yellow form or pink MOST form)  -  "MOST" Form in Place?  -           Consults pulmonary   DVT Prophylaxis  Lovenox   Lab Results  Component Value Date   PLT 301 09/24/2017     Time Spent in minutes   45 minutes critical care time greater than 50% of time spent in care coordination and counseling patient regarding the condition and plan of care.   Auburn Bilberry M.D on 09/24/2017 at 4:54 PM  Between 7am to 6pm - Pager - [phone]  After  6pm go to [email or web address] - Proofreader  [employer]   Office  [phone]

## 2017-09-24 NOTE — H&P (Signed)
[employer] - [place] at [facility]   PATIENT NAME: Peggy [name]    MR#:  [id number]  DATE OF BIRTH:  17-Jul-1928  DATE OF ADMISSION:  09/24/2017  PRIMARY CARE PHYSICIAN: Lauro Regulus, MD   REQUESTING/REFERRING PHYSICIAN: Dr. Nickolas Madrid MD  CHIEF COMPLAINT:  No chief complaint on file.   HISTORY OF PRESENT ILLNESS: Peggy [name]  is a 84 y.o. female with a known history of atrial fibrillation, seasonal allergies, osteoarthritis, diabetes type 2, GERD, hypertension, hyperlipidemia who was hospitalized here in March with acute respiratory failure with hypoxia and hypercarbia with COPD exacerbation and community-acquired pneumonia.  Patient was treated with antibiotics steroids.  She had a CT scan which showed mucous plugging in the lungs.  She was followed up by pulmonary and has been treated with antibiotics for 1 month.  Who went to follow-up with Dr.Sadat Welton Flakes who saw her in the office and referred her for admission.  Patient complains of worsening shortness of breath since the past 1 week with coughing wheezing.  She has not had any fevers or chills.  No chest pain or palpitations.   PAST MEDICAL HISTORY:   Past Medical History:  Diagnosis Date  . A-fib (HCC)   . Allergy   . Arthritis   . Diabetes mellitus without complication (HCC)   . GERD (gastroesophageal reflux disease)   . Hyperlipidemia   . Hypertension   . TIA (transient ischemic attack)     PAST SURGICAL HISTORY:  Past Surgical History:  Procedure Laterality Date  . ABDOMINAL HYSTERECTOMY  1970  . BACK SURGERY  2013  . colectomy Right 10/08/2013   Dr. Egbert Garibaldi  . CORONARY ANGIOPLASTY WITH STENT PLACEMENT      SOCIAL HISTORY:  Social History   Tobacco Use  . Smoking status: Former Smoker    Packs/day: 1.00    Years: 30.00    Pack years: 30.00    Last attempt to quit: 11/30/1999    Years since quitting: 17.8  . Smokeless tobacco: Never Used  Substance Use Topics  . Alcohol use: No    FAMILY  HISTORY:  Family History  Problem Relation Age of Onset  . Breast cancer Mother   . Pancreatitis Father   . Breast cancer Sister   . Lung cancer Brother   . Melanoma Brother   . Throat cancer Brother     DRUG ALLERGIES:  Allergies  Allergen Reactions  . Diphenhydramine Rash    AGITATION/DELIRIUM  . Gabapentin Itching    And rash  . Metformin Diarrhea  . Oxybutynin Other (See Comments)    dizzy  . Amlodipine Rash  . Ciprofloxacin Rash  . Lisinopril Rash  . Penicillins Rash    Family states was a "long time ago"  . Saxagliptin Rash    REVIEW OF SYSTEMS:   CONSTITUTIONAL: No fever, fatigue or weakness.  EYES: No blurred or double vision.  EARS, NOSE, AND THROAT: No tinnitus or ear pain.  RESPIRATORY: Positive cough, positive shortness of breath, positive wheezing or no hemoptysis.  CARDIOVASCULAR: No chest pain, orthopnea, edema.  GASTROINTESTINAL: No nausea, vomiting, diarrhea or abdominal pain.  GENITOURINARY: No dysuria, hematuria.  ENDOCRINE: No polyuria, nocturia,  HEMATOLOGY: No anemia, easy bruising or bleeding SKIN: No rash or lesion. MUSCULOSKELETAL: No joint pain or arthritis.   NEUROLOGIC: No tingling, numbness, weakness.  PSYCHIATRY: No anxiety or depression.   MEDICATIONS AT HOME:  Prior to Admission medications   Medication Sig Start Date End Date Taking? Authorizing Provider  amiodarone (PACERONE) 400 MG tablet Take 0.5 tablets (200 mg total) by mouth 2 (two) times daily. Patient taking differently: Take 200 mg by mouth daily.  07/03/17   Milagros Loll, MD  apixaban (ELIQUIS) 2.5 MG TABS tablet Take 1 tablet (2.5 mg total) by mouth 2 (two) times daily. 07/04/17   Salary, Evelena Asa, MD  atorvastatin (LIPITOR) 40 MG tablet Take 1 tablet (40 mg total) by mouth daily at 6 PM. 12/10/16   Alford Highland, MD  benzonatate (TESSALON) 200 MG capsule Take 1 capsule (200 mg total) by mouth 3 (three) times daily. 08/14/17   Auburn Bilberry, MD  Cholecalciferol  (VITAMIN D3) 5000 units TABS Take 5,000 Units by mouth daily.    [provider]  fenofibrate (TRICOR) 145 MG tablet Take 1 tablet (145 mg total) by mouth daily. 09/10/15   Lorie Phenix, MD  fluticasone [facility]) 50 MCG/ACT nasal spray USE ONE SPRAY IN EACH NOSTRIL DAILY 09/27/15   Lorie Phenix, MD  guaiFENesin-codeine 100-10 MG/5ML syrup Take 10 mLs by mouth every 6 (six) hours as needed for cough. 08/14/17   Auburn Bilberry, MD  HUMULIN 70/30 KWIKPEN (70-30) 100 UNIT/ML PEN Inject 18 Units into the skin 2 (two) times daily.  06/11/17   [provider]  HYDROcodone-acetaminophen (NORCO/VICODIN) 5-325 MG tablet Take 1 tablet by mouth 2 (two) times daily. As needed for chronic low back pain 07/18/15   Lorie Phenix, MD  hydrOXYzine (ATARAX/VISTARIL) 25 MG tablet Take 25 mg by mouth 4 (four) times daily as needed for itching. 06/05/17   [provider]  ipratropium-albuterol (DUONEB) 0.5-2.5 (3) MG/3ML SOLN Take 3 mLs by nebulization every 6 (six) hours as needed. 08/14/17   Auburn Bilberry, MD  lactobacillus acidophilus (BACID) TABS tablet Take 2 tablets by mouth daily.    [provider]  levothyroxine (SYNTHROID, LEVOTHROID) 50 MCG tablet Take 1 tablet (50 mcg total) by mouth daily. PATIENT NEEDS TO SCHEDULE OFFICE VISIT FOR FOLLOW UP 02/01/16   Malva Limes, MD  metoprolol tartrate (LOPRESSOR) 100 MG tablet Take 1 tablet (100 mg total) by mouth 2 (two) times daily. 07/03/17   Milagros Loll, MD  Potassium 99 MG TABS Take 1 tablet by mouth daily.    [provider]  predniSONE (STERAPRED UNI-PAK 21 TAB) 10 MG (21) TBPK tablet Start at 60mg  taper by 10mg  until complete 08/14/17   Auburn Bilberry, MD  triamcinolone cream (KENALOG) 0.1 % APPLY AFFECTED RASH TWICE DAILY UNTIL CLEAR. 03/26/17   [provider]  VENTOLIN HFA 108 (90 Base) MCG/ACT inhaler Inhale 2 puffs into the lungs every 6 (six) hours as needed. 05/24/17   [provider]   vitamin B-12 (CYANOCOBALAMIN) 1000 MCG tablet Take 1,000 mcg by mouth daily.    [provider]      PHYSICAL EXAMINATION:   VITAL SIGNS: Blood pressure (!) 178/74, pulse 99, temperature 97.7 F (36.5 C), temperature source Oral, resp. rate (!) 36, SpO2 100 %.  GENERAL:  84 y.o.-year-old patient lying in the bed with no acute distress.  EYES: Pupils equal, round, reactive to light and accommodation. No scleral icterus. Extraocular muscles intact.  HEENT: Head atraumatic, normocephalic. Oropharynx and nasopharynx clear.  NECK:  Supple, no jugular venous distention. No thyroid enlargement, no tenderness.  LUNGS: Bilateral wheezing throughout both lungs without any accessory muscle usage CARDIOVASCULAR: S1, S2 normal. No murmurs, rubs, or gallops.  ABDOMEN: Soft, nontender, nondistended. Bowel sounds present. No organomegaly or mass.  EXTREMITIES: No pedal edema, cyanosis, or  clubbing.  NEUROLOGIC: Cranial nerves II through XII are intact. Muscle strength 5/5 in all extremities. Sensation intact. Gait not checked.  PSYCHIATRIC: The patient is alert and oriented x 3.  SKIN: No obvious rash, lesion, or ulcer.   LABORATORY PANEL:   CBC No results for input(s): WBC, HGB, HCT, PLT, MCV, MCH, MCHC, RDW, LYMPHSABS, MONOABS, EOSABS, BASOSABS, BANDABS in the last 168 hours.  Invalid input(s): NEUTRABS, BANDSABD ------------------------------------------------------------------------------------------------------------------  Chemistries  No results for input(s): NA, K, CL, CO2, GLUCOSE, BUN, CREATININE, CALCIUM, MG, AST, ALT, ALKPHOS, BILITOT in the last 168 hours.  Invalid input(s): GFRCGP ------------------------------------------------------------------------------------------------------------------ CrCl cannot be calculated (Patient's most recent lab result is older than the maximum 21 days  allowed.). ------------------------------------------------------------------------------------------------------------------ No results for input(s): TSH, T4TOTAL, T3FREE, THYROIDAB in the last 72 hours.  Invalid input(s): FREET3   Coagulation profile No results for input(s): INR, PROTIME in the last 168 hours. ------------------------------------------------------------------------------------------------------------------- No results for input(s): DDIMER in the last 72 hours. -------------------------------------------------------------------------------------------------------------------  Cardiac Enzymes No results for input(s): CKMB, TROPONINI, MYOGLOBIN in the last 168 hours.  Invalid input(s): CK ------------------------------------------------------------------------------------------------------------------ Invalid input(s): POCBNP  ---------------------------------------------------------------------------------------------------------------  Urinalysis    Component Value Date/Time   COLORURINE YELLOW (A) 06/30/2017 1652   APPEARANCEUR CLOUDY (A) 06/30/2017 1652   APPEARANCEUR Hazy 08/11/2012 1201   LABSPEC 1.018 06/30/2017 1652   LABSPEC 1.020 08/11/2012 1201   PHURINE 5.0 06/30/2017 1652   GLUCOSEU >=500 (A) 06/30/2017 1652   GLUCOSEU Negative 08/11/2012 1201   HGBUR SMALL (A) 06/30/2017 1652   BILIRUBINUR NEGATIVE 06/30/2017 1652   BILIRUBINUR neg 02/11/2015 0950   BILIRUBINUR Negative 08/11/2012 1201   KETONESUR NEGATIVE 06/30/2017 1652   PROTEINUR NEGATIVE 06/30/2017 1652   UROBILINOGEN 0.2 02/11/2015 0950   NITRITE NEGATIVE 06/30/2017 1652   LEUKOCYTESUR NEGATIVE 06/30/2017 1652   LEUKOCYTESUR 1+ 08/11/2012 1201     RADIOLOGY: No results found.  EKG: Orders placed or performed during the hospital encounter of 08/07/17  . ED EKG within 10 minutes  . ED EKG within 10 minutes  . EKG 12-Lead  . EKG 12-Lead  . EKG 12-Lead  . EKG 12-Lead  . EKG  12-Lead  . EKG 12-Lead    IMPRESSION AND PLAN: Patient is a 84 year old white female sent from the pulmonologist office for shortness of breath wheezing cough  1.  Shortness of breath likely due to acute on chronic COPD exasperation There is some concern for possible hypersensitivity pneumonitis And hypersensitivity pneumonitis panel We will treat with IV steroid She was recommended to start antibiotics however patient's daughter states that she is been already treated with 1 month of antibiotics and would like to hold if not absolutely needed I will check a procalcitonin level I will add Pulmicort to current regimen I will add Mucinex and Mucomyst to current regimen  2.  Diabetes type 2 Continue home insulin and sliding scale insulin  3.  History of atrial fibrillation continue amiodarone and Eliquis and metoprolol  4.  Hypertension continue metoprolol  5.  CODE STATUS discussed with the patient wants full code   All the records are reviewed and case discussed with ED provider. Management plans discussed with the patient, family and they are in agreement.  CODE STATUS: Code Status History    Date Active Date Inactive Code Status Order ID Comments User Context   08/07/2017 1223 08/14/2017 1551 Full Code [id number]  Arnaldo Natal, MD Inpatient   07/01/2017 1247 07/04/2017 1442 Full Code [id number]  Milagros Loll, MD Inpatient   06/30/2017 2100  07/01/2017 1247 Partial Code [id number]  Altamese Dilling, MD Inpatient   12/09/2016 2128 12/10/2016 2112 DNR [id number]  Milagros Loll, MD ED       TOTAL TIME TAKING CARE OF THIS PATIENT: 55 minutes.    Auburn Bilberry M.D on 09/24/2017 at 1:09 PM  Between 7am to 6pm - Pager - [phone]  After 6pm go to [email or web address] - password [email or web address]  [employer] Office  [phone]  CC: Primary care physician; Lauro Regulus, MD

## 2017-09-24 NOTE — Patient Instructions (Signed)
Hypoxia Hypoxia is a condition that happens when there is a lack of oxygen in the body's tissues and organs. When there is not enough oxygen, organs cannot work as they should. This causes serious problems throughout the body and in the brain. What are the causes? This condition may be caused by:  Exposure to high altitude.  A collapsed lung (pneumothorax).  Lung infection (pneumonia).  Lung injury.  Long-term (chronic) lung disease, such as COPD (chronic obstructive pulmonary disease).  Blood collecting in the chest cavity (hemothorax).  Food, saliva, or vomit getting into the airway (aspiration).  Reduced blood flow (ischemia).  Severe blood loss.  Slow or shallow breathing (hypoventilation).  Blood disorders, such as anemia.  Carbon monoxide poisoning.  The heart suddenly stopping (cardiac arrest).  Anesthetic medicines.  Drowning.  Choking.  What are the signs or symptoms? Symptoms of this condition include:  Headache.  Fatigue.  Drowsiness.  Forgetfulness.  Nausea.  Confusion.  Shortness of breath.  Dizziness.  Bluish color of the skin, lips, or nail beds (cyanosis).  Change in consciousness or awareness.  If hypoxia is not treated, it can lead to convulsions, loss of consciousness (coma), or brain damage. How is this diagnosed? This condition may be diagnosed based on:  A physical exam.  Blood tests.  A test that measures how much oxygen is in your blood (pulse oximetry). This is done with a sensor that is placed on your finger, toe, or earlobe.  Chest X-ray.  Tests to check your lung function (pulmonary function tests).  A test to check the electrical activity of your heart (electrocardiogram, ECG).  You may have other tests to determine the cause of your hypoxia. How is this treated? Treatment for this condition depends on what is causing the hypoxia. You will likely be treated with oxygen therapy. This may be done by giving you  oxygen through a face mask or through tubes in your nose. Your health care provider may also recommend other therapies to treat the underlying cause of your hypoxia. Follow these instructions at home:  Take over-the-counter and prescription medicines only as told by your health care provider.  Do not use any products that contain nicotine or tobacco, such as cigarettes and e-cigarettes. If you need help quitting, ask your health care provider.  Avoid secondhand smoke.  Work with your health care provider to manage any chronic conditions you have that may be causing hypoxia, such as COPD.  Keep all follow-up visits as told by your health care provider. This is important. Contact a health care provider if:  You have a fever.  You have trouble breathing, even after treatment.  You become extremely short of breath when you exercise. Get help right away if:  Your shortness of breath gets worse, especially with normal or very little activity.  Your skin, lips, or nail beds have a bluish color.  You become confused or you cannot think properly.  You have chest pain. Summary  Hypoxia is a condition that happens when there is a lack of oxygen in the body's tissues and organs.  If hypoxia is not treated, it can lead to convulsions, loss of consciousness (coma), or brain damage.  Symptoms of hypoxia can include a headache, shortness of breath, confusion, nausea, and a bluish skin color.  Hypoxia has many possible causes, including exposure to high altitude, carbon monoxide poisoning, or other health issues, such as blood disorders or cardiac arrest.  Hypoxia is usually treated with oxygen therapy. This information   is not intended to replace advice given to you by your health care provider. Make sure you discuss any questions you have with your health care provider. Document Released: 07/07/2016 Document Revised: 07/07/2016 Document Reviewed: 07/07/2016 [employer] Interactive Patient  Education  2018 [employer]  

## 2017-09-24 NOTE — Progress Notes (Signed)
Patient continues to be in respiratory distress while at rest.  RR = 32 - 38.  She is exerting a lot of energy to breath.  She is able to talk. O2 sats remain good.  96% after walking to the [place] and back.  Dr. Allena Katz notified.  ABG drawn and found to be WNL.  Patient started on high flow Nasal Canal.  Patient reports breathing better.

## 2017-09-24 NOTE — Progress Notes (Signed)
[facility] [place], [place] [address]  Pulmonary Sleep Medicine   Office Visit Note  Patient Name: Peggy [name] DOB: 08-09-1928 MRN [id number]  Date of Service: 09/24/2017  Complaints/HPI:  She is here for follow-up has been having increasing shortness of breath.  A CT scan that was done actually had shown resolution of left lower lobe atelectasis.  It was noted that she had some ground-glass opacities noted on the last CT.  She is now having shortness of breath cough congestion wheezing and she is not able to do much activity.  My concern is obviously that she may have an acute pneumonia going on where there may be worsening of hypersensitivity pneumonitis.  ROS  General: (-) fever, (-) chills, (-) night sweats, (-) weakness Skin: (-) rashes, (-) itching,. Eyes: (-) visual changes, (-) redness, (-) itching. Nose and Sinuses: (-) nasal stuffiness or itchiness, (-) postnasal drip, (-) nosebleeds, (-) sinus trouble. Mouth and Throat: (-) sore throat, (-) hoarseness. Neck: (-) swollen glands, (-) enlarged thyroid, (-) neck pain. Respiratory: + cough, (-) bloody sputum, + shortness of breath, + wheezing. Cardiovascular: - ankle swelling, (-) chest pain. Lymphatic: (-) lymph node enlargement. Neurologic: (-) numbness, (-) tingling. Psychiatric: (-) anxiety, (-) depression   Current Medication: Outpatient Encounter Medications as of 09/24/2017  Medication Sig  . amiodarone (PACERONE) 400 MG tablet Take 0.5 tablets (200 mg total) by mouth 2 (two) times daily. (Patient taking differently: Take 200 mg by mouth daily. )  . apixaban (ELIQUIS) 2.5 MG TABS tablet Take 1 tablet (2.5 mg total) by mouth 2 (two) times daily.  Marland Kitchen atorvastatin (LIPITOR) 40 MG tablet Take 1 tablet (40 mg total) by mouth daily at 6 PM.  . benzonatate (TESSALON) 200 MG capsule Take 1 capsule (200 mg total) by mouth 3 (three) times daily.  . Cholecalciferol (VITAMIN D3) 5000 units TABS Take  5,000 Units by mouth daily.  . fenofibrate (TRICOR) 145 MG tablet Take 1 tablet (145 mg total) by mouth daily.  . fluticasone (FLONASE) 50 MCG/ACT nasal spray USE ONE SPRAY IN EACH NOSTRIL DAILY  . guaiFENesin-codeine 100-10 MG/5ML syrup Take 10 mLs by mouth every 6 (six) hours as needed for cough.  Marland Kitchen HUMULIN 70/30 KWIKPEN (70-30) 100 UNIT/ML PEN Inject 18 Units into the skin 2 (two) times daily.   Marland Kitchen HYDROcodone-acetaminophen (NORCO/VICODIN) 5-325 MG tablet Take 1 tablet by mouth 2 (two) times daily. As needed for chronic low back pain  . hydrOXYzine (ATARAX/VISTARIL) 25 MG tablet Take 25 mg by mouth 4 (four) times daily as needed for itching.  Marland Kitchen ipratropium-albuterol (DUONEB) 0.5-2.5 (3) MG/3ML SOLN Take 3 mLs by nebulization every 6 (six) hours as needed.  . lactobacillus acidophilus (BACID) TABS tablet Take 2 tablets by mouth daily.  Marland Kitchen levothyroxine (SYNTHROID, LEVOTHROID) 50 MCG tablet Take 1 tablet (50 mcg total) by mouth daily. PATIENT NEEDS TO SCHEDULE OFFICE VISIT FOR FOLLOW UP  . metoprolol tartrate (LOPRESSOR) 100 MG tablet Take 1 tablet (100 mg total) by mouth 2 (two) times daily.  . Potassium 99 MG TABS Take 1 tablet by mouth daily.  . predniSONE (STERAPRED UNI-PAK 21 TAB) 10 MG (21) TBPK tablet Start at 22m taper by 140muntil complete  . triamcinolone cream (KENALOG) 0.1 % APPLY AFFECTED RASH TWICE DAILY UNTIL CLEAR.  . VENTOLIN HFA 108 (90 Base) MCG/ACT inhaler Inhale 2 puffs into the lungs every 6 (six) hours as needed.  . vitamin B-12 (CYANOCOBALAMIN) 1000 MCG tablet Take 1,000 mcg  by mouth daily.   No facility-administered encounter medications on file as of 09/24/2017.     Surgical History: Past Surgical History:  Procedure Laterality Date  . ABDOMINAL HYSTERECTOMY  1970  . BACK SURGERY  2013  . colectomy Right 10/08/2013   Dr. Marina Gravel  . CORONARY ANGIOPLASTY WITH STENT PLACEMENT      Medical History: Past Medical History:  Diagnosis Date  . A-fib ([place])   . Allergy    . Arthritis   . Diabetes mellitus without complication ([place])   . GERD (gastroesophageal reflux disease)   . Hyperlipidemia   . Hypertension   . TIA (transient ischemic attack)     Family History: Family History  Problem Relation Age of Onset  . Breast cancer Mother   . Pancreatitis Father   . Breast cancer Sister   . Lung cancer Brother   . Melanoma Brother   . Throat cancer Brother     Social History: Social History   Socioeconomic History  . Marital status: Widowed    Spouse name: Not on file  . Number of children: 3  . Years of education: H/S  . Highest education level: Not on file  Occupational History  . Occupation: Retired  Scientific laboratory technician  . Financial resource strain: Not on file  . Food insecurity:    Worry: Not on file    Inability: Not on file  . Transportation needs:    Medical: Not on file    Non-medical: Not on file  Tobacco Use  . Smoking status: Former Smoker    Packs/day: 1.00    Years: 30.00    Pack years: 30.00    Last attempt to quit: 11/30/1999    Years since quitting: 17.8  . Smokeless tobacco: Never Used  Substance and Sexual Activity  . Alcohol use: No  . Drug use: No  . Sexual activity: Not on file  Lifestyle  . Physical activity:    Days per week: Not on file    Minutes per session: Not on file  . Stress: Not on file  Relationships  . Social connections:    Talks on phone: Not on file    Gets together: Not on file    Attends religious service: Not on file    Active member of club or organization: Not on file    Attends meetings of clubs or organizations: Not on file    Relationship status: Not on file  . Intimate partner violence:    Fear of current or ex partner: Not on file    Emotionally abused: Not on file    Physically abused: Not on file    Forced sexual activity: Not on file  Other Topics Concern  . Not on file  Social History Narrative  . Not on file    Vital Signs: Blood pressure (!) 142/78, pulse 73, resp.  rate (!) 22, height _0  (1.575 m), weight 154 lb 9.6 oz (70.1 kg), SpO2 92 %.  Examination: General Appearance: The patient is well-developed, well-nourished, and in no distress. Skin: Gross inspection of skin unremarkable. Head: normocephalic, no gross deformities. Eyes: no gross deformities noted. ENT: ears appear grossly normal no exudates. Neck: Supple. No thyromegaly. No LAD. Respiratory:   Bilateral rhonchi are noted. Cardiovascular: Normal S1 and S2 without murmur or rub. Extremities: No cyanosis. pulses are equal. Neurologic: Alert and oriented. No involuntary movements.  LABS: Recent Results (from the past 2160 hour(s))  CBC with Differential     Status: Abnormal  Collection Time: 06/29/17 12:24 PM  Result Value Ref Range   WBC 10.3 3.6 - 11.0 K/uL   RBC 4.86 3.80 - 5.20 MIL/uL   Hemoglobin 12.9 12.0 - 16.0 g/dL   HCT 39.6 35.0 - 47.0 %   MCV 81.5 80.0 - 100.0 fL   MCH 26.6 26.0 - 34.0 pg   MCHC 32.6 32.0 - 36.0 g/dL   RDW 14.2 11.5 - 14.5 %   Platelets 217 150 - 440 K/uL   Neutrophils Relative % 83 %   Neutro Abs 8.6 (H) 1.4 - 6.5 K/uL   Lymphocytes Relative 5 %   Lymphs Abs 0.5 (L) 1.0 - 3.6 K/uL   Monocytes Relative 9 %   Monocytes Absolute 0.9 0.2 - 0.9 K/uL   Eosinophils Relative 2 %   Eosinophils Absolute 0.2 0 - 0.7 K/uL   Basophils Relative 1 %   Basophils Absolute 0.1 0 - 0.1 K/uL    Comment: Performed at [facility], [address] [place], [place] [address]  Basic metabolic panel     Status: Abnormal   Collection Time: 06/29/17 12:24 PM  Result Value Ref Range   Sodium 129 (L) 135 - 145 mmol/L   Potassium 4.0 3.5 - 5.1 mmol/L   Chloride 97 (L) 101 - 111 mmol/L   CO2 20 (L) 22 - 32 mmol/L   Glucose, Bld 294 (H) 65 - 99 mg/dL   BUN 28 (H) 6 - 20 mg/dL   Creatinine, Ser 1.24 (H) 0.44 - 1.00 mg/dL   Calcium 8.7 (L) 8.9 - 10.3 mg/dL   GFR calc non Af Amer 38 (L) >60 mL/min   GFR calc Af Amer 44 (L) >60 mL/min    Comment: (NOTE) The  eGFR has been calculated using the CKD EPI equation. This calculation has not been validated in all clinical situations. eGFR's persistently <60 mL/min signify possible Chronic Kidney Disease.    Anion gap 12 5 - 15    Comment: Performed at [facility], [address] [place], [place] [address]  Brain natriuretic peptide     Status: Abnormal   Collection Time: 06/29/17 12:24 PM  Result Value Ref Range   B Natriuretic Peptide 412.0 (H) 0.0 - 100.0 pg/mL    Comment: Performed at [facility], [address] [place], [place] [address]  Troponin I     Status: Abnormal   Collection Time: 06/29/17 12:24 PM  Result Value Ref Range   Troponin I 0.52 (HH) <0.03 ng/mL    Comment: CRITICAL RESULT CALLED TO, READ BACK BY AND VERIFIED WITH [facility] MARTIN AT 1405 06/29/17 DAS Performed at [facility], [address] [place], [place] [address]   TSH     Status: None   Collection Time: 06/29/17 12:24 PM  Result Value Ref Range   TSH 3.267 0.350 - 4.500 uIU/mL    Comment: Performed by a 3rd Generation assay with a functional sensitivity of <=0.01 uIU/mL. Performed at [facility], [address] [place], [place] [address]   T4, free     Status: None   Collection Time: 06/29/17 12:24 PM  Result Value Ref Range   Free T4 0.95 0.61 - 1.12 ng/dL    Comment: (NOTE) Biotin ingestion may interfere with free T4 tests. If the results are inconsistent with the TSH level, previous test results, or the clinical presentation, then consider biotin interference. If needed, order repeat testing after stopping biotin. Performed at [facility], [address] [place], [place] [address]   Troponin I     Status: Abnormal   Collection Time: 06/29/17  2:15 PM  Result Value Ref Range   Troponin I 0.42 (HH) <0.03 ng/mL    Comment: CRITICAL VALUE NOTED. VALUE IS CONSISTENT WITH PREVIOUSLY REPORTED/CALLED VALUE DAS Performed at [facility], [address] [place], [place] [address]   Basic metabolic panel     Status: Abnormal   Collection Time: 06/30/17 12:26 PM  Result Value Ref Range   Sodium 130 (L) 135 - 145 mmol/L   Potassium 3.9 3.5 - 5.1 mmol/L   Chloride 100 (L) 101 - 111 mmol/L   CO2 21 (L) 22 - 32 mmol/L   Glucose, Bld 321 (H) 65 - 99 mg/dL   BUN 25 (H) 6 - 20 mg/dL   Creatinine, Ser 1.23 (H) 0.44 - 1.00 mg/dL   Calcium 8.6 (L) 8.9 - 10.3 mg/dL   GFR calc non Af Amer 39 (L) >60 mL/min   GFR calc Af Amer 45 (L) >60 mL/min    Comment: (NOTE) The eGFR has been calculated using the CKD EPI equation. This calculation has not been validated in all clinical situations. eGFR's persistently <60 mL/min signify possible Chronic Kidney Disease.    Anion gap 9 5 - 15    Comment: Performed at [facility], [address] [place], [place] [address]  CBC     Status: Abnormal   Collection Time: 06/30/17 12:26 PM  Result Value Ref Range   WBC 11.4 (H) 3.6 - 11.0 K/uL   RBC 4.53 3.80 - 5.20 MIL/uL   Hemoglobin 12.0 12.0 - 16.0 g/dL   HCT 36.8 35.0 - 47.0 %   MCV 81.3 80.0 - 100.0 fL   MCH 26.5 26.0 - 34.0 pg   MCHC 32.6 32.0 - 36.0 g/dL   RDW 14.4 11.5 - 14.5 %   Platelets 245 150 - 440 K/uL    Comment: Performed at [facility], [address] [place], [place] [address]  Troponin I     Status: Abnormal   Collection Time: 06/30/17 12:26 PM  Result Value Ref Range   Troponin I 0.42 (HH) <0.03 ng/mL    Comment: CRITICAL VALUE NOTED. VALUE IS CONSISTENT WITH PREVIOUSLY REPORTED/CALLED VALUE/HKP Performed at [facility], [address] [place], [place] [address]   Hepatic function panel     Status: Abnormal   Collection Time: 06/30/17 12:26 PM  Result Value Ref Range   Total Protein 6.3 (L) 6.5 - 8.1 g/dL   Albumin 3.1 (L) 3.5 - 5.0 g/dL   AST 82 (H) 15 - 41 U/L   ALT 50 14 - 54 U/L   Alkaline Phosphatase 43 38 - 126 U/L   Total Bilirubin 0.8 0.3 - 1.2 mg/dL   Bilirubin, Direct 0.3  0.1 - 0.5 mg/dL   Indirect Bilirubin 0.5 0.3 - 0.9 mg/dL    Comment: Performed at [facility], [address] [place], [place] [address]  Protime-INR     Status: Abnormal   Collection Time: 06/30/17 12:26 PM  Result Value Ref Range   Prothrombin Time 16.3 (H) 11.4 - 15.2 seconds   INR 1.32     Comment: Performed at [facility], [address] [place], [place] [address]  APTT     Status: None   Collection Time: 06/30/17 12:26 PM  Result Value Ref Range   aPTT 34 24 - 36 seconds    Comment: Performed at  [facility], [address] [place], [place] [address]  Urinalysis, Complete w Microscopic     Status: Abnormal   Collection Time: 06/30/17  4:52 PM  Result Value Ref Range   Color, Urine YELLOW (A) YELLOW   APPearance CLOUDY (A) CLEAR   Specific Gravity, Urine 1.018 1.005 - 1.030   pH 5.0 5.0 - 8.0   Glucose, UA >=500 (A) NEGATIVE mg/dL   Hgb urine dipstick SMALL (A) NEGATIVE   Bilirubin Urine NEGATIVE NEGATIVE   Ketones, ur NEGATIVE NEGATIVE mg/dL   Protein, ur NEGATIVE NEGATIVE mg/dL   Nitrite NEGATIVE NEGATIVE   Leukocytes, UA NEGATIVE NEGATIVE   RBC / HPF 6-30 0 - 5 RBC/hpf   WBC, UA 6-30 0 - 5 WBC/hpf   Bacteria, UA RARE (A) NONE SEEN   Squamous Epithelial / LPF 0-5 (A) NONE SEEN   Mucus PRESENT    Uric Acid Crys, UA PRESENT    Crystals PRESENT (A) NEGATIVE    Comment: Performed at [facility], [address] [place], [place] [address]  Glucose, capillary     Status: Abnormal   Collection Time: 06/30/17  7:28 PM  Result Value Ref Range   Glucose-Capillary 269 (H) 65 - 99 mg/dL  Glucose, capillary     Status: Abnormal   Collection Time: 06/30/17  9:16 PM  Result Value Ref Range   Glucose-Capillary 303 (H) 65 - 99 mg/dL  Glucose, capillary     Status: Abnormal   Collection Time: 06/30/17 11:36 PM  Result Value Ref Range   Glucose-Capillary 221 (H) 65 - 99 mg/dL  Basic metabolic panel     Status: Abnormal   Collection  Time: 07/01/17  4:02 AM  Result Value Ref Range   Sodium 132 (L) 135 - 145 mmol/L   Potassium 3.6 3.5 - 5.1 mmol/L   Chloride 103 101 - 111 mmol/L   CO2 20 (L) 22 - 32 mmol/L   Glucose, Bld 212 (H) 65 - 99 mg/dL   BUN 20 6 - 20 mg/dL   Creatinine, Ser 0.99 0.44 - 1.00 mg/dL   Calcium 8.3 (L) 8.9 - 10.3 mg/dL   GFR calc non Af Amer 50 (L) >60 mL/min   GFR calc Af Amer 58 (L) >60 mL/min    Comment: (NOTE) The eGFR has been calculated using the CKD EPI equation. This calculation has not been validated in all clinical situations. eGFR's persistently <60 mL/min signify possible Chronic Kidney Disease.    Anion gap 9 5 - 15    Comment: Performed at [facility], [address] [place], [place] [address]  CBC     Status: Abnormal   Collection Time: 07/01/17  4:02 AM  Result Value Ref Range   WBC 10.7 3.6 - 11.0 K/uL   RBC 4.10 3.80 - 5.20 MIL/uL   Hemoglobin 10.9 (L) 12.0 - 16.0 g/dL   HCT 33.2 (L) 35.0 - 47.0 %   MCV 80.9 80.0 - 100.0 fL   MCH 26.7 26.0 - 34.0 pg   MCHC 33.0 32.0 - 36.0 g/dL   RDW 14.3 11.5 - 14.5 %   Platelets 249 150 - 440 K/uL    Comment: Performed at [facility], [address] [place], [place] [address]  Glucose, capillary     Status: Abnormal   Collection Time: 07/01/17  7:57 AM  Result Value Ref Range   Glucose-Capillary 214 (H) 65 - 99 mg/dL  Glucose, capillary     Status: Abnormal  Collection Time: 07/01/17 12:22 PM  Result Value Ref Range   Glucose-Capillary 158 (H) 65 - 99 mg/dL  Glucose, capillary     Status: Abnormal   Collection Time: 07/01/17  5:12 PM  Result Value Ref Range   Glucose-Capillary 258 (H) 65 - 99 mg/dL  Glucose, capillary     Status: Abnormal   Collection Time: 07/01/17  8:44 PM  Result Value Ref Range   Glucose-Capillary 203 (H) 65 - 99 mg/dL  Glucose, capillary     Status: Abnormal   Collection Time: 07/02/17  7:52 AM  Result Value Ref Range   Glucose-Capillary 153 (H) 65 - 99 mg/dL  Glucose,  capillary     Status: Abnormal   Collection Time: 07/02/17 12:01 PM  Result Value Ref Range   Glucose-Capillary 215 (H) 65 - 99 mg/dL  Glucose, capillary     Status: Abnormal   Collection Time: 07/02/17  4:40 PM  Result Value Ref Range   Glucose-Capillary 256 (H) 65 - 99 mg/dL  Glucose, capillary     Status: Abnormal   Collection Time: 07/02/17  9:55 PM  Result Value Ref Range   Glucose-Capillary 213 (H) 65 - 99 mg/dL  Basic metabolic panel     Status: Abnormal   Collection Time: 07/03/17  5:56 AM  Result Value Ref Range   Sodium 137 135 - 145 mmol/L   Potassium 3.4 (L) 3.5 - 5.1 mmol/L   Chloride 98 (L) 101 - 111 mmol/L   CO2 27 22 - 32 mmol/L   Glucose, Bld 189 (H) 65 - 99 mg/dL   BUN 38 (H) 6 - 20 mg/dL   Creatinine, Ser 1.20 (H) 0.44 - 1.00 mg/dL   Calcium 9.4 8.9 - 10.3 mg/dL   GFR calc non Af Amer 40 (L) >60 mL/min   GFR calc Af Amer 46 (L) >60 mL/min    Comment: (NOTE) The eGFR has been calculated using the CKD EPI equation. This calculation has not been validated in all clinical situations. eGFR's persistently <60 mL/min signify possible Chronic Kidney Disease.    Anion gap 12 5 - 15    Comment: Performed at [facility], [address] [place], [place] [address]  Magnesium     Status: None   Collection Time: 07/03/17  5:56 AM  Result Value Ref Range   Magnesium 1.9 1.7 - 2.4 mg/dL    Comment: Performed at [facility], [address] [place], [place] [address]  Glucose, capillary     Status: Abnormal   Collection Time: 07/03/17  7:48 AM  Result Value Ref Range   Glucose-Capillary 249 (H) 65 - 99 mg/dL  Glucose, capillary     Status: Abnormal   Collection Time: 07/03/17 12:10 PM  Result Value Ref Range   Glucose-Capillary 224 (H) 65 - 99 mg/dL  Glucose, capillary     Status: Abnormal   Collection Time: 07/03/17  4:57 PM  Result Value Ref Range   Glucose-Capillary 191 (H) 65 - 99 mg/dL  Glucose, capillary     Status: Abnormal    Collection Time: 07/03/17  9:16 PM  Result Value Ref Range   Glucose-Capillary 184 (H) 65 - 99 mg/dL  Glucose, capillary     Status: Abnormal   Collection Time: 07/04/17  7:43 AM  Result Value Ref Range   Glucose-Capillary 216 (H) 65 - 99 mg/dL  CBC     Status: Abnormal   Collection Time: 08/03/17  4:10 PM  Result Value Ref Range  WBC 10.0 3.6 - 11.0 K/uL   RBC 5.01 3.80 - 5.20 MIL/uL   Hemoglobin 13.5 12.0 - 16.0 g/dL   HCT 41.5 35.0 - 47.0 %   MCV 82.8 80.0 - 100.0 fL   MCH 27.0 26.0 - 34.0 pg   MCHC 32.6 32.0 - 36.0 g/dL   RDW 16.1 (H) 11.5 - 14.5 %   Platelets 286 150 - 440 K/uL    Comment: Performed at [facility], [address] [place], [place] [address]  Troponin I     Status: Abnormal   Collection Time: 08/03/17  4:10 PM  Result Value Ref Range   Troponin I 0.44 (HH) <0.03 ng/mL    Comment: CRITICAL RESULT CALLED TO, READ BACK BY AND VERIFIED WITH DR Alfred Levins ON 08/03/17 AT 1652 [facility] Performed at [facility], [address] [place], [place] [address]   Comprehensive metabolic panel     Status: Abnormal   Collection Time: 08/03/17  4:10 PM  Result Value Ref Range   Sodium 139 135 - 145 mmol/L   Potassium 4.2 3.5 - 5.1 mmol/L   Chloride 105 101 - 111 mmol/L   CO2 24 22 - 32 mmol/L   Glucose, Bld 179 (H) 65 - 99 mg/dL   BUN 25 (H) 6 - 20 mg/dL   Creatinine, Ser 1.23 (H) 0.44 - 1.00 mg/dL   Calcium 10.0 8.9 - 10.3 mg/dL   Total Protein 7.4 6.5 - 8.1 g/dL   Albumin 4.1 3.5 - 5.0 g/dL   AST 31 15 - 41 U/L   ALT 27 14 - 54 U/L   Alkaline Phosphatase 37 (L) 38 - 126 U/L   Total Bilirubin 0.6 0.3 - 1.2 mg/dL   GFR calc non Af Amer 39 (L) >60 mL/min   GFR calc Af Amer 45 (L) >60 mL/min    Comment: (NOTE) The eGFR has been calculated using the CKD EPI equation. This calculation has not been validated in all clinical situations. eGFR's persistently <60 mL/min signify possible Chronic Kidney Disease.    Anion gap 10 5 - 15    Comment: Performed  at [facility], [address] [place], [place] [address]  Magnesium     Status: None   Collection Time: 08/03/17  4:10 PM  Result Value Ref Range   Magnesium 1.7 1.7 - 2.4 mg/dL    Comment: Performed at [facility], [address] [place], [place] [address]  Basic metabolic panel     Status: Abnormal   Collection Time: 08/07/17 12:17 AM  Result Value Ref Range   Sodium 137 135 - 145 mmol/L   Potassium 5.1 3.5 - 5.1 mmol/L   Chloride 105 101 - 111 mmol/L   CO2 22 22 - 32 mmol/L   Glucose, Bld 246 (H) 65 - 99 mg/dL   BUN 26 (H) 6 - 20 mg/dL   Creatinine, Ser 1.69 (H) 0.44 - 1.00 mg/dL   Calcium 9.4 8.9 - 10.3 mg/dL   GFR calc non Af Amer 26 (L) >60 mL/min   GFR calc Af Amer 30 (L) >60 mL/min    Comment: (NOTE) The eGFR has been calculated using the CKD EPI equation. This calculation has not been validated in all clinical situations. eGFR's persistently <60 mL/min signify possible Chronic Kidney Disease.    Anion gap 10 5 - 15    Comment: Performed at [facility], [address] [place], [place] [address]  CBC     Status: Abnormal  Collection Time: 08/07/17 12:17 AM  Result Value Ref Range   WBC 17.0 (H) 3.6 - 11.0 K/uL   RBC 4.99 3.80 - 5.20 MIL/uL   Hemoglobin 13.3 12.0 - 16.0 g/dL   HCT 41.8 35.0 - 47.0 %   MCV 83.9 80.0 - 100.0 fL   MCH 26.7 26.0 - 34.0 pg   MCHC 31.8 (L) 32.0 - 36.0 g/dL   RDW 16.2 (H) 11.5 - 14.5 %   Platelets 416 150 - 440 K/uL    Comment: Performed at [facility], [address] [place], [place] [address]  Troponin I     Status: Abnormal   Collection Time: 08/07/17 12:17 AM  Result Value Ref Range   Troponin I 0.38 (HH) <0.03 ng/mL    Comment: CRITICAL RESULT CALLED TO, READ BACK BY AND VERIFIED WITH DEIJA SCOTT ON 08/07/17 AT [place] [facility] Performed at [facility], [address] [place], [place] [address]   Blood gas, venous     Status: Abnormal   Collection Time: 08/07/17 12:20 AM   Result Value Ref Range   FIO2 0.60    Delivery systems BILEVEL POSITIVE AIRWAY PRESSURE    pH, Ven 7.15 (LL) 7.250 - 7.430    Comment: CRITICAL RESULT CALLED TO, READ BACK BY AND VERIFIED WITH: DR.R.BROWN AT 0030 ON 08/07/17 KSL    pCO2, Ven 70 (H) 44.0 - 60.0 mmHg   pO2, Ven 136.0 (H) 32.0 - 45.0 mmHg   Bicarbonate 24.4 20.0 - 28.0 mmol/L   Acid-base deficit 5.9 (H) 0.0 - 2.0 mmol/L   O2 Saturation 98.3 %   Patient temperature 37.0    Collection site VENOUS    Sample type VENOUS     Comment: Performed at [facility], [address] [place], [place] [address]  Blood gas, arterial     Status: Abnormal   Collection Time: 08/07/17  1:43 AM  Result Value Ref Range   FIO2 0.60    Delivery systems BILEVEL POSITIVE AIRWAY PRESSURE    Inspiratory PAP 12    Expiratory PAP 6.0    pH, Arterial 7.28 (L) 7.350 - 7.450   pCO2 arterial 49 (H) 32.0 - 48.0 mmHg   pO2, Arterial 232 (H) 83.0 - 108.0 mmHg   Bicarbonate 23.0 20.0 - 28.0 mmol/L   Acid-base deficit 4.1 (H) 0.0 - 2.0 mmol/L   O2 Saturation 99.8 %   Patient temperature 37.0    Collection site RIGHT RADIAL    Sample type ARTERIAL DRAW    Allens test (pass/fail) PASS PASS   Mechanical Rate 8     Comment: Performed at [facility], [address] [place], [place] [address]  TSH     Status: None   Collection Time: 08/07/17 12:50 PM  Result Value Ref Range   TSH 1.393 0.350 - 4.500 uIU/mL    Comment: Performed by a 3rd Generation assay with a functional sensitivity of <=0.01 uIU/mL. Performed at [facility], [address] [place], [place] [address]   Hemoglobin A1c     Status: Abnormal   Collection Time: 08/07/17 12:50 PM  Result Value Ref Range   Hgb A1c MFr Bld 8.4 (H) 4.8 - 5.6 %    Comment: (NOTE)         Prediabetes: 5.7 - 6.4         Diabetes: >6.4         Glycemic control for adults with diabetes: <7.0    Mean Plasma Glucose 194  mg/dL    Comment: (NOTE) Performed At: [facility] [place], [place] [id number] Rush Farmer MD [id number] Performed at [facility], [address] [place], [place] [address]   Troponin I     Status: Abnormal   Collection Time: 08/07/17 12:50 PM  Result Value Ref Range   Troponin I 0.57 (HH) <0.03 ng/mL    Comment: CRITICAL VALUE NOTED. VALUE IS CONSISTENT WITH PREVIOUSLY REPORTED/CALLED VALUE AKT Performed at [facility], [address] [place], [place] [address]   Blood gas, arterial     Status: Abnormal   Collection Time: 08/07/17  2:05 PM  Result Value Ref Range   FIO2 0.30    Delivery systems NASAL CANNULA    pH, Arterial 7.39 7.350 - 7.450   pCO2 arterial 41 32.0 - 48.0 mmHg   pO2, Arterial 61 (L) 83.0 - 108.0 mmHg   Bicarbonate 24.8 20.0 - 28.0 mmol/L   Acid-base deficit 0.2 0.0 - 2.0 mmol/L   O2 Saturation 90.7 %   Patient temperature 37.0    Collection site RIGHT BRACHIAL    Sample type ARTERIAL DRAW    Allens test (pass/fail) ARTERIAL DRAW (A) PASS    Comment: Performed at [facility], [address] [place], [place] [address]  Glucose, capillary     Status: Abnormal   Collection Time: 08/07/17  4:31 PM  Result Value Ref Range   Glucose-Capillary 337 (H) 65 - 99 mg/dL  Troponin I     Status: Abnormal   Collection Time: 08/07/17  6:30 PM  Result Value Ref Range   Troponin I 0.40 (HH) <0.03 ng/mL    Comment: CRITICAL VALUE NOTED. VALUE IS CONSISTENT WITH PREVIOUSLY REPORTED/CALLED VALUE.PMH Performed at [facility], [address] [place], [place] [address]   Glucose, capillary     Status: Abnormal   Collection Time: 08/07/17  8:06 PM  Result Value Ref Range   Glucose-Capillary 323 (H) 65 - 99 mg/dL  Troponin I     Status: Abnormal   Collection Time: 08/08/17 12:55 AM  Result Value Ref Range   Troponin I 0.39 (HH) <0.03 ng/mL    Comment: CRITICAL VALUE NOTED. VALUE IS CONSISTENT WITH PREVIOUSLY REPORTED/CALLED VALUE.PMH Performed at  [facility], [address] [place], [place] [address]   Glucose, capillary     Status: Abnormal   Collection Time: 08/08/17  7:36 AM  Result Value Ref Range   Glucose-Capillary 270 (H) 65 - 99 mg/dL  Troponin I (q 6hr x 3)     Status: Abnormal   Collection Time: 08/08/17 11:51 AM  Result Value Ref Range   Troponin I 0.38 (HH) <0.03 ng/mL    Comment: CRITICAL VALUE NOTED. VALUE IS CONSISTENT WITH PREVIOUSLY REPORTED/CALLED VALUE [facility] Performed at [facility], [address] [place], [place] [address]   Glucose, capillary     Status: Abnormal   Collection Time: 08/08/17 12:21 PM  Result Value Ref Range   Glucose-Capillary 253 (H) 65 - 99 mg/dL  Glucose, capillary     Status: Abnormal   Collection Time: 08/08/17  4:37 PM  Result Value Ref Range   Glucose-Capillary 269 (H) 65 - 99 mg/dL  Troponin I (q 6hr x 3)     Status: Abnormal   Collection Time: 08/08/17  5:10 PM  Result Value Ref Range   Troponin I 0.39 (HH) <0.03 ng/mL    Comment: CRITICAL VALUE NOTED. VALUE IS CONSISTENT WITH PREVIOUSLY REPORTED/CALLED VALUE.MSS Performed  at [facility], [address] [place], [place] [address]   Glucose, capillary     Status: Abnormal   Collection Time: 08/08/17  9:07 PM  Result Value Ref Range   Glucose-Capillary 302 (H) 65 - 99 mg/dL  Glucose, capillary     Status: Abnormal   Collection Time: 08/09/17  7:27 AM  Result Value Ref Range   Glucose-Capillary 265 (H) 65 - 99 mg/dL  Glucose, capillary     Status: Abnormal   Collection Time: 08/09/17 12:08 PM  Result Value Ref Range   Glucose-Capillary 226 (H) 65 - 99 mg/dL   Comment 1 Notify RN    Comment 2 Document in Chart   Influenza panel by PCR (type A & B)     Status: None   Collection Time: 08/09/17  2:59 PM  Result Value Ref Range   Influenza A By PCR NEGATIVE NEGATIVE   Influenza B By PCR NEGATIVE NEGATIVE    Comment: (NOTE) The Xpert Xpress Flu assay is intended as an aid in the diagnosis  of  influenza and should not be used as a sole basis for treatment.  This  assay is FDA approved for nasopharyngeal swab specimens only. Nasal  washings and aspirates are unacceptable for Xpert Xpress Flu testing. Performed at [facility], [address] [place], [place] [address]   Glucose, capillary     Status: Abnormal   Collection Time: 08/09/17  4:28 PM  Result Value Ref Range   Glucose-Capillary 226 (H) 65 - 99 mg/dL  Glucose, capillary     Status: Abnormal   Collection Time: 08/09/17  8:51 PM  Result Value Ref Range   Glucose-Capillary 292 (H) 65 - 99 mg/dL  MRSA PCR Screening     Status: None   Collection Time: 08/09/17  9:22 PM  Result Value Ref Range   MRSA by PCR NEGATIVE NEGATIVE    Comment:        The GeneXpert MRSA Assay (FDA approved for NASAL specimens only), is one component of a comprehensive MRSA colonization surveillance program. It is not intended to diagnose MRSA infection nor to guide or monitor treatment for MRSA infections. Performed at [facility], [address] [place], [place] [address]   Blood gas, arterial     Status: Abnormal   Collection Time: 08/09/17 11:41 PM  Result Value Ref Range   FIO2 0.55    Delivery systems VENTURI MASK    pH, Arterial 7.37 7.350 - 7.450   pCO2 arterial 42 32.0 - 48.0 mmHg   pO2, Arterial 56 (L) 83.0 - 108.0 mmHg   Bicarbonate 24.3 20.0 - 28.0 mmol/L   Acid-base deficit 1.1 0.0 - 2.0 mmol/L   O2 Saturation 87.8 %   Patient temperature 37.0    Collection site RIGHT RADIAL    Sample type ARTERIAL DRAW    Allens test (pass/fail) PASS PASS    Comment: Performed at [facility], [address] [place], [place] [address]  Glucose, capillary     Status: Abnormal   Collection Time: 08/10/17  7:31 AM  Result Value Ref Range   Glucose-Capillary 233 (H) 65 - 99 mg/dL  CBC     Status: Abnormal   Collection Time: 08/10/17 10:04 AM  Result Value Ref Range   WBC 15.6 (H) 3.6 - 11.0  K/uL   RBC 4.45 3.80 - 5.20 MIL/uL   Hemoglobin 12.0 12.0 - 16.0 g/dL   HCT 37.2 35.0 - 47.0 %  MCV 83.7 80.0 - 100.0 fL   MCH 26.9 26.0 - 34.0 pg   MCHC 32.1 32.0 - 36.0 g/dL   RDW 16.7 (H) 11.5 - 14.5 %   Platelets 319 150 - 440 K/uL    Comment: Performed at [facility], [address] [place], [place] [address]  Basic metabolic panel     Status: Abnormal   Collection Time: 08/10/17 10:04 AM  Result Value Ref Range   Sodium 136 135 - 145 mmol/L   Potassium 4.1 3.5 - 5.1 mmol/L   Chloride 102 101 - 111 mmol/L   CO2 25 22 - 32 mmol/L   Glucose, Bld 361 (H) 65 - 99 mg/dL   BUN 44 (H) 6 - 20 mg/dL   Creatinine, Ser 1.10 (H) 0.44 - 1.00 mg/dL   Calcium 8.8 (L) 8.9 - 10.3 mg/dL   GFR calc non Af Amer 44 (L) >60 mL/min   GFR calc Af Amer 51 (L) >60 mL/min    Comment: (NOTE) The eGFR has been calculated using the CKD EPI equation. This calculation has not been validated in all clinical situations. eGFR's persistently <60 mL/min signify possible Chronic Kidney Disease.    Anion gap 9 5 - 15    Comment: Performed at [facility], [address] [place], [place] [address]  Glucose, capillary     Status: Abnormal   Collection Time: 08/10/17 11:26 AM  Result Value Ref Range   Glucose-Capillary 298 (H) 65 - 99 mg/dL  Glucose, capillary     Status: Abnormal   Collection Time: 08/10/17  5:25 PM  Result Value Ref Range   Glucose-Capillary 183 (H) 65 - 99 mg/dL  Glucose, capillary     Status: Abnormal   Collection Time: 08/10/17  8:59 PM  Result Value Ref Range   Glucose-Capillary 272 (H) 65 - 99 mg/dL  Glucose, capillary     Status: Abnormal   Collection Time: 08/11/17  7:26 AM  Result Value Ref Range   Glucose-Capillary 193 (H) 65 - 99 mg/dL  Glucose, capillary     Status: Abnormal   Collection Time: 08/11/17 11:47 AM  Result Value Ref Range   Glucose-Capillary 345 (H) 65 - 99 mg/dL  Glucose, capillary     Status: Abnormal   Collection Time: 08/11/17   4:35 PM  Result Value Ref Range   Glucose-Capillary 360 (H) 65 - 99 mg/dL  Glucose, capillary     Status: Abnormal   Collection Time: 08/11/17  9:12 PM  Result Value Ref Range   Glucose-Capillary 269 (H) 65 - 99 mg/dL  Glucose, capillary     Status: Abnormal   Collection Time: 08/12/17  7:41 AM  Result Value Ref Range   Glucose-Capillary 219 (H) 65 - 99 mg/dL  Glucose, capillary     Status: Abnormal   Collection Time: 08/12/17 11:47 AM  Result Value Ref Range   Glucose-Capillary 227 (H) 65 - 99 mg/dL  Basic metabolic panel     Status: Abnormal   Collection Time: 08/12/17  1:01 PM  Result Value Ref Range   Sodium 135 135 - 145 mmol/L   Potassium 4.0 3.5 - 5.1 mmol/L   Chloride 97 (L) 101 - 111 mmol/L   CO2 28 22 - 32 mmol/L   Glucose, Bld 238 (H) 65 - 99 mg/dL   BUN 37 (H) 6 - 20 mg/dL   Creatinine, Ser 0.93 0.44 - 1.00 mg/dL   Calcium 8.9 8.9 - 10.3 mg/dL   GFR calc  non Af Amer 54 (L) >60 mL/min   GFR calc Af Amer >60 >60 mL/min    Comment: (NOTE) The eGFR has been calculated using the CKD EPI equation. This calculation has not been validated in all clinical situations. eGFR's persistently <60 mL/min signify possible Chronic Kidney Disease.    Anion gap 10 5 - 15    Comment: Performed at [facility], [place], [place] [address]  Procalcitonin - Baseline     Status: None   Collection Time: 08/12/17  1:01 PM  Result Value Ref Range   Procalcitonin <0.10 ng/mL    Comment:        Interpretation: PCT (Procalcitonin) <= 0.5 ng/mL: Systemic infection (sepsis) is not likely. Local bacterial infection is possible. (NOTE)       Sepsis PCT Algorithm           Lower Respiratory Tract                                      Infection PCT Algorithm    ----------------------------     ----------------------------         PCT < 0.25 ng/mL                PCT < 0.10 ng/mL         Strongly encourage             Strongly discourage   discontinuation of  antibiotics    initiation of antibiotics    ----------------------------     -----------------------------       PCT 0.25 - 0.50 ng/mL            PCT 0.10 - 0.25 ng/mL               OR       >80% decrease in PCT            Discourage initiation of                                            antibiotics      Encourage discontinuation           of antibiotics    ----------------------------     -----------------------------         PCT >= 0.50 ng/mL              PCT 0.26 - 0.50 ng/mL               AND        <80% decrease in PCT             Encourage initiation of                                             antibiotics       Encourage continuation           of antibiotics    ----------------------------     -----------------------------        PCT >= 0.50 ng/mL                  PCT > 0.50 ng/mL  AND         increase in PCT                  Strongly encourage                                      initiation of antibiotics    Strongly encourage escalation           of antibiotics                                     -----------------------------                                           PCT <= 0.25 ng/mL                                                 OR                                        > 80% decrease in PCT                                     Discontinue / Do not initiate                                             antibiotics Performed at [facility], [address] [place], [place] [address]   Glucose, capillary     Status: Abnormal   Collection Time: 08/12/17  4:43 PM  Result Value Ref Range   Glucose-Capillary 267 (H) 65 - 99 mg/dL  Glucose, capillary     Status: Abnormal   Collection Time: 08/12/17  9:00 PM  Result Value Ref Range   Glucose-Capillary 347 (H) 65 - 99 mg/dL  Culture, expectorated sputum-assessment     Status: None   Collection Time: 08/13/17  1:03 AM  Result Value Ref Range   Specimen Description SPUTUM    Special Requests  Normal    Sputum evaluation      THIS SPECIMEN IS ACCEPTABLE FOR SPUTUM CULTURE Performed at [facility], [address] [place], [place] [address]    Report Status 08/13/2017 FINAL   Culture, respiratory (NON-Expectorated)     Status: None   Collection Time: 08/13/17  1:03 AM  Result Value Ref Range   Specimen Description      SPUTUM Performed at [facility], [address], [place], [place] [address]    Special Requests      Normal Reflexed from [phone] Performed at [facility], [place], [place] [address]    Gram Stain      FEW WBC PRESENT,BOTH PMN AND MONONUCLEAR RARE GRAM POSITIVE COCCI    Culture      Consistent with  normal respiratory flora. Performed at [facility], [place] [address], [place], [place] [address]    Report Status 08/15/2017 FINAL   Glucose, capillary     Status: Abnormal   Collection Time: 08/13/17  7:38 AM  Result Value Ref Range   Glucose-Capillary 191 (H) 65 - 99 mg/dL  Procalcitonin - Baseline     Status: None   Collection Time: 08/13/17  9:00 AM  Result Value Ref Range   Procalcitonin <0.10 ng/mL    Comment:        Interpretation: PCT (Procalcitonin) <= 0.5 ng/mL: Systemic infection (sepsis) is not likely. Local bacterial infection is possible. (NOTE)       Sepsis PCT Algorithm           Lower Respiratory Tract                                      Infection PCT Algorithm    ----------------------------     ----------------------------         PCT < 0.25 ng/mL                PCT < 0.10 ng/mL         Strongly encourage             Strongly discourage   discontinuation of antibiotics    initiation of antibiotics    ----------------------------     -----------------------------       PCT 0.25 - 0.50 ng/mL            PCT 0.10 - 0.25 ng/mL               OR       >80% decrease in PCT            Discourage initiation of                                            antibiotics      Encourage  discontinuation           of antibiotics    ----------------------------     -----------------------------         PCT >= 0.50 ng/mL              PCT 0.26 - 0.50 ng/mL               AND        <80% decrease in PCT             Encourage initiation of                                             antibiotics       Encourage continuation           of antibiotics    ----------------------------     -----------------------------        PCT >= 0.50 ng/mL                  PCT > 0.50 ng/mL               AND         increase in PCT  Strongly encourage                                      initiation of antibiotics    Strongly encourage escalation           of antibiotics                                     -----------------------------                                           PCT <= 0.25 ng/mL                                                 OR                                        > 80% decrease in PCT                                     Discontinue / Do not initiate                                             antibiotics Performed at [facility], [address] [place], [place] [address]   Glucose, capillary     Status: Abnormal   Collection Time: 08/13/17 11:52 AM  Result Value Ref Range   Glucose-Capillary 311 (H) 65 - 99 mg/dL  Glucose, capillary     Status: Abnormal   Collection Time: 08/13/17  4:19 PM  Result Value Ref Range   Glucose-Capillary 312 (H) 65 - 99 mg/dL  Glucose, capillary     Status: Abnormal   Collection Time: 08/13/17  9:46 PM  Result Value Ref Range   Glucose-Capillary 356 (H) 65 - 99 mg/dL   Comment 1 Notify RN   Glucose, capillary     Status: None   Collection Time: 08/14/17  7:40 AM  Result Value Ref Range   Glucose-Capillary 77 65 - 99 mg/dL    Radiology: Ct Chest W Contrast  Result Date: 09/08/2017 CLINICAL DATA:  84 year old female with difficulty breathing worse when lying down. Subsequent encounter. EXAM: CT CHEST WITH  CONTRAST TECHNIQUE: Multidetector CT imaging of the chest was performed during intravenous contrast administration. CONTRAST:  67m ISOVUE-300 IOPAMIDOL (ISOVUE-300) INJECTION 61% COMPARISON:  08/12/2017, 08/07/2017 and 10/07/2013 chest CT. FINDINGS: Cardiovascular: No central pulmonary embolus. Prominent main pulmonary arteries greater on the right. Question component of pulmonary hypertension. Atherosclerotic changes thoracic aorta without aneurysm or dissection. Heart size top-normal.  Prominent coronary artery calcifications. Mediastinum/Nodes: Scattered normal/top-normal size mediastinal lymph nodes. Lungs/Pleura: Interval clearing of left lower lobe collapse/atelectasis. Fluctuating ground-glass opacities now most notable within the superior aspect of the right lower lobe and the superior aspect of right upper lobe. Question possibility  of hypersensitivity pneumonitis. 8 mm pulmonary nodule superior aspect left lower lobe unchanged from 2015 and therefore most likely benign. Upper Abdomen: No worrisome abnormality. Musculoskeletal: No worrisome osseous lesion. IMPRESSION: Interval clearing of left lower lobe collapse/atelectasis. Fluctuating ground-glass opacities now most notable within the superior aspect of the right lower lobe and the superior aspect of right upper lobe. Question possibility of hypersensitivity pneumonitis. 8 mm pulmonary nodule superior aspect left lower lobe unchanged from 2015 and therefore most likely benign. No central pulmonary embolus. Prominence of right and left main pulmonary artery raise possibility of component of underlying pulmonary hypertension. Prominent coronary artery calcifications. Aortic Atherosclerosis (ICD10-I70.0). Electronically Signed   By: Genia Del M.D.   On: 09/08/2017 19:25    No results found.  Ct Chest W Contrast  Result Date: 09/08/2017 CLINICAL DATA:  84 year old female with difficulty breathing worse when lying down. Subsequent encounter. EXAM:  CT CHEST WITH CONTRAST TECHNIQUE: Multidetector CT imaging of the chest was performed during intravenous contrast administration. CONTRAST:  30m ISOVUE-300 IOPAMIDOL (ISOVUE-300) INJECTION 61% COMPARISON:  08/12/2017, 08/07/2017 and 10/07/2013 chest CT. FINDINGS: Cardiovascular: No central pulmonary embolus. Prominent main pulmonary arteries greater on the right. Question component of pulmonary hypertension. Atherosclerotic changes thoracic aorta without aneurysm or dissection. Heart size top-normal.  Prominent coronary artery calcifications. Mediastinum/Nodes: Scattered normal/top-normal size mediastinal lymph nodes. Lungs/Pleura: Interval clearing of left lower lobe collapse/atelectasis. Fluctuating ground-glass opacities now most notable within the superior aspect of the right lower lobe and the superior aspect of right upper lobe. Question possibility of hypersensitivity pneumonitis. 8 mm pulmonary nodule superior aspect left lower lobe unchanged from 2015 and therefore most likely benign. Upper Abdomen: No worrisome abnormality. Musculoskeletal: No worrisome osseous lesion. IMPRESSION: Interval clearing of left lower lobe collapse/atelectasis. Fluctuating ground-glass opacities now most notable within the superior aspect of the right lower lobe and the superior aspect of right upper lobe. Question possibility of hypersensitivity pneumonitis. 8 mm pulmonary nodule superior aspect left lower lobe unchanged from 2015 and therefore most likely benign. No central pulmonary embolus. Prominence of right and left main pulmonary artery raise possibility of component of underlying pulmonary hypertension. Prominent coronary artery calcifications. Aortic Atherosclerosis (ICD10-I70.0). Electronically Signed   By: SGenia DelM.D.   On: 09/08/2017 19:25      Assessment and Plan: Patient Active Problem List   Diagnosis Date Noted  . History of stroke 08/17/2017  . Acute respiratory failure with hypoxia and  hypercarbia ([place] 08/07/2017  . Respiratory failure ([place] 08/07/2017  . History of GI bleed 07/07/2017  . Atrial fibrillation with RVR ([place] 06/30/2017  . Bilateral carotid artery disease ([place] 04/14/2017  . Healthcare maintenance 04/14/2017  . Dizziness 04/07/2017  . 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 TIA (transient ischemic attack) 12/10/2016  . CVA (cerebral vascular accident) ([place] 12/09/2016  . Bilateral arm pain 11/27/2016  . Myocardial infarction ([place] 07/30/2015  . Urinary frequency 02/09/2015  . RUQ abdominal pain 01/26/2015  . Shortness of breath 01/26/2015  . Anxiety 01/26/2015  . Chronic pruritus 12/21/2014  . Cerebral vascular accident ([place] 11/16/2014  . Chronic low back pain 11/16/2014  . Adult hypothyroidism 11/15/2014  . Allergic rhinitis 11/15/2014  . Body mass index (BMI) of 28.0-28.9 in adult 11/15/2014  . Arthritis 11/15/2014  . Cramps of lower extremity 11/15/2014  . Essential (primary) hypertension 11/15/2014  . Acid reflux 11/15/2014  . Hypercholesteremia 11/15/2014  . Cannot sleep 11/15/2014  . Psoriasis 11/15/2014  . Itch of skin 11/15/2014  .  Restless leg 11/15/2014  . Diabetes mellitus, type 2 ([place]) 11/15/2014  . Breath shortness 11/15/2014  . Dermatitis due to unknown cause 04/10/2014  . Arteriosclerosis of coronary artery 02/20/2014  . AF (paroxysmal atrial fibrillation) ([place]) 02/20/2014  . Temporary cerebral vascular dysfunction 02/20/2014  . DD (diverticular disease) 10/05/2013    1.  acute on chronic Respiratory failure with hypoxia her saturation at rest is 92% she has significant desaturation with ambulation and increased work breathing and shortness of breath.  I think it would be best for her to be admitted to the hospital at this time.   2. Left lower lobe atelectasis this has resolved based on the last CT however she did have ground-glass opacities noted and my concern would be hypersensitivity pneumonitis versus  a developing pneumonia on the last CT. 3. Atrial fibrillation chronic patient is doing fine at this time we will continue with supportive care 4. Hypoxia will check a 6 min walk to see if there is any further desaturations at this time.  Made an attempt at the 6 min walk over she was not able to tolerate  General Counseling: I have discussed the findings of the evaluation and examination with Zigmund Daniel.  I have also discussed any further diagnostic evaluation thatmay be needed or ordered today. Kayona verbalizes understanding of the findings of todays visit. We also reviewed her medications today and discussed drug interactions and side effects including but not limited excessive drowsiness and altered mental states. We also discussed that there is always a risk not just to her but also people around her. she has been encouraged to call the office with any questions or concerns that should arise related to todays visit.    Time spent: 75mn  I have personally obtained a history, examined the patient, evaluated laboratory and imaging results, formulated the assessment and plan and placed orders.    SAllyne Gee MD FMedical [facility] Center[facility]ulmonary [facility] Sleep medicine

## 2017-09-24 NOTE — Progress Notes (Signed)
Advanced care plan.  Purpose of the Encounter: CODE STATUS  Parties in Attendance: Patient and her daughter  Patient's Decision Capacity: Intact  Subjective/Patient's story: Patient is a 84 year old white female with history of COPD presenting with shortness of breath cough   Objective/Medical story I discussed with the patient regarding intubation  CPR and resuscitation efforts. Patient wants to be a full code  Goals of care determination:  Full code   CODE STATUS: Full code   Time spent discussing advanced care planning: 16 minutes

## 2017-09-24 NOTE — Progress Notes (Signed)
"  breaking out " on her Left thigh.  Small bumps on her left upper medial thigh.  Hydroxyzine 25 mg given

## 2017-09-24 NOTE — Progress Notes (Signed)
Patient placed on HFNC due to increased WOB, Dr Allena Katz and Dorna Mai RN aware of change, ABG drawn

## 2017-09-25 LAB — BASIC METABOLIC PANEL
ANION GAP: 8 (ref 5–15)
BUN: 25 mg/dL — ABNORMAL HIGH (ref 6–20)
CALCIUM: 8.8 mg/dL — AB (ref 8.9–10.3)
CO2: 24 mmol/L (ref 22–32)
Chloride: 105 mmol/L (ref 101–111)
Creatinine, Ser: 1.13 mg/dL — ABNORMAL HIGH (ref 0.44–1.00)
GFR calc Af Amer: 50 mL/min — ABNORMAL LOW (ref >60)
GFR, EST NON AFRICAN AMERICAN: 43 mL/min — AB (ref >60)
GLUCOSE: 285 mg/dL — AB (ref 65–99)
POTASSIUM: 4.3 mmol/L (ref 3.5–5.1)
Sodium: 137 mmol/L (ref 135–145)

## 2017-09-25 LAB — GLUCOSE, CAPILLARY
GLUCOSE-CAPILLARY: 279 mg/dL — AB (ref 65–99)
Glucose-Capillary: 380 mg/dL — ABNORMAL HIGH (ref 65–99)
Glucose-Capillary: 264 mg/dL — ABNORMAL HIGH (ref 65–99)
Glucose-Capillary: 258 mg/dL — ABNORMAL HIGH (ref 65–99)

## 2017-09-25 LAB — CBC
HCT: 34.5 % — ABNORMAL LOW (ref 35.0–47.0)
Hemoglobin: 11.2 g/dL — ABNORMAL LOW (ref 12.0–16.0)
MCH: 27.3 pg (ref 26.0–34.0)
MCHC: 32.5 g/dL (ref 32.0–36.0)
MCV: 84 fL (ref 80.0–100.0)
PLATELETS: 252 10*3/uL (ref 150–440)
RBC: 4.11 MIL/uL (ref 3.80–5.20)
RDW: 17.9 % — AB (ref 11.5–14.5)
WBC: 10.7 10*3/uL (ref 3.6–11.0)

## 2017-09-25 MED ORDER — METOPROLOL SUCCINATE ER 50 MG PO TB24
50.0000 mg | ORAL_TABLET | Freq: Every day | ORAL | Status: DC
  Administered 2017-09-25 – 2017-09-26 (×2): 50 mg via ORAL
  Filled 2017-09-25 (×2): qty 1

## 2017-09-25 MED ORDER — ACETYLCYSTEINE 20 % IN SOLN
600.0000 mg | Freq: Two times a day (BID) | RESPIRATORY_TRACT | Status: AC
Start: 2017-09-25 — End: 2017-09-25
  Administered 2017-09-25: 600 mg via RESPIRATORY_TRACT
  Administered 2017-09-25: 800 mg via RESPIRATORY_TRACT
  Filled 2017-09-25 (×2): qty 4

## 2017-09-25 MED ORDER — METHYLPREDNISOLONE SODIUM SUCC 125 MG IJ SOLR: 60.0000 mg | Freq: Every day | INTRAMUSCULAR | Status: DC

## 2017-09-25 MED ORDER — INSULIN ASPART 100 UNIT/ML ~~LOC~~ SOLN
0.0000 [IU] | Freq: Every day | SUBCUTANEOUS | Status: DC
  Administered 2017-09-25: 3 [IU] via SUBCUTANEOUS
  Filled 2017-09-25: qty 1

## 2017-09-25 MED ORDER — APIXABAN 5 MG PO TABS
5.0000 mg | ORAL_TABLET | Freq: Two times a day (BID) | ORAL | Status: DC
  Administered 2017-09-25 – 2017-09-26 (×2): 5 mg via ORAL
  Filled 2017-09-25 (×2): qty 1

## 2017-09-25 MED ORDER — BUDESONIDE 0.5 MG/2ML IN SUSP
0.5000 mg | Freq: Two times a day (BID) | RESPIRATORY_TRACT | Status: DC
  Administered 2017-09-25 – 2017-09-26 (×2): 0.5 mg via RESPIRATORY_TRACT
  Filled 2017-09-25 (×2): qty 2

## 2017-09-25 MED ORDER — INSULIN ASPART PROT & ASPART (70-30 MIX) 100 UNIT/ML ~~LOC~~ SUSP
20.0000 [IU] | Freq: Two times a day (BID) | SUBCUTANEOUS | Status: DC
  Administered 2017-09-25 – 2017-09-26 (×2): 20 [IU] via SUBCUTANEOUS
  Filled 2017-09-25 (×2): qty 1

## 2017-09-25 MED ORDER — INSULIN ASPART 100 UNIT/ML ~~LOC~~ SOLN
0.0000 [IU] | Freq: Three times a day (TID) | SUBCUTANEOUS | Status: DC
  Administered 2017-09-25: 11 [IU] via SUBCUTANEOUS
  Administered 2017-09-26: 7 [IU] via SUBCUTANEOUS
  Administered 2017-09-26: 4 [IU] via SUBCUTANEOUS
  Filled 2017-09-25 (×3): qty 1

## 2017-09-25 NOTE — Progress Notes (Signed)
[facility] - [place] at [facility]   PATIENT NAME: Peggy [name]    MR#:  [id number]  DATE OF BIRTH:  1929-04-06  SUBJECTIVE:  came in with increasing shortness of breath. Patient was sent in from pulmonary office with shortness of breath feels better. She was placed on high flow nasal cannula oxygen. No drop in stats were noted. Patient was tachycardic per nurse feels better now.  REVIEW OF SYSTEMS:   Review of Systems  Constitutional: Negative for chills, fever and weight loss.  HENT: Negative for ear discharge, ear pain and nosebleeds.   Eyes: Negative for blurred vision, pain and discharge.  Respiratory: Negative for sputum production, shortness of breath, wheezing and stridor.   Cardiovascular: Negative for chest pain, palpitations, orthopnea and PND.  Gastrointestinal: Negative for abdominal pain, diarrhea, nausea and vomiting.  Genitourinary: Negative for frequency and urgency.  Musculoskeletal: Negative for back pain and joint pain.  Neurological: Positive for weakness. Negative for sensory change, speech change and focal weakness.  Psychiatric/Behavioral: Negative for depression and hallucinations. The patient is not nervous/anxious.    Tolerating Diet:yes Tolerating PT: ambulatory uses walker at home  DRUG ALLERGIES:   Allergies  Allergen Reactions  . Diphenhydramine Rash    AGITATION/DELIRIUM  . Gabapentin Itching    And rash  . Metformin Diarrhea  . Oxybutynin Other (See Comments)    dizzy  . Amlodipine Rash  . Ciprofloxacin Rash  . Lisinopril Rash  . Penicillins Rash    Family states was a "long time ago"  . Saxagliptin Rash    VITALS:  Blood pressure (!) 135/54, pulse 93, temperature 97.6 F (36.4 C), temperature source Oral, resp. rate 19, height 5\' 2"  (1.575 m), weight 69 kg (152 lb 1.6 oz), SpO2 95 %.  PHYSICAL EXAMINATION:   Physical Exam  GENERAL:  84 y.o.-year-old patient lying in the bed with no acute distress.  EYES:  Pupils equal, round, reactive to light and accommodation. No scleral icterus. Extraocular muscles intact.  HEENT: Head atraumatic, normocephalic. Oropharynx and nasopharynx clear.  NECK:  Supple, no jugular venous distention. No thyroid enlargement, no tenderness.  LUNGS: distant breath sounds bilaterally, no wheezing, rales, rhonchi. No use of accessory muscles of respiration.  CARDIOVASCULAR: S1, S2 normal. No murmurs, rubs, or gallops.  ABDOMEN: Soft, nontender, nondistended. Bowel sounds present. No organomegaly or mass.  EXTREMITIES: No cyanosis, clubbing or edema b/l.    NEUROLOGIC: Cranial nerves II through XII are intact. No focal Motor or sensory deficits b/l.   PSYCHIATRIC:  patient is alert and oriented x 3.  SKIN: No obvious rash, lesion, or ulcer.   LABORATORY PANEL:  CBC Recent Labs  Lab 09/25/17 0320  WBC 10.7  HGB 11.2*  HCT 34.5*  PLT 252    Chemistries  Recent Labs  Lab 09/25/17 0320  NA 137  K 4.3  CL 105  CO2 24  GLUCOSE 285*  BUN 25*  CREATININE 1.13*  CALCIUM 8.8*   Cardiac Enzymes No results for input(s): TROPONINI in the last 168 hours. RADIOLOGY:  Dg Chest 2 View  Result Date: 09/24/2017 CLINICAL DATA:  Shortness of breath. EXAM: CHEST - 2 VIEW COMPARISON:  CT 09/08/2017.  Chest x-ray 08/09/2017. FINDINGS: Mediastinum and hilar structures normal. Cardiomegaly with normal pulmonary vascularity. No focal infiltrate. Bilateral apical pleural-parenchymal thickening noted consistent with scarring. Previous identified left lower lobe pulmonary nodule best identified by prior CT. IMPRESSION: 1.  Cardiomegaly.  No pulmonary venous congestion. 2. No focal pulmonary infiltrate. Previously  noted pulmonary nodule left lower lobe best identified by prior CT Electronically Signed   By: Maisie Fus  Register   On: 09/24/2017 14:42   ASSESSMENT AND PLAN:  84 year old white female sent from the pulmonologist office for shortness of breath wheezing cough  1.  Shortness  of breath likely due to acute on chronic COPD exacerbation with some concern for possible hypersensitivity pneumonitis Add hypersensitivity pneumonitis panel We will treat with IV steroid - procalcitonin level <0.10-- no indication for antibiotic. Cont Pulmicort, Mucinex and Mucomyst to current regimen -discussed with Dr. Lennette Bihari on the phone. Agrees with above plan -will assess for home oxygen  2.  Diabetes type 2 Continue home insulin and sliding scale insulin  3.  History of atrial fibrillation  continue amiodarone and Eliquis and metoprolol patient is in sinus rhythm.  4.  Hypertension continue metoprolol  5.  CODE STATUS discussed with the patient wants full code   Overall it seems to be improving. Will wean her off the high flow to nasal cannula. Ambulate with walker with nurse. Patient said she was just signed off by physical therapy at home and does not want therapy to see her here.  If continues to show improvement will discharged to home tomorrow. Patient agrees with the plan.  Case discussed with Care Management/Social Worker. Management plans discussed with the patient, family and they are in agreement.  CODE STATUS: full  DVT Prophylaxis: Lovenox  TOTAL TIME TAKING CARE OF THIS PATIENT: 30 minutes.  >50% time spent on counselling and coordination of care  POSSIBLE D/C IN 1-2 DAYS, DEPENDING ON CLINICAL CONDITION.  Note: This dictation was prepared with Dragon dictation along with smaller phrase technology. Any transcriptional errors that result from this process are unintentional.  Enedina Finner M.D on 09/25/2017 at 8:58 AM  Between 7am to 6pm - Pager - [phone]  After 6pm go to [email or web address] - Social research officer, government  [facility]  Office  [phone]  CC: Primary care physician; Lauro Regulus, MDPatient ID: Rexene Edison, female   DOB: 1929-03-27, 84 y.o.   MRN: [id number]

## 2017-09-25 NOTE — Discharge Instructions (Addendum)
Information on my medicine - ELIQUIS® (apixaban) ° °Why was Eliquis® prescribed for you? °Eliquis® was prescribed for you to reduce the risk of a blood clot forming that can cause a stroke if you have a medical condition called atrial fibrillation (a type of irregular heartbeat). ° °What do You need to know about Eliquis® ? °Take your Eliquis® TWICE DAILY - one tablet in the morning and one tablet in the evening with or without food. If you have difficulty swallowing the tablet whole please discuss with your pharmacist how to take the medication safely. ° °Take Eliquis® exactly as prescribed by your doctor and DO NOT stop taking Eliquis® without talking to the doctor who prescribed the medication.  Stopping may increase your risk of developing a stroke.  Refill your prescription before you run out. ° °After discharge, you should have regular check-up appointments with your healthcare provider that is prescribing your Eliquis®.  In the future your dose may need to be changed if your kidney function or weight changes by a significant amount or as you get older. ° °What do you do if you miss a dose? °If you miss a dose, take it as soon as you remember on the same day and resume taking twice daily.  Do not take more than one dose of ELIQUIS at the same time to make up a missed dose. ° °Important Safety Information °A possible side effect of Eliquis® is bleeding. You should call your healthcare provider right away if you experience any of the following: °? Bleeding from an injury or your nose that does not stop. °? Unusual colored urine (red or dark brown) or unusual colored stools (red or [place]). °? Unusual bruising for unknown reasons. °? A serious fall or if you hit your head (even if there is no bleeding). ° °Some medicines may interact with Eliquis® and might increase your risk of bleeding or clotting while on Eliquis®. To help avoid this, consult your healthcare provider or pharmacist prior to using any new  prescription or non-prescription medications, including herbals, vitamins, non-steroidal anti-inflammatory drugs (NSAIDs) and supplements. ° °This website has more information on Eliquis® (apixaban): http://www.eliquis.com/eliquis/home ° °

## 2017-09-25 NOTE — Care Management (Signed)
Discussed during progression of need to assess for home 02.  Informed that earlier this morning patient required HFNC. Primary nurse will perform room air sats. Verbally informed that patient currently on room air. Have reached out to [facility] to determine which services they are currently providing

## 2017-09-26 LAB — GLUCOSE, CAPILLARY
GLUCOSE-CAPILLARY: 234 mg/dL — AB (ref 65–99)
Glucose-Capillary: 182 mg/dL — ABNORMAL HIGH (ref 65–99)

## 2017-09-26 MED ORDER — AMIODARONE HCL 400 MG PO TABS: 200.0000 mg | ORAL_TABLET | Freq: Every day | ORAL | 0 refills | Status: DC

## 2017-09-26 MED ORDER — PREDNISONE 10 MG PO TABS: ORAL_TABLET | ORAL | 0 refills | Status: DC

## 2017-09-26 MED ORDER — GUAIFENESIN-CODEINE 100-10 MG/5ML PO SOLN: 10.0000 mL | Freq: Every day | ORAL | 0 refills | Status: DC

## 2017-09-26 MED ORDER — HUMULIN 70/30 KWIKPEN (70-30) 100 UNIT/ML ~~LOC~~ SUPN: 20.0000 [IU] | PEN_INJECTOR | Freq: Two times a day (BID) | SUBCUTANEOUS | 5 refills | Status: DC

## 2017-09-26 MED ORDER — PREDNISONE 50 MG PO TABS
50.0000 mg | ORAL_TABLET | Freq: Every day | ORAL | Status: DC
  Administered 2017-09-26: 50 mg via ORAL
  Filled 2017-09-26: qty 1

## 2017-09-26 MED ORDER — APIXABAN 5 MG PO TABS: 5.0000 mg | ORAL_TABLET | Freq: Two times a day (BID) | ORAL | 0 refills | Status: DC

## 2017-09-26 NOTE — Discharge Summary (Addendum)
[facility] - [place] at [facility]   PATIENT NAME: Peggy [name]    MR#:  [id number]  DATE OF BIRTH:  [birth year]  DATE OF ADMISSION:  09/24/2017 ADMITTING PHYSICIAN: Enedina Finner, MD  DATE OF DISCHARGE: 09/26/2017  PRIMARY CARE PHYSICIAN: Lauro Regulus, MD    ADMISSION DIAGNOSIS:  acute on chronic resp failure  DISCHARGE DIAGNOSIS:  acute hypoxic respiratory failure secondary to acute on chronic COPD exacerbation--- now on oxygen  SECONDARY DIAGNOSIS:   Past Medical History:  Diagnosis Date  . A-fib (HCC)   . Allergy   . Arthritis   . Diabetes mellitus without complication (HCC)   . GERD (gastroesophageal reflux disease)   . Hyperlipidemia   . Hypertension   . TIA (transient ischemic attack)     HOSPITAL COURSE:   84 year old white female sent from the pulmonologist office for shortness of breath wheezing cough  1.Shortness of breath likely due to acute on chronic COPD exacerbation with some concern for possible hypersensitivity pneumonitis and bilateral apical pleural-parenchymal scarring Add hypersensitivity pneumonitis panel-- results pending - IV steroid---change to oral steroid taper - procalcitonin level <0.10-- no indication for antibiotic.  -Cont Pulmicort, Mucinex and Mucomyst to current regimen -discussed with Dr. Welton Flakes on the phone. Agrees with above plan -patient did qualify for home oxygen,will set it up.  2.Diabetes type 2 Continue home insulin and sliding scale insulin  3.History of atrial fibrillation  continue amiodarone and Eliquis and metoprolol patient is in sinus rhythm.  4.Hypertension continue metoprolol  5.CODE STATUS discussed with the patient wants full code  Discussed at length with patient's daughter Marcelino Duster and [name] regarding discharge plan. They will follow-up with Dr. Lennette Bihari as outpatient.  D/chome CONSULTS OBTAINED:  Treatment Team:  Yevonne Pax, MD  DRUG ALLERGIES:   Allergies   Allergen Reactions  . Diphenhydramine Rash    AGITATION/DELIRIUM  . Gabapentin Itching    And rash  . Metformin Diarrhea  . Oxybutynin Other (See Comments)    dizzy  . Amlodipine Rash  . Ciprofloxacin Rash  . Lisinopril Rash  . Penicillins Rash    Family states was a "long time ago"  . Saxagliptin Rash    DISCHARGE MEDICATIONS:   Allergies as of 09/26/2017      Reactions   Diphenhydramine Rash   AGITATION/DELIRIUM   Gabapentin Itching   And rash   Metformin Diarrhea   Oxybutynin Other (See Comments)   dizzy   Amlodipine Rash   Ciprofloxacin Rash   Lisinopril Rash   Penicillins Rash   Family states was a "long time ago"   Saxagliptin Rash      Medication List    STOP taking these medications   benzonatate 200 MG capsule Commonly known as:  TESSALON   metoprolol tartrate 100 MG tablet Commonly known as:  LOPRESSOR   predniSONE 10 MG (21) Tbpk tablet Commonly known as:  STERAPRED UNI-PAK 21 TAB Replaced by:  predniSONE 10 MG tablet     TAKE these medications   amiodarone 400 MG tablet Commonly known as:  PACERONE Take 0.5 tablets (200 mg total) by mouth daily.   apixaban 5 MG Tabs tablet Commonly known as:  ELIQUIS Take 1 tablet (5 mg total) by mouth 2 (two) times daily. What changed:    medication strength  how much to take   atorvastatin 40 MG tablet Commonly known as:  LIPITOR Take 1 tablet (40 mg total) by mouth daily at 6 PM.   fenofibrate 145 MG  tablet Commonly known as:  TRICOR Take 1 tablet (145 mg total) by mouth daily.   fluticasone 50 MCG/ACT nasal spray Commonly known as:  FLONASE USE ONE SPRAY IN EACH NOSTRIL DAILY   guaiFENesin-codeine 100-10 MG/5ML syrup Take 10 mLs by mouth at bedtime. What changed:    when to take this  reasons to take this   HUMULIN 70/30 KWIKPEN (70-30) 100 UNIT/ML PEN Generic drug:  Insulin Isophane & Regular Human Inject 20 Units into the skin 2 (two) times daily. What changed:  how much to  take   HYDROcodone-acetaminophen 5-325 MG tablet Commonly known as:  NORCO/VICODIN Take 1 tablet by mouth 2 (two) times daily. As needed for chronic low back pain   hydrOXYzine 25 MG tablet Commonly known as:  ATARAX/VISTARIL Take 25 mg by mouth 4 (four) times daily as needed for itching.   ipratropium-albuterol 0.5-2.5 (3) MG/3ML Soln Commonly known as:  DUONEB Take 3 mLs by nebulization every 6 (six) hours as needed.   lactobacillus acidophilus Tabs tablet Take 2 tablets by mouth daily.   levothyroxine 50 MCG tablet Commonly known as:  SYNTHROID, LEVOTHROID Take 1 tablet (50 mcg total) by mouth daily. PATIENT NEEDS TO SCHEDULE OFFICE VISIT FOR FOLLOW UP   metoprolol succinate 50 MG 24 hr tablet Commonly known as:  TOPROL-XL Take 50 mg by mouth daily. Take with or immediately following a meal.   predniSONE 10 MG tablet Commonly known as:  DELTASONE Take 50 mg daily taper by 10 mg daily then stop Replaces:  predniSONE 10 MG (21) Tbpk tablet   VENTOLIN HFA 108 (90 Base) MCG/ACT inhaler Generic drug:  albuterol Inhale 2 puffs into the lungs every 6 (six) hours as needed.   vitamin B-12 1000 MCG tablet Commonly known as:  CYANOCOBALAMIN Take 1,000 mcg by mouth daily.   Vitamin D3 5000 units Tabs Take 5,000 Units by mouth daily.            Durable Medical Equipment  (From admission, onward)        Start     Ordered   09/26/17 [phone]  For home use only DME oxygen  Once    Question Answer Comment  Mode or (Route) Nasal cannula   Liters per Minute 2   Frequency Continuous (stationary and portable oxygen unit needed)   Oxygen conserving device Yes   Oxygen delivery system Gas      09/26/17 0938      If you experience worsening of your admission symptoms, develop shortness of breath, life threatening emergency, suicidal or homicidal thoughts you must seek medical attention immediately by calling 911 or calling your MD immediately  if symptoms less severe.  You  Must read complete instructions/literature along with all the possible adverse reactions/side effects for all the Medicines you take and that have been prescribed to you. Take any new Medicines after you have completely understood and accept all the possible adverse reactions/side effects.   Please note  You were cared for by a hospitalist during your hospital stay. If you have any questions about your discharge medications or the care you received while you were in the hospital after you are discharged, you can call the unit and asked to speak with the hospitalist on call if the hospitalist that took care of you is not available. Once you are discharged, your primary care physician will handle any further medical issues. Please note that NO REFILLS for any discharge medications will be authorized once you are discharged, as  it is imperative that you return to your primary care physician (or establish a relationship with a primary care physician if you do not have one) for your aftercare needs so that they can reassess your need for medications and monitor your lab values. Today   SUBJECTIVE   Overall improving. Some cough  VITAL SIGNS:  Blood pressure (!) 126/55, pulse 69, temperature 97.6 F (36.4 C), temperature source Oral, resp. rate 18, height 5\' 2"  (1.575 m), weight 69 kg (152 lb 1.6 oz), SpO2 97 %.  I/O:    Intake/Output Summary (Last 24 hours) at 09/26/2017 1031 Last data filed at 09/26/2017 1007 Gross per 24 hour  Intake 240 ml  Output 900 ml  Net -660 ml    PHYSICAL EXAMINATION:  GENERAL:  84 y.o.-year-old patient lying in the bed with no acute distress.  EYES: Pupils equal, round, reactive to light and accommodation. No scleral icterus. Extraocular muscles intact.  HEENT: Head atraumatic, normocephalic. Oropharynx and nasopharynx clear.  NECK:  Supple, no jugular venous distention. No thyroid enlargement, no tenderness.  LUNGS: Normal breath sounds bilaterally, no wheezing,  rales,rhonchi or crepitation. No use of accessory muscles of respiration.  CARDIOVASCULAR: S1, S2 normal. No murmurs, rubs, or gallops.  ABDOMEN: Soft, non-tender, non-distended. Bowel sounds present. No organomegaly or mass.  EXTREMITIES: No pedal edema, cyanosis, or clubbing.  NEUROLOGIC: Cranial nerves II through XII are intact. Muscle strength 5/5 in all extremities. Sensation intact. Gait not checked.  PSYCHIATRIC: The patient is alert and oriented x 3.  SKIN: No obvious rash, lesion, or ulcer.   DATA REVIEW:   CBC  Recent Labs  Lab 09/25/17 0320  WBC 10.7  HGB 11.2*  HCT 34.5*  PLT 252    Chemistries  Recent Labs  Lab 09/25/17 0320  NA 137  K 4.3  CL 105  CO2 24  GLUCOSE 285*  BUN 25*  CREATININE 1.13*  CALCIUM 8.8*    Microbiology Results   No results found for this or any previous visit (from the past 240 hour(s)).  RADIOLOGY:  Dg Chest 2 View  Result Date: 09/24/2017 CLINICAL DATA:  Shortness of breath. EXAM: CHEST - 2 VIEW COMPARISON:  CT 09/08/2017.  Chest x-ray 08/09/2017. FINDINGS: Mediastinum and hilar structures normal. Cardiomegaly with normal pulmonary vascularity. No focal infiltrate. Bilateral apical pleural-parenchymal thickening noted consistent with scarring. Previous identified left lower lobe pulmonary nodule best identified by prior CT. IMPRESSION: 1.  Cardiomegaly.  No pulmonary venous congestion. 2. No focal pulmonary infiltrate. Previously noted pulmonary nodule left lower lobe best identified by prior CT Electronically Signed   By: Maisie Fus  Register   On: 09/24/2017 14:42     Management plans discussed with the patient, family and they are in agreement.  CODE STATUS:     Code Status Orders  (From admission, onward)        Start     Ordered   09/24/17 1405  Full code  Continuous     09/24/17 1404    Code Status History    Date Active Date Inactive Code Status Order ID Comments User Context   08/07/2017 1223 08/14/2017 1551 Full Code  [id number]  Arnaldo Natal, MD Inpatient   07/01/2017 1247 07/04/2017 1442 Full Code [id number]  Milagros Loll, MD Inpatient   06/30/2017 2100 07/01/2017 1247 Partial Code [id number]  Altamese Dilling, MD Inpatient   12/09/2016 2128 12/10/2016 2112 DNR [id number]  Milagros Loll, MD ED    [facility] Directive Documentation     Most  Recent Value  Type of [facility] Directive  Living will  Pre-existing out of facility DNR order (yellow form or pink MOST form)  -  "MOST" Form in Place?  -      TOTAL TIME TAKING CARE OF THIS PATIENT: *40* minutes.    Enedina Finner M.D on 09/26/2017 at 10:31 AM  Between 7am to 6pm - Pager - [phone] After 6pm go to [email or web address] - Social research officer, government  [facility] [place] [facility]  Office  [phone]  CC: Primary care physician; Lauro Regulus, MD

## 2017-09-26 NOTE — Progress Notes (Signed)
Iv and telemetry removed, 02 tank and portable tank delivered to beside. Patient escorted via wheelchair by car by staff and family

## 2017-09-26 NOTE — Care Management Note (Signed)
Case Management Note  Patient Details  Name: Peggy [name] MRN: [id number] Date of Birth: 1928/08/10  Subjective/Objective:   Met with patient at bedside. She states she is active with [facility] for RN. She will have resumption of home health [place] with [facility] made aware. Patient qualified for home O2. [facility] to provide, patient agreeable. She has COPD                   Action/Plan: [facility] for RN and home O2  Expected Discharge Date:                  Expected Discharge Plan:  [place]  In-House Referral:     Discharge planning Services  CM Consult  Post Acute Care Choice:  Home Health, Durable Medical Equipment, Resumption of Svcs/PTA Provider Choice offered to:  Patient  DME Arranged:  Oxygen DME Agency:  [place]:  RN [facility] Agency:  [place]  Status of Service:  Completed, signed off  If discussed at [place] of Stay Meetings, dates discussed:    Additional Comments:  Jolly Mango, RN 09/26/2017, 9:24 AM

## 2017-09-26 NOTE — Progress Notes (Signed)
SATURATION QUALIFICATIONS: (This note is used to comply with regulatory documentation for home oxygen)  Patient Saturations on Room Air at Rest =95%  Patient Saturations on Room Air while Ambulating = 83%  Patient Saturations on 2 Liters of oxygen while Ambulating =96%  Please briefly explain why patient needs home oxygen:

## 2017-09-26 NOTE — Progress Notes (Signed)
Peggy [name] to be D/C'd Home per MD order.  Discussed prescriptions and follow up appointments with the patient. Prescriptions electronically submitted, medication list explained in detail. Pt and daughter verbalized understanding.  Allergies as of 09/26/2017      Reactions   Diphenhydramine Rash   AGITATION/DELIRIUM   Gabapentin Itching   And rash   Metformin Diarrhea   Oxybutynin Other (See Comments)   dizzy   Amlodipine Rash   Ciprofloxacin Rash   Lisinopril Rash   Penicillins Rash   Family states was a "long time ago"   Saxagliptin Rash      Medication List    STOP taking these medications   benzonatate 200 MG capsule Commonly known as:  TESSALON   metoprolol tartrate 100 MG tablet Commonly known as:  LOPRESSOR   predniSONE 10 MG (21) Tbpk tablet Commonly known as:  STERAPRED UNI-PAK 21 TAB Replaced by:  predniSONE 10 MG tablet     TAKE these medications   amiodarone 400 MG tablet Commonly known as:  PACERONE Take 0.5 tablets (200 mg total) by mouth daily.   apixaban 5 MG Tabs tablet Commonly known as:  ELIQUIS Take 1 tablet (5 mg total) by mouth 2 (two) times daily. What changed:    medication strength  how much to take   atorvastatin 40 MG tablet Commonly known as:  LIPITOR Take 1 tablet (40 mg total) by mouth daily at 6 PM.   fenofibrate 145 MG tablet Commonly known as:  TRICOR Take 1 tablet (145 mg total) by mouth daily.   fluticasone 50 MCG/ACT nasal spray Commonly known as:  FLONASE USE ONE SPRAY IN EACH NOSTRIL DAILY   guaiFENesin-codeine 100-10 MG/5ML syrup Take 10 mLs by mouth at bedtime. What changed:    when to take this  reasons to take this Notes to patient:  Took at 2am   HUMULIN 70/30 KWIKPEN (70-30) 100 UNIT/ML PEN Generic drug:  Insulin Isophane & Regular Human Inject 20 Units into the skin 2 (two) times daily. What changed:  how much to take   HYDROcodone-acetaminophen 5-325 MG tablet Commonly known as:   NORCO/VICODIN Take 1 tablet by mouth 2 (two) times daily. As needed for chronic low back pain Notes to patient:  Took at 2am   hydrOXYzine 25 MG tablet Commonly known as:  ATARAX/VISTARIL Take 25 mg by mouth 4 (four) times daily as needed for itching.   ipratropium-albuterol 0.5-2.5 (3) MG/3ML Soln Commonly known as:  DUONEB Take 3 mLs by nebulization every 6 (six) hours as needed. Notes to patient:  Took at 9am   lactobacillus acidophilus Tabs tablet Take 2 tablets by mouth daily.   levothyroxine 50 MCG tablet Commonly known as:  SYNTHROID, LEVOTHROID Take 1 tablet (50 mcg total) by mouth daily. PATIENT NEEDS TO SCHEDULE OFFICE VISIT FOR FOLLOW UP   metoprolol succinate 50 MG 24 hr tablet Commonly known as:  TOPROL-XL Take 50 mg by mouth daily. Take with or immediately following a meal.   predniSONE 10 MG tablet Commonly known as:  DELTASONE Take 50 mg daily taper by 10 mg daily then stop Replaces:  predniSONE 10 MG (21) Tbpk tablet   VENTOLIN HFA 108 (90 Base) MCG/ACT inhaler Generic drug:  albuterol Inhale 2 puffs into the lungs every 6 (six) hours as needed.   vitamin B-12 1000 MCG tablet Commonly known as:  CYANOCOBALAMIN Take 1,000 mcg by mouth daily.   Vitamin D3 5000 units Tabs Take 5,000 Units by mouth daily.  Durable Medical Equipment  (From admission, onward)        Start     Ordered   09/26/17 0938  For home use only DME oxygen  Once    Question Answer Comment  Mode or (Route) Nasal cannula   Liters per Minute 2   Frequency Continuous (stationary and portable oxygen unit needed)   Oxygen conserving device Yes   Oxygen delivery system Gas      09/26/17 0938      Vitals:   09/26/17 0815 09/26/17 0900  BP:    Pulse:    Resp:    Temp:    SpO2: 95% 97%    Skin clean, dry and intact without evidence of skin break down, no evidence of skin tears noted. Pt denies pain at this time. No complaints noted. Will remove iv prior to  discharge  An After Visit Summary was printed and given to the patient. Waiting for oxygen to be delivered at bedside  Amye Grego Marylou Flesher

## 2017-09-29 ENCOUNTER — Telehealth: Payer: Self-pay

## 2017-09-29 NOTE — Telephone Encounter (Signed)
[facility] nurse called [phone] that pt O2 86 to 90 she is not feeling good still coughing and shortness of breath as per dr Welton Flakes Nurse advised Pt need to go back to ER ASAP

## 2017-09-30 LAB — HYPERSENSITIVITY PNEUMONITIS
A. Pullulans Abs: NEGATIVE
A.Fumigatus #1 Abs: NEGATIVE
Micropolyspora faeni, IgG: NEGATIVE
Pigeon Serum Abs: NEGATIVE
THERMOACTINOMYCES VULGARIS IGG: NEGATIVE
Thermoact. Saccharii: NEGATIVE

## 2017-09-30 NOTE — Telephone Encounter (Signed)
error 

## 2017-10-06 ENCOUNTER — Ambulatory Visit: Payer: Medicare Other | Admitting: Internal Medicine

## 2017-10-06 ENCOUNTER — Encounter: Payer: Self-pay | Admitting: Internal Medicine

## 2017-10-06 VITALS — BP 142/60 | HR 77 | Resp 16 | Ht 62.0 in | Wt 158.6 lb

## 2017-10-06 DIAGNOSIS — I4891 Unspecified atrial fibrillation: Secondary | ICD-10-CM | POA: Diagnosis not present

## 2017-10-06 DIAGNOSIS — Z9981 Dependence on supplemental oxygen: Secondary | ICD-10-CM | POA: Diagnosis not present

## 2017-10-06 DIAGNOSIS — R05 Cough: Secondary | ICD-10-CM | POA: Diagnosis not present

## 2017-10-06 DIAGNOSIS — J449 Chronic obstructive pulmonary disease, unspecified: Secondary | ICD-10-CM | POA: Diagnosis not present

## 2017-10-06 DIAGNOSIS — R918 Other nonspecific abnormal finding of lung field: Secondary | ICD-10-CM

## 2017-10-06 DIAGNOSIS — R059 Cough, unspecified: Secondary | ICD-10-CM

## 2017-10-06 MED ORDER — BUDESONIDE-FORMOTEROL FUMARATE 80-4.5 MCG/ACT IN AERO: 1.0000 | INHALATION_SPRAY | Freq: Two times a day (BID) | RESPIRATORY_TRACT | 3 refills | Status: DC

## 2017-10-06 MED ORDER — HYDROCOD POLST-CPM POLST ER 10-8 MG/5ML PO SUER: 5.0000 mL | Freq: Two times a day (BID) | ORAL | 0 refills | Status: DC | PRN

## 2017-10-06 MED ORDER — PREDNISONE 10 MG (21) PO TBPK: ORAL_TABLET | ORAL | 0 refills | Status: DC

## 2017-10-06 NOTE — Progress Notes (Signed)
[place], [place] [address]  Pulmonary Sleep Medicine   Office Visit Note  Patient Name: Peggy [name] DOB: 1928/12/10 MRN [id number]  Date of Service: 10/06/2017  Complaints/HPI:  Patient was admitted to the hospital when she last came to the office.  She was discharged after few days.  Overall she states that she has been doing better however still has cough.  She of note has recently been started on amiodarone and the cough in the setting of poor having infiltrates on last CT scan would raise the concern if the amiodarone may be contributing.  In addition this could be hypersensitivity and she did do better when she was on prednisone on discharge from the hospital.  In addition inhalers she is not on any inhaled corticosteroid or long-acting beta agonist combination.  ROS  General: (-) fever, (-) chills, (-) night sweats, (-) weakness Skin: (-) rashes, (-) itching,. Eyes: (-) visual changes, (-) redness, (-) itching. Nose and Sinuses: (-) nasal stuffiness or itchiness, (-) postnasal drip, (-) nosebleeds, (-) sinus trouble. Mouth and Throat: (-) sore throat, (-) hoarseness. Neck: (-) swollen glands, (-) enlarged thyroid, (-) neck pain. Respiratory: + cough, (-) bloody sputum, + shortness of breath, - wheezing. Cardiovascular: - ankle swelling, (-) chest pain. Lymphatic: (-) lymph node enlargement. Neurologic: (-) numbness, (-) tingling. Psychiatric: (-) anxiety, (-) depression   Current Medication: Outpatient Encounter Medications as of 10/06/2017  Medication Sig  . amiodarone (PACERONE) 400 MG tablet Take 0.5 tablets (200 mg total) by mouth daily.  Marland Kitchen apixaban (ELIQUIS) 5 MG TABS tablet Take 1 tablet (5 mg total) by mouth 2 (two) times daily.  Marland Kitchen atorvastatin (LIPITOR) 40 MG tablet Take 1 tablet (40 mg total) by mouth daily at 6 PM.  . Cholecalciferol (VITAMIN D3) 5000 units TABS Take 5,000 Units by mouth daily.  . fenofibrate (TRICOR) 145 MG  tablet Take 1 tablet (145 mg total) by mouth daily.  . fluticasone (FLONASE) 50 MCG/ACT nasal spray USE ONE SPRAY IN EACH NOSTRIL DAILY  . guaiFENesin-codeine 100-10 MG/5ML syrup Take 10 mLs by mouth at bedtime.  Marland Kitchen HUMULIN 70/30 KWIKPEN (70-30) 100 UNIT/ML PEN Inject 20 Units into the skin 2 (two) times daily.  Marland Kitchen HYDROcodone-acetaminophen (NORCO/VICODIN) 5-325 MG tablet Take 1 tablet by mouth 2 (two) times daily. As needed for chronic low back pain  . hydrOXYzine (ATARAX/VISTARIL) 25 MG tablet Take 25 mg by mouth 4 (four) times daily as needed for itching.  Marland Kitchen ipratropium-albuterol (DUONEB) 0.5-2.5 (3) MG/3ML SOLN Take 3 mLs by nebulization every 6 (six) hours as needed.  . lactobacillus acidophilus (BACID) TABS tablet Take 2 tablets by mouth daily.  Marland Kitchen levothyroxine (SYNTHROID, LEVOTHROID) 50 MCG tablet Take 1 tablet (50 mcg total) by mouth daily. PATIENT NEEDS TO SCHEDULE OFFICE VISIT FOR FOLLOW UP  . metoprolol succinate (TOPROL-XL) 50 MG 24 hr tablet Take 50 mg by mouth daily. Take with or immediately following a meal.  . predniSONE (DELTASONE) 10 MG tablet Take 50 mg daily taper by 10 mg daily then stop  . VENTOLIN HFA 108 (90 Base) MCG/ACT inhaler Inhale 2 puffs into the lungs every 6 (six) hours as needed.  . vitamin B-12 (CYANOCOBALAMIN) 1000 MCG tablet Take 1,000 mcg by mouth daily.   No facility-administered encounter medications on file as of 10/06/2017.     Surgical History: Past Surgical History:  Procedure Laterality Date  . ABDOMINAL HYSTERECTOMY  1970  . BACK SURGERY  2013  . colectomy Right  10/08/2013   Dr. Marina Gravel  . CORONARY ANGIOPLASTY WITH STENT PLACEMENT      Medical History: Past Medical History:  Diagnosis Date  . A-fib ([place])   . Allergy   . Arthritis   . Diabetes mellitus without complication ([place])   . GERD (gastroesophageal reflux disease)   . Hyperlipidemia   . Hypertension   . TIA (transient ischemic attack)     Family History: Family History  Problem  Relation Age of Onset  . Breast cancer Mother   . Pancreatitis Father   . Breast cancer Sister   . Lung cancer Brother   . Melanoma Brother   . Throat cancer Brother     Social History: Social History   Socioeconomic History  . Marital status: Widowed    Spouse name: Not on file  . Number of children: 3  . Years of education: H/S  . Highest education level: Not on file  Occupational History  . Occupation: Retired  Scientific laboratory technician  . Financial resource strain: Not on file  . Food insecurity:    Worry: Not on file    Inability: Not on file  . Transportation needs:    Medical: Not on file    Non-medical: Not on file  Tobacco Use  . Smoking status: Former Smoker    Packs/day: 1.00    Years: 30.00    Pack years: 30.00    Last attempt to quit: 11/30/1999    Years since quitting: 17.8  . Smokeless tobacco: Never Used  Substance and Sexual Activity  . Alcohol use: No  . Drug use: No  . Sexual activity: Not on file  Lifestyle  . Physical activity:    Days per week: Not on file    Minutes per session: Not on file  . Stress: Not on file  Relationships  . Social connections:    Talks on phone: Not on file    Gets together: Not on file    Attends religious service: Not on file    Active member of club or organization: Not on file    Attends meetings of clubs or organizations: Not on file    Relationship status: Not on file  . Intimate partner violence:    Fear of current or ex partner: Not on file    Emotionally abused: Not on file    Physically abused: Not on file    Forced sexual activity: Not on file  Other Topics Concern  . Not on file  Social History Narrative  . Not on file    Vital Signs: Blood pressure (!) 142/60, pulse 77, resp. rate 16, height 5' 2"  (1.575 m), weight 158 lb 9.6 oz (71.9 kg), SpO2 98 %.  Examination: General Appearance: The patient is well-developed, well-nourished, and in no distress. Skin: Gross inspection of skin unremarkable. Head:  normocephalic, no gross deformities. Eyes: no gross deformities noted. ENT: ears appear grossly normal no exudates. Neck: Supple. No thyromegaly. No LAD. Respiratory: scattered rhonchi noted. Cardiovascular: Normal S1 and S2 without murmur or rub. Extremities: No cyanosis. pulses are equal. Neurologic: Alert and oriented. No involuntary movements.  LABS: Recent Results (from the past 2160 hour(s))  CBC     Status: Abnormal   Collection Time: 08/03/17  4:10 PM  Result Value Ref Range   WBC 10.0 3.6 - 11.0 K/uL   RBC 5.01 3.80 - 5.20 MIL/uL   Hemoglobin 13.5 12.0 - 16.0 g/dL   HCT 41.5 35.0 - 47.0 %   MCV 82.8  80.0 - 100.0 fL   MCH 27.0 26.0 - 34.0 pg   MCHC 32.6 32.0 - 36.0 g/dL   RDW 16.1 (H) 11.5 - 14.5 %   Platelets 286 150 - 440 K/uL    Comment: Performed at [facility], [address] [place], [place] [address]  Troponin I     Status: Abnormal   Collection Time: 08/03/17  4:10 PM  Result Value Ref Range   Troponin I 0.44 (HH) <0.03 ng/mL    Comment: CRITICAL RESULT CALLED TO, READ BACK BY AND VERIFIED WITH DR Alfred Levins ON 08/03/17 AT 1652 [facility] Performed at [facility], [address] [place], [place] [address]   Comprehensive metabolic panel     Status: Abnormal   Collection Time: 08/03/17  4:10 PM  Result Value Ref Range   Sodium 139 135 - 145 mmol/L   Potassium 4.2 3.5 - 5.1 mmol/L   Chloride 105 101 - 111 mmol/L   CO2 24 22 - 32 mmol/L   Glucose, Bld 179 (H) 65 - 99 mg/dL   BUN 25 (H) 6 - 20 mg/dL   Creatinine, Ser 1.23 (H) 0.44 - 1.00 mg/dL   Calcium 10.0 8.9 - 10.3 mg/dL   Total Protein 7.4 6.5 - 8.1 g/dL   Albumin 4.1 3.5 - 5.0 g/dL   AST 31 15 - 41 U/L   ALT 27 14 - 54 U/L   Alkaline Phosphatase 37 (L) 38 - 126 U/L   Total Bilirubin 0.6 0.3 - 1.2 mg/dL   GFR calc non Af Amer 39 (L) >60 mL/min   GFR calc Af Amer 45 (L) >60 mL/min    Comment: (NOTE) The eGFR has been calculated using the CKD EPI equation. This calculation has not been  validated in all clinical situations. eGFR's persistently <60 mL/min signify possible Chronic Kidney Disease.    Anion gap 10 5 - 15    Comment: Performed at [facility], [address] [place], [place] [address]  Magnesium     Status: None   Collection Time: 08/03/17  4:10 PM  Result Value Ref Range   Magnesium 1.7 1.7 - 2.4 mg/dL    Comment: Performed at [facility], [address] [place], [place] [address]  Basic metabolic panel     Status: Abnormal   Collection Time: 08/07/17 12:17 AM  Result Value Ref Range   Sodium 137 135 - 145 mmol/L   Potassium 5.1 3.5 - 5.1 mmol/L   Chloride 105 101 - 111 mmol/L   CO2 22 22 - 32 mmol/L   Glucose, Bld 246 (H) 65 - 99 mg/dL   BUN 26 (H) 6 - 20 mg/dL   Creatinine, Ser 1.69 (H) 0.44 - 1.00 mg/dL   Calcium 9.4 8.9 - 10.3 mg/dL   GFR calc non Af Amer 26 (L) >60 mL/min   GFR calc Af Amer 30 (L) >60 mL/min    Comment: (NOTE) The eGFR has been calculated using the CKD EPI equation. This calculation has not been validated in all clinical situations. eGFR's persistently <60 mL/min signify possible Chronic Kidney Disease.    Anion gap 10 5 - 15    Comment: Performed at [facility], [address] [place], [place] [address]  CBC     Status: Abnormal   Collection Time: 08/07/17 12:17 AM  Result Value Ref Range   WBC 17.0 (H) 3.6 - 11.0 K/uL   RBC 4.99 3.80 - 5.20 MIL/uL   Hemoglobin 13.3 12.0 -  16.0 g/dL   HCT 41.8 35.0 - 47.0 %   MCV 83.9 80.0 - 100.0 fL   MCH 26.7 26.0 - 34.0 pg   MCHC 31.8 (L) 32.0 - 36.0 g/dL   RDW 16.2 (H) 11.5 - 14.5 %   Platelets 416 150 - 440 K/uL    Comment: Performed at [facility], [address] [place], [place] [address]  Troponin I     Status: Abnormal   Collection Time: 08/07/17 12:17 AM  Result Value Ref Range   Troponin I 0.38 (HH) <0.03 ng/mL    Comment: CRITICAL RESULT CALLED TO, READ BACK BY AND VERIFIED WITH DEIJA SCOTT ON 08/07/17 AT [place]  JAG Performed at [facility], [address] [place], [place] [address]   Blood gas, venous     Status: Abnormal   Collection Time: 08/07/17 12:20 AM  Result Value Ref Range   FIO2 0.60    Delivery systems BILEVEL POSITIVE AIRWAY PRESSURE    pH, Ven 7.15 (LL) 7.250 - 7.430    Comment: CRITICAL RESULT CALLED TO, READ BACK BY AND VERIFIED WITH: DR.R.BROWN AT 0030 ON 08/07/17 KSL    pCO2, Ven 70 (H) 44.0 - 60.0 mmHg   pO2, Ven 136.0 (H) 32.0 - 45.0 mmHg   Bicarbonate 24.4 20.0 - 28.0 mmol/L   Acid-base deficit 5.9 (H) 0.0 - 2.0 mmol/L   O2 Saturation 98.3 %   Patient temperature 37.0    Collection site VENOUS    Sample type VENOUS     Comment: Performed at [facility], [address] [place], [place] [address]  Blood gas, arterial     Status: Abnormal   Collection Time: 08/07/17  1:43 AM  Result Value Ref Range   FIO2 0.60    Delivery systems BILEVEL POSITIVE AIRWAY PRESSURE    Inspiratory PAP 12    Expiratory PAP 6.0    pH, Arterial 7.28 (L) 7.350 - 7.450   pCO2 arterial 49 (H) 32.0 - 48.0 mmHg   pO2, Arterial 232 (H) 83.0 - 108.0 mmHg   Bicarbonate 23.0 20.0 - 28.0 mmol/L   Acid-base deficit 4.1 (H) 0.0 - 2.0 mmol/L   O2 Saturation 99.8 %   Patient temperature 37.0    Collection site RIGHT RADIAL    Sample type ARTERIAL DRAW    Allens test (pass/fail) PASS PASS   Mechanical Rate 8     Comment: Performed at [facility], [address] [place], [place] [address]  TSH     Status: None   Collection Time: 08/07/17 12:50 PM  Result Value Ref Range   TSH 1.393 0.350 - 4.500 uIU/mL    Comment: Performed by a 3rd Generation assay with a functional sensitivity of <=0.01 uIU/mL. Performed at [facility], [address] [place],  [address]   Hemoglobin A1c     Status: Abnormal   Collection Time: 08/07/17 12:50 PM  Result Value Ref Range   Hgb A1c MFr Bld 8.4 (H) 4.8 - 5.6 %    Comment: (NOTE)         Prediabetes: 5.7 -  6.4         Diabetes: >6.4         Glycemic control for adults with diabetes: <7.0    Mean Plasma Glucose 194 mg/dL    Comment: (NOTE) Performed At: [facility] [place], [place] [id number] Rush Farmer MD [id number] Performed at [facility], [address], [place], [place]  [address]   Troponin I     Status: Abnormal   Collection Time: 08/07/17 12:50 PM  Result Value Ref Range   Troponin I 0.57 (HH) <0.03 ng/mL    Comment: CRITICAL VALUE NOTED. VALUE IS CONSISTENT WITH PREVIOUSLY REPORTED/CALLED VALUE AKT Performed at [facility], [address] [place], [place] [address]   Blood gas, arterial     Status: Abnormal   Collection Time: 08/07/17  2:05 PM  Result Value Ref Range   FIO2 0.30    Delivery systems NASAL CANNULA    pH, Arterial 7.39 7.350 - 7.450   pCO2 arterial 41 32.0 - 48.0 mmHg   pO2, Arterial 61 (L) 83.0 - 108.0 mmHg   Bicarbonate 24.8 20.0 - 28.0 mmol/L   Acid-base deficit 0.2 0.0 - 2.0 mmol/L   O2 Saturation 90.7 %   Patient temperature 37.0    Collection site RIGHT BRACHIAL    Sample type ARTERIAL DRAW    Allens test (pass/fail) ARTERIAL DRAW (A) PASS    Comment: Performed at [facility], [address] [place], [place] [address]  Glucose, capillary     Status: Abnormal   Collection Time: 08/07/17  4:31 PM  Result Value Ref Range   Glucose-Capillary 337 (H) 65 - 99 mg/dL  Troponin I     Status: Abnormal   Collection Time: 08/07/17  6:30 PM  Result Value Ref Range   Troponin I 0.40 (HH) <0.03 ng/mL    Comment: CRITICAL VALUE NOTED. VALUE IS CONSISTENT WITH PREVIOUSLY REPORTED/CALLED VALUE.PMH Performed at [facility], [address] [place], [place] [address]   Glucose, capillary     Status: Abnormal   Collection Time: 08/07/17  8:06 PM  Result Value Ref Range   Glucose-Capillary 323 (H) 65 - 99 mg/dL  Troponin I     Status: Abnormal   Collection Time: 08/08/17 12:55 AM   Result Value Ref Range   Troponin I 0.39 (HH) <0.03 ng/mL    Comment: CRITICAL VALUE NOTED. VALUE IS CONSISTENT WITH PREVIOUSLY REPORTED/CALLED VALUE.PMH Performed at [facility], [address] [place], [place] [address]   Glucose, capillary     Status: Abnormal   Collection Time: 08/08/17  7:36 AM  Result Value Ref Range   Glucose-Capillary 270 (H) 65 - 99 mg/dL  Troponin I (q 6hr x 3)     Status: Abnormal   Collection Time: 08/08/17 11:51 AM  Result Value Ref Range   Troponin I 0.38 (HH) <0.03 ng/mL    Comment: CRITICAL VALUE NOTED. VALUE IS CONSISTENT WITH PREVIOUSLY REPORTED/CALLED VALUE [facility] Performed at [facility], [address] [place], [place] [address]   Glucose, capillary     Status: Abnormal   Collection Time: 08/08/17 12:21 PM  Result Value Ref Range   Glucose-Capillary 253 (H) 65 - 99 mg/dL  Glucose, capillary     Status: Abnormal   Collection Time: 08/08/17  4:37 PM  Result Value Ref Range   Glucose-Capillary 269 (H) 65 - 99 mg/dL  Troponin I (q 6hr x 3)     Status: Abnormal   Collection Time: 08/08/17  5:10 PM  Result Value Ref Range   Troponin I 0.39 (HH) <0.03 ng/mL    Comment: CRITICAL VALUE NOTED. VALUE IS CONSISTENT WITH PREVIOUSLY REPORTED/CALLED VALUE.MSS Performed at [facility], [address] [place], [place] [address]   Glucose, capillary     Status: Abnormal   Collection Time: 08/08/17  9:07 PM  Result Value Ref  Range   Glucose-Capillary 302 (H) 65 - 99 mg/dL  Glucose, capillary     Status: Abnormal   Collection Time: 08/09/17  7:27 AM  Result Value Ref Range   Glucose-Capillary 265 (H) 65 - 99 mg/dL  Glucose, capillary     Status: Abnormal   Collection Time: 08/09/17 12:08 PM  Result Value Ref Range   Glucose-Capillary 226 (H) 65 - 99 mg/dL   Comment 1 Notify RN    Comment 2 Document in Chart   Influenza panel by PCR (type A & B)     Status: None   Collection Time: 08/09/17  2:59 PM  Result Value Ref  Range   Influenza A By PCR NEGATIVE NEGATIVE   Influenza B By PCR NEGATIVE NEGATIVE    Comment: (NOTE) The Xpert Xpress Flu assay is intended as an aid in the diagnosis of  influenza and should not be used as a sole basis for treatment.  This  assay is FDA approved for nasopharyngeal swab specimens only. Nasal  washings and aspirates are unacceptable for Xpert Xpress Flu testing. Performed at [facility], [address] [place], [place] [address]   Glucose, capillary     Status: Abnormal   Collection Time: 08/09/17  4:28 PM  Result Value Ref Range   Glucose-Capillary 226 (H) 65 - 99 mg/dL  Glucose, capillary     Status: Abnormal   Collection Time: 08/09/17  8:51 PM  Result Value Ref Range   Glucose-Capillary 292 (H) 65 - 99 mg/dL  MRSA PCR Screening     Status: None   Collection Time: 08/09/17  9:22 PM  Result Value Ref Range   MRSA by PCR NEGATIVE NEGATIVE    Comment:        The GeneXpert MRSA Assay (FDA approved for NASAL specimens only), is one component of a comprehensive MRSA colonization surveillance program. It is not intended to diagnose MRSA infection nor to guide or monitor treatment for MRSA infections. Performed at [facility], [address] [place], [place] [address]   Blood gas, arterial     Status: Abnormal   Collection Time: 08/09/17 11:41 PM  Result Value Ref Range   FIO2 0.55    Delivery systems VENTURI MASK    pH, Arterial 7.37 7.350 - 7.450   pCO2 arterial 42 32.0 - 48.0 mmHg   pO2, Arterial 56 (L) 83.0 - 108.0 mmHg   Bicarbonate 24.3 20.0 - 28.0 mmol/L   Acid-base deficit 1.1 0.0 - 2.0 mmol/L   O2 Saturation 87.8 %   Patient temperature 37.0    Collection site RIGHT RADIAL    Sample type ARTERIAL DRAW    Allens test (pass/fail) PASS PASS    Comment: Performed at [facility], [address] [place], [place] [address]  Glucose, capillary     Status: Abnormal   Collection Time: 08/10/17  7:31 AM  Result Value  Ref Range   Glucose-Capillary 233 (H) 65 - 99 mg/dL  CBC     Status: Abnormal   Collection Time: 08/10/17 10:04 AM  Result Value Ref Range   WBC 15.6 (H) 3.6 - 11.0 K/uL   RBC 4.45 3.80 - 5.20 MIL/uL   Hemoglobin 12.0 12.0 - 16.0 g/dL   HCT 37.2 35.0 - 47.0 %   MCV 83.7 80.0 - 100.0 fL   MCH 26.9 26.0 - 34.0 pg   MCHC 32.1 32.0 - 36.0 g/dL   RDW 16.7 (H) 11.5 - 14.5 %  Platelets 319 150 - 440 K/uL    Comment: Performed at [facility], [address] [place], [place] [address]  Basic metabolic panel     Status: Abnormal   Collection Time: 08/10/17 10:04 AM  Result Value Ref Range   Sodium 136 135 - 145 mmol/L   Potassium 4.1 3.5 - 5.1 mmol/L   Chloride 102 101 - 111 mmol/L   CO2 25 22 - 32 mmol/L   Glucose, Bld 361 (H) 65 - 99 mg/dL   BUN 44 (H) 6 - 20 mg/dL   Creatinine, Ser 1.10 (H) 0.44 - 1.00 mg/dL   Calcium 8.8 (L) 8.9 - 10.3 mg/dL   GFR calc non Af Amer 44 (L) >60 mL/min   GFR calc Af Amer 51 (L) >60 mL/min    Comment: (NOTE) The eGFR has been calculated using the CKD EPI equation. This calculation has not been validated in all clinical situations. eGFR's persistently <60 mL/min signify possible Chronic Kidney Disease.    Anion gap 9 5 - 15    Comment: Performed at [facility], [address] [place],  [address]  Glucose, capillary     Status: Abnormal   Collection Time: 08/10/17 11:26 AM  Result Value Ref Range   Glucose-Capillary 298 (H) 65 - 99 mg/dL  Glucose, capillary     Status: Abnormal   Collection Time: 08/10/17  5:25 PM  Result Value Ref Range   Glucose-Capillary 183 (H) 65 - 99 mg/dL  Glucose, capillary     Status: Abnormal   Collection Time: 08/10/17  8:59 PM  Result Value Ref Range   Glucose-Capillary 272 (H) 65 - 99 mg/dL  Glucose, capillary     Status: Abnormal   Collection Time: 08/11/17  7:26 AM  Result Value Ref Range   Glucose-Capillary 193 (H) 65 - 99 mg/dL  Glucose, capillary     Status: Abnormal    Collection Time: 08/11/17 11:47 AM  Result Value Ref Range   Glucose-Capillary 345 (H) 65 - 99 mg/dL  Glucose, capillary     Status: Abnormal   Collection Time: 08/11/17  4:35 PM  Result Value Ref Range   Glucose-Capillary 360 (H) 65 - 99 mg/dL  Glucose, capillary     Status: Abnormal   Collection Time: 08/11/17  9:12 PM  Result Value Ref Range   Glucose-Capillary 269 (H) 65 - 99 mg/dL  Glucose, capillary     Status: Abnormal   Collection Time: 08/12/17  7:41 AM  Result Value Ref Range   Glucose-Capillary 219 (H) 65 - 99 mg/dL  Glucose, capillary     Status: Abnormal   Collection Time: 08/12/17 11:47 AM  Result Value Ref Range   Glucose-Capillary 227 (H) 65 - 99 mg/dL  Basic metabolic panel     Status: Abnormal   Collection Time: 08/12/17  1:01 PM  Result Value Ref Range   Sodium 135 135 - 145 mmol/L   Potassium 4.0 3.5 - 5.1 mmol/L   Chloride 97 (L) 101 - 111 mmol/L   CO2 28 22 - 32 mmol/L   Glucose, Bld 238 (H) 65 - 99 mg/dL   BUN 37 (H) 6 - 20 mg/dL   Creatinine, Ser 0.93 0.44 - 1.00 mg/dL   Calcium 8.9 8.9 - 10.3 mg/dL   GFR calc non Af Amer 54 (L) >60 mL/min   GFR calc Af Amer >60 >60 mL/min    Comment: (NOTE) The eGFR has been calculated using the CKD EPI equation. This calculation  has not been validated in all clinical situations. eGFR's persistently <60 mL/min signify possible Chronic Kidney Disease.    Anion gap 10 5 - 15    Comment: Performed at [facility], [place], [place] [address]  Procalcitonin - Baseline     Status: None   Collection Time: 08/12/17  1:01 PM  Result Value Ref Range   Procalcitonin <0.10 ng/mL    Comment:        Interpretation: PCT (Procalcitonin) <= 0.5 ng/mL: Systemic infection (sepsis) is not likely. Local bacterial infection is possible. (NOTE)       Sepsis PCT Algorithm           Lower Respiratory Tract                                      Infection PCT Algorithm    ----------------------------      ----------------------------         PCT < 0.25 ng/mL                PCT < 0.10 ng/mL         Strongly encourage             Strongly discourage   discontinuation of antibiotics    initiation of antibiotics    ----------------------------     -----------------------------       PCT 0.25 - 0.50 ng/mL            PCT 0.10 - 0.25 ng/mL               OR       >80% decrease in PCT            Discourage initiation of                                            antibiotics      Encourage discontinuation           of antibiotics    ----------------------------     -----------------------------         PCT >= 0.50 ng/mL              PCT 0.26 - 0.50 ng/mL               AND        <80% decrease in PCT             Encourage initiation of                                             antibiotics       Encourage continuation           of antibiotics    ----------------------------     -----------------------------        PCT >= 0.50 ng/mL                  PCT > 0.50 ng/mL               AND         increase in PCT  Strongly encourage                                      initiation of antibiotics    Strongly encourage escalation           of antibiotics                                     -----------------------------                                           PCT <= 0.25 ng/mL                                                 OR                                        > 80% decrease in PCT                                     Discontinue / Do not initiate                                             antibiotics Performed at [facility], [address] [place], [place] [address]   Glucose, capillary     Status: Abnormal   Collection Time: 08/12/17  4:43 PM  Result Value Ref Range   Glucose-Capillary 267 (H) 65 - 99 mg/dL  Glucose, capillary     Status: Abnormal   Collection Time: 08/12/17  9:00 PM  Result Value Ref Range   Glucose-Capillary 347 (H) 65 - 99 mg/dL  Culture,  expectorated sputum-assessment     Status: None   Collection Time: 08/13/17  1:03 AM  Result Value Ref Range   Specimen Description SPUTUM    Special Requests Normal    Sputum evaluation      THIS SPECIMEN IS ACCEPTABLE FOR SPUTUM CULTURE Performed at [facility], [address] [place], [place] [address]    Report Status 08/13/2017 FINAL   Culture, respiratory (NON-Expectorated)     Status: None   Collection Time: 08/13/17  1:03 AM  Result Value Ref Range   Specimen Description      SPUTUM Performed at [facility], [address] [place], [place] [address]    Special Requests      Normal Reflexed from [id number] Performed at [facility], [place], [place] [address]    Gram Stain      FEW WBC PRESENT,BOTH PMN AND MONONUCLEAR RARE GRAM POSITIVE COCCI    Culture      Consistent with normal respiratory flora. Performed at [facility], [place] [address] [place], [place] [address]    Report Status 08/15/2017 FINAL   Glucose, capillary  Status: Abnormal   Collection Time: 08/13/17  7:38 AM  Result Value Ref Range   Glucose-Capillary 191 (H) 65 - 99 mg/dL  Procalcitonin - Baseline     Status: None   Collection Time: 08/13/17  9:00 AM  Result Value Ref Range   Procalcitonin <0.10 ng/mL    Comment:        Interpretation: PCT (Procalcitonin) <= 0.5 ng/mL: Systemic infection (sepsis) is not likely. Local bacterial infection is possible. (NOTE)       Sepsis PCT Algorithm           Lower Respiratory Tract                                      Infection PCT Algorithm    ----------------------------     ----------------------------         PCT < 0.25 ng/mL                PCT < 0.10 ng/mL         Strongly encourage             Strongly discourage   discontinuation of antibiotics    initiation of antibiotics    ----------------------------     -----------------------------       PCT 0.25 - 0.50 ng/mL            PCT 0.10 - 0.25  ng/mL               OR       >80% decrease in PCT            Discourage initiation of                                            antibiotics      Encourage discontinuation           of antibiotics    ----------------------------     -----------------------------         PCT >= 0.50 ng/mL              PCT 0.26 - 0.50 ng/mL               AND        <80% decrease in PCT             Encourage initiation of                                             antibiotics       Encourage continuation           of antibiotics    ----------------------------     -----------------------------        PCT >= 0.50 ng/mL                  PCT > 0.50 ng/mL               AND         increase in PCT                  Strongly encourage  initiation of antibiotics    Strongly encourage escalation           of antibiotics                                     -----------------------------                                           PCT <= 0.25 ng/mL                                                 OR                                        > 80% decrease in PCT                                     Discontinue / Do not initiate                                             antibiotics Performed at [facility], [address] [place], [place] [address]   Glucose, capillary     Status: Abnormal   Collection Time: 08/13/17 11:52 AM  Result Value Ref Range   Glucose-Capillary 311 (H) 65 - 99 mg/dL  Glucose, capillary     Status: Abnormal   Collection Time: 08/13/17  4:19 PM  Result Value Ref Range   Glucose-Capillary 312 (H) 65 - 99 mg/dL  Glucose, capillary     Status: Abnormal   Collection Time: 08/13/17  9:46 PM  Result Value Ref Range   Glucose-Capillary 356 (H) 65 - 99 mg/dL   Comment 1 Notify RN   Glucose, capillary     Status: None   Collection Time: 08/14/17  7:40 AM  Result Value Ref Range   Glucose-Capillary 77 65 - 99 mg/dL  CBC with Differential/Platelet      Status: Abnormal   Collection Time: 09/24/17  2:00 PM  Result Value Ref Range   WBC 11.1 (H) 3.6 - 11.0 K/uL   RBC 4.34 3.80 - 5.20 MIL/uL   Hemoglobin 11.8 (L) 12.0 - 16.0 g/dL   HCT 36.4 35.0 - 47.0 %   MCV 83.8 80.0 - 100.0 fL   MCH 27.1 26.0 - 34.0 pg   MCHC 32.3 32.0 - 36.0 g/dL   RDW 17.8 (H) 11.5 - 14.5 %   Platelets 301 150 - 440 K/uL   Neutrophils Relative % 66 %   Neutro Abs 7.2 (H) 1.4 - 6.5 K/uL   Lymphocytes Relative 12 %   Lymphs Abs 1.4 1.0 - 3.6 K/uL   Monocytes Relative 7 %   Monocytes Absolute 0.8 0.2 - 0.9 K/uL   Eosinophils Relative 14 %   Eosinophils Absolute 1.6 (H) 0 - 0.7 K/uL   Basophils Relative 1 %   Basophils Absolute 0.1 0 -  0.1 K/uL    Comment: Performed at [facility], [address] [place], [place] [address]  Basic metabolic panel     Status: Abnormal   Collection Time: 09/24/17  2:00 PM  Result Value Ref Range   Sodium 139 135 - 145 mmol/L   Potassium 4.3 3.5 - 5.1 mmol/L   Chloride 106 101 - 111 mmol/L   CO2 23 22 - 32 mmol/L   Glucose, Bld 161 (H) 65 - 99 mg/dL   BUN 19 6 - 20 mg/dL   Creatinine, Ser 0.98 0.44 - 1.00 mg/dL   Calcium 8.9 8.9 - 10.3 mg/dL   GFR calc non Af Amer 51 (L) >60 mL/min   GFR calc Af Amer 59 (L) >60 mL/min    Comment: (NOTE) The eGFR has been calculated using the CKD EPI equation. This calculation has not been validated in all clinical situations. eGFR's persistently <60 mL/min signify possible Chronic Kidney Disease.    Anion gap 10 5 - 15    Comment: Performed at [facility], [address] [place], [place] [address]  Procalcitonin - Baseline     Status: None   Collection Time: 09/24/17  2:59 PM  Result Value Ref Range   Procalcitonin <0.10 ng/mL    Comment:        Interpretation: PCT (Procalcitonin) <= 0.5 ng/mL: Systemic infection (sepsis) is not likely. Local bacterial infection is possible. (NOTE)       Sepsis PCT Algorithm           Lower Respiratory Tract                                       Infection PCT Algorithm    ----------------------------     ----------------------------         PCT < 0.25 ng/mL                PCT < 0.10 ng/mL         Strongly encourage             Strongly discourage   discontinuation of antibiotics    initiation of antibiotics    ----------------------------     -----------------------------       PCT 0.25 - 0.50 ng/mL            PCT 0.10 - 0.25 ng/mL               OR       >80% decrease in PCT            Discourage initiation of                                            antibiotics      Encourage discontinuation           of antibiotics    ----------------------------     -----------------------------         PCT >= 0.50 ng/mL              PCT 0.26 - 0.50 ng/mL               AND        <80% decrease in PCT             Encourage initiation  of                                             antibiotics       Encourage continuation           of antibiotics    ----------------------------     -----------------------------        PCT >= 0.50 ng/mL                  PCT > 0.50 ng/mL               AND         increase in PCT                  Strongly encourage                                      initiation of antibiotics    Strongly encourage escalation           of antibiotics                                     -----------------------------                                           PCT <= 0.25 ng/mL                                                 OR                                        > 80% decrease in PCT                                     Discontinue / Do not initiate                                             antibiotics Performed at [facility], [address] [place],  [address]   Glucose, capillary     Status: Abnormal   Collection Time: 09/24/17  4:25 PM  Result Value Ref Range   Glucose-Capillary 132 (H) 65 - 99 mg/dL  Blood gas, arterial     Status: None   Collection Time: 09/24/17  4:47  PM  Result Value Ref Range   FIO2 0.28    Delivery systems NASAL CANNULA    pH, Arterial 7.40 7.350 - 7.450   pCO2 arterial 41 32.0 - 48.0 mmHg   pO2, Arterial 99 83.0 - 108.0 mmHg   Bicarbonate 25.4 20.0 - 28.0 mmol/L   Acid-Base Excess 0.5 0.0 - 2.0 mmol/L   O2 Saturation 97.7 %  Patient temperature 37.0    Collection site LEFT RADIAL    Sample type ARTERIAL DRAW    Allens test (pass/fail) PASS PASS    Comment: Performed at [facility], [address] [place], [place] [address]  Glucose, capillary     Status: Abnormal   Collection Time: 09/24/17  8:51 PM  Result Value Ref Range   Glucose-Capillary 152 (H) 65 - 99 mg/dL  CBC     Status: Abnormal   Collection Time: 09/25/17  3:20 AM  Result Value Ref Range   WBC 10.7 3.6 - 11.0 K/uL   RBC 4.11 3.80 - 5.20 MIL/uL   Hemoglobin 11.2 (L) 12.0 - 16.0 g/dL   HCT 34.5 (L) 35.0 - 47.0 %   MCV 84.0 80.0 - 100.0 fL   MCH 27.3 26.0 - 34.0 pg   MCHC 32.5 32.0 - 36.0 g/dL   RDW 17.9 (H) 11.5 - 14.5 %   Platelets 252 150 - 440 K/uL    Comment: Performed at [facility], [address] [place], [place] [address]  Basic metabolic panel     Status: Abnormal   Collection Time: 09/25/17  3:20 AM  Result Value Ref Range   Sodium 137 135 - 145 mmol/L   Potassium 4.3 3.5 - 5.1 mmol/L   Chloride 105 101 - 111 mmol/L   CO2 24 22 - 32 mmol/L   Glucose, Bld 285 (H) 65 - 99 mg/dL   BUN 25 (H) 6 - 20 mg/dL   Creatinine, Ser 1.13 (H) 0.44 - 1.00 mg/dL   Calcium 8.8 (L) 8.9 - 10.3 mg/dL   GFR calc non Af Amer 43 (L) >60 mL/min   GFR calc Af Amer 50 (L) >60 mL/min    Comment: (NOTE) The eGFR has been calculated using the CKD EPI equation. This calculation has not been validated in all clinical situations. eGFR's persistently <60 mL/min signify possible Chronic Kidney Disease.    Anion gap 8 5 - 15    Comment: Performed at [facility], [address] [place], [place] [address]  Hypersensitivity Pneumonitis      Status: None   Collection Time: 09/25/17  3:20 AM  Result Value Ref Range   A.Fumigatus #1 Abs Negative Negative   Micropolyspora faeni, IgG Negative Negative   Thermoactinomyces vulgaris, IgG Negative Negative   A. Pullulans Abs Negative Negative   Thermoact. Saccharii Negative Negative   Pigeon Serum Abs Negative Negative    Comment: (NOTE) Performed At: [facility] [place], [place] [id number] Rush Farmer MD [id number] Performed at [facility], [address] [place], [place] [address]   Glucose, capillary     Status: Abnormal   Collection Time: 09/25/17  7:40 AM  Result Value Ref Range   Glucose-Capillary 279 (H) 65 - 99 mg/dL  Glucose, capillary     Status: Abnormal   Collection Time: 09/25/17 11:24 AM  Result Value Ref Range   Glucose-Capillary 380 (H) 65 - 99 mg/dL  Glucose, capillary     Status: Abnormal   Collection Time: 09/25/17  4:13 PM  Result Value Ref Range   Glucose-Capillary 264 (H) 65 - 99 mg/dL  Glucose, capillary     Status: Abnormal   Collection Time: 09/25/17  8:31 PM  Result Value Ref Range   Glucose-Capillary 258 (H) 65 - 99 mg/dL   Comment 1 Notify RN    Comment 2 Document in Chart   Glucose, capillary     Status:  Abnormal   Collection Time: 09/26/17  7:42 AM  Result Value Ref Range   Glucose-Capillary 182 (H) 65 - 99 mg/dL  Glucose, capillary     Status: Abnormal   Collection Time: 09/26/17 11:52 AM  Result Value Ref Range   Glucose-Capillary 234 (H) 65 - 99 mg/dL    Radiology: Dg Chest 2 View  Result Date: 09/24/2017 CLINICAL DATA:  Shortness of breath. EXAM: CHEST - 2 VIEW COMPARISON:  CT 09/08/2017.  Chest x-ray 08/09/2017. FINDINGS: Mediastinum and hilar structures normal. Cardiomegaly with normal pulmonary vascularity. No focal infiltrate. Bilateral apical pleural-parenchymal thickening noted consistent with scarring. Previous identified left lower lobe pulmonary nodule best identified by prior CT.  IMPRESSION: 1.  Cardiomegaly.  No pulmonary venous congestion. 2. No focal pulmonary infiltrate. Previously noted pulmonary nodule left lower lobe best identified by prior CT Electronically Signed   By: Ricardo   On: 09/24/2017 14:42    No results found.  Dg Chest 2 View  Result Date: 09/24/2017 CLINICAL DATA:  Shortness of breath. EXAM: CHEST - 2 VIEW COMPARISON:  CT 09/08/2017.  Chest x-ray 08/09/2017. FINDINGS: Mediastinum and hilar structures normal. Cardiomegaly with normal pulmonary vascularity. No focal infiltrate. Bilateral apical pleural-parenchymal thickening noted consistent with scarring. Previous identified left lower lobe pulmonary nodule best identified by prior CT. IMPRESSION: 1.  Cardiomegaly.  No pulmonary venous congestion. 2. No focal pulmonary infiltrate. Previously noted pulmonary nodule left lower lobe best identified by prior CT Electronically Signed   By: Marcello Moores  Register   On: 09/24/2017 14:42   Ct Chest W Contrast  Result Date: 09/08/2017 CLINICAL DATA:  83 year old female with difficulty breathing worse when lying down. Subsequent encounter. EXAM: CT CHEST WITH CONTRAST TECHNIQUE: Multidetector CT imaging of the chest was performed during intravenous contrast administration. CONTRAST:  44m ISOVUE-300 IOPAMIDOL (ISOVUE-300) INJECTION 61% COMPARISON:  08/12/2017, 08/07/2017 and 10/07/2013 chest CT. FINDINGS: Cardiovascular: No central pulmonary embolus. Prominent main pulmonary arteries greater on the right. Question component of pulmonary hypertension. Atherosclerotic changes thoracic aorta without aneurysm or dissection. Heart size top-normal.  Prominent coronary artery calcifications. Mediastinum/Nodes: Scattered normal/top-normal size mediastinal lymph nodes. Lungs/Pleura: Interval clearing of left lower lobe collapse/atelectasis. Fluctuating ground-glass opacities now most notable within the superior aspect of the right lower lobe and the superior aspect of right  upper lobe. Question possibility of hypersensitivity pneumonitis. 8 mm pulmonary nodule superior aspect left lower lobe unchanged from 2015 and therefore most likely benign. Upper Abdomen: No worrisome abnormality. Musculoskeletal: No worrisome osseous lesion. IMPRESSION: Interval clearing of left lower lobe collapse/atelectasis. Fluctuating ground-glass opacities now most notable within the superior aspect of the right lower lobe and the superior aspect of right upper lobe. Question possibility of hypersensitivity pneumonitis. 8 mm pulmonary nodule superior aspect left lower lobe unchanged from 2015 and therefore most likely benign. No central pulmonary embolus. Prominence of right and left main pulmonary artery raise possibility of component of underlying pulmonary hypertension. Prominent coronary artery calcifications. Aortic Atherosclerosis (ICD10-I70.0). Electronically Signed   By: SGenia DelM.D.   On: 09/08/2017 19:25      Assessment and Plan: Patient Active Problem List   Diagnosis Date Noted  . SOB (shortness of breath) 09/24/2017  . History of stroke 08/17/2017  . Acute respiratory failure with hypoxia and hypercarbia ([place] 08/07/2017  . Respiratory failure ([place] 08/07/2017  . History of GI bleed 07/07/2017  . Atrial fibrillation with RVR ([place] 06/30/2017  . Bilateral carotid artery disease ([place] 04/14/2017  . Healthcare maintenance 04/14/2017  .  Dizziness 04/07/2017  . 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 TIA (transient ischemic attack) 12/10/2016  . CVA (cerebral vascular accident) ([place]) 12/09/2016  . Bilateral arm pain 11/27/2016  . Myocardial infarction ([place]) 07/30/2015  . Urinary frequency 02/09/2015  . RUQ abdominal pain 01/26/2015  . Shortness of breath 01/26/2015  . Anxiety 01/26/2015  . Chronic pruritus 12/21/2014  . Cerebral vascular accident ([place]) 11/16/2014  . Chronic low back pain 11/16/2014  . Adult hypothyroidism 11/15/2014  . Allergic  rhinitis 11/15/2014  . Body mass index (BMI) of 28.0-28.9 in adult 11/15/2014  . Arthritis 11/15/2014  . Cramps of lower extremity 11/15/2014  . Essential (primary) hypertension 11/15/2014  . Acid reflux 11/15/2014  . Hypercholesteremia 11/15/2014  . Cannot sleep 11/15/2014  . Psoriasis 11/15/2014  . Itch of skin 11/15/2014  . Restless leg 11/15/2014  . Diabetes mellitus, type 2 ([place]) 11/15/2014  . Breath shortness 11/15/2014  . Dermatitis due to unknown cause 04/10/2014  . Arteriosclerosis of coronary artery 02/20/2014  . AF (paroxysmal atrial fibrillation) ([place]) 02/20/2014  . Temporary cerebral vascular dysfunction 02/20/2014  . DD (diverticular disease) 10/05/2013    1. COPD will continue with her nebs and also will add symbicort  Follow in office next week 2. Cough she feels a little tickle in her throat prior to the cough bouts. ?upper respiratory or sinus issues may need to further investigate this if nothing else  Is 2040 3. Abnormal CT scan will get a follow up CT scan to see if there has been an improvement. If not then will get her on prednisone. She has not had any major sputum production. She is also on amiodarone and I would be concerned that this may also be contributing to her lung issues 4. Oxygen dependent she was sent on oxygen and this will be continued 5. Atrial Fibrillation I would hold the amiodarone and also would suggest continue with the metoprolo  General Counseling: I have discussed the findings of the evaluation and examination with [facility].  I have also discussed any further diagnostic evaluation thatmay be needed or ordered today. Sudiksha verbalizes understanding of the findings of todays visit. We also reviewed her medications today and discussed drug interactions and side effects including but not limited excessive drowsiness and altered mental states. We also discussed that there is always a risk not just to her but also people around her. she has been encouraged to  call the office with any questions or concerns that should arise related to todays visit.    Time spent: 66mn  I have personally obtained a history, examined the patient, evaluated laboratory and imaging results, formulated the assessment and plan and placed orders.    SAllyne Gee MD [facility]Union [facility] HospitalP[facility] Sleep [facility]

## 2017-10-06 NOTE — Patient Instructions (Signed)
CT Angiogram A CT angiogram is a procedure to look at the blood vessels in various areas of the body. For this procedure, a large X-ray machine, called a CT scanner, takes detailed pictures of blood vessels that have been injected with a dye (contrast material). A CT angiogram allows your health care provider to see how well blood is flowing to the area of your body that is being checked. Your health care provider will be able to see if there are any problems, such as a blockage. Tell a health care provider about:  Any allergies you have.  All medicines you are taking, including vitamins, herbs, eye drops, creams, and over-the-counter medicines.  Any problems you or family members have had with anesthetic medicines.  Any blood disorders you have.  Any surgeries you have had.  Any medical conditions you have.  Whether you are pregnant or may be pregnant.  Whether you are breastfeeding.  Any anxiety disorders, chronic pain, or other conditions you have that may increase your stress or prevent you from lying still. What are the risks? Generally, this is a safe procedure. However, problems may occur, including:  Infection.  Bleeding.  Allergic reactions to medicines or dyes.  Damage to other structures or organs.  Kidney damage from the dye or contrast that is used.  Increased risk of cancer from radiation exposure. This risk is low. Talk with your health care provider about: ? The risks and benefits of testing. ? How you can receive the lowest dose of radiation.  What happens before the procedure?  Wear comfortable clothing and remove any jewelry.  Follow instructions from your health care provider about eating and drinking. For most people, instructions may include these actions: ? For 12 hours before the test, avoid caffeine. This includes tea, coffee, soda, and energy drinks or pills. ? For 3-4 hours before the test, stop eating or drinking anything but water. ? Stay  well hydrated by continuing to drink water before the exam. This will help to clear the contrast dye from your body after the test.  Ask your health care provider about changing or stopping your regular medicines. This is especially important if you are taking diabetes medicines or blood thinners. What happens during the procedure?  An IV tube will be inserted into one of your veins.  You will be asked to lie on an exam table. This table will slide in and out of the CT machine during the procedure.  Contrast dye will be injected into the IV tube. You might feel warm, or you may get a metallic taste in your mouth.  The table that you are lying on will move into the CT machine tunnel for the scan.  The person running the machine will give you instructions while the scans are being done. You may be asked to: ? Keep your arms above your head. ? Hold your breath. ? Stay very still, even if the table is moving.  When the scanning is complete, you will be moved out of the machine.  The IV tube will be removed. The procedure may vary among health care providers and hospitals. What happens after the procedure?  You might feel warm, or you may get a metallic taste in your mouth.  You may be asked to drink water or other fluids to wash (flush) the contrast material out of your body.  It is up to you to get the results of your procedure. Ask your health care provider, or the department   that is doing the procedure, when your results will be ready. Summary  A CT angiogram is a procedure to look at the blood vessels in various areas of the body.  You will need to stay very still during the exam.  You may be asked to drink water or other fluids to wash (flush) the contrast material out of your body after your scan. This information is not intended to replace advice given to you by your health care provider. Make sure you discuss any questions you have with your health care provider. Document  Released: 01/17/2016 Document Revised: 01/17/2016 Document Reviewed: 01/17/2016 Elsevier Interactive Patient Education  2018 [employer]  

## 2017-10-07 ENCOUNTER — Telehealth: Payer: Self-pay

## 2017-10-07 ENCOUNTER — Ambulatory Visit: Payer: Self-pay | Admitting: Internal Medicine

## 2017-10-07 NOTE — Telephone Encounter (Signed)
LMOM TO PT DAUGHTER DR KHAN DID SEND TUSSIONEX TO [facility]

## 2017-10-08 DIAGNOSIS — J410 Simple chronic bronchitis: Secondary | ICD-10-CM | POA: Insufficient documentation

## 2017-10-08 DIAGNOSIS — J9611 Chronic respiratory failure with hypoxia: Secondary | ICD-10-CM | POA: Insufficient documentation

## 2017-10-13 ENCOUNTER — Ambulatory Visit: Admission: RE | Admit: 2017-10-13 | Payer: Medicare Other | Source: Ambulatory Visit

## 2017-10-15 ENCOUNTER — Ambulatory Visit: Payer: Medicare Other | Admitting: Internal Medicine

## 2017-10-15 ENCOUNTER — Encounter: Payer: Self-pay | Admitting: Internal Medicine

## 2017-10-15 VITALS — BP 140/50 | HR 74 | Resp 16 | Ht 62.0 in | Wt 156.8 lb

## 2017-10-15 DIAGNOSIS — R918 Other nonspecific abnormal finding of lung field: Secondary | ICD-10-CM

## 2017-10-15 DIAGNOSIS — I482 Chronic atrial fibrillation, unspecified: Secondary | ICD-10-CM

## 2017-10-15 DIAGNOSIS — Z9981 Dependence on supplemental oxygen: Secondary | ICD-10-CM

## 2017-10-15 DIAGNOSIS — J9611 Chronic respiratory failure with hypoxia: Secondary | ICD-10-CM | POA: Diagnosis not present

## 2017-10-15 DIAGNOSIS — J449 Chronic obstructive pulmonary disease, unspecified: Secondary | ICD-10-CM | POA: Diagnosis not present

## 2017-10-15 DIAGNOSIS — R05 Cough: Secondary | ICD-10-CM

## 2017-10-15 DIAGNOSIS — R059 Cough, unspecified: Secondary | ICD-10-CM

## 2017-10-15 NOTE — Progress Notes (Signed)
[facility] [place], [place] [address]  Pulmonary Sleep Medicine   Office Visit Note  Patient Name: Peggy [name] DOB: 10-Oct-1928 MRN [id number]  Date of Service: 10/15/2017  Complaints/HPI:  Patient is doing better she states her cough has actually improved she stopped taking the amiodarone and this is helped her breathing.  Insurance refused to let us do a follow-up CT scan but clinically she is actually doing better now so I will hold off for a few months.  She also was noted to have a rhythm which is more regular so I am going to get an EKG on her.  She denies having any chest pain no palpitations.  Her daughter who was present in the room notified me that she let the cardiologist now had she is currently off of the amiodarone.  Patient denies any fevers no chills.  ROS  General: (-) fever, (-) chills, (-) night sweats, (-) weakness Skin: (-) rashes, (-) itching,. Eyes: (-) visual changes, (-) redness, (-) itching. Nose and Sinuses: (-) nasal stuffiness or itchiness, (-) postnasal drip, (-) nosebleeds, (-) sinus trouble. Mouth and Throat: (-) sore throat, (-) hoarseness. Neck: (-) swollen glands, (-) enlarged thyroid, (-) neck pain. Respiratory: - cough, (-) bloody sputum, + shortness of breath, - wheezing. Cardiovascular: - ankle swelling, (-) chest pain. Lymphatic: (-) lymph node enlargement. Neurologic: (-) numbness, (-) tingling. Psychiatric: (-) anxiety, (-) depression   Current Medication: Outpatient Encounter Medications as of 10/15/2017  Medication Sig  . amiodarone (PACERONE) 400 MG tablet Take 0.5 tablets (200 mg total) by mouth daily.  Marland Kitchen apixaban (ELIQUIS) 5 MG TABS tablet Take 1 tablet (5 mg total) by mouth 2 (two) times daily.  Marland Kitchen atorvastatin (LIPITOR) 40 MG tablet Take 1 tablet (40 mg total) by mouth daily at 6 PM.  . budesonide-formoterol (SYMBICORT) 80-4.5 MCG/ACT inhaler Inhale 1 puff into the lungs 2 (two) times daily.  .  chlorpheniramine-HYDROcodone (TUSSIONEX PENNKINETIC ER) 10-8 MG/5ML SUER Take 5 mLs by mouth every 12 (twelve) hours as needed for cough.  . Cholecalciferol (VITAMIN D3) 5000 units TABS Take 5,000 Units by mouth daily.  . fenofibrate (TRICOR) 145 MG tablet Take 1 tablet (145 mg total) by mouth daily.  . fluticasone (FLONASE) 50 MCG/ACT nasal spray USE ONE SPRAY IN EACH NOSTRIL DAILY  . guaiFENesin-codeine 100-10 MG/5ML syrup Take 10 mLs by mouth at bedtime.  Marland Kitchen HUMULIN 70/30 KWIKPEN (70-30) 100 UNIT/ML PEN Inject 20 Units into the skin 2 (two) times daily.  Marland Kitchen HYDROcodone-acetaminophen (NORCO/VICODIN) 5-325 MG tablet Take 1 tablet by mouth 2 (two) times daily. As needed for chronic low back pain  . hydrOXYzine (ATARAX/VISTARIL) 25 MG tablet Take 25 mg by mouth 4 (four) times daily as needed for itching.  Marland Kitchen ipratropium-albuterol (DUONEB) 0.5-2.5 (3) MG/3ML SOLN Take 3 mLs by nebulization every 6 (six) hours as needed.  . lactobacillus acidophilus (BACID) TABS tablet Take 2 tablets by mouth daily.  Marland Kitchen levothyroxine (SYNTHROID, LEVOTHROID) 50 MCG tablet Take 1 tablet (50 mcg total) by mouth daily. PATIENT NEEDS TO SCHEDULE OFFICE VISIT FOR FOLLOW UP  . metoprolol succinate (TOPROL-XL) 50 MG 24 hr tablet Take 50 mg by mouth daily. Take with or immediately following a meal.  . predniSONE (STERAPRED UNI-PAK 21 TAB) 10 MG (21) TBPK tablet As directed  . VENTOLIN HFA 108 (90 Base) MCG/ACT inhaler Inhale 2 puffs into the lungs every 6 (six) hours as needed.  . vitamin B-12 (CYANOCOBALAMIN) 1000 MCG tablet Take 1,000 mcg  by mouth daily.   No facility-administered encounter medications on file as of 10/15/2017.     Surgical History: Past Surgical History:  Procedure Laterality Date  . ABDOMINAL HYSTERECTOMY  1970  . BACK SURGERY  2013  . colectomy Right 10/08/2013   Dr. Marina Gravel  . CORONARY ANGIOPLASTY WITH STENT PLACEMENT      Medical History: Past Medical History:  Diagnosis Date  . A-fib ([place])   .  Allergy   . Arthritis   . Diabetes mellitus without complication ([place])   . GERD (gastroesophageal reflux disease)   . Hyperlipidemia   . Hypertension   . TIA (transient ischemic attack)     Family History: Family History  Problem Relation Age of Onset  . Breast cancer Mother   . Pancreatitis Father   . Breast cancer Sister   . Lung cancer Brother   . Melanoma Brother   . Throat cancer Brother     Social History: Social History   Socioeconomic History  . Marital status: Widowed    Spouse name: Not on file  . Number of children: 3  . Years of education: H/S  . Highest education level: Not on file  Occupational History  . Occupation: Retired  Scientific laboratory technician  . Financial resource strain: Not on file  . Food insecurity:    Worry: Not on file    Inability: Not on file  . Transportation needs:    Medical: Not on file    Non-medical: Not on file  Tobacco Use  . Smoking status: Former Smoker    Packs/day: 1.00    Years: 30.00    Pack years: 30.00    Last attempt to quit: 11/30/1999    Years since quitting: 17.8  . Smokeless tobacco: Never Used  Substance and Sexual Activity  . Alcohol use: No  . Drug use: No  . Sexual activity: Not on file  Lifestyle  . Physical activity:    Days per week: Not on file    Minutes per session: Not on file  . Stress: Not on file  Relationships  . Social connections:    Talks on phone: Not on file    Gets together: Not on file    Attends religious service: Not on file    Active member of club or organization: Not on file    Attends meetings of clubs or organizations: Not on file    Relationship status: Not on file  . Intimate partner violence:    Fear of current or ex partner: Not on file    Emotionally abused: Not on file    Physically abused: Not on file    Forced sexual activity: Not on file  Other Topics Concern  . Not on file  Social History Narrative  . Not on file    Vital Signs: Blood pressure (!) 140/50, pulse 74,  resp. rate 16, height 5' 2"  (1.575 m), weight 156 lb 12.8 oz (71.1 kg), SpO2 98 %.  Examination: General Appearance: The patient is well-developed, well-nourished, and in no distress. Skin: Gross inspection of skin unremarkable. Head: normocephalic, no gross deformities. Eyes: no gross deformities noted. ENT: ears appear grossly normal no exudates. Neck: Supple. No thyromegaly. No LAD. Respiratory: no rhonchi. Cardiovascular: Normal S1 and S2 without murmur or rub. Extremities: No cyanosis. pulses are equal. Neurologic: Alert and oriented. No involuntary movements.  LABS: Recent Results (from the past 2160 hour(s))  CBC     Status: Abnormal   Collection Time: 08/03/17  4:10  PM  Result Value Ref Range   WBC 10.0 3.6 - 11.0 K/uL   RBC 5.01 3.80 - 5.20 MIL/uL   Hemoglobin 13.5 12.0 - 16.0 g/dL   HCT 41.5 35.0 - 47.0 %   MCV 82.8 80.0 - 100.0 fL   MCH 27.0 26.0 - 34.0 pg   MCHC 32.6 32.0 - 36.0 g/dL   RDW 16.1 (H) 11.5 - 14.5 %   Platelets 286 150 - 440 K/uL    Comment: Performed at [facility], [address] [place], [place] [address]  Troponin I     Status: Abnormal   Collection Time: 08/03/17  4:10 PM  Result Value Ref Range   Troponin I 0.44 (HH) <0.03 ng/mL    Comment: CRITICAL RESULT CALLED TO, READ BACK BY AND VERIFIED WITH DR Alfred Levins ON 08/03/17 AT 1652 [facility] Performed at [facility], [address] [place], [place] [address]   Comprehensive metabolic panel     Status: Abnormal   Collection Time: 08/03/17  4:10 PM  Result Value Ref Range   Sodium 139 135 - 145 mmol/L   Potassium 4.2 3.5 - 5.1 mmol/L   Chloride 105 101 - 111 mmol/L   CO2 24 22 - 32 mmol/L   Glucose, Bld 179 (H) 65 - 99 mg/dL   BUN 25 (H) 6 - 20 mg/dL   Creatinine, Ser 1.23 (H) 0.44 - 1.00 mg/dL   Calcium 10.0 8.9 - 10.3 mg/dL   Total Protein 7.4 6.5 - 8.1 g/dL   Albumin 4.1 3.5 - 5.0 g/dL   AST 31 15 - 41 U/L   ALT 27 14 - 54 U/L   Alkaline Phosphatase 37 (L) 38 - 126 U/L    Total Bilirubin 0.6 0.3 - 1.2 mg/dL   GFR calc non Af Amer 39 (L) >60 mL/min   GFR calc Af Amer 45 (L) >60 mL/min    Comment: (NOTE) The eGFR has been calculated using the CKD EPI equation. This calculation has not been validated in all clinical situations. eGFR's persistently <60 mL/min signify possible Chronic Kidney Disease.    Anion gap 10 5 - 15    Comment: Performed at [facility], [address] [place], [place] [address]  Magnesium     Status: None   Collection Time: 08/03/17  4:10 PM  Result Value Ref Range   Magnesium 1.7 1.7 - 2.4 mg/dL    Comment: Performed at [facility], [address] [place], [place] [address]  Basic metabolic panel     Status: Abnormal   Collection Time: 08/07/17 12:17 AM  Result Value Ref Range   Sodium 137 135 - 145 mmol/L   Potassium 5.1 3.5 - 5.1 mmol/L   Chloride 105 101 - 111 mmol/L   CO2 22 22 - 32 mmol/L   Glucose, Bld 246 (H) 65 - 99 mg/dL   BUN 26 (H) 6 - 20 mg/dL   Creatinine, Ser 1.69 (H) 0.44 - 1.00 mg/dL   Calcium 9.4 8.9 - 10.3 mg/dL   GFR calc non Af Amer 26 (L) >60 mL/min   GFR calc Af Amer 30 (L) >60 mL/min    Comment: (NOTE) The eGFR has been calculated using the CKD EPI equation. This calculation has not been validated in all clinical situations. eGFR's persistently <60 mL/min signify possible Chronic Kidney Disease.    Anion gap 10 5 - 15    Comment: Performed at [facility], [address] [place], [place] [address]  CBC     Status: Abnormal   Collection Time: 08/07/17 12:17 AM  Result Value Ref Range   WBC 17.0 (H) 3.6 - 11.0 K/uL   RBC 4.99 3.80 - 5.20 MIL/uL   Hemoglobin 13.3 12.0 - 16.0 g/dL   HCT 41.8 35.0 - 47.0 %   MCV 83.9 80.0 - 100.0 fL   MCH 26.7 26.0 - 34.0 pg   MCHC 31.8 (L) 32.0 - 36.0 g/dL   RDW 16.2 (H) 11.5 - 14.5 %   Platelets 416 150 - 440 K/uL    Comment: Performed at [facility], [address] [place], [place] [address]  Troponin I      Status: Abnormal   Collection Time: 08/07/17 12:17 AM  Result Value Ref Range   Troponin I 0.38 (HH) <0.03 ng/mL    Comment: CRITICAL RESULT CALLED TO, READ BACK BY AND VERIFIED WITH DEIJA SCOTT ON 08/07/17 AT [place] [facility] Performed at [facility], [address] [place], [place] [address]   Blood gas, venous     Status: Abnormal   Collection Time: 08/07/17 12:20 AM  Result Value Ref Range   FIO2 0.60    Delivery systems BILEVEL POSITIVE AIRWAY PRESSURE    pH, Ven 7.15 (LL) 7.250 - 7.430    Comment: CRITICAL RESULT CALLED TO, READ BACK BY AND VERIFIED WITH: DR.R.BROWN AT 0030 ON 08/07/17 KSL    pCO2, Ven 70 (H) 44.0 - 60.0 mmHg   pO2, Ven 136.0 (H) 32.0 - 45.0 mmHg   Bicarbonate 24.4 20.0 - 28.0 mmol/L   Acid-base deficit 5.9 (H) 0.0 - 2.0 mmol/L   O2 Saturation 98.3 %   Patient temperature 37.0    Collection site VENOUS    Sample type VENOUS     Comment: Performed at [facility], [address] [place], [place] [address]  Blood gas, arterial     Status: Abnormal   Collection Time: 08/07/17  1:43 AM  Result Value Ref Range   FIO2 0.60    Delivery systems BILEVEL POSITIVE AIRWAY PRESSURE    Inspiratory PAP 12    Expiratory PAP 6.0    pH, Arterial 7.28 (L) 7.350 - 7.450   pCO2 arterial 49 (H) 32.0 - 48.0 mmHg   pO2, Arterial 232 (H) 83.0 - 108.0 mmHg   Bicarbonate 23.0 20.0 - 28.0 mmol/L   Acid-base deficit 4.1 (H) 0.0 - 2.0 mmol/L   O2 Saturation 99.8 %   Patient temperature 37.0    Collection site RIGHT RADIAL    Sample type ARTERIAL DRAW    Allens test (pass/fail) PASS PASS   Mechanical Rate 8     Comment: Performed at [facility], [address] [place], [place] [address]  TSH     Status: None   Collection Time: 08/07/17 12:50 PM  Result Value Ref Range   TSH 1.393 0.350 - 4.500 uIU/mL    Comment: Performed by a 3rd Generation assay with a functional sensitivity of <=0.01 uIU/mL. Performed at [facility], [address] [place], [place] [address]   Hemoglobin A1c     Status: Abnormal   Collection Time: 08/07/17 12:50 PM  Result Value Ref Range   Hgb A1c MFr Bld 8.4 (H) 4.8 - 5.6 %    Comment: (NOTE)         Prediabetes: 5.7 - 6.4         Diabetes: >6.4         Glycemic control for adults  with diabetes: <7.0    Mean Plasma Glucose 194 mg/dL    Comment: (NOTE) Performed At: [facility] [place], [place] [id number] Rush Farmer MD OJ:[id number] Performed at [facility], [address] [place], [place] [address]   Troponin I     Status: Abnormal   Collection Time: 08/07/17 12:50 PM  Result Value Ref Range   Troponin I 0.57 (HH) <0.03 ng/mL    Comment: CRITICAL VALUE NOTED. VALUE IS CONSISTENT WITH PREVIOUSLY REPORTED/CALLED VALUE AKT Performed at [facility], [address] [place], [place] [address]   Blood gas, arterial     Status: Abnormal   Collection Time: 08/07/17  2:05 PM  Result Value Ref Range   FIO2 0.30    Delivery systems NASAL CANNULA    pH, Arterial 7.39 7.350 - 7.450   pCO2 arterial 41 32.0 - 48.0 mmHg   pO2, Arterial 61 (L) 83.0 - 108.0 mmHg   Bicarbonate 24.8 20.0 - 28.0 mmol/L   Acid-base deficit 0.2 0.0 - 2.0 mmol/L   O2 Saturation 90.7 %   Patient temperature 37.0    Collection site RIGHT BRACHIAL    Sample type ARTERIAL DRAW    Allens test (pass/fail) ARTERIAL DRAW (A) PASS    Comment: Performed at [facility], [address] [place], [place] [address]  Glucose, capillary     Status: Abnormal   Collection Time: 08/07/17  4:31 PM  Result Value Ref Range   Glucose-Capillary 337 (H) 65 - 99 mg/dL  Troponin I     Status: Abnormal   Collection Time: 08/07/17  6:30 PM  Result Value Ref Range   Troponin I 0.40 (HH) <0.03 ng/mL    Comment: CRITICAL VALUE NOTED. VALUE IS CONSISTENT WITH PREVIOUSLY REPORTED/CALLED VALUE.PMH Performed at [facility], [address] [place], [place] [address]   Glucose,  capillary     Status: Abnormal   Collection Time: 08/07/17  8:06 PM  Result Value Ref Range   Glucose-Capillary 323 (H) 65 - 99 mg/dL  Troponin I     Status: Abnormal   Collection Time: 08/08/17 12:55 AM  Result Value Ref Range   Troponin I 0.39 (HH) <0.03 ng/mL    Comment: CRITICAL VALUE NOTED. VALUE IS CONSISTENT WITH PREVIOUSLY REPORTED/CALLED VALUE.PMH Performed at [facility], [address] [place], [place] [address]   Glucose, capillary     Status: Abnormal   Collection Time: 08/08/17  7:36 AM  Result Value Ref Range   Glucose-Capillary 270 (H) 65 - 99 mg/dL  Troponin I (q 6hr x 3)     Status: Abnormal   Collection Time: 08/08/17 11:51 AM  Result Value Ref Range   Troponin I 0.38 (HH) <0.03 ng/mL    Comment: CRITICAL VALUE NOTED. VALUE IS CONSISTENT WITH PREVIOUSLY REPORTED/CALLED VALUE K[facility] Performed at [facility], [place], [place] [address]   Glucose, capillary     Status: Abnormal   Collection Time: 08/08/17 12:21 PM  Result Value Ref Range   Glucose-Capillary 253 (H) 65 - 99 mg/dL  Glucose, capillary     Status: Abnormal   Collection Time: 08/08/17  4:37 PM  Result Value Ref Range   Glucose-Capillary 269 (H) 65 - 99 mg/dL  Troponin I (q 6hr x 3)     Status: Abnormal   Collection Time: 08/08/17  5:10 PM  Result Value Ref Range   Troponin I 0.39 (HH) <0.03 ng/mL    Comment: CRITICAL VALUE  NOTED. VALUE IS CONSISTENT WITH PREVIOUSLY REPORTED/CALLED VALUE.MSS Performed at [facility], [address] [place], [place] [address]   Glucose, capillary     Status: Abnormal   Collection Time: 08/08/17  9:07 PM  Result Value Ref Range   Glucose-Capillary 302 (H) 65 - 99 mg/dL  Glucose, capillary     Status: Abnormal   Collection Time: 08/09/17  7:27 AM  Result Value Ref Range   Glucose-Capillary 265 (H) 65 - 99 mg/dL  Glucose, capillary     Status: Abnormal   Collection Time: 08/09/17 12:08 PM  Result Value Ref Range    Glucose-Capillary 226 (H) 65 - 99 mg/dL   Comment 1 Notify RN    Comment 2 Document in Chart   Influenza panel by PCR (type A & B)     Status: None   Collection Time: 08/09/17  2:59 PM  Result Value Ref Range   Influenza A By PCR NEGATIVE NEGATIVE   Influenza B By PCR NEGATIVE NEGATIVE    Comment: (NOTE) The Xpert Xpress Flu assay is intended as an aid in the diagnosis of  influenza and should not be used as a sole basis for treatment.  This  assay is FDA approved for nasopharyngeal swab specimens only. Nasal  washings and aspirates are unacceptable for Xpert Xpress Flu testing. Performed at [facility], [address] [place], [place] [address]   Glucose, capillary     Status: Abnormal   Collection Time: 08/09/17  4:28 PM  Result Value Ref Range   Glucose-Capillary 226 (H) 65 - 99 mg/dL  Glucose, capillary     Status: Abnormal   Collection Time: 08/09/17  8:51 PM  Result Value Ref Range   Glucose-Capillary 292 (H) 65 - 99 mg/dL  MRSA PCR Screening     Status: None   Collection Time: 08/09/17  9:22 PM  Result Value Ref Range   MRSA by PCR NEGATIVE NEGATIVE    Comment:        The GeneXpert MRSA Assay (FDA approved for NASAL specimens only), is one component of a comprehensive MRSA colonization surveillance program. It is not intended to diagnose MRSA infection nor to guide or monitor treatment for MRSA infections. Performed at [facility], [address] [place], [place] [address]   Blood gas, arterial     Status: Abnormal   Collection Time: 08/09/17 11:41 PM  Result Value Ref Range   FIO2 0.55    Delivery systems VENTURI MASK    pH, Arterial 7.37 7.350 - 7.450   pCO2 arterial 42 32.0 - 48.0 mmHg   pO2, Arterial 56 (L) 83.0 - 108.0 mmHg   Bicarbonate 24.3 20.0 - 28.0 mmol/L   Acid-base deficit 1.1 0.0 - 2.0 mmol/L   O2 Saturation 87.8 %   Patient temperature 37.0    Collection site RIGHT RADIAL    Sample type ARTERIAL DRAW    Allens test  (pass/fail) PASS PASS    Comment: Performed at [facility], [address] [place], [place] [address]  Glucose, capillary     Status: Abnormal   Collection Time: 08/10/17  7:31 AM  Result Value Ref Range   Glucose-Capillary 233 (H) 65 - 99 mg/dL  CBC     Status: Abnormal   Collection Time: 08/10/17 10:04 AM  Result Value Ref Range   WBC 15.6 (H) 3.6 - 11.0 K/uL   RBC 4.45 3.80 - 5.20 MIL/uL   Hemoglobin 12.0 12.0 - 16.0 g/dL  HCT 37.2 35.0 - 47.0 %   MCV 83.7 80.0 - 100.0 fL   MCH 26.9 26.0 - 34.0 pg   MCHC 32.1 32.0 - 36.0 g/dL   RDW 16.7 (H) 11.5 - 14.5 %   Platelets 319 150 - 440 K/uL    Comment: Performed at [facility], [address] [place], [place] [address]  Basic metabolic panel     Status: Abnormal   Collection Time: 08/10/17 10:04 AM  Result Value Ref Range   Sodium 136 135 - 145 mmol/L   Potassium 4.1 3.5 - 5.1 mmol/L   Chloride 102 101 - 111 mmol/L   CO2 25 22 - 32 mmol/L   Glucose, Bld 361 (H) 65 - 99 mg/dL   BUN 44 (H) 6 - 20 mg/dL   Creatinine, Ser 1.10 (H) 0.44 - 1.00 mg/dL   Calcium 8.8 (L) 8.9 - 10.3 mg/dL   GFR calc non Af Amer 44 (L) >60 mL/min   GFR calc Af Amer 51 (L) >60 mL/min    Comment: (NOTE) The eGFR has been calculated using the CKD EPI equation. This calculation has not been validated in all clinical situations. eGFR's persistently <60 mL/min signify possible Chronic Kidney Disease.    Anion gap 9 5 - 15    Comment: Performed at [facility], [address] [place],  [address]  Glucose, capillary     Status: Abnormal   Collection Time: 08/10/17 11:26 AM  Result Value Ref Range   Glucose-Capillary 298 (H) 65 - 99 mg/dL  Glucose, capillary     Status: Abnormal   Collection Time: 08/10/17  5:25 PM  Result Value Ref Range   Glucose-Capillary 183 (H) 65 - 99 mg/dL  Glucose, capillary     Status: Abnormal   Collection Time: 08/10/17  8:59 PM  Result Value Ref Range   Glucose-Capillary 272 (H) 65 -  99 mg/dL  Glucose, capillary     Status: Abnormal   Collection Time: 08/11/17  7:26 AM  Result Value Ref Range   Glucose-Capillary 193 (H) 65 - 99 mg/dL  Glucose, capillary     Status: Abnormal   Collection Time: 08/11/17 11:47 AM  Result Value Ref Range   Glucose-Capillary 345 (H) 65 - 99 mg/dL  Glucose, capillary     Status: Abnormal   Collection Time: 08/11/17  4:35 PM  Result Value Ref Range   Glucose-Capillary 360 (H) 65 - 99 mg/dL  Glucose, capillary     Status: Abnormal   Collection Time: 08/11/17  9:12 PM  Result Value Ref Range   Glucose-Capillary 269 (H) 65 - 99 mg/dL  Glucose, capillary     Status: Abnormal   Collection Time: 08/12/17  7:41 AM  Result Value Ref Range   Glucose-Capillary 219 (H) 65 - 99 mg/dL  Glucose, capillary     Status: Abnormal   Collection Time: 08/12/17 11:47 AM  Result Value Ref Range   Glucose-Capillary 227 (H) 65 - 99 mg/dL  Basic metabolic panel     Status: Abnormal   Collection Time: 08/12/17  1:01 PM  Result Value Ref Range   Sodium 135 135 - 145 mmol/L   Potassium 4.0 3.5 - 5.1 mmol/L   Chloride 97 (L) 101 - 111 mmol/L   CO2 28 22 - 32 mmol/L   Glucose, Bld 238 (H) 65 - 99 mg/dL   BUN 37 (H) 6 - 20 mg/dL   Creatinine, Ser 0.93 0.44 - 1.00 mg/dL   Calcium 8.9  8.9 - 10.3 mg/dL   GFR calc non Af Amer 54 (L) >60 mL/min   GFR calc Af Amer >60 >60 mL/min    Comment: (NOTE) The eGFR has been calculated using the CKD EPI equation. This calculation has not been validated in all clinical situations. eGFR's persistently <60 mL/min signify possible Chronic Kidney Disease.    Anion gap 10 5 - 15    Comment: Performed at [facility], [place], [place] [address]  Procalcitonin - Baseline     Status: None   Collection Time: 08/12/17  1:01 PM  Result Value Ref Range   Procalcitonin <0.10 ng/mL    Comment:        Interpretation: PCT (Procalcitonin) <= 0.5 ng/mL: Systemic infection (sepsis) is not likely. Local  bacterial infection is possible. (NOTE)       Sepsis PCT Algorithm           Lower Respiratory Tract                                      Infection PCT Algorithm    ----------------------------     ----------------------------         PCT < 0.25 ng/mL                PCT < 0.10 ng/mL         Strongly encourage             Strongly discourage   discontinuation of antibiotics    initiation of antibiotics    ----------------------------     -----------------------------       PCT 0.25 - 0.50 ng/mL            PCT 0.10 - 0.25 ng/mL               OR       >80% decrease in PCT            Discourage initiation of                                            antibiotics      Encourage discontinuation           of antibiotics    ----------------------------     -----------------------------         PCT >= 0.50 ng/mL              PCT 0.26 - 0.50 ng/mL               AND        <80% decrease in PCT             Encourage initiation of                                             antibiotics       Encourage continuation           of antibiotics    ----------------------------     -----------------------------        PCT >= 0.50 ng/mL                  PCT >  0.50 ng/mL               AND         increase in PCT                  Strongly encourage                                      initiation of antibiotics    Strongly encourage escalation           of antibiotics                                     -----------------------------                                           PCT <= 0.25 ng/mL                                                 OR                                        > 80% decrease in PCT                                     Discontinue / Do not initiate                                             antibiotics Performed at [facility], [address] [place], [place] [address]   Glucose, capillary     Status: Abnormal   Collection Time: 08/12/17  4:43 PM  Result Value Ref Range    Glucose-Capillary 267 (H) 65 - 99 mg/dL  Glucose, capillary     Status: Abnormal   Collection Time: 08/12/17  9:00 PM  Result Value Ref Range   Glucose-Capillary 347 (H) 65 - 99 mg/dL  Culture, expectorated sputum-assessment     Status: None   Collection Time: 08/13/17  1:03 AM  Result Value Ref Range   Specimen Description SPUTUM    Special Requests Normal    Sputum evaluation      THIS SPECIMEN IS ACCEPTABLE FOR SPUTUM CULTURE Performed at [facility], [address] [place], [place] [address]    Report Status 08/13/2017 FINAL   Culture, respiratory (NON-Expectorated)     Status: None   Collection Time: 08/13/17  1:03 AM  Result Value Ref Range   Specimen Description      SPUTUM Performed at [facility], [address], [place], [place] [address]    Special Requests      Normal Reflexed from [id number] Performed at [facility] [facility], [place], [place] [address]    Gram Stain      FEW WBC PRESENT,BOTH PMN AND  MONONUCLEAR RARE GRAM POSITIVE COCCI    Culture      Consistent with normal respiratory flora. Performed at [facility], [place] [address], [place],  [address]    Report Status 08/15/2017 FINAL   Glucose, capillary     Status: Abnormal   Collection Time: 08/13/17  7:38 AM  Result Value Ref Range   Glucose-Capillary 191 (H) 65 - 99 mg/dL  Procalcitonin - Baseline     Status: None   Collection Time: 08/13/17  9:00 AM  Result Value Ref Range   Procalcitonin <0.10 ng/mL    Comment:        Interpretation: PCT (Procalcitonin) <= 0.5 ng/mL: Systemic infection (sepsis) is not likely. Local bacterial infection is possible. (NOTE)       Sepsis PCT Algorithm           Lower Respiratory Tract                                      Infection PCT Algorithm    ----------------------------     ----------------------------         PCT < 0.25 ng/mL                PCT < 0.10 ng/mL         Strongly encourage             Strongly  discourage   discontinuation of antibiotics    initiation of antibiotics    ----------------------------     -----------------------------       PCT 0.25 - 0.50 ng/mL            PCT 0.10 - 0.25 ng/mL               OR       >80% decrease in PCT            Discourage initiation of                                            antibiotics      Encourage discontinuation           of antibiotics    ----------------------------     -----------------------------         PCT >= 0.50 ng/mL              PCT 0.26 - 0.50 ng/mL               AND        <80% decrease in PCT             Encourage initiation of                                             antibiotics       Encourage continuation           of antibiotics    ----------------------------     -----------------------------        PCT >= 0.50 ng/mL                  PCT > 0.50 ng/mL  AND         increase in PCT                  Strongly encourage                                      initiation of antibiotics    Strongly encourage escalation           of antibiotics                                     -----------------------------                                           PCT <= 0.25 ng/mL                                                 OR                                        > 80% decrease in PCT                                     Discontinue / Do not initiate                                             antibiotics Performed at [facility], [address] [place], [place] [address]   Glucose, capillary     Status: Abnormal   Collection Time: 08/13/17 11:52 AM  Result Value Ref Range   Glucose-Capillary 311 (H) 65 - 99 mg/dL  Glucose, capillary     Status: Abnormal   Collection Time: 08/13/17  4:19 PM  Result Value Ref Range   Glucose-Capillary 312 (H) 65 - 99 mg/dL  Glucose, capillary     Status: Abnormal   Collection Time: 08/13/17  9:46 PM  Result Value Ref Range   Glucose-Capillary 356 (H) 65 - 99  mg/dL   Comment 1 Notify RN   Glucose, capillary     Status: None   Collection Time: 08/14/17  7:40 AM  Result Value Ref Range   Glucose-Capillary 77 65 - 99 mg/dL  CBC with Differential/Platelet     Status: Abnormal   Collection Time: 09/24/17  2:00 PM  Result Value Ref Range   WBC 11.1 (H) 3.6 - 11.0 K/uL   RBC 4.34 3.80 - 5.20 MIL/uL   Hemoglobin 11.8 (L) 12.0 - 16.0 g/dL   HCT 36.4 35.0 - 47.0 %   MCV 83.8 80.0 - 100.0 fL   MCH 27.1 26.0 - 34.0 pg   MCHC 32.3 32.0 - 36.0 g/dL   RDW 17.8 (H) 11.5 - 14.5 %   Platelets 301 150 - 440 K/uL   Neutrophils Relative % 66 %  Neutro Abs 7.2 (H) 1.4 - 6.5 K/uL   Lymphocytes Relative 12 %   Lymphs Abs 1.4 1.0 - 3.6 K/uL   Monocytes Relative 7 %   Monocytes Absolute 0.8 0.2 - 0.9 K/uL   Eosinophils Relative 14 %   Eosinophils Absolute 1.6 (H) 0 - 0.7 K/uL   Basophils Relative 1 %   Basophils Absolute 0.1 0 - 0.1 K/uL    Comment: Performed at [facility], [address] [place], [place] [address]  Basic metabolic panel     Status: Abnormal   Collection Time: 09/24/17  2:00 PM  Result Value Ref Range   Sodium 139 135 - 145 mmol/L   Potassium 4.3 3.5 - 5.1 mmol/L   Chloride 106 101 - 111 mmol/L   CO2 23 22 - 32 mmol/L   Glucose, Bld 161 (H) 65 - 99 mg/dL   BUN 19 6 - 20 mg/dL   Creatinine, Ser 0.98 0.44 - 1.00 mg/dL   Calcium 8.9 8.9 - 10.3 mg/dL   GFR calc non Af Amer 51 (L) >60 mL/min   GFR calc Af Amer 59 (L) >60 mL/min    Comment: (NOTE) The eGFR has been calculated using the CKD EPI equation. This calculation has not been validated in all clinical situations. eGFR's persistently <60 mL/min signify possible Chronic Kidney Disease.    Anion gap 10 5 - 15    Comment: Performed at [facility], [address] [place], [place] [address]  Procalcitonin - Baseline     Status: None   Collection Time: 09/24/17  2:59 PM  Result Value Ref Range   Procalcitonin <0.10 ng/mL    Comment:         Interpretation: PCT (Procalcitonin) <= 0.5 ng/mL: Systemic infection (sepsis) is not likely. Local bacterial infection is possible. (NOTE)       Sepsis PCT Algorithm           Lower Respiratory Tract                                      Infection PCT Algorithm    ----------------------------     ----------------------------         PCT < 0.25 ng/mL                PCT < 0.10 ng/mL         Strongly encourage             Strongly discourage   discontinuation of antibiotics    initiation of antibiotics    ----------------------------     -----------------------------       PCT 0.25 - 0.50 ng/mL            PCT 0.10 - 0.25 ng/mL               OR       >80% decrease in PCT            Discourage initiation of                                            antibiotics      Encourage discontinuation           of antibiotics    ----------------------------     -----------------------------  PCT >= 0.50 ng/mL              PCT 0.26 - 0.50 ng/mL               AND        <80% decrease in PCT             Encourage initiation of                                             antibiotics       Encourage continuation           of antibiotics    ----------------------------     -----------------------------        PCT >= 0.50 ng/mL                  PCT > 0.50 ng/mL               AND         increase in PCT                  Strongly encourage                                      initiation of antibiotics    Strongly encourage escalation           of antibiotics                                     -----------------------------                                           PCT <= 0.25 ng/mL                                                 OR                                        > 80% decrease in PCT                                     Discontinue / Do not initiate                                             antibiotics Performed at [facility], [address] [place], [place] [address]    Glucose, capillary     Status: Abnormal   Collection Time: 09/24/17  4:25 PM  Result Value Ref Range   Glucose-Capillary 132 (H) 65 - 99 mg/dL  Blood gas, arterial     Status: None   Collection Time: 09/24/17  4:47 PM  Result Value  Ref Range   FIO2 0.28    Delivery systems NASAL CANNULA    pH, Arterial 7.40 7.350 - 7.450   pCO2 arterial 41 32.0 - 48.0 mmHg   pO2, Arterial 99 83.0 - 108.0 mmHg   Bicarbonate 25.4 20.0 - 28.0 mmol/L   Acid-Base Excess 0.5 0.0 - 2.0 mmol/L   O2 Saturation 97.7 %   Patient temperature 37.0    Collection site LEFT RADIAL    Sample type ARTERIAL DRAW    Allens test (pass/fail) PASS PASS    Comment: Performed at [facility], [address] [place], [place] [address]  Glucose, capillary     Status: Abnormal   Collection Time: 09/24/17  8:51 PM  Result Value Ref Range   Glucose-Capillary 152 (H) 65 - 99 mg/dL  CBC     Status: Abnormal   Collection Time: 09/25/17  3:20 AM  Result Value Ref Range   WBC 10.7 3.6 - 11.0 K/uL   RBC 4.11 3.80 - 5.20 MIL/uL   Hemoglobin 11.2 (L) 12.0 - 16.0 g/dL   HCT 34.5 (L) 35.0 - 47.0 %   MCV 84.0 80.0 - 100.0 fL   MCH 27.3 26.0 - 34.0 pg   MCHC 32.5 32.0 - 36.0 g/dL   RDW 17.9 (H) 11.5 - 14.5 %   Platelets 252 150 - 440 K/uL    Comment: Performed at [facility], [address] [place], [place] [address]  Basic metabolic panel     Status: Abnormal   Collection Time: 09/25/17  3:20 AM  Result Value Ref Range   Sodium 137 135 - 145 mmol/L   Potassium 4.3 3.5 - 5.1 mmol/L   Chloride 105 101 - 111 mmol/L   CO2 24 22 - 32 mmol/L   Glucose, Bld 285 (H) 65 - 99 mg/dL   BUN 25 (H) 6 - 20 mg/dL   Creatinine, Ser 1.13 (H) 0.44 - 1.00 mg/dL   Calcium 8.8 (L) 8.9 - 10.3 mg/dL   GFR calc non Af Amer 43 (L) >60 mL/min   GFR calc Af Amer 50 (L) >60 mL/min    Comment: (NOTE) The eGFR has been calculated using the CKD EPI equation. This calculation has not been validated in all clinical  situations. eGFR's persistently <60 mL/min signify possible Chronic Kidney Disease.    Anion gap 8 5 - 15    Comment: Performed at [facility], [address] [place], [place] [address]  Hypersensitivity Pneumonitis     Status: None   Collection Time: 09/25/17  3:20 AM  Result Value Ref Range   A.Fumigatus #1 Abs Negative Negative   Micropolyspora faeni, IgG Negative Negative   Thermoactinomyces vulgaris, IgG Negative Negative   A. Pullulans Abs Negative Negative   Thermoact. Saccharii Negative Negative   [place]on Serum Abs Negative Negative    Comment: (NOTE) Performed At: [facility] [place], [place] [id number] Rush Farmer MD [id number] Performed at [facility], [address] [place], [place] [address]   Glucose, capillary     Status: Abnormal   Collection Time: 09/25/17  7:40 AM  Result Value Ref Range   Glucose-Capillary 279 (H) 65 - 99 mg/dL  Glucose, capillary     Status: Abnormal   Collection Time: 09/25/17 11:24 AM  Result Value Ref Range   Glucose-Capillary 380 (H) 65 - 99 mg/dL  Glucose, capillary     Status: Abnormal   Collection Time: 09/25/17  4:13 PM  Result Value  Ref Range   Glucose-Capillary 264 (H) 65 - 99 mg/dL  Glucose, capillary     Status: Abnormal   Collection Time: 09/25/17  8:31 PM  Result Value Ref Range   Glucose-Capillary 258 (H) 65 - 99 mg/dL   Comment 1 Notify RN    Comment 2 Document in Chart   Glucose, capillary     Status: Abnormal   Collection Time: 09/26/17  7:42 AM  Result Value Ref Range   Glucose-Capillary 182 (H) 65 - 99 mg/dL  Glucose, capillary     Status: Abnormal   Collection Time: 09/26/17 11:52 AM  Result Value Ref Range   Glucose-Capillary 234 (H) 65 - 99 mg/dL    Radiology: Dg Chest 2 View  Result Date: 09/24/2017 CLINICAL DATA:  Shortness of breath. EXAM: CHEST - 2 VIEW COMPARISON:  CT 09/08/2017.  Chest x-ray 08/09/2017. FINDINGS: Mediastinum and hilar structures  normal. Cardiomegaly with normal pulmonary vascularity. No focal infiltrate. Bilateral apical pleural-parenchymal thickening noted consistent with scarring. Previous identified left lower lobe pulmonary nodule best identified by prior CT. IMPRESSION: 1.  Cardiomegaly.  No pulmonary venous congestion. 2. No focal pulmonary infiltrate. Previously noted pulmonary nodule left lower lobe best identified by prior CT Electronically Signed   By: Eads   On: 09/24/2017 14:42    No results found.  Dg Chest 2 View  Result Date: 09/24/2017 CLINICAL DATA:  Shortness of breath. EXAM: CHEST - 2 VIEW COMPARISON:  CT 09/08/2017.  Chest x-ray 08/09/2017. FINDINGS: Mediastinum and hilar structures normal. Cardiomegaly with normal pulmonary vascularity. No focal infiltrate. Bilateral apical pleural-parenchymal thickening noted consistent with scarring. Previous identified left lower lobe pulmonary nodule best identified by prior CT. IMPRESSION: 1.  Cardiomegaly.  No pulmonary venous congestion. 2. No focal pulmonary infiltrate. Previously noted pulmonary nodule left lower lobe best identified by prior CT Electronically Signed   By: Foard   On: 09/24/2017 14:42      Assessment and Plan: Patient Active Problem List   Diagnosis Date Noted  . Chronic respiratory failure with hypoxia ([place]) 10/08/2017  . Simple chronic bronchitis ([place]) 10/08/2017  . SOB (shortness of breath) 09/24/2017  . History of stroke 08/17/2017  . Acute respiratory failure with hypoxia and hypercarbia ([place]) 08/07/2017  . Respiratory failure ([place]) 08/07/2017  . History of GI bleed 07/07/2017  . Atrial fibrillation with RVR ([place]) 06/30/2017  . Bilateral carotid artery disease ([place]) 04/14/2017  . Healthcare maintenance 04/14/2017  . Dizziness 04/07/2017  . 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 TIA (transient ischemic attack) 12/10/2016  . CVA (cerebral vascular accident) ([place]) 12/09/2016  . Bilateral  arm pain 11/27/2016  . Myocardial infarction ([place]) 07/30/2015  . Urinary frequency 02/09/2015  . RUQ abdominal pain 01/26/2015  . Shortness of breath 01/26/2015  . Anxiety 01/26/2015  . Chronic pruritus 12/21/2014  . Cerebral vascular accident ([place]) 11/16/2014  . Chronic low back pain 11/16/2014  . Adult hypothyroidism 11/15/2014  . Allergic rhinitis 11/15/2014  . Body mass index (BMI) of 28.0-28.9 in adult 11/15/2014  . Arthritis 11/15/2014  . Cramps of lower extremity 11/15/2014  . Essential (primary) hypertension 11/15/2014  . Acid reflux 11/15/2014  . Hypercholesteremia 11/15/2014  . Cannot sleep 11/15/2014  . Psoriasis 11/15/2014  . Itch of skin 11/15/2014  . Restless leg 11/15/2014  . Diabetes mellitus, type 2 ([place]) 11/15/2014  . Breath shortness 11/15/2014  . Dermatitis due to unknown cause 04/10/2014  . Arteriosclerosis of coronary artery 02/20/2014  .  AF (paroxysmal atrial fibrillation) ([place]) 02/20/2014  . Temporary cerebral vascular dysfunction 02/20/2014  . DD (diverticular disease) 10/05/2013    1. COPD 2. Cough resolved now since she was on tusionex and also has come off the Amiodarone 3. Abnormal CT of the chest. Insurance refused to let hr do another CT. I am going to wait for 2 months so that we can reorder the scan. Patient is clinically better now since she has come off the amiodarone and has taken steroids 4. Oxygen dependent will continue with current oxygen levels 5. Atrial Fib. Amiodarone on hold due to underlying pulmonary issues for now. Appears to be in NSR will get an ECG now  General Counseling: I have discussed the findings of the evaluation and examination with Zigmund Daniel.  I have also discussed any further diagnostic evaluation thatmay be needed or ordered today. Suri verbalizes understanding of the findings of todays visit. We also reviewed her medications today and discussed drug interactions and side effects including but not limited excessive drowsiness  and altered mental states. We also discussed that there is always a risk not just to her but also people around her. she has been encouraged to call the office with any questions or concerns that should arise related to todays visit.    Time spent: 4mn  I have personally obtained a history, examined the patient, evaluated laboratory and imaging results, formulated the assessment and plan and placed orders.    SAllyne Gee MD [facility]

## 2017-10-15 NOTE — Patient Instructions (Signed)

## 2017-10-20 ENCOUNTER — Ambulatory Visit: Admission: RE | Admit: 2017-10-20 | Payer: Medicare Other | Source: Ambulatory Visit

## 2018-01-11 ENCOUNTER — Ambulatory Visit: Payer: Medicare Other

## 2018-01-28 ENCOUNTER — Ambulatory Visit: Payer: Medicare Other | Admitting: Internal Medicine

## 2018-01-28 ENCOUNTER — Encounter: Payer: Self-pay | Admitting: Internal Medicine

## 2018-01-28 VITALS — BP 118/68 | HR 72 | Resp 16 | Ht 62.0 in | Wt 163.4 lb

## 2018-01-28 DIAGNOSIS — R918 Other nonspecific abnormal finding of lung field: Secondary | ICD-10-CM

## 2018-01-28 DIAGNOSIS — J449 Chronic obstructive pulmonary disease, unspecified: Secondary | ICD-10-CM

## 2018-01-28 DIAGNOSIS — J9611 Chronic respiratory failure with hypoxia: Secondary | ICD-10-CM | POA: Diagnosis not present

## 2018-01-28 DIAGNOSIS — R21 Rash and other nonspecific skin eruption: Secondary | ICD-10-CM

## 2018-01-28 DIAGNOSIS — R0602 Shortness of breath: Secondary | ICD-10-CM

## 2018-01-28 NOTE — Patient Instructions (Signed)
Pulmonary Nodule A pulmonary nodule is a small, round spot in your lung. It is usually found when pictures of your lungs are taken for other reasons. Most pulmonary nodules are not cancerous and do not cause symptoms. Tests will be done to make sure the nodule is not cancerous. Pulmonary nodules that are not cancerous usually do not require treatment. Follow these instructions at home:  Only take medicine as told by your doctor.  Follow up with your doctor as told. Contact a doctor if:  You have trouble breathing when doing activities.  You feel sick or more tired than normal.  You do not feel like eating.  You lose weight without trying to.  You have chills.  You have night sweats. Get help right away if:  You cannot catch your breath.  You start making whistling sounds when breathing (wheezing).  You have a cough that does not go away.  You cough up blood.  You are dizzy or feel like you are going to pass out.  You have sudden chest pain.  You have a fever or lasting symptoms for more than 2-3 days.  You have a fever and your symptoms suddenly get worse. This information is not intended to replace advice given to you by your health care provider. Make sure you discuss any questions you have with your health care provider. Document Released: 06/21/2010 Document Revised: 10/25/2015 Document Reviewed: 11/08/2012 Elsevier Interactive Patient Education  2017 [employer]  
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
## 2018-02-22 ENCOUNTER — Ambulatory Visit: Admit: 2018-02-22 | Discharge: 2018-02-23 | Payer: MEDICARE

## 2018-02-22 DIAGNOSIS — L299 Pruritus, unspecified: Principal | ICD-10-CM

## 2018-02-22 DIAGNOSIS — L309 Dermatitis, unspecified: Secondary | ICD-10-CM

## 2018-02-22 MED ORDER — TRIAMCINOLONE 0.025% IN MODIFIED EUCERIN CREAM
Freq: Two times a day (BID) | TOPICAL | 2 refills | 0 days | Status: CP
Start: 2018-02-22 — End: 2018-08-13

## 2018-02-22 MED ORDER — HYDROXYZINE HCL 25 MG TABLET
ORAL_TABLET | Freq: Four times a day (QID) | ORAL | 3 refills | 0.00000 days | Status: CP | PRN
Start: 2018-02-22 — End: ?

## 2018-02-22 MED ORDER — CLOBETASOL 0.05 % TOPICAL OINTMENT
Freq: Two times a day (BID) | TOPICAL | 1 refills | 0.00000 days | Status: CP
Start: 2018-02-22 — End: 2019-02-22

## 2018-05-12 ENCOUNTER — Encounter: Payer: Self-pay | Admitting: Adult Health

## 2018-05-12 ENCOUNTER — Other Ambulatory Visit: Payer: Self-pay | Admitting: Adult Health

## 2018-05-12 ENCOUNTER — Ambulatory Visit: Payer: Medicare Other | Admitting: Adult Health

## 2018-05-12 VITALS — BP 132/58 | HR 88 | Temp 97.6°F | Resp 16 | Ht 62.0 in | Wt 170.0 lb

## 2018-05-12 DIAGNOSIS — J449 Chronic obstructive pulmonary disease, unspecified: Secondary | ICD-10-CM

## 2018-05-12 DIAGNOSIS — J9611 Chronic respiratory failure with hypoxia: Secondary | ICD-10-CM

## 2018-05-12 DIAGNOSIS — Z9981 Dependence on supplemental oxygen: Secondary | ICD-10-CM

## 2018-05-12 MED ORDER — BUDESONIDE-FORMOTEROL FUMARATE 80-4.5 MCG/ACT IN AERO: 1.0000 | INHALATION_SPRAY | Freq: Two times a day (BID) | RESPIRATORY_TRACT | 3 refills | Status: DC

## 2018-05-12 NOTE — Patient Instructions (Signed)

## 2018-05-12 NOTE — Progress Notes (Signed)
[facility] [address] [place], [place] [address]  Pulmonary Sleep Medicine   Office Visit Note  Patient Name: Peggy [name] DOB: 18-Jul-1928 MRN [id number]  Date of Service: 05/12/2018  Complaints/HPI: Pt is here for follow up on obstructive COPD, chronic respiratory failure with hypoxia and oxygen dependence.  Patient reports overall she is doing well she is here today to ask if it is okay for her to turn up her oxygen at times when she feels like she needs it.  She reports that she has some dyspnea on exertion when she is standing and trying to do laundry or washing dishes etc.  She also notes okay for her to turn her oxygen up from 2-2-1/2 or 3 L.  She does have some sinus drainage going on right now she does not feel like it has progressed into her lungs.  However would like that checked out at today's visit also.  ROS  General: (-) fever, (-) chills, (-) night sweats, (-) weakness Skin: (-) rashes, (-) itching,. Eyes: (-) visual changes, (-) redness, (-) itching. Nose and Sinuses: (-) nasal stuffiness or itchiness, (-) postnasal drip, (-) nosebleeds, (-) sinus trouble. Mouth and Throat: (-) sore throat, (-) hoarseness. Neck: (-) swollen glands, (-) enlarged thyroid, (-) neck pain. Respiratory: + cough, (-) bloody sputum, + shortness of breath, - wheezing. Cardiovascular: - ankle swelling, (-) chest pain. Lymphatic: (-) lymph node enlargement. Neurologic: (-) numbness, (-) tingling. Psychiatric: (-) anxiety, (-) depression   Current Medication: Outpatient Encounter Medications as of 05/12/2018  Medication Sig  . amiodarone (PACERONE) 400 MG tablet Take 0.5 tablets (200 mg total) by mouth daily.  Marland Kitchen apixaban (ELIQUIS) 5 MG TABS tablet Take 1 tablet (5 mg total) by mouth 2 (two) times daily.  Marland Kitchen atorvastatin (LIPITOR) 40 MG tablet Take 1 tablet (40 mg total) by mouth daily at 6 PM.  . chlorpheniramine-HYDROcodone (TUSSIONEX PENNKINETIC ER) 10-8 MG/5ML SUER Take 5 mLs  by mouth every 12 (twelve) hours as needed for cough.  . Cholecalciferol (VITAMIN D3) 5000 units TABS Take 5,000 Units by mouth daily.  Marland Kitchen doxycycline (VIBRAMYCIN) 100 MG capsule Take by mouth.  . fenofibrate (TRICOR) 145 MG tablet Take 1 tablet (145 mg total) by mouth daily.  . fluticasone (FLONASE) 50 MCG/ACT nasal spray USE ONE SPRAY IN EACH NOSTRIL DAILY  . guaiFENesin-codeine 100-10 MG/5ML syrup Take 10 mLs by mouth at bedtime.  Marland Kitchen HUMULIN 70/30 KWIKPEN (70-30) 100 UNIT/ML PEN Inject 20 Units into the skin 2 (two) times daily.  Marland Kitchen HYDROcodone-acetaminophen (NORCO/VICODIN) 5-325 MG tablet Take 1 tablet by mouth 2 (two) times daily. As needed for chronic low back pain  . hydrOXYzine (ATARAX/VISTARIL) 25 MG tablet Take 25 mg by mouth 4 (four) times daily as needed for itching.  Marland Kitchen ipratropium-albuterol (DUONEB) 0.5-2.5 (3) MG/3ML SOLN Take 3 mLs by nebulization every 6 (six) hours as needed.  . lactobacillus acidophilus (BACID) TABS tablet Take 2 tablets by mouth daily.  Marland Kitchen levothyroxine (SYNTHROID, LEVOTHROID) 50 MCG tablet Take 1 tablet (50 mcg total) by mouth daily. PATIENT NEEDS TO SCHEDULE OFFICE VISIT FOR FOLLOW UP  . metoprolol succinate (TOPROL-XL) 50 MG 24 hr tablet Take 50 mg by mouth daily. Take with or immediately following a meal.  . OXYGEN Inhale 2 L into the lungs.  . predniSONE (STERAPRED UNI-PAK 21 TAB) 10 MG (21) TBPK tablet As directed  . VENTOLIN HFA 108 (90 Base) MCG/ACT inhaler Inhale 2 puffs into the lungs every 6 (six) hours as needed.  Marland Kitchen  vitamin B-12 (CYANOCOBALAMIN) 1000 MCG tablet Take 1,000 mcg by mouth daily.  . [DISCONTINUED] budesonide-formoterol (SYMBICORT) 80-4.5 MCG/ACT inhaler Inhale 1 puff into the lungs 2 (two) times daily.   No facility-administered encounter medications on file as of 05/12/2018.     Surgical History: Past Surgical History:  Procedure Laterality Date  . ABDOMINAL HYSTERECTOMY  1970  . BACK SURGERY  2013  . colectomy Right 10/08/2013   Dr.  Egbert Garibaldi  . CORONARY ANGIOPLASTY WITH STENT PLACEMENT      Medical History: Past Medical History:  Diagnosis Date  . A-fib (HCC)   . Allergy   . Arthritis   . Diabetes mellitus without complication (HCC)   . GERD (gastroesophageal reflux disease)   . Hyperlipidemia   . Hypertension   . TIA (transient ischemic attack)     Family History: Family History  Problem Relation Age of Onset  . Breast cancer Mother   . Pancreatitis Father   . Breast cancer Sister   . Lung cancer Brother   . Melanoma Brother   . Throat cancer Brother     Social History: Social History   Socioeconomic History  . Marital status: Widowed    Spouse name: Not on file  . Number of children: 3  . Years of education: H/S  . Highest education level: Not on file  Occupational History  . Occupation: Retired  Engineer, production  . Financial resource strain: Not on file  . Food insecurity:    Worry: Not on file    Inability: Not on file  . Transportation needs:    Medical: Not on file    Non-medical: Not on file  Tobacco Use  . Smoking status: Former Smoker    Packs/day: 1.00    Years: 30.00    Pack years: 30.00    Last attempt to quit: 11/30/1999    Years since quitting: 18.4  . Smokeless tobacco: Never Used  Substance and Sexual Activity  . Alcohol use: No  . Drug use: No  . Sexual activity: Not on file  Lifestyle  . Physical activity:    Days per week: Not on file    Minutes per session: Not on file  . Stress: Not on file  Relationships  . Social connections:    Talks on phone: Not on file    Gets together: Not on file    Attends religious service: Not on file    Active member of club or organization: Not on file    Attends meetings of clubs or organizations: Not on file    Relationship status: Not on file  . Intimate partner violence:    Fear of current or ex partner: Not on file    Emotionally abused: Not on file    Physically abused: Not on file    Forced sexual activity: Not on file   Other Topics Concern  . Not on file  Social History Narrative  . Not on file    Vital Signs: Blood pressure (!) 132/58, pulse 88, temperature 97.6 F (36.4 C), temperature source Oral, resp. rate 16, height 5\' 2"  (1.575 m), weight 170 lb (77.1 kg), SpO2 99 %.  Examination: General Appearance: The patient is well-developed, well-nourished, and in no distress. Skin: [name] inspection of skin unremarkable. Head: normocephalic, no [name] deformities. Eyes: no [name] deformities noted. ENT: ears appear grossly normal no exudates. Neck: Supple. No thyromegaly. No LAD. Respiratory: course breath sounds. Cardiovascular: Normal S1 and S2 without murmur or rub. Extremities: No cyanosis.  pulses are equal. Neurologic: Alert and oriented. No involuntary movements.  LABS: No results found for this or any previous visit (from the past 2160 hour(s)).  Radiology: Dg Chest 2 View  Result Date: 09/24/2017 CLINICAL DATA:  Shortness of breath. EXAM: CHEST - 2 VIEW COMPARISON:  CT 09/08/2017.  Chest x-ray 08/09/2017. FINDINGS: Mediastinum and hilar structures normal. Cardiomegaly with normal pulmonary vascularity. No focal infiltrate. Bilateral apical pleural-parenchymal thickening noted consistent with scarring. Previous identified left lower lobe pulmonary nodule best identified by prior CT. IMPRESSION: 1.  Cardiomegaly.  No pulmonary venous congestion. 2. No focal pulmonary infiltrate. Previously noted pulmonary nodule left lower lobe best identified by prior CT Electronically Signed   By: Maisie Fus  Register   On: 09/24/2017 14:42    No results found.  No results found.    Assessment and Plan: Patient Active Problem List   Diagnosis Date Noted  . Chronic respiratory failure with hypoxia (HCC) 10/08/2017  . Simple chronic bronchitis (HCC) 10/08/2017  . SOB (shortness of breath) 09/24/2017  . History of stroke 08/17/2017  . Acute respiratory failure with hypoxia and hypercarbia (HCC) 08/07/2017   . Respiratory failure (HCC) 08/07/2017  . History of GI bleed 07/07/2017  . Atrial fibrillation with RVR (HCC) 06/30/2017  . Bilateral carotid artery disease (HCC) 04/14/2017  . Healthcare maintenance 04/14/2017  . Dizziness 04/07/2017  . 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 TIA (transient ischemic attack) 12/10/2016  . CVA (cerebral vascular accident) (HCC) 12/09/2016  . Bilateral arm pain 11/27/2016  . Myocardial infarction (HCC) 07/30/2015  . Urinary frequency 02/09/2015  . RUQ abdominal pain 01/26/2015  . Shortness of breath 01/26/2015  . Anxiety 01/26/2015  . Chronic pruritus 12/21/2014  . Cerebral vascular accident (HCC) 11/16/2014  . Chronic low back pain 11/16/2014  . Adult hypothyroidism 11/15/2014  . Allergic rhinitis 11/15/2014  . Body mass index (BMI) of 28.0-28.9 in adult 11/15/2014  . Arthritis 11/15/2014  . Cramps of lower extremity 11/15/2014  . Essential (primary) hypertension 11/15/2014  . Acid reflux 11/15/2014  . Hypercholesteremia 11/15/2014  . Cannot sleep 11/15/2014  . Psoriasis 11/15/2014  . Itch of skin 11/15/2014  . Restless leg 11/15/2014  . Diabetes mellitus, type 2 (HCC) 11/15/2014  . Breath shortness 11/15/2014  . Dermatitis due to unknown cause 04/10/2014  . Arteriosclerosis of coronary artery 02/20/2014  . AF (paroxysmal atrial fibrillation) (HCC) 02/20/2014  . Temporary cerebral vascular dysfunction 02/20/2014  . DD (diverticular disease) 10/05/2013    1. Obstructive chronic bronchitis without exacerbation (HCC) Stable continue current medications and using oxygen as prescribed.  Proceed with symptom management.  2. Chronic respiratory failure with hypoxia [facility]) Patient will continue to need chronic O2 therapy.  We discussed that she may increase her current dose from 2 up to 3 L temporarily for times of exertion.  Or for when she knows she is going to be active and needs a little extra.  She verbalized understanding  of these instructions.  3. Oxygen dependent Patient continues to need oxygen even at rest.  She will continue to use oxygen 2 to 3 L/min via nasal cannula as prescribed.  General Counseling: I have discussed the findings of the evaluation and examination with Peggy [name].  I have also discussed any further diagnostic evaluation thatmay be needed or ordered today. Peggy [name] verbalizes understanding of the findings of todays visit. We also reviewed her medications today and discussed drug interactions and side effects including but not limited excessive drowsiness and altered  mental states. We also discussed that there is always a risk not just to her but also people around her. she has been encouraged to call the office with any questions or concerns that should arise related to todays visit.    Time spent: 20 This patient was seen by Blima Ledger AGNP-C in Collaboration with Dr. Freda Munro as a part of collaborative care agreement.   I have personally obtained a history, examined the patient, evaluated laboratory and imaging results, formulated the assessment and plan and placed orders.    Yevonne Pax, MD [facility] Pulmonary and Critical Care Sleep medicine

## 2018-05-17 ENCOUNTER — Telehealth: Payer: Self-pay

## 2018-05-17 ENCOUNTER — Other Ambulatory Visit: Payer: Self-pay | Admitting: Adult Health

## 2018-05-17 DIAGNOSIS — R059 Cough, unspecified: Secondary | ICD-10-CM

## 2018-05-17 DIAGNOSIS — R05 Cough: Secondary | ICD-10-CM

## 2018-05-17 NOTE — Progress Notes (Signed)
PT continues to have cough, and SOB.  Sent for CXR.

## 2018-05-17 NOTE — Telephone Encounter (Signed)
Pt daughter called that miss Peggy [name] was given antibiotic and prednisone by dr Dareen Piano and she is still not feeling better still coughing as per adam advised pt that she can go for chest xray and take neb med every 4 to 6 hrs as needed  And we can chang antibiotic after we get chest xray result

## 2018-05-25 ENCOUNTER — Ambulatory Visit (INDEPENDENT_AMBULATORY_CARE_PROVIDER_SITE_OTHER): Payer: Medicare Other | Admitting: Adult Health

## 2018-05-25 ENCOUNTER — Encounter: Payer: Self-pay | Admitting: Adult Health

## 2018-05-25 VITALS — BP 157/65 | HR 104 | Temp 96.0°F | Resp 16 | Ht 62.0 in | Wt 171.0 lb

## 2018-05-25 DIAGNOSIS — J189 Pneumonia, unspecified organism: Secondary | ICD-10-CM | POA: Diagnosis not present

## 2018-05-25 DIAGNOSIS — R059 Cough, unspecified: Secondary | ICD-10-CM

## 2018-05-25 DIAGNOSIS — R05 Cough: Secondary | ICD-10-CM | POA: Diagnosis not present

## 2018-05-25 DIAGNOSIS — J9611 Chronic respiratory failure with hypoxia: Secondary | ICD-10-CM

## 2018-05-25 MED ORDER — LEVOFLOXACIN 500 MG PO TABS
500.0000 mg | ORAL_TABLET | Freq: Every day | ORAL | 0 refills | Status: DC
Start: 2018-05-25 — End: 2018-07-21

## 2018-05-25 MED ORDER — HYDROCOD POLST-CPM POLST ER 10-8 MG/5ML PO SUER: 5.0000 mL | Freq: Two times a day (BID) | ORAL | 0 refills | Status: DC | PRN

## 2018-05-25 NOTE — Progress Notes (Signed)
[facility] [address] [place], [place] [address]  Pulmonary Sleep Medicine   Office Visit Note  Patient Name: Peggy [name] DOB: 1929/02/11 MRN [id number]  Date of Service: 05/27/2018  Complaints/HPI: PT is here for sick visit.  Patient reports persistent cough and rattling noise in her chest x2 weeks.  She denies any fever. Patient reports productive cough with yellow sputum, and intermittent SOB especially with exertion.  She reports just not feeling well with fatigue and malaise.  ROS  General: (-) fever, (-) chills, (-) night sweats, (-) weakness Skin: (-) rashes, (-) itching,. Eyes: (-) visual changes, (-) redness, (-) itching. Nose and Sinuses: (-) nasal stuffiness or itchiness, (-) postnasal drip, (-) nosebleeds, (-) sinus trouble. Mouth and Throat: (-) sore throat, (-) hoarseness. Neck: (-) swollen glands, (-) enlarged thyroid, (-) neck pain. Respiratory: + cough, (-) bloody sputum, + shortness of breath, - wheezing. Cardiovascular: - ankle swelling, (-) chest pain. Lymphatic: (-) lymph node enlargement. Neurologic: (-) numbness, (-) tingling. Psychiatric: (-) anxiety, (-) depression   Current Medication: Outpatient Encounter Medications as of 05/25/2018  Medication Sig  . amiodarone (PACERONE) 400 MG tablet Take 0.5 tablets (200 mg total) by mouth daily.  Marland Kitchen apixaban (ELIQUIS) 5 MG TABS tablet Take 1 tablet (5 mg total) by mouth 2 (two) times daily.  Marland Kitchen atorvastatin (LIPITOR) 40 MG tablet Take 1 tablet (40 mg total) by mouth daily at 6 PM.  . budesonide-formoterol (SYMBICORT) 80-4.5 MCG/ACT inhaler Inhale 1 puff into the lungs 2 (two) times daily.  . chlorpheniramine-HYDROcodone (TUSSIONEX PENNKINETIC ER) 10-8 MG/5ML SUER Take 5 mLs by mouth every 12 (twelve) hours as needed for cough.  . Cholecalciferol (VITAMIN D3) 5000 units TABS Take 5,000 Units by mouth daily.  . fenofibrate (TRICOR) 145 MG tablet Take 1 tablet (145 mg total) by mouth daily.  .  fluticasone (FLONASE) 50 MCG/ACT nasal spray USE ONE SPRAY IN EACH NOSTRIL DAILY  . guaiFENesin-codeine 100-10 MG/5ML syrup Take 10 mLs by mouth at bedtime.  Marland Kitchen HUMULIN 70/30 KWIKPEN (70-30) 100 UNIT/ML PEN Inject 20 Units into the skin 2 (two) times daily.  Marland Kitchen HYDROcodone-acetaminophen (NORCO/VICODIN) 5-325 MG tablet Take 1 tablet by mouth 2 (two) times daily. As needed for chronic low back pain  . hydrOXYzine (ATARAX/VISTARIL) 25 MG tablet Take 25 mg by mouth 4 (four) times daily as needed for itching.  Marland Kitchen ipratropium-albuterol (DUONEB) 0.5-2.5 (3) MG/3ML SOLN Take 3 mLs by nebulization every 6 (six) hours as needed.  . lactobacillus acidophilus (BACID) TABS tablet Take 2 tablets by mouth daily.  Marland Kitchen levothyroxine (SYNTHROID, LEVOTHROID) 50 MCG tablet Take 1 tablet (50 mcg total) by mouth daily. PATIENT NEEDS TO SCHEDULE OFFICE VISIT FOR FOLLOW UP  . metoprolol succinate (TOPROL-XL) 50 MG 24 hr tablet Take 50 mg by mouth daily. Take with or immediately following a meal.  . OXYGEN Inhale 2 L into the lungs.  . predniSONE (STERAPRED UNI-PAK 21 TAB) 10 MG (21) TBPK tablet As directed  . VENTOLIN HFA 108 (90 Base) MCG/ACT inhaler Inhale 2 puffs into the lungs every 6 (six) hours as needed.  . vitamin B-12 (CYANOCOBALAMIN) 1000 MCG tablet Take 1,000 mcg by mouth daily.  . [DISCONTINUED] chlorpheniramine-HYDROcodone (TUSSIONEX PENNKINETIC ER) 10-8 MG/5ML SUER Take 5 mLs by mouth every 12 (twelve) hours as needed for cough.  Marland Kitchen levofloxacin (LEVAQUIN) 500 MG tablet Take 1 tablet (500 mg total) by mouth daily.   No facility-administered encounter medications on file as of 05/25/2018.     Surgical  History: Past Surgical History:  Procedure Laterality Date  . ABDOMINAL HYSTERECTOMY  1970  . BACK SURGERY  2013  . colectomy Right 10/08/2013   Dr. Egbert Garibaldi  . CORONARY ANGIOPLASTY WITH STENT PLACEMENT      Medical History: Past Medical History:  Diagnosis Date  . A-fib (HCC)   . Allergy   . Arthritis   .  Diabetes mellitus without complication (HCC)   . GERD (gastroesophageal reflux disease)   . Hyperlipidemia   . Hypertension   . TIA (transient ischemic attack)     Family History: Family History  Problem Relation Age of Onset  . Breast cancer Mother   . Pancreatitis Father   . Breast cancer Sister   . Lung cancer Brother   . Melanoma Brother   . Throat cancer Brother     Social History: Social History   Socioeconomic History  . Marital status: Widowed    Spouse name: Not on file  . Number of children: 3  . Years of education: H/S  . Highest education level: Not on file  Occupational History  . Occupation: Retired  Engineer, production  . Financial resource strain: Not on file  . Food insecurity:    Worry: Not on file    Inability: Not on file  . Transportation needs:    Medical: Not on file    Non-medical: Not on file  Tobacco Use  . Smoking status: Former Smoker    Packs/day: 1.00    Years: 30.00    Pack years: 30.00    Last attempt to quit: 11/30/1999    Years since quitting: 18.5  . Smokeless tobacco: Never Used  Substance and Sexual Activity  . Alcohol use: No  . Drug use: No  . Sexual activity: Not on file  Lifestyle  . Physical activity:    Days per week: Not on file    Minutes per session: Not on file  . Stress: Not on file  Relationships  . Social connections:    Talks on phone: Not on file    Gets together: Not on file    Attends religious service: Not on file    Active member of club or organization: Not on file    Attends meetings of clubs or organizations: Not on file    Relationship status: Not on file  . Intimate partner violence:    Fear of current or ex partner: Not on file    Emotionally abused: Not on file    Physically abused: Not on file    Forced sexual activity: Not on file  Other Topics Concern  . Not on file  Social History Narrative  . Not on file    Vital Signs: Blood pressure (!) 157/65, pulse (!) 104, temperature (!) 96 F  (35.6 C), resp. rate 16, height 5\' 2"  (1.575 m), weight 171 lb (77.6 kg), SpO2 98 %.  Examination: General Appearance: The patient is well-developed, well-nourished, and in no distress. Skin: Gross inspection of skin unremarkable. Head: normocephalic, no gross deformities. Eyes: no gross deformities noted. ENT: ears appear grossly normal no exudates. Neck: Supple. No thyromegaly. No LAD. Respiratory: course breath sounds. Cardiovascular: Normal S1 and S2 without murmur or rub. Extremities: No cyanosis. pulses are equal. Neurologic: Alert and oriented. No involuntary movements.  LABS: No results found for this or any previous visit (from the past 2160 hour(s)).  Radiology: Dg Chest 2 View  Result Date: 09/24/2017 CLINICAL DATA:  Shortness of breath. EXAM: CHEST - 2 VIEW  COMPARISON:  CT 09/08/2017.  Chest x-ray 08/09/2017. FINDINGS: Mediastinum and hilar structures normal. Cardiomegaly with normal pulmonary vascularity. No focal infiltrate. Bilateral apical pleural-parenchymal thickening noted consistent with scarring. Previous identified left lower lobe pulmonary nodule best identified by prior CT. IMPRESSION: 1.  Cardiomegaly.  No pulmonary venous congestion. 2. No focal pulmonary infiltrate. Previously noted pulmonary nodule left lower lobe best identified by prior CT Electronically Signed   By: Maisie Fushomas  Register   On: 09/24/2017 14:42    No results found.  No results found.    Assessment and Plan: Patient Active Problem List   Diagnosis Date Noted  . Chronic respiratory failure with hypoxia (HCC) 10/08/2017  . Simple chronic bronchitis (HCC) 10/08/2017  . SOB (shortness of breath) 09/24/2017  . History of stroke 08/17/2017  . Acute respiratory failure with hypoxia and hypercarbia (HCC) 08/07/2017  . Respiratory failure (HCC) 08/07/2017  . History of GI bleed 07/07/2017  . Atrial fibrillation with RVR (HCC) 06/30/2017  . Bilateral carotid artery disease (HCC) 04/14/2017  .  Healthcare maintenance 04/14/2017  . Dizziness 04/07/2017  . 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 TIA (transient ischemic attack) 12/10/2016  . CVA (cerebral vascular accident) (HCC) 12/09/2016  . Bilateral arm pain 11/27/2016  . Myocardial infarction (HCC) 07/30/2015  . Urinary frequency 02/09/2015  . RUQ abdominal pain 01/26/2015  . Shortness of breath 01/26/2015  . Anxiety 01/26/2015  . Chronic pruritus 12/21/2014  . Cerebral vascular accident (HCC) 11/16/2014  . Chronic low back pain 11/16/2014  . Adult hypothyroidism 11/15/2014  . Allergic rhinitis 11/15/2014  . Body mass index (BMI) of 28.0-28.9 in adult 11/15/2014  . Arthritis 11/15/2014  . Cramps of lower extremity 11/15/2014  . Essential (primary) hypertension 11/15/2014  . Acid reflux 11/15/2014  . Hypercholesteremia 11/15/2014  . Cannot sleep 11/15/2014  . Psoriasis 11/15/2014  . Itch of skin 11/15/2014  . Restless leg 11/15/2014  . Diabetes mellitus, type 2 (HCC) 11/15/2014  . Breath shortness 11/15/2014  . Dermatitis due to unknown cause 04/10/2014  . Arteriosclerosis of coronary artery 02/20/2014  . AF (paroxysmal atrial fibrillation) (HCC) 02/20/2014  . Temporary cerebral vascular dysfunction 02/20/2014  . DD (diverticular disease) 10/05/2013    1. Pneumonia due to infectious organism, unspecified laterality, unspecified part of lung Patient provided course of Levaquin for upper respiratory infection.  Patient should take all medications prescribed and return to clinic if symptoms fail to improve.  Given that the holiday is approaching we advised patient to have a low threshold for going to the emergency department if she feels like she getting worse antibiotics or not helping. - levofloxacin (LEVAQUIN) 500 MG tablet; Take 1 tablet (500 mg total) by mouth daily.  Dispense: 10 tablet; Refill: 0  2. Cough Patient provided prescription for Tussionex to alleviate her cough at night while she  sleeps. - chlorpheniramine-HYDROcodone (TUSSIONEX PENNKINETIC ER) 10-8 MG/5ML SUER; Take 5 mLs by mouth every 12 (twelve) hours as needed for cough.  Dispense: 115 mL; Refill: 0 Reviewed risks and possible side effects associated with taking opiates, benzodiazepines and other CNS depressants. Combination of these could cause dizziness and drowsiness. Advised patient not to drive or operate machinery when taking these medications, as patient's and other's life can be at risk and will have consequences. Patient verbalized understanding in this matter. Dependence and abuse for these drugs will be monitored closely. A Controlled substance policy and procedure is on file which allows [facility] to order a urine drug  screen test at any visit. Patient understands and agrees with the plan  3. Chronic respiratory failure with hypoxia [facility]) Patient will continue to use oxygen continuously as prescribed.  General Counseling: I have discussed the findings of the evaluation and examination with Joyce Gross.  I have also discussed any further diagnostic evaluation thatmay be needed or ordered today. Ausha verbalizes understanding of the findings of todays visit. We also reviewed her medications today and discussed drug interactions and side effects including but not limited excessive drowsiness and altered mental states. We also discussed that there is always a risk not just to her but also people around her. she has been encouraged to call the office with any questions or concerns that should arise related to todays visit.    Time spent: 25 This patient was seen by Blima Ledger AGNP-C in Collaboration with Dr. Freda Munro as a part of collaborative care agreement.   I have personally obtained a history, examined the patient, evaluated laboratory and imaging results, formulated the assessment and plan and placed orders.    Yevonne Pax, MD [facility] Pulmonary and Critical Care Sleep medicine

## 2018-07-13 ENCOUNTER — Ambulatory Visit: Payer: Self-pay | Admitting: Adult Health

## 2018-07-14 ENCOUNTER — Encounter: Payer: Self-pay | Admitting: Adult Health

## 2018-07-14 ENCOUNTER — Ambulatory Visit
Admission: RE | Admit: 2018-07-14 | Discharge: 2018-07-14 | Disposition: A | Discharge status: Home / Self Care | Payer: Medicare Other | Source: Ambulatory Visit | Attending: Adult Health | Admitting: Adult Health

## 2018-07-14 ENCOUNTER — Ambulatory Visit
Admission: RE | Admit: 2018-07-14 | Discharge: 2018-07-14 | Disposition: A | Discharge status: Home / Self Care | Payer: Medicare Other | Attending: Adult Health | Admitting: Adult Health

## 2018-07-14 ENCOUNTER — Ambulatory Visit (INDEPENDENT_AMBULATORY_CARE_PROVIDER_SITE_OTHER): Payer: Medicare Other | Admitting: Adult Health

## 2018-07-14 VITALS — BP 156/68 | HR 73 | Temp 98.6°F | Resp 16 | Ht 62.0 in | Wt 170.0 lb

## 2018-07-14 DIAGNOSIS — K219 Gastro-esophageal reflux disease without esophagitis: Secondary | ICD-10-CM

## 2018-07-14 DIAGNOSIS — J988 Other specified respiratory disorders: Secondary | ICD-10-CM | POA: Diagnosis not present

## 2018-07-14 DIAGNOSIS — Z9981 Dependence on supplemental oxygen: Secondary | ICD-10-CM

## 2018-07-14 DIAGNOSIS — R05 Cough: Secondary | ICD-10-CM

## 2018-07-14 DIAGNOSIS — J9611 Chronic respiratory failure with hypoxia: Secondary | ICD-10-CM

## 2018-07-14 DIAGNOSIS — R059 Cough, unspecified: Secondary | ICD-10-CM

## 2018-07-14 DIAGNOSIS — J679 Hypersensitivity pneumonitis due to unspecified organic dust: Secondary | ICD-10-CM

## 2018-07-14 MED ORDER — AZITHROMYCIN 250 MG PO TABS: ORAL_TABLET | ORAL | 0 refills | Status: DC

## 2018-07-14 NOTE — Progress Notes (Signed)
[facility] [address] [place], [place] [address]  Pulmonary Sleep Medicine   Office Visit Note  Patient Name: Peggy [name] DOB: [birth year] MRN [id number]  Date of Service: 07/21/2018  Complaints/HPI: PT here for sick visit. Pt reports she has been severely coughing and sob.  She has been using her nebulizer and inhaler. She has gotten gotten minimal relief.  She reports it is worse as night. She has had to increase her oxygen up to 2.5liter at night. Her PCP Dr. Dareen Piano, did a chest x -ray last week that showed no  PNA or mass (per patient, these results are not available to me at this visit) He placed her on Prednisone which she has almost completed, and she has been using RX cough syrup. She feels like she is getting worse not better.     ROS  General: (-) fever, (-) chills, (-) night sweats, (-) weakness Skin: (-) rashes, (-) itching,. Eyes: (-) visual changes, (-) redness, (-) itching. Nose and Sinuses: (-) nasal stuffiness or itchiness, (-) postnasal drip, (-) nosebleeds, (-) sinus trouble. Mouth and Throat: (-) sore throat, (-) hoarseness. Neck: (-) swollen glands, (-) enlarged thyroid, (-) neck pain. Respiratory: + cough, (-) bloody sputum, + shortness of breath, - wheezing. Cardiovascular: - ankle swelling, (-) chest pain. Lymphatic: (-) lymph node enlargement. Neurologic: (-) numbness, (-) tingling. Psychiatric: (-) anxiety, (-) depression   Current Medication: Outpatient Encounter Medications as of 07/14/2018  Medication Sig  . amiodarone (PACERONE) 400 MG tablet Take 0.5 tablets (200 mg total) by mouth daily.  Marland Kitchen apixaban (ELIQUIS) 5 MG TABS tablet Take 1 tablet (5 mg total) by mouth 2 (two) times daily.  Marland Kitchen atorvastatin (LIPITOR) 40 MG tablet Take 1 tablet (40 mg total) by mouth daily at 6 PM.  . budesonide-formoterol (SYMBICORT) 80-4.5 MCG/ACT inhaler Inhale 1 puff into the lungs 2 (two) times daily.  . chlorpheniramine-HYDROcodone (TUSSIONEX  PENNKINETIC ER) 10-8 MG/5ML SUER Take 5 mLs by mouth every 12 (twelve) hours as needed for cough.  . Cholecalciferol (VITAMIN D3) 5000 units TABS Take 5,000 Units by mouth daily.  . fenofibrate (TRICOR) 145 MG tablet Take 1 tablet (145 mg total) by mouth daily.  . fluticasone (FLONASE) 50 MCG/ACT nasal spray USE ONE SPRAY IN EACH NOSTRIL DAILY  . guaiFENesin (MUCINEX) 600 MG 12 hr tablet Take by mouth 2 (two) times daily.  Marland Kitchen guaiFENesin-codeine 100-10 MG/5ML syrup Take 10 mLs by mouth at bedtime.  Marland Kitchen HUMULIN 70/30 KWIKPEN (70-30) 100 UNIT/ML PEN Inject 20 Units into the skin 2 (two) times daily.  Marland Kitchen HYDROcodone-acetaminophen (NORCO/VICODIN) 5-325 MG tablet Take 1 tablet by mouth 2 (two) times daily. As needed for chronic low back pain  . hydrOXYzine (ATARAX/VISTARIL) 25 MG tablet Take 25 mg by mouth 4 (four) times daily as needed for itching.  Marland Kitchen ipratropium-albuterol (DUONEB) 0.5-2.5 (3) MG/3ML SOLN Take 3 mLs by nebulization every 6 (six) hours as needed.  . lactobacillus acidophilus (BACID) TABS tablet Take 2 tablets by mouth daily.  Marland Kitchen levothyroxine (SYNTHROID, LEVOTHROID) 50 MCG tablet Take 1 tablet (50 mcg total) by mouth daily. PATIENT NEEDS TO SCHEDULE OFFICE VISIT FOR FOLLOW UP  . metoprolol succinate (TOPROL-XL) 50 MG 24 hr tablet Take 50 mg by mouth daily. Take with or immediately following a meal.  . OXYGEN Inhale 2 L into the lungs.  . predniSONE (DELTASONE) 20 MG tablet   . predniSONE (STERAPRED UNI-PAK 21 TAB) 10 MG (21) TBPK tablet As directed  . VENTOLIN HFA 108 (  90 Base) MCG/ACT inhaler Inhale 2 puffs into the lungs every 6 (six) hours as needed.  . vitamin B-12 (CYANOCOBALAMIN) 1000 MCG tablet Take 1,000 mcg by mouth daily.  . [DISCONTINUED] levofloxacin (LEVAQUIN) 500 MG tablet Take 1 tablet (500 mg total) by mouth daily. (Patient not taking: Reported on 07/21/2018)  . [DISCONTINUED] azithromycin (ZITHROMAX) 250 MG tablet Take as directed. (Patient not taking: Reported on 07/21/2018)   . [DISCONTINUED] pantoprazole (PROTONIX) 40 MG tablet Take 1 tablet (40 mg total) by mouth daily.   No facility-administered encounter medications on file as of 07/14/2018.     Surgical History: Past Surgical History:  Procedure Laterality Date  . ABDOMINAL HYSTERECTOMY  1970  . BACK SURGERY  2013  . colectomy Right 10/08/2013   Dr. Egbert Garibaldi  . CORONARY ANGIOPLASTY WITH STENT PLACEMENT      Medical History: Past Medical History:  Diagnosis Date  . A-fib (HCC)   . Allergy   . Arthritis   . Diabetes mellitus without complication (HCC)   . GERD (gastroesophageal reflux disease)   . Hyperlipidemia   . Hypertension   . TIA (transient ischemic attack)     Family History: Family History  Problem Relation Age of Onset  . Breast cancer Mother   . Pancreatitis Father   . Breast cancer Sister   . Lung cancer Brother   . Melanoma Brother   . Throat cancer Brother     Social History: Social History   Socioeconomic History  . Marital status: Widowed    Spouse name: Not on file  . Number of children: 3  . Years of education: H/S  . Highest education level: Not on file  Occupational History  . Occupation: Retired  Engineer, production  . Financial resource strain: Not on file  . Food insecurity:    Worry: Not on file    Inability: Not on file  . Transportation needs:    Medical: Not on file    Non-medical: Not on file  Tobacco Use  . Smoking status: Former Smoker    Packs/day: 1.00    Years: 30.00    Pack years: 30.00    Last attempt to quit: 11/30/1999    Years since quitting: 18.6  . Smokeless tobacco: Never Used  Substance and Sexual Activity  . Alcohol use: No  . Drug use: No  . Sexual activity: Not on file  Lifestyle  . Physical activity:    Days per week: Not on file    Minutes per session: Not on file  . Stress: Not on file  Relationships  . Social connections:    Talks on phone: Not on file    Gets together: Not on file    Attends religious service: Not on  file    Active member of club or organization: Not on file    Attends meetings of clubs or organizations: Not on file    Relationship status: Not on file  . Intimate partner violence:    Fear of current or ex partner: Not on file    Emotionally abused: Not on file    Physically abused: Not on file    Forced sexual activity: Not on file  Other Topics Concern  . Not on file  Social History Narrative  . Not on file    Vital Signs: Blood pressure (!) 156/68, pulse 73, temperature 98.6 F (37 C), temperature source Oral, resp. rate 16, height 5\' 2"  (1.575 m), weight 170 lb (77.1 kg), SpO2 94 %.  Examination: General Appearance: The patient is well-developed, well-nourished, and in no distress. Skin: [name] inspection of skin unremarkable. Head: normocephalic, no [name] deformities. Eyes: no [name] deformities noted. ENT: ears appear grossly normal no exudates. Neck: Supple. No thyromegaly. No LAD. Respiratory: course breath sounds. Cardiovascular: Normal S1 and S2 without murmur or rub. Extremities: No cyanosis. pulses are equal. Neurologic: Alert and oriented. No involuntary movements.  LABS: No results found for this or any previous visit (from the past 2160 hour(s)).  Radiology: Dg Chest 2 View  Result Date: 09/24/2017 CLINICAL DATA:  Shortness of breath. EXAM: CHEST - 2 VIEW COMPARISON:  CT 09/08/2017.  Chest x-ray 08/09/2017. FINDINGS: Mediastinum and hilar structures normal. Cardiomegaly with normal pulmonary vascularity. No focal infiltrate. Bilateral apical pleural-parenchymal thickening noted consistent with scarring. Previous identified left lower lobe pulmonary nodule best identified by prior CT. IMPRESSION: 1.  Cardiomegaly.  No pulmonary venous congestion. 2. No focal pulmonary infiltrate. Previously noted pulmonary nodule left lower lobe best identified by prior CT Electronically Signed   By: Maisie Fushomas  Register   On: 09/24/2017 14:42    No results found.  Dg Chest 2  View  Result Date: 07/14/2018 CLINICAL DATA:  Cough with congestion and shortness of breath for 2-3 weeks. History of atrial fibrillation, COPD, hypertension and diabetes. EXAM: CHEST - 2 VIEW COMPARISON:  Radiographs 09/24/2017.  CT 09/08/2017. FINDINGS: The heart size and mediastinal contours are stable. There is aortic atherosclerosis. There is stable biapical scarring and a stable small nodule in the left perihilar region, located in the lower lobe on CT and stable from 2015. No edema, confluent airspace opacity or pleural effusion. Stable mild deformity of the L1 vertebral body and upper lumbar spine degenerative changes. No acute osseous findings. IMPRESSION: Stable chest without evidence of acute process. Electronically Signed   By: Carey BullocksWilliam  Veazey M.D.   On: 07/14/2018 15:39      Assessment and Plan: Patient Active Problem List   Diagnosis Date Noted  . Chronic respiratory failure with hypoxia (HCC) 10/08/2017  . Simple chronic bronchitis (HCC) 10/08/2017  . SOB (shortness of breath) 09/24/2017  . History of stroke 08/17/2017  . Acute respiratory failure with hypoxia and hypercarbia (HCC) 08/07/2017  . Respiratory failure (HCC) 08/07/2017  . History of GI bleed 07/07/2017  . Atrial fibrillation with RVR (HCC) 06/30/2017  . Bilateral carotid artery disease (HCC) 04/14/2017  . Healthcare maintenance 04/14/2017  . Dizziness 04/07/2017  . 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 TIA (transient ischemic attack) 12/10/2016  . CVA (cerebral vascular accident) (HCC) 12/09/2016  . Bilateral arm pain 11/27/2016  . Myocardial infarction (HCC) 07/30/2015  . Urinary frequency 02/09/2015  . RUQ abdominal pain 01/26/2015  . Shortness of breath 01/26/2015  . Anxiety 01/26/2015  . Chronic pruritus 12/21/2014  . Cerebral vascular accident (HCC) 11/16/2014  . Chronic low back pain 11/16/2014  . Adult hypothyroidism 11/15/2014  . Allergic rhinitis 11/15/2014  . Body mass  index (BMI) of 28.0-28.9 in adult 11/15/2014  . Arthritis 11/15/2014  . Cramps of lower extremity 11/15/2014  . Essential (primary) hypertension 11/15/2014  . Acid reflux 11/15/2014  . Hypercholesteremia 11/15/2014  . Cannot sleep 11/15/2014  . Psoriasis 11/15/2014  . Itch of skin 11/15/2014  . Restless leg 11/15/2014  . Diabetes mellitus, type 2 (HCC) 11/15/2014  . Breath shortness 11/15/2014  . Dermatitis due to unknown cause 04/10/2014  . Arteriosclerosis of coronary artery 02/20/2014  . AF (paroxysmal atrial fibrillation) (HCC) 02/20/2014  .  Temporary cerebral vascular dysfunction 02/20/2014  . DD (diverticular disease) 10/05/2013   1. Respiratory infection Provided with Z-Pak and prednisone taper at this time.  Return to clinic in 7 to 10 days if symptoms fail to improve.  2. Cough Chest x-ray ordered due to patient's continued cough will review when results are available. - DG Chest 2 View; Future   3. Chronic respiratory failure with hypoxia (HCC) Patient continues to use oxygen 24 hours a day.  She should continue using her nebulizer and inhalers as prescribed.  4. Oxygen dependent Continue using oxygen 2-1/2 L via nasal cannula continuously.  5. Gastroesophageal reflux disease without esophagitis Stable currently not taking any medications.  6. Pneumonitis, hypersensitivity (HCC) Patient is a history of hypersensitivity pneumonitis.  We will continue to follow.   General Counseling: I have discussed the findings of the evaluation and examination with Peggy [name].  I have also discussed any further diagnostic evaluation thatmay be needed or ordered today. Peggy [name] verbalizes understanding of the findings of todays visit. We also reviewed her medications today and discussed drug interactions and side effects including but not limited excessive drowsiness and altered mental states. We also discussed that there is always a risk not just to her but also people around her. she has been  encouraged to call the office with any questions or concerns that should arise related to todays visit.    Time spent: 25 This patient was seen by Blima Ledger AGNP-C in Collaboration with Dr. Freda Munro as a part of collaborative care agreement. Peggy [name] [phone]  I have personally obtained a history, examined the patient, evaluated laboratory and imaging results, formulated the assessment and plan and placed orders.    Yevonne Pax, MD [facility] Pulmonary and Critical Care Sleep medicine

## 2018-07-21 ENCOUNTER — Ambulatory Visit: Payer: Medicare Other | Admitting: Adult Health

## 2018-07-21 ENCOUNTER — Other Ambulatory Visit: Payer: Self-pay | Admitting: Adult Health

## 2018-07-21 ENCOUNTER — Encounter: Payer: Self-pay | Admitting: Adult Health

## 2018-07-21 VITALS — BP 156/72 | HR 94 | Temp 97.9°F | Resp 18 | Ht 62.0 in | Wt 167.0 lb

## 2018-07-21 DIAGNOSIS — J9611 Chronic respiratory failure with hypoxia: Secondary | ICD-10-CM

## 2018-07-21 DIAGNOSIS — Z9981 Dependence on supplemental oxygen: Secondary | ICD-10-CM | POA: Diagnosis not present

## 2018-07-21 DIAGNOSIS — K219 Gastro-esophageal reflux disease without esophagitis: Secondary | ICD-10-CM

## 2018-07-21 DIAGNOSIS — J679 Hypersensitivity pneumonitis due to unspecified organic dust: Secondary | ICD-10-CM

## 2018-07-21 MED ORDER — PANTOPRAZOLE SODIUM 40 MG PO TBEC: 40.0000 mg | DELAYED_RELEASE_TABLET | Freq: Every day | ORAL | 0 refills | Status: DC

## 2018-07-21 NOTE — Progress Notes (Signed)
[facility] [address] [place], [place] [address]  Internal MEDICINE  Office Visit Note  Patient Name: Peggy [name]  [id number]  [id number]  Date of Service: 07/21/2018   Complaints/HPI Pt is here for establishment of PCP. Chief Complaint  Patient presents with  . Cough    completed z pak and not any better    HPI  Patient here for acute pulmonary visit.  She reports continued cough that has been going on for over 3 months.  She has been on multiple antibiotics as well as steroids without relief.  She has a history of pneumonitis hypersensitivity.  Patient also reports some chest discomfort with this coughing.  She most recently finished a course of azithromycin and has had no relief.  Patient reports the cough is usually worse at night when she lays down.  Patient is not currently take any medications for GERD.  She remains oxygen dependent at this time and is on oxygen via nasal cannula 24 hours a day.  Current Medication: Outpatient Encounter Medications as of 07/21/2018  Medication Sig  . amiodarone (PACERONE) 400 MG tablet Take 0.5 tablets (200 mg total) by mouth daily.  Marland Kitchen apixaban (ELIQUIS) 5 MG TABS tablet Take 1 tablet (5 mg total) by mouth 2 (two) times daily.  Marland Kitchen atorvastatin (LIPITOR) 40 MG tablet Take 1 tablet (40 mg total) by mouth daily at 6 PM.  . budesonide-formoterol (SYMBICORT) 80-4.5 MCG/ACT inhaler Inhale 1 puff into the lungs 2 (two) times daily.  . chlorpheniramine-HYDROcodone (TUSSIONEX PENNKINETIC ER) 10-8 MG/5ML SUER Take 5 mLs by mouth every 12 (twelve) hours as needed for cough.  . Cholecalciferol (VITAMIN D3) 5000 units TABS Take 5,000 Units by mouth daily.  . fenofibrate (TRICOR) 145 MG tablet Take 1 tablet (145 mg total) by mouth daily.  . fluticasone (FLONASE) 50 MCG/ACT nasal spray USE ONE SPRAY IN EACH NOSTRIL DAILY  . guaiFENesin (MUCINEX) 600 MG 12 hr tablet Take by mouth 2 (two) times daily.  Marland Kitchen guaiFENesin-codeine 100-10 MG/5ML syrup  Take 10 mLs by mouth at bedtime.  Marland Kitchen HUMULIN 70/30 KWIKPEN (70-30) 100 UNIT/ML PEN Inject 20 Units into the skin 2 (two) times daily.  Marland Kitchen HYDROcodone-acetaminophen (NORCO/VICODIN) 5-325 MG tablet Take 1 tablet by mouth 2 (two) times daily. As needed for chronic low back pain  . hydrOXYzine (ATARAX/VISTARIL) 25 MG tablet Take 25 mg by mouth 4 (four) times daily as needed for itching.  Marland Kitchen ipratropium-albuterol (DUONEB) 0.5-2.5 (3) MG/3ML SOLN Take 3 mLs by nebulization every 6 (six) hours as needed.  . lactobacillus acidophilus (BACID) TABS tablet Take 2 tablets by mouth daily.  Marland Kitchen levothyroxine (SYNTHROID, LEVOTHROID) 50 MCG tablet Take 1 tablet (50 mcg total) by mouth daily. PATIENT NEEDS TO SCHEDULE OFFICE VISIT FOR FOLLOW UP  . metoprolol succinate (TOPROL-XL) 50 MG 24 hr tablet Take 50 mg by mouth daily. Take with or immediately following a meal.  . OXYGEN Inhale 2 L into the lungs.  . predniSONE (DELTASONE) 20 MG tablet   . predniSONE (STERAPRED UNI-PAK 21 TAB) 10 MG (21) TBPK tablet As directed  . VENTOLIN HFA 108 (90 Base) MCG/ACT inhaler Inhale 2 puffs into the lungs every 6 (six) hours as needed.  . vitamin B-12 (CYANOCOBALAMIN) 1000 MCG tablet Take 1,000 mcg by mouth daily.  . [DISCONTINUED] azithromycin (ZITHROMAX) 250 MG tablet Take as directed. (Patient not taking: Reported on 07/21/2018)  . [DISCONTINUED] levofloxacin (LEVAQUIN) 500 MG tablet Take 1 tablet (500 mg total) by mouth daily. (Patient not  taking: Reported on 07/21/2018)   No facility-administered encounter medications on file as of 07/21/2018.     Surgical History: Past Surgical History:  Procedure Laterality Date  . ABDOMINAL HYSTERECTOMY  1970  . BACK SURGERY  2013  . colectomy Right 10/08/2013   Dr. Egbert Garibaldi  . CORONARY ANGIOPLASTY WITH STENT PLACEMENT      Medical History: Past Medical History:  Diagnosis Date  . A-fib (HCC)   . Allergy   . Arthritis   . Diabetes mellitus without complication (HCC)   . GERD  (gastroesophageal reflux disease)   . Hyperlipidemia   . Hypertension   . TIA (transient ischemic attack)     Family History: Family History  Problem Relation Age of Onset  . Breast cancer Mother   . Pancreatitis Father   . Breast cancer Sister   . Lung cancer Brother   . Melanoma Brother   . Throat cancer Brother     Social History   Socioeconomic History  . Marital status: Widowed    Spouse name: Not on file  . Number of children: 3  . Years of education: H/S  . Highest education level: Not on file  Occupational History  . Occupation: Retired  Engineer, production  . Financial resource strain: Not on file  . Food insecurity:    Worry: Not on file    Inability: Not on file  . Transportation needs:    Medical: Not on file    Non-medical: Not on file  Tobacco Use  . Smoking status: Former Smoker    Packs/day: 1.00    Years: 30.00    Pack years: 30.00    Last attempt to quit: 11/30/1999    Years since quitting: 18.6  . Smokeless tobacco: Never Used  Substance and Sexual Activity  . Alcohol use: No  . Drug use: No  . Sexual activity: Not on file  Lifestyle  . Physical activity:    Days per week: Not on file    Minutes per session: Not on file  . Stress: Not on file  Relationships  . Social connections:    Talks on phone: Not on file    Gets together: Not on file    Attends religious service: Not on file    Active member of club or organization: Not on file    Attends meetings of clubs or organizations: Not on file    Relationship status: Not on file  . Intimate partner violence:    Fear of current or ex partner: Not on file    Emotionally abused: Not on file    Physically abused: Not on file    Forced sexual activity: Not on file  Other Topics Concern  . Not on file  Social History Narrative  . Not on file     Review of Systems  Constitutional: Negative for chills, fatigue and unexpected weight change.  HENT: Negative for congestion, rhinorrhea, sneezing  and sore throat.   Eyes: Negative for photophobia, pain and redness.  Respiratory: Positive for cough. Negative for chest tightness and shortness of breath.   Cardiovascular: Negative for chest pain and palpitations.  Gastrointestinal: Negative for abdominal pain, constipation, diarrhea, nausea and vomiting.  Endocrine: Negative.   Genitourinary: Negative for dysuria and frequency.  Musculoskeletal: Negative for arthralgias, back pain, joint swelling and neck pain.  Skin: Negative for rash.  Allergic/Immunologic: Negative.   Neurological: Negative for tremors and numbness.  Hematological: Negative for adenopathy. Does not bruise/bleed easily.  Psychiatric/Behavioral: Negative for  behavioral problems and sleep disturbance. The patient is not nervous/anxious.     Vital Signs: BP (!) 156/72   Pulse 94   Temp 97.9 F (36.6 C) (Oral)   Resp 18   Ht 5\' 2"  (1.575 m)   Wt 167 lb (75.8 kg)   SpO2 94%   BMI 30.54 kg/m    Physical Exam Vitals signs and nursing note reviewed.  Constitutional:      General: She is not in acute distress.    Appearance: She is well-developed. She is not diaphoretic.  HENT:     Head: Normocephalic and atraumatic.     Mouth/Throat:     Pharynx: No oropharyngeal exudate.  Eyes:     Pupils: Pupils are equal, round, and reactive to light.  Neck:     Musculoskeletal: Normal range of motion and neck supple.     Thyroid: No thyromegaly.     Vascular: No JVD.     Trachea: No tracheal deviation.  Cardiovascular:     Rate and Rhythm: Normal rate and regular rhythm.     Heart sounds: Normal heart sounds. No murmur. No friction rub. No gallop.   Pulmonary:     Effort: Pulmonary effort is normal. No respiratory distress.     Breath sounds: Normal breath sounds. No wheezing or rales.  Chest:     Chest wall: No tenderness.  Abdominal:     Palpations: Abdomen is soft.     Tenderness: There is no abdominal tenderness. There is no guarding.  Musculoskeletal:  Normal range of motion.  Lymphadenopathy:     Cervical: No cervical adenopathy.  Skin:    General: Skin is warm and dry.  Neurological:     Mental Status: She is alert and oriented to person, place, and time.     Cranial Nerves: No cranial nerve deficit.  Psychiatric:        Behavior: Behavior normal.        Thought Content: Thought content normal.        Judgment: Judgment normal.    Assessment/Plan: 1. Pneumonitis, hypersensitivity (HCC) With patient's history of pneumonitis will rule out GERD and then discuss chest CT for patient.  2. Gastroesophageal reflux disease without esophagitis Upper GI is ordered patient should have tomorrow to evaluate for GERD.  3. Chronic respiratory failure with hypoxia (HCC) Patient continues to require oxygen 24 hours a day.  She should continue using her inhalers and nebulizer as prescribed.  4. Oxygen dependent Patient continue with 2 and half liters per minute via nasal cannula continuously.  General Counseling: abygale shofner understanding of the findings of todays visit and agrees with plan of treatment. I have discussed any further diagnostic evaluation that may be needed or ordered today. We also reviewed her medications today. she has been encouraged to call the office with any questions or concerns that should arise related to todays visit.  No orders of the defined types were placed in this encounter.   No orders of the defined types were placed in this encounter.   Time spent:30 Minutes   This patient was seen by Blima Ledger AGNP-C in Collaboration with Dr Lyndon Code as a part of collaborative care agreement  [name] AGNP-C Internal Medicine

## 2018-07-22 ENCOUNTER — Other Ambulatory Visit: Payer: Self-pay | Admitting: Adult Health

## 2018-07-22 DIAGNOSIS — R059 Cough, unspecified: Secondary | ICD-10-CM

## 2018-07-22 DIAGNOSIS — R05 Cough: Secondary | ICD-10-CM

## 2018-07-22 MED ORDER — HYDROCOD POLST-CPM POLST ER 10-8 MG/5ML PO SUER: 5.0000 mL | Freq: Two times a day (BID) | ORAL | 0 refills | Status: DC | PRN

## 2018-07-26 ENCOUNTER — Other Ambulatory Visit: Payer: Self-pay | Admitting: Adult Health

## 2018-07-26 ENCOUNTER — Ambulatory Visit
Admission: RE | Admit: 2018-07-26 | Discharge: 2018-07-26 | Disposition: A | Discharge status: Home / Self Care | Payer: Medicare Other | Source: Ambulatory Visit | Attending: Adult Health | Admitting: Adult Health

## 2018-07-26 DIAGNOSIS — R059 Cough, unspecified: Secondary | ICD-10-CM

## 2018-07-26 DIAGNOSIS — R05 Cough: Secondary | ICD-10-CM | POA: Diagnosis not present

## 2018-07-27 ENCOUNTER — Telehealth: Payer: Self-pay | Admitting: Adult Health

## 2018-07-27 NOTE — Telephone Encounter (Signed)
Left a message on  [facility] voicemail regarding upper gi, and to make sure that  Ms Worman is taken her acid reflux medication.

## 2018-07-27 NOTE — Telephone Encounter (Signed)
-----   Message from [name], NP sent at 07/26/2018  4:35 PM EST ----- Her Upper GI shows severe acid reflux and she should take the medicine for acid reflux if she hasn't already started it. Let me know if it doesn't get better in a few weeks, and I can refer her to GI.

## 2018-08-05 ENCOUNTER — Ambulatory Visit: Payer: Medicare Other | Admitting: Internal Medicine

## 2018-08-05 ENCOUNTER — Encounter: Payer: Self-pay | Admitting: Internal Medicine

## 2018-08-05 VITALS — BP 138/68 | HR 78 | Resp 16 | Ht 62.0 in | Wt 167.0 lb

## 2018-08-05 DIAGNOSIS — J9611 Chronic respiratory failure with hypoxia: Secondary | ICD-10-CM | POA: Diagnosis not present

## 2018-08-05 DIAGNOSIS — R059 Cough, unspecified: Secondary | ICD-10-CM

## 2018-08-05 DIAGNOSIS — J449 Chronic obstructive pulmonary disease, unspecified: Secondary | ICD-10-CM

## 2018-08-05 DIAGNOSIS — Z9981 Dependence on supplemental oxygen: Secondary | ICD-10-CM

## 2018-08-05 DIAGNOSIS — R0602 Shortness of breath: Secondary | ICD-10-CM | POA: Diagnosis not present

## 2018-08-05 DIAGNOSIS — K219 Gastro-esophageal reflux disease without esophagitis: Secondary | ICD-10-CM

## 2018-08-05 DIAGNOSIS — R05 Cough: Secondary | ICD-10-CM

## 2018-08-05 NOTE — Progress Notes (Signed)
[facility] [address] [place], [place] [address]  Pulmonary Sleep Medicine   Office Visit Note  Patient Name: Peggy [name] DOB: April 08, 1929 MRN [id number]  Date of Service: 08/05/2018  Complaints/HPI: PT here for follow up. She was placed on oxygen after having a copd exacerbation.  She would like to stop wearing her oxygen.  Her 6 min walk is performed today and her oxygen did not fall below 93% after 9 laps.  Pt would like to stop wearing her oxygen.   ROS  General: (-) fever, (-) chills, (-) night sweats, (-) weakness Skin: (-) rashes, (-) itching,. Eyes: (-) visual changes, (-) redness, (-) itching. Nose and Sinuses: (-) nasal stuffiness or itchiness, (-) postnasal drip, (-) nosebleeds, (-) sinus trouble. Mouth and Throat: (-) sore throat, (-) hoarseness. Neck: (-) swollen glands, (-) enlarged thyroid, (-) neck pain. Respiratory: - cough, (-) bloody sputum, + shortness of breath, - wheezing. Cardiovascular: - ankle swelling, (-) chest pain. Lymphatic: (-) lymph node enlargement. Neurologic: (-) numbness, (-) tingling. Psychiatric: (-) anxiety, (-) depression   Current Medication: Outpatient Encounter Medications as of 08/05/2018  Medication Sig  . amiodarone (PACERONE) 400 MG tablet Take 0.5 tablets (200 mg total) by mouth daily.  Marland Kitchen apixaban (ELIQUIS) 5 MG TABS tablet Take 1 tablet (5 mg total) by mouth 2 (two) times daily.  Marland Kitchen atorvastatin (LIPITOR) 40 MG tablet Take 1 tablet (40 mg total) by mouth daily at 6 PM.  . budesonide-formoterol (SYMBICORT) 80-4.5 MCG/ACT inhaler Inhale 1 puff into the lungs 2 (two) times daily.  . chlorpheniramine-HYDROcodone (TUSSIONEX PENNKINETIC ER) 10-8 MG/5ML SUER Take 5 mLs by mouth every 12 (twelve) hours as needed for cough.  . Cholecalciferol (VITAMIN D3) 5000 units TABS Take 5,000 Units by mouth daily.  . fenofibrate (TRICOR) 145 MG tablet Take 1 tablet (145 mg total) by mouth daily.  . fluticasone (FLONASE) 50 MCG/ACT nasal  spray USE ONE SPRAY IN EACH NOSTRIL DAILY  . guaiFENesin (MUCINEX) 600 MG 12 hr tablet Take by mouth 2 (two) times daily.  Marland Kitchen guaiFENesin-codeine 100-10 MG/5ML syrup Take 10 mLs by mouth at bedtime.  Marland Kitchen HUMULIN 70/30 KWIKPEN (70-30) 100 UNIT/ML PEN Inject 20 Units into the skin 2 (two) times daily.  Marland Kitchen HYDROcodone-acetaminophen (NORCO/VICODIN) 5-325 MG tablet Take 1 tablet by mouth 2 (two) times daily. As needed for chronic low back pain  . hydrOXYzine (ATARAX/VISTARIL) 25 MG tablet Take 25 mg by mouth 4 (four) times daily as needed for itching.  Marland Kitchen ipratropium-albuterol (DUONEB) 0.5-2.5 (3) MG/3ML SOLN Take 3 mLs by nebulization every 6 (six) hours as needed.  . lactobacillus acidophilus (BACID) TABS tablet Take 2 tablets by mouth daily.  Marland Kitchen levothyroxine (SYNTHROID, LEVOTHROID) 50 MCG tablet Take 1 tablet (50 mcg total) by mouth daily. PATIENT NEEDS TO SCHEDULE OFFICE VISIT FOR FOLLOW UP  . metoprolol succinate (TOPROL-XL) 50 MG 24 hr tablet Take 50 mg by mouth daily. Take with or immediately following a meal.  . OXYGEN Inhale 2 L into the lungs.  . pantoprazole (PROTONIX) 40 MG tablet TAKE 1 TABLET(40 MG) BY MOUTH DAILY  . predniSONE (DELTASONE) 20 MG tablet   . VENTOLIN HFA 108 (90 Base) MCG/ACT inhaler Inhale 2 puffs into the lungs every 6 (six) hours as needed.  . vitamin B-12 (CYANOCOBALAMIN) 1000 MCG tablet Take 1,000 mcg by mouth daily.  . [DISCONTINUED] predniSONE (STERAPRED UNI-PAK 21 TAB) 10 MG (21) TBPK tablet As directed (Patient not taking: Reported on 08/05/2018)   No facility-administered encounter  medications on file as of 08/05/2018.     Surgical History: Past Surgical History:  Procedure Laterality Date  . ABDOMINAL HYSTERECTOMY  1970  . BACK SURGERY  2013  . colectomy Right 10/08/2013   Dr. Egbert Garibaldi  . CORONARY ANGIOPLASTY WITH STENT PLACEMENT      Medical History: Past Medical History:  Diagnosis Date  . A-fib (HCC)   . Allergy   . Arthritis   . Diabetes mellitus without  complication (HCC)   . GERD (gastroesophageal reflux disease)   . Hyperlipidemia   . Hypertension   . TIA (transient ischemic attack)     Family History: Family History  Problem Relation Age of Onset  . Breast cancer Mother   . Pancreatitis Father   . Breast cancer Sister   . Lung cancer Brother   . Melanoma Brother   . Throat cancer Brother     Social History: Social History   Socioeconomic History  . Marital status: Widowed    Spouse name: Not on file  . Number of children: 3  . Years of education: H/S  . Highest education level: Not on file  Occupational History  . Occupation: Retired  Engineer, production  . Financial resource strain: Not on file  . Food insecurity:    Worry: Not on file    Inability: Not on file  . Transportation needs:    Medical: Not on file    Non-medical: Not on file  Tobacco Use  . Smoking status: Former Smoker    Packs/day: 1.00    Years: 30.00    Pack years: 30.00    Last attempt to quit: 11/30/1999    Years since quitting: 18.6  . Smokeless tobacco: Never Used  Substance and Sexual Activity  . Alcohol use: No  . Drug use: No  . Sexual activity: Not on file  Lifestyle  . Physical activity:    Days per week: Not on file    Minutes per session: Not on file  . Stress: Not on file  Relationships  . Social connections:    Talks on phone: Not on file    Gets together: Not on file    Attends religious service: Not on file    Active member of club or organization: Not on file    Attends meetings of clubs or organizations: Not on file    Relationship status: Not on file  . Intimate partner violence:    Fear of current or ex partner: Not on file    Emotionally abused: Not on file    Physically abused: Not on file    Forced sexual activity: Not on file  Other Topics Concern  . Not on file  Social History Narrative  . Not on file    Vital Signs: Height 5\' 2"  (1.575 m), weight 167 lb (75.8 kg).  Examination: General Appearance: The  patient is well-developed, well-nourished, and in no distress. Skin: [name] inspection of skin unremarkable. Head: normocephalic, no [name] deformities. Eyes: no [name] deformities noted. ENT: ears appear grossly normal no exudates. Neck: Supple. No thyromegaly. No LAD. Respiratory: clear bilateraly. Cardiovascular: Normal S1 and S2 without murmur or rub. Extremities: No cyanosis. pulses are equal. Neurologic: Alert and oriented. No involuntary movements.  LABS: No results found for this or any previous visit (from the past 2160 hour(s)).  Radiology: Dg Roselle Locus Double Cm (hd Ba)  Result Date: 07/26/2018 CLINICAL DATA:  Cough for 3 weeks. Chest pain for 4 nights. Indigestion. EXAM: UPPER GI SERIES  WITHOUT KUB TECHNIQUE: Routine upper GI series was performed with thin/high density/water soluble barium. FLUOROSCOPY TIME:  Fluoroscopy Time:  1 minutes 6 seconds Radiation Exposure Index (if provided by the fluoroscopic device): 8.7 mGy Number of Acquired Spot Images: 0 COMPARISON:  None. FINDINGS: Examination of the esophagus demonstrated normal esophageal motility. Normal esophageal morphology without evidence of esophagitis or ulceration. No esophageal stricture, diverticula, or mass lesion. No evidence of hiatal hernia. Severe gastroesophageal reflux extending to the level of the thoracic inlet. Examination of the stomach demonstrated normal rugal folds and areae gastricae. The gastric mucosa appeared unremarkable without evidence of ulceration, scarring, or mass lesion. Gastric motility and emptying was normal. Fluoroscopic examination of the duodenum demonstrates normal motility and morphology without evidence of ulceration or mass lesion. At the end of the examination a 13 mm barium tablet was administered which transited through the esophagus and esophagogastric junction without delay. IMPRESSION: 1. Severe gastroesophageal reflux extending to the level of the thoracic inlet. 2. Otherwise, unremarkable  upper GI. Electronically Signed   By: Elige Ko   On: 07/26/2018 08:30    No results found.  Dg Chest 2 View  Result Date: 07/14/2018 CLINICAL DATA:  Cough with congestion and shortness of breath for 2-3 weeks. History of atrial fibrillation, COPD, hypertension and diabetes. EXAM: CHEST - 2 VIEW COMPARISON:  Radiographs 09/24/2017.  CT 09/08/2017. FINDINGS: The heart size and mediastinal contours are stable. There is aortic atherosclerosis. There is stable biapical scarring and a stable small nodule in the left perihilar region, located in the lower lobe on CT and stable from 2015. No edema, confluent airspace opacity or pleural effusion. Stable mild deformity of the L1 vertebral body and upper lumbar spine degenerative changes. No acute osseous findings. IMPRESSION: Stable chest without evidence of acute process. Electronically Signed   By: Carey Bullocks M.D.   On: 07/14/2018 15:39   Dg Kayleen Memos W Double Cm (hd Ba)  Result Date: 07/26/2018 CLINICAL DATA:  Cough for 3 weeks. Chest pain for 4 nights. Indigestion. EXAM: UPPER GI SERIES WITHOUT KUB TECHNIQUE: Routine upper GI series was performed with thin/high density/water soluble barium. FLUOROSCOPY TIME:  Fluoroscopy Time:  1 minutes 6 seconds Radiation Exposure Index (if provided by the fluoroscopic device): 8.7 mGy Number of Acquired Spot Images: 0 COMPARISON:  None. FINDINGS: Examination of the esophagus demonstrated normal esophageal motility. Normal esophageal morphology without evidence of esophagitis or ulceration. No esophageal stricture, diverticula, or mass lesion. No evidence of hiatal hernia. Severe gastroesophageal reflux extending to the level of the thoracic inlet. Examination of the stomach demonstrated normal rugal folds and areae gastricae. The gastric mucosa appeared unremarkable without evidence of ulceration, scarring, or mass lesion. Gastric motility and emptying was normal. Fluoroscopic examination of the duodenum demonstrates  normal motility and morphology without evidence of ulceration or mass lesion. At the end of the examination a 13 mm barium tablet was administered which transited through the esophagus and esophagogastric junction without delay. IMPRESSION: 1. Severe gastroesophageal reflux extending to the level of the thoracic inlet. 2. Otherwise, unremarkable upper GI. Electronically Signed   By: Elige Ko   On: 07/26/2018 08:30      Assessment and Plan: Patient Active Problem List   Diagnosis Date Noted  . Chronic respiratory failure with hypoxia (HCC) 10/08/2017  . Simple chronic bronchitis (HCC) 10/08/2017  . SOB (shortness of breath) 09/24/2017  . History of stroke 08/17/2017  . Acute respiratory failure with hypoxia and hypercarbia (HCC) 08/07/2017  . Respiratory failure (  HCC) 08/07/2017  . History of GI bleed 07/07/2017  . Atrial fibrillation with RVR (HCC) 06/30/2017  . Bilateral carotid artery disease (HCC) 04/14/2017  . Healthcare maintenance 04/14/2017  . Dizziness 04/07/2017  . 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 TIA (transient ischemic attack) 12/10/2016  . CVA (cerebral vascular accident) (HCC) 12/09/2016  . Bilateral arm pain 11/27/2016  . Myocardial infarction (HCC) 07/30/2015  . Urinary frequency 02/09/2015  . RUQ abdominal pain 01/26/2015  . Shortness of breath 01/26/2015  . Anxiety 01/26/2015  . Chronic pruritus 12/21/2014  . Cerebral vascular accident (HCC) 11/16/2014  . Chronic low back pain 11/16/2014  . Adult hypothyroidism 11/15/2014  . Allergic rhinitis 11/15/2014  . Body mass index (BMI) of 28.0-28.9 in adult 11/15/2014  . Arthritis 11/15/2014  . Cramps of lower extremity 11/15/2014  . Essential (primary) hypertension 11/15/2014  . Acid reflux 11/15/2014  . Hypercholesteremia 11/15/2014  . Cannot sleep 11/15/2014  . Psoriasis 11/15/2014  . Itch of skin 11/15/2014  . Restless leg 11/15/2014  . Diabetes mellitus, type 2 (HCC) 11/15/2014   . Breath shortness 11/15/2014  . Dermatitis due to unknown cause 04/10/2014  . Arteriosclerosis of coronary artery 02/20/2014  . AF (paroxysmal atrial fibrillation) (HCC) 02/20/2014  . Temporary cerebral vascular dysfunction 02/20/2014  . DD (diverticular disease) 10/05/2013   1. Chronic obstructive pulmonary disease, unspecified COPD type (HCC) Stable, continue current therapy as prescribed.  2. Gastroesophageal reflux disease without esophagitis Patient upper GI that shows severe gastric reflux.  Started on her medication and is doing well at this time.  Continues to have some residual cough however much more tolerable than before.  3. Chronic respiratory failure with hypoxia (HCC) Patient 6-minute walk reveals that she does well without oxygen now.  Instructed patient that she like to go without oxygen some to see how she is going to do with exertion and movement that she certainly has not right.  4. Cough Much improved since starting GERD medication.   5. Oxygen dependent We will get an overnight oximetry make sure patient does not have nocturnal hypoxia otherwise she can wean off the oxygen during the day.  6. SOB (shortness of breath) FVC is 1.8 which is 87% of pre-predicted value FEV1 is 1.4 which is 94% predicted value FEV1/FVC is 79% which 110% of the pre-predicted value on today spirometry. - Spirometry with Graph - 6 minute walk - Pulse oximetry, overnight; Future   General Counseling: I have discussed the findings of the evaluation and examination with Peggy [name].  I have also discussed any further diagnostic evaluation thatmay be needed or ordered today. Peggy [name] verbalizes understanding of the findings of todays visit. We also reviewed her medications today and discussed drug interactions and side effects including but not limited excessive drowsiness and altered mental states. We also discussed that there is always a risk not just to her but also people around her. she has been  encouraged to call the office with any questions or concerns that should arise related to todays visit.    Time spent: 25 This patient was seen by Blima Ledger AGNP-C in Collaboration with Dr. Freda Munro as a part of collaborative care agreement.   I have personally obtained a history, examined the patient, evaluated laboratory and imaging results, formulated the assessment and plan and placed orders.    Yevonne Pax, MD [facility] Pulmonary and Critical Care Sleep medicine

## 2018-08-05 NOTE — Patient Instructions (Signed)
Chronic Obstructive Pulmonary Disease Chronic obstructive pulmonary disease (COPD) is a long-term (chronic) lung problem. When you have COPD, it is hard for air to get in and out of your lungs. Usually the condition gets worse over time, and your lungs will never return to normal. There are things you can do to keep yourself as healthy as possible.  Your doctor may treat your condition with: ? Medicines. ? Oxygen. ? Lung surgery.  Your doctor may also recommend: ? Rehabilitation. This includes steps to make your body work better. It may involve a team of specialists. ? Quitting smoking, if you smoke. ? Exercise and changes to your diet. ? Comfort measures (palliative care). Follow these instructions at home: Medicines  Take over-the-counter and prescription medicines only as told by your doctor.  Talk to your doctor before taking any cough or allergy medicines. You may need to avoid medicines that cause your lungs to be dry. Lifestyle  If you smoke, stop. Smoking makes the problem worse. If you need help quitting, ask your doctor.  Avoid being around things that make your breathing worse. This may include smoke, chemicals, and fumes.  Stay active, but remember to rest as well.  Learn and use tips on how to relax.  Make sure you get enough sleep. Most adults need at least 7 hours of sleep every night.  Eat healthy foods. Eat smaller meals more often. Rest before meals. Controlled breathing Learn and use tips on how to control your breathing as told by your doctor. Try:  Breathing in (inhaling) through your nose for 1 second. Then, pucker your lips and breath out (exhale) through your lips for 2 seconds.  Putting one hand on your belly (abdomen). Breathe in slowly through your nose for 1 second. Your hand on your belly should move out. Pucker your lips and breathe out slowly through your lips. Your hand on your belly should move in as you breathe out.  Controlled coughing Learn  and use controlled coughing to clear mucus from your lungs. Follow these steps: 1. Lean your head a little forward. 2. Breathe in deeply. 3. Try to hold your breath for 3 seconds. 4. Keep your mouth slightly open while coughing 2 times. 5. Spit any mucus out into a tissue. 6. Rest and do the steps again 1 or 2 times as needed. General instructions  Make sure you get all the shots (vaccines) that your doctor recommends. Ask your doctor about a flu shot and a pneumonia shot.  Use oxygen therapy and pulmonary rehabilitation if told by your doctor. If you need home oxygen therapy, ask your doctor if you should buy a tool to measure your oxygen level (oximeter).  Make a COPD action plan with your doctor. This helps you to know what to do if you feel worse than usual.  Manage any other conditions you have as told by your doctor.  Avoid going outside when it is very hot, cold, or humid.  Avoid people who have a sickness you can catch (contagious).  Keep all follow-up visits as told by your doctor. This is important. Contact a doctor if:  You cough up more mucus than usual.  There is a change in the color or thickness of the mucus.  It is harder to breathe than usual.  Your breathing is faster than usual.  You have trouble sleeping.  You need to use your medicines more often than usual.  You have trouble doing your normal activities such as getting dressed   or walking around the house. Get help right away if:  You have shortness of breath while resting.  You have shortness of breath that stops you from: ? Being able to talk. ? Doing normal activities.  Your chest hurts for longer than 5 minutes.  Your skin color is more blue than usual.  Your pulse oximeter shows that you have low oxygen for longer than 5 minutes.  You have a fever.  You feel too tired to breathe normally. Summary  Chronic obstructive pulmonary disease (COPD) is a long-term lung problem.  The way your  lungs work will never return to normal. Usually the condition gets worse over time. There are things you can do to keep yourself as healthy as possible.  Take over-the-counter and prescription medicines only as told by your doctor.  If you smoke, stop. Smoking makes the problem worse. This information is not intended to replace advice given to you by your health care provider. Make sure you discuss any questions you have with your health care provider. Document Released: 11/05/2007 Document Revised: 06/23/2016 Document Reviewed: 06/23/2016 Elsevier Interactive Patient Education  2019 [employer]  

## 2018-08-09 ENCOUNTER — Ambulatory Visit
Admit: 2018-08-09 | Discharge: 2018-08-10 | Payer: MEDICARE | Attending: MOHS-Micrographic Surgery | Primary: MOHS-Micrographic Surgery

## 2018-08-09 DIAGNOSIS — C4491 Basal cell carcinoma of skin, unspecified: Principal | ICD-10-CM

## 2018-08-09 DIAGNOSIS — L2084 Intrinsic (allergic) eczema: Principal | ICD-10-CM

## 2018-08-09 DIAGNOSIS — J449 Chronic obstructive pulmonary disease, unspecified: Principal | ICD-10-CM

## 2018-08-09 MED ORDER — DUPILUMAB 300 MG/2 ML SUBCUTANEOUS SYRINGE
0 refills | 0 days | Status: CP
Start: 2018-08-09 — End: ?

## 2018-08-09 NOTE — Unmapped (Signed)
Patient Education        dupilumab  Pronunciation:  doo PIL [place] mab  Brand:  Dupixent  What is the most important information I should know about dupilumab?  Follow all directions on your medicine label and package. Tell each of your healthcare providers about all your medical conditions, allergies, and all medicines you use.  What is dupilumab?  Dupilumab is used to treat moderate-to-severe eczema (atopic dermatitis) that cannot be controlled with topical medicines (for use on the skin).  Dupilumab is also used together with other medications to treat moderate-to-severe asthma that is not controlled with other asthma medicines.  Dupilumab is used for asthma or eczema in adults and children at least 37 years old.  Dupilumab is used only in adults to treat a condition called chronic rhinosinusitis (long-term sinus inflammation) that is associated with nasal polyps.  Dupilumab may also be used for purposes not listed in this medication guide.  What should I discuss with my healthcare provider before using dupilumab?  You should not use dupilumab if you are allergic to it.  Dupilumab should not be given to a child younger than 57 years old.  Tell your doctor if you have ever had:  ?? eye problems;  ?? a parasite infection (such as roundworms or tapeworms); or  ?? if you are scheduled to receive any vaccine.  Tell your doctor if you are pregnant or breastfeeding.  How should I use dupilumab?  Follow all directions on your prescription label and read all medication guides or instruction sheets. Use the medicine exactly as directed.  Dupilumab is injected under the skin, usually once every other week. Your first dose may be given in 2 injections.  A healthcare provider may teach you how to properly use the medication by yourself. Read and carefully follow any Instructions for Use provided with your medicine. Ask your doctor or pharmacist if you don't understand all instructions.  Do not shake the prefilled syringe. Prepare your injection only when you are ready to give it. Do not use if the medicine looks cloudy, has changed colors, or has particles in it. Call your pharmacist for new medicine.  Store prefilled syringes in their original carton in the refrigerator. Protect from light and do not freeze.  Take a syringe out of the refrigerator and let it reach room temperature for 45 minutes before injecting your dose. Leave the needle cap on the syringe until you are ready to inject your dose.  You may store a prefilled syringe at cool room temperature for up to 14 days. Throw the medicine away if not used within 14 days. Do not put it back into the refrigerator.  Each prefilled syringe is for one use only. Throw it away after one use, even if there is still medicine left inside.  Use a needle and syringe only once and then place them in a puncture-proof sharps container. Follow state or local laws about how to dispose of this container. Keep it out of the reach of children and pets.  Dupilumab is not a rescue medicine for asthma attacks. Use only fast-acting inhalation medicine for an attack. Seek medical attention if your breathing problems get worse quickly, or if you think your asthma medications are not working as well.  If you also use a steroid medication, your dose needs may change while using dupilumab. Do not change your steroid dose or dosing schedule without your doctor's advice.  What happens if I miss a dose?  Use the  medicine as soon as you can, but skip the missed dose if you are more than 7 days late for the injection. Do not use two doses at one time.  What happens if I overdose?  Seek emergency medical attention or call the Poison Help line at [phone].  What should I avoid while using dupilumab?  Do not receive a live vaccine while using dupilumab. The vaccine may not work as well during this time, and may not fully protect you from disease. Live vaccines include measles, mumps, rubella (MMR), polio, rotavirus, typhoid, yellow fever, varicella (chickenpox), zoster (shingles), and nasal flu (influenza) vaccine.  What are the possible side effects of dupilumab?  Get emergency medical help if you have signs of an allergic reaction: hives, rash, itching; fever, swollen glands, joint pain; feeling light-headed, difficult breathing; swelling of your face, lips, tongue, or throat.  Call your doctor at once if you have:  ?? new or worsening eye pain or discomfort;  ?? vision changes;  ?? watery eyes (your eyes may be more sensitive to light);  ?? feeling like something is in your eye; or  ?? blood vessel inflammation --fever, chest pain, trouble breathing, skin rash, numbness or prickly feeling in your arms or legs.  Common side effects may include:  ?? pain, swelling, burning, or irritation where an injection was given;  ?? eye redness or itching, puffy eyelids;  ?? stomach pain, nausea, vomiting;  ?? tooth pain;  ?? sleep problems (insomnia);  ?? joint pain; or  ?? cold sores or fever blisters on your lips or in your mouth.  This is not a complete list of side effects and others may occur. Call your doctor for medical advice about side effects. You may report side effects to FDA at 1-800-FDA-1088.  What other drugs will affect dupilumab?  Other drugs may affect dupilumab, including prescription and over-the-counter medicines, vitamins, and herbal products. Tell your doctor about all your current medicines and any medicine you start or stop using.  Where can I get more information?  Your pharmacist can provide more information about dupilumab.  Remember, keep this and all other medicines out of the reach of children, never share your medicines with others, and use this medication only for the indication prescribed.   Every effort has been made to ensure that the information provided by [employer] ('[employer]') is accurate, up-to-date, and complete, but no guarantee is made to that effect. Drug information contained herein may be time sensitive. [employer] information has been compiled for use by healthcare practitioners and consumers in the [place] and therefore [employer] does not warrant that uses outside of the [place] are appropriate, unless specifically indicated otherwise. [employer]'s drug information does not endorse drugs, diagnose patients or recommend therapy. [employer]'s drug information is an [employer], corporate to assist licensed healthcare practitioners in caring for their patients and/or to serve consumers viewing this service as a supplement to, and not a substitute for, the expertise, skill, knowledge and judgment of healthcare practitioners. The absence of a warning for a given drug or drug combination in no way should be construed to indicate that the drug or drug combination is safe, effective or appropriate for any given patient. Mult[employer] does not assume any responsibility for any aspect of healthcare administered with the aid of information [employer] provides. The information contained herein is not intended to cover all possible uses, directions, precautions, warnings, drug interactions, allergic reactions, or adverse effects. If you have questions about the drugs  you are taking, check with your doctor, nurse or pharmacist.  Copyright [phone] [employer] Version: 3.01. Revision date: 12/04/2017.  Care instructions adapted under license by [facility]. If you have questions about a medical condition or this instruction, always ask your healthcare professional. [employer] disclaims any warranty or liability for your use of this information.

## 2018-08-09 NOTE — Unmapped (Signed)
Dermatology Follow-up Note    A/P:    Eczematous dermatitis, flaring:    Assessment:  Favor atopic eruption of the elderly. Biopsy in 2015 was supportive. Allergic contact dermatitis seems less likely due to patchy distribution over large body surface areas. Atorvastatin may be contributing to eczematous eruption. Will need to be mindful of treatment given history of partially-treated latent tuberculosis. Nb-UVb unfortunately was cost-prohibitive due to expensive co-pays.    Plan:  ?? Continue triamcinolone 0.025% cream in modified Eucerin twice daily  ?? Start dupilumab per below  ?? Day 0: inject dupilumab subcutaneously 600 mg on day 0. Day 15: inject dupilumab 300 mg every 14 days.  ?? If dupilumab is not affordable or ineffective, consider stopping atorvastatin for 2 months and start triamcinolone 0.1% ointment    Return in about 7 weeks (around 09/27/2018) for Re-check.      CC:  Itchy rash    HPI:  Jessica Moses is a friendly 85 y.o. female last seen by Dr. Fayrene Fearing in 6 months ago for eczema here for follow-up. At the last visit, she was hoping to start nbUVb, but the co-pays were costly. Since then, she continues to use triamcinolone 0.025% cream in eucerin twice daily but this is not helping her rash. She continues to itchy severely on her back, legs, and arms, especially at night. She has been taking atorvastatin for at least 10 years.    Pertinent PMH:  No history of skin cancer  Self-reported history of latent tuberculosis, partially treated, as Infectious Disease physician reportedly told her to stop treatment    ROS:   Negative for fevers, chills, recent illnesses, nausea    PE:  General: Well-developed, well-nourished female, in no acute distress.   Neuro: Alert and oriented, and answers questions appropriately.  Skin: Per patient request, focused exam with inspection and palpation of the face, neck, upper chest, arms, back, lower legs was performed today. Findings were normal with exception of the following: - Eczematous plaques on the lower legs, arms, and flanks  - All other areas examined were normal or had no significant findings

## 2018-08-09 NOTE — Unmapped (Signed)
Per test claim for Dupixent at the [facility], patient needs Medication Assistance Program for Prior Authorization.

## 2018-08-11 DIAGNOSIS — J449 Chronic obstructive pulmonary disease, unspecified: Principal | ICD-10-CM

## 2018-08-11 DIAGNOSIS — C4491 Basal cell carcinoma of skin, unspecified: Principal | ICD-10-CM

## 2018-08-11 NOTE — Unmapped (Signed)
Patient called me to inform us that her insurance company has called her to inform her they denied the Dupixent 300 mg. Patient wants to know what the next steps are. Please advise.

## 2018-08-12 NOTE — Unmapped (Signed)
Addended by: Inis Sizer A on: 08/11/2018 10:15 PM     Modules accepted: Level of Service

## 2018-08-13 DIAGNOSIS — L2084 Intrinsic (allergic) eczema: Principal | ICD-10-CM

## 2018-08-13 MED ORDER — TRIAMCINOLONE ACETONIDE 0.1 % TOPICAL OINTMENT
INTRAMUSCULAR | 3 refills | 0.00000 days | Status: CP
Start: 2018-08-13 — End: 2018-08-16

## 2018-08-13 MED ORDER — PIMECROLIMUS 1 % TOPICAL CREAM
Freq: Two times a day (BID) | TOPICAL | 1 refills | 0.00000 days | Status: CP
Start: 2018-08-13 — End: 2018-08-16

## 2018-08-13 NOTE — Unmapped (Signed)
Returned patient call, no answer so left voicemail. Dupilumab denied as she has not tried topical calcineurin inhibitors. Stop triamcinolone 0.025% in modified Eucerin cream due to lack of efficacy. Sent in triamcinolone 0.1% ointment twice daily and pimecrolimus cream twice daily. Will call to see if she has notable improvement in 2 weeks. If there is no improvement, will re-send dupilumab prescription.

## 2018-08-16 DIAGNOSIS — L2084 Intrinsic (allergic) eczema: Principal | ICD-10-CM

## 2018-08-16 MED ORDER — TRIAMCINOLONE ACETONIDE 0.1 % TOPICAL OINTMENT
INTRAMUSCULAR | 3 refills | 0.00000 days | Status: CP
Start: 2018-08-16 — End: ?

## 2018-08-16 MED ORDER — PIMECROLIMUS 1 % TOPICAL CREAM
Freq: Two times a day (BID) | TOPICAL | 1 refills | 0.00000 days | Status: CP
Start: 2018-08-16 — End: 2019-08-16

## 2018-08-16 NOTE — Unmapped (Signed)
Spoke to Jessica Moses. Explained insurance denied dupilumab as she had not failed topical calcineurin inhibitor. Sent in Elidel cream and triamcinolone 0.1% ointment. Will call back in 2-3 weeks to assess response. If not improving, will re-send dupilumab prescription.

## 2018-08-23 NOTE — Unmapped (Signed)
Email  received requesting PA for Pimecrolimus 0.1%, PA was initiated via [facility] Reniyah Hutt Key: [id number]

## 2018-08-23 NOTE — Unmapped (Signed)
Fax received stating PA APPROVAL for pimecrolimus , Effective until 08/23/2019. Pt notified.

## 2018-08-23 NOTE — Unmapped (Signed)
PA for Pimecrolimus sent today through cover my meds.  Key Doyce Loose

## 2018-08-23 NOTE — Unmapped (Signed)
Synetta Fail from [facility] called to say the Pimecrolimus 1% was approved. She stated Jestine had also been notified.

## 2018-08-24 IMAGING — CT CT CHEST W/ CM
2 of 3 series · 15 of 36 positions shown, 18 images · IV contrast (iopamidol)
Comparison: 08/12/2017, 08/07/2017 and 10/07/2013 chest CT.

CLINICAL DATA: 88-year-old female with difficulty breathing worse
when lying down. Subsequent encounter.

EXAM:
CT CHEST WITH CONTRAST
TECHNIQUE: Multidetector CT imaging of the chest was performed during
intravenous contrast administration.
CONTRAST:  75mL [id number] IOPAMIDOL ([id number]) INJECTION 61%

[Series 2: axial st · axial · 0.62mm/px · z∈[+174,+406]mm · 12 of 138 slices shown, 15 images]
[im 11/138  mediastinal]
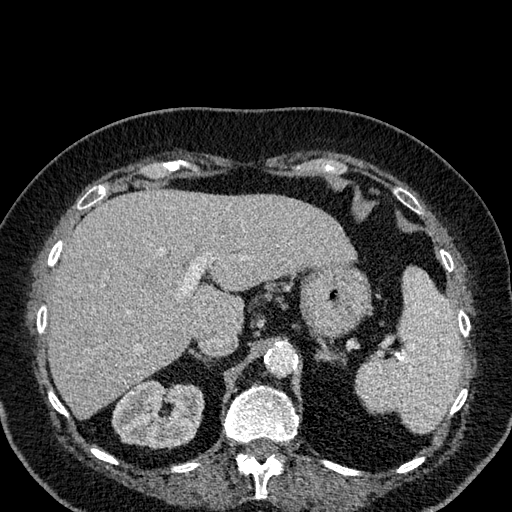
[im 11/138  lung]
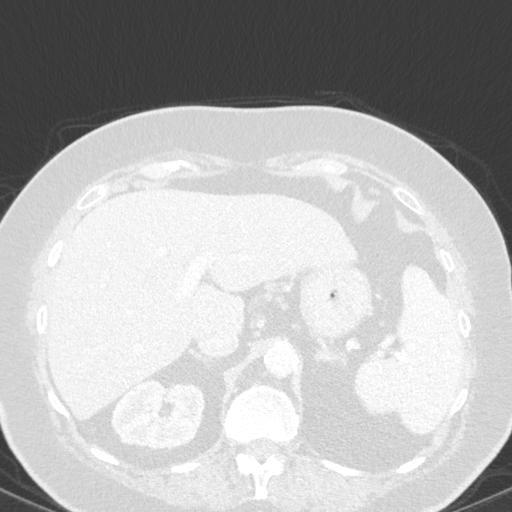
[im 21/138  lung]
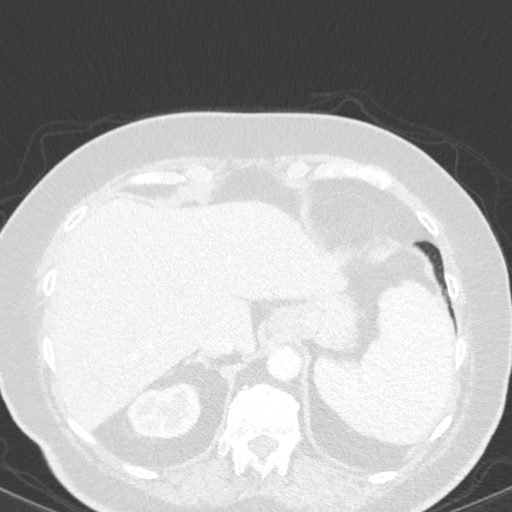
[im 31/138  lung]
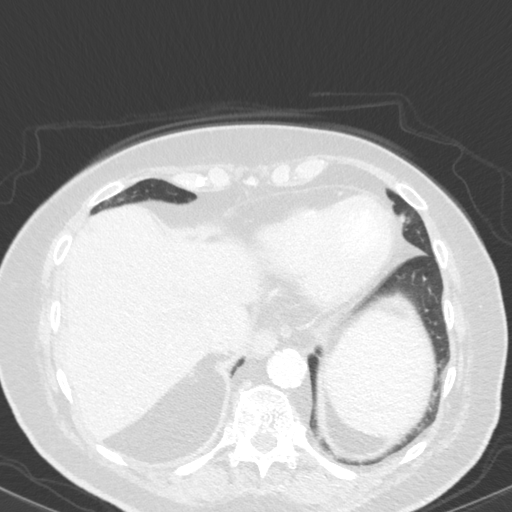
[im 41/138  lung]
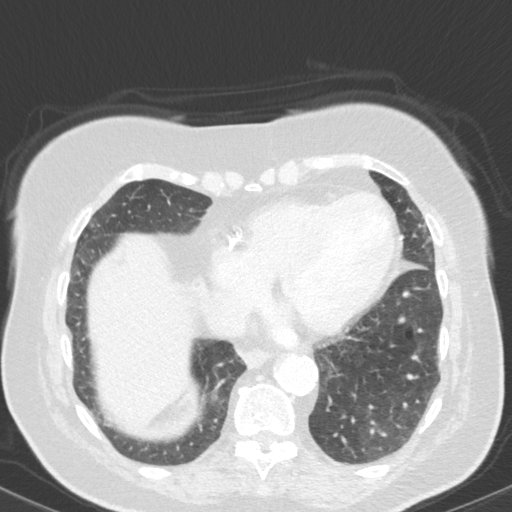
[im 51/138  mediastinal]
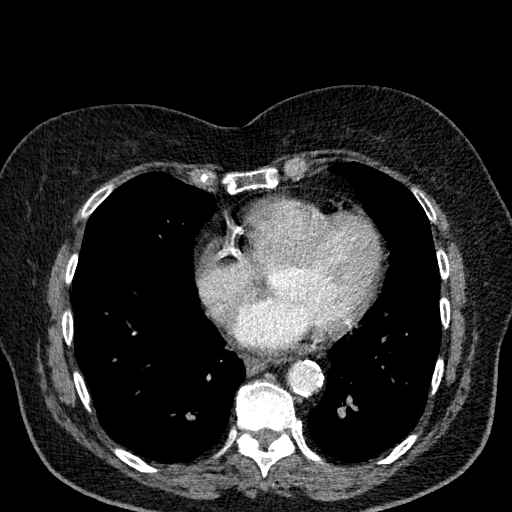
[im 51/138  lung]
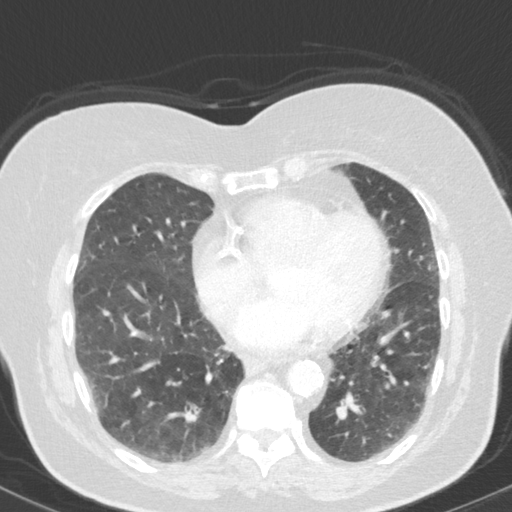
[im 61/138  lung]
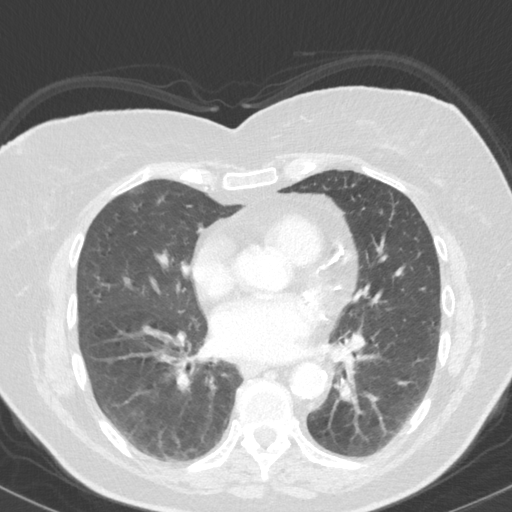
[im 77/138  lung]
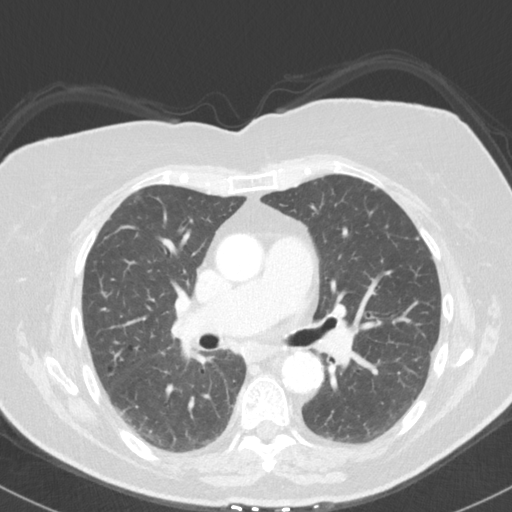
[im 87/138  lung]
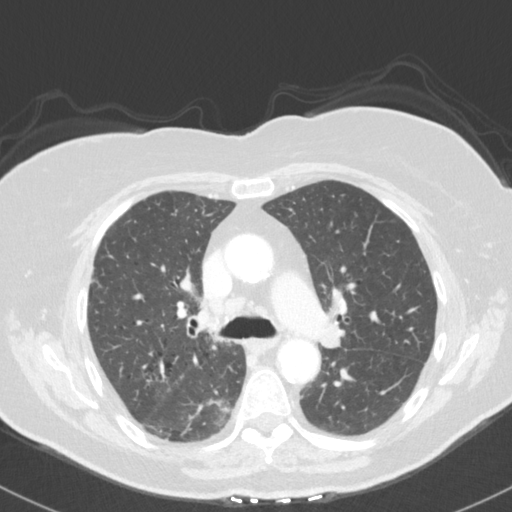
[im 97/138  mediastinal]
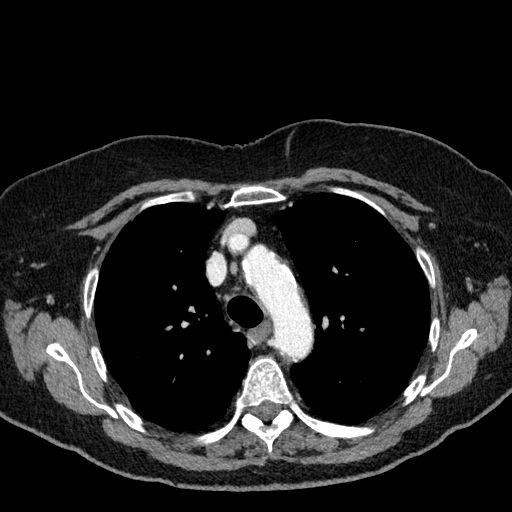
[im 97/138  lung]
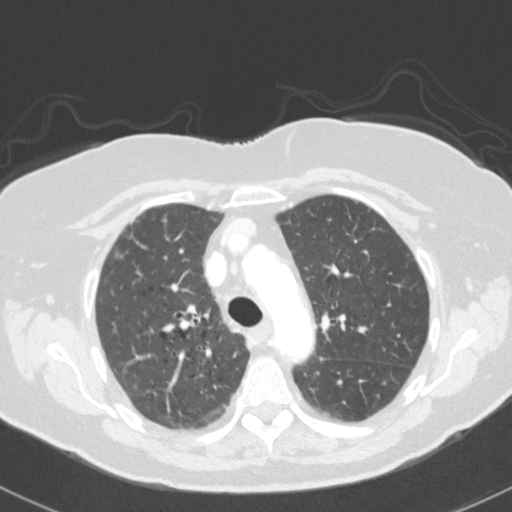
[im 107/138  lung]
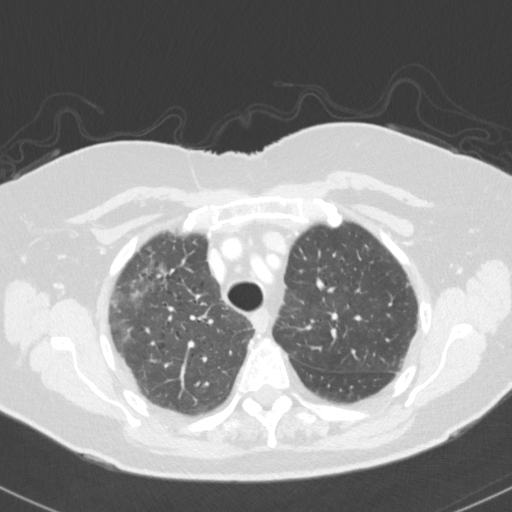
[im 117/138  lung]
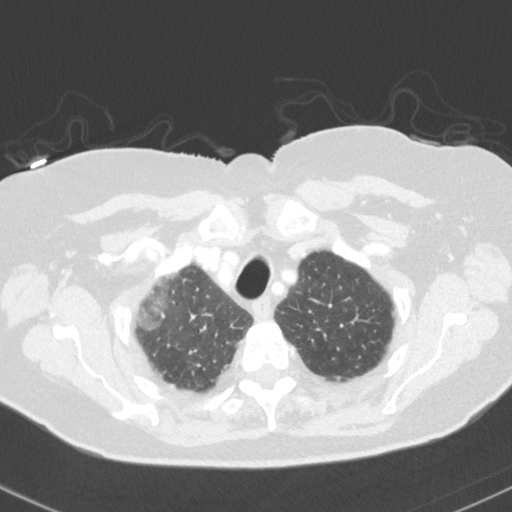
[im 127/138  lung]
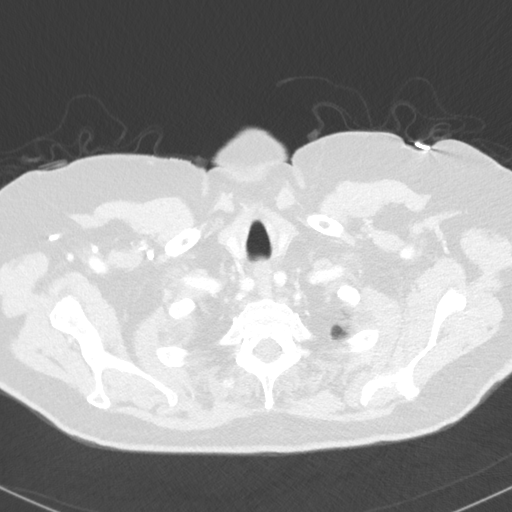

[Series 5: coronal · coronal · 0.57mm/px · 3 of 129 slices shown]
[im 26/129  lung]
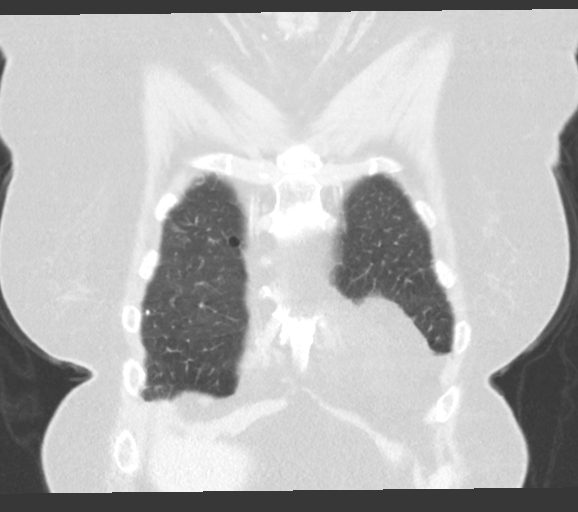
[im 52/129  lung]
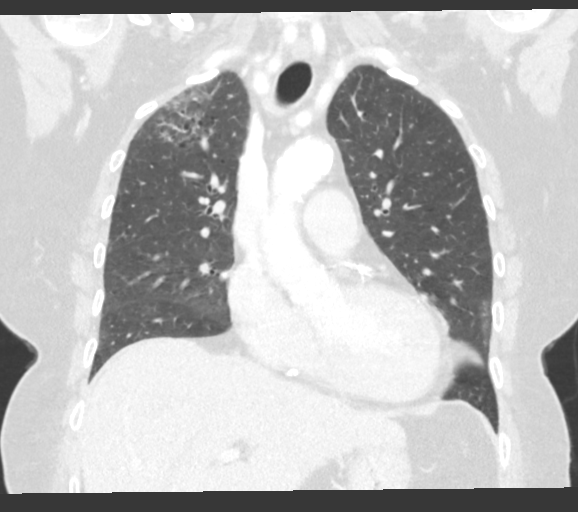
[im 77/129  lung]
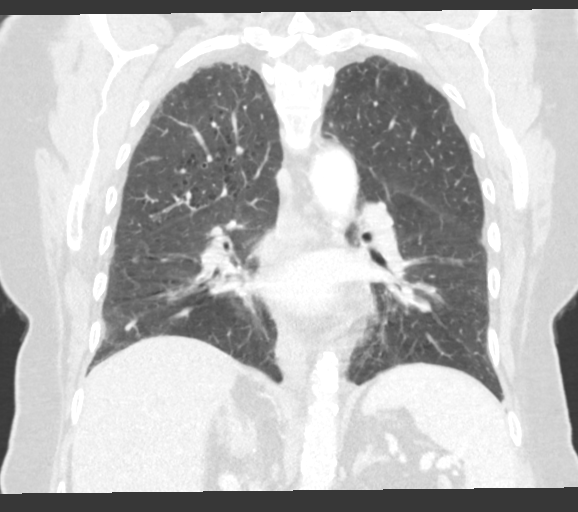

[15 of 36 positions shown; findings below may reference images not displayed]

FINDINGS: Cardiovascular: No central pulmonary embolus. Prominent main
pulmonary arteries greater on the right. Question component of
pulmonary hypertension.

Atherosclerotic changes thoracic aorta without aneurysm or
dissection.

Heart size top-normal.  Prominent coronary artery calcifications.

Mediastinum/Nodes: Scattered normal/top-normal size mediastinal
lymph nodes.

Lungs/Pleura: Interval clearing of left lower lobe
collapse/atelectasis.

Fluctuating ground-glass opacities now most notable within the
superior aspect of the right lower lobe and the superior aspect of
right upper lobe. Question possibility of hypersensitivity
pneumonitis.

8 mm pulmonary nodule superior aspect left lower lobe unchanged from
6256 and therefore most likely benign.

Upper Abdomen: No worrisome abnormality.

Musculoskeletal: No worrisome osseous lesion.
IMPRESSION: Interval clearing of left lower lobe collapse/atelectasis.

Fluctuating ground-glass opacities now most notable within the
superior aspect of the right lower lobe and the superior aspect of
right upper lobe. Question possibility of hypersensitivity
pneumonitis.

8 mm pulmonary nodule superior aspect left lower lobe unchanged from
6256 and therefore most likely benign.

No central pulmonary embolus. Prominence of right and left main
pulmonary artery raise possibility of component of underlying
pulmonary hypertension.

Prominent coronary artery calcifications.

Aortic Atherosclerosis ([id number]).

## 2018-09-01 NOTE — Unmapped (Signed)
Left voicemail asking if Elidel cream helped eczematous rash. She will call to let us know. If Elidel was ineffective, will send in dupilumab.

## 2018-09-02 NOTE — Unmapped (Signed)
Jessica Moses, patient's daughter called to give an update and to answer questions. Jessica Moses stated your message was not understood by her mom. Request that you call her at [phone].Attempted to return call,there was no answer.

## 2018-09-06 NOTE — Unmapped (Signed)
Spoke to Ms. Fellenz's daughter after obtaining Ms. Mandarino's permission permission, as she has difficulty hearing over the phone. Since stopping atorvastatin and using triamcinolone 0.1% ointment instead in lieu of triamcinololne 0.025% cream, her rash has subsided and she no longer feels she needs dupilumab. The Elidel did not help her rash after applying it twice daily. Asked Ms. Palinkas and her daughter to call us back if she feels rash is worsening and they would like to start dupilumab.

## 2018-09-07 DIAGNOSIS — M170 Bilateral primary osteoarthritis of knee: Secondary | ICD-10-CM | POA: Insufficient documentation

## 2018-09-08 ENCOUNTER — Ambulatory Visit: Payer: Self-pay | Admitting: Adult Health

## 2018-10-27 ENCOUNTER — Ambulatory Visit: Payer: Self-pay | Admitting: Nurse Practitioner

## 2018-11-22 ENCOUNTER — Emergency Department: Payer: Medicare Other

## 2018-11-22 ENCOUNTER — Inpatient Hospital Stay: Payer: Medicare Other

## 2018-11-22 ENCOUNTER — Encounter: Payer: Self-pay | Admitting: Emergency Medicine

## 2018-11-22 ENCOUNTER — Other Ambulatory Visit: Payer: Self-pay

## 2018-11-22 ENCOUNTER — Inpatient Hospital Stay
Admission: EM | Admit: 2018-11-22 | Discharge: 2018-11-24 | DRG: 305 | Disposition: A | Discharge status: Home / Self Care | Payer: Medicare Other | Attending: Nurse Practitioner | Admitting: Nurse Practitioner

## 2018-11-22 DIAGNOSIS — I161 Hypertensive emergency: Principal | ICD-10-CM | POA: Diagnosis present

## 2018-11-22 DIAGNOSIS — N183 Chronic kidney disease, stage 3 (moderate): Secondary | ICD-10-CM | POA: Diagnosis present

## 2018-11-22 DIAGNOSIS — Z79890 Hormone replacement therapy (postmenopausal): Secondary | ICD-10-CM | POA: Diagnosis not present

## 2018-11-22 DIAGNOSIS — I129 Hypertensive chronic kidney disease with stage 1 through stage 4 chronic kidney disease, or unspecified chronic kidney disease: Secondary | ICD-10-CM | POA: Diagnosis present

## 2018-11-22 DIAGNOSIS — E1122 Type 2 diabetes mellitus with diabetic chronic kidney disease: Secondary | ICD-10-CM | POA: Diagnosis present

## 2018-11-22 DIAGNOSIS — I248 Other forms of acute ischemic heart disease: Secondary | ICD-10-CM | POA: Diagnosis present

## 2018-11-22 DIAGNOSIS — Z888 Allergy status to other drugs, medicaments and biological substances status: Secondary | ICD-10-CM

## 2018-11-22 DIAGNOSIS — E785 Hyperlipidemia, unspecified: Secondary | ICD-10-CM | POA: Diagnosis present

## 2018-11-22 DIAGNOSIS — Z880 Allergy status to penicillin: Secondary | ICD-10-CM

## 2018-11-22 DIAGNOSIS — Z8673 Personal history of transient ischemic attack (TIA), and cerebral infarction without residual deficits: Secondary | ICD-10-CM | POA: Diagnosis not present

## 2018-11-22 DIAGNOSIS — E7800 Pure hypercholesterolemia, unspecified: Secondary | ICD-10-CM | POA: Diagnosis present

## 2018-11-22 DIAGNOSIS — I252 Old myocardial infarction: Secondary | ICD-10-CM | POA: Diagnosis not present

## 2018-11-22 DIAGNOSIS — J9611 Chronic respiratory failure with hypoxia: Secondary | ICD-10-CM | POA: Diagnosis present

## 2018-11-22 DIAGNOSIS — Z87891 Personal history of nicotine dependence: Secondary | ICD-10-CM | POA: Diagnosis not present

## 2018-11-22 DIAGNOSIS — Z803 Family history of malignant neoplasm of breast: Secondary | ICD-10-CM

## 2018-11-22 DIAGNOSIS — R42 Dizziness and giddiness: Secondary | ICD-10-CM | POA: Diagnosis not present

## 2018-11-22 DIAGNOSIS — Z8711 Personal history of peptic ulcer disease: Secondary | ICD-10-CM

## 2018-11-22 DIAGNOSIS — R1011 Right upper quadrant pain: Secondary | ICD-10-CM

## 2018-11-22 DIAGNOSIS — E039 Hypothyroidism, unspecified: Secondary | ICD-10-CM | POA: Diagnosis present

## 2018-11-22 DIAGNOSIS — K219 Gastro-esophageal reflux disease without esophagitis: Secondary | ICD-10-CM | POA: Diagnosis present

## 2018-11-22 DIAGNOSIS — G2581 Restless legs syndrome: Secondary | ICD-10-CM | POA: Diagnosis present

## 2018-11-22 DIAGNOSIS — I2510 Atherosclerotic heart disease of native coronary artery without angina pectoris: Secondary | ICD-10-CM | POA: Diagnosis present

## 2018-11-22 DIAGNOSIS — Z955 Presence of coronary angioplasty implant and graft: Secondary | ICD-10-CM | POA: Diagnosis not present

## 2018-11-22 DIAGNOSIS — Z79899 Other long term (current) drug therapy: Secondary | ICD-10-CM | POA: Diagnosis not present

## 2018-11-22 DIAGNOSIS — Z20828 Contact with and (suspected) exposure to other viral communicable diseases: Secondary | ICD-10-CM | POA: Diagnosis present

## 2018-11-22 DIAGNOSIS — Z808 Family history of malignant neoplasm of other organs or systems: Secondary | ICD-10-CM | POA: Diagnosis not present

## 2018-11-22 DIAGNOSIS — I480 Paroxysmal atrial fibrillation: Secondary | ICD-10-CM | POA: Diagnosis present

## 2018-11-22 DIAGNOSIS — Z801 Family history of malignant neoplasm of trachea, bronchus and lung: Secondary | ICD-10-CM | POA: Diagnosis not present

## 2018-11-22 DIAGNOSIS — Z881 Allergy status to other antibiotic agents status: Secondary | ICD-10-CM

## 2018-11-22 DIAGNOSIS — Z7952 Long term (current) use of systemic steroids: Secondary | ICD-10-CM

## 2018-11-22 DIAGNOSIS — I10 Essential (primary) hypertension: Secondary | ICD-10-CM | POA: Diagnosis present

## 2018-11-22 DIAGNOSIS — J449 Chronic obstructive pulmonary disease, unspecified: Secondary | ICD-10-CM | POA: Diagnosis present

## 2018-11-22 LAB — COMPREHENSIVE METABOLIC PANEL
ALT: 15 U/L (ref 0–44)
AST: 17 U/L (ref 15–41)
Albumin: 3.8 g/dL (ref 3.5–5.0)
Alkaline Phosphatase: 76 U/L (ref 38–126)
Anion gap: 10 (ref 5–15)
BUN: 20 mg/dL (ref 8–23)
CO2: 26 mmol/L (ref 22–32)
Calcium: 9.1 mg/dL (ref 8.9–10.3)
Chloride: 103 mmol/L (ref 98–111)
Creatinine, Ser: 0.89 mg/dL (ref 0.44–1.00)
GFR calc Af Amer: >60 mL/min (ref >60)
GFR calc non Af Amer: 58 mL/min — ABNORMAL LOW (ref >60)
Glucose, Bld: 269 mg/dL — ABNORMAL HIGH (ref 70–99)
Potassium: 4.4 mmol/L (ref 3.5–5.1)
Sodium: 139 mmol/L (ref 135–145)
Total Bilirubin: 0.3 mg/dL (ref 0.3–1.2)
Total Protein: 6.8 g/dL (ref 6.5–8.1)

## 2018-11-22 LAB — CBC WITH DIFFERENTIAL/PLATELET
Abs Immature Granulocytes: 0.06 10*3/uL (ref 0.00–0.07)
Basophils Absolute: 0.1 10*3/uL (ref 0.0–0.1)
Basophils Relative: 1 %
Eosinophils Absolute: 0.7 10*3/uL — ABNORMAL HIGH (ref 0.0–0.5)
Eosinophils Relative: 7 %
HCT: 33.1 % — ABNORMAL LOW (ref 36.0–46.0)
Hemoglobin: 9.4 g/dL — ABNORMAL LOW (ref 12.0–15.0)
Immature Granulocytes: 1 %
Lymphocytes Relative: 16 %
Lymphs Abs: 1.6 10*3/uL (ref 0.7–4.0)
MCH: 20.8 pg — ABNORMAL LOW (ref 26.0–34.0)
MCHC: 28.4 g/dL — ABNORMAL LOW (ref 30.0–36.0)
MCV: 73.1 fL — ABNORMAL LOW (ref 80.0–100.0)
Monocytes Absolute: 0.7 10*3/uL (ref 0.1–1.0)
Monocytes Relative: 7 %
Neutro Abs: 6.8 10*3/uL (ref 1.7–7.7)
Neutrophils Relative %: 68 %
Platelets: 329 10*3/uL (ref 150–400)
RBC: 4.53 MIL/uL (ref 3.87–5.11)
RDW: 16.7 % — ABNORMAL HIGH (ref 11.5–15.5)
WBC: 9.8 10*3/uL (ref 4.0–10.5)
nRBC: 0 % (ref 0.0–0.2)

## 2018-11-22 LAB — TROPONIN I
Troponin I: 0.54 ng/mL (ref <0.03)
Troponin I: 0.64 ng/mL (ref <0.03)
Troponin I: 0.57 ng/mL (ref <0.03)

## 2018-11-22 LAB — GLUCOSE, CAPILLARY
Glucose-Capillary: 191 mg/dL — ABNORMAL HIGH (ref 70–99)
Glucose-Capillary: 196 mg/dL — ABNORMAL HIGH (ref 70–99)
Glucose-Capillary: 215 mg/dL — ABNORMAL HIGH (ref 70–99)

## 2018-11-22 LAB — TSH: TSH: 2.396 u[IU]/mL (ref 0.350–4.500)

## 2018-11-22 MED ORDER — GUAIFENESIN ER 600 MG PO TB12
600.0000 mg | ORAL_TABLET | Freq: Two times a day (BID) | ORAL | Status: DC
  Administered 2018-11-24: 600 mg via ORAL
  Filled 2018-11-22 (×5): qty 1

## 2018-11-22 MED ORDER — ALBUTEROL SULFATE (2.5 MG/3ML) 0.083% IN NEBU: 2.5000 mg | INHALATION_SOLUTION | Freq: Four times a day (QID) | RESPIRATORY_TRACT | Status: DC | PRN

## 2018-11-22 MED ORDER — BACID PO TABS
2.0000 | ORAL_TABLET | Freq: Every day | ORAL | Status: DC
  Filled 2018-11-22: qty 2

## 2018-11-22 MED ORDER — LEVOTHYROXINE SODIUM 50 MCG PO TABS
50.0000 ug | ORAL_TABLET | Freq: Every day | ORAL | Status: DC
  Administered 2018-11-22 – 2018-11-24 (×3): 50 ug via ORAL
  Filled 2018-11-22 (×3): qty 1

## 2018-11-22 MED ORDER — METOPROLOL SUCCINATE ER 100 MG PO TB24
100.0000 mg | ORAL_TABLET | Freq: Every day | ORAL | Status: DC
  Administered 2018-11-23 – 2018-11-24 (×2): 100 mg via ORAL
  Filled 2018-11-22 (×2): qty 1

## 2018-11-22 MED ORDER — VITAMIN D3 25 MCG (1000 UNIT) PO TABS
5000.0000 [IU] | ORAL_TABLET | Freq: Every day | ORAL | Status: DC
  Administered 2018-11-23 – 2018-11-24 (×2): 5000 [IU] via ORAL
  Filled 2018-11-22 (×5): qty 5

## 2018-11-22 MED ORDER — GABAPENTIN 100 MG PO CAPS
100.0000 mg | ORAL_CAPSULE | Freq: Every day | ORAL | Status: DC
  Administered 2018-11-22 – 2018-11-23 (×2): 100 mg via ORAL
  Filled 2018-11-22 (×2): qty 1

## 2018-11-22 MED ORDER — ATORVASTATIN CALCIUM 20 MG PO TABS
40.0000 mg | ORAL_TABLET | Freq: Every day | ORAL | Status: DC
  Administered 2018-11-22 – 2018-11-24 (×3): 40 mg via ORAL
  Filled 2018-11-22 (×3): qty 2

## 2018-11-22 MED ORDER — HYDROCODONE-ACETAMINOPHEN 5-325 MG PO TABS
1.0000 | ORAL_TABLET | Freq: Two times a day (BID) | ORAL | Status: DC
  Administered 2018-11-22 – 2018-11-24 (×4): 1 via ORAL
  Filled 2018-11-22 (×4): qty 1

## 2018-11-22 MED ORDER — INSULIN ASPART 100 UNIT/ML ~~LOC~~ SOLN
SUBCUTANEOUS | Status: AC
  Filled 2018-11-22: qty 1

## 2018-11-22 MED ORDER — RISAQUAD PO CAPS
2.0000 | ORAL_CAPSULE | Freq: Every day | ORAL | Status: DC
  Administered 2018-11-23 – 2018-11-24 (×2): 2 via ORAL
  Filled 2018-11-22 (×2): qty 2

## 2018-11-22 MED ORDER — NITROGLYCERIN IN D5W 200-5 MCG/ML-% IV SOLN: 0.0000 ug/min | INTRAVENOUS | Status: DC

## 2018-11-22 MED ORDER — VITAMIN B-12 1000 MCG PO TABS
1000.0000 ug | ORAL_TABLET | Freq: Every day | ORAL | Status: DC
  Administered 2018-11-23 – 2018-11-24 (×2): 1000 ug via ORAL
  Filled 2018-11-22 (×3): qty 1

## 2018-11-22 MED ORDER — INSULIN ASPART 100 UNIT/ML ~~LOC~~ SOLN
0.0000 [IU] | Freq: Every day | SUBCUTANEOUS | Status: DC
  Administered 2018-11-22 – 2018-11-23 (×2): 2 [IU] via SUBCUTANEOUS
  Filled 2018-11-22 (×2): qty 1

## 2018-11-22 MED ORDER — INSULIN ASPART 100 UNIT/ML ~~LOC~~ SOLN
0.0000 [IU] | Freq: Three times a day (TID) | SUBCUTANEOUS | Status: DC
  Administered 2018-11-22 (×2): 2 [IU] via SUBCUTANEOUS
  Administered 2018-11-23: 3 [IU] via SUBCUTANEOUS
  Administered 2018-11-23 – 2018-11-24 (×3): 2 [IU] via SUBCUTANEOUS
  Administered 2018-11-24: 5 [IU] via SUBCUTANEOUS
  Filled 2018-11-22 (×6): qty 1

## 2018-11-22 MED ORDER — HYDROXYZINE HCL 25 MG PO TABS: 25.0000 mg | ORAL_TABLET | Freq: Four times a day (QID) | ORAL | Status: DC | PRN

## 2018-11-22 MED ORDER — PANTOPRAZOLE SODIUM 40 MG PO TBEC
40.0000 mg | DELAYED_RELEASE_TABLET | Freq: Every day | ORAL | Status: DC
  Administered 2018-11-22 – 2018-11-24 (×3): 40 mg via ORAL
  Filled 2018-11-22 (×3): qty 1

## 2018-11-22 MED ORDER — METOPROLOL SUCCINATE ER 50 MG PO TB24
50.0000 mg | ORAL_TABLET | Freq: Every day | ORAL | Status: DC
  Administered 2018-11-22: 12:00:00 50 mg via ORAL
  Filled 2018-11-22: qty 1

## 2018-11-22 MED ORDER — HYDRALAZINE HCL 20 MG/ML IJ SOLN
10.0000 mg | Freq: Once | INTRAMUSCULAR | Status: AC
  Administered 2018-11-22: 05:00:00 10 mg via INTRAVENOUS
  Filled 2018-11-22: qty 1

## 2018-11-22 MED ORDER — IPRATROPIUM-ALBUTEROL 0.5-2.5 (3) MG/3ML IN SOLN: 3.0000 mL | Freq: Four times a day (QID) | RESPIRATORY_TRACT | Status: DC | PRN

## 2018-11-22 MED ORDER — LABETALOL HCL 5 MG/ML IV SOLN
10.0000 mg | INTRAVENOUS | Status: DC | PRN
  Filled 2018-11-22: qty 4

## 2018-11-22 MED ORDER — IOPAMIDOL (ISOVUE-370) INJECTION 76%
100.0000 mL | Freq: Once | INTRAVENOUS | Status: AC | PRN
  Administered 2018-11-22: 10:00:00 100 mL via INTRAVENOUS

## 2018-11-22 MED ORDER — HYDROMORPHONE HCL 1 MG/ML IJ SOLN: 0.5000 mg | Freq: Once | INTRAMUSCULAR | Status: DC

## 2018-11-22 NOTE — ED Notes (Signed)
Suction cath removed at this time

## 2018-11-22 NOTE — ED Notes (Signed)
Pt transported to MRI 

## 2018-11-22 NOTE — ED Notes (Signed)
ED TO INPATIENT HANDOFF REPORT  ED Nurse Name and Phone #: Shaleka Brines [phone]  S Name/Age/Gender Peggy [name] 85 y.o. female Room/Bed: ED19A/ED19A  Code Status   Code Status: Full Code  Home/SNF/Other  Patient oriented x 4  Is this baseline? yes  Triage Complete: Triage complete  Chief Complaint [facility] - Hypertension, dizziness  Triage Note EMS pt to Rm 19 from home with report of 4 days of having HTN at night with dizziness and nausea. Hx of CVA, COPD, HTN and DM.    Allergies Allergies  Allergen Reactions  . Diphenhydramine Rash    AGITATION/DELIRIUM  . Gabapentin Itching    And rash  . Metformin Diarrhea  . Oxybutynin Other (See Comments)    dizzy  . Amlodipine Rash  . Ciprofloxacin Rash  . Lisinopril Rash  . Penicillins Rash    Family states was a "long time ago"  . Saxagliptin Rash    Level of Care/Admitting Diagnosis ED Disposition    ED Disposition Condition [facility] Area: [place] [100120]  Level of Care: Med-Surg [16]  Covid Evaluation: Confirmed COVID Negative  Diagnosis: Uncontrolled hypertension [324401]  Admitting Physician: Rufina Falco [facility] [[id number]]  Attending Physician: [place], [place]  Estimated length of stay: past midnight tomorrow  Certification:: I certify this patient will need inpatient services for at least 2 midnights  PT Class (Do Not Modify): Inpatient [101]  PT Acc Code (Do Not Modify): Private [1]       B Medical/Surgery History Past Medical History:  Diagnosis Date  . A-fib ([place])   . Allergy   . Arthritis   . Diabetes mellitus without complication ([place])   . GERD (gastroesophageal reflux disease)   . Hyperlipidemia   . Hypertension   . TIA (transient ischemic attack)    Past Surgical History:  Procedure Laterality Date  . ABDOMINAL HYSTERECTOMY  1970  . BACK SURGERY  2013  . colectomy Right 10/08/2013   Dr. Marina Gravel  . CORONARY ANGIOPLASTY WITH STENT PLACEMENT        A IV Location/Drains/Wounds Patient Lines/Drains/Airways Status   Active Line/Drains/Airways    Name:   Placement date:   Placement time:   Site:   Days:   Peripheral IV 11/22/18 Right Antecubital   11/22/18    0351    Antecubital   less than 1   Peripheral IV 11/10/18 Left;Posterior Forearm   11/10/18    0641    Forearm   12   External Urinary Catheter   11/22/18    0647    -   less than 1          Intake/Output Last 24 hours No intake or output data in the 24 hours ending 11/22/18 0815  Labs/Imaging Results for orders placed or performed during the hospital encounter of 11/22/18 (from the past 48 hour(s))  CBC with Differential/Platelet     Status: Abnormal   Collection Time: 11/22/18  3:49 AM  Result Value Ref Range   WBC 9.8 4.0 - 10.5 K/uL   RBC 4.53 3.87 - 5.11 MIL/uL   Hemoglobin 9.4 (L) 12.0 - 15.0 g/dL   HCT 33.1 (L) 36.0 - 46.0 %   MCV 73.1 (L) 80.0 - 100.0 fL   MCH 20.8 (L) 26.0 - 34.0 pg   MCHC 28.4 (L) 30.0 - 36.0 g/dL   RDW 16.7 (H) 11.5 - 15.5 %   Platelets 329 150 - 400 K/uL   nRBC 0.0  0.0 - 0.2 %   Neutrophils Relative % 68 %   Neutro Abs 6.8 1.7 - 7.7 K/uL   Lymphocytes Relative 16 %   Lymphs Abs 1.6 0.7 - 4.0 K/uL   Monocytes Relative 7 %   Monocytes Absolute 0.7 0.1 - 1.0 K/uL   Eosinophils Relative 7 %   Eosinophils Absolute 0.7 (H) 0.0 - 0.5 K/uL   Basophils Relative 1 %   Basophils Absolute 0.1 0.0 - 0.1 K/uL   Immature Granulocytes 1 %   Abs Immature Granulocytes 0.06 0.00 - 0.07 K/uL    Comment: Performed at [facility], [address] [place], [place] [address]  Comprehensive metabolic panel     Status: Abnormal   Collection Time: 11/22/18  3:49 AM  Result Value Ref Range   Sodium 139 135 - 145 mmol/L   Potassium 4.4 3.5 - 5.1 mmol/L   Chloride 103 98 - 111 mmol/L   CO2 26 22 - 32 mmol/L   Glucose, Bld 269 (H) 70 - 99 mg/dL   BUN 20 8 - 23 mg/dL   Creatinine, Ser 2.56 0.44 - 1.00 mg/dL   Calcium 9.1 8.9 - 38.9 mg/dL    Total Protein 6.8 6.5 - 8.1 g/dL   Albumin 3.8 3.5 - 5.0 g/dL   AST 17 15 - 41 U/L   ALT 15 0 - 44 U/L   Alkaline Phosphatase 76 38 - 126 U/L   Total Bilirubin 0.3 0.3 - 1.2 mg/dL   GFR calc non Af Amer 58 (L) >60 mL/min   GFR calc Af Amer >60 >60 mL/min   Anion gap 10 5 - 15    Comment: Performed at [facility], [address] [place], [place] [address]  Troponin I - ONCE - STAT     Status: Abnormal   Collection Time: 11/22/18  3:49 AM  Result Value Ref Range   Troponin I 0.54 (HH) <0.03 ng/mL    Comment: CRITICAL RESULT CALLED TO, READ BACK BY AND VERIFIED WITH LEA FURGURSON AT 0426 ON 11/22/18 RWW Performed at [facility], [address] [place], [place] [address]    Ct Head Wo Contrast  Result Date: 11/22/2018 CLINICAL DATA:  Vertigo EXAM: CT HEAD WITHOUT CONTRAST TECHNIQUE: Contiguous axial images were obtained from the base of the skull through the vertex without intravenous contrast. COMPARISON:  12/09/2016 FINDINGS: Brain: There is no mass, hemorrhage or extra-axial collection. There is generalized atrophy without lobar predilection. Hypodensity of the white matter is most commonly associated with chronic microvascular disease. Enlarged perivascular space of the left lentiform nucleus, unchanged. Vascular: No abnormal hyperdensity of the major intracranial arteries or dural venous sinuses. No intracranial atherosclerosis. Skull: The visualized skull base, calvarium and extracranial soft tissues are normal. Sinuses/Orbits: No fluid levels or advanced mucosal thickening of the visualized paranasal sinuses. No mastoid or middle ear effusion. The orbits are normal. IMPRESSION: Chronic small vessel disease and mild volume loss without acute intracranial abnormality. Electronically Signed   By: Deatra Robinson M.D.   On: 11/22/2018 04:13    Pending Labs Unresulted Labs (From admission, onward)    Start     Ordered   11/22/18 0442  Novel Coronavirus,NAA,(SEND-OUT TO  REF LAB - TAT 24-48 hrs); Hosp Order  (Asymptomatic Patients Labs)  Once,   STAT    Question:  Rule Out  Answer:  Yes   11/22/18 0441          Vitals/Pain Today's Vitals   11/22/18 0530  11/22/18 0640 11/22/18 0730 11/22/18 0745  BP: (!) 186/67 (!) 151/45  (!) 186/60  Pulse: 69 71  71  Resp: (!) 25 (!) 32  15  Temp:      TempSrc:      SpO2: 100% 100%  100%  Weight:      Height:      PainSc: 0-No pain  0-No pain     Isolation Precautions No active isolations  Medications Medications  nitroGLYCERIN 50 mg in dextrose 5 % 250 mL (0.2 mg/mL) infusion (0 mcg/min Intravenous Hold 11/22/18 0533)  HYDROcodone-acetaminophen (NORCO/VICODIN) 5-325 MG per tablet 1 tablet (has no administration in time range)  atorvastatin (LIPITOR) tablet 40 mg (has no administration in time range)  metoprolol succinate (TOPROL-XL) 24 hr tablet 50 mg (has no administration in time range)  hydrOXYzine (ATARAX/VISTARIL) tablet 25 mg (has no administration in time range)  levothyroxine (SYNTHROID) tablet 50 mcg (has no administration in time range)  lactobacillus acidophilus (BACID) tablet 2 tablet (has no administration in time range)  pantoprazole (PROTONIX) EC tablet 40 mg (has no administration in time range)  vitamin B-12 (CYANOCOBALAMIN) tablet 1,000 mcg (has no administration in time range)  gabapentin (NEURONTIN) capsule 100 mg (has no administration in time range)  Vitamin D3 TABS 5,000 Units (has no administration in time range)  guaiFENesin (MUCINEX) 12 hr tablet 600 mg (has no administration in time range)  ipratropium-albuterol (DUONEB) 0.5-2.5 (3) MG/3ML nebulizer solution 3 mL (has no administration in time range)  albuterol (VENTOLIN HFA) 108 (90 Base) MCG/ACT inhaler 2 puff (has no administration in time range)  HYDROmorphone (DILAUDID) injection 0.5 mg (has no administration in time range)  hydrALAZINE (APRESOLINE) injection 10 mg (10 mg Intravenous Given 11/22/18 0434)    Mobility High  fall risk(due to dizziness)   Focused Assessments    R Recommendations: See Admitting Provider Note  Report given to:   Additional Notes:

## 2018-11-22 NOTE — ED Provider Notes (Signed)
[facility] Emergency Department Provider Note  ____________________________________________  Time seen: Approximately 4:07 AM  I have reviewed the triage vital signs and the nursing notes.   HISTORY  Chief Complaint Dizziness and Hypertension   HPI Peggy [name] is a 85 y.o. female with a history of A. fib, diabetes, hypertension, hyperlipidemia, TIAs, CVA who presents for evaluation of dizziness and elevated blood pressure.  Patient reports that her blood pressure at home is usually in the 150s.  Over the last 4 nights patient has had dizziness which she describes as feeling off balance.  She reports that she feels well during the day, goes to bed with no dizziness.  She usually wakes up 2 hours after going to bed and when she does she feels extremely dizzy.  No lightheadedness or feeling like she is going to pass out.  The dizziness associated with nausea.  She checks her blood pressure which is elevated (around 200 systolic).  She reports that when she wakes up in the morning the dizziness is present but very mild and after 30 minutes of being up the dizziness goes away completely.  She is able to go on with her day without any trouble until the following night.  She denies slurred speech, facial droop, difficulty finding words, unilateral weakness or numbness, headache, chest pain, fever, vomiting or diarrhea.  Past Medical History:  Diagnosis Date   A-fib [facility](HCC)    Allergy    Arthritis    Diabetes mellitus without complication (HCC)    GERD (gastroesophageal reflux disease)    Hyperlipidemia    Hypertension    TIA (transient ischemic attack)     Patient Active Problem List   Diagnosis Date Noted   Chronic respiratory failure with hypoxia (HCC) 10/08/2017   Simple chronic bronchitis (HCC) 10/08/2017   SOB (shortness of breath) 09/24/2017   History of stroke 08/17/2017   Acute respiratory failure with hypoxia and hypercarbia (HCC) 08/07/2017    Respiratory failure (HCC) 08/07/2017   History of GI bleed 07/07/2017   Atrial fibrillation with RVR (HCC) 06/30/2017   Bilateral carotid artery disease (HCC) 04/14/2017   Healthcare maintenance 04/14/2017   Dizziness 04/07/2017   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 02/23/2017   TIA (transient ischemic attack) 12/10/2016   CVA (cerebral vascular accident) (HCC) 12/09/2016   Bilateral arm pain 11/27/2016   Myocardial infarction (HCC) 07/30/2015   Urinary frequency 02/09/2015   RUQ abdominal pain 01/26/2015   Shortness of breath 01/26/2015   Anxiety 01/26/2015   Chronic pruritus 12/21/2014   Cerebral vascular accident (HCC) 11/16/2014   Chronic low back pain 11/16/2014   Adult hypothyroidism 11/15/2014   Allergic rhinitis 11/15/2014   Body mass index (BMI) of 28.0-28.9 in adult 11/15/2014   Arthritis 11/15/2014   Cramps of lower extremity 11/15/2014   Essential (primary) hypertension 11/15/2014   Acid reflux 11/15/2014   Hypercholesteremia 11/15/2014   Cannot sleep 11/15/2014   Psoriasis 11/15/2014   Itch of skin 11/15/2014   Restless leg 11/15/2014   Diabetes mellitus, type 2 (HCC) 11/15/2014   Breath shortness 11/15/2014   Dermatitis due to unknown cause 04/10/2014   Arteriosclerosis of coronary artery 02/20/2014   AF (paroxysmal atrial fibrillation) (HCC) 02/20/2014   Temporary cerebral vascular dysfunction 02/20/2014   DD (diverticular disease) 10/05/2013    Past Surgical History:  Procedure Laterality Date   ABDOMINAL HYSTERECTOMY  1970   BACK SURGERY  2013   colectomy Right 10/08/2013   Dr. Egbert GaribaldiBird  CORONARY ANGIOPLASTY WITH STENT PLACEMENT      Prior to Admission medications   Medication Sig Start Date End Date Taking? Authorizing Provider  Cholecalciferol (VITAMIN D3) 5000 units TABS Take 5,000 Units by mouth daily.   Yes [provider]  gabapentin (NEURONTIN) 100 MG capsule Take 100 mg by mouth at  bedtime.   Yes [provider]  HUMULIN 70/30 KWIKPEN (70-30) 100 UNIT/ML PEN Inject 20 Units into the skin 2 (two) times daily. Patient taking differently: Inject 25 Units into the skin 2 (two) times daily.  09/26/17  Yes Enedina FinnerPatel, Sona, MD  HYDROcodone-acetaminophen (NORCO/VICODIN) 5-325 MG tablet Take 1 tablet by mouth 2 (two) times daily. As needed for chronic low back pain 07/18/15  Yes Lorie PhenixMaloney, Nancy, MD  hydrOXYzine (ATARAX/VISTARIL) 25 MG tablet Take 25 mg by mouth 4 (four) times daily as needed for itching. 06/05/17  Yes [provider]  lactobacillus acidophilus (BACID) TABS tablet Take 2 tablets by mouth daily.   Yes [provider]  levothyroxine (SYNTHROID, LEVOTHROID) 50 MCG tablet Take 1 tablet (50 mcg total) by mouth daily. PATIENT NEEDS TO SCHEDULE OFFICE VISIT FOR FOLLOW UP 02/01/16  Yes Malva LimesFisher, Donald E, MD  metoprolol succinate (TOPROL-XL) 50 MG 24 hr tablet Take 50 mg by mouth daily. Take with or immediately following a meal.   Yes [provider]  pantoprazole (PROTONIX) 40 MG tablet TAKE 1 TABLET(40 MG) BY MOUTH DAILY 07/22/18  Yes Johnna AcostaScarboro, Adam J, NP  Potassium 99 MG TABS Take 1 tablet by mouth daily.   Yes [provider]  vitamin B-12 (CYANOCOBALAMIN) 1000 MCG tablet Take 1,000 mcg by mouth daily.   Yes [provider]  amiodarone (PACERONE) 400 MG tablet Take 0.5 tablets (200 mg total) by mouth daily. Patient not taking: Reported on 11/22/2018 09/26/17   Enedina FinnerPatel, Sona, MD  apixaban (ELIQUIS) 5 MG TABS tablet Take 1 tablet (5 mg total) by mouth 2 (two) times daily. Patient not taking: Reported on 11/22/2018 09/26/17   Enedina FinnerPatel, Sona, MD  atorvastatin (LIPITOR) 40 MG tablet Take 1 tablet (40 mg total) by mouth daily at 6 PM. Patient not taking: Reported on 11/22/2018 12/10/16   Alford HighlandWieting, Richard, MD  budesonide-formoterol [facility](SYMBICORT) 80-4.5 MCG/ACT inhaler Inhale 1 puff into the lungs 2 (two) times daily. Patient not taking: Reported on  11/22/2018 05/12/18   Johnna AcostaScarboro, Adam J, NP  chlorpheniramine-HYDROcodone [facility](TUSSIONEX PENNKINETIC ER) 10-8 MG/5ML SUER Take 5 mLs by mouth every 12 (twelve) hours as needed for cough. Patient not taking: Reported on 11/22/2018 07/22/18   Johnna AcostaScarboro, Adam J, NP  fenofibrate (TRICOR) 145 MG tablet Take 1 tablet (145 mg total) by mouth daily. Patient not taking: Reported on 11/22/2018 09/10/15   Lorie PhenixMaloney, Nancy, MD  fluticasone [facility](FLONASE) 50 MCG/ACT nasal spray USE [facility] SPRAY [facility] DAILY Patient not taking: Reported on 11/22/2018 09/27/15   Lorie PhenixMaloney, Nancy, MD  guaiFENesin (MUCINEX) 600 MG 12 hr tablet Take by mouth 2 (two) times daily.    [provider]  guaiFENesin-codeine 100-10 MG/5ML syrup Take 10 mLs by mouth at bedtime. Patient not taking: Reported on 11/22/2018 09/26/17   Enedina FinnerPatel, Sona, MD  ipratropium-albuterol (DUONEB) 0.5-2.5 (3) MG/3ML SOLN Take 3 mLs by nebulization every 6 (six) hours as needed. Patient not taking: Reported on 11/22/2018 08/14/17   Auburn BilberryPatel, Shreyang, MD  OXYGEN Inhale 2 L into the lungs.    [provider]  predniSONE (DELTASONE) 20 MG tablet  07/06/18   [provider]  VENTOLIN HFA 108 (  90 Base) MCG/ACT inhaler Inhale 2 puffs into the lungs every 6 (six) hours as needed. 05/24/17   [provider]    Allergies Diphenhydramine, Gabapentin, Metformin, Oxybutynin, Amlodipine, Ciprofloxacin, Lisinopril, Penicillins, and Saxagliptin  Family History  Problem Relation Age of Onset   Breast cancer Mother    Pancreatitis Father    Breast cancer Sister    Lung cancer Brother    Melanoma Brother    Throat cancer Brother     Social History Social History   Tobacco Use   Smoking status: Former Smoker    Packs/day: 1.00    Years: 30.00    Pack years: 30.00    Quit date: 11/30/1999    Years since quitting: 18.9   Smokeless tobacco: Never Used  Substance Use Topics   Alcohol use: No   Drug use: No    Review of  Systems  Constitutional: Negative for fever. + Dizziness Eyes: Negative for visual changes. ENT: Negative for sore throat. Neck: No neck pain  Cardiovascular: Negative for chest pain. Respiratory: Negative for shortness of breath. Gastrointestinal: Negative for abdominal pain, vomiting or diarrhea. + nausea Genitourinary: Negative for dysuria. Musculoskeletal: Negative for back pain. Skin: Negative for rash. Neurological: Negative for headaches, weakness or numbness.  Psych: No SI or HI  ____________________________________________   PHYSICAL EXAM:  VITAL SIGNS: ED Triage Vitals  Enc Vitals Group     BP 11/22/18 0326 (!) 235/63     Pulse Rate 11/22/18 0326 66     Resp 11/22/18 0326 19     Temp 11/22/18 0326 (!) 97.5 F (36.4 C)     Temp Source 11/22/18 0326 Oral     SpO2 11/22/18 0326 99 %     Weight 11/22/18 0327 165 lb (74.8 kg)     Height 11/22/18 0327 5\' 2"  (1.575 m)     Head Circumference --      Peak Flow --      Pain Score 11/22/18 0327 6     Pain Loc --      Pain Edu? --      Excl. in GC? --     Constitutional: Alert and oriented. Well appearing and in no apparent distress. HEENT:      Head: Normocephalic and atraumatic.         Eyes: Conjunctivae are normal. Sclera is non-icteric.       Mouth/Throat: Mucous membranes are moist.       Neck: Supple with no signs of meningismus. Cardiovascular: Regular rate and rhythm. No murmurs, gallops, or rubs. 2+ symmetrical distal pulses are present in all extremities. No JVD. Respiratory: Normal respiratory effort. Lungs are clear to auscultation bilaterally. No wheezes, crackles, or rhonchi.  Gastrointestinal: Soft, non tender, and non distended with positive bowel sounds. No rebound or guarding. Musculoskeletal: Nontender with normal range of motion in all extremities. No edema, cyanosis, or erythema of extremities. Neurologic: Normal speech and language.  Face symmetric, extraocular movements are intact, pupils are  equal and reactive, no nystagmus, intact strength and sensation x4, no pronator drift, no dysmetria Skin: Skin is warm, dry and intact. No rash noted. Psychiatric: Mood and affect are normal. Speech and behavior are normal.  ____________________________________________   LABS (all labs ordered are listed, but only abnormal results are displayed)  Labs Reviewed  CBC WITH DIFFERENTIAL/PLATELET - Abnormal; Notable for the following components:      Result Value   Hemoglobin 9.4 (*)    HCT 33.1 (*)    MCV  73.1 (*)    MCH 20.8 (*)    MCHC 28.4 (*)    RDW 16.7 (*)    Eosinophils Absolute 0.7 (*)    All other components within normal limits  COMPREHENSIVE METABOLIC PANEL - Abnormal; Notable for the following components:   Glucose, Bld 269 (*)    GFR calc non Af Amer 58 (*)    All other components within normal limits  TROPONIN I - Abnormal; Notable for the following components:   Troponin I 0.54 (*)    All other components within normal limits  NOVEL CORONAVIRUS, NAA (HOSPITAL ORDER, SEND-OUT TO REF LAB)   ____________________________________________  EKG  ED ECG REPORT I, Rudene Re, the attending physician, personally viewed and interpreted this ECG.  Normal sinus rhythm, rate of 69, normal intervals, normal axis, no ST elevations or depressions. ____________________________________________  RADIOLOGY  I have personally reviewed the images performed during this visit and I agree with the Radiologist's read.   Interpretation by Radiologist:  Ct Head Wo Contrast  Result Date: 11/22/2018 CLINICAL DATA:  Vertigo EXAM: CT HEAD WITHOUT CONTRAST TECHNIQUE: Contiguous axial images were obtained from the base of the skull through the vertex without intravenous contrast. COMPARISON:  12/09/2016 FINDINGS: Brain: There is no mass, hemorrhage or extra-axial collection. There is generalized atrophy without lobar predilection. Hypodensity of the white matter is most commonly  associated with chronic microvascular disease. Enlarged perivascular space of the left lentiform nucleus, unchanged. Vascular: No abnormal hyperdensity of the major intracranial arteries or dural venous sinuses. No intracranial atherosclerosis. Skull: The visualized skull base, calvarium and extracranial soft tissues are normal. Sinuses/Orbits: No fluid levels or advanced mucosal thickening of the visualized paranasal sinuses. No mastoid or middle ear effusion. The orbits are normal. IMPRESSION: Chronic small vessel disease and mild volume loss without acute intracranial abnormality. Electronically Signed   By: Ulyses Jarred M.D.   On: 11/22/2018 04:13      ____________________________________________   PROCEDURES  Procedure(s) performed: None Procedures Critical Care performed: yes  CRITICAL CARE Performed by: Rudene Re  ?  Total critical care time: 35 min  Critical care time was exclusive of separately billable procedures and treating other patients.  Critical care was necessary to treat or prevent imminent or life-threatening deterioration.  Critical care was time spent personally by me on the following activities: development of treatment plan with patient and/or surrogate as well as nursing, discussions with consultants, evaluation of patient's response to treatment, examination of patient, obtaining history from patient or surrogate, ordering and performing treatments and interventions, ordering and review of laboratory studies, ordering and review of radiographic studies, pulse oximetry and re-evaluation of patient's condition.  ____________________________________________   INITIAL IMPRESSION / ASSESSMENT AND PLAN / ED COURSE   85 y.o. female with a history of A. fib, diabetes, hypertension, hyperlipidemia, TIAs, CVA who presents for evaluation of dizziness and elevated blood pressure intermittently for the last 4 days.  Patient only takes metoprolol for her blood  pressure.  Took an extra dose an hour before coming to the hospital.  Neurologically intact.  Vitals show BP of 235/63 with a pulse in the 60s.  Differential diagnosis including vertigo versus posterior fossa stroke versus dehydration versus electrolyte abnormalities versus dysrhythmias.  EKG, labs, head CT pending. Will give IV hydralazine.    _________________________ 4:36 AM on 11/22/2018 -----------------------------------------  EKG with no ischemic changes.  Troponin is elevated at 0.54.  Patient denies any episode of chest pain or shortness of breath.  Therefore this  is most likely due to demand ischemia in the setting of hypertensive emergency.  Head CT negative. Stable anemia, normal electrolytes. After hydralazine, BP improved to systolics in the 160s. Will admit for hypertensive emergency.   _________________________ 7:24 AM on 11/22/2018 -----------------------------------------  BP trending up again. Will start nitro gtt. Patient admitted awaiting bed.   As part of my medical decision making, I reviewed the following data within the electronic MEDICAL RECORD NUMBER Nursing notes reviewed and incorporated, Labs reviewed , EKG interpreted , Old EKG reviewed, Old chart reviewed, Radiograph reviewed , Discussed with admitting physician , Notes from prior ED visits and  Controlled Substance Database    Pertinent labs & imaging results that were available during my care of the patient were reviewed by me and considered in my medical decision making (see chart for details).    ____________________________________________   FINAL CLINICAL IMPRESSION(S) / ED DIAGNOSES  Final diagnoses:  Dizziness  Demand ischemia [facility]  Hypertensive emergency      NEW MEDICATIONS STARTED DURING THIS VISIT:  ED Discharge Orders    None       Note:  This document was prepared using Dragon voice recognition software and may include unintentional dictation errors.    Don Perking, [place],  MD 11/22/18 [phone]

## 2018-11-22 NOTE — ED Notes (Signed)
Called pt's daughter Sharyn Lull and updated her on pt's room assignment.

## 2018-11-22 NOTE — ED Notes (Signed)
Repeat EKG performed per Admitting MD. EKG given to MD Isaacs.

## 2018-11-22 NOTE — ED Notes (Signed)
Took pt to the toilet, pt had a bowel movement. Pt is back in bed resting, but complained of arm hurting. The wrap from MRI was very tight and restricted flow. Her arm was very swollen with blistering,so I removed wrap and I reported to [employer].

## 2018-11-22 NOTE — Progress Notes (Signed)
Peggy [name]    [id number]   1928/12/08  Type of procedure: Angio Chest/Ab/Pelvis CT  11/22/2018  During [facility] some of the x-ray dye leaked into the tissues around the vein that the x-ray dye was given through.  What should you do?  1. Apply ice 20 minutes four times a day for three days.  2. Keep the affected extremity elevated above the rest of your body for 48 hours.  3. If you develop any of the following signs or symptoms, please contact Radiology at [phone].  Increased pain  Increasing swelling   Change in sensation (ex. Numbness, tingling)  Development of redness or increase in warmth around the affected area  Increasing hardness  Blistering   I have read and understand these instructions.  Any questions I raised have been discussed to my satisfaction.  I understand that failure to follow these instructions may result in additional complications.

## 2018-11-22 NOTE — Consult Note (Signed)
Reason for Consult: Hypertensive urgency vertigo Referring Physician: Webb SilversmithElizabeth [name] Cardiologist Dr. Corwin LevinsParaschos  Peggy [name] is an 85 y.o. female.  HPI: Patient is a 85 year old female history of paroxysmal atrial fibrillation history of peptic ulcer disease patient had rectal bleeding and nosebleeding anticoagulation was discontinued presented with hypertensive urgency and vertigo denies any chest pain denies any worsening shortness of breath.  Patient has diabetes hypertension hyperlipidemia states to been compliant with her medication has had improvement in blood pressure now feels much improved  Past Medical History:  Diagnosis Date  . A-fib (HCC)   . Allergy   . Arthritis   . Diabetes mellitus without complication (HCC)   . GERD (gastroesophageal reflux disease)   . Hyperlipidemia   . Hypertension   . TIA (transient ischemic attack)     Past Surgical History:  Procedure Laterality Date  . ABDOMINAL HYSTERECTOMY  1970  . BACK SURGERY  2013  . colectomy Right 10/08/2013   Dr. Egbert GaribaldiBird  . CORONARY ANGIOPLASTY WITH STENT PLACEMENT      Family History  Problem Relation Age of Onset  . Breast cancer Mother   . Pancreatitis Father   . Breast cancer Sister   . Lung cancer Brother   . Melanoma Brother   . Throat cancer Brother     Social History:  reports that she quit smoking about 18 years ago. She has a 30.00 pack-year smoking history. She has never used smokeless tobacco. She reports that she does not drink alcohol or use drugs.  Allergies:  Allergies  Allergen Reactions  . Diphenhydramine Rash    AGITATION/DELIRIUM  . Gabapentin Itching    And rash  . Metformin Diarrhea  . Oxybutynin Other (See Comments)    dizzy  . Amlodipine Rash  . Ciprofloxacin Rash  . Lisinopril Rash  . Penicillins Rash    Family states was a "long time ago"  . Saxagliptin Rash    Medications: I have reviewed the patient's current medications.  Results for orders placed or performed during  the hospital encounter of 11/22/18 (from the past 48 hour(s))  CBC with Differential/Platelet     Status: Abnormal   Collection Time: 11/22/18  3:49 AM  Result Value Ref Range   WBC 9.8 4.0 - 10.5 K/uL   RBC 4.53 3.87 - 5.11 MIL/uL   Hemoglobin 9.4 (L) 12.0 - 15.0 g/dL   HCT 16.133.1 (L) 09.636.0 - 04.546.0 %   MCV 73.1 (L) 80.0 - 100.0 fL   MCH 20.8 (L) 26.0 - 34.0 pg   MCHC 28.4 (L) 30.0 - 36.0 g/dL   RDW 40.916.7 (H) 81.111.5 - 91.415.5 %   Platelets 329 150 - 400 K/uL   nRBC 0.0 0.0 - 0.2 %   Neutrophils Relative % 68 %   Neutro Abs 6.8 1.7 - 7.7 K/uL   Lymphocytes Relative 16 %   Lymphs Abs 1.6 0.7 - 4.0 K/uL   Monocytes Relative 7 %   Monocytes Absolute 0.7 0.1 - 1.0 K/uL   Eosinophils Relative 7 %   Eosinophils Absolute 0.7 (H) 0.0 - 0.5 K/uL   Basophils Relative 1 %   Basophils Absolute 0.1 0.0 - 0.1 K/uL   Immature Granulocytes 1 %   Abs Immature Granulocytes 0.06 0.00 - 0.07 K/uL    Comment: Performed at [facility], [address] [place], [place] [address]  Comprehensive metabolic panel     Status: Abnormal   Collection Time: 11/22/18  3:49 AM  Result Value Ref Range  Sodium 139 135 - 145 mmol/L   Potassium 4.4 3.5 - 5.1 mmol/L   Chloride 103 98 - 111 mmol/L   CO2 26 22 - 32 mmol/L   Glucose, Bld 269 (H) 70 - 99 mg/dL   BUN 20 8 - 23 mg/dL   Creatinine, Ser 0.89 0.44 - 1.00 mg/dL   Calcium 9.1 8.9 - 10.210.3 mg/dL   Total Protein 6.8 6.5 - 8.1 g/dL   Albumin 3.8 3.5 - 5.0 g/dL   AST 17 15 - 41 U/L   ALT 15 0 - 44 U/L   Alkaline Phosphatase 76 38 - 126 U/L   Total Bilirubin 0.3 0.3 - 1.2 mg/dL   GFR calc non Af Amer 58 (L) >60 mL/min   GFR calc Af Amer >60 >60 mL/min   Anion gap 10 5 - 15  4.40  Comment: Performed at [facility], [address] [place], [place] [address]  Troponin I - ONCE - STAT     Status: Abnormal   Collection Time: 11/22/18  3:49 AM  Result Value Ref Range   Troponin I 0.54 (HH) <0.03 ng/mL    Comment: CRITICAL RESULT CALLED TO, READ BACK  BY AND VERIFIED WITH LEA FURGURSON AT 0426 ON 11/22/18 RWW Performed at [facility], [address] [place], [place] [address]   TSH     Status: None   Collection Time: 11/22/18 11:15 AM  Result Value Ref Range   TSH 2.396 0.350 - 4.500 uIU/mL    Comment: Performed by a 3rd Generation assay with a functional sensitivity of <=0.01 uIU/mL. Performed at [facility], [address] [place], [place] [address]   Troponin I - Now Then Q6H     Status: Abnormal   Collection Time: 11/22/18 11:36 AM  Result Value Ref Range   Troponin I 0.64 (HH) <0.03 ng/mL    Comment: CRITICAL VALUE NOTED. VALUE IS CONSISTENT WITH PREVIOUSLY REPORTED/CALLED VALUE DAS Performed at [facility], [address] [place], [place] [address]   Glucose, capillary     Status: Abnormal   Collection Time: 11/22/18 12:53 PM  Result Value Ref Range   Glucose-Capillary 191 (H) 70 - 99 mg/dL  Troponin I - Now Then Q6H     Status: Abnormal   Collection Time: 11/22/18  5:58 PM  Result Value Ref Range   Troponin I 0.57 (HH) <0.03 ng/mL    Comment: CRITICAL VALUE NOTED. VALUE IS CONSISTENT WITH PREVIOUSLY REPORTED/CALLED VALUE/TFK Performed at [facility], [address] [place], [place] [address]   Glucose, capillary     Status: Abnormal   Collection Time: 11/22/18  6:07 PM  Result Value Ref Range   Glucose-Capillary 196 (H) 70 - 99 mg/dL  Glucose, capillary     Status: Abnormal   Collection Time: 11/22/18  9:21 PM  Result Value Ref Range   Glucose-Capillary 215 (H) 70 - 99 mg/dL    Ct Head Wo Contrast  Result Date: 11/22/2018 CLINICAL DATA:  Vertigo EXAM: CT HEAD WITHOUT CONTRAST TECHNIQUE: Contiguous axial images were obtained from the base of the skull through the vertex without intravenous contrast. COMPARISON:  12/09/2016 FINDINGS: Brain: There is no mass, hemorrhage or extra-axial collection. There is generalized atrophy without lobar predilection. Hypodensity of the  white matter is most commonly associated with chronic microvascular disease. Enlarged perivascular space of the left lentiform nucleus, unchanged. Vascular: No abnormal hyperdensity of the major intracranial arteries or dural venous sinuses. No intracranial atherosclerosis. Skull: The  visualized skull base, calvarium and extracranial soft tissues are normal. Sinuses/Orbits: No fluid levels or advanced mucosal thickening of the visualized paranasal sinuses. No mastoid or middle ear effusion. The orbits are normal. IMPRESSION: Chronic small vessel disease and mild volume loss without acute intracranial abnormality. Electronically Signed   By: Ulyses Jarred M.D.   On: 11/22/2018 04:13   Mr Brain Wo Contrast  Result Date: 11/22/2018 CLINICAL DATA:  Persistent central vertigo. Symptoms for 4 days. Dizziness and nausea. EXAM: MRI HEAD WITHOUT CONTRAST TECHNIQUE: Multiplanar, multiecho pulse sequences of the brain and surrounding structures were obtained without intravenous contrast. COMPARISON:  CT head 11/22/2018. MR head 12/09/2016. FINDINGS: Brain: No evidence for acute infarction, hemorrhage, mass lesion, hydrocephalus, or extra-axial fluid. Generalized atrophy, not unexpected for age. T2 and FLAIR hyperintensities throughout the white matter, mild to moderate chronic microvascular ischemic change. Chronic subcortical/periventricular RIGHT frontal infarct. Other smaller deep white matter infarcts centrum semiovale. Perivascular space, LEFT anterior commissure region. Incidental posterior fossa subdural hygromas, versus atrophy related. Vascular: Flow voids are maintained throughout the carotid, basilar, and vertebral arteries. There are no areas of chronic hemorrhage. Skull and upper cervical spine: Unremarkable visualized calvarium, skullbase, and cervical vertebrae. Pituitary, pineal, cerebellar tonsils unremarkable. No upper cervical cord lesions. Mild pannus. Sinuses/Orbits: No orbital masses or proptosis.  Globes appear symmetric. Sinuses appear well aerated, without evidence for air-fluid level. Other: No nasopharyngeal pathology or mastoid fluid. Scalp and other visualized extracranial soft tissues grossly unremarkable. Compared to prior MR, similar appearance. IMPRESSION: Atrophy and small vessel disease. No intracranial findings. No posterior fossa or retrocochlear pathology is identified Electronically Signed   By: Staci Righter M.D.   On: 11/22/2018 11:07   Ct Angio Chest/abd/pel For Dissection W And/or W/wo  Result Date: 11/22/2018 CLINICAL DATA:  Dizziness and hypertension. EXAM: CT ANGIOGRAPHY CHEST, ABDOMEN AND PELVIS TECHNIQUE: Multidetector CT imaging through the chest, abdomen and pelvis was performed using the standard protocol during bolus administration of intravenous contrast. Multiplanar reconstructed images and MIPs were obtained and reviewed to evaluate the vascular anatomy. CONTRAST:  11mL ISOVUE-370 IOPAMIDOL (ISOVUE-370) INJECTION 76% COMPARISON:  CT of the chest with contrast on 09/08/2017 FINDINGS: CTA CHEST FINDINGS Cardiovascular: There was some extravasation of contrast at the right arm injection site which limited opacification of the aorta. However, opacification is felt to be adequate to exclude dissection. The thoracic aorta is of normal caliber and demonstrates no evidence of acute dissection or intramural hemorrhage. Scattered calcified plaque is present. Proximal great vessels demonstrate normal patency. The heart size is normal. No pericardial fluid is identified. Extensive calcified plaque is noted of the coronary arteries in a 3 vessel distribution. Central pulmonary arteries are normal in caliber. Mediastinum/Nodes: Small nonenlarged mediastinal lymph nodes again noted which are smaller than on the prior study. No hilar or axillary lymphadenopathy. Small hiatal hernia present. Lungs/Pleura: Stable 8 mm well-circumscribed nodule in the superior segment of the left lower lobe.  There is no evidence of pulmonary edema, consolidation, pneumothorax, nodule or pleural fluid. Musculoskeletal: No chest wall abnormality. No acute or significant osseous findings. Review of the MIP images confirms the above findings. CTA ABDOMEN AND PELVIS FINDINGS VASCULAR Aorta: There is atherosclerosis of the abdominal aorta without evidence of aneurysm or stenosis. Celiac: Calcified plaque at the origin of the celiac axis without significant stenosis identified. SMA: Calcified plaque at the SMA origin without significant stenosis identified. Renals: Bilateral single renal arteries demonstrate normal patency. IMA: Patent. Inflow: Calcified plaque in bilateral iliac arteries without significant  stenosis or evidence of aneurysmal disease. Review of the MIP images confirms the above findings. NON-VASCULAR Hepatobiliary: Benign-appearing hepatic cyst in the left lobe measures approximately 1.8 x 2.3 cm. No gallstones, gallbladder wall thickening, or biliary dilatation. Pancreas: Unremarkable. No pancreatic ductal dilatation or surrounding inflammatory changes. Spleen: Normal in size without focal abnormality. Adrenals/Urinary Tract: Adrenal glands are unremarkable. Kidneys are normal, without renal calculi, focal lesion, or hydronephrosis. Bladder is unremarkable. Stomach/Bowel: Bowel shows no evidence of obstruction or ileus. There is evidence of prior right hemicolectomy. No free air. No focal masses identified. Lymphatic: No enlarged lymph nodes identified in the abdomen or pelvis. Reproductive: Status post hysterectomy. No adnexal masses. Other: No abdominal wall hernia or abnormality. No abdominopelvic ascites. Musculoskeletal: Mild loss of height of the L1 vertebral body. Degenerative disc disease at L4-5 and L5-S1. Review of the MIP images confirms the above findings. IMPRESSION: 1. No evidence of aortic dissection. Aortic atherosclerosis present without evidence of aortic aneurysm. 2. Coronary  atherosclerosis with extensive calcified plaque in a 3 vessel distribution. 3. Stable 8 mm left lower lobe pulmonary nodule. 4. Atherosclerosis of the abdominal aorta without evidence of aneurysm. 5. Mild loss of height of the L1 vertebral body. Electronically Signed   By: Irish Lack M.D.   On: 11/22/2018 10:52    Review of Systems  Constitutional: Positive for malaise/fatigue.  HENT: Positive for congestion.   Eyes: Negative.   Respiratory: Positive for shortness of breath.   Cardiovascular: Positive for palpitations and leg swelling.  Gastrointestinal: Negative.   Genitourinary: Negative.   Musculoskeletal: Positive for myalgias.  Skin: Negative.   Neurological: Positive for dizziness and weakness.  Endo/Heme/Allergies: Negative.   Psychiatric/Behavioral: Negative.    Blood pressure 93/72, pulse 81, temperature 97.8 F (36.6 C), temperature source Oral, resp. rate 20, height 5\' 2"  (1.575 m), weight 75 kg, SpO2 100 %. Physical Exam  Nursing note and vitals reviewed. Constitutional: She is oriented to person, place, and time. She appears well-developed and well-nourished.  HENT:  Head: Normocephalic and atraumatic.  Eyes: Pupils are equal, round, and reactive to light. Conjunctivae and EOM are normal.  Neck: Normal range of motion. Neck supple.  Cardiovascular: Normal rate, regular rhythm and normal heart sounds.  Respiratory: Effort normal and breath sounds normal.  GI: Soft.  Musculoskeletal: Normal range of motion.        General: Edema present.  Neurological: She is alert and oriented to person, place, and time. She has normal reflexes.  Skin: Skin is warm and dry.  Psychiatric: She has a normal mood and affect.    Assessment/Plan: Hypertensive urgency Borderline troponins Vertigo Chronic paroxysmal atrial fibrillation History of bleeding nasally and rectally on anticoagulation now off Hypoxemia COPD Coronary artery  disease Hyperlipidemia Diabetes . Plan Agree with admit Rule out myocardial infarction follow-up EKGs and troponin Continue Lipitor therapy for lipid management Agree with diabetes management control Inhalers for COPD supplemental oxygen will be helpful Continue rate control for A. fib with metoprolol avoid anticoagulation because of GI bleeding Consider meclizine for vertigo and dizziness Aggressive blood pressure management control Echocardiogram will be helpful Do not recommend cardiac cath   Dwayne D Callwood 11/22/2018, 11:55 PM

## 2018-11-22 NOTE — ED Notes (Addendum)
Pt's arm is swollen and has blistering noted to right forearm. CT called and informed of assessment. Per Ria Comment in CT that is a normal sign of what happened earlier with the IV contrast. She also stated that they would call and keep a close check on pt during stay at hospital. MD [facility] informed and stated he would take a look at pt's arm.

## 2018-11-22 NOTE — ED Notes (Addendum)
Pt states she feels little short of breath. Pt states she wears 2L of O2 around this time when at home. O2 applied to pt via nasal cannula. Pt laid back for lying orthostatic BP. Pt sat back up and stated she felt dizzy and sick on her stomach. Pt also c/o headache at this time. Pt given cold cloth for head. Pt denies any chest pain. Will inform MD.

## 2018-11-22 NOTE — ED Notes (Signed)
Dr Alfred Levins informed of blood pressure and order to hold nitroglycerin drip at this time.

## 2018-11-22 NOTE — ED Notes (Signed)
Patient transported to CT 

## 2018-11-22 NOTE — ED Notes (Signed)
Called Pharmacy and spoke with Corene Cornea and requested pt's medications to be sent up. Per Corene Cornea he will send them as soon as he can.

## 2018-11-22 NOTE — ED Notes (Signed)
Attempted to call report, per Remo Lipps RN will be with me shortly. Placed on hold, had to hang up due to pt calling out. Called back and Tammy RN trying to read through chart at this time and will call back when she is ready. RN Charge aware of hold at this time.

## 2018-11-22 NOTE — ED Notes (Signed)
Spoke with pt's daughter Cherre Robins and updated her on pt's status and plan of care. Per daughter please let password be "Sharyn Lull" so no one will be confused. Daughter #[phone]

## 2018-11-22 NOTE — ED Notes (Signed)
Per pt request external cath removed.

## 2018-11-22 NOTE — ED Triage Notes (Signed)
EMS pt to Rm 19 from home with report of 4 days of having HTN at night with dizziness and nausea. Hx of CVA, COPD, HTN and DM.

## 2018-11-22 NOTE — H&P (Addendum)
[facility] NAME: Peggy [name]    MR#:  [id number]  DATE OF BIRTH:  May 14, 1929  DATE OF ADMISSION:  11/22/2018  PRIMARY CARE PHYSICIAN: Kirk Ruths, MD   REQUESTING/REFERRING PHYSICIAN: Rudene Re, MD  CHIEF COMPLAINT:   Chief Complaint  Patient presents with  . Dizziness  . Hypertension    HISTORY OF PRESENT ILLNESS:  85 y.o. female with pertinent past medical history of chronic respiratory failure with hypoxia on nocturnal oxygen, GI bleed, COPD, CVA, atrial fibrillation currently not on anticoagulation due to GI bleed, bilateral carotid artery disease, hypothyroidism, hypertension, hyperlipidemia, DM type II, CKD stage III, and MI presenting to the ED with chief complaints of dizziness and unsteadiness since Thursday.  She reports onset of symptoms since 05/16/2017 and has been progressive with worsening symptoms last night.  Patient states dizziness occur mostly at night without associated altered sensorium, speech abnormality, cranial nerve deficit, seizures, focal motor or sensory deficits, diplopia, syncope or LOC, paresthesia (numbness, tingling, pins-and-needles sensation) or a heavy feeling in an extremity. She however endorses feeling of lightheadedness and dizziness.  Also reports episodes of nausea without vomiting and headache.  On arrival to the ED, She was afebrile with elevated blood pressure 235/63 mm Hg and pulse rate 66 beats/min. There were no focal neurological deficits; she was alert and oriented x4, and she did not demonstrate any memory deficits.  Initial labs revealed microcytic anemia otherwise unremarkable CBC, glucose 269 otherwise unremarkable CMP, elevated troponin 0 0.54.  A noncontrast CT head was obtained and showed no acute intracranial abnormality.  Patient was started on nitro drip for elevated blood pressure which was subsequently stopped and started on PRN hydralazine.  Patient  complains of right upper quadrant pain which she describes as colicky without associated symptoms.  Patient will be admitted to hospitalist service for further work-up and management.  PAST MEDICAL HISTORY:   Past Medical History:  Diagnosis Date  . A-fib ([place])   . Allergy   . Arthritis   . Diabetes mellitus without complication ([place])   . GERD (gastroesophageal reflux disease)   . Hyperlipidemia   . Hypertension   . TIA (transient ischemic attack)     PAST SURGICAL HISTORY:   Past Surgical History:  Procedure Laterality Date  . ABDOMINAL HYSTERECTOMY  1970  . BACK SURGERY  2013  . colectomy Right 10/08/2013   Dr. Marina Gravel  . CORONARY ANGIOPLASTY WITH STENT PLACEMENT      SOCIAL HISTORY:   Social History   Tobacco Use  . Smoking status: Former Smoker    Packs/day: 1.00    Years: 30.00    Pack years: 30.00    Quit date: 11/30/1999    Years since quitting: 18.9  . Smokeless tobacco: Never Used  Substance Use Topics  . Alcohol use: No    FAMILY HISTORY:   Family History  Problem Relation Age of Onset  . Breast cancer Mother   . Pancreatitis Father   . Breast cancer Sister   . Lung cancer Brother   . Melanoma Brother   . Throat cancer Brother     DRUG ALLERGIES:   Allergies  Allergen Reactions  . Diphenhydramine Rash    AGITATION/DELIRIUM  . Gabapentin Itching    And rash  . Metformin Diarrhea  . Oxybutynin Other (See Comments)    dizzy  . Amlodipine Rash  . Ciprofloxacin Rash  . Lisinopril Rash  . Penicillins Rash  Family states was a "long time ago"  . Saxagliptin Rash    REVIEW OF SYSTEMS:   Review of Systems  Constitutional: Negative for chills, fever, malaise/fatigue and weight loss.  HENT: Positive for hearing loss. Negative for congestion and sore throat.   Eyes: Negative for blurred vision and double vision.  Respiratory: Positive for shortness of breath. Negative for cough and wheezing.   Cardiovascular: Negative for chest pain,  palpitations, orthopnea and leg swelling.  Gastrointestinal: Positive for abdominal pain and nausea. Negative for diarrhea and vomiting.  Genitourinary: Negative for dysuria and urgency.  Musculoskeletal: Negative for myalgias.  Skin: Negative for rash.  Neurological: Positive for dizziness, weakness and headaches. Negative for sensory change, speech change and focal weakness.  Psychiatric/Behavioral: Negative for depression.   MEDICATIONS AT HOME:   Prior to Admission medications   Medication Sig Start Date End Date Taking? Authorizing Provider  Cholecalciferol (VITAMIN D3) 5000 units TABS Take 5,000 Units by mouth daily.   Yes [provider]  gabapentin (NEURONTIN) 100 MG capsule Take 100 mg by mouth at bedtime.   Yes [provider]  HUMULIN 70/30 KWIKPEN (70-30) 100 UNIT/ML PEN Inject 20 Units into the skin 2 (two) times daily. Patient taking differently: Inject 25 Units into the skin 2 (two) times daily.  09/26/17  Yes Enedina FinnerPatel, Sona, MD  HYDROcodone-acetaminophen (NORCO/VICODIN) 5-325 MG tablet Take 1 tablet by mouth 2 (two) times daily. As needed for chronic low back pain 07/18/15  Yes Lorie PhenixMaloney, Nancy, MD  hydrOXYzine (ATARAX/VISTARIL) 25 MG tablet Take 25 mg by mouth 4 (four) times daily as needed for itching. 06/05/17  Yes [provider]  lactobacillus acidophilus (BACID) TABS tablet Take 2 tablets by mouth daily.   Yes [provider]  levothyroxine (SYNTHROID, LEVOTHROID) 50 MCG tablet Take 1 tablet (50 mcg total) by mouth daily. PATIENT NEEDS TO SCHEDULE OFFICE VISIT FOR FOLLOW UP 02/01/16  Yes Malva LimesFisher, Donald E, MD  metoprolol succinate (TOPROL-XL) 50 MG 24 hr tablet Take 50 mg by mouth daily. Take with or immediately following a meal.   Yes [provider]  pantoprazole (PROTONIX) 40 MG tablet TAKE 1 TABLET(40 MG) BY MOUTH DAILY 07/22/18  Yes Johnna AcostaScarboro, Adam J, NP  Potassium 99 MG TABS Take 1 tablet by mouth daily.   Yes [provider]   vitamin B-12 (CYANOCOBALAMIN) 1000 MCG tablet Take 1,000 mcg by mouth daily.   Yes [provider]  amiodarone (PACERONE) 400 MG tablet Take 0.5 tablets (200 mg total) by mouth daily. Patient not taking: Reported on 11/22/2018 09/26/17   Enedina FinnerPatel, Sona, MD  apixaban (ELIQUIS) 5 MG TABS tablet Take 1 tablet (5 mg total) by mouth 2 (two) times daily. Patient not taking: Reported on 11/22/2018 09/26/17   Enedina FinnerPatel, Sona, MD  atorvastatin (LIPITOR) 40 MG tablet Take 1 tablet (40 mg total) by mouth daily at 6 PM. Patient not taking: Reported on 11/22/2018 12/10/16   Alford HighlandWieting, Richard, MD  budesonide-formoterol [facility](SYMBICORT) 80-4.5 MCG/ACT inhaler Inhale 1 puff into the lungs 2 (two) times daily. Patient not taking: Reported on 11/22/2018 05/12/18   Johnna AcostaScarboro, Adam J, NP  chlorpheniramine-HYDROcodone [facility](TUSSIONEX PENNKINETIC ER) 10-8 MG/5ML SUER Take 5 mLs by mouth every 12 (twelve) hours as needed for cough. Patient not taking: Reported on 11/22/2018 07/22/18   Johnna AcostaScarboro, Adam J, NP  fenofibrate (TRICOR) 145 MG tablet Take 1 tablet (145 mg total) by mouth daily. Patient not taking: Reported on 11/22/2018 09/10/15   Lorie PhenixMaloney, Nancy, MD  fluticasone [facility](FLONASE) 50 MCG/ACT  nasal spray USE ONE SPRAY IN EACH NOSTRIL DAILY Patient not taking: Reported on 11/22/2018 09/27/15   Lorie Phenix, MD  guaiFENesin (MUCINEX) 600 MG 12 hr tablet Take by mouth 2 (two) times daily.    [provider]  guaiFENesin-codeine 100-10 MG/5ML syrup Take 10 mLs by mouth at bedtime. Patient not taking: Reported on 11/22/2018 09/26/17   Enedina Finner, MD  ipratropium-albuterol (DUONEB) 0.5-2.5 (3) MG/3ML SOLN Take 3 mLs by nebulization every 6 (six) hours as needed. Patient not taking: Reported on 11/22/2018 08/14/17   Auburn Bilberry, MD  OXYGEN Inhale 2 L into the lungs.    [provider]  predniSONE (DELTASONE) 20 MG tablet  07/06/18   [provider]  VENTOLIN HFA 108 (90 Base) MCG/ACT inhaler Inhale 2 puffs into the lungs  every 6 (six) hours as needed. 05/24/17   [provider]      VITAL SIGNS:  Blood pressure (!) 186/60, pulse 71, temperature (!) 97.5 F (36.4 C), temperature source Oral, resp. rate 15, height 5\' 2"  (1.575 m), weight 74.8 kg, SpO2 100 %.  PHYSICAL EXAMINATION:   Physical Exam  GENERAL:  85 y.o.-year-old patient lying in the bed with no acute distress.  EYES: Pupils equal, round, reactive to light and accommodation. No scleral icterus. Extraocular muscles intact.  HEENT: Head atraumatic, normocephalic. Oropharynx and nasopharynx clear.  NECK:  Supple, no jugular venous distention. No thyroid enlargement, no tenderness.  LUNGS: Normal breath sounds bilaterally, no wheezing, rales,rhonchi or crepitation. No use of accessory muscles of respiration.  CARDIOVASCULAR: S1, S2 normal. No murmurs, rubs, or gallops.  ABDOMEN: Soft, nontender, nondistended. Bowel sounds present. No organomegaly or mass.  EXTREMITIES: No pedal edema, cyanosis, or clubbing. No rash or lesions. + pedal pulses MUSCULOSKELETAL: Normal bulk, and power was 5+ grip and elbow, knee, and ankle flexion and extension bilaterally.  NEUROLOGIC:Alert and oriented x 3. CN 2-12 intact. Sensation to light touch and cold stimuli intact bilaterally. Finger to nose nl. Babinski is downgoing. DTR's (biceps, patellar, and achilles) 2+ and symmetric throughout. Gait not tested due to safety concern. PSYCHIATRIC: The patient is alert and oriented x 3.  SKIN: No obvious rash, lesion, or ulcer.   DATA REVIEWED:  LABORATORY PANEL:   CBC Recent Labs  Lab 11/22/18 0349  WBC 9.8  HGB 9.4*  HCT 33.1*  PLT 329   ------------------------------------------------------------------------------------------------------------------  Chemistries  Recent Labs  Lab 11/22/18 0349  NA 139  K 4.4  CL 103  CO2 26  GLUCOSE 269*  BUN 20  CREATININE 0.89  CALCIUM 9.1  AST 17  ALT 15  ALKPHOS 76  BILITOT 0.3    ------------------------------------------------------------------------------------------------------------------  Cardiac Enzymes Recent Labs  Lab 11/22/18 0349  TROPONINI 0.54*   ------------------------------------------------------------------------------------------------------------------  RADIOLOGY:  Ct Head Wo Contrast  Result Date: 11/22/2018 CLINICAL DATA:  Vertigo EXAM: CT HEAD WITHOUT CONTRAST TECHNIQUE: Contiguous axial images were obtained from the base of the skull through the vertex without intravenous contrast. COMPARISON:  12/09/2016 FINDINGS: Brain: There is no mass, hemorrhage or extra-axial collection. There is generalized atrophy without lobar predilection. Hypodensity of the white matter is most commonly associated with chronic microvascular disease. Enlarged perivascular space of the left lentiform nucleus, unchanged. Vascular: No abnormal hyperdensity of the major intracranial arteries or dural venous sinuses. No intracranial atherosclerosis. Skull: The visualized skull base, calvarium and extracranial soft tissues are normal. Sinuses/Orbits: No fluid levels or advanced mucosal thickening of the visualized paranasal sinuses. No mastoid or middle ear effusion. The orbits  are normal. IMPRESSION: Chronic small vessel disease and mild volume loss without acute intracranial abnormality. Electronically Signed   By: Deatra RobinsonKevin  Herman M.D.   On: 11/22/2018 04:13    EKG:  EKG: normal EKG, normal sinus rhythm, unchanged from previous tracings.  IMPRESSION AND PLAN:   85 y.o. female with pertinent past medical history of chronic respiratory failure with hypoxia on nocturnal oxygen, GI bleed, COPD, CVA, atrial fibrillation currently not on anticoagulation due to GI bleed, bilateral carotid artery disease, hypothyroidism, hypertension, hyperlipidemia, DM type II, CKD stage III, and MI presenting with dizziness and unsteadiness since Thursday.   1. Hypertensive crisis, - patient  presenting with severe elevation in blood pressure with no obvious associated target-organ dysfunction  - Admit to telemetry unit - CT head reviewed and negative for ICH, SAH or other acute intracranial abnormality - PRN hydralazine or IV labetalol - Will increase her home Metoprolol to 100 mg XR - Echocardiogram - CT chest - evaluate for dissection, PE, pulmonary edema  2. Dizziness - likely due to elevated Bp as above. Improved with BP treatment - MRI brain negative for stroke - CT head finding as above   3. Elevated troponin -likely due to demand ischemia from hypertensive crisis,  - Will continue to trend troponin - Cardiology consult, sees Dr. Darrold JunkerParaschos. message sent via Haiku to Dr. Juliann Paresallwood  4. Right Upper Quadrant pain - CT chest abd, chest and pelvis neg. - PRN pain managment  5. Chronic PAF - not on anticoagulation due to recent rectal bleed - Continue Metoprolol for rate control - Cardiology to see  6. COPD-  No signs of exacerbation - patient with hx of COPD uses home oxygen at night? - Supplemental O2, goal sat 88-92% - DuoNebs Q6hr - Continue home inhalers   7. CAD with MI s/p stent mid and distal right coronary artery (2009) - Not on antiplatelet due to GI bleed   8. HLD + Goal LDL<70 - Atorvastatin 40mg  PO qhs    9. DM - Insulin dependent at home, - Start SSI - Titrate up based on ongoing Sliding Scale needs  - Diabetes educator to follow    All the records are reviewed and case discussed with ED provider. Management plans discussed with the patient, family and they are in agreement.  CODE STATUS: FULL  TOTAL TIME TAKING CARE OF THIS PATIENT: 40 minutes.    on 11/22/2018 at 8:30 AM  This patient was staffed with Dr. Sarina SerVIpul, Sherryll BurgerShah who personally evaluated patient, reviewed documentation and agreed with assessment and plan of care as above.  Webb SilversmithElizabeth , DNP, FNP-BC [facility] Nurse Practitioner Between 7am to 6pm - Pager [phone]- [phone]   After 6pm go to [email or web address] - Social research officer, governmentpassword [email or web address]  [facility]  Office  [phone]  CC: Primary care physician; Lauro RegulusAnderson, Marshall W, MD

## 2018-11-22 NOTE — ED Notes (Signed)
Pt sitting up in bed with 2L nasal cannula O2 at this time. Pt states she feels better now and that she just needed the oxygen at that time. Pt states she still feels a little dizzy. Pt given call bell and instructions to call if she feels any pain.

## 2018-11-22 NOTE — ED Notes (Signed)
Pt given meal tray.

## 2018-11-23 ENCOUNTER — Inpatient Hospital Stay
Admit: 2018-11-23 | Discharge: 2018-11-23 | Disposition: A | Discharge status: Home / Self Care | Payer: Medicare Other | Attending: Nurse Practitioner | Admitting: Nurse Practitioner

## 2018-11-23 LAB — NOVEL CORONAVIRUS, NAA (HOSP ORDER, SEND-OUT TO REF LAB; TAT 18-24 HRS): SARS-CoV-2, NAA: NOT DETECTED

## 2018-11-23 LAB — GLUCOSE, CAPILLARY
Glucose-Capillary: 185 mg/dL — ABNORMAL HIGH (ref 70–99)
Glucose-Capillary: 216 mg/dL — ABNORMAL HIGH (ref 70–99)
Glucose-Capillary: 191 mg/dL — ABNORMAL HIGH (ref 70–99)
Glucose-Capillary: 229 mg/dL — ABNORMAL HIGH (ref 70–99)

## 2018-11-23 LAB — HEMOGLOBIN A1C
Hgb A1c MFr Bld: 8.1 % — ABNORMAL HIGH (ref 4.8–5.6)
Mean Plasma Glucose: 186 mg/dL

## 2018-11-23 LAB — TROPONIN I: Troponin I: 0.52 ng/mL (ref <0.03)

## 2018-11-23 MED ORDER — MOMETASONE FURO-FORMOTEROL FUM 100-5 MCG/ACT IN AERO
2.0000 | INHALATION_SPRAY | Freq: Two times a day (BID) | RESPIRATORY_TRACT | Status: DC
  Administered 2018-11-23 – 2018-11-24 (×2): 2 via RESPIRATORY_TRACT
  Filled 2018-11-23: qty 8.8

## 2018-11-23 MED ORDER — MECLIZINE HCL 12.5 MG PO TABS
12.5000 mg | ORAL_TABLET | Freq: Two times a day (BID) | ORAL | Status: DC
  Administered 2018-11-23 – 2018-11-24 (×3): 12.5 mg via ORAL
  Filled 2018-11-23 (×4): qty 1

## 2018-11-23 NOTE — Progress Notes (Signed)
Peggy [name] updated daughter on phone while RN and NP are at bedside. I will continue to assess.

## 2018-11-23 NOTE — Evaluation (Signed)
Physical Therapy Evaluation Patient Details Name: Peggy [name] MRN: [id number] DOB: 1928/11/15 Today's Date: 11/23/2018   History of Present Illness  85 y.o. female with pertinent past medical history of chronic respiratory failure with hypoxia on nocturnal oxygen, GI bleed, COPD, CVA, atrial fibrillation currently not on anticoagulation due to GI bleed, bilateral carotid artery disease, hypothyroidism, hypertension, hyperlipidemia, DM type II, CKD stage III, and MI presenting to the ED with chief complaints of dizziness and unsteadiness.    Clinical Impression  Pt alert, Ox4, behavior WFLs, pt eager to mobilize. Pt reported clear PLOF, stated at baseline she does not use an AD to ambulate, independent in ADLs and able to perform most IADLs independently as well.  Pt spO2 on 2L via [place] >95% throughout session. BP in sitting 150/73, pt with mild complaints of dizziness with mobility, stated overall it has greatly improved. Sit <> stand and ambulated ~123ft without AD, with supervision for safety. Pt denied increase in dizziness during session or during ambulation. Pt demonstrated mild deviations in gait, decreased gait velocity, but overall generally steady. The patient demonstrated near return to baseline level of functioning but could benefit from home health physical therapy to maximize pt strength/endurance, activity tolerance and mobility.     Follow Up Recommendations Home health PT    Equipment Recommendations  None recommended by PT    Recommendations for Other Services       Precautions / Restrictions Precautions Precautions: Fall Restrictions Weight Bearing Restrictions: No      Mobility  Bed Mobility Overal bed mobility: Modified Independent                Transfers Overall transfer level: Needs assistance   Transfers: Sit to/from Stand Sit to Stand: Supervision            Ambulation/Gait Ambulation/Gait assistance: Supervision;Min guard Gait Distance  (Feet): 180 Feet Assistive device: None     Gait velocity interpretation: 1.31 - 2.62 ft/sec, indicative of limited community ambulator General Gait Details: Decreased gait velocity, decreased stride length  Stairs            Wheelchair Mobility    Modified Rankin (Stroke Patients Only)       Balance Overall balance assessment: Needs assistance Sitting-balance support: Feet supported Sitting balance-Leahy Scale: Good       Standing balance-Leahy Scale: Fair                               Pertinent Vitals/Pain Pain Assessment: No/denies pain    Home Living Family/patient expects to be discharged to:: Private residence Living Arrangements: Children Available Help at Discharge: Available PRN/intermittently Type of Home: House Home Access: Stairs to enter Entrance Stairs-Rails: Left Entrance Stairs-Number of Steps: 3 Home Layout: One level Home Equipment: Walker - 4 wheels;Grab bars - tub/shower;Grab bars - toilet;Cane - single point      Prior Function Level of Independence: Independent with assistive device(s)         Comments: Does not use rollator at baseline but does have one, O2 at 2L via [place] at home for the last year, family assists with IADLs as needed but pt stated that she is able to drive, run errands, grocery shop     Hand Dominance   Dominant Hand: Right    Extremity/Trunk Assessment   Upper Extremity Assessment Upper Extremity Assessment: Overall WFL for tasks assessed    Lower Extremity Assessment Lower Extremity Assessment: Generalized weakness  Cervical / Trunk Assessment Cervical / Trunk Assessment: Kyphotic  Communication   Communication: HOH  Cognition Arousal/Alertness: Awake/alert Behavior During Therapy: WFL for tasks assessed/performed Overall Cognitive Status: Within Functional Limits for tasks assessed                                        General Comments      Exercises      Assessment/Plan    PT Assessment Patient needs continued PT services  PT Problem List Decreased strength;Decreased balance;Decreased activity tolerance       PT Treatment Interventions Therapeutic exercise;DME instruction;Gait training;Balance training;Stair training;Neuromuscular re-education;Functional mobility training;Therapeutic activities;Patient/family education    PT Goals (Current goals can be found in the Care Plan section)  Acute Rehab PT Goals Patient Stated Goal: to go home PT Goal Formulation: With patient Time For Goal Achievement: 12/07/18 Potential to Achieve Goals: Good    Frequency Min 2X/week   Barriers to discharge        Co-evaluation               AM-PAC PT "6 Clicks" Mobility  Outcome Measure Help needed turning from your back to your side while in a flat bed without using bedrails?: None Help needed moving from lying on your back to sitting on the side of a flat bed without using bedrails?: None Help needed moving to and from a bed to a chair (including a wheelchair)?: None Help needed standing up from a chair using your arms (e.g., wheelchair or bedside chair)?: A Little Help needed to walk in hospital room?: None Help needed climbing 3-5 steps with a railing? : A Little 6 Click Score: 22    End of Session Equipment Utilized During Treatment: Gait belt;Oxygen(2L) Activity Tolerance: Patient tolerated treatment well Patient left: in chair;with chair alarm set;with call bell/phone within reach Nurse Communication: Mobility status PT Visit Diagnosis: Other abnormalities of gait and mobility (R26.89);Difficulty in walking, not elsewhere classified (R26.2)    Time: 7619-5093 PT Time Calculation (min) (ACUTE ONLY): 26 min   Charges:   PT Evaluation $PT Eval Low Complexity: 1 Low PT Treatments $Therapeutic Exercise: 8-22 mins       Olga Coaster PT, DPT 5:06 PM,11/23/18 [phone]

## 2018-11-23 NOTE — Progress Notes (Addendum)
[facility] - [facility]   PATIENT NAME: Peggy [name]    MR#:  [id number]  DATE OF BIRTH:  1928/12/17  SUBJECTIVE:   Chief Complaint  Patient presents with   Dizziness   Hypertension   Patient is seen at the bedside. Overall she is feeling better. Denies abdominal pain or any discomfort. Blood pressure controlled. Still with intermittent dizziness with activity.  REVIEW OF SYSTEMS:  Review of Systems  Constitutional: Negative for chills, fever, malaise/fatigue and weight loss.  HENT: Negative for congestion, hearing loss and sore throat.   Eyes: Negative for blurred vision and double vision.  Respiratory: Positive for shortness of breath. Negative for cough and wheezing.   Cardiovascular: Negative for chest pain, palpitations, orthopnea and leg swelling.  Gastrointestinal: Negative for abdominal pain, diarrhea, nausea and vomiting.  Genitourinary: Negative for dysuria and urgency.  Musculoskeletal: Negative for myalgias.  Skin: Negative for rash.  Neurological: Positive for dizziness. Negative for sensory change, speech change, focal weakness and headaches.  Psychiatric/Behavioral: Negative for depression.   DRUG ALLERGIES:   Allergies  Allergen Reactions   Diphenhydramine Rash    AGITATION/DELIRIUM   Gabapentin Itching    And rash   Metformin Diarrhea   Oxybutynin Other (See Comments)    dizzy   Amlodipine Rash   Ciprofloxacin Rash   Lisinopril Rash   Penicillins Rash    Family states was a "long time ago"   Saxagliptin Rash   VITALS:  Blood pressure (!) 142/45, pulse 70, temperature (!) 97.5 F (36.4 C), temperature source Oral, resp. rate 20, height 5\' 2"  (1.575 m), weight 75 kg, SpO2 99 %. PHYSICAL EXAMINATION:   GENERAL:  85 y.o.-year-old patient lying in the bed with no acute distress.  EYES: Pupils equal, round, reactive to light and accommodation. No scleral icterus. Extraocular muscles intact.  HEENT: Head  atraumatic, normocephalic. Oropharynx and nasopharynx clear.  NECK:  Supple, no jugular venous distention. No thyroid enlargement, no tenderness.  LUNGS: Decreased breath sounds bilaterally, no wheezing, rales,rhonchi or crepitation. No use of accessory muscles of respiration.  CARDIOVASCULAR: S1, S2 normal. No murmurs, rubs, or gallops.  ABDOMEN: Soft, nontender, nondistended. Bowel sounds present. No organomegaly or mass.  EXTREMITIES: No pedal edema, cyanosis, or clubbing. No rash or lesions. + pedal pulses MUSCULOSKELETAL: Normal bulk, and power was 5+ grip and elbow, knee, and ankle flexion and extension bilaterally.  NEUROLOGIC:Alert and oriented x 3. CN 2-12 intact. Sensation to light touch and cold stimuli intact bilaterally. Finger to nose nl. Babinski is downgoing. DTR's (biceps, patellar, and achilles) 2+ and symmetric throughout. Gait not tested due to safety concern. PSYCHIATRIC: The patient is alert and oriented x 3.  SKIN: No obvious rash, lesion, or ulcer.   DATA REVIEWED:  LABORATORY PANEL:  Female CBC Recent Labs  Lab 11/22/18 0349  WBC 9.8  HGB 9.4*  HCT 33.1*  PLT 329   ------------------------------------------------------------------------------------------------------------------ Chemistries  Recent Labs  Lab 11/22/18 0349  NA 139  K 4.4  CL 103  CO2 26  GLUCOSE 269*  BUN 20  CREATININE 0.89  CALCIUM 9.1  AST 17  ALT 15  ALKPHOS 76  BILITOT 0.3   RADIOLOGY:  Ct Head Wo Contrast  Result Date: 11/22/2018 CLINICAL DATA:  Vertigo EXAM: CT HEAD WITHOUT CONTRAST TECHNIQUE: Contiguous axial images were obtained from the base of the skull through the vertex without intravenous contrast. COMPARISON:  12/09/2016 FINDINGS: Brain: There is no mass, hemorrhage or extra-axial collection. There is generalized  atrophy without lobar predilection. Hypodensity of the white matter is most commonly associated with chronic microvascular disease. Enlarged perivascular  space of the left lentiform nucleus, unchanged. Vascular: No abnormal hyperdensity of the major intracranial arteries or dural venous sinuses. No intracranial atherosclerosis. Skull: The visualized skull base, calvarium and extracranial soft tissues are normal. Sinuses/Orbits: No fluid levels or advanced mucosal thickening of the visualized paranasal sinuses. No mastoid or middle ear effusion. The orbits are normal. IMPRESSION: Chronic small vessel disease and mild volume loss without acute intracranial abnormality. Electronically Signed   By: Ulyses Jarred M.D.   On: 11/22/2018 04:13   Mr Brain Wo Contrast  Result Date: 11/22/2018 CLINICAL DATA:  Persistent central vertigo. Symptoms for 4 days. Dizziness and nausea. EXAM: MRI HEAD WITHOUT CONTRAST TECHNIQUE: Multiplanar, multiecho pulse sequences of the brain and surrounding structures were obtained without intravenous contrast. COMPARISON:  CT head 11/22/2018. MR head 12/09/2016. FINDINGS: Brain: No evidence for acute infarction, hemorrhage, mass lesion, hydrocephalus, or extra-axial fluid. Generalized atrophy, not unexpected for age. T2 and FLAIR hyperintensities throughout the white matter, mild to moderate chronic microvascular ischemic change. Chronic subcortical/periventricular RIGHT frontal infarct. Other smaller deep white matter infarcts centrum semiovale. Perivascular space, LEFT anterior commissure region. Incidental posterior fossa subdural hygromas, versus atrophy related. Vascular: Flow voids are maintained throughout the carotid, basilar, and vertebral arteries. There are no areas of chronic hemorrhage. Skull and upper cervical spine: Unremarkable visualized calvarium, skullbase, and cervical vertebrae. Pituitary, pineal, cerebellar tonsils unremarkable. No upper cervical cord lesions. Mild pannus. Sinuses/Orbits: No orbital masses or proptosis. Globes appear symmetric. Sinuses appear well aerated, without evidence for air-fluid level. Other: No  nasopharyngeal pathology or mastoid fluid. Scalp and other visualized extracranial soft tissues grossly unremarkable. Compared to prior MR, similar appearance. IMPRESSION: Atrophy and small vessel disease. No intracranial findings. No posterior fossa or retrocochlear pathology is identified Electronically Signed   By: Staci Righter M.D.   On: 11/22/2018 11:07   Ct Angio Chest/abd/pel For Dissection W And/or W/wo  Result Date: 11/22/2018 CLINICAL DATA:  Dizziness and hypertension. EXAM: CT ANGIOGRAPHY CHEST, ABDOMEN AND PELVIS TECHNIQUE: Multidetector CT imaging through the chest, abdomen and pelvis was performed using the standard protocol during bolus administration of intravenous contrast. Multiplanar reconstructed images and MIPs were obtained and reviewed to evaluate the vascular anatomy. CONTRAST:  162mL ISOVUE-370 IOPAMIDOL (ISOVUE-370) INJECTION 76% COMPARISON:  CT of the chest with contrast on 09/08/2017 FINDINGS: CTA CHEST FINDINGS Cardiovascular: There was some extravasation of contrast at the right arm injection site which limited opacification of the aorta. However, opacification is felt to be adequate to exclude dissection. The thoracic aorta is of normal caliber and demonstrates no evidence of acute dissection or intramural hemorrhage. Scattered calcified plaque is present. Proximal great vessels demonstrate normal patency. The heart size is normal. No pericardial fluid is identified. Extensive calcified plaque is noted of the coronary arteries in a 3 vessel distribution. Central pulmonary arteries are normal in caliber. Mediastinum/Nodes: Small nonenlarged mediastinal lymph nodes again noted which are smaller than on the prior study. No hilar or axillary lymphadenopathy. Small hiatal hernia present. Lungs/Pleura: Stable 8 mm well-circumscribed nodule in the superior segment of the left lower lobe. There is no evidence of pulmonary edema, consolidation, pneumothorax, nodule or pleural fluid.  Musculoskeletal: No chest wall abnormality. No acute or significant osseous findings. Review of the MIP images confirms the above findings. CTA ABDOMEN AND PELVIS FINDINGS VASCULAR Aorta: There is atherosclerosis of the abdominal aorta without evidence of aneurysm or  stenosis. Celiac: Calcified plaque at the origin of the celiac axis without significant stenosis identified. SMA: Calcified plaque at the SMA origin without significant stenosis identified. Renals: Bilateral single renal arteries demonstrate normal patency. IMA: Patent. Inflow: Calcified plaque in bilateral iliac arteries without significant stenosis or evidence of aneurysmal disease. Review of the MIP images confirms the above findings. NON-VASCULAR Hepatobiliary: Benign-appearing hepatic cyst in the left lobe measures approximately 1.8 x 2.3 cm. No gallstones, gallbladder wall thickening, or biliary dilatation. Pancreas: Unremarkable. No pancreatic ductal dilatation or surrounding inflammatory changes. Spleen: Normal in size without focal abnormality. Adrenals/Urinary Tract: Adrenal glands are unremarkable. Kidneys are normal, without renal calculi, focal lesion, or hydronephrosis. Bladder is unremarkable. Stomach/Bowel: Bowel shows no evidence of obstruction or ileus. There is evidence of prior right hemicolectomy. No free air. No focal masses identified. Lymphatic: No enlarged lymph nodes identified in the abdomen or pelvis. Reproductive: Status post hysterectomy. No adnexal masses. Other: No abdominal wall hernia or abnormality. No abdominopelvic ascites. Musculoskeletal: Mild loss of height of the L1 vertebral body. Degenerative disc disease at L4-5 and L5-S1. Review of the MIP images confirms the above findings. IMPRESSION: 1. No evidence of aortic dissection. Aortic atherosclerosis present without evidence of aortic aneurysm. 2. Coronary atherosclerosis with extensive calcified plaque in a 3 vessel distribution. 3. Stable 8 mm left lower lobe  pulmonary nodule. 4. Atherosclerosis of the abdominal aorta without evidence of aneurysm. 5. Mild loss of height of the L1 vertebral body. Electronically Signed   By: Irish Lack M.D.   On: 11/22/2018 10:52   ASSESSMENT AND PLAN:   85 y.o. female with pertinent past medical history of chronic respiratory failure with hypoxia on nocturnal oxygen, GI bleed, COPD, CVA, atrial fibrillation currently not on anticoagulation due to GI bleed, bilateral carotid artery disease, hypothyroidism, hypertension, hyperlipidemia, DM type II, CKD stage III, and MI presenting with dizziness and unsteadiness since Thursday.  1. Hypertensive crisis, - patient presenting with severe elevation in blood pressure with obvious associated target-organ dysfunction  - BP improving - CT head reviewed and negative for ICH, SAH or other acute intracranial abnormality - PRN hydralazine or IV labetalol - Metoprolol 100 mg XR - Echocardiogram pending - CT chest - negative for dissection, PE, pulmonary edema  2. Dizziness - likely due to elevated Bp as above. Improved with BP treatment - MRI brain negative for stroke - CT head finding as above  - Start meclizine  3. Elevated troponin -likely due to demand ischemia from hypertensive crisis,  - No associated chest pain - No cardiac intervention recommended by cardiology - Will continue to trend troponin - Cardiology input appreciated  4. Right Upper Quadrant pain - Resolved - CT chest abd, chest and pelvis neg. - PRN pain managment  5. Chronic PAF - not on anticoagulation due to recent rectal bleed - Continue Metoprolol for rate control - Cardiology input appreciated - Pending Echocardiogram  6. COPD- No signs of exacerbation- patient with hx of COPDuseshome oxygen at night? - Supplemental O2, goal sat 88-92% - DuoNebs Q6hr - Continue home inhalers  7.CAD with MI s/p stent mid and distal right coronary artery (2009) - Not on antiplatelet due to GI  bleed  8.HLD + Goal LDL<70 - Atorvastatin 40mg  PO qhs  9.DM -Insulindependent at home, - SSI - Titrate up based on ongoing Sliding Scale needs  - Diabetes educator to follow  All the records are reviewed and case discussed with Care Management/Social Worker. Management plans discussed with the patient,  family and they are in agreement.  CODE STATUS: Full Code  TOTAL TIME TAKING CARE OF THIS PATIENT: 38minutes.   More than 50% of the time was spent in counseling/coordination of care: YES  POSSIBLE D/C IN 1 DAYS, DEPENDING ON CLINICAL CONDITION.   on 11/23/2018 at 2:54 PM  This patient was staffed with Dr. Sherryll BurgerShah, Vipul who personally evaluated patient, reviewed documentation and agreed with assessment and plan of care as above.  Webb SilversmithElizabeth Stella Bortle, DNP, FNP-BC [facility] Nurse Practitioner   Between 7am to 6pm - Pager [phone]- [phone]  After 6pm go to [email or web address] - Scientist, research (life [email or web address])password [email or web address]  [facility]  Office  [phone]  CC: Primary care physician; Lauro RegulusAnderson, Marshall W, MD  Note: This dictation was prepared with Dragon dictation along with smaller phrase technology. Any transcriptional errors that result from this process are unintentional.

## 2018-11-23 NOTE — Progress Notes (Signed)
RN updated daughter ([email or web address]) on patient condition. I will continue to assess.

## 2018-11-23 NOTE — Progress Notes (Signed)
I called patient's nurse to ask about patient's right arm after infiltration yesterday. Patient had a blister pop and the nurse put bandage over it. Per nurse patient is not complaining of pain.

## 2018-11-24 LAB — CBC WITH DIFFERENTIAL/PLATELET
Abs Immature Granulocytes: 0.04 10*3/uL (ref 0.00–0.07)
Basophils Absolute: 0.1 10*3/uL (ref 0.0–0.1)
Basophils Relative: 1 %
Eosinophils Absolute: 0.6 10*3/uL — ABNORMAL HIGH (ref 0.0–0.5)
Eosinophils Relative: 7 %
HCT: 33 % — ABNORMAL LOW (ref 36.0–46.0)
Hemoglobin: 9.4 g/dL — ABNORMAL LOW (ref 12.0–15.0)
Immature Granulocytes: 1 %
Lymphocytes Relative: 19 %
Lymphs Abs: 1.7 10*3/uL (ref 0.7–4.0)
MCH: 20.9 pg — ABNORMAL LOW (ref 26.0–34.0)
MCHC: 28.5 g/dL — ABNORMAL LOW (ref 30.0–36.0)
MCV: 73.5 fL — ABNORMAL LOW (ref 80.0–100.0)
Monocytes Absolute: 0.6 10*3/uL (ref 0.1–1.0)
Monocytes Relative: 7 %
Neutro Abs: 5.7 10*3/uL (ref 1.7–7.7)
Neutrophils Relative %: 65 %
Platelets: 297 10*3/uL (ref 150–400)
RBC: 4.49 MIL/uL (ref 3.87–5.11)
RDW: 16.6 % — ABNORMAL HIGH (ref 11.5–15.5)
WBC: 8.8 10*3/uL (ref 4.0–10.5)
nRBC: 0 % (ref 0.0–0.2)

## 2018-11-24 LAB — BASIC METABOLIC PANEL
Anion gap: 10 (ref 5–15)
BUN: 25 mg/dL — ABNORMAL HIGH (ref 8–23)
CO2: 26 mmol/L (ref 22–32)
Calcium: 9 mg/dL (ref 8.9–10.3)
Chloride: 104 mmol/L (ref 98–111)
Creatinine, Ser: 0.86 mg/dL (ref 0.44–1.00)
GFR calc Af Amer: >60 mL/min (ref >60)
GFR calc non Af Amer: >60 mL/min (ref >60)
Glucose, Bld: 207 mg/dL — ABNORMAL HIGH (ref 70–99)
Potassium: 4.1 mmol/L (ref 3.5–5.1)
Sodium: 140 mmol/L (ref 135–145)

## 2018-11-24 LAB — GLUCOSE, CAPILLARY
Glucose-Capillary: 194 mg/dL — ABNORMAL HIGH (ref 70–99)
Glucose-Capillary: 300 mg/dL — ABNORMAL HIGH (ref 70–99)

## 2018-11-24 MED ORDER — MECLIZINE HCL 12.5 MG PO TABS: 12.5000 mg | ORAL_TABLET | Freq: Two times a day (BID) | ORAL | 0 refills | Status: DC

## 2018-11-24 MED ORDER — METOPROLOL SUCCINATE ER 100 MG PO TB24: 100.0000 mg | ORAL_TABLET | Freq: Every day | ORAL | 0 refills | Status: DC

## 2018-11-24 NOTE — Discharge Summary (Addendum)
[facility] - [place] [facility]   PATIENT NAME: Peggy [name]    MR#:  [id number]  DATE OF BIRTH:  [birth year]  DATE OF ADMISSION:  11/22/2018   ADMITTING PHYSICIAN: Delfino LovettVipul Shah, MD  DATE OF DISCHARGE: 11/24/2018  6:05 PM  PRIMARY CARE PHYSICIAN: Lauro RegulusAnderson, Marshall W, MD   ADMISSION DIAGNOSIS:   Dizziness [R42] Demand ischemia (HCC) [I24.8] Abdominal pain, RUQ [R10.11] Hypertensive emergency [I16.1]  DISCHARGE DIAGNOSIS:   Active Problems:   Uncontrolled hypertension   SECONDARY DIAGNOSIS:   Past Medical History:  Diagnosis Date  . A-fib (HCC)   . Allergy   . Arthritis   . Diabetes mellitus without complication (HCC)   . GERD (gastroesophageal reflux disease)   . Hyperlipidemia   . Hypertension   . TIA (transient ischemic attack)     HOSPITAL COURSE:   85 y.o. female with pertinent past medical history ofchronic respiratory failure with hypoxia on nocturnal oxygen, GI bleed, COPD, CVA, atrial fibrillation currently not on anticoagulation due to GI bleed, bilateral carotid artery disease, hypothyroidism, hypertension, hyperlipidemia, DM type II, CKD stage III, and MI presenting withdizziness and unsteadiness since Thursday.  1.Hypertensivecrisis,- patient presenting withsevere elevation in blood pressure withobviousassociated target-organ dysfunction  - BP improved at discharge - CT head reviewed and negative for ICH, SAH or other acute intracranial abnormality - Echocardiogram pending - CT chest - negative for dissection, PE, pulmonary edema - Continue Metoprolol 100 mg XR - Follow up with PCP for managment  2. Dizziness - likely due to elevated Bp as above. Improved with BP treatment - MRI brain negative for stroke - CT head finding as above - Continue meclizine PRN at discharge  3. Elevated troponin -likely due to demand ischemia from hypertensive crisis, - No associated chest pain - No cardiac intervention recommended by  cardiology  4. Right Upper Quadrant pain - Resolved - CT chest abd, chest and pelvis neg. - PRN pain managment  5. Chronic PAF - not on anticoagulation due to recent rectal bleed - Continue Metoprolol for rate control - Pending Echocardiogram - Follow up with cardiology on outpatient basis  6. COPD- No signs of exacerbation- patient with hx of COPDuseshome oxygen atnight? - Supplemental O2, goal sat 88-92% at home -Continue home inhalers - [facility] to follow with PT/OT/RN and respiratory support  7.CAD with MIs/pstent mid and distal right coronary artery (2009) - Not on antiplatelet due to GI bleed  8.HLD + Goal LDL<70 - Atorvastatin40mg  PO qhs  9.DM -Insulindependent at home, -continue home insulin  DISCHARGE CONDITIONS:   stable CONSULTS OBTAINED:   Treatment Team:  Alwyn Peaallwood, Dwayne D, MD  DRUG ALLERGIES:   Allergies  Allergen Reactions  . Diphenhydramine Rash    AGITATION/DELIRIUM  . Gabapentin Itching    And rash  . Metformin Diarrhea  . Oxybutynin Other (See Comments)    dizzy  . Amlodipine Rash  . Ciprofloxacin Rash  . Lisinopril Rash  . Penicillins Rash    Family states was a "long time ago"  . Saxagliptin Rash   DISCHARGE MEDICATIONS:   Allergies as of 11/24/2018      Reactions   Diphenhydramine Rash   AGITATION/DELIRIUM   Gabapentin Itching   And rash   Metformin Diarrhea   Oxybutynin Other (See Comments)   dizzy   Amlodipine Rash   Ciprofloxacin Rash   Lisinopril Rash   Penicillins Rash   Family states was a "long time ago"   Saxagliptin Rash  Medication List    STOP taking these medications   amiodarone 400 MG tablet Commonly known as: PACERONE   apixaban 5 MG Tabs tablet Commonly known as: ELIQUIS   atorvastatin 40 MG tablet Commonly known as: LIPITOR   chlorpheniramine-HYDROcodone 10-8 MG/5ML Suer Commonly known as: Tussionex Pennkinetic ER   fenofibrate 145 MG tablet Commonly known  as: TRICOR   fluticasone 50 MCG/ACT nasal spray Commonly known as: FLONASE   guaiFENesin-codeine 100-10 MG/5ML syrup   ipratropium-albuterol 0.5-2.5 (3) MG/3ML Soln Commonly known as: DUONEB   Potassium 99 MG Tabs   predniSONE 20 MG tablet Commonly known as: DELTASONE     TAKE these medications   budesonide-formoterol 80-4.5 MCG/ACT inhaler Commonly known as: SYMBICORT Inhale 1 puff into the lungs 2 (two) times daily.   gabapentin 100 MG capsule Commonly known as: NEURONTIN Take 100 mg by mouth at bedtime.   guaiFENesin 600 MG 12 hr tablet Commonly known as: MUCINEX Take by mouth 2 (two) times daily.   HumuLIN 70/30 KwikPen (70-30) 100 UNIT/ML PEN Generic drug: Insulin Isophane & Regular Human Inject 20 Units into the skin 2 (two) times daily. What changed: how much to take   HYDROcodone-acetaminophen 5-325 MG tablet Commonly known as: NORCO/VICODIN Take 1 tablet by mouth 2 (two) times daily. As needed for chronic low back pain   hydrOXYzine 25 MG tablet Commonly known as: ATARAX/VISTARIL Take 25 mg by mouth 4 (four) times daily as needed for itching.   lactobacillus acidophilus Tabs tablet Take 2 tablets by mouth daily.   levothyroxine 50 MCG tablet Commonly known as: SYNTHROID Take 1 tablet (50 mcg total) by mouth daily. PATIENT NEEDS TO SCHEDULE OFFICE VISIT FOR FOLLOW UP   meclizine 12.5 MG tablet Commonly known as: ANTIVERT Take 1 tablet (12.5 mg total) by mouth 2 (two) times daily.   metoprolol succinate 100 MG 24 hr tablet Commonly known as: TOPROL-XL Take 1 tablet (100 mg total) by mouth daily. Take with or immediately following a meal. Start taking on: November 25, 2018 What changed:   medication strength  how much to take   OXYGEN Inhale 2 L into the lungs.   pantoprazole 40 MG tablet Commonly known as: PROTONIX TAKE 1 TABLET(40 MG) BY MOUTH DAILY   Ventolin HFA 108 (90 Base) MCG/ACT inhaler Generic drug: albuterol Inhale 2 puffs into the  lungs every 6 (six) hours as needed.   vitamin B-12 1000 MCG tablet Commonly known as: CYANOCOBALAMIN Take 1,000 mcg by mouth daily.   Vitamin D3 125 MCG (5000 UT) Tabs Take 5,000 Units by mouth daily.        DISCHARGE INSTRUCTIONS:    DIET:   Cardiac diet  ACTIVITY:   Activity as tolerated  OXYGEN:   Home Oxygen: Yes.    Oxygen Delivery: 2 liters/min via Patient connected to nasal cannula oxygen  DISCHARGE LOCATION:   Home    If you experience worsening of your admission symptoms, develop shortness of breath, life threatening emergency, suicidal or homicidal thoughts you must seek medical attention immediately by calling 911 or calling your MD immediately  if symptoms less severe.  You Must read complete instructions/literature along with all the possible adverse reactions/side effects for all the Medicines you take and that have been prescribed to you. Take any new Medicines after you have completely understood and accpet all the possible adverse reactions/side effects.   Please note  You were cared for by a hospitalist during your hospital stay. If you have any questions about  your discharge medications or the care you received while you were in the hospital after you are discharged, you can call the unit and asked to speak with the hospitalist on call if the hospitalist that took care of you is not available. Once you are discharged, your primary care physician will handle any further medical issues. Please note that NO REFILLS for any discharge medications will be authorized once you are discharged, as it is imperative that you return to your primary care physician (or establish a relationship with a primary care physician if you do not have one) for your aftercare needs so that they can reassess your need for medications and monitor your lab values.    On the day of Discharge:  VITAL SIGNS:   Blood pressure (!) 165/58, pulse 64, temperature 97.8 F (36.6 C),  temperature source Oral, resp. rate 20, height 5\' 2"  (1.575 m), weight 75 kg, SpO2 100 %.  PHYSICAL EXAMINATION:    GENERAL:  85 y.o.-year-old patient lying in the bed with no acute distress.  EYES: Pupils equal, round, reactive to light and accommodation. No scleral icterus. Extraocular muscles intact.  HEENT: Head atraumatic, normocephalic. Oropharynx and nasopharynx clear.  NECK:  Supple, no jugular venous distention. No thyroid enlargement, no tenderness.  LUNGS: Normal breath sounds bilaterally, no wheezing, rales,rhonchi or crepitation. No use of accessory muscles of respiration.  CARDIOVASCULAR: S1, S2 normal. No murmurs, rubs, or gallops.  ABDOMEN: Soft, non-tender, non-distended. Bowel sounds present. No organomegaly or mass.  EXTREMITIES: No pedal edema, cyanosis, or clubbing.  NEUROLOGIC: Cranial nerves II through XII are intact. Muscle strength 5/5 in all extremities. Sensation intact. Gait not checked.  PSYCHIATRIC: The patient is alert and oriented x 3.  SKIN: No obvious rash, lesion, or ulcer.   DATA REVIEW:   CBC Recent Labs  Lab 11/24/18 0816  WBC 8.8  HGB 9.4*  HCT 33.0*  PLT 297    Chemistries  Recent Labs  Lab 11/22/18 0349 11/24/18 0816  NA 139 140  K 4.4 4.1  CL 103 104  CO2 26 26  GLUCOSE 269* 207*  BUN 20 25*  CREATININE 0.89 0.86  CALCIUM 9.1 9.0  AST 17  --   ALT 15  --   ALKPHOS 76  --   BILITOT 0.3  --      Microbiology Results  Results for orders placed or performed during the hospital encounter of 11/22/18  Novel Coronavirus,NAA,(SEND-OUT TO REF LAB - TAT 24-48 hrs); Hosp Order     Status: None   Collection Time: 11/22/18  5:46 AM   Specimen: Nasopharyngeal Swab; Respiratory  Result Value Ref Range Status   SARS-CoV-2, NAA NOT DETECTED NOT DETECTED Final    Comment: (NOTE) This test was developed and its performance characteristics determined by [employer]. This test has not been FDA cleared or approved. This test has  been authorized by FDA under an Emergency Use Authorization (EUA). This test is only authorized for the duration of time the declaration that circumstances exist justifying the authorization of the emergency use of in vitro diagnostic tests for detection of SARS-CoV-2 virus and/or diagnosis of COVID-19 infection under section 564(b)(1) of the Act, 21 U.S.C. 563OVF-6(E)(3), unless the authorization is terminated or revoked sooner. When diagnostic testing is negative, the possibility of a false negative result should be considered in the context of a patient's recent exposures and the presence of clinical signs and symptoms consistent with COVID-19. An individual without symptoms of COVID-19 and who is  not shedding SARS-CoV-2 virus would expect to have a negative (not detected) result in this assay. Performed  At: [facility] [address] [place], [place] [id number] Jolene SchimkeNagendra Sanjai MD WU:[id number]:[phone]    Coronavirus Source NASOPHARYNGEAL  Final    Comment: Performed at [facility], [address] [place], [place] [address]    RADIOLOGY:  No results found.   Management plans discussed with the patient, family and they are in agreement.  CODE STATUS:     Code Status Orders  (From admission, onward)         Start     Ordered   11/22/18 0803  Full code  Continuous     11/22/18 0802        Code Status History    Date Active Date Inactive Code Status Order ID Comments User Context   09/24/2017 1404 09/26/2017 1622 Full Code [id number]  Auburn BilberryPatel, Shreyang, MD Inpatient   08/07/2017 1223 08/14/2017 1551 Full Code [id number]  Arnaldo Nataliamond, Michael S, MD Inpatient   07/01/2017 1247 07/04/2017 1442 Full Code [id number]  Milagros LollSudini, Srikar, MD Inpatient   06/30/2017 2100 07/01/2017 1247 Partial Code [id number]  Altamese DillingVachhani, Vaibhavkumar, MD Inpatient   12/09/2016 2128 12/10/2016 2112 DNR [id number]  Milagros LollSudini, Srikar, MD ED   Advance Care Planning Activity      TOTAL TIME TAKING CARE OF THIS  PATIENT: 38 minutes.     11/24/2018 at 7:22 PM  Webb SilversmithElizabeth Lian Pounds, DNP, FNP-BC [facility] Nurse Practitioner Between 7am to 6pm - Pager - [phone]  After 6pm go to [email or web address] - Social research officer, governmentpassword [email or web address]  [facility]  Office  [phone]  CC: Primary care physician; Lauro RegulusAnderson, Marshall W, MD   Note: This dictation was prepared with Dragon dictation along with smaller phrase technology. Any transcriptional errors that result from this process are unintentional.

## 2018-11-24 NOTE — TOC Transition Note (Signed)
Transition of Care [facility]) - CM/SW Discharge Note   Patient Details  Name: Peggy [name] MRN: [id number] Date of Birth: 1929/01/12  Transition of Care [facility]) CM/SW Contact:  Elza Rafter, RN Phone Number: 11/24/2018, 11:51 AM   Clinical Narrative:   Patient is discharging to home with daughter today.  She is current with PCP and obtains medications without difficulty at [facility] in [place].  She does not use a walker or cane at home.  She has oxygen at home.  Offered home health services.  She would like [facility].  Referral made to [facility] for RN, PT, OT, aide.  No further needs identified.      Final next level of care: Home w Home Health Services Barriers to Discharge: No Barriers Identified   Patient Goals and CMS Choice   CMS Medicare.gov Compare Post Acute Care list provided to:: Patient Choice offered to / list presented to : Patient  Discharge Placement                       Discharge Plan and Services   Discharge Planning Services: CM Consult Post Acute Care Choice: Home Health                    HH Arranged: RN, PT, OT, Nurse's Aide, Social Work [employer] Agency: [place] ([facility]) Date [place]: 11/24/18 Time [place]: [place] ([place]) Interventions     Readmission Risk Interventions Readmission Risk Prevention Plan 11/24/2018  PCP or Specialist Appt within 5-7 Days Complete  Home Care Screening Complete  Medication Review (RN CM) Complete  Some recent data might be hidden

## 2018-11-24 NOTE — Progress Notes (Signed)
Pt discharged home via private car at 1815. Daughter, Nira Conn, picked pt up. Pt was A&Ox4. VSS. AVS reviewed with pt and all questions were answered. Pt verbalized understanding of discharge instructions. Pt left with clothes, shoes, and some personal belongings. Pt wheeled downstairs by this RN.

## 2018-12-13 LAB — ECHOCARDIOGRAM COMPLETE
Height: 62 in
Weight: 2645.52 oz

## 2018-12-20 ENCOUNTER — Telehealth: Payer: Self-pay

## 2018-12-20 NOTE — Telephone Encounter (Signed)
Left message for patient to call back and schedule to discuss overnight oximetry test results and her oxygen with provider, this can be a telephone call visit. beth

## 2018-12-21 ENCOUNTER — Encounter: Payer: Self-pay | Admitting: Internal Medicine

## 2018-12-21 ENCOUNTER — Ambulatory Visit: Payer: Medicare Other | Admitting: Internal Medicine

## 2018-12-21 ENCOUNTER — Other Ambulatory Visit: Payer: Self-pay

## 2018-12-21 VITALS — BP 147/58 | HR 89 | Resp 16

## 2018-12-21 DIAGNOSIS — J988 Other specified respiratory disorders: Secondary | ICD-10-CM

## 2018-12-21 DIAGNOSIS — R05 Cough: Secondary | ICD-10-CM | POA: Diagnosis not present

## 2018-12-21 DIAGNOSIS — K219 Gastro-esophageal reflux disease without esophagitis: Secondary | ICD-10-CM

## 2018-12-21 DIAGNOSIS — R059 Cough, unspecified: Secondary | ICD-10-CM

## 2018-12-21 MED ORDER — GUAIFENESIN-CODEINE 100-10 MG/5ML PO SYRP: 5.0000 mL | ORAL_SOLUTION | Freq: Three times a day (TID) | ORAL | 0 refills | Status: DC | PRN

## 2018-12-21 MED ORDER — PANTOPRAZOLE SODIUM 40 MG PO TBEC: 40.0000 mg | DELAYED_RELEASE_TABLET | Freq: Every day | ORAL | 0 refills | Status: DC

## 2018-12-21 NOTE — Progress Notes (Signed)
[facility] [place], [place] [address]  Internal MEDICINE  Telephone Visit  Patient Name: Peggy [name]  [id number]  [id number]  Date of Service: 12/21/2018  I connected with the patient at 449 by telephone and verified the patients identity using two identifiers.   I discussed the limitations, risks, security and privacy concerns of performing an evaluation and management service by telephone and the availability of in person appointments. I also discussed with the patient that there may be a patient responsible charge related to the service.  The patient expressed understanding and agrees to proceed.    Chief Complaint  Patient presents with  . Shortness of Breath  . Cough    STARTED LAST WEEK   . COPD    FOLLOW UP OVERNIGHT OX    HPI  PT is seen via telephone. Pt reports cough that has lasted about a week.  She denies fever, but reports feeling "run down."  Do to her significant respiratory history, will likely treat patient for resp infection.      Current Medication: Outpatient Encounter Medications as of 12/21/2018  Medication Sig  . Cholecalciferol (VITAMIN D3) 5000 units TABS Take 5,000 Units by mouth daily.  Marland Kitchen gabapentin (NEURONTIN) 100 MG capsule Take 100 mg by mouth at bedtime.  Marland Kitchen guaiFENesin (MUCINEX) 600 MG 12 hr tablet Take by mouth 2 (two) times daily.  Marland Kitchen HUMULIN 70/30 KWIKPEN (70-30) 100 UNIT/ML PEN Inject 20 Units into the skin 2 (two) times daily. (Patient taking differently: Inject 25 Units into the skin 2 (two) times daily. )  . HYDROcodone-acetaminophen (NORCO/VICODIN) 5-325 MG tablet Take 1 tablet by mouth 2 (two) times daily. As needed for chronic low back pain  . hydrOXYzine (ATARAX/VISTARIL) 25 MG tablet Take 25 mg by mouth 4 (four) times daily as needed for itching.  . lactobacillus acidophilus (BACID) TABS tablet Take 2 tablets by mouth daily.  Marland Kitchen levothyroxine (SYNTHROID, LEVOTHROID) 50 MCG tablet Take 1 tablet (50 mcg total) by  mouth daily. PATIENT NEEDS TO SCHEDULE OFFICE VISIT FOR FOLLOW UP  . meclizine (ANTIVERT) 12.5 MG tablet Take 1 tablet (12.5 mg total) by mouth 2 (two) times daily.  . metoprolol succinate (TOPROL-XL) 100 MG 24 hr tablet Take 1 tablet (100 mg total) by mouth daily. Take with or immediately following a meal.  . OXYGEN Inhale 2 L into the lungs.  . pantoprazole (PROTONIX) 40 MG tablet TAKE 1 TABLET(40 MG) BY MOUTH DAILY  . VENTOLIN HFA 108 (90 Base) MCG/ACT inhaler Inhale 2 puffs into the lungs every 6 (six) hours as needed.  . vitamin B-12 (CYANOCOBALAMIN) 1000 MCG tablet Take 1,000 mcg by mouth daily.  . [DISCONTINUED] budesonide-formoterol (SYMBICORT) 80-4.5 MCG/ACT inhaler Inhale 1 puff into the lungs 2 (two) times daily. (Patient not taking: Reported on 11/22/2018)   No facility-administered encounter medications on file as of 12/21/2018.     Surgical History: Past Surgical History:  Procedure Laterality Date  . ABDOMINAL HYSTERECTOMY  1970  . BACK SURGERY  2013  . colectomy Right 10/08/2013   Dr. Marina Gravel  . CORONARY ANGIOPLASTY WITH STENT PLACEMENT      Medical History: Past Medical History:  Diagnosis Date  . A-fib ([place])   . Allergy   . Arthritis   . Diabetes mellitus without complication ([place])   . GERD (gastroesophageal reflux disease)   . Hyperlipidemia   . Hypertension   . TIA (transient ischemic attack)     Family History: Family History  Problem Relation  Age of Onset  . Breast cancer Mother   . Pancreatitis Father   . Breast cancer Sister   . Lung cancer Brother   . Melanoma Brother   . Throat cancer Brother     Social History   Socioeconomic History  . Marital status: Widowed    Spouse name: Not on file  . Number of children: 3  . Years of education: H/S  . Highest education level: Not on file  Occupational History  . Occupation: Retired  Engineer, productionocial Needs  . Financial resource strain: Not on file  . Food insecurity    Worry: Not on file    Inability: Not  on file  . Transportation needs    Medical: Not on file    Non-medical: Not on file  Tobacco Use  . Smoking status: Former Smoker    Packs/day: 1.00    Years: 30.00    Pack years: 30.00    Quit date: 11/30/1999    Years since quitting: 19.0  . Smokeless tobacco: Never Used  Substance and Sexual Activity  . Alcohol use: No  . Drug use: No  . Sexual activity: Not on file  Lifestyle  . Physical activity    Days per week: Not on file    Minutes per session: Not on file  . Stress: Not on file  Relationships  . Social Musicianconnections    Talks on phone: Not on file    Gets together: Not on file    Attends religious service: Not on file    Active member of club or organization: Not on file    Attends meetings of clubs or organizations: Not on file    Relationship status: Not on file  . Intimate partner violence    Fear of current or ex partner: Not on file    Emotionally abused: Not on file    Physically abused: Not on file    Forced sexual activity: Not on file  Other Topics Concern  . Not on file  Social History Narrative  . Not on file      Review of Systems  Constitutional: Negative for chills, fatigue and unexpected weight change.  HENT: Negative for congestion, rhinorrhea, sneezing and sore throat.   Eyes: Negative for photophobia, pain and redness.  Respiratory: Negative for cough, chest tightness and shortness of breath.   Cardiovascular: Negative for chest pain and palpitations.  Gastrointestinal: Negative for abdominal pain, constipation, diarrhea, nausea and vomiting.  Endocrine: Negative.   Genitourinary: Negative for dysuria and frequency.  Musculoskeletal: Negative for arthralgias, back pain, joint swelling and neck pain.  Skin: Negative for rash.  Allergic/Immunologic: Negative.   Neurological: Negative for tremors and numbness.  Hematological: Negative for adenopathy. Does not bruise/bleed easily.  Psychiatric/Behavioral: Negative for behavioral problems and  sleep disturbance. The patient is not nervous/anxious.     Vital Signs: BP (!) 147/58   Pulse 89   Resp 16   SpO2 100%    Observation/Objective:  Well appearing, NAD noted at this time.   Assessment/Plan: 1. Respiratory infection Advised patient to take entire course of antibiotics as prescribed with food. Pt should return to clinic in 7-10 days if symptoms fail to improve or new symptoms develop.  - azithromycin (ZITHROMAX) 250 MG tablet; TAKE AS DIRECTED  Dispense: 6 tablet; Refill: 0 - predniSONE (DELTASONE) 10 MG tablet; Use per dose pack  Dispense: 21 tablet; Refill: 0 CXR clear. 2. Cough Use Robitussin as directed.  Reviewed risks and possible side effects  associated with taking opiates, benzodiazepines and other CNS depressants. Combination of these could cause dizziness and drowsiness. Advised patient not to drive or operate machinery when taking these medications, as patient's and other's life can be at risk and will have consequences. Patient verbalized understanding in this matter. Dependence and abuse for these drugs will be monitored closely. A Controlled substance policy and procedure is on file which allows [facility] to order a urine drug screen test at any visit. Patient understands and agrees with the plan - guaiFENesin-codeine (ROBITUSSIN AC) 100-10 MG/5ML syrup; Take 5 mLs by mouth 3 (three) times daily as needed for cough.  Dispense: 70 mL; Refill: 0  3. Gastroesophageal reflux disease without esophagitis Continue protonix. - pantoprazole (PROTONIX) 40 MG tablet; Take 1 tablet (40 mg total) by mouth daily.  Dispense: 90 tablet; Refill: 0  General Counseling: Peggy [name] verbalizes understanding of the findings of today's phone visit and agrees with plan of treatment. I have discussed any further diagnostic evaluation that may be needed or ordered today. We also reviewed her medications today. she has been encouraged to call the office with any questions or  concerns that should arise related to todays visit.    No orders of the defined types were placed in this encounter.   No orders of the defined types were placed in this encounter.   Time spent: 12 Minutes    Blima Ledger [facility] Internal medicine

## 2018-12-23 ENCOUNTER — Telehealth: Payer: Self-pay

## 2018-12-23 MED ORDER — PREDNISONE 10 MG PO TABS: ORAL_TABLET | ORAL | 0 refills | Status: DC

## 2018-12-23 MED ORDER — AZITHROMYCIN 250 MG PO TABS: ORAL_TABLET | ORAL | 0 refills | Status: DC

## 2018-12-23 NOTE — Telephone Encounter (Signed)
Pt daughter advised chest xray normal due to her symptoms we send  [place] zpak [place]one [place] [facility] [place] she come back if her symptoms is not gone we can send her covid 100

## 2018-12-31 ENCOUNTER — Other Ambulatory Visit: Payer: Self-pay

## 2018-12-31 DIAGNOSIS — J449 Chronic obstructive pulmonary disease, unspecified: Secondary | ICD-10-CM

## 2018-12-31 MED ORDER — BUDESONIDE-FORMOTEROL FUMARATE 80-4.5 MCG/ACT IN AERO: 1.0000 | INHALATION_SPRAY | Freq: Two times a day (BID) | RESPIRATORY_TRACT | 3 refills | Status: DC

## 2019-01-04 ENCOUNTER — Encounter: Payer: Self-pay | Admitting: Adult Health

## 2019-03-01 ENCOUNTER — Telehealth: Payer: Self-pay

## 2019-03-01 DIAGNOSIS — M159 Polyosteoarthritis, unspecified: Secondary | ICD-10-CM | POA: Insufficient documentation

## 2019-03-01 NOTE — Telephone Encounter (Signed)
Pt daughter called that pt need part for nebulizer machine advised her need to been seen because that machine never order to here we need face to face visit she said she will call u back and make appt

## 2019-03-06 ENCOUNTER — Inpatient Hospital Stay
Admission: EM | Admit: 2019-03-06 | Discharge: 2019-03-08 | DRG: 069 | Disposition: A | Discharge status: Home Health | Payer: Medicare Other | Attending: Internal Medicine | Admitting: Internal Medicine

## 2019-03-06 ENCOUNTER — Other Ambulatory Visit: Payer: Self-pay

## 2019-03-06 ENCOUNTER — Observation Stay: Payer: Medicare Other

## 2019-03-06 ENCOUNTER — Emergency Department: Payer: Medicare Other

## 2019-03-06 DIAGNOSIS — M545 Low back pain: Secondary | ICD-10-CM | POA: Diagnosis present

## 2019-03-06 DIAGNOSIS — M1990 Unspecified osteoarthritis, unspecified site: Secondary | ICD-10-CM | POA: Diagnosis present

## 2019-03-06 DIAGNOSIS — R4702 Dysphasia: Secondary | ICD-10-CM | POA: Diagnosis present

## 2019-03-06 DIAGNOSIS — Z7951 Long term (current) use of inhaled steroids: Secondary | ICD-10-CM

## 2019-03-06 DIAGNOSIS — Z87891 Personal history of nicotine dependence: Secondary | ICD-10-CM

## 2019-03-06 DIAGNOSIS — Z20828 Contact with and (suspected) exposure to other viral communicable diseases: Secondary | ICD-10-CM | POA: Diagnosis present

## 2019-03-06 DIAGNOSIS — J9611 Chronic respiratory failure with hypoxia: Secondary | ICD-10-CM | POA: Diagnosis present

## 2019-03-06 DIAGNOSIS — A084 Viral intestinal infection, unspecified: Secondary | ICD-10-CM | POA: Diagnosis present

## 2019-03-06 DIAGNOSIS — Z90710 Acquired absence of both cervix and uterus: Secondary | ICD-10-CM

## 2019-03-06 DIAGNOSIS — R1110 Vomiting, unspecified: Secondary | ICD-10-CM

## 2019-03-06 DIAGNOSIS — Z79899 Other long term (current) drug therapy: Secondary | ICD-10-CM

## 2019-03-06 DIAGNOSIS — I252 Old myocardial infarction: Secondary | ICD-10-CM

## 2019-03-06 DIAGNOSIS — I480 Paroxysmal atrial fibrillation: Secondary | ICD-10-CM | POA: Diagnosis present

## 2019-03-06 DIAGNOSIS — I169 Hypertensive crisis, unspecified: Secondary | ICD-10-CM | POA: Diagnosis present

## 2019-03-06 DIAGNOSIS — Z803 Family history of malignant neoplasm of breast: Secondary | ICD-10-CM

## 2019-03-06 DIAGNOSIS — E785 Hyperlipidemia, unspecified: Secondary | ICD-10-CM | POA: Diagnosis present

## 2019-03-06 DIAGNOSIS — Z79890 Hormone replacement therapy (postmenopausal): Secondary | ICD-10-CM

## 2019-03-06 DIAGNOSIS — Z79891 Long term (current) use of opiate analgesic: Secondary | ICD-10-CM

## 2019-03-06 DIAGNOSIS — Z794 Long term (current) use of insulin: Secondary | ICD-10-CM

## 2019-03-06 DIAGNOSIS — R410 Disorientation, unspecified: Secondary | ICD-10-CM

## 2019-03-06 DIAGNOSIS — G8929 Other chronic pain: Secondary | ICD-10-CM | POA: Diagnosis present

## 2019-03-06 DIAGNOSIS — E119 Type 2 diabetes mellitus without complications: Secondary | ICD-10-CM | POA: Diagnosis present

## 2019-03-06 DIAGNOSIS — D72829 Elevated white blood cell count, unspecified: Secondary | ICD-10-CM

## 2019-03-06 DIAGNOSIS — E039 Hypothyroidism, unspecified: Secondary | ICD-10-CM | POA: Diagnosis present

## 2019-03-06 DIAGNOSIS — Z880 Allergy status to penicillin: Secondary | ICD-10-CM

## 2019-03-06 DIAGNOSIS — R4701 Aphasia: Secondary | ICD-10-CM | POA: Diagnosis present

## 2019-03-06 DIAGNOSIS — I10 Essential (primary) hypertension: Secondary | ICD-10-CM | POA: Diagnosis present

## 2019-03-06 DIAGNOSIS — Z801 Family history of malignant neoplasm of trachea, bronchus and lung: Secondary | ICD-10-CM

## 2019-03-06 DIAGNOSIS — J449 Chronic obstructive pulmonary disease, unspecified: Secondary | ICD-10-CM | POA: Diagnosis present

## 2019-03-06 DIAGNOSIS — G459 Transient cerebral ischemic attack, unspecified: Principal | ICD-10-CM

## 2019-03-06 DIAGNOSIS — K219 Gastro-esophageal reflux disease without esophagitis: Secondary | ICD-10-CM | POA: Diagnosis present

## 2019-03-06 DIAGNOSIS — Z9981 Dependence on supplemental oxygen: Secondary | ICD-10-CM

## 2019-03-06 DIAGNOSIS — Z808 Family history of malignant neoplasm of other organs or systems: Secondary | ICD-10-CM

## 2019-03-06 DIAGNOSIS — Z882 Allergy status to sulfonamides status: Secondary | ICD-10-CM

## 2019-03-06 DIAGNOSIS — Z7952 Long term (current) use of systemic steroids: Secondary | ICD-10-CM

## 2019-03-06 DIAGNOSIS — Z881 Allergy status to other antibiotic agents status: Secondary | ICD-10-CM

## 2019-03-06 DIAGNOSIS — D509 Iron deficiency anemia, unspecified: Secondary | ICD-10-CM | POA: Diagnosis present

## 2019-03-06 DIAGNOSIS — Z955 Presence of coronary angioplasty implant and graft: Secondary | ICD-10-CM

## 2019-03-06 DIAGNOSIS — Z8673 Personal history of transient ischemic attack (TIA), and cerebral infarction without residual deficits: Secondary | ICD-10-CM

## 2019-03-06 DIAGNOSIS — Z888 Allergy status to other drugs, medicaments and biological substances status: Secondary | ICD-10-CM

## 2019-03-06 HISTORY — DX: Cerebral infarction, unspecified: I63.9

## 2019-03-06 HISTORY — DX: Chronic obstructive pulmonary disease, unspecified: J44.9

## 2019-03-06 HISTORY — DX: Dependence on supplemental oxygen: Z99.81

## 2019-03-06 LAB — COMPREHENSIVE METABOLIC PANEL
ALT: 16 U/L (ref 0–44)
AST: 22 U/L (ref 15–41)
Albumin: 3.8 g/dL (ref 3.5–5.0)
Alkaline Phosphatase: 65 U/L (ref 38–126)
Anion gap: 11 (ref 5–15)
BUN: 16 mg/dL (ref 8–23)
CO2: 23 mmol/L (ref 22–32)
Calcium: 8.7 mg/dL — ABNORMAL LOW (ref 8.9–10.3)
Chloride: 100 mmol/L (ref 98–111)
Creatinine, Ser: 0.79 mg/dL (ref 0.44–1.00)
GFR calc Af Amer: >60 mL/min (ref >60)
GFR calc non Af Amer: >60 mL/min (ref >60)
Glucose, Bld: 149 mg/dL — ABNORMAL HIGH (ref 70–99)
Potassium: 4.1 mmol/L (ref 3.5–5.1)
Sodium: 134 mmol/L — ABNORMAL LOW (ref 135–145)
Total Bilirubin: 0.9 mg/dL (ref 0.3–1.2)
Total Protein: 6.6 g/dL (ref 6.5–8.1)

## 2019-03-06 LAB — URINALYSIS, COMPLETE (UACMP) WITH MICROSCOPIC
Bacteria, UA: NONE SEEN
Bilirubin Urine: NEGATIVE
Glucose, UA: 50 mg/dL — AB
Hgb urine dipstick: NEGATIVE
Ketones, ur: NEGATIVE mg/dL
Leukocytes,Ua: NEGATIVE
Nitrite: NEGATIVE
Protein, ur: NEGATIVE mg/dL
Specific Gravity, Urine: 1.01 (ref 1.005–1.030)
pH: 7 (ref 5.0–8.0)

## 2019-03-06 LAB — CBC
HCT: 35.3 % — ABNORMAL LOW (ref 36.0–46.0)
Hemoglobin: 10.1 g/dL — ABNORMAL LOW (ref 12.0–15.0)
MCH: 21.1 pg — ABNORMAL LOW (ref 26.0–34.0)
MCHC: 28.6 g/dL — ABNORMAL LOW (ref 30.0–36.0)
MCV: 73.7 fL — ABNORMAL LOW (ref 80.0–100.0)
Platelets: 323 10*3/uL (ref 150–400)
RBC: 4.79 MIL/uL (ref 3.87–5.11)
RDW: 16.4 % — ABNORMAL HIGH (ref 11.5–15.5)
WBC: 11 10*3/uL — ABNORMAL HIGH (ref 4.0–10.5)
nRBC: 0 % (ref 0.0–0.2)

## 2019-03-06 LAB — SARS CORONAVIRUS 2 BY RT PCR (HOSPITAL ORDER, PERFORMED IN ~~LOC~~ HOSPITAL LAB): SARS Coronavirus 2: NEGATIVE

## 2019-03-06 LAB — GLUCOSE, CAPILLARY
Glucose-Capillary: 130 mg/dL — ABNORMAL HIGH (ref 70–99)
Glucose-Capillary: 211 mg/dL — ABNORMAL HIGH (ref 70–99)

## 2019-03-06 LAB — PROTIME-INR
INR: 1.1 (ref 0.8–1.2)
Prothrombin Time: 14 seconds (ref 11.4–15.2)

## 2019-03-06 MED ORDER — ACETAMINOPHEN 325 MG PO TABS: 650.0000 mg | ORAL_TABLET | ORAL | Status: DC | PRN

## 2019-03-06 MED ORDER — SENNOSIDES-DOCUSATE SODIUM 8.6-50 MG PO TABS: 1.0000 | ORAL_TABLET | Freq: Every evening | ORAL | Status: DC | PRN

## 2019-03-06 MED ORDER — ACETAMINOPHEN 650 MG RE SUPP: 650.0000 mg | RECTAL | Status: DC | PRN

## 2019-03-06 MED ORDER — STROKE: EARLY STAGES OF RECOVERY BOOK
Freq: Once | Status: AC
  Administered 2019-03-06: 20:00:00

## 2019-03-06 MED ORDER — HYDROXYZINE HCL 25 MG PO TABS
25.0000 mg | ORAL_TABLET | Freq: Four times a day (QID) | ORAL | Status: DC | PRN
  Filled 2019-03-06: qty 1

## 2019-03-06 MED ORDER — LABETALOL HCL 5 MG/ML IV SOLN: 5.0000 mg | Freq: Four times a day (QID) | INTRAVENOUS | Status: DC | PRN

## 2019-03-06 MED ORDER — INSULIN GLARGINE 100 UNIT/ML ~~LOC~~ SOLN
20.0000 [IU] | Freq: Every day | SUBCUTANEOUS | Status: DC
  Administered 2019-03-06 – 2019-03-07 (×2): 20 [IU] via SUBCUTANEOUS
  Filled 2019-03-06 (×3): qty 0.2

## 2019-03-06 MED ORDER — GABAPENTIN 100 MG PO CAPS
100.0000 mg | ORAL_CAPSULE | Freq: Every day | ORAL | Status: DC
  Administered 2019-03-07: 20:00:00 100 mg via ORAL
  Filled 2019-03-06 (×2): qty 1

## 2019-03-06 MED ORDER — PANTOPRAZOLE SODIUM 40 MG PO TBEC
40.0000 mg | DELAYED_RELEASE_TABLET | Freq: Every day | ORAL | Status: DC
  Administered 2019-03-07: 09:00:00 40 mg via ORAL
  Filled 2019-03-06 (×2): qty 1

## 2019-03-06 MED ORDER — ASPIRIN EC 81 MG PO TBEC
81.0000 mg | DELAYED_RELEASE_TABLET | Freq: Every day | ORAL | Status: DC
  Administered 2019-03-07 – 2019-03-08 (×2): 81 mg via ORAL
  Filled 2019-03-06 (×2): qty 1

## 2019-03-06 MED ORDER — ONDANSETRON HCL 4 MG/2ML IJ SOLN
4.0000 mg | Freq: Four times a day (QID) | INTRAMUSCULAR | Status: DC | PRN
  Administered 2019-03-06: 21:00:00 4 mg via INTRAVENOUS

## 2019-03-06 MED ORDER — LEVOTHYROXINE SODIUM 50 MCG PO TABS
50.0000 ug | ORAL_TABLET | Freq: Every day | ORAL | Status: DC
  Administered 2019-03-07 – 2019-03-08 (×2): 50 ug via ORAL
  Filled 2019-03-06 (×2): qty 1

## 2019-03-06 MED ORDER — INSULIN ASPART 100 UNIT/ML ~~LOC~~ SOLN
0.0000 [IU] | Freq: Three times a day (TID) | SUBCUTANEOUS | Status: DC
  Administered 2019-03-07 (×2): 3 [IU] via SUBCUTANEOUS
  Administered 2019-03-07: 09:00:00 1 [IU] via SUBCUTANEOUS
  Administered 2019-03-08: 3 [IU] via SUBCUTANEOUS
  Administered 2019-03-08: 09:00:00 1 [IU] via SUBCUTANEOUS
  Filled 2019-03-06 (×4): qty 1

## 2019-03-06 MED ORDER — MOMETASONE FURO-FORMOTEROL FUM 100-5 MCG/ACT IN AERO
2.0000 | INHALATION_SPRAY | Freq: Two times a day (BID) | RESPIRATORY_TRACT | Status: DC
  Administered 2019-03-07 – 2019-03-08 (×3): 2 via RESPIRATORY_TRACT
  Filled 2019-03-06: qty 8.8

## 2019-03-06 MED ORDER — METOPROLOL SUCCINATE ER 50 MG PO TB24
100.0000 mg | ORAL_TABLET | Freq: Every day | ORAL | Status: DC
  Administered 2019-03-07 – 2019-03-08 (×2): 100 mg via ORAL
  Filled 2019-03-06 (×3): qty 2

## 2019-03-06 MED ORDER — ONDANSETRON HCL 4 MG/2ML IJ SOLN
INTRAMUSCULAR | Status: AC
  Administered 2019-03-06: 21:00:00 4 mg via INTRAVENOUS
  Filled 2019-03-06: qty 2

## 2019-03-06 MED ORDER — ACETAMINOPHEN 160 MG/5ML PO SOLN
650.0000 mg | ORAL | Status: DC | PRN
  Filled 2019-03-06: qty 20.3

## 2019-03-06 MED ORDER — BACID PO TABS
2.0000 | ORAL_TABLET | Freq: Every day | ORAL | Status: DC
  Administered 2019-03-07: 2 via ORAL
  Filled 2019-03-06 (×2): qty 2

## 2019-03-06 MED ORDER — INSULIN ASPART 100 UNIT/ML ~~LOC~~ SOLN
0.0000 [IU] | Freq: Every day | SUBCUTANEOUS | Status: DC
  Administered 2019-03-07: 22:00:00 3 [IU] via SUBCUTANEOUS
  Filled 2019-03-06: qty 1

## 2019-03-06 MED ORDER — OXYCODONE HCL 5 MG PO TABS: 5.0000 mg | ORAL_TABLET | Freq: Four times a day (QID) | ORAL | Status: DC | PRN

## 2019-03-06 MED ORDER — PROCHLORPERAZINE EDISYLATE 10 MG/2ML IJ SOLN
5.0000 mg | INTRAMUSCULAR | Status: DC | PRN
  Administered 2019-03-06: 5 mg via INTRAVENOUS
  Filled 2019-03-06 (×2): qty 1

## 2019-03-06 MED ORDER — IPRATROPIUM-ALBUTEROL 0.5-2.5 (3) MG/3ML IN SOLN: 3.0000 mL | Freq: Four times a day (QID) | RESPIRATORY_TRACT | Status: DC | PRN

## 2019-03-06 MED ORDER — CALCIUM GLUCONATE-NACL 1-0.675 GM/50ML-% IV SOLN
1.0000 g | Freq: Once | INTRAVENOUS | Status: AC
  Administered 2019-03-06: 1000 mg via INTRAVENOUS
  Filled 2019-03-06: qty 50

## 2019-03-06 MED ORDER — HYDROCODONE-ACETAMINOPHEN 5-325 MG PO TABS
1.0000 | ORAL_TABLET | Freq: Two times a day (BID) | ORAL | Status: DC
  Administered 2019-03-06 – 2019-03-07 (×2): 1 via ORAL
  Filled 2019-03-06 (×2): qty 1

## 2019-03-06 MED ORDER — ATORVASTATIN CALCIUM 20 MG PO TABS
40.0000 mg | ORAL_TABLET | Freq: Every day | ORAL | Status: DC
  Administered 2019-03-07: 40 mg via ORAL
  Filled 2019-03-06: qty 2

## 2019-03-06 MED ORDER — ENOXAPARIN SODIUM 40 MG/0.4ML ~~LOC~~ SOLN
40.0000 mg | SUBCUTANEOUS | Status: DC
  Administered 2019-03-06 – 2019-03-07 (×2): 40 mg via SUBCUTANEOUS
  Filled 2019-03-06 (×2): qty 0.4

## 2019-03-06 NOTE — H&P (Signed)
[facility] NAME: Peggy [name]    MR#:  [id number]  DATE OF BIRTH:  [birth year]  DATE OF ADMISSION:  03/06/2019  PRIMARY CARE PHYSICIAN: Kirk Ruths, MD   REQUESTING/REFERRING PHYSICIAN: Tillman Sers, MD  CHIEF COMPLAINT:   Chief Complaint  Patient presents with  . Altered Mental Status    HISTORY OF PRESENT ILLNESS:  Peggy [name]  is a 85 y.o. female with a known history of atrial fibrillation, hypertension, hyperlipidemia, type 2 diabetes, history TIA who presented to the ED with difficulty speaking this morning.  Patient states she was confused and "knew what she wanted to say, but could not get the right words out".  This lasted for a couple of hours, and then patient went back to baseline, per her daughter.  Patient denies any vision changes.  She denies any weakness, numbness, or tingling of the extremities.  She does have a history of TIAs in the past.  No chest pain or abdominal pain, she does endorse some mild shortness of breath over the last several days. No cough.   In the ED, she was hypertensive with BP 207/78.  Labs were significant for WBC 11.0, hemoglobin 10.1, MCV 73.7.  UA was unremarkable.  Chest x-ray was negative.  CT head not show any acute findings.  COVID test was negative.  Hospitalists were called for admission.  PAST MEDICAL HISTORY:   Past Medical History:  Diagnosis Date  . A-fib ([place])   . Allergy   . Arthritis   . Diabetes mellitus without complication ([place])   . GERD (gastroesophageal reflux disease)   . Hyperlipidemia   . Hypertension   . TIA (transient ischemic attack)     PAST SURGICAL HISTORY:   Past Surgical History:  Procedure Laterality Date  . ABDOMINAL HYSTERECTOMY  1970  . BACK SURGERY  2013  . colectomy Right 10/08/2013   Dr. Marina Gravel  . CORONARY ANGIOPLASTY WITH STENT PLACEMENT      SOCIAL HISTORY:   Social History   Tobacco Use  . Smoking status: Former Smoker   Packs/day: 1.00    Years: 30.00    Pack years: 30.00    Quit date: 11/30/1999    Years since quitting: 19.2  . Smokeless tobacco: Never Used  Substance Use Topics  . Alcohol use: No    FAMILY HISTORY:   Family History  Problem Relation Age of Onset  . Breast cancer Mother   . Pancreatitis Father   . Breast cancer Sister   . Lung cancer Brother   . Melanoma Brother   . Throat cancer Brother     DRUG ALLERGIES:   Allergies  Allergen Reactions  . Diphenhydramine Rash    AGITATION/DELIRIUM  . Gabapentin Itching    And rash  . Metformin Diarrhea  . Oxybutynin Other (See Comments)    dizzy  . Amlodipine Rash  . Ciprofloxacin Rash  . Lisinopril Rash  . Penicillins Rash    Family states was a "long time ago"  . Saxagliptin Rash    REVIEW OF SYSTEMS:   Review of Systems  Constitutional: Negative for chills and fever.  HENT: Negative for congestion and sore throat.   Eyes: Negative for blurred vision and double vision.  Respiratory: Positive for shortness of breath. Negative for cough and sputum production.   Cardiovascular: Negative for chest pain and palpitations.  Gastrointestinal: Negative for nausea and vomiting.  Genitourinary: Negative for dysuria and urgency.  Musculoskeletal:  Negative for back pain and neck pain.  Neurological: Positive for speech change. Negative for dizziness, tingling, sensory change and headaches.  Psychiatric/Behavioral: Negative for depression. The patient is not nervous/anxious.     MEDICATIONS AT HOME:   Prior to Admission medications   Medication Sig Start Date End Date Taking? Authorizing Provider  azithromycin (ZITHROMAX) 250 MG tablet TAKE AS DIRECTED 12/23/18   [name], NP  budesonide-formoterol (SYMBICORT) 80-4.5 MCG/ACT inhaler Inhale 1 puff into the lungs 2 (two) times daily. 12/31/18   [name], NP  Cholecalciferol (VITAMIN D3) 5000 units TABS Take 5,000 Units by mouth daily.    [provider]   gabapentin (NEURONTIN) 100 MG capsule Take 100 mg by mouth at bedtime.    [provider]  guaiFENesin (MUCINEX) 600 MG 12 hr tablet Take by mouth 2 (two) times daily.    [provider]  guaiFENesin-codeine (ROBITUSSIN AC) 100-10 MG/5ML syrup Take 5 mLs by mouth 3 (three) times daily as needed for cough. 12/21/18   Scarboro, Coralee North, NP  HUMULIN 70/30 KWIKPEN (70-30) 100 UNIT/ML PEN Inject 20 Units into the skin 2 (two) times daily. Patient taking differently: Inject 25 Units into the skin 2 (two) times daily.  09/26/17   Enedina Finner, MD  HYDROcodone-acetaminophen (NORCO/VICODIN) 5-325 MG tablet Take 1 tablet by mouth 2 (two) times daily. As needed for chronic low back pain 07/18/15   Lorie Phenix, MD  hydrOXYzine (ATARAX/VISTARIL) 25 MG tablet Take 25 mg by mouth 4 (four) times daily as needed for itching. 06/05/17   [provider]  lactobacillus acidophilus (BACID) TABS tablet Take 2 tablets by mouth daily.    [provider]  levothyroxine (SYNTHROID, LEVOTHROID) 50 MCG tablet Take 1 tablet (50 mcg total) by mouth daily. PATIENT NEEDS TO SCHEDULE OFFICE VISIT FOR FOLLOW UP 02/01/16   Malva Limes, MD  meclizine (ANTIVERT) 12.5 MG tablet Take 1 tablet (12.5 mg total) by mouth 2 (two) times daily. 11/24/18   Jimmye Norman, NP  metoprolol succinate (TOPROL-XL) 100 MG 24 hr tablet Take 1 tablet (100 mg total) by mouth daily. Take with or immediately following a meal. 11/25/18   Jimmye Norman, NP  OXYGEN Inhale 2 L into the lungs.    [provider]  pantoprazole (PROTONIX) 40 MG tablet Take 1 tablet (40 mg total) by mouth daily. 12/21/18   [name], NP  predniSONE (DELTASONE) 10 MG tablet Use per dose pack 12/23/18   Scarboro, Coralee North, NP  VENTOLIN HFA 108 (90 Base) MCG/ACT inhaler Inhale 2 puffs into the lungs every 6 (six) hours as needed. 05/24/17   [provider]  vitamin B-12 (CYANOCOBALAMIN) 1000 MCG tablet Take 1,000  mcg by mouth daily.    [provider]      VITAL SIGNS:  Blood pressure (!) 207/78, pulse 75, temperature 98 F (36.7 C), temperature source Oral, resp. rate (!) 26, height 5\' 3"  (1.6 m), weight 77.6 kg, SpO2 100 %.  PHYSICAL EXAMINATION:  Physical Exam  GENERAL:  85 y.o.-year-old patient lying in the bed with no acute distress.  EYES: Pupils equal, round, reactive to light and accommodation. No scleral icterus. Extraocular muscles intact.  HEENT: Head atraumatic, normocephalic. Oropharynx and nasopharynx clear.  NECK:  Supple, no jugular venous distention. No thyroid enlargement, no tenderness.  LUNGS: Normal breath sounds bilaterally, no wheezing, rales,rhonchi or crepitation. No use of accessory muscles of respiration.  CARDIOVASCULAR: RRR, S1, S2 normal. No murmurs, rubs,  or gallops.  ABDOMEN: Soft, nontender, nondistended. Bowel sounds present. No organomegaly or mass.  EXTREMITIES: No pedal edema, cyanosis, or clubbing.  NEUROLOGIC: Cranial nerves II through XII are intact. +global weakness. Sensation intact. Gait not checked.  PSYCHIATRIC: The patient is alert and oriented x 3.  SKIN: No obvious rash, lesion, or ulcer.   LABORATORY PANEL:   CBC Recent Labs  Lab 03/06/19 1149  WBC 11.0*  HGB 10.1*  HCT 35.3*  PLT 323   ------------------------------------------------------------------------------------------------------------------  Chemistries  Recent Labs  Lab 03/06/19 1149  NA 134*  K 4.1  CL 100  CO2 23  GLUCOSE 149*  BUN 16  CREATININE 0.79  CALCIUM 8.7*  AST 22  ALT 16  ALKPHOS 65  BILITOT 0.9   ------------------------------------------------------------------------------------------------------------------  Cardiac Enzymes No results for input(s): TROPONINI in the last 168 hours. ------------------------------------------------------------------------------------------------------------------  RADIOLOGY:  Ct Head Wo Contrast   Result Date: 03/06/2019 CLINICAL DATA:  Encephalopathy.  Altered mental status. EXAM: CT HEAD WITHOUT CONTRAST TECHNIQUE: Contiguous axial images were obtained from the base of the skull through the vertex without intravenous contrast. COMPARISON:  11/22/2018 FINDINGS: Brain: Ventricles, cisterns and other CSF spaces are within normal. Prominent perivascular space left lentiform nucleus unchanged. No mass, mass effect, shift of midline structures or acute hemorrhage. No evidence of acute infarction. Minimal chronic ischemic microvascular disease. Vascular: No hyperdense vessel or unexpected calcification. Skull: Normal. Negative for fracture or focal lesion. Sinuses/Orbits: No acute finding. Other: None. IMPRESSION: No acute findings. Minimal chronic ischemic microvascular disease. Electronically Signed   By: Elberta Fortis M.D.   On: 03/06/2019 13:38   Dg Chest Portable 1 View  Result Date: 03/06/2019 CLINICAL DATA:  Cough. EXAM: PORTABLE CHEST 1 VIEW COMPARISON:  07/14/2018 and 01/26/2015 FINDINGS: Lungs are adequately inflated without lobar consolidation or effusion. Mild biapical pleural thickening. Subtle nodular density over the left mid to upper lung unchanged from 2016. Cardiomediastinal silhouette and remainder of the exam is unchanged. Remainder the exam is unchanged. IMPRESSION: No active disease. Electronically Signed   By: Elberta Fortis M.D.   On: 03/06/2019 13:35      IMPRESSION AND PLAN:   Expressive aphasia- likely TIA. Symptoms have resolved. -CT head was unremarkable -MRI brain ordered -Check lipid panel and A1c -Start aspirin 81mg  daily and lipitor -ECHO and carotid dopplers ordered -Neurology consult in the morning -PT/OT/SLP consults -Cardiac monitoring  Leukocytosis-mild.  Likely reactive.  No signs of infection. -Monitor  Paroxysmal atrial fibrillation- in normal sinus rhythm here -Continue home metoprolol -Not on any anticoagulation at home due to history of bleeding  on Xarelto  Chronic respiratory failure secondary to COPD on 2 L O2 at baseline -Continue home inhalers -Duonebs prn  Hypertension- BP elevated in the ED -Continue home metoprolol -IV labetalol prn  Type 2 diabetes- glucose mildly elevated in the ED -Continue Lantus -SSI  Hypothyroidism-stable -Continue home Synthroid  Microcytic anemia- hemoglobin is low, but at baseline. -Check anemia panel  All the records are reviewed and case discussed with ED provider. Management plans discussed with the patient, family and they are in agreement.  CODE STATUS: Full  TOTAL TIME TAKING CARE OF THIS PATIENT: 45 minutes.    Jinny Blossom Mayo M.D on 03/06/2019 at 2:45 PM  Between 7am to 6pm - Pager [phone]  After 6pm go to [email or web address] - Scientist, research (life sciences) [facility]  Office  [phone]  CC: Primary care physician; Lauro Regulus, MD   Note: This dictation was prepared  with Dragon dictation along with smaller phrase technology. Any transcriptional errors that result from this process are unintentional.

## 2019-03-06 NOTE — ED Notes (Signed)
Assisted pt up to use bathroom- pt requested to be put on oxygen- pt states she wears 2L Walker at home- pt placed on 2L  for comfort

## 2019-03-06 NOTE — Progress Notes (Signed)
Pt admitted from the ED.  VSS. NIH 2. A&Ox3.  HOH. Anxious. Denies pain.  Pt passed swallow screen. Daughter at the bedside.

## 2019-03-06 NOTE — ED Notes (Signed)
Pt up to use bathroom 

## 2019-03-06 NOTE — ED Triage Notes (Signed)
Pt arrives from home via EMS for AMS- daughter states that she was confused this morning and did not know who anyone was- pt has a hx of TIAs- pt is now A&O x4- pt is able to ambulate to bathroom with minimal assist- pt complaining of knee pain with a hx of arthritis

## 2019-03-06 NOTE — ED Notes (Signed)
Attempted to call report unsuccessful 

## 2019-03-06 NOTE — ED Provider Notes (Signed)
[facility] Emergency Department Provider Note  Time seen: 12:33 PM  I have reviewed the triage vital signs and the nursing notes.   HISTORY  Chief Complaint Altered Mental Status   HPI Peggy [name] is a 85 y.o. female with a past medical history of atrial fibrillation, arthritis, diabetes, gastric reflux, hypertension, hyperlipidemia, prior CVA/TIAs, presents to the emergency department for acute onset of confusion.  According to the daughter this morning patient seemed very confused, was calling the daughter by the wrong name, states normally the patient is oriented x4 without any issues.  This lasted for a couple hours however upon arrival to the emergency department patient is alert and oriented x4 and acting fairly normal per the daughter.  Denies any fever.  Daughter states she has been complaining of shortness of breath over the past several days.  Denies any cough.  Denies any dysuria nausea vomiting or diarrhea.  No chest or abdominal pain.   Past Medical History:  Diagnosis Date  . A-fib ([facility])   . Allergy   . Arthritis   . Diabetes mellitus without complication ([facility])   . GERD (gastroesophageal reflux disease)   . Hyperlipidemia   . Hypertension   . TIA (transient ischemic attack)     Patient Active Problem List   Diagnosis Date Noted  . Uncontrolled hypertension 11/22/2018  . Chronic respiratory failure with hypoxia ([facility]) 10/08/2017  . Simple chronic bronchitis ([facility]) 10/08/2017  . SOB (shortness of breath) 09/24/2017  . History of stroke 08/17/2017  . Acute respiratory failure with hypoxia and hypercarbia ([facility]) 08/07/2017  . Respiratory failure ([facility]) 08/07/2017  . History of GI bleed 07/07/2017  . Atrial fibrillation with RVR ([facility]) 06/30/2017  . Bilateral carotid artery disease ([facility]) 04/14/2017  . Healthcare maintenance 04/14/2017  . Dizziness 04/07/2017  . 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 TIA (transient ischemic  attack) 12/10/2016  . CVA (cerebral vascular accident) ([facility]) 12/09/2016  . Bilateral arm pain 11/27/2016  . Myocardial infarction ([facility]) 07/30/2015  . Urinary frequency 02/09/2015  . RUQ abdominal pain 01/26/2015  . Shortness of breath 01/26/2015  . Anxiety 01/26/2015  . Chronic pruritus 12/21/2014  . Cerebral vascular accident ([facility]) 11/16/2014  . Chronic low back pain 11/16/2014  . Adult hypothyroidism 11/15/2014  . Allergic rhinitis 11/15/2014  . Body mass index (BMI) of 28.0-28.9 in adult 11/15/2014  . Arthritis 11/15/2014  . Cramps of lower extremity 11/15/2014  . Essential (primary) hypertension 11/15/2014  . Acid reflux 11/15/2014  . Hypercholesteremia 11/15/2014  . Cannot sleep 11/15/2014  . Psoriasis 11/15/2014  . Itch of skin 11/15/2014  . Restless leg 11/15/2014  . Diabetes mellitus, type 2 ([facility]) 11/15/2014  . Breath shortness 11/15/2014  . Dermatitis due to unknown cause 04/10/2014  . Arteriosclerosis of coronary artery 02/20/2014  . AF (paroxysmal atrial fibrillation) ([facility]) 02/20/2014  . Temporary cerebral vascular dysfunction 02/20/2014  . DD (diverticular disease) 10/05/2013    Past Surgical History:  Procedure Laterality Date  . ABDOMINAL HYSTERECTOMY  1970  . BACK SURGERY  2013  . colectomy Right 10/08/2013   Dr. Egbert Garibaldi  . CORONARY ANGIOPLASTY WITH STENT PLACEMENT      Prior to Admission medications   Medication Sig Start Date End Date Taking? Authorizing Provider  azithromycin (ZITHROMAX) 250 MG tablet TAKE AS DIRECTED 12/23/18   [name], NP  budesonide-formoterol (SYMBICORT) 80-4.5 MCG/ACT inhaler Inhale 1 puff into the lungs 2 (two) times daily. 12/31/18   Scarboro,  Coralee North, NP  Cholecalciferol (VITAMIN D3) 5000 units TABS Take 5,000 Units by mouth daily.    [provider]  gabapentin (NEURONTIN) 100 MG capsule Take 100 mg by mouth at bedtime.    [provider]  guaiFENesin (MUCINEX) 600 MG 12 hr tablet Take by mouth 2 (two)  times daily.    [provider]  guaiFENesin-codeine (ROBITUSSIN AC) 100-10 MG/5ML syrup Take 5 mLs by mouth 3 (three) times daily as needed for cough. 12/21/18   Scarboro, Coralee North, NP  HUMULIN 70/30 KWIKPEN (70-30) 100 UNIT/ML PEN Inject 20 Units into the skin 2 (two) times daily. Patient taking differently: Inject 25 Units into the skin 2 (two) times daily.  09/26/17   Enedina Finner, MD  HYDROcodone-acetaminophen (NORCO/VICODIN) 5-325 MG tablet Take 1 tablet by mouth 2 (two) times daily. As needed for chronic low back pain 07/18/15   Lorie Phenix, MD  hydrOXYzine (ATARAX/VISTARIL) 25 MG tablet Take 25 mg by mouth 4 (four) times daily as needed for itching. 06/05/17   [provider]  lactobacillus acidophilus (BACID) TABS tablet Take 2 tablets by mouth daily.    [provider]  levothyroxine (SYNTHROID, LEVOTHROID) 50 MCG tablet Take 1 tablet (50 mcg total) by mouth daily. PATIENT NEEDS TO SCHEDULE OFFICE VISIT FOR FOLLOW UP 02/01/16   Malva Limes, MD  meclizine (ANTIVERT) 12.5 MG tablet Take 1 tablet (12.5 mg total) by mouth 2 (two) times daily. 11/24/18   Jimmye Norman, NP  metoprolol succinate (TOPROL-XL) 100 MG 24 hr tablet Take 1 tablet (100 mg total) by mouth daily. Take with or immediately following a meal. 11/25/18   Jimmye Norman, NP  OXYGEN Inhale 2 L into the lungs.    [provider]  pantoprazole (PROTONIX) 40 MG tablet Take 1 tablet (40 mg total) by mouth daily. 12/21/18   [name], NP  predniSONE (DELTASONE) 10 MG tablet Use per dose pack 12/23/18   Scarboro, Coralee North, NP  VENTOLIN HFA 108 (90 Base) MCG/ACT inhaler Inhale 2 puffs into the lungs every 6 (six) hours as needed. 05/24/17   [provider]  vitamin B-12 (CYANOCOBALAMIN) 1000 MCG tablet Take 1,000 mcg by mouth daily.    [provider]    Allergies  Allergen Reactions  . Diphenhydramine Rash    AGITATION/DELIRIUM  . Gabapentin Itching    And  rash  . Metformin Diarrhea  . Oxybutynin Other (See Comments)    dizzy  . Amlodipine Rash  . Ciprofloxacin Rash  . Lisinopril Rash  . Penicillins Rash    Family states was a "long time ago"  . Saxagliptin Rash    Family History  Problem Relation Age of Onset  . Breast cancer Mother   . Pancreatitis Father   . Breast cancer Sister   . Lung cancer Brother   . Melanoma Brother   . Throat cancer Brother     Social History Social History   Tobacco Use  . Smoking status: Former Smoker    Packs/day: 1.00    Years: 30.00    Pack years: 30.00    Quit date: 11/30/1999    Years since quitting: 19.2  . Smokeless tobacco: Never Used  Substance Use Topics  . Alcohol use: No  . Drug use: No    Review of Systems Constitutional: Negative for fever.  Positive for confusion this morning which is since resolved. Cardiovascular: Negative for chest pain. Respiratory: Negative for shortness of breath.  Negative for  cough. Gastrointestinal: Negative for abdominal pain, vomiting and diarrhea. Genitourinary: Negative for urinary compaints Musculoskeletal: Negative for musculoskeletal complaints Skin: Negative for skin complaints  Neurological: Negative for headache.  Denies any focal weakness or numbness. All other ROS negative  ____________________________________________   PHYSICAL EXAM:  VITAL SIGNS: ED Triage Vitals  Enc Vitals Group     BP 03/06/19 1155 (!) 207/78     Pulse Rate 03/06/19 1148 88     Resp 03/06/19 1155 20     Temp 03/06/19 1158 98 F (36.7 C)     Temp Source 03/06/19 1158 Oral     SpO2 03/06/19 1148 99 %     Weight 03/06/19 1150 171 lb (77.6 kg)     Height 03/06/19 1150 5\' 3"  (1.6 m)     Head Circumference --      Peak Flow --      Pain Score 03/06/19 1150 7     Pain Loc --      Pain Edu? --      Excl. in GC? --    Constitutional: Alert and oriented. Well appearing and in no distress. Eyes: Normal exam ENT      Head: Normocephalic and  atraumatic.      Mouth/Throat: Mucous membranes are moist. Cardiovascular: Normal rate, regular rhythm.  Respiratory: Normal respiratory effort without tachypnea nor retractions. Breath sounds are clear Gastrointestinal: Soft and nontender. No distention.   Musculoskeletal: Nontender with normal range of motion in all extremities.  Neurologic:  Normal speech and language. No gross focal neurologic deficits.  Equal grip strength bilaterally.  No pronator drift.  Equal strength in lower extremities.  Difficulty lifting her lower extremities bilaterally due to arthritis, chronic per patient. Skin:  Skin is warm, dry and intact.  Psychiatric: Mood and affect are normal.  ____________________________________________    EKG  EKG viewed and interpreted by myself shows normal sinus rhythm at 91 bpm with a narrow QRS, normal axis, normal intervals occasional PVC.  No concerning ST changes.  ____________________________________________    RADIOLOGY  CT head is negative. Chest x-ray is negative.  ____________________________________________   INITIAL IMPRESSION / ASSESSMENT AND PLAN / ED COURSE  Pertinent labs & imaging results that were available during my care of the patient were reviewed by me and considered in my medical decision making (see chart for details).   Patient presents emergency department for acute onset of confusion this morning.  Differential would include CVA, TIA, ICH, infectious etiology, metabolic or electrolyte abnormality.  We will check labs, chest x-ray and CT scan of the head.  We will continue to closely monitor while awaiting results.  Patient is largely back to her baseline at this time.  Patient's work-up is largely reassuring.  Labs are normal.  COVID negative, urinalysis is normal.  Patient's CT and chest x-ray are normal as well.  Patient is largely acting back to normal per daughter.  Highly suspect TIA this morning.  I discussed outpatient follow-up versus  admission daughter would strongly wish for the patient to be admitted to the hospital for an inpatient work-up.  Patient agreeable to plan of care.  Peggy [name] was evaluated in Emergency Department on 03/06/2019 for the symptoms described in the history of present illness. She was evaluated in the context of the global COVID-19 pandemic, which necessitated consideration that the patient might be at risk for infection with the SARS-CoV-2 virus that causes COVID-19. Institutional protocols and algorithms that pertain to the evaluation of patients at  risk for COVID-19 are in a state of rapid change based on information released by regulatory bodies including the CDC and federal and state organizations. These policies and algorithms were followed during the patient's care in the ED.  ____________________________________________   FINAL CLINICAL IMPRESSION(S) / ED DIAGNOSES  Confusion Transient ischemic attack   Harvest Dark, MD 03/06/19 1439

## 2019-03-06 NOTE — ED Notes (Signed)
X-ray at bedside

## 2019-03-06 NOTE — ED Notes (Signed)
Patient transported to CT 

## 2019-03-06 NOTE — ED Notes (Signed)
Pt assisted back in bed

## 2019-03-06 NOTE — ED Notes (Signed)
Called CT to tell them pt was ready

## 2019-03-06 NOTE — ED Notes (Signed)
Admitting Dr at bedside

## 2019-03-06 NOTE — ED Notes (Signed)
Dr. Paduchowski at bedside.  

## 2019-03-06 NOTE — ED Notes (Signed)
Called daughter and informed her of pt's room assignment

## 2019-03-06 NOTE — Progress Notes (Signed)
Family Meeting Note  Advance Directive:no  Today a meeting took place with the Patient.  Patient is able to participate.  The following clinical team members were present during this meeting:MD  The following were discussed:Patient's diagnosis: TIA, Patient's progosis: Unable to determine and Goals for treatment: Full Code  Additional follow-up to be provided: prn  Time spent during discussion:20 minutes  Peggy [name] Peggy Vanshika Jastrzebski, MD  

## 2019-03-07 ENCOUNTER — Observation Stay
Admit: 2019-03-07 | Discharge: 2019-03-07 | Disposition: A | Discharge status: Home / Self Care | Payer: Medicare Other | Attending: Internal Medicine | Admitting: Internal Medicine

## 2019-03-07 ENCOUNTER — Observation Stay: Payer: Medicare Other

## 2019-03-07 DIAGNOSIS — J9611 Chronic respiratory failure with hypoxia: Secondary | ICD-10-CM | POA: Diagnosis present

## 2019-03-07 DIAGNOSIS — I10 Essential (primary) hypertension: Secondary | ICD-10-CM | POA: Diagnosis present

## 2019-03-07 DIAGNOSIS — E119 Type 2 diabetes mellitus without complications: Secondary | ICD-10-CM | POA: Diagnosis present

## 2019-03-07 DIAGNOSIS — A084 Viral intestinal infection, unspecified: Secondary | ICD-10-CM | POA: Diagnosis present

## 2019-03-07 DIAGNOSIS — R4701 Aphasia: Secondary | ICD-10-CM | POA: Diagnosis present

## 2019-03-07 DIAGNOSIS — Z888 Allergy status to other drugs, medicaments and biological substances status: Secondary | ICD-10-CM | POA: Diagnosis not present

## 2019-03-07 DIAGNOSIS — Z955 Presence of coronary angioplasty implant and graft: Secondary | ICD-10-CM | POA: Diagnosis not present

## 2019-03-07 DIAGNOSIS — E785 Hyperlipidemia, unspecified: Secondary | ICD-10-CM | POA: Diagnosis present

## 2019-03-07 DIAGNOSIS — Z8673 Personal history of transient ischemic attack (TIA), and cerebral infarction without residual deficits: Secondary | ICD-10-CM | POA: Diagnosis not present

## 2019-03-07 DIAGNOSIS — Z881 Allergy status to other antibiotic agents status: Secondary | ICD-10-CM | POA: Diagnosis not present

## 2019-03-07 DIAGNOSIS — I169 Hypertensive crisis, unspecified: Secondary | ICD-10-CM | POA: Diagnosis present

## 2019-03-07 DIAGNOSIS — K219 Gastro-esophageal reflux disease without esophagitis: Secondary | ICD-10-CM | POA: Diagnosis present

## 2019-03-07 DIAGNOSIS — Z20828 Contact with and (suspected) exposure to other viral communicable diseases: Secondary | ICD-10-CM | POA: Diagnosis present

## 2019-03-07 DIAGNOSIS — Z880 Allergy status to penicillin: Secondary | ICD-10-CM | POA: Diagnosis not present

## 2019-03-07 DIAGNOSIS — D509 Iron deficiency anemia, unspecified: Secondary | ICD-10-CM | POA: Diagnosis present

## 2019-03-07 DIAGNOSIS — I480 Paroxysmal atrial fibrillation: Secondary | ICD-10-CM | POA: Diagnosis present

## 2019-03-07 DIAGNOSIS — J449 Chronic obstructive pulmonary disease, unspecified: Secondary | ICD-10-CM | POA: Diagnosis present

## 2019-03-07 DIAGNOSIS — G459 Transient cerebral ischemic attack, unspecified: Secondary | ICD-10-CM | POA: Diagnosis present

## 2019-03-07 DIAGNOSIS — R410 Disorientation, unspecified: Secondary | ICD-10-CM | POA: Diagnosis present

## 2019-03-07 DIAGNOSIS — Z9981 Dependence on supplemental oxygen: Secondary | ICD-10-CM | POA: Diagnosis not present

## 2019-03-07 DIAGNOSIS — Z882 Allergy status to sulfonamides status: Secondary | ICD-10-CM | POA: Diagnosis not present

## 2019-03-07 DIAGNOSIS — Z90710 Acquired absence of both cervix and uterus: Secondary | ICD-10-CM | POA: Diagnosis not present

## 2019-03-07 DIAGNOSIS — M1990 Unspecified osteoarthritis, unspecified site: Secondary | ICD-10-CM | POA: Diagnosis present

## 2019-03-07 DIAGNOSIS — Z87891 Personal history of nicotine dependence: Secondary | ICD-10-CM | POA: Diagnosis not present

## 2019-03-07 DIAGNOSIS — E039 Hypothyroidism, unspecified: Secondary | ICD-10-CM | POA: Diagnosis present

## 2019-03-07 LAB — LIPID PANEL
Cholesterol: 216 mg/dL — ABNORMAL HIGH (ref 0–200)
HDL: 33 mg/dL — ABNORMAL LOW (ref >40)
LDL Cholesterol: 110 mg/dL — ABNORMAL HIGH (ref 0–99)
Total CHOL/HDL Ratio: 6.5 RATIO
Triglycerides: 365 mg/dL — ABNORMAL HIGH (ref <150)
VLDL: 73 mg/dL — ABNORMAL HIGH (ref 0–40)

## 2019-03-07 LAB — RESPIRATORY PANEL BY PCR

## 2019-03-07 LAB — VITAMIN B12: Vitamin B-12: 911 pg/mL (ref 180–914)

## 2019-03-07 LAB — ECHOCARDIOGRAM COMPLETE
Height: 63 in
Weight: 2736 oz

## 2019-03-07 LAB — HEMOGLOBIN A1C
Hgb A1c MFr Bld: 7.7 % — ABNORMAL HIGH (ref 4.8–5.6)
Mean Plasma Glucose: 174.29 mg/dL

## 2019-03-07 LAB — CBC
HCT: 31.8 % — ABNORMAL LOW (ref 36.0–46.0)
Hemoglobin: 9.3 g/dL — ABNORMAL LOW (ref 12.0–15.0)
MCH: 21 pg — ABNORMAL LOW (ref 26.0–34.0)
MCHC: 29.2 g/dL — ABNORMAL LOW (ref 30.0–36.0)
MCV: 71.8 fL — ABNORMAL LOW (ref 80.0–100.0)
Platelets: 344 10*3/uL (ref 150–400)
RBC: 4.43 MIL/uL (ref 3.87–5.11)
RDW: 16.3 % — ABNORMAL HIGH (ref 11.5–15.5)
WBC: 16 10*3/uL — ABNORMAL HIGH (ref 4.0–10.5)
nRBC: 0 % (ref 0.0–0.2)

## 2019-03-07 LAB — BASIC METABOLIC PANEL
Anion gap: 10 (ref 5–15)
BUN: 17 mg/dL (ref 8–23)
CO2: 24 mmol/L (ref 22–32)
Calcium: 8.5 mg/dL — ABNORMAL LOW (ref 8.9–10.3)
Chloride: 99 mmol/L (ref 98–111)
Creatinine, Ser: 1 mg/dL (ref 0.44–1.00)
GFR calc Af Amer: 58 mL/min — ABNORMAL LOW (ref >60)
GFR calc non Af Amer: 50 mL/min — ABNORMAL LOW (ref >60)
Glucose, Bld: 161 mg/dL — ABNORMAL HIGH (ref 70–99)
Potassium: 3.7 mmol/L (ref 3.5–5.1)
Sodium: 133 mmol/L — ABNORMAL LOW (ref 135–145)

## 2019-03-07 LAB — MAGNESIUM: Magnesium: 1.6 mg/dL — ABNORMAL LOW (ref 1.7–2.4)

## 2019-03-07 LAB — GLUCOSE, CAPILLARY
Glucose-Capillary: 150 mg/dL — ABNORMAL HIGH (ref 70–99)
Glucose-Capillary: 204 mg/dL — ABNORMAL HIGH (ref 70–99)
Glucose-Capillary: 228 mg/dL — ABNORMAL HIGH (ref 70–99)
Glucose-Capillary: 260 mg/dL — ABNORMAL HIGH (ref 70–99)

## 2019-03-07 LAB — IRON AND TIBC
Iron: 72 ug/dL (ref 28–170)
Saturation Ratios: 17 % (ref 10.4–31.8)
TIBC: 432 ug/dL (ref 250–450)
UIBC: 361 ug/dL

## 2019-03-07 LAB — FERRITIN: Ferritin: 7 ng/mL — ABNORMAL LOW (ref 11–307)

## 2019-03-07 LAB — FOLATE: Folate: 24 ng/mL (ref >5.9)

## 2019-03-07 MED ORDER — METOPROLOL TARTRATE 5 MG/5ML IV SOLN
5.0000 mg | Freq: Once | INTRAVENOUS | Status: AC
  Administered 2019-03-07: 5 mg via INTRAVENOUS
  Filled 2019-03-07: qty 5

## 2019-03-07 MED ORDER — SODIUM CHLORIDE 0.9 % IV SOLN
510.0000 mg | Freq: Once | INTRAVENOUS | Status: AC
  Administered 2019-03-07: 09:00:00 510 mg via INTRAVENOUS
  Filled 2019-03-07: qty 17

## 2019-03-07 MED ORDER — HYDROCODONE-ACETAMINOPHEN 5-325 MG PO TABS: 1.0000 | ORAL_TABLET | Freq: Two times a day (BID) | ORAL | Status: DC | PRN

## 2019-03-07 MED ORDER — OXYCODONE HCL 5 MG PO TABS: 5.0000 mg | ORAL_TABLET | Freq: Four times a day (QID) | ORAL | Status: DC | PRN

## 2019-03-07 MED ORDER — METOPROLOL TARTRATE 5 MG/5ML IV SOLN
5.0000 mg | Freq: Four times a day (QID) | INTRAVENOUS | Status: DC | PRN
  Administered 2019-03-07 – 2019-03-08 (×2): 5 mg via INTRAVENOUS
  Filled 2019-03-07 (×2): qty 5

## 2019-03-07 MED ORDER — PANTOPRAZOLE SODIUM 40 MG PO TBEC
40.0000 mg | DELAYED_RELEASE_TABLET | Freq: Two times a day (BID) | ORAL | Status: DC
  Administered 2019-03-07 – 2019-03-08 (×2): 40 mg via ORAL
  Filled 2019-03-07 (×2): qty 1

## 2019-03-07 MED ORDER — METOPROLOL TARTRATE 5 MG/5ML IV SOLN: 5.0000 mg | Freq: Four times a day (QID) | INTRAVENOUS | Status: DC

## 2019-03-07 MED ORDER — MAGNESIUM SULFATE 4 GM/100ML IV SOLN
4.0000 g | Freq: Once | INTRAVENOUS | Status: AC
  Administered 2019-03-07: 4 g via INTRAVENOUS
  Filled 2019-03-07: qty 100

## 2019-03-07 MED ORDER — ONDANSETRON 4 MG PO TBDP
4.0000 mg | ORAL_TABLET | Freq: Once | ORAL | Status: AC
  Administered 2019-03-07: 4 mg via ORAL
  Filled 2019-03-07: qty 1

## 2019-03-07 NOTE — Evaluation (Signed)
Physical Therapy Evaluation Patient Details Name: Peggy [name] MRN: [id number] DOB: 08/02/1928 Today's Date: 03/07/2019   History of Present Illness  Pt admitted for TIA with complaints of AMS with difficulty with speech. History of Afib, HTN, DM, GERD, and COPD on 2L of O2 chronic.   Clinical Impression  Pt is a pleasant 85 year old female who was admitted for TIA.  Pt performs bed mobility with supervision, transfers with cga, and ambulation with cga and no AD. Further ambulation performed in hallway with RW with noted improvement in balance and mobility. Pt demonstrates deficits with L LE strength/coordination (heel->shin). Reports no sensation deficits. Would benefit from skilled PT to address above deficits and promote optimal return to PLOF. Currently recommending continued use of RW for fall prevention. Of note, pt being tested for respiratory virus with precautions, however no isolation box on door. PT confirmed droplet precautions with RN. Droplet mask donned in addition to faceshield. Recommend transition to [place] upon discharge from acute hospitalization.     Follow Up Recommendations Home health PT    Equipment Recommendations  None recommended by PT    Recommendations for Other Services       Precautions / Restrictions Precautions Precautions: Fall Restrictions Weight Bearing Restrictions: No      Mobility  Bed Mobility Overal bed mobility: Needs Assistance Bed Mobility: Supine to Sit     Supine to sit: Supervision     General bed mobility comments: safe technique with able to sit with upright posture.   Transfers Overall transfer level: Needs assistance Equipment used: None Transfers: Sit to/from Stand Sit to Stand: Min guard         General transfer comment: HHA provided, slightly unsteady with transfer.   Ambulation/Gait Ambulation/Gait assistance: Min guard Gait Distance (Feet): 5 Feet Assistive device: None Gait Pattern/deviations: Step-to  pattern     General Gait Details: ambulated to recliner with HHA. Slightly unsteady, however no formal LOB noted. Further ambulation performed in ther-ex.  Stairs            Wheelchair Mobility    Modified Rankin (Stroke Patients Only)       Balance Overall balance assessment: Needs assistance;History of Falls(1 fall in last 6 months) Sitting-balance support: Feet supported Sitting balance-Leahy Scale: Good     Standing balance support: No upper extremity supported Standing balance-Leahy Scale: Fair                               Pertinent Vitals/Pain Pain Assessment: No/denies pain(does report nausea at end of session)    Home Living Family/patient expects to be discharged to:: Private residence Living Arrangements: Children(daughter) Available Help at Discharge: Available PRN/intermittently Type of Home: House Home Access: Stairs to enter Entrance Stairs-Rails: Left Entrance Stairs-Number of Steps: 3 Home Layout: One level Home Equipment: Walker - 4 wheels;Grab bars - tub/shower;Grab bars - toilet;Cane - single point      Prior Function Level of Independence: Independent with assistive device(s)         Comments: Reports she occasionally uses SPC, however was indep with all ADLs PTA.      Hand Dominance        Extremity/Trunk Assessment   Upper Extremity Assessment Upper Extremity Assessment: Overall WFL for tasks assessed    Lower Extremity Assessment Lower Extremity Assessment: Generalized weakness(L LE grossly 3+/5 R LE grossly 4/5)       Communication   Communication: HOH  Cognition  Arousal/Alertness: Awake/alert Behavior During Therapy: WFL for tasks assessed/performed Overall Cognitive Status: Within Functional Limits for tasks assessed                                        General Comments      Exercises Other Exercises Other Exercises: Pt ambulated to bathroom with RW and cga. Safe technique with  supervision for transfer on/off toilet.  Other Exercises: Pt ambulated in hallway x 110' with RW with steady gait and symmetrical step length. Pt fatigues slightly and request to return back to room, unable to finish RN loop. All mobility performed on RA with sats at 96% pre and 98% post. Left on RA after session   Assessment/Plan    PT Assessment Patient needs continued PT services  PT Problem List Decreased strength;Decreased activity tolerance;Decreased balance;Decreased mobility;Cardiopulmonary status limiting activity       PT Treatment Interventions Gait training;Therapeutic exercise;Balance training    PT Goals (Current goals can be found in the Care Plan section)  Acute Rehab PT Goals Patient Stated Goal: to go home PT Goal Formulation: With patient Time For Goal Achievement: 03/21/19 Potential to Achieve Goals: Good    Frequency Min 2X/week   Barriers to discharge        Co-evaluation               AM-PAC PT "6 Clicks" Mobility  Outcome Measure Help needed turning from your back to your side while in a flat bed without using bedrails?: None Help needed moving from lying on your back to sitting on the side of a flat bed without using bedrails?: None Help needed moving to and from a bed to a chair (including a wheelchair)?: A Little Help needed standing up from a chair using your arms (e.g., wheelchair or bedside chair)?: A Little Help needed to walk in hospital room?: A Little Help needed climbing 3-5 steps with a railing? : A Little 6 Click Score: 20    End of Session Equipment Utilized During Treatment: Gait belt;Oxygen Activity Tolerance: Patient tolerated treatment well Patient left: in chair;with chair alarm set;with family/visitor present Nurse Communication: Mobility status PT Visit Diagnosis: Unsteadiness on feet (R26.81);Muscle weakness (generalized) (M62.81);Difficulty in walking, not elsewhere classified (R26.2)    Time: 2202-5427 PT Time  Calculation (min) (ACUTE ONLY): 38 min   Charges:   PT Evaluation $PT Eval Low Complexity: 1 Low PT Treatments $Gait Training: 8-22 mins $Therapeutic Activity: 8-22 mins        Elizabeth Palau, PT, DPT [phone]   Dazaria Macneill 03/07/2019, 2:20 PM

## 2019-03-07 NOTE — Care Management Obs Status (Signed)
[facility] NOTIFICATION   Patient Details  Name: ENEDELIA MARTORELLI MRN: [id number] Date of Birth: July 26, 1928   Medicare Observation Status Notification Given:  Yes    Shelbie Hutching, RN 03/07/2019, 3:43 PM

## 2019-03-07 NOTE — Progress Notes (Signed)
Patient refuses bed alarms at this time, patient educated about safety. 

## 2019-03-07 NOTE — Progress Notes (Signed)
SLP Cancellation Note  Patient Details Name: Peggy [name] MRN: [id number] DOB: Jun 15, 1928   Cancelled treatment:       Reason Eval/Treat Not Completed: SLP screened, no needs identified, will sign off(chart reviewed; consulted NSG then met w/ pt/Dtr in room). Pt denied any difficulty swallowing and is currently on a regular diet; tolerates swallowing pills w/ water per NSG. Dtr stated she did not notice any difficulty swallowing the sips of water/coffee she brought her Mother. Pt conversed and answered general questions w/out deficits noted; pt and Dtr denied any speech-language deficits this morning stating things are "much improved since yesterday".  No further skilled ST services indicated as pt appears at her baseline. Pt agreed. NSG to reconsult if any change in status while admitted. Pt and Dtr were encouraged to f/u w/ Neurology if any further questions.     Orinda Kenner, [place], CCC-SLP Jaquila Santelli 03/07/2019, 12:03 PM

## 2019-03-07 NOTE — TOC Initial Note (Signed)
Transition of Care [facility]) - Initial/Assessment Note    Patient Details  Name: Peggy [name] MRN: [id number] Date of Birth: [birth year]  Transition [facility]) CM/SW Contact:    Shelbie Hutching, RN Phone Number: 03/07/2019, 4:04 PM  Clinical Narrative:                 Patient placed under observation for TIA.  Patient is from home where she lives with one of her daughters.  Patient walks with a walker and is able to drive locally.  Patient has had [facility] in the past and would like to use them again.  Floydene Flock with [facility] given referral for home health.  Patient is on chronic O2 at 2L with [employer].  Patient requested different oxygen company if there was one located in [place].  There is not DME company in [place] for oxygen the closest is [place] so the patient will stay with [employer].  Patient will not need any DME at discharge.    Expected Discharge Plan: [place] Barriers to Discharge: Continued Medical Work up   Patient Goals and CMS Choice   CMS Medicare.gov Compare Post Acute Care list provided to:: Patient Choice offered to / list presented to : Patient  Expected Discharge Plan and Services Expected Discharge Plan: [place]   Discharge Planning Services: CM Consult Post Acute Care Choice: [place] arrangements for the past 2 months: [place] Arranged: RN, PT [facility] Agency: [place] ([facility]) Date [place]: 03/07/19 Time [place]: 1330 Representative spoke with at [place]: Floydene Flock  Prior Living Arrangements/Services Living arrangements for the past 2 months: [place] with:: Adult Children Patient language and need for interpreter reviewed:: No Do you feel safe going back to the place where you live?: Yes      Need for Family Participation in Patient Care: Yes (Comment)(TIA) Care giver support system in  place?: Yes (comment)(daughters) Current home services: DME(Walker, bedside commode, shower chair) Criminal Activity/Legal Involvement Pertinent to Current Situation/Hospitalization: No - Comment as needed  Activities of Daily Living Home Assistive Devices/Equipment: Environmental consultant (specify type), Cane (specify quad or straight) ADL Screening (condition at time of admission) Patient's cognitive ability adequate to safely complete daily activities?: Yes Is the patient deaf or have difficulty hearing?: Yes Does the patient have difficulty seeing, even when wearing glasses/contacts?: No Does the patient have difficulty concentrating, remembering, or making decisions?: No Patient able to express need for assistance with ADLs?: Yes Does the patient have difficulty dressing or bathing?: No Independently performs ADLs?: No Communication: Dependent Is this a change from baseline?: Change from baseline, expected to last <3 days Dressing (OT): Dependent Is this a change from baseline?: Change from baseline, expected to last <3days Grooming: Dependent Is this a change from baseline?: Change from baseline, expected to last <3 days Feeding: Needs assistance Is this a change from baseline?: Change from baseline, expected to last <3 days Bathing: Dependent Is this a change from baseline?: Change from baseline, expected to last <3 days Toileting: Dependent Is this a change from baseline?: Change from baseline, expected to last <3 days In/Out Bed: Dependent Is this a change from baseline?: Change from baseline, expected to last <3 days Walks in Home: Needs assistance Is this a  change from baseline?: Pre-admission baseline Does the patient have difficulty walking or climbing stairs?: Yes Weakness of Legs: Both Weakness of Arms/Hands: None  Permission Sought/Granted Permission sought to share information with : Case Manager, Other (comment) Permission granted to share information with : Yes, Verbal  Permission Granted     Permission granted to share info w AGENCY: [facility]  Permission granted to share info w Relationship: Daughters     Emotional Assessment Appearance:: Appears stated age Attitude/Demeanor/Rapport: Engaged Affect (typically observed): Accepting Orientation: : Oriented to Self, Oriented to Place, Oriented to  Time, Oriented to Situation Alcohol / Substance Use: Not Applicable Psych Involvement: No (comment)  Admission diagnosis:  Confusion [R41.0] Patient Active Problem List   Diagnosis Date Noted  . Uncontrolled hypertension 11/22/2018  . Chronic respiratory failure with hypoxia (HCC) 10/08/2017  . Simple chronic bronchitis (HCC) 10/08/2017  . SOB (shortness of breath) 09/24/2017  . History of stroke 08/17/2017  . Acute respiratory failure with hypoxia and hypercarbia (HCC) 08/07/2017  . Respiratory failure (HCC) 08/07/2017  . History of GI bleed 07/07/2017  . Atrial fibrillation with RVR (HCC) 06/30/2017  . Bilateral carotid artery disease (HCC) 04/14/2017  . Healthcare maintenance 04/14/2017  . Dizziness 04/07/2017  . 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 TIA (transient ischemic attack) 12/10/2016  . CVA (cerebral vascular accident) (HCC) 12/09/2016  . Bilateral arm pain 11/27/2016  . Myocardial infarction (HCC) 07/30/2015  . Urinary frequency 02/09/2015  . RUQ abdominal pain 01/26/2015  . Shortness of breath 01/26/2015  . Anxiety 01/26/2015  . Chronic pruritus 12/21/2014  . Cerebral vascular accident (HCC) 11/16/2014  . Chronic low back pain 11/16/2014  . Adult hypothyroidism 11/15/2014  . Allergic rhinitis 11/15/2014  . Body mass index (BMI) of 28.0-28.9 in adult 11/15/2014  . Arthritis 11/15/2014  . Cramps of lower extremity 11/15/2014  . Essential (primary) hypertension 11/15/2014  . Acid reflux 11/15/2014  . Hypercholesteremia 11/15/2014  . Cannot sleep 11/15/2014  . Psoriasis 11/15/2014  . Itch of skin  11/15/2014  . Restless leg 11/15/2014  . Diabetes mellitus, type 2 (HCC) 11/15/2014  . Breath shortness 11/15/2014  . Dermatitis due to unknown cause 04/10/2014  . Arteriosclerosis of coronary artery 02/20/2014  . AF (paroxysmal atrial fibrillation) (HCC) 02/20/2014  . Temporary cerebral vascular dysfunction 02/20/2014  . DD (diverticular disease) 10/05/2013   PCP:  Lauro Regulus, MD Pharmacy:   [facility] [phone] - [name], [place] - [address] AT [facility] OF [address] [place] [address] Phone: [phone] Fax: [phone]  [id number] - [place], [place] - [address] [place] [address] Phone: [phone] Fax: [phone]  [id number] - [place], [place] - [address] AT [facility] [address] [facility] [address] [facility] [address] [place] [address] Phone: [phone] Fax: [phone]     Social Determinants of Health (SDOH) Interventions    Readmission Risk Interventions Readmission Risk Prevention Plan 11/24/2018  PCP or Specialist Appt within 5-7 Days Complete  Home Care Screening Complete  Medication Review (RN CM) Complete  Some recent data might be hidden

## 2019-03-07 NOTE — Progress Notes (Signed)
[employer] [facility]   PATIENT NAME: Peggy [name]    MR#:  [id number]  DATE OF BIRTH:  [birth year]  SUBJECTIVE:   Patient is having some nausea this morning. She denies any abdominal pain. She did have three episodes of vomiting overnight. Per daughter at bedside, she thinks the vomiting may be due to uncontrolled GERD. Daughter states that patient has chronic diarrhea. Patient denies any focal weakness, numbness, or tingling. No speech changes or vision changes since admission.  REVIEW OF SYSTEMS:  Review of Systems  Constitutional: Negative for chills and fever.  HENT: Negative for congestion and sore throat.   Eyes: Negative for blurred vision and double vision.  Respiratory: Negative for cough and shortness of breath.   Cardiovascular: Negative for chest pain and palpitations.  Gastrointestinal: Positive for nausea and vomiting. Negative for abdominal pain and diarrhea.  Genitourinary: Negative for dysuria and urgency.  Musculoskeletal: Negative for back pain and neck pain.  Neurological: Negative for dizziness and headaches.  Psychiatric/Behavioral: Negative for depression. The patient is not nervous/anxious.     DRUG ALLERGIES:   Allergies  Allergen Reactions  . Diphenhydramine Rash    AGITATION/DELIRIUM  . Metformin Diarrhea  . Oxybutynin Other (See Comments)    dizzy  . Amlodipine Rash  . Ciprofloxacin Rash  . Lisinopril Rash  . Penicillins Rash    Family states was a "long time ago"  . Saxagliptin Rash  . Sulfamethoxazole-Trimethoprim Rash   VITALS:  Blood pressure (!) 120/56, pulse 64, temperature 98.2 F (36.8 C), resp. rate 16, height 5\' 3"  (1.6 m), weight 77.6 kg, SpO2 100 %. PHYSICAL EXAMINATION:  Physical Exam  GENERAL:  Sitting up in chair with no acute distress. Tired-appearing. HEENT: Head atraumatic, normocephalic. Pupils equal, round, reactive to light and accommodation. No scleral icterus. Extraocular muscles intact.  Oropharynx and nasopharynx clear.  NECK:  Supple, no jugular venous distention. No thyroid enlargement. LUNGS: Lungs are clear to auscultation bilaterally. No wheezes, crackles, rhonchi. No use of accessory muscles of respiration.  CARDIOVASCULAR: Irregularly irregular rhythm, mildly tachycardic, S1, S2 normal. No murmurs, rubs, or gallops.  ABDOMEN: Soft, nontender, nondistended. Bowel sounds present.  EXTREMITIES: No pedal edema, cyanosis, or clubbing.  NEUROLOGIC: CN 2-12 intact, no focal deficits. 5/5 muscle strength throughout all extremities. Sensation intact throughout. Gait not checked.  PSYCHIATRIC: The patient is alert and oriented x 3.  SKIN: No obvious rash, lesion, or ulcer.  LABORATORY PANEL:  Female CBC Recent Labs  Lab 03/07/19 0429  WBC 16.0*  HGB 9.3*  HCT 31.8*  PLT 344   ------------------------------------------------------------------------------------------------------------------ Chemistries  Recent Labs  Lab 03/06/19 1149 03/06/19 2336 03/07/19 0429  NA 134*  --  133*  K 4.1  --  3.7  CL 100  --  99  CO2 23  --  24  GLUCOSE 149*  --  161*  BUN 16  --  17  CREATININE 0.79  --  1.00  CALCIUM 8.7*  --  8.5*  MG  --  1.6*  --   AST 22  --   --   ALT 16  --   --   ALKPHOS 65  --   --   BILITOT 0.9  --   --    RADIOLOGY:  Dg Chest 1 View  Result Date: 03/07/2019 CLINICAL DATA:  Leukocytosis.  Vomiting. EXAM: CHEST  1 VIEW COMPARISON:  Chest radiograph 03/06/2019 FINDINGS: Monitoring leads overlie the patient. Stable cardiac and mediastinal contours. No  consolidative pulmonary opacities. No pleural effusion or pneumothorax. Thoracic spine degenerative changes. IMPRESSION: No active disease. Electronically Signed   By: Annia Belt M.D.   On: 03/07/2019 08:12   Dg Abd 1 View  Result Date: 03/07/2019 CLINICAL DATA:  Leukocytosis, vomiting, history hypertension, COPD, diabetes mellitus, atrial fibrillation EXAM: ABDOMEN - 1 VIEW COMPARISON:  Portable exam  0752 hours compared to CT abdomen and pelvis 11/22/2018 FINDINGS: Nonobstructive bowel gas pattern. No bowel dilatation or bowel wall thickening. 4 mm calculus projects over inferior pole of RIGHT kidney. Osseous demineralization with degenerative disc and facet disease changes of the lumbar spine. Chronic inferior endplate compression deformity of L1 vertebral body. IMPRESSION: Nonobstructive bowel gas pattern. 4 mm RIGHT renal calculus. Electronically Signed   By: Ulyses Southward M.D.   On: 03/07/2019 08:13   Ct Head Wo Contrast  Result Date: 03/06/2019 CLINICAL DATA:  Encephalopathy.  Altered mental status. EXAM: CT HEAD WITHOUT CONTRAST TECHNIQUE: Contiguous axial images were obtained from the base of the skull through the vertex without intravenous contrast. COMPARISON:  11/22/2018 FINDINGS: Brain: Ventricles, cisterns and other CSF spaces are within normal. Prominent perivascular space left lentiform nucleus unchanged. No mass, mass effect, shift of midline structures or acute hemorrhage. No evidence of acute infarction. Minimal chronic ischemic microvascular disease. Vascular: No hyperdense vessel or unexpected calcification. Skull: Normal. Negative for fracture or focal lesion. Sinuses/Orbits: No acute finding. Other: None. IMPRESSION: No acute findings. Minimal chronic ischemic microvascular disease. Electronically Signed   By: Elberta Fortis M.D.   On: 03/06/2019 13:38   Mr Brain Wo Contrast  Result Date: 03/06/2019 CLINICAL DATA:  TIA, initial exam. Additional history provided: Patient presented to the emergency department this morning with difficulty speaking and confusion, now back at baseline. EXAM: MRI HEAD WITHOUT CONTRAST TECHNIQUE: Multiplanar, multiecho pulse sequences of the brain and surrounding structures were obtained without intravenous contrast. COMPARISON:  Head CT 03/06/2019, brain MRI 11/22/2018 FINDINGS: Brain: Several sequences are motion degraded. There is no convincing evidence of  acute infarct. No evidence of intracranial mass. No midline shift or extra-axial fluid collection. No chronic intracranial blood products. Moderate scattered and confluent T2/FLAIR hyperintensity within the cerebral white matter consistent with chronic small vessel ischemic disease. Redemonstrated prominent perivascular spaces along the anterior commissure. Mild generalized parenchymal atrophy. Unchanged prominence of the CSF spaces within the posterior fossa bilaterally, which may reflect atrophy or subdural hygromas. Vascular: Flow voids maintained within the proximal large arterial vessels. Skull and upper cervical spine: Significantly motion degraded sagittal T1 sequence. Within this limitation, no focal marrow lesion. Pannus at the C1-C2 articulation. Sinuses/Orbits: Visualized orbits demonstrate no acute abnormality. Visualized paranasal sinuses are clear. Visualized mastoid air cells are clear. IMPRESSION: 1. No evidence of acute intracranial abnormality, including acute infarct. 2. Unchanged generalized parenchymal atrophy and chronic small vessel ischemic disease. Electronically Signed   By: Jackey Loge   On: 03/06/2019 22:08   US Carotid Bilateral (at [facility] Only)  Result Date: 03/07/2019 CLINICAL DATA:  TIA. History of hypertension, CAD, hyperlipidemia and diabetes. Former smoker. EXAM: BILATERAL CAROTID DUPLEX ULTRASOUND TECHNIQUE: Wallace Cullens scale imaging, color Doppler and duplex ultrasound were performed of bilateral carotid and vertebral arteries in the neck. COMPARISON:  None. FINDINGS: Criteria: Quantification of carotid stenosis is based on velocity parameters that correlate the residual internal carotid diameter with NASCET-based stenosis levels, using the diameter of the distal internal carotid lumen as the denominator for stenosis measurement. The following velocity measurements were obtained: RIGHT ICA: 117/21 cm/sec  CCA: 27/0 cm/sec SYSTOLIC ICA/CCA RATIO:  1.2 ECA: 166 cm/sec LEFT ICA:  85/15 cm/sec CCA: 62/37 cm/sec SYSTOLIC ICA/CCA RATIO:  1.1 ECA: 107 cm/sec RIGHT CAROTID ARTERY: There is a minimal amount of eccentric echogenic plaque involving the mid aspect the right common carotid artery (image 10), morphologically similar to the 11/2016 examination. There are no elevated peak systolic velocities within the interrogated course the right internal carotid artery to suggest a hemodynamically significant stenosis. RIGHT VERTEBRAL ARTERY:  Antegrade flow LEFT CAROTID ARTERY: There is a moderate amount of eccentric mixed echogenic plaque involving the proximal aspect of the left common carotid artery (image 38), morphologically similar to the 11/2016 examination. There is a minimal amount of intimal thickening involving the left carotid bulb, extending to involve the origin and proximal aspects of the left internal carotid artery (representative image 53), not resulting in elevated peak systolic velocities within the interrogated course the left internal carotid artery to suggest a hemodynamically significant stenosis. LEFT VERTEBRAL ARTERY:  Antegrade flow Note made of a cardiac arrhythmia. IMPRESSION: 1. Minimal to moderate amount of bilateral atherosclerotic plaque, left greater than right, morphologically similar to the 11/2016 examination, and again not resulting in hemodynamically significant stenosis within either internal carotid artery. 2. Note made of a cardiac arrhythmia, new compared to the 11/2016 examination. Clinical correlation is advised. Further evaluation with ECG monitoring could be performed as indicated. Electronically Signed   By: [name] M.D.   On: 03/07/2019 07:58   Dg Chest Portable 1 View  Result Date: 03/06/2019 CLINICAL DATA:  Cough. EXAM: PORTABLE CHEST 1 VIEW COMPARISON:  07/14/2018 and 01/26/2015 FINDINGS: Lungs are adequately inflated without lobar consolidation or effusion. Mild biapical pleural thickening. Subtle nodular density over the left mid to upper  lung unchanged from 2016. Cardiomediastinal silhouette and remainder of the exam is unchanged. Remainder the exam is unchanged. IMPRESSION: No active disease. Electronically Signed   By: Marin Olp M.D.   On: 03/06/2019 13:35   ASSESSMENT AND PLAN:   Expressive aphasia- likely TIA vs hypertensive crisis, given that her BP was markedly elevated on admission. Symptoms have resolved. Recurrent TIAs may be due to a-fib not on anticoagulation. CHADsVASC score is a 9 (12% annual risk of stroke) and HAS-BLED is a 9% annual risk of major bleed). -CT head and MRI brain were unremarkable -LDL 110- started on lipitor 40mg  daily -Started on aspirin 81mg  daily this admission -ECHO and carotid dopplers pending -Neurology following -PT/OT/SLP consults -Cardiac monitoring -Discussed anticoagulation with daughter at bedside- she will think about it   Nausea and vomiting- may be due to viral gastroenteritis vs uncontrolled GERD -Abdominal x-ray this morning showed a non-obstructive bowel gas pattern -Continue IV anti-emetics -Increase protonix to 40mg  bid -If no improvement, may need to consider CT abdomen/pelvis  Leukocytosis-mild.  Likely reactive.  May be due to above. Patient has been afebrile. -CXR x 2 negative for pneumonia, UA unremarkable -RVP ordered -Monitor  Paroxysmal atrial fibrillation- patient went into a-fib overnight.  -Continue home metoprolol -Not on any anticoagulation at home due to history of bleeding on Xarelto and eliquis -Discussed restarting anticoagulation with daughter- she will think about it -Continue aspirin 81mg  daily for now -Needs to follow-up with cardiology as an outpatient  Chronic respiratory failure secondary to COPD on 2 L O2 at baseline -Continue home inhalers -Duonebs prn  Hypertension- BP has greatly improved. -Continue home metoprolol -IV labetalol prn  Type 2 diabetes- glucose mildly elevated -Continue Lantus -SSI  Hypothyroidism-stable  -Continue  home Synthroid  Iron deficiency anemia- hemoglobin is low, but at baseline. Anemia panel with ferritin of 7. -IV feraheme ordered today  Daughter, Olegario MessierKathy, at bedside- updated on the plan.  All the records are reviewed and case discussed with Care Management/Social Worker. Management plans discussed with the patient, family and they are in agreement.  CODE STATUS: Full Code  TOTAL TIME TAKING CARE OF THIS PATIENT: 40 minutes.   More than 50% of the time was spent in counseling/coordination of care: YES  POSSIBLE D/C IN 1-2 DAYS, DEPENDING ON CLINICAL CONDITION.   Jinny BlossomKaty D Graciemae Delisle M.D on 03/07/2019 at 11:43 AM  Between 7am to 6pm - Pager - [phone]  After 6pm go to [email or web address] - Scientist, research (life sciences)password [email or web address]  [employer] [place] [facility]  Office  [phone]  CC: Primary care physician; Lauro RegulusAnderson, Marshall W, MD  Note: This dictation was prepared with Dragon dictation along with smaller phrase technology. Any transcriptional errors that result from this process are unintentional.

## 2019-03-07 NOTE — Progress Notes (Signed)
*  PRELIMINARY RESULTS* Echocardiogram 2D Echocardiogram has been performed.  Sherrie Sport 03/07/2019, 9:57 AM

## 2019-03-07 NOTE — Evaluation (Signed)
Occupational Therapy Evaluation Patient Details Name: Peggy [name] MRN: [id number] DOB: August 04, 1928 Today's Date: 03/07/2019    History of Present Illness Pt admitted for TIA with complaints of AMS with difficulty with speech. History of Afib, HTN, DM, GERD, and COPD on 2L of O2 chronic.    Clinical Impression   Pt seen for OT evaluation this date. Prior to hospital admission, pt was generally independent with basic ADL tasks, requiring assist from daughter for medication mgt. Currently pt demonstrates impairments in strength, activity tolerance, and balance requiring CGA to Min assist for LB ADL and functional mobility for ADL with RW. Pt endorsed more recent throat clearing and pt/dtr instructed in strategies to minimize aspiration/choking risk. SLP notified of pt's report and states will follow up next date. Pt would benefit from skilled OT services while in the hospital to address noted impairments and functional limitations (see below for any additional details) in order to maximize safety and independence while minimizing falls risk and caregiver burden. Do not anticipate need for skilled OT services after discharge.     Follow Up Recommendations  No OT follow up    Equipment Recommendations  None recommended by OT    Recommendations for Other Services       Precautions / Restrictions Precautions Precautions: Fall Restrictions Weight Bearing Restrictions: No      Mobility Bed Mobility Overal bed mobility: Needs Assistance Bed Mobility: Supine to Sit     Supine to sit: Supervision     General bed mobility comments: pt declined 2/2 RN requesting pt "hold her LUE still" for the IV  Transfers Overall transfer level: Needs assistance Equipment used: None Transfers: Sit to/from Stand Sit to Stand: Min guard         General transfer comment: pt declined 2/2 RN requesting pt "hold her LUE still" for the IV    Balance Overall balance assessment: Needs assistance;History  of Falls(1 fall in last 6 months) Sitting-balance support: Feet supported Sitting balance-Leahy Scale: Good     Standing balance support: No upper extremity supported Standing balance-Leahy Scale: Fair                             ADL either performed or assessed with clinical judgement   ADL                                         General ADL Comments: CGA to Min A for LB ADL, CGA for functional ADL transfers     Vision Patient Visual Report: No change from baseline Vision Assessment?: No apparent visual deficits     Perception     Praxis      Pertinent Vitals/Pain Pain Assessment: No/denies pain     Hand Dominance Right   Extremity/Trunk Assessment Upper Extremity Assessment Upper Extremity Assessment: Overall WFL for tasks assessed   Lower Extremity Assessment Lower Extremity Assessment: Generalized weakness(L LE grossly 3+/5 R LE grossly 4/5)       Communication Communication Communication: HOH   Cognition Arousal/Alertness: Awake/alert Behavior During Therapy: WFL for tasks assessed/performed Overall Cognitive Status: Within Functional Limits for tasks assessed                                     General Comments  Exercises Exercises: Other exercises Other Exercises Other Exercises: Pt instructed in body positioning and strategies to minimize aspiration/choking risk when eating/drinking as pt reports some mild throat clearing that is "new" to her; spoke with SLP who will follow up with pt   Shoulder Instructions      Home Living Family/patient expects to be discharged to:: Private residence Living Arrangements: Children(daughter) Available Help at Discharge: Available PRN/intermittently Type of Home: House Home Access: Stairs to enter [employer] of Steps: 3 Entrance Stairs-Rails: Left Home Layout: One level     Bathroom Shower/Tub: Tub/shower unit         Home Equipment: Walker -  4 wheels;Grab bars - tub/shower;Grab bars - toilet;Cane - single point          Prior Functioning/Environment Level of Independence: Independent with assistive device(s)        Comments: Reports she occasionally uses SPC, however was indep with all ADLs PTA. Dtr endorses pt did "struggle" with socks/shoes at times but didn't need assist. Dtr manages medications        OT Problem List: Decreased strength;Impaired balance (sitting and/or standing);Decreased activity tolerance;Decreased knowledge of use of DME or AE      OT Treatment/Interventions: Self-care/ADL training;Therapeutic exercise;Therapeutic activities;DME and/or AE instruction;Patient/family education;Balance training    OT Goals(Current goals can be found in the care plan section) Acute Rehab OT Goals Patient Stated Goal: to go home OT Goal Formulation: With patient/family Time For Goal Achievement: 03/21/19 Potential to Achieve Goals: Good ADL Goals Pt Will Perform Lower Body Dressing: sit to/from stand;with min guard assist Pt Will Transfer to Toilet: with supervision;ambulating;regular height toilet(LRAD for amb)  OT Frequency: Min 1X/week   Barriers to D/C:            Co-evaluation              AM-PAC OT "6 Clicks" Daily Activity     Outcome Measure Help from another person eating meals?: None Help from another person taking care of personal grooming?: None Help from another person toileting, which includes using toliet, bedpan, or urinal?: A Little Help from another person bathing (including washing, rinsing, drying)?: A Little Help from another person to put on and taking off regular upper body clothing?: None Help from another person to put on and taking off regular lower body clothing?: A Little 6 Click Score: 21   End of Session    Activity Tolerance: Patient tolerated treatment well Patient left: in bed;with call bell/phone within reach;with bed alarm set;with family/visitor present  OT  Visit Diagnosis: Other abnormalities of gait and mobility (R26.89);Muscle weakness (generalized) (M62.81)                Time: 1411-1430 OT Time Calculation (min): 19 min Charges:  OT General Charges $OT Visit: 1 Visit OT Evaluation $OT Eval Low Complexity: 1 Low OT Treatments $Self Care/Home Management : 8-22 mins  Richrd Prime, MPH, MS, OTR/L ascom [phone] 03/07/19, 3:11 PM

## 2019-03-07 NOTE — Consult Note (Signed)
Reason for Consult: TIA Referring Physician: Hospitalist  CC: TIA HPI: Peggy [name] is an 85 y.o. female  Admitted on account of trouble findings words and generalized weakness,.  K 85 y.o. female with history of atrial fibrillation, hypertension, hyperlipidemia, type 2 diabetes, history TIA who presented to the ED with difficulty speaking the morning of admission. .  Patient states upon awakening up she was confused and "knew what she wanted to say, but could not get the right words out".  This then resolved couple hours later., p  Patient denies visual disturbances, sz like activity, denies any weakness, numbness, or tingling of the extremities.  She admits for generalized weakness more on the legs.  No chest pain or abdominal pain, she does endorse some mild shortness of breath over the last several days. No cough.  Patient noted to be hypertensive in 200/s per daughter Head ct No acute findings and with Minimal chronic ischemic microvascular disease. MRI: No evidence of acute intracranial abnormality, including acute infarct.Unchanged generalized parenchymal atrophy and chronic small vessel ischemic disease. Echo pendingMinimal to moderate amount of bilateral atherosclerotic plaque, left greater than right, morphologically similar to the 11/2016 examination, and again not resulting in hemodynamically significant stenosis within either internal carotid artery. Note made of a cardiac arrhythmia, new compared to the 11/2016 examination. Clinical correlation is advised. Further evaluation with ECG monitoring could be performed as indicated.  Patient states she is feeling better     Past Medical History:  Diagnosis Date  . A-fib (HCC)   . Allergy   . Arthritis   . COPD (chronic obstructive pulmonary disease) (HCC)   . Diabetes mellitus without complication (HCC)   . GERD (gastroesophageal reflux disease)   . Hyperlipidemia   . Hypertension   . Oxygen dependent    2 liters  . Stroke  [facility])    5 years ago per daughter  . TIA (transient ischemic attack)     Past Surgical History:  Procedure Laterality Date  . ABDOMINAL HYSTERECTOMY  1970  . BACK SURGERY  2013  . colectomy Right 10/08/2013   Dr. Egbert Garibaldi  . CORONARY ANGIOPLASTY WITH STENT PLACEMENT      Family History  Problem Relation Age of Onset  . Breast cancer Mother   . Pancreatitis Father   . Breast cancer Sister   . Lung cancer Brother   . Melanoma Brother   . Throat cancer Brother     Social History:  reports that she quit smoking about 19 years ago. She has a 30.00 pack-year smoking history. She has never used smokeless tobacco. She reports that she does not drink alcohol or use drugs.  Allergies  Allergen Reactions  . Diphenhydramine Rash    AGITATION/DELIRIUM  . Metformin Diarrhea  . Oxybutynin Other (See Comments)    dizzy  . Amlodipine Rash  . Ciprofloxacin Rash  . Lisinopril Rash  . Penicillins Rash    Family states was a "long time ago"  . Saxagliptin Rash  . Sulfamethoxazole-Trimethoprim Rash    Medications: I have reviewed the patient's current medications.  ROS: As per HPI Physical Examination: Blood pressure (!) 120/56, pulse 64, temperature 98.2 F (36.8 C), resp. rate 16, height  (1.6 m), weight 77.6 kg, SpO2 100 %.  Neurologic Examination Alert, awake, oriented x4, follows commands, speech nle PERLA, EOMI, no nystagmus, face symmetrical, VFF, face sensation nle, uvula /tongue midline No motor deficit appreciated,  No sensory deficits appreciated No coordination deficit appreciated DTR, and gait not  checked at time of examination.  Results for orders placed or performed during the hospital encounter of 03/06/19 (from the past 48 hour(s))  Comprehensive metabolic panel     Status: Abnormal   Collection Time: 03/06/19 11:49 AM  Result Value Ref Range   Sodium 134 (L) 135 - 145 mmol/L   Potassium 4.1 3.5 - 5.1 mmol/L   Chloride 100 98 - 111 mmol/L   CO2 23 22 - 32  mmol/L   Glucose, Bld 149 (H) 70 - 99 mg/dL   BUN 16 8 - 23 mg/dL   Creatinine, Ser 0.79 0.44 - 1.00 mg/dL   Calcium 8.7 (L) 8.9 - 10.3 mg/dL   Total Protein 6.6 6.5 - 8.1 g/dL   Albumin 3.8 3.5 - 5.0 g/dL   AST 22 15 - 41 U/L   ALT 16 0 - 44 U/L   Alkaline Phosphatase 65 38 - 126 U/L   Total Bilirubin 0.9 0.3 - 1.2 mg/dL   GFR calc non Af Amer >60 >60 mL/min   GFR calc Af Amer >60 >60 mL/min   Anion gap 11 5 - 15    Comment: Performed at [facility], [address] [place], [place] [address]  CBC     Status: Abnormal   Collection Time: 03/06/19 11:49 AM  Result Value Ref Range   WBC 11.0 (H) 4.0 - 10.5 K/uL   RBC 4.79 3.87 - 5.11 MIL/uL   Hemoglobin 10.1 (L) 12.0 - 15.0 g/dL   HCT 35.3 (L) 36.0 - 46.0 %   MCV 73.7 (L) 80.0 - 100.0 fL   MCH 21.1 (L) 26.0 - 34.0 pg   MCHC 28.6 (L) 30.0 - 36.0 g/dL   RDW 16.4 (H) 11.5 - 15.5 %   Platelets 323 150 - 400 K/uL   nRBC 0.0 0.0 - 0.2 %    Comment: Performed at [facility], [address] [place], [place] [address]  Protime-INR - (order if patient is taking Coumadin / Warfarin)     Status: None   Collection Time: 03/06/19 11:49 AM  Result Value Ref Range   Prothrombin Time 14.0 11.4 - 15.2 seconds   INR 1.1 0.8 - 1.2    Comment: (NOTE) INR goal varies based on device and disease states. Performed at [facility], [address] [place], [place] [address]   Urinalysis, Complete w Microscopic     Status: Abnormal   Collection Time: 03/06/19 11:49 AM  Result Value Ref Range   Color, Urine STRAW (A) YELLOW   APPearance CLEAR (A) CLEAR   Specific Gravity, Urine 1.010 1.005 - 1.030   pH 7.0 5.0 - 8.0   Glucose, UA 50 (A) NEGATIVE mg/dL   Hgb urine dipstick NEGATIVE NEGATIVE   Bilirubin Urine NEGATIVE NEGATIVE   Ketones, ur NEGATIVE NEGATIVE mg/dL   Protein, ur NEGATIVE NEGATIVE mg/dL   Nitrite NEGATIVE NEGATIVE   Leukocytes,Ua NEGATIVE NEGATIVE   RBC / HPF 0-5 0 - 5 RBC/hpf   WBC, UA 0-5 0 - 5  WBC/hpf   Bacteria, UA NONE SEEN NONE SEEN   Squamous Epithelial / LPF 0-5 0 - 5    Comment: Performed at [facility], [address], [place], [place] [address]  SARS Coronavirus 2 [facility] order, Performed in [facility] hospital lab) Nasopharyngeal Nasopharyngeal Swab     Status: None   Collection Time: 03/06/19 12:19 PM   Specimen: Nasopharyngeal Swab  Result Value Ref Range   SARS Coronavirus 2 NEGATIVE NEGATIVE  Comment: (NOTE) If result is NEGATIVE SARS-CoV-2 target nucleic acids are NOT DETECTED. The SARS-CoV-2 RNA is generally detectable in upper and lower  respiratory specimens during the acute phase of infection. The lowest  concentration of SARS-CoV-2 viral copies this assay can detect is 250  copies / mL. A negative result does not preclude SARS-CoV-2 infection  and should not be used as the sole basis for treatment or other  patient management decisions.  A negative result may occur with  improper specimen collection / handling, submission of specimen other  than nasopharyngeal swab, presence of viral mutation(s) within the  areas targeted by this assay, and inadequate number of viral copies  (<250 copies / mL). A negative result must be combined with clinical  observations, patient history, and epidemiological information. If result is POSITIVE SARS-CoV-2 target nucleic acids are DETECTED. The SARS-CoV-2 RNA is generally detectable in upper and lower  respiratory specimens dur ing the acute phase of infection.  Positive  results are indicative of active infection with SARS-CoV-2.  Clinical  correlation with patient history and other diagnostic information is  necessary to determine patient infection status.  Positive results do  not rule out bacterial infection or co-infection with other viruses. If result is PRESUMPTIVE POSTIVE SARS-CoV-2 nucleic acids MAY BE PRESENT.   A presumptive positive result was obtained on the submitted specimen  and confirmed  on repeat testing.  While 2019 novel coronavirus  (SARS-CoV-2) nucleic acids may be present in the submitted sample  additional confirmatory testing may be necessary for epidemiological  and / or clinical management purposes  to differentiate between  SARS-CoV-2 and other Sarbecovirus currently known to infect humans.  If clinically indicated additional testing with an alternate test  methodology [phone]) is advised. The SARS-CoV-2 RNA is generally  detectable in upper and lower respiratory sp ecimens during the acute  phase of infection. The expected result is Negative. Fact Sheet for Patients:  BoilerBrush.com.cy Fact Sheet for Healthcare Providers: https://pope.com/ This test is not yet approved or cleared by the [place] FDA and has been authorized for detection and/or diagnosis of SARS-CoV-2 by FDA under an Emergency Use Authorization (EUA).  This EUA will remain in effect (meaning this test can be used) for the duration of the COVID-19 declaration under Section 564(b)(1) of the Act, 21 U.S.C. section 360bbb-3(b)(1), unless the authorization is terminated or revoked sooner. Performed at [facility], [address] [place], [place] [address]   Glucose, capillary     Status: Abnormal   Collection Time: 03/06/19  8:20 PM  Result Value Ref Range   Glucose-Capillary 130 (H) 70 - 99 mg/dL  Magnesium     Status: Abnormal   Collection Time: 03/06/19 11:36 PM  Result Value Ref Range   Magnesium 1.6 (L) 1.7 - 2.4 mg/dL    Comment: Performed at [facility], [address] [place], [place] [address]  Glucose, capillary     Status: Abnormal   Collection Time: 03/06/19 11:36 PM  Result Value Ref Range   Glucose-Capillary 211 (H) 70 - 99 mg/dL  Hemoglobin O1B     Status: Abnormal   Collection Time: 03/07/19  4:29 AM  Result Value Ref Range   Hgb A1c MFr Bld 7.7 (H) 4.8 - 5.6 %    Comment: (NOTE) Pre diabetes:           5.7%-6.4% Diabetes:              >6.4% Glycemic control for   <7.0%  adults with diabetes    Mean Plasma Glucose 174.29 mg/dL    Comment: Performed at [facility], [address], [place], [place] [address]  Lipid panel     Status: Abnormal   Collection Time: 03/07/19  4:29 AM  Result Value Ref Range   Cholesterol 216 (H) 0 - 200 mg/dL   Triglycerides 191 (H) <150 mg/dL   HDL 33 (L) >47 mg/dL   Total CHOL/HDL Ratio 6.5 RATIO   VLDL 73 (H) 0 - 40 mg/dL   LDL Cholesterol 829 (H) 0 - 99 mg/dL    Comment:        Total Cholesterol/HDL:CHD Risk Coronary Heart Disease Risk Table                     Men   Women  1/2 Average Risk   3.4   3.3  Average Risk       5.0   4.4  2 X Average Risk   9.6   7.1  3 X Average Risk  23.4   11.0        Use the calculated Patient Ratio above and the CHD Risk Table to determine the patient's CHD Risk.        ATP III CLASSIFICATION (LDL):  <100     mg/dL   Optimal  562-130  mg/dL   Near or Above                    Optimal  130-159  mg/dL   Borderline  865-784  mg/dL   High  >696     mg/dL   Very High Performed at [facility], [address] [place], [place] [address]   Ferritin     Status: Abnormal   Collection Time: 03/07/19  4:29 AM  Result Value Ref Range   Ferritin 7 (L) 11 - 307 ng/mL    Comment: Performed at [facility], [address] [place], [place] [address]  Iron and TIBC     Status: None   Collection Time: 03/07/19  4:29 AM  Result Value Ref Range   Iron 72 28 - 170 ug/dL   TIBC 401 027 - 253 ug/dL   Saturation Ratios 17 10.4 - 31.8 %   UIBC 361 ug/dL    Comment: Performed at [facility], [address] [place], [place] [address]  Vitamin B12     Status: None   Collection Time: 03/07/19  4:29 AM  Result Value Ref Range   Vitamin B-12 911 180 - 914 pg/mL    Comment: (NOTE) This assay is not validated for testing neonatal or myeloproliferative syndrome specimens for Vitamin  B12 levels. Performed at [facility], [address], [place], [place] [address]   Folate     Status: None   Collection Time: 03/07/19  4:29 AM  Result Value Ref Range   Folate 24.0 >5.9 ng/mL    Comment: Performed at [facility], [address] [place], [place] [address]  CBC     Status: Abnormal   Collection Time: 03/07/19  4:29 AM  Result Value Ref Range   WBC 16.0 (H) 4.0 - 10.5 K/uL   RBC 4.43 3.87 - 5.11 MIL/uL   Hemoglobin 9.3 (L) 12.0 - 15.0 g/dL   HCT 87.5 (L) 64.3 - 32.9 %   MCV 71.8 (L) 80.0 - 100.0 fL   MCH 21.0 (L) 26.0 - 34.0 pg   MCHC  29.2 (L) 30.0 - 36.0 g/dL   RDW 16.116.3 (H) 09.611.5 - 04.515.5 %   Platelets 344 150 - 400 K/uL   nRBC 0.0 0.0 - 0.2 %    Comment: Performed at [facility], [address] [place], [place] [address]  Basic metabolic panel     Status: Abnormal   Collection Time: 03/07/19  4:29 AM  Result Value Ref Range   Sodium 133 (L) 135 - 145 mmol/L   Potassium 3.7 3.5 - 5.1 mmol/L   Chloride 99 98 - 111 mmol/L   CO2 24 22 - 32 mmol/L   Glucose, Bld 161 (H) 70 - 99 mg/dL   BUN 17 8 - 23 mg/dL   Creatinine, Ser 1.911.00 0.44 - 1.00 mg/dL   Calcium 8.5 (L) 8.9 - 10.3 mg/dL   GFR calc non Af Amer 50 (L) >60 mL/min   GFR calc Af Amer 58 (L) >60 mL/min   Anion gap 10 5 - 15    Comment: Performed at [facility], [address] [place], [place] [address]  Glucose, capillary     Status: Abnormal   Collection Time: 03/07/19  8:19 AM  Result Value Ref Range   Glucose-Capillary 150 (H) 70 - 99 mg/dL    Recent Results (from the past 240 hour(s))  SARS Coronavirus 2 [facility](Hospital order, Performed in [facility] hospital lab) Nasopharyngeal Nasopharyngeal Swab     Status: None   Collection Time: 03/06/19 12:19 PM   Specimen: Nasopharyngeal Swab  Result Value Ref Range Status   SARS Coronavirus 2 NEGATIVE NEGATIVE Final    Comment: (NOTE) If result is NEGATIVE SARS-CoV-2 target nucleic acids are NOT DETECTED. The  SARS-CoV-2 RNA is generally detectable in upper and lower  respiratory specimens during the acute phase of infection. The lowest  concentration of SARS-CoV-2 viral copies this assay can detect is 250  copies / mL. A negative result does not preclude SARS-CoV-2 infection  and should not be used as the sole basis for treatment or other  patient management decisions.  A negative result may occur with  improper specimen collection / handling, submission of specimen other  than nasopharyngeal swab, presence of viral mutation(s) within the  areas targeted by this assay, and inadequate number of viral copies  (<250 copies / mL). A negative result must be combined with clinical  observations, patient history, and epidemiological information. If result is POSITIVE SARS-CoV-2 target nucleic acids are DETECTED. The SARS-CoV-2 RNA is generally detectable in upper and lower  respiratory specimens dur ing the acute phase of infection.  Positive  results are indicative of active infection with SARS-CoV-2.  Clinical  correlation with patient history and other diagnostic information is  necessary to determine patient infection status.  Positive results do  not rule out bacterial infection or co-infection with other viruses. If result is PRESUMPTIVE POSTIVE SARS-CoV-2 nucleic acids MAY BE PRESENT.   A presumptive positive result was obtained on the submitted specimen  and confirmed on repeat testing.  While 2019 novel coronavirus  (SARS-CoV-2) nucleic acids may be present in the submitted sample  additional confirmatory testing may be necessary for epidemiological  and / or clinical management purposes  to differentiate between  SARS-CoV-2 and other Sarbecovirus currently known to infect humans.  If clinically indicated additional testing with an alternate test  methodology [phone]) is advised. The SARS-CoV-2 RNA is generally  detectable in upper and lower respiratory sp ecimens during the acute   phase of infection. The expected  result is Negative. Fact Sheet for Patients:  BoilerBrush.com.cyhttps://www.fda.gov/media/136312/download Fact Sheet for Healthcare Providers: https://pope.com/https://www.fda.gov/media/136313/download This test is not yet approved or cleared by the [place] FDA and has been authorized for detection and/or diagnosis of SARS-CoV-2 by FDA under an Emergency Use Authorization (EUA).  This EUA will remain in effect (meaning this test can be used) for the duration of the COVID-19 declaration under Section 564(b)(1) of the Act, 21 U.S.C. section 360bbb-3(b)(1), unless the authorization is terminated or revoked sooner. Performed at [facility], [address] [place], [place] [address]     Dg Chest 1 View  Result Date: 03/07/2019 CLINICAL DATA:  Leukocytosis.  Vomiting. EXAM: CHEST  1 VIEW COMPARISON:  Chest radiograph 03/06/2019 FINDINGS: Monitoring leads overlie the patient. Stable cardiac and mediastinal contours. No consolidative pulmonary opacities. No pleural effusion or pneumothorax. Thoracic spine degenerative changes. IMPRESSION: No active disease. Electronically Signed   By: Annia Beltrew  Davis M.D.   On: 03/07/2019 08:12   Dg Abd 1 View  Result Date: 03/07/2019 CLINICAL DATA:  Leukocytosis, vomiting, history hypertension, COPD, diabetes mellitus, atrial fibrillation EXAM: ABDOMEN - 1 VIEW COMPARISON:  Portable exam 0752 hours compared to CT abdomen and pelvis 11/22/2018 FINDINGS: Nonobstructive bowel gas pattern. No bowel dilatation or bowel wall thickening. 4 mm calculus projects over inferior pole of RIGHT kidney. Osseous demineralization with degenerative disc and facet disease changes of the lumbar spine. Chronic inferior endplate compression deformity of L1 vertebral body. IMPRESSION: Nonobstructive bowel gas pattern. 4 mm RIGHT renal calculus. Electronically Signed   By: Ulyses SouthwardMark  Boles M.D.   On: 03/07/2019 08:13   Ct Head Wo Contrast  Result Date: 03/06/2019 CLINICAL DATA:   Encephalopathy.  Altered mental status. EXAM: CT HEAD WITHOUT CONTRAST TECHNIQUE: Contiguous axial images were obtained from the base of the skull through the vertex without intravenous contrast. COMPARISON:  11/22/2018 FINDINGS: Brain: Ventricles, cisterns and other CSF spaces are within normal. Prominent perivascular space left lentiform nucleus unchanged. No mass, mass effect, shift of midline structures or acute hemorrhage. No evidence of acute infarction. Minimal chronic ischemic microvascular disease. Vascular: No hyperdense vessel or unexpected calcification. Skull: Normal. Negative for fracture or focal lesion. Sinuses/Orbits: No acute finding. Other: None. IMPRESSION: No acute findings. Minimal chronic ischemic microvascular disease. Electronically Signed   By: Elberta Fortisaniel  Boyle M.D.   On: 03/06/2019 13:38   Mr Brain Wo Contrast  Result Date: 03/06/2019 CLINICAL DATA:  TIA, initial exam. Additional history provided: Patient presented to the emergency department this morning with difficulty speaking and confusion, now back at baseline. EXAM: MRI HEAD WITHOUT CONTRAST TECHNIQUE: Multiplanar, multiecho pulse sequences of the brain and surrounding structures were obtained without intravenous contrast. COMPARISON:  Head CT 03/06/2019, brain MRI 11/22/2018 FINDINGS: Brain: Several sequences are motion degraded. There is no convincing evidence of acute infarct. No evidence of intracranial mass. No midline shift or extra-axial fluid collection. No chronic intracranial blood products. Moderate scattered and confluent T2/FLAIR hyperintensity within the cerebral white matter consistent with chronic small vessel ischemic disease. Redemonstrated prominent perivascular spaces along the anterior commissure. Mild generalized parenchymal atrophy. Unchanged prominence of the CSF spaces within the posterior fossa bilaterally, which may reflect atrophy or subdural hygromas. Vascular: Flow voids maintained within the proximal  large arterial vessels. Skull and upper cervical spine: Significantly motion degraded sagittal T1 sequence. Within this limitation, no focal marrow lesion. Pannus at the C1-C2 articulation. Sinuses/Orbits: Visualized orbits demonstrate no acute abnormality. Visualized paranasal sinuses are clear. Visualized mastoid air cells are clear. IMPRESSION:  1. No evidence of acute intracranial abnormality, including acute infarct. 2. Unchanged generalized parenchymal atrophy and chronic small vessel ischemic disease. Electronically Signed   By: Jackey Loge   On: 03/06/2019 22:08   US Carotid Bilateral (at [facility] Only)  Result Date: 03/07/2019 CLINICAL DATA:  TIA. History of hypertension, CAD, hyperlipidemia and diabetes. Former smoker. EXAM: BILATERAL CAROTID DUPLEX ULTRASOUND TECHNIQUE: Wallace Cullens scale imaging, color Doppler and duplex ultrasound were performed of bilateral carotid and vertebral arteries in the neck. COMPARISON:  None. FINDINGS: Criteria: Quantification of carotid stenosis is based on velocity parameters that correlate the residual internal carotid diameter with NASCET-based stenosis levels, using the diameter of the distal internal carotid lumen as the denominator for stenosis measurement. The following velocity measurements were obtained: RIGHT ICA: 117/21 cm/sec CCA: 97/7 cm/sec SYSTOLIC ICA/CCA RATIO:  1.2 ECA: 166 cm/sec LEFT ICA: 85/15 cm/sec CCA: 78/13 cm/sec SYSTOLIC ICA/CCA RATIO:  1.1 ECA: 107 cm/sec RIGHT CAROTID ARTERY: There is a minimal amount of eccentric echogenic plaque involving the mid aspect the right common carotid artery (image 10), morphologically similar to the 11/2016 examination. There are no elevated peak systolic velocities within the interrogated course the right internal carotid artery to suggest a hemodynamically significant stenosis. RIGHT VERTEBRAL ARTERY:  Antegrade flow LEFT CAROTID ARTERY: There is a moderate amount of eccentric mixed echogenic plaque involving the  proximal aspect of the left common carotid artery (image 38), morphologically similar to the 11/2016 examination. There is a minimal amount of intimal thickening involving the left carotid bulb, extending to involve the origin and proximal aspects of the left internal carotid artery (representative image 53), not resulting in elevated peak systolic velocities within the interrogated course the left internal carotid artery to suggest a hemodynamically significant stenosis. LEFT VERTEBRAL ARTERY:  Antegrade flow Note made of a cardiac arrhythmia. IMPRESSION: 1. Minimal to moderate amount of bilateral atherosclerotic plaque, left greater than right, morphologically similar to the 11/2016 examination, and again not resulting in hemodynamically significant stenosis within either internal carotid artery. 2. Note made of a cardiac arrhythmia, new compared to the 11/2016 examination. Clinical correlation is advised. Further evaluation with ECG monitoring could be performed as indicated. Electronically Signed   By: Simonne Come M.D.   On: 03/07/2019 07:58   Dg Chest Portable 1 View  Result Date: 03/06/2019 CLINICAL DATA:  Cough. EXAM: PORTABLE CHEST 1 VIEW COMPARISON:  07/14/2018 and 01/26/2015 FINDINGS: Lungs are adequately inflated without lobar consolidation or effusion. Mild biapical pleural thickening. Subtle nodular density over the left mid to upper lung unchanged from 2016. Cardiomediastinal silhouette and remainder of the exam is unchanged. Remainder the exam is unchanged. IMPRESSION: No active disease. Electronically Signed   By: Elberta Fortis M.D.   On: 03/06/2019 13:35     Assessment/Plan: K 85 y.o. female with history of atrial fibrillation, hypertension, hyperlipidemia, type 2 diabetes, history TIA who presented to the ED with difficulty speaking and generalized weakness more on the leg. Neuro exam is benign. Ct and MRI with no acute findings. Patient may have suffered a TIA, but included in  differentiel hypertensive crisis given her history and poorly controlled BP. Less likely sz.  RECS:  - neuroprotectives measures including normothermia, normoglycemia, correct electrolytes/metabolic abnlities, teat infections. - continue stroke work up: Echo, carotid US - continue ASA - consider cardiology consult - Control BP. diabetes - PT/OT/OOB      03/07/2019, 11:08 AM

## 2019-03-08 ENCOUNTER — Other Ambulatory Visit: Payer: Self-pay | Admitting: Internal Medicine

## 2019-03-08 LAB — BASIC METABOLIC PANEL
Anion gap: 10 (ref 5–15)
BUN: 26 mg/dL — ABNORMAL HIGH (ref 8–23)
CO2: 26 mmol/L (ref 22–32)
Calcium: 8.7 mg/dL — ABNORMAL LOW (ref 8.9–10.3)
Chloride: 100 mmol/L (ref 98–111)
Creatinine, Ser: 1.23 mg/dL — ABNORMAL HIGH (ref 0.44–1.00)
GFR calc Af Amer: 45 mL/min — ABNORMAL LOW (ref >60)
GFR calc non Af Amer: 39 mL/min — ABNORMAL LOW (ref >60)
Glucose, Bld: 143 mg/dL — ABNORMAL HIGH (ref 70–99)
Potassium: 3.8 mmol/L (ref 3.5–5.1)
Sodium: 136 mmol/L (ref 135–145)

## 2019-03-08 LAB — CBC
HCT: 30.2 % — ABNORMAL LOW (ref 36.0–46.0)
Hemoglobin: 8.8 g/dL — ABNORMAL LOW (ref 12.0–15.0)
MCH: 21.4 pg — ABNORMAL LOW (ref 26.0–34.0)
MCHC: 29.1 g/dL — ABNORMAL LOW (ref 30.0–36.0)
MCV: 73.3 fL — ABNORMAL LOW (ref 80.0–100.0)
Platelets: 281 10*3/uL (ref 150–400)
RBC: 4.12 MIL/uL (ref 3.87–5.11)
RDW: 16 % — ABNORMAL HIGH (ref 11.5–15.5)
WBC: 10.7 10*3/uL — ABNORMAL HIGH (ref 4.0–10.5)
nRBC: 0 % (ref 0.0–0.2)

## 2019-03-08 LAB — CREATININE, SERUM
Creatinine, Ser: 1.08 mg/dL — ABNORMAL HIGH (ref 0.44–1.00)
GFR calc Af Amer: 53 mL/min — ABNORMAL LOW (ref >60)
GFR calc non Af Amer: 46 mL/min — ABNORMAL LOW (ref >60)

## 2019-03-08 LAB — GLUCOSE, CAPILLARY
Glucose-Capillary: 150 mg/dL — ABNORMAL HIGH (ref 70–99)
Glucose-Capillary: 239 mg/dL — ABNORMAL HIGH (ref 70–99)

## 2019-03-08 LAB — MAGNESIUM: Magnesium: 2.5 mg/dL — ABNORMAL HIGH (ref 1.7–2.4)

## 2019-03-08 MED ORDER — RISAQUAD PO CAPS
2.0000 | ORAL_CAPSULE | Freq: Every day | ORAL | Status: DC
  Administered 2019-03-08: 2 via ORAL
  Filled 2019-03-08: qty 2

## 2019-03-08 MED ORDER — ASPIRIN 81 MG PO TBEC: 81.0000 mg | DELAYED_RELEASE_TABLET | Freq: Every day | ORAL | 0 refills | Status: DC

## 2019-03-08 MED ORDER — FERROUS SULFATE 324 (65 FE) MG PO TBEC: 324.0000 mg | DELAYED_RELEASE_TABLET | Freq: Every day | ORAL | 0 refills | Status: DC

## 2019-03-08 MED ORDER — ATORVASTATIN CALCIUM 40 MG PO TABS: 40.0000 mg | ORAL_TABLET | Freq: Every day | ORAL | 0 refills | Status: DC

## 2019-03-08 MED ORDER — SODIUM CHLORIDE 0.9 % IV BOLUS
500.0000 mL | Freq: Once | INTRAVENOUS | Status: AC
  Administered 2019-03-08: 500 mL via INTRAVENOUS

## 2019-03-08 NOTE — Plan of Care (Signed)
  Problem: Education: Goal: Knowledge of disease or condition will improve Outcome: Progressing   Problem: Self-Care: Goal: Ability to participate in self-care as condition permits will improve Outcome: Progressing   Problem: Self-Care: Goal: Ability to communicate needs accurately will improve Outcome: Progressing   

## 2019-03-08 NOTE — Progress Notes (Signed)
Pt for discharge  Cerat back and results  To md/ ok to d/cd/.instructiopns to pt and dtr.  Iv d/cd. meds diet activity and  F/u discussed. Ready for discharge

## 2019-03-08 NOTE — TOC Transition Note (Signed)
Transition of Care [facility]) - CM/SW Discharge Note   Patient Details  Name: CALIYAH SIEH MRN: [id number] Date of Birth: 08/24/1928  Transition of Care [facility]) CM/SW Contact:  Shelbie Hutching, RN Phone Number: 03/08/2019, 2:25 PM   Clinical Narrative:    Patient will discharge home with home health PT through [facility].  Floydene Flock with [facility] is aware of discharge today.  Patient's daughter will provide transportation home.  Patient has also been provided with information about [employer], which can provide resources for the community.  Patient's daughter expressed that they would be interested in help with housekeeping and cooking while the daughter is at work.    Final next level of care: Home w Home Health Services Barriers to Discharge: Barriers Resolved   Patient Goals and CMS Choice   CMS Medicare.gov Compare Post Acute Care list provided to:: Patient Choice offered to / list presented to : Patient  Discharge Placement                       Discharge Plan and Services   Discharge Planning Services: CM Consult Post Acute Care Choice: [place]: PT [facility] Agency: [place] ([place]) Date [facility] Agency Contacted: 03/07/19 Time [place]: 1330 Representative spoke with at [place]: [place] ([place]) Interventions     Readmission Risk Interventions Readmission Risk Prevention Plan 11/24/2018  PCP or Specialist Appt within 5-7 Days Complete  Home Care Screening Complete  Medication Review (RN CM) Complete  Some recent data might be hidden

## 2019-03-08 NOTE — Discharge Instructions (Signed)
It was so nice to meet you during this hospitalization!  You came into the hospital with speech difficulties. We think you may have had a TIA. Your MRI did not show a stroke.  I have prescribed the following medications: 1. Please take aspirin 81mg  daily to decrease your risk of future strokes 2. Please take lipitor 40mg  daily to lower your cholesterol level 3. Please take ferrous sulfate 324mg  daily- this is an iron pill. I checked your iron level and it was low when you were here.  Take care, Dr. Brett Albino

## 2019-03-08 NOTE — Discharge Summary (Signed)
[facility] - [place] at [facility]   PATIENT NAME: Peggy [name]    MR#:  [id number]  DATE OF BIRTH:  [birth year]  DATE OF ADMISSION:  03/06/2019   ADMITTING PHYSICIAN: [facility] Stall, MD  DATE OF DISCHARGE: 03/08/19  PRIMARY CARE PHYSICIAN: Lauro Regulus, MD   ADMISSION DIAGNOSIS:  Confusion [R41.0] DISCHARGE DIAGNOSIS:  Active Problems:   TIA (transient ischemic attack)  SECONDARY DIAGNOSIS:   Past Medical History:  Diagnosis Date  . A-fib (HCC)   . Allergy   . Arthritis   . COPD (chronic obstructive pulmonary disease) (HCC)   . Diabetes mellitus without complication (HCC)   . GERD (gastroesophageal reflux disease)   . Hyperlipidemia   . Hypertension   . Oxygen dependent    2 liters  . Stroke [facility])    5 years ago per daughter  . TIA (transient ischemic attack)    HOSPITAL COURSE:   Talmadge is an 85 year old female who presented to the ED with an episode of expressive dysphasia that lasted a couple hours.  Her symptoms resolved on their own.  In the ED, she was noted to be hypertensive.  CT head was negative.  She was admitted for further management.  Expressive aphasia- likely TIA vs hypertensive crisis, given that her BP was markedly elevated on admission. Symptoms have resolved. Recurrent TIAs may be due to a-fib not on anticoagulation. CHADsVASC score is a 9 (12% annual risk of stroke) and HAS-BLED is a 9% annual risk of major bleed). -CT head and MRI brain were unremarkable -LDL 110- started on lipitor 40mg  daily -Started on aspirin 81mg  daily this admission -ECHO and carotid dopplers pending -Neurology following -PT recommended home health PT-this was ordered on discharge -Discussed anticoagulation with daughter- she would like to keep patient on aspirin for now and will discuss anticoagulation further with her patient's PCP and cardiologist.  Nausea and vomiting- may be due to viral gastroenteritis vs uncontrolled GERD -Resolved -Home  Protonix was continued  Leukocytosis- improved. Likely reactive. Patient has been afebrile. -CXR x 2 negative for pneumonia, UA unremarkable -RVP was negative  Paroxysmal atrial fibrillation-patient had sporadic episodes of elevated heart rate this admission. -Continued home metoprolol -Not on any anticoagulation at home due to history of bleeding on xarelto and eliquis -Patient and daughter will discuss anticoagulation with cardiology as an outpatient -Prescribed aspirin 81mg    Chronic respiratory failure secondary to COPD on 2 L O2 at baseline -Continued home inhalers -Duonebs prn  Hypertension- BP has greatly improved. -Continued home metoprolol  Type 2 diabetes-stable -Home diabetes medicines were continued on discharge  Hypothyroidism-stable -Continued home Synthroid  Iron deficiency anemia-hemoglobin is low, but at baseline. Anemia panel with ferritin of 7. -Patient was given a dose of IV iron during this hospitalization was discharged on ferrous sulfate 325 mg daily -Needs CBC and anemia panel rechecked as an outpatient  DISCHARGE CONDITIONS:  TIA Paroxysmal atrial fibrillation Chronic respiratory failure secondary to COPD on 2 L O2 Hypertension Type 2 diabetes Hypothyroidism Iron deficiency anemia CONSULTS OBTAINED:  Treatment Team:  Marita Snellen, MD DRUG ALLERGIES:   Allergies  Allergen Reactions  . Diphenhydramine Rash    AGITATION/DELIRIUM  . Metformin Diarrhea  . Oxybutynin Other (See Comments)    dizzy  . Amlodipine Rash  . Ciprofloxacin Rash  . Lisinopril Rash  . Penicillins Rash    Family states was a "long time ago"  . Saxagliptin Rash  . Sulfamethoxazole-Trimethoprim Rash   DISCHARGE MEDICATIONS:  Allergies as of 03/08/2019      Reactions   Diphenhydramine Rash   AGITATION/DELIRIUM   Metformin Diarrhea   Oxybutynin Other (See Comments)   dizzy   Amlodipine Rash   Ciprofloxacin Rash   Lisinopril Rash   Penicillins Rash    Family states was a "long time ago"   Saxagliptin Rash   Sulfamethoxazole-trimethoprim Rash      Medication List    STOP taking these medications   azithromycin 250 MG tablet Commonly known as: ZITHROMAX   predniSONE 10 MG tablet Commonly known as: DELTASONE     TAKE these medications   aspirin 81 MG EC tablet Take 1 tablet (81 mg total) by mouth daily. Start taking on: March 09, 2019   atorvastatin 40 MG tablet Commonly known as: LIPITOR Take 1 tablet (40 mg total) by mouth daily at 6 PM.   budesonide-formoterol 80-4.5 MCG/ACT inhaler Commonly known as: SYMBICORT Inhale 1 puff into the lungs 2 (two) times daily.   ferrous sulfate 324 (65 Fe) MG Tbec Take 1 tablet (324 mg total) by mouth daily.   gabapentin 100 MG capsule Commonly known as: NEURONTIN Take 100 mg by mouth at bedtime.   guaiFENesin 600 MG 12 hr tablet Commonly known as: MUCINEX Take by mouth 2 (two) times daily.   guaiFENesin-codeine 100-10 MG/5ML syrup Commonly known as: ROBITUSSIN AC Take 5 mLs by mouth 3 (three) times daily as needed for cough.   HumuLIN 70/30 KwikPen (70-30) 100 UNIT/ML PEN Generic drug: Insulin Isophane & Regular Human Inject 20 Units into the skin 2 (two) times daily. What changed: how much to take   HYDROcodone-acetaminophen 5-325 MG tablet Commonly known as: NORCO/VICODIN Take 1 tablet by mouth 2 (two) times daily. As needed for chronic low back pain   hydrOXYzine 25 MG tablet Commonly known as: ATARAX/VISTARIL Take 25 mg by mouth 4 (four) times daily as needed for itching.   lactobacillus acidophilus Tabs tablet Take 2 tablets by mouth daily.   levothyroxine 50 MCG tablet Commonly known as: SYNTHROID Take 1 tablet (50 mcg total) by mouth daily. PATIENT NEEDS TO SCHEDULE OFFICE VISIT FOR FOLLOW UP   meclizine 12.5 MG tablet Commonly known as: ANTIVERT Take 1 tablet (12.5 mg total) by mouth 2 (two) times daily.   metoprolol succinate 100 MG 24 hr tablet  Commonly known as: TOPROL-XL Take 1 tablet (100 mg total) by mouth daily. Take with or immediately following a meal.   OXYGEN Inhale 2 L into the lungs.   pantoprazole 40 MG tablet Commonly known as: PROTONIX Take 1 tablet (40 mg total) by mouth daily.   Ventolin HFA 108 (90 Base) MCG/ACT inhaler Generic drug: albuterol Inhale 2 puffs into the lungs every 6 (six) hours as needed.   vitamin B-12 1000 MCG tablet Commonly known as: CYANOCOBALAMIN Take 1,000 mcg by mouth daily.   Vitamin D3 125 MCG (5000 UT) Tabs Take 5,000 Units by mouth daily.        DISCHARGE INSTRUCTIONS:  1.  Follow-up with PCP in 5 days 2.  Follow-up with cardiology in 2 weeks 3.  Start aspirin and Lipitor daily 4.  Start ferrous sulfate 325 mg daily 5.  Needs CBC and BMP rechecked as an outpatient 6.  Daughter and patient will discuss anticoagulation with PCP and cardiology DIET:  Cardiac diet and Diabetic diet DISCHARGE CONDITION:  Stable ACTIVITY:  Activity as tolerated OXYGEN:  Home Oxygen: Yes.    Oxygen Delivery: 2 liters/min via Patient connected to nasal  cannula oxygen DISCHARGE LOCATION:  home   If you experience worsening of your admission symptoms, develop shortness of breath, life threatening emergency, suicidal or homicidal thoughts you must seek medical attention immediately by calling 911 or calling your MD immediately  if symptoms less severe.  You Must read complete instructions/literature along with all the possible adverse reactions/side effects for all the Medicines you take and that have been prescribed to you. Take any new Medicines after you have completely understood and accpet all the possible adverse reactions/side effects.   Please note  You were cared for by a hospitalist during your hospital stay. If you have any questions about your discharge medications or the care you received while you were in the hospital after you are discharged, you can call the unit and asked  to speak with the hospitalist on call if the hospitalist that took care of you is not available. Once you are discharged, your primary care physician will handle any further medical issues. Please note that NO REFILLS for any discharge medications will be authorized once you are discharged, as it is imperative that you return to your primary care physician (or establish a relationship with a primary care physician if you do not have one) for your aftercare needs so that they can reassess your need for medications and monitor your lab values.    On the day of Discharge:  VITAL SIGNS:  Blood pressure (!) 144/56, pulse 61, temperature 98 F (36.7 C), temperature source Oral, resp. rate 20, height 5\' 3"  (1.6 m), weight 77.6 kg, SpO2 97 %. PHYSICAL EXAMINATION:  GENERAL:  85 y.o.-year-old patient lying in the bed with no acute distress.  EYES: Pupils equal, round, reactive to light and accommodation. No scleral icterus. Extraocular muscles intact.  HEENT: Head atraumatic, normocephalic. Oropharynx and nasopharynx clear.  NECK:  Supple, no jugular venous distention. No thyroid enlargement, no tenderness.  LUNGS: Normal breath sounds bilaterally, no wheezing, rales,rhonchi or crepitation. No use of accessory muscles of respiration.  CARDIOVASCULAR: Irregularly irregular rhythm, regular rate, S1, S2 normal. No murmurs, rubs, or gallops.  ABDOMEN: Soft, non-tender, non-distended. Bowel sounds present. No organomegaly or mass.  EXTREMITIES: No pedal edema, cyanosis, or clubbing.  NEUROLOGIC: Cranial nerves II through XII are intact. Muscle strength 5/5 in all extremities. Sensation intact. Gait not checked.  PSYCHIATRIC: The patient is alert and oriented x 3.  SKIN: No obvious rash, lesion, or ulcer.  DATA REVIEW:   CBC Recent Labs  Lab 03/08/19 0512  WBC 10.7*  HGB 8.8*  HCT 30.2*  PLT 281    Chemistries  Recent Labs  Lab 03/06/19 1149  03/08/19 0512  NA 134*   < > 136  K 4.1   < > 3.8   CL 100   < > 100  CO2 23   < > 26  GLUCOSE 149*   < > 143*  BUN 16   < > 26*  CREATININE 0.79   < > 1.23*  CALCIUM 8.7*   < > 8.7*  MG  --    < > 2.5*  AST 22  --   --   ALT 16  --   --   ALKPHOS 65  --   --   BILITOT 0.9  --   --    < > = values in this interval not displayed.     Microbiology Results  Results for orders placed or performed during the hospital encounter of 03/06/19  SARS Coronavirus 2 [facility](Hospital  order, Performed in [facility] lab) Nasopharyngeal Nasopharyngeal Swab     Status: None   Collection Time: 03/06/19 12:19 PM   Specimen: Nasopharyngeal Swab  Result Value Ref Range Status   SARS Coronavirus 2 NEGATIVE NEGATIVE Final    Comment: (NOTE) If result is NEGATIVE SARS-CoV-2 target nucleic acids are NOT DETECTED. The SARS-CoV-2 RNA is generally detectable in upper and lower  respiratory specimens during the acute phase of infection. The lowest  concentration of SARS-CoV-2 viral copies this assay can detect is 250  copies / mL. A negative result does not preclude SARS-CoV-2 infection  and should not be used as the sole basis for treatment or other  patient management decisions.  A negative result may occur with  improper specimen collection / handling, submission of specimen other  than nasopharyngeal swab, presence of viral mutation(s) within the  areas targeted by this assay, and inadequate number of viral copies  (<250 copies / mL). A negative result must be combined with clinical  observations, patient history, and epidemiological information. If result is POSITIVE SARS-CoV-2 target nucleic acids are DETECTED. The SARS-CoV-2 RNA is generally detectable in upper and lower  respiratory specimens dur ing the acute phase of infection.  Positive  results are indicative of active infection with SARS-CoV-2.  Clinical  correlation with patient history and other diagnostic information is  necessary to determine patient infection status.  Positive  results do  not rule out bacterial infection or co-infection with other viruses. If result is PRESUMPTIVE POSTIVE SARS-CoV-2 nucleic acids MAY BE PRESENT.   A presumptive positive result was obtained on the submitted specimen  and confirmed on repeat testing.  While 2019 novel coronavirus  (SARS-CoV-2) nucleic acids may be present in the submitted sample  additional confirmatory testing may be necessary for epidemiological  and / or clinical management purposes  to differentiate between  SARS-CoV-2 and other Sarbecovirus currently known to infect humans.  If clinically indicated additional testing with an alternate test  methodology [phone]([id number]) is advised. The SARS-CoV-2 RNA is generally  detectable in upper and lower respiratory sp ecimens during the acute  phase of infection. The expected result is Negative. Fact Sheet for Patients:  BoilerBrush.com.cyhttps://www.fda.gov/media/136312/download Fact Sheet for Healthcare Providers: https://pope.com/https://www.fda.gov/media/136313/download This test is not yet approved or cleared by the [place] FDA and has been authorized for detection and/or diagnosis of SARS-CoV-2 by FDA under an Emergency Use Authorization (EUA).  This EUA will remain in effect (meaning this test can be used) for the duration of the COVID-19 declaration under Section 564(b)(1) of the Act, 21 U.S.C. section 360bbb-3(b)(1), unless the authorization is terminated or revoked sooner. Performed at [facility], [address] [place], [place] [address]   Respiratory Panel by PCR     Status: None   Collection Time: 03/07/19  8:53 AM   Specimen: Nasopharyngeal Swab; Respiratory  Result Value Ref Range Status   Adenovirus NOT DETECTED NOT DETECTED Final   Coronavirus 229E NOT DETECTED NOT DETECTED Final    Comment: (NOTE) The Coronavirus on the Respiratory Panel, DOES NOT test for the novel  Coronavirus (2019 nCoV)    Coronavirus HKU1 NOT DETECTED NOT DETECTED Final   Coronavirus NL63  NOT DETECTED NOT DETECTED Final   Coronavirus OC43 NOT DETECTED NOT DETECTED Final   Metapneumovirus NOT DETECTED NOT DETECTED Final   Rhinovirus / Enterovirus NOT DETECTED NOT DETECTED Final   Influenza A NOT DETECTED NOT DETECTED Final   Influenza B NOT DETECTED NOT DETECTED Final  Parainfluenza Virus 1 NOT DETECTED NOT DETECTED Final   Parainfluenza Virus 2 NOT DETECTED NOT DETECTED Final   Parainfluenza Virus 3 NOT DETECTED NOT DETECTED Final   Parainfluenza Virus 4 NOT DETECTED NOT DETECTED Final   Respiratory Syncytial Virus NOT DETECTED NOT DETECTED Final   Bordetella pertussis NOT DETECTED NOT DETECTED Final   Chlamydophila pneumoniae NOT DETECTED NOT DETECTED Final   Mycoplasma pneumoniae NOT DETECTED NOT DETECTED Final    Comment: Performed at [facility], [address] [place], [place] [address]    RADIOLOGY:  No results found.   Management plans discussed with the patient, family and they are in agreement.  CODE STATUS: Full Code   TOTAL TIME TAKING CARE OF THIS PATIENT: 45 minutes.    Jinny Blossom Keah Lamba M.D on 03/08/2019 at 9:45 AM  Between 7am to 6pm - Pager [phone]  After 6pm go to [email or web address] - Scientist, research (life sciences) [facility]  Office  [phone]  CC: Primary care physician; Lauro Regulus, MD   Note: This dictation was prepared with Dragon dictation along with smaller phrase technology. Any transcriptional errors that result from this process are unintentional.

## 2019-03-08 NOTE — Plan of Care (Signed)
  Problem: Education: Goal: Knowledge of disease or condition will improve Outcome: Progressing   Problem: Self-Care: Goal: Ability to participate in self-care as condition permits will improve Outcome: Progressing Goal: Ability to communicate needs accurately will improve Outcome: Progressing   Problem: Ischemic Stroke/TIA Tissue Perfusion: Goal: Complications of ischemic stroke/TIA will be minimized Outcome: Progressing   Problem: Health Behavior/Discharge Planning: Goal: Ability to manage health-related needs will improve Outcome: Progressing   Problem: Activity: Goal: Risk for activity intolerance will decrease Outcome: Progressing   Problem: Activity: Goal: Risk for activity intolerance will decrease Outcome: Progressing   Problem: Nutrition: Goal: Adequate nutrition will be maintained Outcome: Progressing   Problem: Pain Managment: Goal: General experience of comfort will improve Outcome: Progressing   Problem: Safety: Goal: Ability to remain free from injury will improve Outcome: Progressing   Problem: Safety: Goal: Ability to remain free from injury will improve Outcome: Progressing

## 2019-03-29 ENCOUNTER — Other Ambulatory Visit: Payer: Self-pay | Admitting: Adult Health

## 2019-03-29 DIAGNOSIS — K219 Gastro-esophageal reflux disease without esophagitis: Secondary | ICD-10-CM

## 2019-05-30 ENCOUNTER — Emergency Department
Admission: EM | Admit: 2019-05-30 | Discharge: 2019-05-30 | Disposition: A | Discharge status: Home / Self Care | Payer: Medicare Other | Attending: Emergency Medicine | Admitting: Emergency Medicine

## 2019-05-30 ENCOUNTER — Emergency Department: Payer: Medicare Other

## 2019-05-30 ENCOUNTER — Other Ambulatory Visit: Payer: Self-pay

## 2019-05-30 ENCOUNTER — Encounter: Payer: Self-pay | Admitting: Emergency Medicine

## 2019-05-30 DIAGNOSIS — I10 Essential (primary) hypertension: Secondary | ICD-10-CM | POA: Insufficient documentation

## 2019-05-30 DIAGNOSIS — Z794 Long term (current) use of insulin: Secondary | ICD-10-CM | POA: Insufficient documentation

## 2019-05-30 DIAGNOSIS — Z7982 Long term (current) use of aspirin: Secondary | ICD-10-CM | POA: Diagnosis not present

## 2019-05-30 DIAGNOSIS — J441 Chronic obstructive pulmonary disease with (acute) exacerbation: Secondary | ICD-10-CM

## 2019-05-30 DIAGNOSIS — R0789 Other chest pain: Secondary | ICD-10-CM | POA: Diagnosis not present

## 2019-05-30 DIAGNOSIS — I4891 Unspecified atrial fibrillation: Secondary | ICD-10-CM | POA: Diagnosis not present

## 2019-05-30 DIAGNOSIS — J449 Chronic obstructive pulmonary disease, unspecified: Secondary | ICD-10-CM | POA: Insufficient documentation

## 2019-05-30 DIAGNOSIS — E039 Hypothyroidism, unspecified: Secondary | ICD-10-CM | POA: Insufficient documentation

## 2019-05-30 DIAGNOSIS — E119 Type 2 diabetes mellitus without complications: Secondary | ICD-10-CM | POA: Insufficient documentation

## 2019-05-30 DIAGNOSIS — Z8673 Personal history of transient ischemic attack (TIA), and cerebral infarction without residual deficits: Secondary | ICD-10-CM | POA: Diagnosis not present

## 2019-05-30 DIAGNOSIS — Z9981 Dependence on supplemental oxygen: Secondary | ICD-10-CM | POA: Diagnosis not present

## 2019-05-30 DIAGNOSIS — Z87891 Personal history of nicotine dependence: Secondary | ICD-10-CM | POA: Insufficient documentation

## 2019-05-30 DIAGNOSIS — Z79899 Other long term (current) drug therapy: Secondary | ICD-10-CM | POA: Insufficient documentation

## 2019-05-30 DIAGNOSIS — R079 Chest pain, unspecified: Secondary | ICD-10-CM | POA: Diagnosis present

## 2019-05-30 LAB — BASIC METABOLIC PANEL
Anion gap: 11 (ref 5–15)
BUN: 16 mg/dL (ref 8–23)
CO2: 23 mmol/L (ref 22–32)
Calcium: 9.3 mg/dL (ref 8.9–10.3)
Chloride: 105 mmol/L (ref 98–111)
Creatinine, Ser: 0.83 mg/dL (ref 0.44–1.00)
GFR calc Af Amer: >60 mL/min (ref >60)
GFR calc non Af Amer: >60 mL/min (ref >60)
Glucose, Bld: 214 mg/dL — ABNORMAL HIGH (ref 70–99)
Potassium: 4.1 mmol/L (ref 3.5–5.1)
Sodium: 139 mmol/L (ref 135–145)

## 2019-05-30 LAB — CBC
HCT: 45.8 % (ref 36.0–46.0)
Hemoglobin: 14.1 g/dL (ref 12.0–15.0)
MCH: 25.4 pg — ABNORMAL LOW (ref 26.0–34.0)
MCHC: 30.8 g/dL (ref 30.0–36.0)
MCV: 82.4 fL (ref 80.0–100.0)
Platelets: 286 10*3/uL (ref 150–400)
RBC: 5.56 MIL/uL — ABNORMAL HIGH (ref 3.87–5.11)
RDW: 15.5 % (ref 11.5–15.5)
WBC: 9.5 10*3/uL (ref 4.0–10.5)
nRBC: 0 % (ref 0.0–0.2)

## 2019-05-30 LAB — TROPONIN I (HIGH SENSITIVITY)
Troponin I (High Sensitivity): 8 ng/L (ref <18)
Troponin I (High Sensitivity): 7 ng/L (ref <18)

## 2019-05-30 MED ORDER — AZITHROMYCIN 250 MG PO TABS: ORAL_TABLET | ORAL | 0 refills | Status: AC

## 2019-05-30 MED ORDER — BENZONATATE 100 MG PO CAPS: 100.0000 mg | ORAL_CAPSULE | Freq: Four times a day (QID) | ORAL | 0 refills | Status: DC | PRN

## 2019-05-30 NOTE — ED Triage Notes (Signed)
Pt reports this am she woke up with a heaviness in her chest and her blood pressure was elevated. Has COPD so reports is always SOB but it has increased a little.

## 2019-05-30 NOTE — ED Provider Notes (Signed)
[facility] Emergency Department Provider Note  ____________________________________________   None    (approximate)   I have reviewed the triage vital signs and the nursing notes.   Patient has been triaged with a MSE exam performed by myself at a minimum. Based on symptoms and screening exam, patient may receive a more in-depth exam, labs, imaging as detailed below. Patients have been advised of this setting and exam type at the time of patient interview.    HISTORY  Chief Complaint Chest Pain and Hypertension    HPI Peggy [name] is a 85 y.o. female that presents to the emergency department with a complaint of chest pain, shortness of breath, cough, hypertension this morning.  Patient states that she had a heaviness in her chest that woke her up about 4 AM this morning.  She has had a cough for 2 days and coughed up just a little bit yesterday.  She has shortness of breath at baseline but felt it is a little worse today.  She checked her blood pressure this morning and it was 202, then 210, then 199. She is not sure if her cuff is correct though. She does take blood pressure medications but not cannot recall the name.  She did not take her medication this morning.  She was sick about a week ago with vomiting, abdominal pain, diarrhea but got over this.  No recent fevers.  Patient will receive a medical screening exam as detailed below.  Based off of this exam, more in depth exam, labs, imaging will be performed as needed for complaint.  Patient care will be eventually transferred to another provider in the emergency department for final exam, diagnosis and disposition.    Past Medical History:  Diagnosis Date  . A-fib (HCC)   . Allergy   . Arthritis   . COPD (chronic obstructive pulmonary disease) (HCC)   . Diabetes mellitus without complication (HCC)   . GERD (gastroesophageal reflux disease)   . Hyperlipidemia   . Hypertension   . Oxygen dependent      2 liters  . Stroke [facility])    5 years ago per daughter  . TIA (transient ischemic attack)     Patient Active Problem List   Diagnosis Date Noted  . Uncontrolled hypertension 11/22/2018  . Chronic respiratory failure with hypoxia (HCC) 10/08/2017  . Simple chronic bronchitis (HCC) 10/08/2017  . SOB (shortness of breath) 09/24/2017  . History of stroke 08/17/2017  . Acute respiratory failure with hypoxia and hypercarbia (HCC) 08/07/2017  . Respiratory failure (HCC) 08/07/2017  . History of GI bleed 07/07/2017  . Atrial fibrillation with RVR (HCC) 06/30/2017  . Bilateral carotid artery disease (HCC) 04/14/2017  . Healthcare maintenance 04/14/2017  . Dizziness 04/07/2017  . 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 TIA (transient ischemic attack) 12/10/2016  . CVA (cerebral vascular accident) (HCC) 12/09/2016  . Bilateral arm pain 11/27/2016  . Myocardial infarction (HCC) 07/30/2015  . Urinary frequency 02/09/2015  . RUQ abdominal pain 01/26/2015  . Shortness of breath 01/26/2015  . Anxiety 01/26/2015  . Chronic pruritus 12/21/2014  . Cerebral vascular accident (HCC) 11/16/2014  . Chronic low back pain 11/16/2014  . Adult hypothyroidism 11/15/2014  . Allergic rhinitis 11/15/2014  . Body mass index (BMI) of 28.0-28.9 in adult 11/15/2014  . Arthritis 11/15/2014  . Cramps of lower extremity 11/15/2014  . Essential (primary) hypertension 11/15/2014  . Acid reflux 11/15/2014  . Hypercholesteremia 11/15/2014  .  Cannot sleep 11/15/2014  . Psoriasis 11/15/2014  . Itch of skin 11/15/2014  . Restless leg 11/15/2014  . Diabetes mellitus, type 2 (HCC) 11/15/2014  . Breath shortness 11/15/2014  . Dermatitis due to unknown cause 04/10/2014  . Arteriosclerosis of coronary artery 02/20/2014  . AF (paroxysmal atrial fibrillation) (HCC) 02/20/2014  . Temporary cerebral vascular dysfunction 02/20/2014  . DD (diverticular disease) 10/05/2013    Past Surgical History:   Procedure Laterality Date  . ABDOMINAL HYSTERECTOMY  1970  . BACK SURGERY  2013  . colectomy Right 10/08/2013   Dr. Egbert Garibaldi  . CORONARY ANGIOPLASTY WITH STENT PLACEMENT      Prior to Admission medications   Medication Sig Start Date End Date Taking? Authorizing Provider  aspirin EC 81 MG EC tablet Take 1 tablet (81 mg total) by mouth daily. 03/09/19   Mayo, Allyn Kenner, MD  atorvastatin (LIPITOR) 40 MG tablet Take 1 tablet (40 mg total) by mouth daily at 6 PM. 03/08/19   Mayo, Allyn Kenner, MD  budesonide-formoterol [facility]) 80-4.5 MCG/ACT inhaler Inhale 1 puff into the lungs 2 (two) times daily. 12/31/18   [name], NP  Cholecalciferol (VITAMIN D3) 5000 units TABS Take 5,000 Units by mouth daily.    [provider]  ferrous sulfate 324 (65 Fe) MG TBEC Take 1 tablet (324 mg total) by mouth daily. 03/08/19   Mayo, Allyn Kenner, MD  gabapentin (NEURONTIN) 100 MG capsule Take 100 mg by mouth at bedtime.    [provider]  guaiFENesin (MUCINEX) 600 MG 12 hr tablet Take by mouth 2 (two) times daily.    [provider]  guaiFENesin-codeine (ROBITUSSIN AC) 100-10 MG/5ML syrup Take 5 mLs by mouth 3 (three) times daily as needed for cough. 12/21/18   Scarboro, Coralee North, NP  HUMULIN 70/30 KWIKPEN (70-30) 100 UNIT/ML PEN Inject 20 Units into the skin 2 (two) times daily. Patient taking differently: Inject 35 Units into the skin 2 (two) times daily.  09/26/17   Enedina Finner, MD  HYDROcodone-acetaminophen (NORCO/VICODIN) 5-325 MG tablet Take 1 tablet by mouth 2 (two) times daily. As needed for chronic low back pain 07/18/15   Lorie Phenix, MD  hydrOXYzine (ATARAX/VISTARIL) 25 MG tablet Take 25 mg by mouth 4 (four) times daily as needed for itching. 06/05/17   [provider]  lactobacillus acidophilus (BACID) TABS tablet Take 2 tablets by mouth daily.    [provider]  levothyroxine (SYNTHROID, LEVOTHROID) 50 MCG tablet Take 1 tablet (50 mcg total) by mouth daily.  PATIENT NEEDS TO SCHEDULE OFFICE VISIT FOR FOLLOW UP 02/01/16   Malva Limes, MD  meclizine (ANTIVERT) 12.5 MG tablet Take 1 tablet (12.5 mg total) by mouth 2 (two) times daily. 11/24/18   Jimmye Norman, NP  metoprolol succinate (TOPROL-XL) 100 MG 24 hr tablet Take 1 tablet (100 mg total) by mouth daily. Take with or immediately following a meal. 11/25/18   Jimmye Norman, NP  OXYGEN Inhale 2 L into the lungs.    [provider]  pantoprazole (PROTONIX) 40 MG tablet TAKE 1 TABLET(40 MG) BY MOUTH DAILY 03/29/19   Scarboro, Coralee North, NP  VENTOLIN HFA 108 (90 Base) MCG/ACT inhaler Inhale 2 puffs into the lungs every 6 (six) hours as needed. 05/24/17   [provider]  vitamin B-12 (CYANOCOBALAMIN) 1000 MCG tablet Take 1,000 mcg by mouth daily.    [provider]    Allergies Diphenhydramine, Metformin, Oxybutynin, Amlodipine, Ciprofloxacin, Lisinopril, Penicillins, Saxagliptin, and Sulfamethoxazole-trimethoprim  Family History  Problem Relation Age of Onset  . Breast cancer Mother   . Pancreatitis Father   . Breast cancer Sister   . Lung cancer Brother   . Melanoma Brother   . Throat cancer Brother     Social History Social History   Tobacco Use  . Smoking status: Former Smoker    Packs/day: 1.00    Years: 30.00    Pack years: 30.00    Quit date: 11/30/1999    Years since quitting: 19.5  . Smokeless tobacco: Never Used  Substance Use Topics  . Alcohol use: No  . Drug use: No    Review of Systems Constitutional: No fever Cardiovascular: Positive for chest pain. Respiratory: Intermittent cough. Positive for increased shortness of breath/difficulty breathing Gastroenterology: No abdominal pain Musculoskeletal: Negative for musculoskeletal pain Integumentary: Negative for rash. Neurological: No focal weakness nor numbness.   ____________________________________________   PHYSICAL EXAM:  VITAL SIGNS: ED Triage Vitals  Enc  Vitals Group     BP 05/30/19 1022 (!) 179/63     Pulse Rate 05/30/19 1022 72     Resp 05/30/19 1022 20     Temp 05/30/19 1022 97.7 F (36.5 C)     Temp Source 05/30/19 1022 Oral     SpO2 05/30/19 1022 98 %     Weight 05/30/19 1025 169 lb (76.7 kg)     Height 05/30/19 1025 5\' 2"  (1.575 m)     Head Circumference --      Peak Flow --      Pain Score 05/30/19 1023 5     Pain Loc --      Pain Edu? --      Excl. in [place]? --     Constitutional: Alert and oriented. Generally well appearing and in no acute distress. Eyes: Conjunctivae are normal.  Nose: No significant congestion/rhinnorhea. Mouth: No gross oropharyngeal edema.  Neck: No stridor.  No meningeal signs.   Cardiovascular: Grossly normal heart sounds. Respiratory: Normal respiratory effort without significant tachypnea and no observed retractions.  Gastrointestinal: No significant visible abdominal wall findings.  Bowel sounds x4 quadrants.  Musculoskeletal: No gross deformities of extremities. Neurologic:  Normal speech and language. No gross focal neurologic deficits are appreciated.  Skin:  Skin is warm, dry and intact. No rash noted.    ____________________________________________   LABS (all labs ordered are listed, but only abnormal results are displayed)  Labs Reviewed  BASIC METABOLIC PANEL - Abnormal; Notable for the following components:      Result Value   Glucose, Bld 214 (*)    All other components within normal limits  CBC - Abnormal; Notable for the following components:   RBC 5.56 (*)    MCH 25.4 (*)    All other components within normal limits  TROPONIN I (HIGH SENSITIVITY)  TROPONIN I (HIGH SENSITIVITY)    ____________________________________________   RADIOLOGY Robinette Haines, personally viewed and evaluated these images (plain radiographs) as part of my medical decision making, as well as reviewing the written report by the radiologist.  Official radiology report(s): DG Chest 2  View  Result Date: 05/30/2019 CLINICAL DATA:  Chest pain EXAM: CHEST - 2 VIEW COMPARISON:  03/07/2019 FINDINGS: Mild peribronchial thickening. No confluent opacities. Heart is normal size. No effusions or acute bony abnormality. IMPRESSION: Mild bronchitic changes. Electronically Signed   By: Rolm Baptise M.D.   On: 05/30/2019 11:17    ____________________________________________    INITIAL IMPRESSION / MDM / ASSESSMENT AND PLAN /  ED COURSE      Labwork, EKG, Chest xray in progress.     Patient has been screened based based on their arrival complaint, evaluated for an emergent condition, and at a minimum has received a medical screening exam.  At this time, patient will receive further work-up as determined by medical screening exam.  Patient care will eventually be transferred to another provider in the emergency department for final diagnosis and disposition.    ____________________________________________  Note:  This document was prepared using Conservation officer, historic buildingsDragon voice recognition software and may include unintentional dictation errors.   [place] DerryWagner, Annice Jolly, PA-C 05/30/19 1349    Jene EveryKinner, Robert, MD 05/30/19 1450

## 2019-05-30 NOTE — ED Triage Notes (Signed)
Pt daughter contact [phone]

## 2019-05-30 NOTE — ED Notes (Addendum)
Pt wheeled to lobby with oxygen tank in use. Pt has mask on properly currently. Daughter Sharyn Lull on way to pick pt up.

## 2019-05-30 NOTE — ED Provider Notes (Signed)
[facility] Emergency Department Provider Note   ____________________________________________    I have reviewed the triage vital signs and the nursing notes.   HISTORY  Chief Complaint Chest Pain and Hypertension     HPI Peggy [name] is a 85 y.o. female with a history of COPD, A. fib, diabetes, hypertension and hyperlipidemia on oxygen who presents with complaints of chest discomfort.  Patient reports he woke up this morning feeling tightness in her chest.  She reports she has had a mild cough recently and her breathing has been worse than typical.  She is on oxygen 2 L constantly for COPD.  She believes that she is having a mild COPD exacerbation.  She does not like to take steroids because it has significantly impacted her glucose in the past.  Currently she is feeling well, no chest pain Past Medical History:  Diagnosis Date  . A-fib ([place])   . Allergy   . Arthritis   . COPD (chronic obstructive pulmonary disease) ([place])   . Diabetes mellitus without complication ([place])   . GERD (gastroesophageal reflux disease)   . Hyperlipidemia   . Hypertension   . Oxygen dependent    2 liters  . Stroke [facility])    5 years ago per daughter  . TIA (transient ischemic attack)     Patient Active Problem List   Diagnosis Date Noted  . Uncontrolled hypertension 11/22/2018  . Chronic respiratory failure with hypoxia ([place]) 10/08/2017  . Simple chronic bronchitis ([place]) 10/08/2017  . SOB (shortness of breath) 09/24/2017  . History of stroke 08/17/2017  . Acute respiratory failure with hypoxia and hypercarbia ([place]) 08/07/2017  . Respiratory failure ([place]) 08/07/2017  . History of GI bleed 07/07/2017  . Atrial fibrillation with RVR ([place]) 06/30/2017  . Bilateral carotid artery disease ([place]) 04/14/2017  . Healthcare maintenance 04/14/2017  . Dizziness 04/07/2017  . 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 TIA (transient ischemic attack) 12/10/2016   . CVA (cerebral vascular accident) ([place]) 12/09/2016  . Bilateral arm pain 11/27/2016  . Myocardial infarction ([place]) 07/30/2015  . Urinary frequency 02/09/2015  . RUQ abdominal pain 01/26/2015  . Shortness of breath 01/26/2015  . Anxiety 01/26/2015  . Chronic pruritus 12/21/2014  . Cerebral vascular accident ([place]) 11/16/2014  . Chronic low back pain 11/16/2014  . Adult hypothyroidism 11/15/2014  . Allergic rhinitis 11/15/2014  . Body mass index (BMI) of 28.0-28.9 in adult 11/15/2014  . Arthritis 11/15/2014  . Cramps of lower extremity 11/15/2014  . Essential (primary) hypertension 11/15/2014  . Acid reflux 11/15/2014  . Hypercholesteremia 11/15/2014  . Cannot sleep 11/15/2014  . Psoriasis 11/15/2014  . Itch of skin 11/15/2014  . Restless leg 11/15/2014  . Diabetes mellitus, type 2 ([place]) 11/15/2014  . Breath shortness 11/15/2014  . Dermatitis due to unknown cause 04/10/2014  . Arteriosclerosis of coronary artery 02/20/2014  . AF (paroxysmal atrial fibrillation) ([place]) 02/20/2014  . Temporary cerebral vascular dysfunction 02/20/2014  . DD (diverticular disease) 10/05/2013    Past Surgical History:  Procedure Laterality Date  . ABDOMINAL HYSTERECTOMY  1970  . BACK SURGERY  2013  . colectomy Right 10/08/2013   Dr. Marina Gravel  . CORONARY ANGIOPLASTY WITH STENT PLACEMENT      Prior to Admission medications   Medication Sig Start Date End Date Taking? Authorizing Provider  aspirin EC 81 MG EC tablet Take 1 tablet (81 mg total) by mouth daily. 03/09/19   Mayo, Pete Pelt, MD  atorvastatin (LIPITOR) 40 MG tablet Take 1 tablet (40 mg total) by mouth daily at 6 PM. 03/08/19   Mayo, Allyn Kenner, MD  azithromycin (ZITHROMAX Z-PAK) 250 MG tablet Take 2 tablets (500 mg) on  Day 1,  followed by 1 tablet (250 mg) once daily on Days 2 through 5. 05/30/19 06/04/19  Jene Every, MD  benzonatate (TESSALON PERLES) 100 MG capsule Take 1 capsule (100 mg total) by mouth every 6 (six) hours as needed for  cough. 05/30/19 05/29/20  Jene Every, MD  budesonide-formoterol (SYMBICORT) 80-4.5 MCG/ACT inhaler Inhale 1 puff into the lungs 2 (two) times daily. 12/31/18   [name], NP  Cholecalciferol (VITAMIN D3) 5000 units TABS Take 5,000 Units by mouth daily.    [provider]  ferrous sulfate 324 (65 Fe) MG TBEC Take 1 tablet (324 mg total) by mouth daily. 03/08/19   Mayo, Allyn Kenner, MD  gabapentin (NEURONTIN) 100 MG capsule Take 100 mg by mouth at bedtime.    [provider]  guaiFENesin (MUCINEX) 600 MG 12 hr tablet Take by mouth 2 (two) times daily.    [provider]  guaiFENesin-codeine (ROBITUSSIN AC) 100-10 MG/5ML syrup Take 5 mLs by mouth 3 (three) times daily as needed for cough. 12/21/18   Scarboro, Coralee North, NP  HUMULIN 70/30 KWIKPEN (70-30) 100 UNIT/ML PEN Inject 20 Units into the skin 2 (two) times daily. Patient taking differently: Inject 35 Units into the skin 2 (two) times daily.  09/26/17   Enedina Finner, MD  HYDROcodone-acetaminophen (NORCO/VICODIN) 5-325 MG tablet Take 1 tablet by mouth 2 (two) times daily. As needed for chronic low back pain 07/18/15   Lorie Phenix, MD  hydrOXYzine (ATARAX/VISTARIL) 25 MG tablet Take 25 mg by mouth 4 (four) times daily as needed for itching. 06/05/17   [provider]  lactobacillus acidophilus (BACID) TABS tablet Take 2 tablets by mouth daily.    [provider]  levothyroxine (SYNTHROID, LEVOTHROID) 50 MCG tablet Take 1 tablet (50 mcg total) by mouth daily. PATIENT NEEDS TO SCHEDULE OFFICE VISIT FOR FOLLOW UP 02/01/16   Malva Limes, MD  meclizine (ANTIVERT) 12.5 MG tablet Take 1 tablet (12.5 mg total) by mouth 2 (two) times daily. 11/24/18   Jimmye Norman, NP  metoprolol succinate (TOPROL-XL) 100 MG 24 hr tablet Take 1 tablet (100 mg total) by mouth daily. Take with or immediately following a meal. 11/25/18   Jimmye Norman, NP  OXYGEN Inhale 2 L into the lungs.    [provider]  pantoprazole (PROTONIX) 40 MG tablet TAKE 1 TABLET(40 MG) BY MOUTH DAILY 03/29/19   Scarboro, Coralee North, NP  VENTOLIN HFA 108 (90 Base) MCG/ACT inhaler Inhale 2 puffs into the lungs every 6 (six) hours as needed. 05/24/17   [provider]  vitamin B-12 (CYANOCOBALAMIN) 1000 MCG tablet Take 1,000 mcg by mouth daily.    [provider]     Allergies Diphenhydramine, Metformin, Oxybutynin, Amlodipine, Ciprofloxacin, Lisinopril, Penicillins, Saxagliptin, and Sulfamethoxazole-trimethoprim  Family History  Problem Relation Age of Onset  . Breast cancer Mother   . Pancreatitis Father   . Breast cancer Sister   . Lung cancer Brother   . Melanoma Brother   . Throat cancer Brother     Social History Social History   Tobacco Use  . Smoking status: Former Smoker    Packs/day: 1.00    Years: 30.00    Pack years: 30.00    Quit date: 11/30/1999  Years since quitting: 19.5  . Smokeless tobacco: Never Used  Substance Use Topics  . Alcohol use: No  . Drug use: No    Review of Systems  Constitutional: No fever/chills Eyes: No visual changes.  ENT: No sore throat. Cardiovascular: As above Respiratory: Mild shortness of breath, chronic, slightly worse than usual Gastrointestinal: No abdominal pain. Genitourinary: Negative for dysuria. Musculoskeletal: Negative for back pain. Skin: Negative for rash. Neurological: Negative for headaches    ____________________________________________   PHYSICAL EXAM:  VITAL SIGNS: ED Triage Vitals  Enc Vitals Group     BP 05/30/19 1022 (!) 179/63     Pulse Rate 05/30/19 1022 72     Resp 05/30/19 1022 20     Temp 05/30/19 1022 97.7 F (36.5 C)     Temp Source 05/30/19 1022 Oral     SpO2 05/30/19 1022 98 %     Weight 05/30/19 1025 76.7 kg (169 lb)     Height 05/30/19 1025 1.575 m (5\' 2" )     Head Circumference --      Peak Flow --      Pain Score 05/30/19 1023 5     Pain Loc --      Pain Edu? --       Excl. in [place]? --     Constitutional: Alert and oriented.   Nose: No congestion/rhinnorhea. Mouth/Throat: Mucous membranes are moist.    Cardiovascular: Normal rate, regular rhythm. Grossly normal heart sounds.  Good peripheral circulation. Respiratory: Normal respiratory effort.  No retractions.  Scattered wheezes Gastrointestinal: Soft and nontender. No distention.  No CVA tenderness.  Musculoskeletal: No lower extremity tenderness nor edema.  Warm and well perfused Neurologic:  Normal speech and language. No gross focal neurologic deficits are appreciated.  Skin:  Skin is warm, dry and intact. No rash noted. Psychiatric: Mood and affect are normal. Speech and behavior are normal.  ____________________________________________   LABS (all labs ordered are listed, but only abnormal results are displayed)  Labs Reviewed  BASIC METABOLIC PANEL - Abnormal; Notable for the following components:      Result Value   Glucose, Bld 214 (*)    All other components within normal limits  CBC - Abnormal; Notable for the following components:   RBC 5.56 (*)    MCH 25.4 (*)    All other components within normal limits  TROPONIN I (HIGH SENSITIVITY)  TROPONIN I (HIGH SENSITIVITY)   ____________________________________________  EKG  ED ECG REPORT I, Jene Everyobert Mairead Schwarzkopf, the attending physician, personally viewed and interpreted this ECG.  Date: 05/30/2019  Rhythm: normal sinus rhythm QRS Axis: normal Intervals: normal ST/T Wave abnormalities: Nonspecific changes Narrative Interpretation: no evidence of acute ischemia  ____________________________________________  RADIOLOGY  Chest x-ray unremarkable ____________________________________________   PROCEDURES  Procedure(s) performed: No  Procedures   Critical Care performed: No ____________________________________________   INITIAL IMPRESSION / ASSESSMENT AND PLAN / ED COURSE  Pertinent labs & imaging results that were  available during my care of the patient were reviewed by me and considered in my medical decision making (see chart for details).  Patient overall well-appearing and in no acute distress, suspect chest discomfort is related to mild COPD exacerbation, patient does not want to use steroids given her history of hyperglycemia in the past related to prednisone use.  She will go to every 4 to 6-hour nebulizers.  Troponin is negative x2, not consistent with ACS PE or dissection.  Outpatient follow-up, return precautions discussed, patient agrees with this plan  ____________________________________________   FINAL CLINICAL IMPRESSION(S) / ED DIAGNOSES  Final diagnoses:  Atypical chest pain  COPD exacerbation (HCC)        Note:  This document was prepared using Dragon voice recognition software and may include unintentional dictation errors.   Jene Every, MD 05/30/19 1505

## 2019-05-30 NOTE — ED Notes (Signed)
Pt alert; pt states CP has dec since arrival. States "it hurts when I cough". Pt laying in bed calmly; in NAD. Pt remains on home O2 at 2L.

## 2019-06-06 ENCOUNTER — Ambulatory Visit: Payer: Medicare Other | Admitting: Internal Medicine

## 2019-06-06 ENCOUNTER — Other Ambulatory Visit: Payer: Self-pay

## 2019-06-06 ENCOUNTER — Ambulatory Visit
Admission: RE | Admit: 2019-06-06 | Discharge: 2019-06-06 | Disposition: A | Discharge status: Home / Self Care | Payer: Medicare Other | Source: Ambulatory Visit | Attending: Internal Medicine | Admitting: Internal Medicine

## 2019-06-06 ENCOUNTER — Ambulatory Visit
Admission: RE | Admit: 2019-06-06 | Discharge: 2019-06-06 | Disposition: A | Discharge status: Home / Self Care | Payer: Medicare Other | Attending: Internal Medicine | Admitting: Internal Medicine

## 2019-06-06 ENCOUNTER — Encounter: Payer: Self-pay | Admitting: Internal Medicine

## 2019-06-06 VITALS — BP 141/99 | HR 104 | Temp 97.0°F | Resp 16 | Ht 62.0 in | Wt 167.2 lb

## 2019-06-06 DIAGNOSIS — R0602 Shortness of breath: Secondary | ICD-10-CM

## 2019-06-06 DIAGNOSIS — I482 Chronic atrial fibrillation, unspecified: Secondary | ICD-10-CM | POA: Diagnosis not present

## 2019-06-06 DIAGNOSIS — R05 Cough: Secondary | ICD-10-CM

## 2019-06-06 DIAGNOSIS — J449 Chronic obstructive pulmonary disease, unspecified: Secondary | ICD-10-CM | POA: Diagnosis not present

## 2019-06-06 DIAGNOSIS — R059 Cough, unspecified: Secondary | ICD-10-CM

## 2019-06-06 MED ORDER — PREDNISONE 10 MG PO TABS: 10.0000 mg | ORAL_TABLET | Freq: Every day | ORAL | 0 refills | Status: DC

## 2019-06-06 MED ORDER — HYDROCOD POLST-CPM POLST ER 10-8 MG/5ML PO SUER: 5.0000 mL | Freq: Every day | ORAL | 0 refills | Status: DC

## 2019-06-06 MED ORDER — DOXYCYCLINE HYCLATE 100 MG PO TABS: 100.0000 mg | ORAL_TABLET | Freq: Two times a day (BID) | ORAL | 0 refills | Status: AC

## 2019-06-06 NOTE — Progress Notes (Signed)
[facility] [address] [place], [place] [address]  Pulmonary Sleep Medicine   Office Visit Note  Patient Name: Peggy [name] DOB: 1929-02-05 MRN [id number]  Date of Service: 06/06/2019  Complaints/HPI: cough SOB patient had been seen in the emergency department apparently presented with shortness of breath cough and congestion.  Patient had chest x-ray done which showed chronic bronchitis.  She was given a prescription for azithromycin and was also given some Tessalon Perles for her cough.  She states that her symptoms have minimally improved she still has a cough that sounds wet.  She states that she is not really bringing up much in the way of sputum.  She does have some shortness of breath noted.  She denies having any chest pain although when she presented to the emergency department she had been complaining about having chest pain.  No fever no chills are noted at this time she denies any tightness.  No swelling of her legs.  She states there is no blood in the sputum  ROS  General: (-) fever, (-) chills, (-) night sweats, (-) weakness Skin: (-) rashes, (-) itching,. Eyes: (-) visual changes, (-) redness, (-) itching. Nose and Sinuses: (-) nasal stuffiness or itchiness, (-) postnasal drip, (-) nosebleeds, (-) sinus trouble. Mouth and Throat: (-) sore throat, (-) hoarseness. Neck: (-) swollen glands, (-) enlarged thyroid, (-) neck pain. Respiratory: + cough, (-) bloody sputum, + shortness of breath, - wheezing. Cardiovascular: - ankle swelling, (-) chest pain. Lymphatic: (-) lymph node enlargement. Neurologic: (-) numbness, (-) tingling. Psychiatric: (-) anxiety, (-) depression   Current Medication: Outpatient Encounter Medications as of 06/06/2019  Medication Sig  . aspirin EC 81 MG EC tablet Take 1 tablet (81 mg total) by mouth daily.  . benzonatate (TESSALON PERLES) 100 MG capsule Take 1 capsule (100 mg total) by mouth every 6 (six) hours as needed for cough.  .  budesonide-formoterol (SYMBICORT) 80-4.5 MCG/ACT inhaler Inhale 1 puff into the lungs 2 (two) times daily.  . Cholecalciferol (VITAMIN D3) 5000 units TABS Take 5,000 Units by mouth daily.  Marland Kitchen guaiFENesin (MUCINEX) 600 MG 12 hr tablet Take by mouth 2 (two) times daily.  Marland Kitchen guaiFENesin-codeine (ROBITUSSIN AC) 100-10 MG/5ML syrup Take 5 mLs by mouth 3 (three) times daily as needed for cough.  Marland Kitchen HUMULIN 70/30 KWIKPEN (70-30) 100 UNIT/ML PEN Inject 20 Units into the skin 2 (two) times daily. (Patient taking differently: Inject 35 Units into the skin 2 (two) times daily. )  . hydrOXYzine (ATARAX/VISTARIL) 25 MG tablet Take 25 mg by mouth 4 (four) times daily as needed for itching.  . lactobacillus acidophilus (BACID) TABS tablet Take 2 tablets by mouth daily.  Marland Kitchen levothyroxine (SYNTHROID, LEVOTHROID) 50 MCG tablet Take 1 tablet (50 mcg total) by mouth daily. PATIENT NEEDS TO SCHEDULE OFFICE VISIT FOR FOLLOW UP  . metoprolol succinate (TOPROL-XL) 100 MG 24 hr tablet Take 1 tablet (100 mg total) by mouth daily. Take with or immediately following a meal.  . OXYGEN Inhale 2 L into the lungs.  . pantoprazole (PROTONIX) 40 MG tablet TAKE 1 TABLET(40 MG) BY MOUTH DAILY  . VENTOLIN HFA 108 (90 Base) MCG/ACT inhaler Inhale 2 puffs into the lungs every 6 (six) hours as needed.  . vitamin B-12 (CYANOCOBALAMIN) 1000 MCG tablet Take 1,000 mcg by mouth daily.  . [DISCONTINUED] atorvastatin (LIPITOR) 40 MG tablet Take 1 tablet (40 mg total) by mouth daily at 6 PM.  . [DISCONTINUED] ferrous sulfate 324 (65 Fe) MG  TBEC Take 1 tablet (324 mg total) by mouth daily.  . [DISCONTINUED] HYDROcodone-acetaminophen (NORCO/VICODIN) 5-325 MG tablet Take 1 tablet by mouth 2 (two) times daily. As needed for chronic low back pain  . [DISCONTINUED] gabapentin (NEURONTIN) 100 MG capsule Take 100 mg by mouth at bedtime.  . [DISCONTINUED] meclizine (ANTIVERT) 12.5 MG tablet Take 1 tablet (12.5 mg total) by mouth 2 (two) times daily. (Patient  not taking: Reported on 06/06/2019)   No facility-administered encounter medications on file as of 06/06/2019.    Surgical History: Past Surgical History:  Procedure Laterality Date  . ABDOMINAL HYSTERECTOMY  1970  . BACK SURGERY  2013  . colectomy Right 10/08/2013   Dr. Egbert Garibaldi  . CORONARY ANGIOPLASTY WITH STENT PLACEMENT      Medical History: Past Medical History:  Diagnosis Date  . A-fib (HCC)   . Allergy   . Arthritis   . COPD (chronic obstructive pulmonary disease) (HCC)   . Diabetes mellitus without complication (HCC)   . GERD (gastroesophageal reflux disease)   . Hyperlipidemia   . Hypertension   . Oxygen dependent    2 liters  . Stroke [facility])    5 years ago per daughter  . TIA (transient ischemic attack)     Family History: Family History  Problem Relation Age of Onset  . Breast cancer Mother   . Pancreatitis Father   . Breast cancer Sister   . Lung cancer Brother   . Melanoma Brother   . Throat cancer Brother     Social History: Social History   Socioeconomic History  . Marital status: Widowed    Spouse name: Not on file  . Number of children: 3  . Years of education: H/S  . Highest education level: Not on file  Occupational History  . Occupation: Retired  Tobacco Use  . Smoking status: Former Smoker    Packs/day: 1.00    Years: 30.00    Pack years: 30.00    Quit date: 11/30/1999    Years since quitting: 19.5  . Smokeless tobacco: Never Used  Substance and Sexual Activity  . Alcohol use: No  . Drug use: No  . Sexual activity: Not on file  Other Topics Concern  . Not on file  Social History Narrative  . Not on file   Social Determinants of Health   Financial Resource Strain:   . Difficulty of Paying Living Expenses: Not on file  Food Insecurity:   . Worried About Programme researcher, broadcasting/film/video in the Last Year: Not on file  . Ran Out of Food in the Last Year: Not on file  Transportation Needs:   . Lack of Transportation (Medical): Not on file  .  Lack of Transportation (Non-Medical): Not on file  Physical Activity:   . Days of Exercise per Week: Not on file  . Minutes of Exercise per Session: Not on file  Stress:   . Feeling of Stress : Not on file  Social Connections:   . Frequency of Communication with Friends and Family: Not on file  . Frequency of Social Gatherings with Friends and Family: Not on file  . Attends Religious Services: Not on file  . Active Member of Clubs or Organizations: Not on file  . Attends Banker Meetings: Not on file  . Marital Status: Not on file  Intimate Partner Violence:   . Fear of Current or Ex-Partner: Not on file  . Emotionally Abused: Not on file  . Physically Abused: Not  on file  . Sexually Abused: Not on file    Vital Signs: Blood pressure (!) 141/99, pulse (!) 104, temperature (!) 97 F (36.1 C), resp. rate 16, height 5\' 2"  (1.575 m), weight 167 lb 3.2 oz (75.8 kg), SpO2 98 %.  Examination: General Appearance: The patient is well-developed, well-nourished, and in no distress. Skin: Gross inspection of skin unremarkable. Head: normocephalic, no gross deformities. Eyes: no gross deformities noted. ENT: ears appear grossly normal no exudates. Neck: Supple. No thyromegaly. No LAD. Respiratory: few rhojnchi are noted. Cardiovascular: Normal S1 and S2 without murmur or rub. Extremities: No cyanosis. pulses are equal. Neurologic: Alert and oriented. No involuntary movements.  LABS: Recent Results (from the past 2160 hour(s))  Basic metabolic panel     Status: Abnormal   Collection Time: 05/30/19 10:26 AM  Result Value Ref Range   Sodium 139 135 - 145 mmol/L   Potassium 4.1 3.5 - 5.1 mmol/L   Chloride 105 98 - 111 mmol/L   CO2 23 22 - 32 mmol/L   Glucose, Bld 214 (H) 70 - 99 mg/dL   BUN 16 8 - 23 mg/dL   Creatinine, Ser 0.83 0.44 - 1.00 mg/dL   Calcium 9.3 8.9 - 10.3 mg/dL   GFR calc non Af Amer >60 >60 mL/min   GFR calc Af Amer >60 >60 mL/min   Anion gap 11 5 - 15     Comment: Performed at [facility], [address] [place], [place] [address]  CBC     Status: Abnormal   Collection Time: 05/30/19 10:26 AM  Result Value Ref Range   WBC 9.5 4.0 - 10.5 K/uL   RBC 5.56 (H) 3.87 - 5.11 MIL/uL   Hemoglobin 14.1 12.0 - 15.0 g/dL   HCT 45.8 36.0 - 46.0 %   MCV 82.4 80.0 - 100.0 fL   MCH 25.4 (L) 26.0 - 34.0 pg   MCHC 30.8 30.0 - 36.0 g/dL   RDW 15.5 11.5 - 15.5 %   Platelets 286 150 - 400 K/uL   nRBC 0.0 0.0 - 0.2 %    Comment: Performed at [facility], [place], [place] [address]  Troponin I (High Sensitivity)     Status: None   Collection Time: 05/30/19 10:26 AM  Result Value Ref Range   Troponin I (High Sensitivity) 8 <18 ng/L    Comment: (NOTE) Elevated high sensitivity troponin I (hsTnI) values and significant  changes across serial measurements may suggest ACS but many other  chronic and acute conditions are known to elevate hsTnI results.  Refer to the "Links" section for chest pain algorithms and additional  guidance. Performed at [facility], [place],  [address]   Troponin I (High Sensitivity)     Status: None   Collection Time: 05/30/19  1:32 PM  Result Value Ref Range   Troponin I (High Sensitivity) 7 <18 ng/L    Comment: (NOTE) Elevated high sensitivity troponin I (hsTnI) values and significant  changes across serial measurements may suggest ACS but many other  chronic and acute conditions are known to elevate hsTnI results.  Refer to the "Links" section for chest pain algorithms and additional  guidance. Performed at [facility], [address] [place],  [address]     Radiology: DG Chest 2 View  Result Date: 05/30/2019 CLINICAL DATA:  Chest pain EXAM: CHEST - 2 VIEW COMPARISON:  03/07/2019 FINDINGS: Mild peribronchial thickening. No confluent opacities. Heart is normal  size. No effusions or acute bony abnormality. IMPRESSION: Mild  bronchitic changes. Electronically Signed   By: Charlett Nose M.D.   On: 05/30/2019 11:17    No results found.  DG Chest 2 View  Result Date: 05/30/2019 CLINICAL DATA:  Chest pain EXAM: CHEST - 2 VIEW COMPARISON:  03/07/2019 FINDINGS: Mild peribronchial thickening. No confluent opacities. Heart is normal size. No effusions or acute bony abnormality. IMPRESSION: Mild bronchitic changes. Electronically Signed   By: Charlett Nose M.D.   On: 05/30/2019 11:17      Assessment and Plan: Patient Active Problem List   Diagnosis Date Noted  . Uncontrolled hypertension 11/22/2018  . Chronic respiratory failure with hypoxia (HCC) 10/08/2017  . Simple chronic bronchitis (HCC) 10/08/2017  . SOB (shortness of breath) 09/24/2017  . History of stroke 08/17/2017  . Acute respiratory failure with hypoxia and hypercarbia (HCC) 08/07/2017  . Respiratory failure (HCC) 08/07/2017  . History of GI bleed 07/07/2017  . Atrial fibrillation with RVR (HCC) 06/30/2017  . Bilateral carotid artery disease (HCC) 04/14/2017  . Healthcare maintenance 04/14/2017  . Dizziness 04/07/2017  . 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 TIA (transient ischemic attack) 12/10/2016  . CVA (cerebral vascular accident) (HCC) 12/09/2016  . Bilateral arm pain 11/27/2016  . Myocardial infarction (HCC) 07/30/2015  . Urinary frequency 02/09/2015  . RUQ abdominal pain 01/26/2015  . Shortness of breath 01/26/2015  . Anxiety 01/26/2015  . Chronic pruritus 12/21/2014  . Cerebral vascular accident (HCC) 11/16/2014  . Chronic low back pain 11/16/2014  . Adult hypothyroidism 11/15/2014  . Allergic rhinitis 11/15/2014  . Body mass index (BMI) of 28.0-28.9 in adult 11/15/2014  . Arthritis 11/15/2014  . Cramps of lower extremity 11/15/2014  . Essential (primary) hypertension 11/15/2014  . Acid reflux 11/15/2014  . Hypercholesteremia 11/15/2014  . Cannot sleep 11/15/2014  . Psoriasis 11/15/2014  . Itch of skin  11/15/2014  . Restless leg 11/15/2014  . Diabetes mellitus, type 2 (HCC) 11/15/2014  . Breath shortness 11/15/2014  . Dermatitis due to unknown cause 04/10/2014  . Arteriosclerosis of coronary artery 02/20/2014  . AF (paroxysmal atrial fibrillation) (HCC) 02/20/2014  . Temporary cerebral vascular dysfunction 02/20/2014  . DD (diverticular disease) 10/05/2013    1. COPD patient likely had a mild exacerbation however she is completely over it.  I am going to give her a prescription for prednisone to be taken taper over the course of the next 21 tablets.  The patient is allergic to multiple drugs so she cannot do Levaquin or amoxicillin.  I have given her prescription for doxycycline in place of the azithromycin which she completed.  Also would recommend getting a follow-up chest x-ray on her to make certain that there is no new pneumonia. 2. Cough Tessalon Perles have been given not really helping her so I recommended that we switch over to Tussionex however I clearly instructed her to only take 1 teaspoon in the evening.  This would also help her to fall asleep because she states that she is not doing well as far as sleeping is concerned. 3. SOB/CP once her symptoms are improved recommended coming in for pulmonary function studies to be done 1 to make certain that she is not having rapid decline in her COPD 4. A fib currently is rate controlled she did not really have much of a positive work-up when she came into the emergency department last week.  We will continue to monitor her.  General Counseling: I  have discussed the findings of the evaluation and [facility] with [facility].  I have also discussed any further diagnostic evaluation thatmay be needed or ordered today. Suttyn verbalizes understanding of the findings of todays visit. We also reviewed her medications today and discussed drug interactions and side effects including but not limited excessive drowsiness and altered mental states. We also  discussed that there is always a risk not just to her but also people around her. she has been encouraged to call the office with any questions or concerns that should arise related to todays visit.  Orders Placed This Encounter  Procedures  . DG Chest 2 View    Standing Status:   Future    Standing Expiration Date:   08/03/2020    Order Specific Question:   Reason for Exam (SYMPTOM  OR DIAGNOSIS REQUIRED)    Answer:   cough    Order Specific Question:   Preferred imaging location?    Answer:   [facility]    Order Specific Question:   Radiology Contrast Protocol - do NOT remove file path    Answer:   [email or web address]  . Pulmonary function test    Standing Status:   Future    Standing Expiration Date:   06/05/2020    Order Specific Question:   Where should this test be performed?    Answer:   [facility]     Time spent: Total time 35 minutes  I have personally obtained a history, examined the patient, evaluated laboratory and imaging results, formulated the assessment and plan and placed orders.    Yevonne Pax, MD [facility] Pulmonary and Critical Care Sleep medicine

## 2019-06-06 NOTE — Patient Instructions (Signed)
Chronic Bronchitis, Adult Chronic bronchitis is long-lasting inflammation of the tubes that carry air into your lungs (bronchial tubes). This is inflammation that occurs:  On most days of the week.  For at least three months at a time.  Over a period of two years in a row. When the bronchial tubes are inflamed, they start to produce mucus. The inflammation and buildup of mucus make it more difficult to breathe. Chronic bronchitis is usually a permanent problem. It is one type of chronic obstructive pulmonary disease (COPD). People with chronic bronchitis are more likely to get frequent colds or respiratory infections. What are the causes? Chronic bronchitis most often occurs in people who:  Have chronic, severe asthma.  Have a history of smoking.  Have asthma and smoke.  Have certain lung diseases.  Have had long-term exposure to certain irritating fumes or chemicals. What are the signs or symptoms? Symptoms of chronic bronchitis may include:  A cough that brings up mucus (productive cough).  Shortness of breath.  Loud breathing (wheezing).  Chest discomfort.  Frequent (recurring) colds or respiratory infections. Certain things can trigger chronic bronchitis symptoms or make them worse, such as:  Infections.  Stopping certain medicines.  Smoking.  Exposure to chemicals. How is this diagnosed? This condition may be diagnosed based on:  Your symptoms and medical history.  A physical exam.  A chest X-ray.  Lung (pulmonary) function tests. How is this treated? There is no cure for chronic bronchitis, but treatment can help control your symptoms. Treatment may include:  Using a cool mist vaporizer or humidifier to make it easier to breathe.  Drinking more fluids. Drinking more makes your mucus thinner, which may make it easier to breathe.  Lifestyle changes, such as eating a healthier diet and getting more exercise.  Medicines, such as: ? Inhalers to improve  air flow in and out of your lungs. ? Antibiotics to treat any bacterial infections you have, such as:  Lung infection (pneumonia).  Sinus infection.  A sudden, severe (acute) episode of bronchitis.  Oxygen therapy.  Preventing infections by keeping up to date on vaccinations, including the pneumonia and flu vaccines.  Pulmonary rehabilitation. This is a program that helps you manage your breathing problems and improve your quality of life. It may last for up to 4-12 weeks and may include exercise programs, education, counseling, and treatment support. Follow these instructions at home: Medicines  Take over-the-counter and prescription medicines only as told by your health care provider.  If you were prescribed an antibiotic medicine, take it as told by your health care provider. Do not stop taking the antibiotic even if you start to feel better. Preventing infections  Get vaccinations as told by your health care provider. Make sure you get a flu shot (influenza vaccine) every year.  Wash your hands often with soap and water. If soap and water are not available, use hand sanitizer.  Avoid contact with people who have symptoms of a cold or the flu. Managing symptoms   Do not smoke, and avoid secondhand smoke. Exposure to cigarette smoke or irritating chemicals will make bronchitis worse. If you smoke and you need help quitting, ask your health care provider. Quitting smoking will help your lungs heal faster.  Use an inhaler, cool mist vaporizer, or humidifier as told by your health care provider.  Avoid pollen, dust, animal dander, molds, smoke, and other things that cause shortness of breath or wheezing attacks.  Use oxygen therapy at home as directed. Follow   instructions from your health care provider about how to use oxygen safely and take precautions to prevent fire. Make sure you never smoke while using oxygen or allow others to smoke in your home.  Do not wait to get medical  care if you have any concerning symptoms or trouble breathing. Waiting could cause permanent injury and may be life threatening. General instructions  Talk with your health care provider about what activities are safe for you and about possible exercise routines. Regular exercise is very important to help you feel better.  Drink enough fluids to keep your urine pale yellow.  Keep all follow-up visits as told by your health care provider. This is important. Contact a health care provider if:  You have coughing or shortness of breath that gets worse.  You have muscle aches.  You have chest pain.  Your mucus seems to get thicker.  Your mucus changes from clear or white to yellow, green, gray, or bloody. Get help right away if:  Your usual medicines do not stop your wheezing.  You have severe difficulty breathing. These symptoms may represent a serious problem that is an emergency. Do not wait to see if the symptoms will go away. Get medical help right away. Call your local emergency services (911 in the [place] Do not drive yourself to the hospital. Summary  Chronic bronchitis is long-lasting inflammation of the tubes that carry air into your lungs (bronchial tubes).  Chronic bronchitis is usually a permanent problem. It is one type of chronic obstructive pulmonary disease (COPD).  There is no cure for chronic bronchitis, but treatment can help control your symptoms.  Do not smoke, and avoid secondhand smoke. Exposure to cigarette smoke or irritating chemicals will make bronchitis worse. This information is not intended to replace advice given to you by your health care provider. Make sure you discuss any questions you have with your health care provider. Document Revised: 03/11/2018 Document Reviewed: 04/08/2017 Elsevier Patient Education  2020 [employer] Inc[employer]  

## 2019-06-13 ENCOUNTER — Telehealth: Payer: Self-pay

## 2019-06-13 NOTE — Telephone Encounter (Signed)
CXR showed mild inflammation, let her know go for C-19 test, let us know how is she feeling

## 2019-06-14 ENCOUNTER — Other Ambulatory Visit: Payer: Self-pay | Admitting: Internal Medicine

## 2019-06-14 MED ORDER — AZITHROMYCIN 250 MG PO TABS: ORAL_TABLET | ORAL | 0 refills | Status: DC

## 2019-06-14 NOTE — Telephone Encounter (Signed)
Ok, I sent new RX for azithromycin, she can stop doxy and start this, needs to drink more water and let us know tomorrow about her condition

## 2019-06-14 NOTE — Telephone Encounter (Signed)
Patient's daughter has been advised of changes. beth

## 2019-06-14 NOTE — Telephone Encounter (Signed)
Spoke with patient's daughter and per daughter pt is still very weak and coughing with short of breath and has not schd covid test(pt doesn't want test), I advised the importance of getting checked out with hospital since her symptoms are worsening. Beth

## 2019-06-16 ENCOUNTER — Ambulatory Visit: Payer: Medicare Other | Attending: Internal Medicine

## 2019-06-16 DIAGNOSIS — Z20822 Contact with and (suspected) exposure to covid-19: Secondary | ICD-10-CM

## 2019-06-17 ENCOUNTER — Telehealth: Payer: Self-pay

## 2019-06-17 LAB — NOVEL CORONAVIRUS, NAA: SARS-CoV-2, NAA: NOT DETECTED

## 2019-06-23 ENCOUNTER — Telehealth: Payer: Self-pay

## 2019-06-23 ENCOUNTER — Other Ambulatory Visit: Payer: Self-pay | Admitting: Adult Health

## 2019-06-23 DIAGNOSIS — K219 Gastro-esophageal reflux disease without esophagitis: Secondary | ICD-10-CM

## 2019-06-23 DIAGNOSIS — R05 Cough: Secondary | ICD-10-CM

## 2019-06-23 DIAGNOSIS — R059 Cough, unspecified: Secondary | ICD-10-CM

## 2019-06-23 MED ORDER — HYDROCOD POLST-CPM POLST ER 10-8 MG/5ML PO SUER: 5.0000 mL | Freq: Every day | ORAL | 0 refills | Status: DC

## 2019-06-23 MED ORDER — AZITHROMYCIN 250 MG PO TABS: ORAL_TABLET | ORAL | 0 refills | Status: DC

## 2019-06-23 NOTE — Telephone Encounter (Signed)
Send pres  

## 2019-06-23 NOTE — Progress Notes (Signed)
Sent refill on Tussionex cough syrup for patient.

## 2019-06-23 NOTE — Telephone Encounter (Signed)
Spoke with pt daughter pt feeling much better still coughing as per dr Welton Flakes advised take nebulizer 4 times daily and also we send Tussionex for cough also take prednisone one more week if she is not feeling better she virtual visit next week

## 2019-06-23 NOTE — Telephone Encounter (Signed)
Nimisha has taken care and spoke with provider.

## 2019-06-24 ENCOUNTER — Telehealth: Payer: Self-pay

## 2019-06-24 NOTE — Telephone Encounter (Signed)
Pt daughter called she is feeling much advised finished antibiotic and prednisone and keep follow up next week

## 2019-06-29 ENCOUNTER — Ambulatory Visit: Payer: Medicare Other | Admitting: Internal Medicine

## 2019-07-04 ENCOUNTER — Telehealth: Payer: Self-pay

## 2019-07-04 NOTE — Telephone Encounter (Signed)
Confirmed appointment with patient and screened for covid. [facility] 

## 2019-07-06 ENCOUNTER — Other Ambulatory Visit: Payer: Self-pay

## 2019-07-06 ENCOUNTER — Ambulatory Visit: Payer: Medicare Other | Admitting: Internal Medicine

## 2019-07-06 DIAGNOSIS — R0602 Shortness of breath: Secondary | ICD-10-CM | POA: Diagnosis not present

## 2019-07-06 LAB — PULMONARY FUNCTION TEST

## 2019-07-12 ENCOUNTER — Ambulatory Visit: Payer: Medicare Other | Admitting: Internal Medicine

## 2019-07-12 ENCOUNTER — Telehealth: Payer: Self-pay

## 2019-07-12 NOTE — Telephone Encounter (Signed)
Confirmed appointment on 07/14/2019 and screened for covid. [facility] 

## 2019-07-13 NOTE — Procedures (Signed)
[facility] [address] [place], [address]  DATE OF SERVICE: July 06, 2019  Complete Pulmonary Function Testing Interpretation:  FINDINGS:  Forced vital capacity is 1.55 L which is 72% of predicted and is mildly decreased.  The FEV1 is 1.17 L which is 72% of predicted and is mildly decreased.  The FEV1 FVC ratio is mildly decreased.  Total lung capacity is normal residual volume is increased residual in total lung capacity ratio is decreased FRC is normal.  DLCO is mildly decreased.  DLCO corrected for alveolar volume is normal.  Postbronchodilator no significant change in the FEV1 clinical improvement may still occur in the absence of spirometric improvement  IMPRESSION:  This pulmonary function study is consistent with mild obstructive lung disease.  There is some evidence of difficulty reproducing the flow volume loop clinical correlation is recommended.  DLCO is normal corrected for alveolar volume.  No significant response to bronchodilators however clinical improvement may still occur in the absence of spirometric improvement.  Yevonne Pax, MD [facility] Pulmonary Critical Care Medicine Sleep Medicine

## 2019-07-14 ENCOUNTER — Other Ambulatory Visit: Payer: Self-pay

## 2019-07-14 ENCOUNTER — Ambulatory Visit: Payer: Medicare Other | Admitting: Internal Medicine

## 2019-07-14 ENCOUNTER — Encounter: Payer: Self-pay | Admitting: Internal Medicine

## 2019-07-14 VITALS — BP 128/86 | HR 84 | Temp 97.3°F | Resp 16 | Ht 62.0 in | Wt 170.2 lb

## 2019-07-14 DIAGNOSIS — Z9981 Dependence on supplemental oxygen: Secondary | ICD-10-CM | POA: Diagnosis not present

## 2019-07-14 DIAGNOSIS — K219 Gastro-esophageal reflux disease without esophagitis: Secondary | ICD-10-CM

## 2019-07-14 DIAGNOSIS — I482 Chronic atrial fibrillation, unspecified: Secondary | ICD-10-CM | POA: Diagnosis not present

## 2019-07-14 DIAGNOSIS — J449 Chronic obstructive pulmonary disease, unspecified: Secondary | ICD-10-CM

## 2019-07-14 NOTE — Progress Notes (Signed)
[facility] [address] [place], [place] [address]  Pulmonary Sleep Medicine   Office Visit Note  Patient Name: Peggy [name] DOB: January 21, 1929 MRN [id number]  Date of Service: 07/14/2019   Complaints/HPI: Here for routine pulmonary follow up. She has recently recovered from a COPD exacerbation, treated with prednisone and antibiotics. Daughter accompanied patient today and had many questions regarding how to prevent another exacerbation. We discussed using her Symbicort BID and subtle symptoms that could be indicators of an exacerbation such as increased shortness of breath, chest tightness/pressure or cough. Discussed starting to use nebulizer therapy at that time as well as contacting office right away to get started on other therapies to help reduce exacerbation symptoms.  Sergio reports she is feeling much better today, feels as though she is back to her baseline. Recent PFT from 2/3 finds FEV1 to be 1.17 which is a slight decrease but not significantly worse.  PFT finds mild obstructive lung disease, consistent with her previous diagnosis. Continues to use Symbicort inhaler daily as well as nebulizer and albuterol inhaler as needed. Currently using supplemental oxygen via [place] at 2.5LPM, back down to her baseline at this time, during exacerbation titrated up to 4LPM. Wearing 2-2.5LPM via [place] at all times during day and at night. Denies shortness of breath, cough or chest pain.  ROS  General: (-) fever, (-) chills, (-) night sweats, (-) weakness Skin: (-) rashes, (-) itching,. Eyes: (-) visual changes, (-) redness, (-) itching. Nose and Sinuses: (-) nasal stuffiness or itchiness, (-) postnasal drip, (-) nosebleeds, (-) sinus trouble. Mouth and Throat: (-) sore throat, (-) hoarseness. Neck: (-) swollen glands, (-) enlarged thyroid, (-) neck pain. Respiratory: - cough, (-) bloody sputum, - shortness of breath, - wheezing. Cardiovascular: - ankle swelling, (-) chest pain. Lymphatic:  (-) lymph node enlargement. Neurologic: (-) numbness, (-) tingling. Psychiatric: (-) anxiety, (-) depression   Current Medication: Outpatient Encounter Medications as of 07/14/2019  Medication Sig  . aspirin EC 81 MG EC tablet Take 1 tablet (81 mg total) by mouth daily.  Marland Kitchen azithromycin (ZITHROMAX) 250 MG tablet Take one tab a day for 7 days for uri  . benzonatate (TESSALON PERLES) 100 MG capsule Take 1 capsule (100 mg total) by mouth every 6 (six) hours as needed for cough.  . budesonide-formoterol (SYMBICORT) 80-4.5 MCG/ACT inhaler Inhale 1 puff into the lungs 2 (two) times daily.  . chlorpheniramine-HYDROcodone (TUSSIONEX PENNKINETIC ER) 10-8 MG/5ML SUER Take 5 mLs by mouth daily.  . Cholecalciferol (VITAMIN D3) 5000 units TABS Take 5,000 Units by mouth daily.  Marland Kitchen guaiFENesin (MUCINEX) 600 MG 12 hr tablet Take by mouth 2 (two) times daily.  Marland Kitchen guaiFENesin-codeine (ROBITUSSIN AC) 100-10 MG/5ML syrup Take 5 mLs by mouth 3 (three) times daily as needed for cough.  Marland Kitchen HUMULIN 70/30 KWIKPEN (70-30) 100 UNIT/ML PEN Inject 20 Units into the skin 2 (two) times daily. (Patient taking differently: Inject 35 Units into the skin 2 (two) times daily. )  . hydrOXYzine (ATARAX/VISTARIL) 25 MG tablet Take 25 mg by mouth 4 (four) times daily as needed for itching.  . lactobacillus acidophilus (BACID) TABS tablet Take 2 tablets by mouth daily.  Marland Kitchen levothyroxine (SYNTHROID, LEVOTHROID) 50 MCG tablet Take 1 tablet (50 mcg total) by mouth daily. PATIENT NEEDS TO SCHEDULE OFFICE VISIT FOR FOLLOW UP  . metoprolol succinate (TOPROL-XL) 100 MG 24 hr tablet Take 1 tablet (100 mg total) by mouth daily. Take with or immediately following a meal.  . OXYGEN Inhale 2  L into the lungs.  . pantoprazole (PROTONIX) 40 MG tablet TAKE 1 TABLET(40 MG) BY MOUTH DAILY  . predniSONE (DELTASONE) 10 MG tablet Take 1 tablet (10 mg total) by mouth daily with breakfast.  . VENTOLIN HFA 108 (90 Base) MCG/ACT inhaler Inhale 2 puffs into the  lungs every 6 (six) hours as needed.  . vitamin B-12 (CYANOCOBALAMIN) 1000 MCG tablet Take 1,000 mcg by mouth daily.   No facility-administered encounter medications on file as of 07/14/2019.    Surgical History: Past Surgical History:  Procedure Laterality Date  . ABDOMINAL HYSTERECTOMY  1970  . BACK SURGERY  2013  . colectomy Right 10/08/2013   Dr. Egbert Garibaldi  . CORONARY ANGIOPLASTY WITH STENT PLACEMENT      Medical History: Past Medical History:  Diagnosis Date  . A-fib (HCC)   . Allergy   . Arthritis   . COPD (chronic obstructive pulmonary disease) (HCC)   . Diabetes mellitus without complication (HCC)   . GERD (gastroesophageal reflux disease)   . Hyperlipidemia   . Hypertension   . Oxygen dependent    2 liters  . Stroke [facility])    5 years ago per daughter  . TIA (transient ischemic attack)     Family History: Family History  Problem Relation Age of Onset  . Breast cancer Mother   . Pancreatitis Father   . Breast cancer Sister   . Lung cancer Brother   . Melanoma Brother   . Throat cancer Brother     Social History: Social History   Socioeconomic History  . Marital status: Widowed    Spouse name: Not on file  . Number of children: 3  . Years of education: H/S  . Highest education level: Not on file  Occupational History  . Occupation: Retired  Tobacco Use  . Smoking status: Former Smoker    Packs/day: 1.00    Years: 30.00    Pack years: 30.00    Quit date: 11/30/1999    Years since quitting: 19.6  . Smokeless tobacco: Never Used  Substance and Sexual Activity  . Alcohol use: No  . Drug use: No  . Sexual activity: Not on file  Other Topics Concern  . Not on file  Social History Narrative  . Not on file   Social Determinants of Health   Financial Resource Strain:   . Difficulty of Paying Living Expenses: Not on file  Food Insecurity:   . Worried About Programme researcher, broadcasting/film/video in the Last Year: Not on file  . Ran Out of Food in the Last Year: Not on  file  Transportation Needs:   . Lack of Transportation (Medical): Not on file  . Lack of Transportation (Non-Medical): Not on file  Physical Activity:   . Days of Exercise per Week: Not on file  . Minutes of Exercise per Session: Not on file  Stress:   . Feeling of Stress : Not on file  Social Connections:   . Frequency of Communication with Friends and Family: Not on file  . Frequency of Social Gatherings with Friends and Family: Not on file  . Attends Religious Services: Not on file  . Active Member of Clubs or Organizations: Not on file  . Attends Banker Meetings: Not on file  . Marital Status: Not on file  Intimate Partner Violence:   . Fear of Current or Ex-Partner: Not on file  . Emotionally Abused: Not on file  . Physically Abused: Not on file  .  Sexually Abused: Not on file    Vital Signs: Blood pressure 128/86, pulse 84, temperature (!) 97.3 F (36.3 C), resp. rate 16, height 5\' 2"  (1.575 m), weight 170 lb 3.2 oz (77.2 kg), SpO2 99 %.  Examination: General Appearance: The patient is well-developed, well-nourished, and in no distress. Skin: Gross inspection of skin unremarkable. Head: normocephalic, no gross deformities. Eyes: no gross deformities noted. ENT: ears appear grossly normal no exudates. Neck: Supple. No thyromegaly. No LAD. Respiratory: Diminished lung sounds bilaterally. Cardiovascular: Normal S1 and S2 without murmur or rub. Extremities: No cyanosis. pulses are equal. Neurologic: Alert and oriented. No involuntary movements.  LABS: Recent Results (from the past 2160 hour(s))  Basic metabolic panel     Status: Abnormal   Collection Time: 05/30/19 10:26 AM  Result Value Ref Range   Sodium 139 135 - 145 mmol/L   Potassium 4.1 3.5 - 5.1 mmol/L   Chloride 105 98 - 111 mmol/L   CO2 23 22 - 32 mmol/L   Glucose, Bld 214 (H) 70 - 99 mg/dL   BUN 16 8 - 23 mg/dL   Creatinine, Ser 06/01/19 0.44 - 1.00 mg/dL   Calcium 9.3 8.9 - 6.33 mg/dL   GFR  calc non Af Amer >60 >60 mL/min   GFR calc Af Amer >60 >60 mL/min   Anion gap 11 5 - 15    Comment: Performed at [facility], [address] [place], [place]  CBC     Status: Abnormal   Collection Time: 05/30/19 10:26 AM  Result Value Ref Range   WBC 9.5 4.0 - 10.5 K/uL   RBC 5.56 (H) 3.87 - 5.11 MIL/uL   Hemoglobin 14.1 12.0 - 15.0 g/dL   HCT 06/01/19 38.9 - 37.3 %   MCV 82.4 80.0 - 100.0 fL   MCH 25.4 (L) 26.0 - 34.0 pg   MCHC 30.8 30.0 - 36.0 g/dL   RDW 42.8 76.8 - 11.5 %   Platelets 286 150 - 400 K/uL   nRBC 0.0 0.0 - 0.2 %    Comment: Performed at [facility], [address], [place], [place]  Troponin I (High Sensitivity)     Status: None   Collection Time: 05/30/19 10:26 AM  Result Value Ref Range   Troponin I (High Sensitivity) 8 <18 ng/L    Comment: (NOTE) Elevated high sensitivity troponin I (hsTnI) values and significant  changes across serial measurements may suggest ACS but many other  chronic and acute conditions are known to elevate hsTnI results.  Refer to the "Links" section for chest pain algorithms and additional  guidance. Performed at [facility], [address] [place], [place]   Troponin I (High Sensitivity)     Status: None   Collection Time: 05/30/19  1:32 PM  Result Value Ref Range   Troponin I (High Sensitivity) 7 <18 ng/L    Comment: (NOTE) Elevated high sensitivity troponin I (hsTnI) values and significant  changes across serial measurements may suggest ACS but many other  chronic and acute conditions are known to elevate hsTnI results.  Refer to the "Links" section for chest pain algorithms and additional  guidance. Performed at [facility], [address] [place], [place]   Novel Coronavirus, NAA ([facility])     Status: None   Collection Time: 06/16/19  2:16 PM   Specimen: Nasopharyngeal(NP) swabs in vial transport medium   NASOPHARYNGE  TESTING  Result Value  Ref Range  SARS-CoV-2, NAA Not Detected Not Detected    Comment: This nucleic acid amplification test was developed and its performance characteristics determined by [employer]. Nucleic acid amplification tests include PCR and TMA. This test has not been FDA cleared or approved. This test has been authorized by FDA under an Emergency Use Authorization (EUA). This test is only authorized for the duration of time the declaration that circumstances exist justifying the authorization of the emergency use of in vitro diagnostic tests for detection of SARS-CoV-2 virus and/or diagnosis of COVID-19 infection under section 564(b)(1) of the Act, 21 U.S.C. 885OYD-7(A) (1), unless the authorization is terminated or revoked sooner. When diagnostic testing is negative, the possibility of a false negative result should be considered in the context of a patient's recent exposures and the presence of clinical signs and symptoms consistent with COVID-19. An individual without symptoms of COVID-19 and who is not shedding SARS-CoV-2 virus would  expect to have a negative (not detected) result in this assay.     Radiology: DG Chest 2 View  Result Date: 06/06/2019 CLINICAL DATA:  Productive cough and shortness of breath for 1 week. EXAM: CHEST - 2 VIEW COMPARISON:  05/30/2019 FINDINGS: The cardiomediastinal silhouette is unchanged with normal heart size. Aortic atherosclerosis is noted. There is persistent mild peribronchial thickening. No acute airspace consolidation, edema, pleural effusion, pneumothorax is identified. A subtle subcentimeter left midlung nodular density corresponds to a known nodule in the superior segment of the left lower lobe on CT. There is biapical pleural thickening. No acute osseous abnormality is evident. IMPRESSION: Persistent mild bronchitic changes. Electronically Signed   By: Logan Bores M.D.   On: 06/06/2019 15:56    No results found.  No results  found.    Assessment and Plan: Patient Active Problem List   Diagnosis Date Noted  . Uncontrolled hypertension 11/22/2018  . Chronic respiratory failure with hypoxia ([place]) 10/08/2017  . Simple chronic bronchitis ([place]) 10/08/2017  . SOB (shortness of breath) 09/24/2017  . History of stroke 08/17/2017  . Acute respiratory failure with hypoxia and hypercarbia ([place]) 08/07/2017  . Respiratory failure ([place]) 08/07/2017  . History of GI bleed 07/07/2017  . Atrial fibrillation with RVR ([place]) 06/30/2017  . Bilateral carotid artery disease ([place]) 04/14/2017  . Healthcare maintenance 04/14/2017  . Dizziness 04/07/2017  . 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 TIA (transient ischemic attack) 12/10/2016  . CVA (cerebral vascular accident) ([place]) 12/09/2016  . Bilateral arm pain 11/27/2016  . Myocardial infarction ([place]) 07/30/2015  . Urinary frequency 02/09/2015  . RUQ abdominal pain 01/26/2015  . Shortness of breath 01/26/2015  . Anxiety 01/26/2015  . Chronic pruritus 12/21/2014  . Cerebral vascular accident ([place]) 11/16/2014  . Chronic low back pain 11/16/2014  . Adult hypothyroidism 11/15/2014  . Allergic rhinitis 11/15/2014  . Body mass index (BMI) of 28.0-28.9 in adult 11/15/2014  . Arthritis 11/15/2014  . Cramps of lower extremity 11/15/2014  . Essential (primary) hypertension 11/15/2014  . Acid reflux 11/15/2014  . Hypercholesteremia 11/15/2014  . Cannot sleep 11/15/2014  . Psoriasis 11/15/2014  . Itch of skin 11/15/2014  . Restless leg 11/15/2014  . Diabetes mellitus, type 2 ([place]) 11/15/2014  . Breath shortness 11/15/2014  . Dermatitis due to unknown cause 04/10/2014  . Arteriosclerosis of coronary artery 02/20/2014  . AF (paroxysmal atrial fibrillation) ([place]) 02/20/2014  . Temporary cerebral vascular dysfunction 02/20/2014  . DD (diverticular disease) 10/05/2013    1. Chronic obstructive pulmonary disease, unspecified COPD type (  HCC) Back to baseline  at this time after recent exacerbation. Continue with current therapies and continue to monitor.  2. Oxygen dependent Wearing 2-2.5LPM via [place] at all times during the day and at night. Stable on this regimen at this time, continue to monitor.  3. Chronic atrial fibrillation (HCC) Stable at this time, continue current therapy and continue to monitor. Daily ASA 81mg  at this time for anticoagulation.  4. Gastroesophageal reflux disease without esophagitis Stable at this time on current therapy, continue to monitor.  General Counseling: I have discussed the findings of the evaluation and examination with .  I have also discussed any further diagnostic evaluation thatmay be needed or ordered today. Tarah verbalizes understanding of the findings of todays visit. We also reviewed her medications today and discussed drug interactions and side effects including but not limited excessive drowsiness and altered mental states. We also discussed that there is always a risk not just to her but also people around her. she has been encouraged to call the office with any questions or concerns that should arise related to todays visit.  No orders of the defined types were placed in this encounter.    Time spent: 30 includes 10 minutes of chart review This patient was seen by Joyce Gross AGNP-C in Collaboration with Dr. Blima Ledger as a part of collaborative care agreement.  I have personally obtained a history, examined the patient, evaluated laboratory and imaging results, formulated the assessment and plan and placed orders.    Freda Munro, MD [facility] Pulmonary and Critical Care Sleep medicine

## 2019-07-19 ENCOUNTER — Telehealth: Payer: Self-pay

## 2019-07-19 NOTE — Telephone Encounter (Signed)
Pt is coughing again, with a little wheezing and sob. Pt daughter states this ususally happens on rainy days, but would still like for pt to have something. Pt daughter also states that she is concerned about being on antibiotics because pt has been on antibiotics for all of January and has only been off for 2 weeks.  Spoke with adam and advised pt daughter for pt to take otc cough syrup like delsym, mucinex, and use a nebulizer. Confirmed that pt has neb but pt daughter did not know the name. Also advised pt to take tessalon, but pt daughter said that those do no work for her.

## 2019-07-25 ENCOUNTER — Ambulatory Visit: Payer: Medicare Other

## 2019-09-21 ENCOUNTER — Other Ambulatory Visit: Payer: Self-pay

## 2019-09-21 ENCOUNTER — Telehealth: Payer: Self-pay

## 2019-09-21 DIAGNOSIS — K219 Gastro-esophageal reflux disease without esophagitis: Secondary | ICD-10-CM

## 2019-09-21 MED ORDER — PANTOPRAZOLE SODIUM 40 MG PO TBEC: DELAYED_RELEASE_TABLET | ORAL | 0 refills | Status: DC

## 2019-09-21 NOTE — Telephone Encounter (Signed)
Pt daughter called to say that pt is having indigestion and chest pain due to indigestion. Pt daughter also stated once pt gets off pantoprazole that pt develops a cough. Pt daughter stated that they discussed with adam that pt should not be on pantoprazole for a long period and pt has been on med for a little over a year and wanted to know if she should continue use of pantoprazole.  Spoke with adam and discussed with pt daughter to take otc tums to help with relief and if this is a continuing problem to set up an appt with a GI doctor to discuss further treatment. Also notified pt daughter that we would like to see pt to further assess the pt but pt daughter stated that she does not want to pay the $50 just to discuss continued use of pantoprazole. I advised pt daughter that based on her symptoms adam still advises that pt be seen by [place] or if chest pain and indigestion continue to linger then to go to the ER. Pt daughter verbalized that pt is taking OTC tums but still having these issues. I advised pt again to be seen by [place] or go ER if lingering chest pain because we do not want to prolong this issue due to there being other health risks and pt daughter verbalized understanding and also stated that she will get a GI appt set up for pt. I sent in fill for pantoprazole. I told pt daughter if there are any other concerns to give [place] a call even after hours. I will follow up with pt tomorrow.

## 2019-09-22 ENCOUNTER — Telehealth: Payer: Self-pay

## 2019-09-22 NOTE — Telephone Encounter (Signed)
Called pt daughter to follow up in regards to the chest pain and indigestion pt was having yesterday. Pt daughter stated that pt will be seeing her PCP today because pt was having chest pains last night.

## 2019-10-10 ENCOUNTER — Telehealth: Payer: Self-pay

## 2019-10-10 NOTE — Telephone Encounter (Signed)
Patient cancelled appointment on 10/13/2019 will call back to reschedule. klh

## 2019-10-13 ENCOUNTER — Ambulatory Visit: Payer: Medicare Other | Admitting: Internal Medicine

## 2019-11-09 DIAGNOSIS — R079 Chest pain, unspecified: Secondary | ICD-10-CM | POA: Insufficient documentation

## 2019-12-10 ENCOUNTER — Other Ambulatory Visit: Payer: Self-pay | Admitting: Adult Health

## 2019-12-10 DIAGNOSIS — K219 Gastro-esophageal reflux disease without esophagitis: Secondary | ICD-10-CM

## 2019-12-31 ENCOUNTER — Other Ambulatory Visit: Payer: Self-pay | Admitting: Adult Health

## 2019-12-31 DIAGNOSIS — K219 Gastro-esophageal reflux disease without esophagitis: Secondary | ICD-10-CM

## 2020-01-04 ENCOUNTER — Ambulatory Visit: Payer: Medicare Other | Admitting: Adult Health

## 2020-01-05 ENCOUNTER — Ambulatory Visit: Payer: Medicare Other | Admitting: Internal Medicine

## 2020-01-05 ENCOUNTER — Encounter: Payer: Self-pay | Admitting: Internal Medicine

## 2020-01-05 ENCOUNTER — Other Ambulatory Visit: Payer: Self-pay

## 2020-01-05 ENCOUNTER — Telehealth: Payer: Self-pay

## 2020-01-05 VITALS — BP 170/67 | HR 81 | Temp 97.2°F | Resp 16 | Ht 62.0 in | Wt 169.8 lb

## 2020-01-05 DIAGNOSIS — J449 Chronic obstructive pulmonary disease, unspecified: Secondary | ICD-10-CM | POA: Diagnosis not present

## 2020-01-05 DIAGNOSIS — Z9981 Dependence on supplemental oxygen: Secondary | ICD-10-CM

## 2020-01-05 DIAGNOSIS — R05 Cough: Secondary | ICD-10-CM

## 2020-01-05 DIAGNOSIS — K219 Gastro-esophageal reflux disease without esophagitis: Secondary | ICD-10-CM | POA: Diagnosis not present

## 2020-01-05 DIAGNOSIS — R079 Chest pain, unspecified: Secondary | ICD-10-CM | POA: Diagnosis not present

## 2020-01-05 DIAGNOSIS — R059 Cough, unspecified: Secondary | ICD-10-CM

## 2020-01-05 MED ORDER — BREZTRI AEROSPHERE 160-9-4.8 MCG/ACT IN AERO: 2.0000 | INHALATION_SPRAY | Freq: Two times a day (BID) | RESPIRATORY_TRACT | 3 refills | Status: DC

## 2020-01-05 MED ORDER — ESOMEPRAZOLE MAGNESIUM 40 MG PO CPDR: 40.0000 mg | DELAYED_RELEASE_CAPSULE | Freq: Every day | ORAL | 3 refills | Status: DC

## 2020-01-05 MED ORDER — HYDROCOD POLST-CPM POLST ER 10-8 MG/5ML PO SUER: 5.0000 mL | Freq: Every day | ORAL | 0 refills | Status: DC

## 2020-01-05 NOTE — Telephone Encounter (Signed)
Completed medical record request and mailed records to [facility] [address] [place], [place] [address].

## 2020-01-05 NOTE — Progress Notes (Signed)
[facility] [address] [place], [place] [address]  Pulmonary Sleep Medicine   Office Visit Note  Patient Name: Peggy [name] DOB: Aug 16, 1928 MRN [id number]  Date of Service: 01/10/2020  Complaints/HPI: Pt is here reporting 2 weeks of increasing productive cough/ sob.  She is on oxygen continually at 3 lpm.  No taking symbicort due to cost. She continues to take medication for her GERD.  Will try nexium at this time for increased symptoms.    ROS  General: (-) fever, (-) chills, (-) night sweats, (-) weakness Skin: (-) rashes, (-) itching,. Eyes: (-) visual changes, (-) redness, (-) itching. Nose and Sinuses: (-) nasal stuffiness or itchiness, (-) postnasal drip, (-) nosebleeds, (-) sinus trouble. Mouth and Throat: (-) sore throat, (-) hoarseness. Neck: (-) swollen glands, (-) enlarged thyroid, (-) neck pain. Respiratory: +cough, (-) bloody sputum, +shortness of breath, - wheezing. Cardiovascular: - ankle swelling, (-) chest pain. Lymphatic: (-) lymph node enlargement. Neurologic: (-) numbness, (-) tingling. Psychiatric: (-) anxiety, (-) depression   Current Medication: Outpatient Encounter Medications as of 01/05/2020  Medication Sig  . aspirin EC 81 MG EC tablet Take 1 tablet (81 mg total) by mouth daily.  . chlorpheniramine-HYDROcodone (TUSSIONEX PENNKINETIC ER) 10-8 MG/5ML SUER Take 5 mLs by mouth daily.  . Cholecalciferol (VITAMIN D3) 5000 units TABS Take 5,000 Units by mouth daily.  Marland Kitchen gabapentin (NEURONTIN) 100 MG capsule Take 100 mg by mouth daily.  Marland Kitchen guaiFENesin (MUCINEX) 600 MG 12 hr tablet Take by mouth 2 (two) times daily.  Marland Kitchen HUMULIN 70/30 KWIKPEN (70-30) 100 UNIT/ML PEN Inject 20 Units into the skin 2 (two) times daily. (Patient taking differently: Inject 35 Units into the skin 2 (two) times daily. )  . hydrOXYzine (ATARAX/VISTARIL) 25 MG tablet Take 25 mg by mouth 4 (four) times daily as needed for itching.  . lactobacillus acidophilus (BACID) TABS tablet  Take 2 tablets by mouth daily.  Marland Kitchen levothyroxine (SYNTHROID, LEVOTHROID) 50 MCG tablet Take 1 tablet (50 mcg total) by mouth daily. PATIENT NEEDS TO SCHEDULE OFFICE VISIT FOR FOLLOW UP  . metoprolol succinate (TOPROL-XL) 100 MG 24 hr tablet Take 1 tablet (100 mg total) by mouth daily. Take with or immediately following a meal.  . OXYGEN Inhale 2 L into the lungs.  . predniSONE (DELTASONE) 10 MG tablet Take 1 tablet (10 mg total) by mouth daily with breakfast.  . VENTOLIN HFA 108 (90 Base) MCG/ACT inhaler Inhale 2 puffs into the lungs every 6 (six) hours as needed.  . vitamin B-12 (CYANOCOBALAMIN) 1000 MCG tablet Take 1,000 mcg by mouth daily.  . [DISCONTINUED] azithromycin (ZITHROMAX) 250 MG tablet Take one tab a day for 7 days for uri  . [DISCONTINUED] benzonatate (TESSALON PERLES) 100 MG capsule Take 1 capsule (100 mg total) by mouth every 6 (six) hours as needed for cough.  . [DISCONTINUED] budesonide-formoterol (SYMBICORT) 80-4.5 MCG/ACT inhaler Inhale 1 puff into the lungs 2 (two) times daily.  . [DISCONTINUED] chlorpheniramine-HYDROcodone (TUSSIONEX PENNKINETIC ER) 10-8 MG/5ML SUER Take 5 mLs by mouth daily.  . [DISCONTINUED] guaiFENesin-codeine (ROBITUSSIN AC) 100-10 MG/5ML syrup Take 5 mLs by mouth 3 (three) times daily as needed for cough.  . [DISCONTINUED] pantoprazole (PROTONIX) 40 MG tablet TAKE 1 TABLET(40 MG) BY MOUTH DAILY  . Budeson-Glycopyrrol-Formoterol (BREZTRI AEROSPHERE) 160-9-4.8 MCG/ACT AERO Inhale 2 puffs into the lungs in the morning and at bedtime.  Marland Kitchen esomeprazole (NEXIUM) 40 MG capsule Take 1 capsule (40 mg total) by mouth daily.   No facility-administered encounter medications  on file as of 01/05/2020.    Surgical History: Past Surgical History:  Procedure Laterality Date  . ABDOMINAL HYSTERECTOMY  1970  . BACK SURGERY  2013  . colectomy Right 10/08/2013   Dr. Egbert Garibaldi  . CORONARY ANGIOPLASTY WITH STENT PLACEMENT      Medical History: Past Medical History:   Diagnosis Date  . A-fib (HCC)   . Allergy   . Arthritis   . COPD (chronic obstructive pulmonary disease) (HCC)   . Diabetes mellitus without complication (HCC)   . GERD (gastroesophageal reflux disease)   . Hyperlipidemia   . Hypertension   . Oxygen dependent    2 liters  . Stroke [facility])    5 years ago per daughter  . TIA (transient ischemic attack)     Family History: Family History  Problem Relation Age of Onset  . Breast cancer Mother   . Pancreatitis Father   . Breast cancer Sister   . Lung cancer Brother   . Melanoma Brother   . Throat cancer Brother     Social History: Social History   Socioeconomic History  . Marital status: Widowed    Spouse name: Not on file  . Number of children: 3  . Years of education: H/S  . Highest education level: Not on file  Occupational History  . Occupation: Retired  Tobacco Use  . Smoking status: Former Smoker    Packs/day: 1.00    Years: 30.00    Pack years: 30.00    Quit date: 11/30/1999    Years since quitting: 20.1  . Smokeless tobacco: Never Used  Vaping Use  . Vaping Use: Never used  Substance and Sexual Activity  . Alcohol use: No  . Drug use: No  . Sexual activity: Not on file  Other Topics Concern  . Not on file  Social History Narrative  . Not on file   Social Determinants of Health   Financial Resource Strain:   . Difficulty of Paying Living Expenses:   Food Insecurity:   . Worried About Programme researcher, broadcasting/film/video in the Last Year:   . Barista in the Last Year:   Transportation Needs:   . Freight forwarder (Medical):   Marland Kitchen Lack of Transportation (Non-Medical):   Physical Activity:   . Days of Exercise per Week:   . Minutes of Exercise per Session:   Stress:   . Feeling of Stress :   Social Connections:   . Frequency of Communication with Friends and Family:   . Frequency of Social Gatherings with Friends and Family:   . Attends Religious Services:   . Active Member of Clubs or  Organizations:   . Attends Banker Meetings:   Marland Kitchen Marital Status:   Intimate Partner Violence:   . Fear of Current or Ex-Partner:   . Emotionally Abused:   Marland Kitchen Physically Abused:   . Sexually Abused:     Vital Signs: Blood pressure (!) 170/67, pulse 81, temperature (!) 97.2 F (36.2 C), resp. rate 16, height 5\' 2"  (1.575 m), weight 169 lb 12.8 oz (77 kg), SpO2 98 %.  Examination: General Appearance: The patient is well-developed, well-nourished, and in no distress. Skin: [name] inspection of skin unremarkable. Head: normocephalic, no [name] deformities. Eyes: no [name] deformities noted. ENT: ears appear grossly normal no exudates. Neck: Supple. No thyromegaly. No LAD. Respiratory: clear bilaterally. Cardiovascular: Normal S1 and S2 without murmur or rub. Extremities: No cyanosis. pulses are equal. Neurologic: Alert and oriented.  No involuntary movements.  LABS: No results found for this or any previous visit (from the past 2160 hour(s)).  Radiology: DG Chest 2 View  Result Date: 06/06/2019 CLINICAL DATA:  Productive cough and shortness of breath for 1 week. EXAM: CHEST - 2 VIEW COMPARISON:  05/30/2019 FINDINGS: The cardiomediastinal silhouette is unchanged with normal heart size. Aortic atherosclerosis is noted. There is persistent mild peribronchial thickening. No acute airspace consolidation, edema, pleural effusion, pneumothorax is identified. A subtle subcentimeter left midlung nodular density corresponds to a known nodule in the superior segment of the left lower lobe on CT. There is biapical pleural thickening. No acute osseous abnormality is evident. IMPRESSION: Persistent mild bronchitic changes. Electronically Signed   By: Sebastian Ache M.D.   On: 06/06/2019 15:56    No results found.  No results found.    Assessment and Plan: Patient Active Problem List   Diagnosis Date Noted  . Uncontrolled hypertension 11/22/2018  . Chronic respiratory failure with  hypoxia (HCC) 10/08/2017  . Simple chronic bronchitis (HCC) 10/08/2017  . SOB (shortness of breath) 09/24/2017  . History of stroke 08/17/2017  . Acute respiratory failure with hypoxia and hypercarbia (HCC) 08/07/2017  . Respiratory failure (HCC) 08/07/2017  . History of GI bleed 07/07/2017  . Atrial fibrillation with RVR (HCC) 06/30/2017  . Bilateral carotid artery disease (HCC) 04/14/2017  . Healthcare maintenance 04/14/2017  . Dizziness 04/07/2017  . 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 TIA (transient ischemic attack) 12/10/2016  . CVA (cerebral vascular accident) (HCC) 12/09/2016  . Bilateral arm pain 11/27/2016  . Myocardial infarction (HCC) 07/30/2015  . Urinary frequency 02/09/2015  . RUQ abdominal pain 01/26/2015  . Shortness of breath 01/26/2015  . Anxiety 01/26/2015  . Chronic pruritus 12/21/2014  . Cerebral vascular accident (HCC) 11/16/2014  . Chronic low back pain 11/16/2014  . Adult hypothyroidism 11/15/2014  . Allergic rhinitis 11/15/2014  . Body mass index (BMI) of 28.0-28.9 in adult 11/15/2014  . Arthritis 11/15/2014  . Cramps of lower extremity 11/15/2014  . Essential (primary) hypertension 11/15/2014  . Acid reflux 11/15/2014  . Hypercholesteremia 11/15/2014  . Cannot sleep 11/15/2014  . Psoriasis 11/15/2014  . Itch of skin 11/15/2014  . Restless leg 11/15/2014  . Diabetes mellitus, type 2 (HCC) 11/15/2014  . Breath shortness 11/15/2014  . Dermatitis due to unknown cause 04/10/2014  . Arteriosclerosis of coronary artery 02/20/2014  . AF (paroxysmal atrial fibrillation) (HCC) 02/20/2014  . Temporary cerebral vascular dysfunction 02/20/2014  . DD (diverticular disease) 10/05/2013    1. Chronic obstructive pulmonary disease, unspecified COPD type (HCC) Breztri sample given.  Severe disease, continue to use medications as discussed.   2. Chest pain, unspecified type NSR - EKG 12-Lead  3. Gastroesophageal reflux disease without  esophagitis Start nexium as discussed.  - esomeprazole (NEXIUM) 40 MG capsule; Take 1 capsule (40 mg total) by mouth daily.  Dispense: 30 capsule; Refill: 3  4. Oxygen dependent Continue to use oxygen 2-3 lpm as needed.   5. Cough Reviewed risks and possible side effects associated with taking opiates, benzodiazepines and other CNS depressants. Combination of these could cause dizziness and drowsiness. Advised patient not to drive or operate machinery when taking these medications, as patient's and other's life can be at risk and will have consequences. Patient verbalized understanding in this matter. Dependence and abuse for these drugs will be monitored closely. A Controlled substance policy and procedure is on file which allows [facility] to order  a urine drug screen test at any visit. Patient understands and agrees with the plan - chlorpheniramine-HYDROcodone (TUSSIONEX PENNKINETIC ER) 10-8 MG/5ML SUER; Take 5 mLs by mouth daily.  Dispense: 100 mL; Refill: 0  General Counseling: I have discussed the findings of the evaluation and examination with Peggy [name].  I have also discussed any further diagnostic evaluation thatmay be needed or ordered today. Peggy [name] verbalizes understanding of the findings of todays visit. We also reviewed her medications today and discussed drug interactions and side effects including but not limited excessive drowsiness and altered mental states. We also discussed that there is always a risk not just to her but also people around her. she has been encouraged to call the office with any questions or concerns that should arise related to todays visit.  Orders Placed This Encounter  Procedures  . EKG 12-Lead     Time spent: 30 This patient was seen by Blima Ledger AGNP-C in Collaboration with Dr. Freda Munro as a part of collaborative care agreement.   I have personally obtained a history, examined the patient, evaluated laboratory and imaging results, formulated  the assessment and plan and placed orders.    Yevonne Pax, MD [facility] Pulmonary and Critical Care Sleep medicine

## 2020-01-26 ENCOUNTER — Encounter: Payer: Self-pay | Admitting: Internal Medicine

## 2020-01-26 ENCOUNTER — Other Ambulatory Visit: Payer: Self-pay

## 2020-01-26 ENCOUNTER — Ambulatory Visit
Admission: RE | Admit: 2020-01-26 | Discharge: 2020-01-26 | Disposition: A | Discharge status: Home / Self Care | Payer: Medicare Other | Attending: Hospice and Palliative Medicine | Admitting: Hospice and Palliative Medicine

## 2020-01-26 ENCOUNTER — Ambulatory Visit
Admission: RE | Admit: 2020-01-26 | Discharge: 2020-01-26 | Disposition: A | Discharge status: Home / Self Care | Payer: Medicare Other | Source: Ambulatory Visit | Attending: Hospice and Palliative Medicine | Admitting: Hospice and Palliative Medicine

## 2020-01-26 ENCOUNTER — Ambulatory Visit: Payer: Medicare Other | Admitting: Internal Medicine

## 2020-01-26 ENCOUNTER — Other Ambulatory Visit: Payer: Self-pay | Admitting: Hospice and Palliative Medicine

## 2020-01-26 ENCOUNTER — Ambulatory Visit: Admission: RE | Admit: 2020-01-26 | Payer: Medicare Other | Source: Ambulatory Visit

## 2020-01-26 DIAGNOSIS — J440 Chronic obstructive pulmonary disease with acute lower respiratory infection: Secondary | ICD-10-CM | POA: Diagnosis not present

## 2020-01-26 DIAGNOSIS — R05 Cough: Secondary | ICD-10-CM

## 2020-01-26 DIAGNOSIS — R0602 Shortness of breath: Secondary | ICD-10-CM

## 2020-01-26 DIAGNOSIS — R059 Cough, unspecified: Secondary | ICD-10-CM

## 2020-01-26 MED ORDER — AZITHROMYCIN 250 MG PO TABS: ORAL_TABLET | ORAL | 0 refills | Status: DC

## 2020-01-26 NOTE — Progress Notes (Signed)
[facility] [address] [place], [place] [address]  Pulmonary Sleep Medicine   Office Visit Note  Patient Name: Peggy [name] DOB: 1928-11-05 MRN [id number]  Date of Service: 01/27/2020  Complaints/HPI: Patient is here for routine pulmonary follow-up. She continues to have shortness of breath and productive cough that has seemed to be exacerbated for the last several weeks. Last night she woke up complaining of chest tightness followed by coughing that produced a hard small ball of sputum. She is currently wearing 3-4LPM via [place] of supplemental oxygen at all times. She has been using Breztri sample inhalers daily, had to stop Symbicort due to the increased cost.  ROS  General: (-) fever, (-) chills, (-) night sweats, (-) weakness Skin: (-) rashes, (-) itching,. Eyes: (-) visual changes, (-) redness, (-) itching. Nose and Sinuses: (-) nasal stuffiness or itchiness, (-) postnasal drip, (-) nosebleeds, (-) sinus trouble. Mouth and Throat: (-) sore throat, (-) hoarseness. Neck: (-) swollen glands, (-) enlarged thyroid, (-) neck pain. Respiratory: + cough, (-) bloody sputum, + shortness of breath, - wheezing. Cardiovascular: - ankle swelling, (-) chest pain. Lymphatic: (-) lymph node enlargement. Neurologic: (-) numbness, (-) tingling. Psychiatric: (-) anxiety, (-) depression   Current Medication: Outpatient Encounter Medications as of 01/26/2020  Medication Sig  . aspirin EC 81 MG EC tablet Take 1 tablet (81 mg total) by mouth daily.  . Budeson-Glycopyrrol-Formoterol (BREZTRI AEROSPHERE) 160-9-4.8 MCG/ACT AERO Inhale 2 puffs into the lungs in the morning and at bedtime.  . Cholecalciferol (VITAMIN D3) 5000 units TABS Take 5,000 Units by mouth daily.  Marland Kitchen esomeprazole (NEXIUM) 40 MG capsule Take 1 capsule (40 mg total) by mouth daily.  Marland Kitchen gabapentin (NEURONTIN) 100 MG capsule Take 100 mg by mouth daily.  Marland Kitchen guaiFENesin (MUCINEX) 600 MG 12 hr tablet Take by mouth 2 (two) times  daily.  Marland Kitchen HUMULIN 70/30 KWIKPEN (70-30) 100 UNIT/ML PEN Inject 20 Units into the skin 2 (two) times daily. (Patient taking differently: Inject 35 Units into the skin 2 (two) times daily. )  . hydrOXYzine (ATARAX/VISTARIL) 25 MG tablet Take 25 mg by mouth 4 (four) times daily as needed for itching.  . lactobacillus acidophilus (BACID) TABS tablet Take 2 tablets by mouth daily.  Marland Kitchen levothyroxine (SYNTHROID, LEVOTHROID) 50 MCG tablet Take 1 tablet (50 mcg total) by mouth daily. PATIENT NEEDS TO SCHEDULE OFFICE VISIT FOR FOLLOW UP  . metoprolol succinate (TOPROL-XL) 100 MG 24 hr tablet Take 1 tablet (100 mg total) by mouth daily. Take with or immediately following a meal.  . OXYGEN Inhale 2 L into the lungs.  . VENTOLIN HFA 108 (90 Base) MCG/ACT inhaler Inhale 2 puffs into the lungs every 6 (six) hours as needed.  . vitamin B-12 (CYANOCOBALAMIN) 1000 MCG tablet Take 1,000 mcg by mouth daily.  Marland Kitchen azithromycin (ZITHROMAX) 250 MG tablet Take one tab po qd for 7 days  . chlorpheniramine-HYDROcodone (TUSSIONEX PENNKINETIC ER) 10-8 MG/5ML SUER Take 5 mLs by mouth daily. (Patient not taking: Reported on 01/26/2020)  . predniSONE (DELTASONE) 10 MG tablet Take 1 tablet (10 mg total) by mouth daily with breakfast. (Patient not taking: Reported on 01/26/2020)  . trospium (SANCTURA) 20 MG tablet Take 20 mg by mouth 2 (two) times daily.   No facility-administered encounter medications on file as of 01/26/2020.    Surgical History: Past Surgical History:  Procedure Laterality Date  . ABDOMINAL HYSTERECTOMY  1970  . BACK SURGERY  2013  . colectomy Right 10/08/2013   Dr. Egbert Garibaldi  .  CORONARY ANGIOPLASTY WITH STENT PLACEMENT      Medical History: Past Medical History:  Diagnosis Date  . A-fib (HCC)   . Allergy   . Arthritis   . COPD (chronic obstructive pulmonary disease) (HCC)   . Diabetes mellitus without complication (HCC)   . GERD (gastroesophageal reflux disease)   . Hyperlipidemia   . Hypertension   .  Oxygen dependent    2 liters  . Stroke [facility])    5 years ago per daughter  . TIA (transient ischemic attack)     Family History: Family History  Problem Relation Age of Onset  . Breast cancer Mother   . Pancreatitis Father   . Breast cancer Sister   . Lung cancer Brother   . Melanoma Brother   . Throat cancer Brother     Social History: Social History   Socioeconomic History  . Marital status: Widowed    Spouse name: Not on file  . Number of children: 3  . Years of education: H/S  . Highest education level: Not on file  Occupational History  . Occupation: Retired  Tobacco Use  . Smoking status: Former Smoker    Packs/day: 1.00    Years: 30.00    Pack years: 30.00    Quit date: 11/30/1999    Years since quitting: 20.1  . Smokeless tobacco: Never Used  Vaping Use  . Vaping Use: Never used  Substance and Sexual Activity  . Alcohol use: No  . Drug use: No  . Sexual activity: Not on file  Other Topics Concern  . Not on file  Social History Narrative  . Not on file   Social Determinants of Health   Financial Resource Strain:   . Difficulty of Paying Living Expenses: Not on file  Food Insecurity:   . Worried About Programme researcher, broadcasting/film/video in the Last Year: Not on file  . Ran Out of Food in the Last Year: Not on file  Transportation Needs:   . Lack of Transportation (Medical): Not on file  . Lack of Transportation (Non-Medical): Not on file  Physical Activity:   . Days of Exercise per Week: Not on file  . Minutes of Exercise per Session: Not on file  Stress:   . Feeling of Stress : Not on file  Social Connections:   . Frequency of Communication with Friends and Family: Not on file  . Frequency of Social Gatherings with Friends and Family: Not on file  . Attends Religious Services: Not on file  . Active Member of Clubs or Organizations: Not on file  . Attends Banker Meetings: Not on file  . Marital Status: Not on file  Intimate Partner Violence:    . Fear of Current or Ex-Partner: Not on file  . Emotionally Abused: Not on file  . Physically Abused: Not on file  . Sexually Abused: Not on file    Vital Signs: Blood pressure (!) 155/63, pulse 92, temperature (!) 97.4 F (36.3 C), resp. rate 16, height 5\' 2"  (1.575 m), weight 168 lb (76.2 kg), SpO2 97 %.  Examination: General Appearance: The patient is well-developed, well-nourished, and in no distress. Skin: Gross inspection of skin unremarkable. Head: normocephalic, no gross deformities. Eyes: no gross deformities noted. ENT: ears appear grossly normal no exudates. Neck: Supple. No thyromegaly. No LAD. Respiratory: Diminished wheezing throughout. Shortness of breath noted while sitting in exam room. Cardiovascular: Normal S1 and S2 without murmur or rub. Extremities: No cyanosis. pulses are equal.  Neurologic: Alert and oriented. No involuntary movements.  LABS: No results found for this or any previous visit (from the past 2160 hour(s)).  Radiology: DG Chest 2 View  Result Date: 06/06/2019 CLINICAL DATA:  Productive cough and shortness of breath for 1 week. EXAM: CHEST - 2 VIEW COMPARISON:  05/30/2019 FINDINGS: The cardiomediastinal silhouette is unchanged with normal heart size. Aortic atherosclerosis is noted. There is persistent mild peribronchial thickening. No acute airspace consolidation, edema, pleural effusion, pneumothorax is identified. A subtle subcentimeter left midlung nodular density corresponds to a known nodule in the superior segment of the left lower lobe on CT. There is biapical pleural thickening. No acute osseous abnormality is evident. IMPRESSION: Persistent mild bronchitic changes. Electronically Signed   By: Sebastian Ache M.D.   On: 06/06/2019 15:56    Assessment and Plan: Patient Active Problem List   Diagnosis Date Noted  . Uncontrolled hypertension 11/22/2018  . Chronic respiratory failure with hypoxia (HCC) 10/08/2017  . Simple chronic bronchitis  (HCC) 10/08/2017  . SOB (shortness of breath) 09/24/2017  . History of stroke 08/17/2017  . Acute respiratory failure with hypoxia and hypercarbia (HCC) 08/07/2017  . Respiratory failure (HCC) 08/07/2017  . History of GI bleed 07/07/2017  . Atrial fibrillation with RVR (HCC) 06/30/2017  . Bilateral carotid artery disease (HCC) 04/14/2017  . Healthcare maintenance 04/14/2017  . Dizziness 04/07/2017  . 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 TIA (transient ischemic attack) 12/10/2016  . CVA (cerebral vascular accident) (HCC) 12/09/2016  . Bilateral arm pain 11/27/2016  . Myocardial infarction (HCC) 07/30/2015  . Urinary frequency 02/09/2015  . RUQ abdominal pain 01/26/2015  . Shortness of breath 01/26/2015  . Anxiety 01/26/2015  . Chronic pruritus 12/21/2014  . Cerebral vascular accident (HCC) 11/16/2014  . Chronic low back pain 11/16/2014  . Adult hypothyroidism 11/15/2014  . Allergic rhinitis 11/15/2014  . Body mass index (BMI) of 28.0-28.9 in adult 11/15/2014  . Arthritis 11/15/2014  . Cramps of lower extremity 11/15/2014  . Essential (primary) hypertension 11/15/2014  . Acid reflux 11/15/2014  . Hypercholesteremia 11/15/2014  . Cannot sleep 11/15/2014  . Psoriasis 11/15/2014  . Itch of skin 11/15/2014  . Restless leg 11/15/2014  . Diabetes mellitus, type 2 (HCC) 11/15/2014  . Breath shortness 11/15/2014  . Dermatitis due to unknown cause 04/10/2014  . Arteriosclerosis of coronary artery 02/20/2014  . AF (paroxysmal atrial fibrillation) (HCC) 02/20/2014  . Temporary cerebral vascular dysfunction 02/20/2014  . DD (diverticular disease) 10/05/2013    1. Chronic obstructive pulmonary disease with acute lower respiratory infection (HCC) Spirometry stable today. Complaints of increased shortness of breath, also noted on physical exam. Reports of productive sputum at night causes chest pressure. Will treat with Zithromax and follow-up closely for improvement  in symptoms. CXR to assess for acute pulmonary infection. Advised to use codeine cough medication sparingly. - azithromycin (ZITHROMAX) 250 MG tablet; Take one tab po qd for 7 days  Dispense: 7 tablet; Refill: 0 - DG Chest 2 View  2. SOB (shortness of breath) Stable spirometry today. - Spirometry with Graph  3. Cough Will review CXR to acute pulmonary disease. - DG Chest 2 View  General Counseling: I have discussed the findings of the evaluation and examination with Joyce Gross.  I have also discussed any further diagnostic evaluation thatmay be needed or ordered today. Addisyn verbalizes understanding of the findings of todays visit. We also reviewed her medications today and discussed drug interactions and side effects including but not  limited excessive drowsiness and altered mental states. We also discussed that there is always a risk not just to her but also people around her. she has been encouraged to call the office with any questions or concerns that should arise related to todays visit.  Orders Placed This Encounter  Procedures  . Spirometry with Graph    Order Specific Question:   Where should this test be performed?    Answer:   [facility]     Time spent: 8  I have personally obtained a history, examined the patient, evaluated laboratory and imaging results, formulated the assessment and plan and placed orders. This patient was seen by Brent General AGNP-C in Collaboration with Dr. Freda Munro as a part of collaborative care agreement.    Yevonne Pax, MD [facility] Pulmonary and Critical Care Sleep medicine

## 2020-01-27 NOTE — Patient Instructions (Signed)

## 2020-01-29 NOTE — Addendum Note (Signed)
Addended by: Freda Munro on: 01/29/2020 01:13 PM   Modules accepted: Level of Service

## 2020-01-30 ENCOUNTER — Other Ambulatory Visit: Payer: Self-pay

## 2020-01-30 ENCOUNTER — Telehealth: Payer: Self-pay | Admitting: Internal Medicine

## 2020-01-30 ENCOUNTER — Telehealth: Payer: Self-pay

## 2020-01-30 MED ORDER — PREDNISONE 20 MG PO TABS
ORAL_TABLET | ORAL | 0 refills | Status: DC
Start: 2020-01-30 — End: 2020-03-16

## 2020-01-30 MED ORDER — HYDROCOD POLST-CPM POLST ER 10-8 MG/5ML PO SUER: 5.0000 mL | Freq: Two times a day (BID) | ORAL | 0 refills | Status: DC | PRN

## 2020-01-30 MED ORDER — FLUTICASONE-SALMETEROL 250-50 MCG/DOSE IN AEPB: INHALATION_SPRAY | RESPIRATORY_TRACT | 1 refills | Status: DC

## 2020-01-30 NOTE — Telephone Encounter (Signed)
done

## 2020-01-30 NOTE — Telephone Encounter (Signed)
Please see this // call

## 2020-01-30 NOTE — Telephone Encounter (Signed)
Please send in [email or web address]

## 2020-01-30 NOTE — Telephone Encounter (Signed)
Daughter Evangeline Dakin called stating that pt was still having a cough and was not to much better about the same for 3 weeks now.  Having trouble sleeping at night die to the cough.  Per DFK pt told to stop the Breztri due to it can make you cough more.  Pt has not been tested for Covid and daughter not sure if pt has had fevers. Pt has been very weak also.  Daughter was asked to make appt for video/phone visit but due to schedule difficulties daughter didn't want to schedule for what appts we had available.  Per DFK I sent in prednisone 20 mg take one tab foe 3 days then take one tab one day.  And also sent advair 250 1 puff 2 times a day and DFK sent in tussinex to [facility] in [place].  Daughter also told that pt's xray was normal.  dbs

## 2020-02-01 ENCOUNTER — Ambulatory Visit: Payer: Medicare Other | Admitting: Adult Health

## 2020-02-08 ENCOUNTER — Ambulatory Visit: Payer: Medicare Other | Admitting: Internal Medicine

## 2020-02-08 ENCOUNTER — Encounter: Payer: Self-pay | Admitting: Internal Medicine

## 2020-02-08 DIAGNOSIS — R05 Cough: Secondary | ICD-10-CM | POA: Diagnosis not present

## 2020-02-08 DIAGNOSIS — R059 Cough, unspecified: Secondary | ICD-10-CM

## 2020-02-08 DIAGNOSIS — Z9981 Dependence on supplemental oxygen: Secondary | ICD-10-CM | POA: Diagnosis not present

## 2020-02-08 DIAGNOSIS — J449 Chronic obstructive pulmonary disease, unspecified: Secondary | ICD-10-CM

## 2020-02-08 NOTE — Progress Notes (Signed)
[facility] [address] [place], [place] [address]  Internal MEDICINE  Telephone Visit  Patient Name: Peggy [name]  [id number]  [id number]  Date of Service: 02/09/2020  I connected with the patient at 1549 by telephone and verified the patients identity using two identifiers.   I discussed the limitations, risks, security and privacy concerns of performing an evaluation and management service by telephone and the availability of in person appointments. I also discussed with the patient that there may be a patient responsible charge related to the service.  The patient expressed understanding and agrees to proceed.    Chief Complaint  Patient presents with  . Follow-up    2 weeks follow up   . Telephone Screen    [phone]  . Telephone Assessment    phone call  . COPD    HPI Being seen today for pulmonary follow-up. At last pulmonary visit, 8/26, she was complaining of chest tightness and productive cough and well as increased shortness of breath. Sent for CXR, no acute findings, chronic bronchitic changes. Today, she is feeling much better. Her breathing is reportedly back to her baseline. She has completed a course of azithromycin and prednisone. She continues to use 3LPM via  supplemental oxygen at all times.  Current Medication: Outpatient Encounter Medications as of 02/08/2020  Medication Sig  . aspirin EC 81 MG EC tablet Take 1 tablet (81 mg total) by mouth daily.  Marland Kitchen azithromycin (ZITHROMAX) 250 MG tablet Take one tab po qd for 7 days  . Budeson-Glycopyrrol-Formoterol (BREZTRI AEROSPHERE) 160-9-4.8 MCG/ACT AERO Inhale 2 puffs into the lungs in the morning and at bedtime.  . chlorpheniramine-HYDROcodone (TUSSIONEX PENNKINETIC ER) 10-8 MG/5ML SUER Take 5 mLs by mouth every 12 (twelve) hours as needed for cough.  . Cholecalciferol (VITAMIN D3) 5000 units TABS Take 5,000 Units by mouth daily.  Marland Kitchen esomeprazole (NEXIUM) 40 MG capsule Take 1 capsule (40 mg total) by  mouth daily.  . Fluticasone-Salmeterol (ADVAIR DISKUS) 250-50 MCG/DOSE AEPB 1 puff twice a day  . gabapentin (NEURONTIN) 100 MG capsule Take 100 mg by mouth daily.  Marland Kitchen guaiFENesin (MUCINEX) 600 MG 12 hr tablet Take by mouth 2 (two) times daily.  Marland Kitchen HUMULIN 70/30 KWIKPEN (70-30) 100 UNIT/ML PEN Inject 20 Units into the skin 2 (two) times daily. (Patient taking differently: Inject 35 Units into the skin 2 (two) times daily. )  . hydrOXYzine (ATARAX/VISTARIL) 25 MG tablet Take 25 mg by mouth 4 (four) times daily as needed for itching.  . lactobacillus acidophilus (BACID) TABS tablet Take 2 tablets by mouth daily.  Marland Kitchen levothyroxine (SYNTHROID, LEVOTHROID) 50 MCG tablet Take 1 tablet (50 mcg total) by mouth daily. PATIENT NEEDS TO SCHEDULE OFFICE VISIT FOR FOLLOW UP  . metoprolol succinate (TOPROL-XL) 100 MG 24 hr tablet Take 1 tablet (100 mg total) by mouth daily. Take with or immediately following a meal.  . OXYGEN Inhale 2 L into the lungs.  . predniSONE (DELTASONE) 20 MG tablet Take one tablet (20 mg) BID for 3 days then take one 20 mg tablet daily  . trospium (SANCTURA) 20 MG tablet Take 20 mg by mouth 2 (two) times daily.  . VENTOLIN HFA 108 (90 Base) MCG/ACT inhaler Inhale 2 puffs into the lungs every 6 (six) hours as needed.  . vitamin B-12 (CYANOCOBALAMIN) 1000 MCG tablet Take 1,000 mcg by mouth daily.   No facility-administered encounter medications on file as of 02/08/2020.    Surgical History: Past Surgical History:  Procedure Laterality  Date  . ABDOMINAL HYSTERECTOMY  1970  . BACK SURGERY  2013  . colectomy Right 10/08/2013   Dr. Egbert Garibaldi  . CORONARY ANGIOPLASTY WITH STENT PLACEMENT      Medical History: Past Medical History:  Diagnosis Date  . A-fib (HCC)   . Allergy   . Arthritis   . COPD (chronic obstructive pulmonary disease) (HCC)   . Diabetes mellitus without complication (HCC)   . GERD (gastroesophageal reflux disease)   . Hyperlipidemia   . Hypertension   . Oxygen  dependent    2 liters  . Stroke [facility])    5 years ago per daughter  . TIA (transient ischemic attack)     Family History: Family History  Problem Relation Age of Onset  . Breast cancer Mother   . Pancreatitis Father   . Breast cancer Sister   . Lung cancer Brother   . Melanoma Brother   . Throat cancer Brother     Social History   Socioeconomic History  . Marital status: Widowed    Spouse name: Not on file  . Number of children: 3  . Years of education: H/S  . Highest education level: Not on file  Occupational History  . Occupation: Retired  Tobacco Use  . Smoking status: Former Smoker    Packs/day: 1.00    Years: 30.00    Pack years: 30.00    Quit date: 11/30/1999    Years since quitting: 20.2  . Smokeless tobacco: Never Used  Vaping Use  . Vaping Use: Never used  Substance and Sexual Activity  . Alcohol use: No  . Drug use: No  . Sexual activity: Not on file  Other Topics Concern  . Not on file  Social History Narrative  . Not on file   Social Determinants of Health   Financial Resource Strain:   . Difficulty of Paying Living Expenses: Not on file  Food Insecurity:   . Worried About Programme researcher, broadcasting/film/video in the Last Year: Not on file  . Ran Out of Food in the Last Year: Not on file  Transportation Needs:   . Lack of Transportation (Medical): Not on file  . Lack of Transportation (Non-Medical): Not on file  Physical Activity:   . Days of Exercise per Week: Not on file  . Minutes of Exercise per Session: Not on file  Stress:   . Feeling of Stress : Not on file  Social Connections:   . Frequency of Communication with Friends and Family: Not on file  . Frequency of Social Gatherings with Friends and Family: Not on file  . Attends Religious Services: Not on file  . Active Member of Clubs or Organizations: Not on file  . Attends Banker Meetings: Not on file  . Marital Status: Not on file  Intimate Partner Violence:   . Fear of Current or  Ex-Partner: Not on file  . Emotionally Abused: Not on file  . Physically Abused: Not on file  . Sexually Abused: Not on file   Review of Systems  Constitutional: Negative for activity change, chills, fatigue and fever.  HENT: Negative for congestion, postnasal drip, sinus pressure, sinus pain and sore throat.   Respiratory: Negative for cough, chest tightness, shortness of breath and wheezing.   Cardiovascular: Negative for chest pain, palpitations and leg swelling.  Neurological: Negative for tremors, weakness and headaches.    Vital Signs: Pulse 64   Resp 16   Ht 5\' 2"  (1.575 m)  Wt 169 lb (76.7 kg)   SpO2 99%   BMI 30.91 kg/m    Observation/Objective: No acute distress noted.  Assessment/Plan: 1. Chronic obstructive pulmonary disease, unspecified COPD type (HCC) Exacerbation has resolved at this time, reports her breathing is back to baseline. At this time continue with Breztri inhalers and routine monitoring.  2. Oxygen dependent Continue with 3LPM via [place] continuous supplemental oxygen at this time, continue with routinely monitoring.  3. Cough Cough has resolved. No evidence of acute abnormalities on CXR.  General Counseling: ilanna deihl understanding of the findings of today's phone visit and agrees with plan of treatment. I have discussed any further diagnostic evaluation that may be needed or ordered today. We also reviewed her medications today. she has been encouraged to call the office with any questions or concerns that should arise related to todays visit.   Time spent: 25 Minutes Time spent includes review of chart, medications, test results and follow-up plan with the patient.  Lubertha Basque Harris AGNP-C Internal medicine

## 2020-02-09 ENCOUNTER — Telehealth: Payer: Self-pay | Admitting: Internal Medicine

## 2020-02-09 ENCOUNTER — Encounter: Payer: Self-pay | Admitting: Internal Medicine

## 2020-02-14 ENCOUNTER — Telehealth: Payer: Self-pay

## 2020-02-14 ENCOUNTER — Other Ambulatory Visit: Payer: Self-pay

## 2020-02-14 MED ORDER — BUDESONIDE-FORMOTEROL FUMARATE 80-4.5 MCG/ACT IN AERO: 2.0000 | INHALATION_SPRAY | Freq: Two times a day (BID) | RESPIRATORY_TRACT | 0 refills | Status: DC

## 2020-02-14 NOTE — Telephone Encounter (Signed)
Spoke to pt's daughter since pt has a difficult time hearing on the phone. Informed her that pt's med was not covered so we changed it to something different (symbicort) and sent it over to the pharmacy.

## 2020-03-16 ENCOUNTER — Other Ambulatory Visit: Payer: Self-pay

## 2020-03-16 ENCOUNTER — Other Ambulatory Visit: Payer: Self-pay | Admitting: Hospice and Palliative Medicine

## 2020-03-16 MED ORDER — PREDNISONE 10 MG PO TABS: ORAL_TABLET | ORAL | 0 refills | Status: DC

## 2020-03-16 MED ORDER — AZITHROMYCIN 250 MG PO TABS
ORAL_TABLET | ORAL | 0 refills | Status: DC
Start: 2020-03-16 — End: 2020-03-22

## 2020-03-16 MED ORDER — AZITHROMYCIN 250 MG PO TABS: ORAL_TABLET | ORAL | 0 refills | Status: DC

## 2020-03-16 NOTE — Telephone Encounter (Signed)
Called in phar  zithromax and prednisone

## 2020-03-21 ENCOUNTER — Other Ambulatory Visit: Payer: Self-pay | Admitting: Hospice and Palliative Medicine

## 2020-03-21 MED ORDER — HYDROCOD POLST-CPM POLST ER 10-8 MG/5ML PO SUER
5.0000 mL | Freq: Two times a day (BID) | ORAL | 0 refills | Status: DC | PRN
Start: 2020-03-21 — End: 2020-04-01

## 2020-03-22 ENCOUNTER — Encounter: Payer: Self-pay | Admitting: Internal Medicine

## 2020-03-22 ENCOUNTER — Ambulatory Visit
Admission: RE | Admit: 2020-03-22 | Discharge: 2020-03-22 | Disposition: A | Discharge status: Home / Self Care | Payer: Medicare Other | Attending: Internal Medicine | Admitting: Internal Medicine

## 2020-03-22 ENCOUNTER — Ambulatory Visit (INDEPENDENT_AMBULATORY_CARE_PROVIDER_SITE_OTHER): Payer: Medicare Other | Admitting: Internal Medicine

## 2020-03-22 ENCOUNTER — Other Ambulatory Visit: Payer: Self-pay

## 2020-03-22 ENCOUNTER — Ambulatory Visit
Admission: RE | Admit: 2020-03-22 | Discharge: 2020-03-22 | Disposition: A | Discharge status: Home / Self Care | Payer: Medicare Other | Source: Ambulatory Visit | Attending: Internal Medicine | Admitting: Internal Medicine

## 2020-03-22 VITALS — BP 138/68 | HR 89 | Temp 98.4°F | Resp 16 | Ht 62.0 in | Wt 174.8 lb

## 2020-03-22 DIAGNOSIS — J449 Chronic obstructive pulmonary disease, unspecified: Secondary | ICD-10-CM

## 2020-03-22 DIAGNOSIS — J9611 Chronic respiratory failure with hypoxia: Secondary | ICD-10-CM | POA: Diagnosis not present

## 2020-03-22 DIAGNOSIS — R059 Cough, unspecified: Secondary | ICD-10-CM | POA: Diagnosis not present

## 2020-03-22 DIAGNOSIS — Z9981 Dependence on supplemental oxygen: Secondary | ICD-10-CM

## 2020-03-22 DIAGNOSIS — J441 Chronic obstructive pulmonary disease with (acute) exacerbation: Secondary | ICD-10-CM

## 2020-03-22 MED ORDER — PREDNISONE 10 MG PO TABS: 10.0000 mg | ORAL_TABLET | Freq: Every day | ORAL | 0 refills | Status: DC

## 2020-03-22 MED ORDER — LEVOFLOXACIN 500 MG PO TABS: 500.0000 mg | ORAL_TABLET | Freq: Every day | ORAL | 0 refills | Status: DC

## 2020-03-22 NOTE — Patient Instructions (Signed)
Chronic Bronchitis, Adult Chronic bronchitis is long-lasting inflammation of the tubes that carry air into your lungs (bronchial tubes). This is inflammation that occurs:  On most days of the week.  For at least three months at a time.  Over a period of two years in a row. When the bronchial tubes are inflamed, they start to produce mucus. The inflammation and buildup of mucus make it more difficult to breathe. Chronic bronchitis is usually a permanent problem. It is one type of chronic obstructive pulmonary disease (COPD). People with chronic bronchitis are more likely to get frequent colds or respiratory infections. What are the causes? Chronic bronchitis most often occurs in people who:  Have chronic, severe asthma.  Have a history of smoking.  Have asthma and smoke.  Have certain lung diseases.  Have had long-term exposure to certain irritating fumes or chemicals. What are the signs or symptoms? Symptoms of chronic bronchitis may include:  A cough that brings up mucus (productive cough).  Shortness of breath.  Loud breathing (wheezing).  Chest discomfort.  Frequent (recurring) colds or respiratory infections. Certain things can trigger chronic bronchitis symptoms or make them worse, such as:  Infections.  Stopping certain medicines.  Smoking.  Exposure to chemicals. How is this diagnosed? This condition may be diagnosed based on:  Your symptoms and medical history.  A physical exam.  A chest X-ray.  Lung (pulmonary) function tests. How is this treated? There is no cure for chronic bronchitis, but treatment can help control your symptoms. Treatment may include:  Using a cool mist vaporizer or humidifier to make it easier to breathe.  Drinking more fluids. Drinking more makes your mucus thinner, which may make it easier to breathe.  Lifestyle changes, such as eating a healthier diet and getting more exercise.  Medicines, such as: ? Inhalers to improve  air flow in and out of your lungs. ? Antibiotics to treat any bacterial infections you have, such as:  Lung infection (pneumonia).  Sinus infection.  A sudden, severe (acute) episode of bronchitis.  Oxygen therapy.  Preventing infections by keeping up to date on vaccinations, including the pneumonia and flu vaccines.  Pulmonary rehabilitation. This is a program that helps you manage your breathing problems and improve your quality of life. It may last for up to 4-12 weeks and may include exercise programs, education, counseling, and treatment support. Follow these instructions at home: Medicines  Take over-the-counter and prescription medicines only as told by your health care provider.  If you were prescribed an antibiotic medicine, take it as told by your health care provider. Do not stop taking the antibiotic even if you start to feel better. Preventing infections  Get vaccinations as told by your health care provider. Make sure you get a flu shot (influenza vaccine) every year.  Wash your hands often with soap and water. If soap and water are not available, use hand sanitizer.  Avoid contact with people who have symptoms of a cold or the flu. Managing symptoms   Do not smoke, and avoid secondhand smoke. Exposure to cigarette smoke or irritating chemicals will make bronchitis worse. If you smoke and you need help quitting, ask your health care provider. Quitting smoking will help your lungs heal faster.  Use an inhaler, cool mist vaporizer, or humidifier as told by your health care provider.  Avoid pollen, dust, animal dander, molds, smoke, and other things that cause shortness of breath or wheezing attacks.  Use oxygen therapy at home as directed. Follow   instructions from your health care provider about how to use oxygen safely and take precautions to prevent fire. Make sure you never smoke while using oxygen or allow others to smoke in your home.  Do not wait to get medical  care if you have any concerning symptoms or trouble breathing. Waiting could cause permanent injury and may be life threatening. General instructions  Talk with your health care provider about what activities are safe for you and about possible exercise routines. Regular exercise is very important to help you feel better.  Drink enough fluids to keep your urine pale yellow.  Keep all follow-up visits as told by your health care provider. This is important. Contact a health care provider if:  You have coughing or shortness of breath that gets worse.  You have muscle aches.  You have chest pain.  Your mucus seems to get thicker.  Your mucus changes from clear or white to yellow, green, gray, or bloody. Get help right away if:  Your usual medicines do not stop your wheezing.  You have severe difficulty breathing. These symptoms may represent a serious problem that is an emergency. Do not wait to see if the symptoms will go away. Get medical help right away. Call your local emergency services (911 in the [place] Do not drive yourself to the hospital. Summary  Chronic bronchitis is long-lasting inflammation of the tubes that carry air into your lungs (bronchial tubes).  Chronic bronchitis is usually a permanent problem. It is one type of chronic obstructive pulmonary disease (COPD).  There is no cure for chronic bronchitis, but treatment can help control your symptoms.  Do not smoke, and avoid secondhand smoke. Exposure to cigarette smoke or irritating chemicals will make bronchitis worse. This information is not intended to replace advice given to you by your health care provider. Make sure you discuss any questions you have with your health care provider. Document Revised: 03/11/2018 Document Reviewed: 04/08/2017 Elsevier Patient Education  2020 [employer] Inc[employer]  

## 2020-03-22 NOTE — Progress Notes (Signed)
[facility] [address] [place], [place] [address]  Pulmonary Sleep Medicine   Office Visit Note  Patient Name: Peggy [name] DOB: June 14, 1928 MRN [id number]  Date of Service: 03/22/2020  Complaints/HPI: She was seen last time in September for a flare of her COPD> She has been given abx several times and also has been on oral steroids. She is also on oxygen at 4 lpm. She states her sats have in the 90s. She has a cough and has sputum production. She has no hemoptysis. She states she has had occasional chest pain. Patient states that she has no edema at this time. Denies having fevers. Notes headaches. She has been taking codeine cough syrup. Still has a cough  ROS  General: (-) fever, (-) chills, (-) night sweats, (-) weakness Skin: (-) rashes, (-) itching,. Eyes: (-) visual changes, (-) redness, (-) itching. Nose and Sinuses: (-) nasal stuffiness or itchiness, (-) postnasal drip, (-) nosebleeds, (-) sinus trouble. Mouth and Throat: (-) sore throat, (-) hoarseness. Neck: (-) swollen glands, (-) enlarged thyroid, (-) neck pain. Respiratory: + cough, (-) bloody sputum, + shortness of breath, + wheezing. Cardiovascular: - ankle swelling, (-) chest pain. Lymphatic: (-) lymph node enlargement. Neurologic: (-) numbness, (-) tingling. Psychiatric: (-) anxiety, (-) depression   Current Medication: Outpatient Encounter Medications as of 03/22/2020  Medication Sig  . Ascorbic Acid (VITAMIN C WITH ROSE HIPS) 1000 MG tablet Take 1,000 mg by mouth daily.  Marland Kitchen aspirin EC 81 MG EC tablet Take 1 tablet (81 mg total) by mouth daily.  . budesonide-formoterol (SYMBICORT) 80-4.5 MCG/ACT inhaler Inhale 2 puffs into the lungs 2 (two) times daily.  . chlorpheniramine-HYDROcodone (TUSSIONEX PENNKINETIC ER) 10-8 MG/5ML SUER Take 5 mLs by mouth every 12 (twelve) hours as needed for cough.  . Cholecalciferol (VITAMIN D3) 5000 units TABS Take 5,000 Units by mouth daily.  Marland Kitchen esomeprazole (NEXIUM)  40 MG capsule Take 1 capsule (40 mg total) by mouth daily.  Marland Kitchen gabapentin (NEURONTIN) 100 MG capsule Take 100 mg by mouth daily.  Marland Kitchen guaiFENesin (MUCINEX) 600 MG 12 hr tablet Take by mouth 2 (two) times daily.  Marland Kitchen HUMULIN 70/30 KWIKPEN (70-30) 100 UNIT/ML PEN Inject 20 Units into the skin 2 (two) times daily. (Patient taking differently: Inject 35 Units into the skin 2 (two) times daily. )  . hydrOXYzine (ATARAX/VISTARIL) 25 MG tablet Take 25 mg by mouth 4 (four) times daily as needed for itching.  . isosorbide mononitrate (ISMO) 10 MG tablet Take by mouth 2 (two) times daily. Pt not sure about how many mg  . lactobacillus acidophilus (BACID) TABS tablet Take 2 tablets by mouth daily.  Marland Kitchen levothyroxine (SYNTHROID, LEVOTHROID) 50 MCG tablet Take 1 tablet (50 mcg total) by mouth daily. PATIENT NEEDS TO SCHEDULE OFFICE VISIT FOR FOLLOW UP  . metoprolol succinate (TOPROL-XL) 100 MG 24 hr tablet Take 1 tablet (100 mg total) by mouth daily. Take with or immediately following a meal.  . OXYGEN Inhale 2 L into the lungs.  . predniSONE (DELTASONE) 10 MG tablet Take 1 tablet three times a day with a meal for three  Days s, take 1 tablet by twice daily for 3 days with a meal for 2 days, take 1 tablet once daily with a meal for 5 days  . psyllium (REGULOID) 0.52 g capsule Take 0.52 g by mouth daily.  . trospium (SANCTURA) 20 MG tablet Take 20 mg by mouth 2 (two) times daily.  . VENTOLIN HFA 108 (90 Base) MCG/ACT  inhaler Inhale 2 puffs into the lungs every 6 (six) hours as needed.  . vitamin B-12 (CYANOCOBALAMIN) 1000 MCG tablet Take 1,000 mcg by mouth daily.  . [DISCONTINUED] azithromycin (ZITHROMAX) 250 MG tablet Take one tablet daily for 6 days (Patient not taking: Reported on 03/22/2020)   No facility-administered encounter medications on file as of 03/22/2020.    Surgical History: Past Surgical History:  Procedure Laterality Date  . ABDOMINAL HYSTERECTOMY  1970  . BACK SURGERY  2013  . colectomy Right  10/08/2013   Dr. Egbert Garibaldi  . CORONARY ANGIOPLASTY WITH STENT PLACEMENT      Medical History: Past Medical History:  Diagnosis Date  . A-fib (HCC)   . Allergy   . Arthritis   . COPD (chronic obstructive pulmonary disease) (HCC)   . Diabetes mellitus without complication (HCC)   . GERD (gastroesophageal reflux disease)   . Hyperlipidemia   . Hypertension   . Oxygen dependent    2 liters  . Stroke [facility])    5 years ago per daughter  . TIA (transient ischemic attack)     Family History: Family History  Problem Relation Age of Onset  . Breast cancer Mother   . Pancreatitis Father   . Breast cancer Sister   . Lung cancer Brother   . Melanoma Brother   . Throat cancer Brother     Social History: Social History   Socioeconomic History  . Marital status: Widowed    Spouse name: Not on file  . Number of children: 3  . Years of education: H/S  . Highest education level: Not on file  Occupational History  . Occupation: Retired  Tobacco Use  . Smoking status: Former Smoker    Packs/day: 1.00    Years: 30.00    Pack years: 30.00    Quit date: 11/30/1999    Years since quitting: 20.3  . Smokeless tobacco: Never Used  Vaping Use  . Vaping Use: Never used  Substance and Sexual Activity  . Alcohol use: No  . Drug use: No  . Sexual activity: Not on file  Other Topics Concern  . Not on file  Social History Narrative  . Not on file   Social Determinants of Health   Financial Resource Strain:   . Difficulty of Paying Living Expenses: Not on file  Food Insecurity:   . Worried About Programme researcher, broadcasting/film/video in the Last Year: Not on file  . Ran Out of Food in the Last Year: Not on file  Transportation Needs:   . Lack of Transportation (Medical): Not on file  . Lack of Transportation (Non-Medical): Not on file  Physical Activity:   . Days of Exercise per Week: Not on file  . Minutes of Exercise per Session: Not on file  Stress:   . Feeling of Stress : Not on file  Social  Connections:   . Frequency of Communication with Friends and Family: Not on file  . Frequency of Social Gatherings with Friends and Family: Not on file  . Attends Religious Services: Not on file  . Active Member of Clubs or Organizations: Not on file  . Attends Banker Meetings: Not on file  . Marital Status: Not on file  Intimate Partner Violence:   . Fear of Current or Ex-Partner: Not on file  . Emotionally Abused: Not on file  . Physically Abused: Not on file  . Sexually Abused: Not on file    Vital Signs: Blood pressure 138/68, pulse  89, temperature 98.4 F (36.9 C), resp. rate 16, height 5\' 2"  (1.575 m), weight 174 lb 12.8 oz (79.3 kg), SpO2 99 %.  Examination: General Appearance: The patient is well-developed, well-nourished, and in no distress. Skin: Gross inspection of skin unremarkable. Head: normocephalic, no gross deformities. Eyes: no gross deformities noted. ENT: ears appear grossly normal no exudates. Neck: Supple. No thyromegaly. No LAD. Respiratory: few rhochi noted bilaterally. Cardiovascular: Normal S1 and S2 without murmur or rub. Extremities: No cyanosis. pulses are equal. Neurologic: Alert and oriented. No involuntary movements.  LABS: No results found for this or any previous visit (from the past 2160 hour(s)).  Radiology: DG Chest 2 View  Result Date: 01/26/2020 CLINICAL DATA:  Cough and chest pain for several days EXAM: CHEST - 2 VIEW COMPARISON:  06/06/2019 FINDINGS: Cardiac shadow is stable. Aortic calcifications are noted. The lungs are well aerated bilaterally. No focal infiltrate or sizable effusion is seen. Persistent bronchitic changes are again noted. No bony abnormality is seen. IMPRESSION: No acute abnormality noted.  Stable persistent bronchitic markings. Electronically Signed   By: 08/04/2019 M.D.   On: 01/26/2020 21:25    No results found.  No results found.    Assessment and Plan: Patient Active Problem List    Diagnosis Date Noted  . COPD (chronic obstructive pulmonary disease) (HCC) 03/22/2020  . Uncontrolled hypertension 11/22/2018  . Chronic respiratory failure with hypoxia (HCC) 10/08/2017  . Simple chronic bronchitis (HCC) 10/08/2017  . SOB (shortness of breath) 09/24/2017  . History of stroke 08/17/2017  . Acute respiratory failure with hypoxia and hypercarbia (HCC) 08/07/2017  . Respiratory failure (HCC) 08/07/2017  . History of GI bleed 07/07/2017  . Atrial fibrillation with RVR (HCC) 06/30/2017  . Bilateral carotid artery disease (HCC) 04/14/2017  . Healthcare maintenance 04/14/2017  . Dizziness 04/07/2017  . 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 TIA (transient ischemic attack) 12/10/2016  . CVA (cerebral vascular accident) (HCC) 12/09/2016  . Bilateral arm pain 11/27/2016  . Myocardial infarction (HCC) 07/30/2015  . Urinary frequency 02/09/2015  . RUQ abdominal pain 01/26/2015  . Shortness of breath 01/26/2015  . Anxiety 01/26/2015  . Chronic pruritus 12/21/2014  . Cerebral vascular accident (HCC) 11/16/2014  . Chronic low back pain 11/16/2014  . Adult hypothyroidism 11/15/2014  . Allergic rhinitis 11/15/2014  . Body mass index (BMI) of 28.0-28.9 in adult 11/15/2014  . Arthritis 11/15/2014  . Cramps of lower extremity 11/15/2014  . Essential (primary) hypertension 11/15/2014  . Acid reflux 11/15/2014  . Hypercholesteremia 11/15/2014  . Cannot sleep 11/15/2014  . Psoriasis 11/15/2014  . Itch of skin 11/15/2014  . Restless leg 11/15/2014  . Diabetes mellitus, type 2 (HCC) 11/15/2014  . Breath shortness 11/15/2014  . Dermatitis due to unknown cause 04/10/2014  . Arteriosclerosis of coronary artery 02/20/2014  . AF (paroxysmal atrial fibrillation) (HCC) 02/20/2014  . Temporary cerebral vascular dysfunction 02/20/2014  . DD (diverticular disease) 10/05/2013    1. COPD with exacerbation. She has been on azithromycin and has numerous allergies listed.  She has failed outpatient management and I think she will benefit from a short course of IV steroids and abx. It may be better to admit her to the hospital for this if beds available. 2. Chronic respiratory failure on chronic oxygen therapy 3. Cough not resolving suggest follow up CXR also to make certain no pneumonia 4. Oxygen dependent on O2 4 lpm   I called the hospital thereare no beds available. Spoke  with the doctor on call. Best option available at this time is to go to urgent care so she can get IV steroids. Patient will also get a CXR done today. I am going to send a script in for levaquin and steroids. She will watch for side effects. She is not sure if there was a true allergy with cipro   General Counseling: I have discussed the findings of the evaluation and examination with Joyce Gross.  I have also discussed any further diagnostic evaluation thatmay be needed or ordered today. Dennisse verbalizes understanding of the findings of todays visit. We also reviewed her medications today and discussed drug interactions and side effects including but not limited excessive drowsiness and altered mental states. We also discussed that there is always a risk not just to her but also people around her. she has been encouraged to call the office with any questions or concerns that should arise related to todays visit.  Orders Placed This Encounter  Procedures  . Admit to Inpatient (patient's expected length of stay will be greater than 2 midnights or inpatient only procedure)    Order Specific Question:   Hospital Area    Answer:   [facility] [[id number]]    Order Specific Question:   Level of Care    Answer:   Med-Surg [[id number]]    Order Specific Question:   Covid Evaluation    Answer:   Asymptomatic Screening Protocol (No Symptoms)    Order Specific Question:   Diagnosis    Answer:   COPD (chronic obstructive pulmonary disease) (HCC) [811572]    Order Specific Question:   Level of Care     Answer:   Med-Surg [[id number]]    Order Specific Question:   Admitting Physician    Answer:   Yevonne Pax [[id number]]    Order Specific Question:   Estimated length of stay    Answer:   past midnight tomorrow    Order Specific Question:   Certification:    Answer:   I certify this patient will need inpatient services for at least 2 midnights    Order Specific Question:   Attending Physician    Answer:   Yevonne Pax [[id number]]     Time spent:  I have personally obtained a history, examined the patient, evaluated laboratory and imaging results, formulated the assessment and plan and placed orders.    Yevonne Pax, MD [facility] Pulmonary and Critical Care Sleep medicine

## 2020-03-28 ENCOUNTER — Telehealth: Payer: Self-pay

## 2020-03-28 NOTE — Telephone Encounter (Signed)
Called and left VM at [phone] stating that xray was normal with no acute findings stable changes from COPD per Ladona Ridgel

## 2020-03-29 ENCOUNTER — Telehealth: Payer: Self-pay

## 2020-03-30 ENCOUNTER — Encounter: Payer: Self-pay | Admitting: Emergency Medicine

## 2020-03-30 ENCOUNTER — Inpatient Hospital Stay
Admission: EM | Admit: 2020-03-30 | Discharge: 2020-04-03 | DRG: 191 | Disposition: A | Discharge status: Home Health | Payer: Medicare Other | Attending: Internal Medicine | Admitting: Internal Medicine

## 2020-03-30 ENCOUNTER — Other Ambulatory Visit: Payer: Self-pay

## 2020-03-30 ENCOUNTER — Emergency Department: Payer: Medicare Other

## 2020-03-30 DIAGNOSIS — E7800 Pure hypercholesterolemia, unspecified: Secondary | ICD-10-CM | POA: Diagnosis present

## 2020-03-30 DIAGNOSIS — I248 Other forms of acute ischemic heart disease: Secondary | ICD-10-CM | POA: Diagnosis not present

## 2020-03-30 DIAGNOSIS — Z79899 Other long term (current) drug therapy: Secondary | ICD-10-CM

## 2020-03-30 DIAGNOSIS — J209 Acute bronchitis, unspecified: Secondary | ICD-10-CM | POA: Diagnosis present

## 2020-03-30 DIAGNOSIS — R778 Other specified abnormalities of plasma proteins: Secondary | ICD-10-CM

## 2020-03-30 DIAGNOSIS — D72823 Leukemoid reaction: Secondary | ICD-10-CM | POA: Diagnosis not present

## 2020-03-30 DIAGNOSIS — Z881 Allergy status to other antibiotic agents status: Secondary | ICD-10-CM

## 2020-03-30 DIAGNOSIS — G2581 Restless legs syndrome: Secondary | ICD-10-CM | POA: Diagnosis present

## 2020-03-30 DIAGNOSIS — Z7951 Long term (current) use of inhaled steroids: Secondary | ICD-10-CM | POA: Diagnosis not present

## 2020-03-30 DIAGNOSIS — J441 Chronic obstructive pulmonary disease with (acute) exacerbation: Principal | ICD-10-CM | POA: Diagnosis present

## 2020-03-30 DIAGNOSIS — K219 Gastro-esophageal reflux disease without esophagitis: Secondary | ICD-10-CM | POA: Diagnosis present

## 2020-03-30 DIAGNOSIS — D72825 Bandemia: Secondary | ICD-10-CM | POA: Diagnosis not present

## 2020-03-30 DIAGNOSIS — M1990 Unspecified osteoarthritis, unspecified site: Secondary | ICD-10-CM | POA: Diagnosis present

## 2020-03-30 DIAGNOSIS — E669 Obesity, unspecified: Secondary | ICD-10-CM | POA: Diagnosis present

## 2020-03-30 DIAGNOSIS — Z955 Presence of coronary angioplasty implant and graft: Secondary | ICD-10-CM | POA: Diagnosis not present

## 2020-03-30 DIAGNOSIS — R7989 Other specified abnormal findings of blood chemistry: Secondary | ICD-10-CM | POA: Diagnosis present

## 2020-03-30 DIAGNOSIS — Z880 Allergy status to penicillin: Secondary | ICD-10-CM

## 2020-03-30 DIAGNOSIS — R0602 Shortness of breath: Secondary | ICD-10-CM

## 2020-03-30 DIAGNOSIS — J9611 Chronic respiratory failure with hypoxia: Secondary | ICD-10-CM

## 2020-03-30 DIAGNOSIS — Z9981 Dependence on supplemental oxygen: Secondary | ICD-10-CM

## 2020-03-30 DIAGNOSIS — E785 Hyperlipidemia, unspecified: Secondary | ICD-10-CM | POA: Diagnosis present

## 2020-03-30 DIAGNOSIS — Z7982 Long term (current) use of aspirin: Secondary | ICD-10-CM | POA: Diagnosis not present

## 2020-03-30 DIAGNOSIS — I480 Paroxysmal atrial fibrillation: Secondary | ICD-10-CM | POA: Diagnosis present

## 2020-03-30 DIAGNOSIS — Z8673 Personal history of transient ischemic attack (TIA), and cerebral infarction without residual deficits: Secondary | ICD-10-CM | POA: Diagnosis not present

## 2020-03-30 DIAGNOSIS — Z882 Allergy status to sulfonamides status: Secondary | ICD-10-CM

## 2020-03-30 DIAGNOSIS — I10 Essential (primary) hypertension: Secondary | ICD-10-CM | POA: Diagnosis present

## 2020-03-30 DIAGNOSIS — Z20822 Contact with and (suspected) exposure to covid-19: Secondary | ICD-10-CM | POA: Diagnosis present

## 2020-03-30 DIAGNOSIS — E1165 Type 2 diabetes mellitus with hyperglycemia: Secondary | ICD-10-CM | POA: Diagnosis not present

## 2020-03-30 DIAGNOSIS — Z87891 Personal history of nicotine dependence: Secondary | ICD-10-CM

## 2020-03-30 DIAGNOSIS — Z808 Family history of malignant neoplasm of other organs or systems: Secondary | ICD-10-CM

## 2020-03-30 DIAGNOSIS — Z803 Family history of malignant neoplasm of breast: Secondary | ICD-10-CM

## 2020-03-30 DIAGNOSIS — Z6831 Body mass index (BMI) 31.0-31.9, adult: Secondary | ICD-10-CM

## 2020-03-30 DIAGNOSIS — D72829 Elevated white blood cell count, unspecified: Secondary | ICD-10-CM | POA: Diagnosis present

## 2020-03-30 DIAGNOSIS — Z801 Family history of malignant neoplasm of trachea, bronchus and lung: Secondary | ICD-10-CM

## 2020-03-30 DIAGNOSIS — Z79890 Hormone replacement therapy (postmenopausal): Secondary | ICD-10-CM

## 2020-03-30 DIAGNOSIS — E039 Hypothyroidism, unspecified: Secondary | ICD-10-CM | POA: Diagnosis present

## 2020-03-30 DIAGNOSIS — I2510 Atherosclerotic heart disease of native coronary artery without angina pectoris: Secondary | ICD-10-CM | POA: Diagnosis present

## 2020-03-30 DIAGNOSIS — Z888 Allergy status to other drugs, medicaments and biological substances status: Secondary | ICD-10-CM

## 2020-03-30 DIAGNOSIS — J440 Chronic obstructive pulmonary disease with acute lower respiratory infection: Secondary | ICD-10-CM | POA: Diagnosis present

## 2020-03-30 DIAGNOSIS — E119 Type 2 diabetes mellitus without complications: Secondary | ICD-10-CM

## 2020-03-30 DIAGNOSIS — Z794 Long term (current) use of insulin: Secondary | ICD-10-CM

## 2020-03-30 LAB — CBC
HCT: 34 % — ABNORMAL LOW (ref 36.0–46.0)
Hemoglobin: 10.5 g/dL — ABNORMAL LOW (ref 12.0–15.0)
MCH: 26 pg (ref 26.0–34.0)
MCHC: 30.9 g/dL (ref 30.0–36.0)
MCV: 84.2 fL (ref 80.0–100.0)
Platelets: 351 10*3/uL (ref 150–400)
RBC: 4.04 MIL/uL (ref 3.87–5.11)
RDW: 15.5 % (ref 11.5–15.5)
WBC: 20.7 10*3/uL — ABNORMAL HIGH (ref 4.0–10.5)
nRBC: 0 % (ref 0.0–0.2)

## 2020-03-30 LAB — RESPIRATORY PANEL BY RT PCR (FLU A&B, COVID)
Influenza A by PCR: NEGATIVE
Influenza B by PCR: NEGATIVE
SARS Coronavirus 2 by RT PCR: NEGATIVE

## 2020-03-30 LAB — URINALYSIS, ROUTINE W REFLEX MICROSCOPIC
Bilirubin Urine: NEGATIVE
Glucose, UA: >=500 mg/dL — AB
Hgb urine dipstick: NEGATIVE
Ketones, ur: NEGATIVE mg/dL
Leukocytes,Ua: NEGATIVE
Nitrite: NEGATIVE
Protein, ur: NEGATIVE mg/dL
Specific Gravity, Urine: 1.011 (ref 1.005–1.030)
pH: 6 (ref 5.0–8.0)

## 2020-03-30 LAB — BASIC METABOLIC PANEL
Anion gap: 10 (ref 5–15)
BUN: 34 mg/dL — ABNORMAL HIGH (ref 8–23)
CO2: 26 mmol/L (ref 22–32)
Calcium: 8.8 mg/dL — ABNORMAL LOW (ref 8.9–10.3)
Chloride: 101 mmol/L (ref 98–111)
Creatinine, Ser: 0.98 mg/dL (ref 0.44–1.00)
GFR, Estimated: 55 mL/min — ABNORMAL LOW (ref >60)
Glucose, Bld: 101 mg/dL — ABNORMAL HIGH (ref 70–99)
Potassium: 4.2 mmol/L (ref 3.5–5.1)
Sodium: 137 mmol/L (ref 135–145)

## 2020-03-30 LAB — CBG MONITORING, ED: Glucose-Capillary: 277 mg/dL — ABNORMAL HIGH (ref 70–99)

## 2020-03-30 LAB — BRAIN NATRIURETIC PEPTIDE: B Natriuretic Peptide: 395.9 pg/mL — ABNORMAL HIGH (ref 0.0–100.0)

## 2020-03-30 LAB — TROPONIN I (HIGH SENSITIVITY)
Troponin I (High Sensitivity): 109 ng/L (ref <18)
Troponin I (High Sensitivity): 79 ng/L — ABNORMAL HIGH (ref <18)
Troponin I (High Sensitivity): 72 ng/L — ABNORMAL HIGH (ref <18)

## 2020-03-30 LAB — GLUCOSE, CAPILLARY: Glucose-Capillary: 265 mg/dL — ABNORMAL HIGH (ref 70–99)

## 2020-03-30 MED ORDER — MELATONIN 5 MG PO TABS
5.0000 mg | ORAL_TABLET | Freq: Every evening | ORAL | Status: DC | PRN
  Administered 2020-03-31 – 2020-04-02 (×3): 5 mg via ORAL
  Filled 2020-03-30 (×4): qty 1

## 2020-03-30 MED ORDER — IPRATROPIUM-ALBUTEROL 0.5-2.5 (3) MG/3ML IN SOLN
3.0000 mL | Freq: Four times a day (QID) | RESPIRATORY_TRACT | Status: DC
  Administered 2020-03-31 – 2020-04-03 (×13): 3 mL via RESPIRATORY_TRACT
  Filled 2020-03-30 (×13): qty 3

## 2020-03-30 MED ORDER — SODIUM CHLORIDE 0.9% FLUSH
3.0000 mL | INTRAVENOUS | Status: DC | PRN
  Administered 2020-04-02: 3 mL via INTRAVENOUS

## 2020-03-30 MED ORDER — SODIUM CHLORIDE 0.9 % IV SOLN: 250.0000 mL | INTRAVENOUS | Status: DC | PRN

## 2020-03-30 MED ORDER — ISOSORBIDE MONONITRATE 20 MG PO TABS: 20.0000 mg | ORAL_TABLET | Freq: Two times a day (BID) | ORAL | Status: DC

## 2020-03-30 MED ORDER — GABAPENTIN 100 MG PO CAPS
100.0000 mg | ORAL_CAPSULE | Freq: Every day | ORAL | Status: DC
  Administered 2020-03-30 – 2020-04-02 (×4): 100 mg via ORAL
  Filled 2020-03-30 (×5): qty 1

## 2020-03-30 MED ORDER — INSULIN ASPART 100 UNIT/ML ~~LOC~~ SOLN
0.0000 [IU] | Freq: Three times a day (TID) | SUBCUTANEOUS | Status: DC
  Administered 2020-03-30 – 2020-03-31 (×2): 7 [IU] via SUBCUTANEOUS
  Administered 2020-03-31: 4 [IU] via SUBCUTANEOUS
  Administered 2020-03-31: 7 [IU] via SUBCUTANEOUS
  Administered 2020-04-01: 4 [IU] via SUBCUTANEOUS
  Administered 2020-04-01: 7 [IU] via SUBCUTANEOUS
  Administered 2020-04-01: 4 [IU] via SUBCUTANEOUS
  Administered 2020-04-02: 20 [IU] via SUBCUTANEOUS
  Administered 2020-04-02: 3 [IU] via SUBCUTANEOUS
  Administered 2020-04-03: 11 [IU] via SUBCUTANEOUS
  Administered 2020-04-03: 7 [IU] via SUBCUTANEOUS
  Administered 2020-04-03: 3 [IU] via SUBCUTANEOUS
  Filled 2020-03-30 (×12): qty 1

## 2020-03-30 MED ORDER — METHYLPREDNISOLONE SODIUM SUCC 125 MG IJ SOLR
125.0000 mg | Freq: Once | INTRAMUSCULAR | Status: AC
  Administered 2020-03-30: 125 mg via INTRAVENOUS
  Filled 2020-03-30: qty 2

## 2020-03-30 MED ORDER — ASCORBIC ACID 500 MG PO TABS
1000.0000 mg | ORAL_TABLET | Freq: Every day | ORAL | Status: DC
  Administered 2020-03-30 – 2020-04-03 (×5): 1000 mg via ORAL
  Filled 2020-03-30 (×5): qty 2

## 2020-03-30 MED ORDER — IPRATROPIUM-ALBUTEROL 0.5-2.5 (3) MG/3ML IN SOLN
3.0000 mL | Freq: Once | RESPIRATORY_TRACT | Status: AC
  Administered 2020-03-30: 3 mL via RESPIRATORY_TRACT
  Filled 2020-03-30: qty 3

## 2020-03-30 MED ORDER — LEVOTHYROXINE SODIUM 50 MCG PO TABS
50.0000 ug | ORAL_TABLET | Freq: Every day | ORAL | Status: DC
  Administered 2020-03-31 – 2020-04-03 (×4): 50 ug via ORAL
  Filled 2020-03-30 (×4): qty 1

## 2020-03-30 MED ORDER — ISOSORBIDE MONONITRATE ER 30 MG PO TB24
30.0000 mg | ORAL_TABLET | Freq: Every day | ORAL | Status: DC
  Administered 2020-03-30 – 2020-04-03 (×5): 30 mg via ORAL
  Filled 2020-03-30 (×5): qty 1

## 2020-03-30 MED ORDER — METOPROLOL SUCCINATE ER 100 MG PO TB24
100.0000 mg | ORAL_TABLET | Freq: Every day | ORAL | Status: DC
  Administered 2020-03-30 – 2020-04-03 (×5): 100 mg via ORAL
  Filled 2020-03-30 (×3): qty 1
  Filled 2020-03-30: qty 2
  Filled 2020-03-30: qty 1

## 2020-03-30 MED ORDER — VITAMIN B-12 1000 MCG PO TABS
1000.0000 ug | ORAL_TABLET | Freq: Every day | ORAL | Status: DC
  Administered 2020-03-30 – 2020-04-03 (×5): 1000 ug via ORAL
  Filled 2020-03-30 (×5): qty 1

## 2020-03-30 MED ORDER — BUDESONIDE 0.25 MG/2ML IN SUSP
0.2500 mg | Freq: Two times a day (BID) | RESPIRATORY_TRACT | Status: DC
  Administered 2020-03-31 – 2020-04-03 (×7): 0.25 mg via RESPIRATORY_TRACT
  Filled 2020-03-30 (×7): qty 2

## 2020-03-30 MED ORDER — RISAQUAD PO CAPS
2.0000 | ORAL_CAPSULE | Freq: Every day | ORAL | Status: DC
  Administered 2020-03-31 – 2020-04-03 (×4): 2 via ORAL
  Filled 2020-03-30 (×4): qty 2

## 2020-03-30 MED ORDER — DARIFENACIN HYDROBROMIDE ER 7.5 MG PO TB24
7.5000 mg | ORAL_TABLET | Freq: Every day | ORAL | Status: DC
  Administered 2020-03-30 – 2020-04-03 (×5): 7.5 mg via ORAL
  Filled 2020-03-30 (×5): qty 1

## 2020-03-30 MED ORDER — VITAMIN D 25 MCG (1000 UNIT) PO TABS
5000.0000 [IU] | ORAL_TABLET | Freq: Every day | ORAL | Status: DC
  Administered 2020-03-30 – 2020-04-03 (×5): 5000 [IU] via ORAL
  Filled 2020-03-30 (×5): qty 5

## 2020-03-30 MED ORDER — PSYLLIUM 95 % PO PACK
1.0000 | PACK | Freq: Every day | ORAL | Status: DC
  Administered 2020-03-31 – 2020-04-03 (×4): 1 via ORAL
  Filled 2020-03-30 (×4): qty 1

## 2020-03-30 MED ORDER — ENOXAPARIN SODIUM 40 MG/0.4ML ~~LOC~~ SOLN
40.0000 mg | SUBCUTANEOUS | Status: DC
  Administered 2020-03-30 – 2020-03-31 (×2): 40 mg via SUBCUTANEOUS
  Filled 2020-03-30 (×2): qty 0.4

## 2020-03-30 MED ORDER — SODIUM CHLORIDE 0.9% FLUSH
3.0000 mL | Freq: Two times a day (BID) | INTRAVENOUS | Status: DC
  Administered 2020-03-30 – 2020-04-03 (×9): 3 mL via INTRAVENOUS

## 2020-03-30 MED ORDER — ASPIRIN EC 81 MG PO TBEC
81.0000 mg | DELAYED_RELEASE_TABLET | Freq: Every day | ORAL | Status: DC
  Administered 2020-03-30 – 2020-04-01 (×3): 81 mg via ORAL
  Filled 2020-03-30 (×3): qty 1

## 2020-03-30 MED ORDER — HYDROXYZINE HCL 25 MG PO TABS
25.0000 mg | ORAL_TABLET | Freq: Four times a day (QID) | ORAL | Status: DC | PRN
  Administered 2020-03-31 – 2020-04-01 (×2): 25 mg via ORAL
  Filled 2020-03-30 (×2): qty 1

## 2020-03-30 MED ORDER — ALBUTEROL SULFATE (2.5 MG/3ML) 0.083% IN NEBU
2.5000 mg | INHALATION_SOLUTION | RESPIRATORY_TRACT | Status: DC | PRN
  Administered 2020-04-01: 2.5 mg via RESPIRATORY_TRACT
  Filled 2020-03-30: qty 3

## 2020-03-30 MED ORDER — PANTOPRAZOLE SODIUM 40 MG PO TBEC
40.0000 mg | DELAYED_RELEASE_TABLET | Freq: Every day | ORAL | Status: DC
  Administered 2020-03-31 – 2020-04-03 (×4): 40 mg via ORAL
  Filled 2020-03-30 (×4): qty 1

## 2020-03-30 NOTE — ED Notes (Signed)
Per previous RN pt is hard stick. If this RN cannot obtain necessary bloodwork soon will call lab to assist. Current IV will not draw back enough blood.

## 2020-03-30 NOTE — ED Notes (Signed)
Attempted report. Floor RN to call this RN back with questions.

## 2020-03-30 NOTE — ED Triage Notes (Signed)
End stage copd and has been sob for 3 weeks.  Has done out patient tx with steroids and antibiotics without any improvement and dr Welton Flakes told her to come to hospital for iv medications.

## 2020-03-30 NOTE — ED Notes (Signed)
Pt given basic food tray as dinner tray still not received in ER.

## 2020-03-30 NOTE — H&P (Signed)
History and Physical    Peggy [name] EBX:[id number] DOB: March 25, 1929 DOA: 03/30/2020  PCP: Lauro Regulus, MD   Patient coming from: Home  I have personally briefly reviewed patient's old medical records in [facility]   Chief Complaint: Shortness of breath  HPI: Peggy [name] is a 86 y.o. 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 fib who presents to the emergency room for evaluation of worsening shortness of breath from her baseline.  At baseline patient is usually short of breath but now with very mild exertion she has difficulty breathing.  She has a cough which is nonproductive with intermittent chest tightness.  Patient states she is unable to lay flat because she becomes very short of breath but does not have any lower extremity swelling.  She denies having any fever or chills, she has no nausea, no vomiting, no abdominal pain, no changes in her bowel habits or urinary symptoms. She has received outpatient treatment for her symptoms without any improvement, she completed 2 courses of antibiotic therapy as well as systemic steroids without any improvement in her symptoms.  Her pulmonologist advised her to come to the emergency room for further evaluation. She is up-to-date with her COVID-19 vaccination Labs show sodium 137, potassium 4.2, chloride 101, bicarb 26, BUN 34, creatinine 0.98, calcium 8.8, BNP 395, troponin I 109, white count 20.7, hemoglobin 10.5, hematocrit 34, MCV 84.2, RDW 15.5, platelet count 351 Chest x-ray reviewed by me shows hyperinflated lung fields, no acute cardiopulmonary process. 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   Labs reveal elevated troponin level of 109.  She has no acute EKG changes.  She will be admitted to the hospital for acute COPD exacerbation.  Review of Systems: As per HPI otherwise 10 point review of systems negative.     Past Medical History:  Diagnosis Date  . A-fib (HCC)   . Allergy   . Arthritis   . COPD (chronic obstructive pulmonary disease) (HCC)   . Diabetes mellitus without complication (HCC)   . GERD (gastroesophageal reflux disease)   . Hyperlipidemia   . Hypertension   . Oxygen dependent    2 liters  . Stroke [facility])    5 years ago per daughter  . TIA (transient ischemic attack)     Past Surgical History:  Procedure Laterality Date  . ABDOMINAL HYSTERECTOMY  1970  . BACK SURGERY  2013  . colectomy Right 10/08/2013   Dr. Egbert Garibaldi  . CORONARY ANGIOPLASTY WITH STENT PLACEMENT       reports that she quit smoking about 20 years ago. She has a 30.00 pack-year smoking history. She has never used smokeless tobacco. She reports that she does not drink alcohol and does not use drugs.  Allergies  Allergen Reactions  . Diphenhydramine Rash    AGITATION/DELIRIUM  . Metformin Diarrhea  . Oxybutynin Other (See Comments)    dizzy  . Amlodipine Rash  . Ciprofloxacin Rash  . Lisinopril Rash  . Penicillins Rash    Family states was a "long time ago"  .  Saxagliptin Rash  . Sulfamethoxazole-Trimethoprim Rash    Family History  Problem Relation Age of Onset  . Breast cancer Mother   . Pancreatitis Father   . Breast cancer Sister   . Lung cancer Brother   . Melanoma Brother   . Throat cancer Brother      Prior to Admission medications   Medication Sig Start Date End Date Taking? Authorizing Provider  Ascorbic Acid (VITAMIN C WITH ROSE HIPS) 1000 MG tablet Take 1,000 mg by mouth daily.    [provider]  aspirin EC 81 MG EC tablet Take 1 tablet (81  mg total) by mouth daily. 03/09/19   Mayo, Allyn Kenner, MD  budesonide-formoterol [facility]) 80-4.5 MCG/ACT inhaler Inhale 2 puffs into the lungs 2 (two) times daily. 02/14/20   Lyndon Code, MD  chlorpheniramine-HYDROcodone [facility]) 10-8 MG/5ML SUER Take 5 mLs by mouth every 12 (twelve) hours as needed for cough. 03/21/20   Theotis Burrow, NP  Cholecalciferol (VITAMIN D3) 5000 units TABS Take 5,000 Units by mouth daily.    [provider]  esomeprazole (NEXIUM) 40 MG capsule Take 1 capsule (40 mg total) by mouth daily. 01/05/20   [name], NP  gabapentin (NEURONTIN) 100 MG capsule Take 100 mg by mouth daily. 11/23/19   [provider]  guaiFENesin (MUCINEX) 600 MG 12 hr tablet Take by mouth 2 (two) times daily.    [provider]  HUMULIN 70/30 KWIKPEN (70-30) 100 UNIT/ML PEN Inject 20 Units into the skin 2 (two) times daily. Patient taking differently: Inject 35 Units into the skin 2 (two) times daily.  09/26/17   Enedina Finner, MD  hydrOXYzine (ATARAX/VISTARIL) 25 MG tablet Take 25 mg by mouth 4 (four) times daily as needed for itching. 06/05/17   [provider]  isosorbide mononitrate (ISMO) 10 MG tablet Take by mouth 2 (two) times daily. Pt not sure about how many mg    [provider]  lactobacillus acidophilus (BACID) TABS tablet Take 2 tablets by mouth daily.    [provider]  levofloxacin (LEVAQUIN) 500 MG tablet Take 1 tablet (500 mg total) by mouth daily. 03/22/20   Yevonne Pax, MD  levothyroxine (SYNTHROID, LEVOTHROID) 50 MCG tablet Take 1 tablet (50 mcg total) by mouth daily. PATIENT NEEDS TO SCHEDULE OFFICE VISIT FOR FOLLOW UP 02/01/16   Malva Limes, MD  metoprolol succinate (TOPROL-XL) 100 MG 24 hr tablet Take 1 tablet (100 mg total) by mouth daily. Take with or immediately following a meal. 11/25/18   Jimmye Norman, NP  OXYGEN Inhale 2 L into the lungs.    [provider]  predniSONE  (DELTASONE) 10 MG tablet Take 1 tablet (10 mg total) by mouth daily with breakfast. Dose pack 03/22/20   Freda Munro A, MD  psyllium (REGULOID) 0.52 g capsule Take 0.52 g by mouth daily.    [provider]  trospium (SANCTURA) 20 MG tablet Take 20 mg by mouth 2 (two) times daily. 12/04/19   [provider]  VENTOLIN HFA 108 (90 Base) MCG/ACT inhaler Inhale 2 puffs into the lungs every 6 (six) hours as needed. 05/24/17   [provider]  vitamin B-12 (CYANOCOBALAMIN) 1000 MCG tablet Take 1,000 mcg by mouth daily.    [provider]    Physical Exam: Vitals:   03/30/20 0807 03/30/20 0809 03/30/20 1200  BP: (!) 183/57  (!) 163/70  Pulse: 84  72  Resp: (!) 22  (!) 22  Temp: (!) 97.5 F (36.4 C)    TempSrc: Axillary    SpO2: 99%  100%  Weight:  79.2 kg   Height:  5\' 2"  (1.575 m)      Vitals:   03/30/20 0807 03/30/20 0809 03/30/20 1200  BP: (!) 183/57  (!) 163/70  Pulse: 84  72  Resp: (!) 22  (!) 22  Temp: (!) 97.5 F (36.4 C)    TempSrc: Axillary    SpO2: 99%  100%  Weight:  79.2 kg   Height:  5\' 2"  (1.575 m)     Constitutional: NAD, alert and oriented x 3.  Looks younger than her stated age Eyes: PERRL, lids and conjunctivae normal ENMT: Mucous membranes are moist.  Neck: normal, supple, no masses, no thyromegaly Respiratory: Coarse breath sounds in all lung fields, scattered wheezing, no crackles. Normal respiratory effort. No accessory muscle use.  Cardiovascular: Regular rate and rhythm, no murmurs / rubs / gallops. No extremity edema. 2+ pedal pulses. No carotid bruits.  Abdomen: no tenderness, no masses palpated. No hepatosplenomegaly. Bowel sounds positive.  Central adiposity Musculoskeletal: no clubbing / cyanosis. No joint deformity upper and lower extremities.  Skin: no rashes, lesions, ulcers.  Neurologic: No gross focal neurologic deficit. Psychiatric: Normal mood and affect.   Labs on Admission: I have personally reviewed  following labs and imaging studies  CBC: Recent Labs  Lab 03/30/20 1045  WBC 20.7*  HGB 10.5*  HCT 34.0*  MCV 84.2  PLT 351   Basic Metabolic Panel: Recent Labs  Lab 03/30/20 1045  NA 137  K 4.2  CL 101  CO2 26  GLUCOSE 101*  BUN 34*  CREATININE 0.98  CALCIUM 8.8*   GFR: Estimated Creatinine Clearance: 37.9 mL/min (by C-G formula based on SCr of 0.98 mg/dL). Liver Function Tests: No results for input(s): AST, ALT, ALKPHOS, BILITOT, PROT, ALBUMIN in the last 168 hours. No results for input(s): LIPASE, AMYLASE in the last 168 hours. No results for input(s): AMMONIA in the last 168 hours. Coagulation Profile: No results for input(s): INR, PROTIME in the last 168 hours. Cardiac Enzymes: No results for input(s): CKTOTAL, CKMB, CKMBINDEX, TROPONINI in the last 168 hours. BNP (last 3 results) No results for input(s): PROBNP in the last 8760 hours. HbA1C: No results for input(s): HGBA1C in the last 72 hours. CBG: No results for input(s): GLUCAP in the last 168 hours. Lipid Profile: No results for input(s): CHOL, HDL, LDLCALC, TRIG, CHOLHDL, LDLDIRECT in the last 72 hours. Thyroid Function Tests: No results for input(s): TSH, T4TOTAL, FREET4, T3FREE, THYROIDAB in the last 72 hours. Anemia Panel: No results for input(s): VITAMINB12, FOLATE, FERRITIN, TIBC, IRON, RETICCTPCT in the last 72 hours. Urine analysis:    Component Value Date/Time   COLORURINE STRAW (A) 03/06/2019 1149   APPEARANCEUR CLEAR (A) 03/06/2019 1149   APPEARANCEUR Hazy 08/11/2012 1201   LABSPEC 1.010 03/06/2019 1149   LABSPEC 1.020 08/11/2012 1201   PHURINE 7.0 03/06/2019 1149   GLUCOSEU 50 (A) 03/06/2019 1149   GLUCOSEU Negative 08/11/2012 1201   HGBUR NEGATIVE 03/06/2019 1149   BILIRUBINUR NEGATIVE 03/06/2019 1149   BILIRUBINUR neg 02/11/2015 0950   BILIRUBINUR Negative 08/11/2012 1201   KETONESUR NEGATIVE 03/06/2019 1149   PROTEINUR NEGATIVE 03/06/2019 1149   UROBILINOGEN 0.2 02/11/2015 0950     NITRITE NEGATIVE 03/06/2019 1149   LEUKOCYTESUR NEGATIVE 03/06/2019 1149   LEUKOCYTESUR 1+ 08/11/2012 1201    Radiological Exams on Admission: DG Chest 2 View  Result Date: 03/30/2020 CLINICAL DATA:  Shortness of breath for 3 weeks EXAM: CHEST - 2 VIEW COMPARISON:  03/22/2020 FINDINGS: Cardiac silhouette remains mildly enlarged. Atherosclerotic calcification of the aortic knob. Mildly hyperexpanded lungs. Chronically coarsened interstitial markings bilaterally. No focal airspace consolidation. No pleural effusion or pneumothorax. IMPRESSION: Stable exam.  No acute cardiopulmonary process. Electronically Signed   By: Duanne Guess D.O.   On: 03/30/2020 12:55    EKG: Independently reviewed.  Sinus rhythm  Assessment/Plan Principal Problem:   COPD with acute exacerbation (HCC) Active Problems:   Essential (primary) hypertension   AF (paroxysmal atrial fibrillation) (HCC)   Chronic respiratory failure with hypoxia (HCC)   Elevated troponin level   Leukocytosis      COPD with acute exacerbation Patient has a history of end-stage COPD with chronic respiratory failure and is on 4 L of oxygen continuous We will place patient on scheduled and as needed bronchodilator therapy Place patient on inhaled steroids Add expectorants    Elevated troponin Etiology unclear and may be cardiac in origin Patient has no EKG changes We will trend troponin levels Continue aspirin, metoprolol, nitrates We will consult cardiology Obtain 2D echocardiogram to rule out regional wall motion abnormality    Hypertension Blood pressure is uncontrolled Continue metoprolol and nitrates    Leukocytosis Most likely leukemoid reaction secondary to recent systemic steroid administration    Hypothyroidism Continue Synthroid   GERD Continue PPI    Diabetes mellitus Maintain consistent carbohydrate diet Glycemic control with sliding scale insulin    DVT prophylaxis: Lovenox Code  Status: Full code Family Communication: Greater than 50% of time was spent discussing plan of care with patient and her daughter at the bedside.  All questions and concerns have been addressed.  They verbalized understanding and agree with the plan.  CODE STATUS was discussed and she is a full code Disposition Plan: Back to previous home environment Consults called: Cardiology    Leilanny Fluitt MD [facility]     03/30/2020, 2:43 PM

## 2020-03-30 NOTE — ED Notes (Signed)
Checked other side of ER for pt's dinner tray as was ordered by pt hours ago. Yet to be received. Will double check it hasn't been taken to floor.

## 2020-03-30 NOTE — ED Notes (Signed)
Date and time results received: 03/30/20 1255 (use smartphrase ".now" to insert current time)  Test: Troponin Critical Value: 109  Name of Provider Notified: Lenard Lance, MD  Orders Received? Or Actions Taken?: Orders Received - See Orders for details

## 2020-03-30 NOTE — ED Provider Notes (Signed)
[facility] Emergency Department Provider Note  Time seen: 12:08 PM  I have reviewed the triage vital signs and the nursing notes.   HISTORY  Chief Complaint Shortness of Breath   HPI Peggy [name] is a 86 y.o. female with a past medical history of COPD on 4 L 24/7, gastric reflux, hypertension, hyperlipidemia, presents to the emergency department for worsening shortness of breath.  According to the patient and her daughter for the past several weeks the patient has had worsening shortness of breath especially with minimal exertion.  Patient states intermittent chest tightness.  States she cannot lie flat because she becomes too short of breath.  Denies any fever.  Patient has been following up with her pulmonologist Dr. Welton Flakes.  They have tried outpatient antibiotics x2, outpatient steroids.  Patient was told by her pulmonologist to come to the emergency department as she was not improving.   Past Medical History:  Diagnosis Date  . A-fib (HCC)   . Allergy   . Arthritis   . COPD (chronic obstructive pulmonary disease) (HCC)   . Diabetes mellitus without complication (HCC)   . GERD (gastroesophageal reflux disease)   . Hyperlipidemia   . Hypertension   . Oxygen dependent    2 liters  . Stroke [facility])    5 years ago per daughter  . TIA (transient ischemic attack)     Patient Active Problem List   Diagnosis Date Noted  . COPD (chronic obstructive pulmonary disease) (HCC) 03/22/2020  . Uncontrolled hypertension 11/22/2018  . Chronic respiratory failure with hypoxia (HCC) 10/08/2017  . Simple chronic bronchitis (HCC) 10/08/2017  . SOB (shortness of breath) 09/24/2017  . History of stroke 08/17/2017  . Acute respiratory failure with hypoxia and hypercarbia (HCC) 08/07/2017  . Respiratory failure (HCC) 08/07/2017  . History of GI bleed 07/07/2017  . Atrial fibrillation with RVR (HCC) 06/30/2017  . Bilateral carotid artery disease (HCC) 04/14/2017  . Healthcare  maintenance 04/14/2017  . Dizziness 04/07/2017  . 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 TIA (transient ischemic attack) 12/10/2016  . CVA (cerebral vascular accident) (HCC) 12/09/2016  . Bilateral arm pain 11/27/2016  . Myocardial infarction (HCC) 07/30/2015  . Urinary frequency 02/09/2015  . RUQ abdominal pain 01/26/2015  . Shortness of breath 01/26/2015  . Anxiety 01/26/2015  . Chronic pruritus 12/21/2014  . Cerebral vascular accident (HCC) 11/16/2014  . Chronic low back pain 11/16/2014  . Adult hypothyroidism 11/15/2014  . Allergic rhinitis 11/15/2014  . Body mass index (BMI) of 28.0-28.9 in adult 11/15/2014  . Arthritis 11/15/2014  . Cramps of lower extremity 11/15/2014  . Essential (primary) hypertension 11/15/2014  . Acid reflux 11/15/2014  . Hypercholesteremia 11/15/2014  . Cannot sleep 11/15/2014  . Psoriasis 11/15/2014  . Itch of skin 11/15/2014  . Restless leg 11/15/2014  . Diabetes mellitus, type 2 (HCC) 11/15/2014  . Breath shortness 11/15/2014  . Dermatitis due to unknown cause 04/10/2014  . Arteriosclerosis of coronary artery 02/20/2014  . AF (paroxysmal atrial fibrillation) (HCC) 02/20/2014  . Temporary cerebral vascular dysfunction 02/20/2014  . DD (diverticular disease) 10/05/2013    Past Surgical History:  Procedure Laterality Date  . ABDOMINAL HYSTERECTOMY  1970  . BACK SURGERY  2013  . colectomy Right 10/08/2013   Dr. Egbert Garibaldi  . CORONARY ANGIOPLASTY WITH STENT PLACEMENT      Prior to Admission medications   Medication Sig Start Date End Date Taking? Authorizing Provider  Ascorbic Acid (VITAMIN C WITH  ROSE HIPS) 1000 MG tablet Take 1,000 mg by mouth daily.    [provider]  aspirin EC 81 MG EC tablet Take 1 tablet (81 mg total) by mouth daily. 03/09/19   Mayo, Allyn Kenner, MD  budesonide-formoterol [facility]) 80-4.5 MCG/ACT inhaler Inhale 2 puffs into the lungs 2 (two) times daily. 02/14/20   Lyndon Code, MD   chlorpheniramine-HYDROcodone [facility]) 10-8 MG/5ML SUER Take 5 mLs by mouth every 12 (twelve) hours as needed for cough. 03/21/20   Theotis Burrow, NP  Cholecalciferol (VITAMIN D3) 5000 units TABS Take 5,000 Units by mouth daily.    [provider]  esomeprazole (NEXIUM) 40 MG capsule Take 1 capsule (40 mg total) by mouth daily. 01/05/20   [name], NP  gabapentin (NEURONTIN) 100 MG capsule Take 100 mg by mouth daily. 11/23/19   [provider]  guaiFENesin (MUCINEX) 600 MG 12 hr tablet Take by mouth 2 (two) times daily.    [provider]  HUMULIN 70/30 KWIKPEN (70-30) 100 UNIT/ML PEN Inject 20 Units into the skin 2 (two) times daily. Patient taking differently: Inject 35 Units into the skin 2 (two) times daily.  09/26/17   Enedina Finner, MD  hydrOXYzine (ATARAX/VISTARIL) 25 MG tablet Take 25 mg by mouth 4 (four) times daily as needed for itching. 06/05/17   [provider]  isosorbide mononitrate (ISMO) 10 MG tablet Take by mouth 2 (two) times daily. Pt not sure about how many mg    [provider]  lactobacillus acidophilus (BACID) TABS tablet Take 2 tablets by mouth daily.    [provider]  levofloxacin (LEVAQUIN) 500 MG tablet Take 1 tablet (500 mg total) by mouth daily. 03/22/20   Yevonne Pax, MD  levothyroxine (SYNTHROID, LEVOTHROID) 50 MCG tablet Take 1 tablet (50 mcg total) by mouth daily. PATIENT NEEDS TO SCHEDULE OFFICE VISIT FOR FOLLOW UP 02/01/16   Malva Limes, MD  metoprolol succinate (TOPROL-XL) 100 MG 24 hr tablet Take 1 tablet (100 mg total) by mouth daily. Take with or immediately following a meal. 11/25/18   Jimmye Norman, NP  OXYGEN Inhale 2 L into the lungs.    [provider]  predniSONE (DELTASONE) 10 MG tablet Take 1 tablet (10 mg total) by mouth daily with breakfast. Dose pack 03/22/20   Freda Munro A, MD  psyllium (REGULOID) 0.52 g capsule Take 0.52 g by mouth daily.     [provider]  trospium (SANCTURA) 20 MG tablet Take 20 mg by mouth 2 (two) times daily. 12/04/19   [provider]  VENTOLIN HFA 108 (90 Base) MCG/ACT inhaler Inhale 2 puffs into the lungs every 6 (six) hours as needed. 05/24/17   [provider]  vitamin B-12 (CYANOCOBALAMIN) 1000 MCG tablet Take 1,000 mcg by mouth daily.    [provider]    Allergies  Allergen Reactions  . Diphenhydramine Rash    AGITATION/DELIRIUM  . Metformin Diarrhea  . Oxybutynin Other (See Comments)    dizzy  . Amlodipine Rash  . Ciprofloxacin Rash  . Lisinopril Rash  . Penicillins Rash    Family states was a "long time ago"  . Saxagliptin Rash  . Sulfamethoxazole-Trimethoprim Rash    Family History  Problem Relation Age of Onset  . Breast cancer Mother   . Pancreatitis Father   . Breast cancer Sister   . Lung cancer Brother   . Melanoma Brother   . Throat cancer Brother  Social History Social History   Tobacco Use  . Smoking status: Former Smoker    Packs/day: 1.00    Years: 30.00    Pack years: 30.00    Quit date: 11/30/1999    Years since quitting: 20.3  . Smokeless tobacco: Never Used  Vaping Use  . Vaping Use: Never used  Substance Use Topics  . Alcohol use: No  . Drug use: No    Review of Systems Constitutional: Negative for fever. Cardiovascular: Negative for chest pain. Respiratory: Positive for shortness of breath, worse with exertion.  Positive for cough. Gastrointestinal: Negative for abdominal pain, vomiting  Musculoskeletal: Negative for musculoskeletal complaints Neurological: Negative for headache All other ROS negative  ____________________________________________   PHYSICAL EXAM:  VITAL SIGNS: ED Triage Vitals  Enc Vitals Group     BP 03/30/20 0807 (!) 183/57     Pulse Rate 03/30/20 0807 84     Resp 03/30/20 0807 (!) 22     Temp 03/30/20 0807 (!) 97.5 F (36.4 C)     Temp Source 03/30/20 0807 Axillary     SpO2  03/30/20 0807 99 %     Weight 03/30/20 0809 174 lb 9.7 oz (79.2 kg)     Height 03/30/20 0809 5\' 2"  (1.575 m)     Head Circumference --      Peak Flow --      Pain Score 03/30/20 0809 0     Pain Loc --      Pain Edu? --      Excl. in GC? --    Constitutional: Alert and oriented. Well appearing and in no distress. Eyes: Normal exam ENT      Head: Normocephalic and atraumatic.      Mouth/Throat: Mucous membranes are moist. Cardiovascular: Normal rate, regular rhythm.  Respiratory: Mild tachypnea.  Patient does have mild bilateral rhonchi.  No obvious wheeze.  Appears to be moving a good amount of air. Gastrointestinal: Soft and nontender. No distention.   Musculoskeletal: Nontender with normal range of motion in all extremities.  Neurologic:  Normal speech and language. No gross focal neurologic deficit Skin:  Skin is warm, dry and intact.  Psychiatric: Mood and affect are normal.  ____________________________________________   RADIOLOGY  Chest x-ray is negative.  ____________________________________________   INITIAL IMPRESSION / ASSESSMENT AND PLAN / ED COURSE  Pertinent labs & imaging results that were available during my care of the patient were reviewed by me and considered in my medical decision making (see chart for details).   Patient presents emergency department for continued cough shortness of breath worse with exertion or lying flat.  Has been following up with her pulmonologist has tried outpatient antibiotics and steroids without success and was sent to the emergency department for further evaluation and treatment.  Patient does have a frequent cough during examination does have mild rhonchi bilaterally.  We will obtain labs including cardiac enzymes and a BNP.  We will obtain a chest x-ray and continue to closely monitor.  Patient agreeable to plan of care.  Patient's troponin is elevated.  Given her shortness of breath we will admit to the hospital service for  further work-up and treatment.  Suspect likely COPD exacerbation with demand ischemia over NSTEMI.  Xandria Gallaga Kloepfer was evaluated in Emergency Department on 03/30/2020 for the symptoms described in the history of present illness. She was evaluated in the context of the global COVID-19 pandemic, which necessitated consideration that the patient might be at risk for infection with  the SARS-CoV-2 virus that causes COVID-19. Institutional protocols and algorithms that pertain to the evaluation of patients at risk for COVID-19 are in a state of rapid change based on information released by regulatory bodies including the CDC and federal and state organizations. These policies and algorithms were followed during the patient's care in the ED.  ____________________________________________   FINAL CLINICAL IMPRESSION(S) / ED DIAGNOSES  Dyspnea COPD exacerbation   Minna Antis, MD 03/31/20 1027

## 2020-03-30 NOTE — ED Notes (Signed)
IV team at bedside 

## 2020-03-30 NOTE — Plan of Care (Signed)

## 2020-03-31 ENCOUNTER — Encounter: Payer: Self-pay | Admitting: Internal Medicine

## 2020-03-31 DIAGNOSIS — I10 Essential (primary) hypertension: Secondary | ICD-10-CM

## 2020-03-31 LAB — CBC
HCT: 33 % — ABNORMAL LOW (ref 36.0–46.0)
Hemoglobin: 10 g/dL — ABNORMAL LOW (ref 12.0–15.0)
MCH: 25.7 pg — ABNORMAL LOW (ref 26.0–34.0)
MCHC: 30.3 g/dL (ref 30.0–36.0)
MCV: 84.8 fL (ref 80.0–100.0)
Platelets: 302 10*3/uL (ref 150–400)
RBC: 3.89 MIL/uL (ref 3.87–5.11)
RDW: 15.6 % — ABNORMAL HIGH (ref 11.5–15.5)
WBC: 17.2 10*3/uL — ABNORMAL HIGH (ref 4.0–10.5)
nRBC: 0 % (ref 0.0–0.2)

## 2020-03-31 LAB — BASIC METABOLIC PANEL
Anion gap: 10 (ref 5–15)
BUN: 33 mg/dL — ABNORMAL HIGH (ref 8–23)
CO2: 28 mmol/L (ref 22–32)
Calcium: 8.8 mg/dL — ABNORMAL LOW (ref 8.9–10.3)
Chloride: 96 mmol/L — ABNORMAL LOW (ref 98–111)
Creatinine, Ser: 0.82 mg/dL (ref 0.44–1.00)
GFR, Estimated: >60 mL/min (ref >60)
Glucose, Bld: 250 mg/dL — ABNORMAL HIGH (ref 70–99)
Potassium: 4.3 mmol/L (ref 3.5–5.1)
Sodium: 134 mmol/L — ABNORMAL LOW (ref 135–145)

## 2020-03-31 LAB — GLUCOSE, CAPILLARY
Glucose-Capillary: 198 mg/dL — ABNORMAL HIGH (ref 70–99)
Glucose-Capillary: 233 mg/dL — ABNORMAL HIGH (ref 70–99)
Glucose-Capillary: 228 mg/dL — ABNORMAL HIGH (ref 70–99)
Glucose-Capillary: 242 mg/dL — ABNORMAL HIGH (ref 70–99)

## 2020-03-31 LAB — HEMOGLOBIN A1C
Hgb A1c MFr Bld: 7.4 % — ABNORMAL HIGH (ref 4.8–5.6)
Mean Plasma Glucose: 165.68 mg/dL

## 2020-03-31 LAB — HIV ANTIBODY (ROUTINE TESTING W REFLEX): HIV Screen 4th Generation wRfx: NONREACTIVE

## 2020-03-31 MED ORDER — GUAIFENESIN-DM 100-10 MG/5ML PO SYRP
5.0000 mL | ORAL_SOLUTION | ORAL | Status: DC | PRN
  Administered 2020-03-31 (×2): 5 mL via ORAL
  Filled 2020-03-31 (×2): qty 5

## 2020-03-31 MED ORDER — HYDROCODONE-HOMATROPINE 5-1.5 MG/5ML PO SYRP
5.0000 mL | ORAL_SOLUTION | Freq: Four times a day (QID) | ORAL | Status: DC | PRN
  Administered 2020-03-31 – 2020-04-03 (×6): 5 mL via ORAL
  Filled 2020-03-31 (×6): qty 5

## 2020-03-31 MED ORDER — INSULIN ASPART PROT & ASPART (70-30 MIX) 100 UNIT/ML ~~LOC~~ SUSP
20.0000 [IU] | Freq: Two times a day (BID) | SUBCUTANEOUS | Status: DC
  Administered 2020-03-31 – 2020-04-03 (×7): 20 [IU] via SUBCUTANEOUS
  Filled 2020-03-31 (×7): qty 10

## 2020-03-31 MED ORDER — LOSARTAN POTASSIUM 25 MG PO TABS
25.0000 mg | ORAL_TABLET | Freq: Every day | ORAL | Status: DC
  Administered 2020-03-31 – 2020-04-02 (×3): 25 mg via ORAL
  Filled 2020-03-31 (×3): qty 1

## 2020-03-31 MED ORDER — POLYETHYLENE GLYCOL 3350 17 G PO PACK
17.0000 g | PACK | Freq: Every day | ORAL | Status: DC
  Administered 2020-03-31 – 2020-04-03 (×3): 17 g via ORAL
  Filled 2020-03-31 (×4): qty 1

## 2020-03-31 MED ORDER — PREDNISONE 20 MG PO TABS
40.0000 mg | ORAL_TABLET | Freq: Every day | ORAL | Status: DC
  Administered 2020-03-31 – 2020-04-02 (×3): 40 mg via ORAL
  Filled 2020-03-31 (×3): qty 2

## 2020-03-31 NOTE — Progress Notes (Addendum)
Progress Note    Peggy [name]  JQZ:[id number] DOB: 1928-11-02  DOA: 03/30/2020 PCP: Lauro Regulus, MD      Brief Narrative:    Medical records reviewed and are as summarized below:  Peggy [name] is a 86 y.o. female       Assessment/Plan:   Principal Problem:   COPD with acute exacerbation (HCC) Active Problems:   Essential (primary) hypertension   AF (paroxysmal atrial fibrillation) (HCC)   Chronic respiratory failure with hypoxia (HCC)   Elevated troponin level   Leukocytosis   Body mass index is 31.39 kg/m. (Obesity)   COPD exacerbation: Continue prednisone and bronchodilators. She has already had 2 rounds of antibiotics so we'll avoid any antibiotics at this time. Hycodan syrup as needed for cough  Chronic hypoxemic respiratory failure: She is normally on 4 L/min oxygen via nasal cannula at home. Currently, she is tolerating 3 L/min oxygen.  Elevated troponin: Probably due to demand ischemia. 2D echo is pending. Continue aspirin, metoprolol and nitrates. Follow-up with cardiologist for further recommendation.  Type II DM with hyperglycemia: Resume home dose of insulin 70/30. Humalog as needed for hyperglycemia.  Paroxysmal atrial fibrillation: Continue aspirin. Follow-up with cardiologist with recommendations.  Leukocytosis: This is likely due to recent use of prednisone.  History of TIA and stroke: Continue aspirin  Other comorbidities include hypertension, GERD, hyperlipidemia. Continue home meds.   Diet Order            Diet Carb Modified Fluid consistency: Thin; Room service appropriate? Yes  Diet effective now                    Consultants:  Cardiologist  Procedures:  None    Medications:   . acidophilus  2 capsule Oral Daily  . vitamin C with rose hips  1,000 mg Oral Daily  . aspirin EC  81 mg Oral Daily  . budesonide (PULMICORT) nebulizer solution  0.25 mg Nebulization BID  . cholecalciferol  5,000 Units Oral Daily   . darifenacin  7.5 mg Oral Daily  . enoxaparin (LOVENOX) injection  40 mg Subcutaneous Q24H  . gabapentin  100 mg Oral Q2000  . insulin aspart  0-20 Units Subcutaneous TID WC  . ipratropium-albuterol  3 mL Nebulization Q6H  . isosorbide mononitrate  30 mg Oral Daily  . levothyroxine  50 mcg Oral Daily  . metoprolol succinate  100 mg Oral Daily  . pantoprazole  40 mg Oral Daily  . psyllium  1 packet Oral Daily  . sodium chloride flush  3 mL Intravenous Q12H  . vitamin B-12  1,000 mcg Oral Daily   Continuous Infusions: . sodium chloride       Anti-infectives (From admission, onward)   None             Family Communication/Anticipated D/C date and plan/Code Status   DVT prophylaxis: enoxaparin (LOVENOX) injection 40 mg Start: 03/30/20 1500     Code Status: Full Code  Family Communication: Plan discussed with her daughter, Marcelino Duster, at the bedside Disposition Plan:    Status is: Inpatient  Remains inpatient appropriate because:Inpatient level of care appropriate due to severity of illness   Dispo: The patient is from: Home              Anticipated d/c is to: Home              Anticipated d/c date is: 1 day  Patient currently is not medically stable to d/c.           Subjective:   C/o severe cough but unable to get any phlegm out. She said she had a bad night because of the cough. Breathing is a little better.  Objective:    Vitals:   03/30/20 2033 03/30/20 2300 03/31/20 0318 03/31/20 0749  BP: (!) 146/72  (!) 152/48 (!) 160/57  Pulse: 73  65 61  Resp: 20  16 19   Temp: 97.8 F (36.6 C)  97.6 F (36.4 C) 97.7 F (36.5 C)  TempSrc: Oral  Oral Oral  SpO2: 100%  97% 99%  Weight:  77.8 kg    Height:       No data found.   Intake/Output Summary (Last 24 hours) at 03/31/2020 1020 Last data filed at 03/30/2020 2345 Gross per 24 hour  Intake --  Output 500 ml  Net -500 ml   Filed Weights   03/30/20 0809 03/30/20 2300   Weight: 79.2 kg 77.8 kg    Exam:  GEN: NAD SKIN: No rash EYES: EOMI ENT: MMM CV: RRR PULM: CTA B ABD: soft, obese, NT, +BS CNS: AAO x 3, non focal EXT: No edema or tenderness   Data Reviewed:   I have personally reviewed following labs and imaging studies:  Labs: Labs show the following:   Basic Metabolic Panel: Recent Labs  Lab 03/30/20 1045 03/31/20 0503  NA 137 134*  K 4.2 4.3  CL 101 96*  CO2 26 28  GLUCOSE 101* 250*  BUN 34* 33*  CREATININE 0.98 0.82  CALCIUM 8.8* 8.8*   GFR Estimated Creatinine Clearance: 44.9 mL/min (by C-G formula based on SCr of 0.82 mg/dL). Liver Function Tests: No results for input(s): AST, ALT, ALKPHOS, BILITOT, PROT, ALBUMIN in the last 168 hours. No results for input(s): LIPASE, AMYLASE in the last 168 hours. No results for input(s): AMMONIA in the last 168 hours. Coagulation profile No results for input(s): INR, PROTIME in the last 168 hours.  CBC: Recent Labs  Lab 03/30/20 1045 03/31/20 0503  WBC 20.7* 17.2*  HGB 10.5* 10.0*  HCT 34.0* 33.0*  MCV 84.2 84.8  PLT 351 302   Cardiac Enzymes: No results for input(s): CKTOTAL, CKMB, CKMBINDEX, TROPONINI in the last 168 hours. BNP (last 3 results) No results for input(s): PROBNP in the last 8760 hours. CBG: Recent Labs  Lab 03/30/20 1851 03/30/20 2107 03/31/20 0750  GLUCAP 277* 265* 198*   D-Dimer: No results for input(s): DDIMER in the last 72 hours. Hgb A1c: No results for input(s): HGBA1C in the last 72 hours. Lipid Profile: No results for input(s): CHOL, HDL, LDLCALC, TRIG, CHOLHDL, LDLDIRECT in the last 72 hours. Thyroid function studies: No results for input(s): TSH, T4TOTAL, T3FREE, THYROIDAB in the last 72 hours.  Invalid input(s): FREET3 Anemia work up: No results for input(s): VITAMINB12, FOLATE, FERRITIN, TIBC, IRON, RETICCTPCT in the last 72 hours. Sepsis Labs: Recent Labs  Lab 03/30/20 1045 03/31/20 0503  WBC 20.7* 17.2*     Microbiology Recent Results (from the past 240 hour(s))  Respiratory Panel by RT PCR (Flu A&B, Covid) - Nasopharyngeal Swab     Status: None   Collection Time: 03/30/20  2:47 PM   Specimen: Nasopharyngeal Swab  Result Value Ref Range Status   SARS Coronavirus 2 by RT PCR NEGATIVE NEGATIVE Final    Comment: (NOTE) SARS-CoV-2 target nucleic acids are NOT DETECTED.  The SARS-CoV-2 RNA is generally detectable in upper respiratoy  specimens during the acute phase of infection. The lowest concentration of SARS-CoV-2 viral copies this assay can detect is 131 copies/mL. A negative result does not preclude SARS-Cov-2 infection and should not be used as the sole basis for treatment or other patient management decisions. A negative result may occur with  improper specimen collection/handling, submission of specimen other than nasopharyngeal swab, presence of viral mutation(s) within the areas targeted by this assay, and inadequate number of viral copies (<131 copies/mL). A negative result must be combined with clinical observations, patient history, and epidemiological information. The expected result is Negative.  Fact Sheet for Patients:  https://www.moore.com/  Fact Sheet for Healthcare Providers:  https://www.young.biz/  This test is no t yet approved or cleared by the [place] FDA and  has been authorized for detection and/or diagnosis of SARS-CoV-2 by FDA under an Emergency Use Authorization (EUA). This EUA will remain  in effect (meaning this test can be used) for the duration of the COVID-19 declaration under Section 564(b)(1) of the Act, 21 U.S.C. section 360bbb-3(b)(1), unless the authorization is terminated or revoked sooner.     Influenza A by PCR NEGATIVE NEGATIVE Final   Influenza B by PCR NEGATIVE NEGATIVE Final    Comment: (NOTE) The Xpert Xpress SARS-CoV-2/FLU/RSV assay is intended as an aid in  the diagnosis of influenza  from Nasopharyngeal swab specimens and  should not be used as a sole basis for treatment. Nasal washings and  aspirates are unacceptable for Xpert Xpress SARS-CoV-2/FLU/RSV  testing.  Fact Sheet for Patients: https://www.moore.com/  Fact Sheet for Healthcare Providers: https://www.young.biz/  This test is not yet approved or cleared by the [place] FDA and  has been authorized for detection and/or diagnosis of SARS-CoV-2 by  FDA under an Emergency Use Authorization (EUA). This EUA will remain  in effect (meaning this test can be used) for the duration of the  Covid-19 declaration under Section 564(b)(1) of the Act, 21  U.S.C. section 360bbb-3(b)(1), unless the authorization is  terminated or revoked. Performed at [facility], [address] [place], [place] [address]     Procedures and diagnostic studies:  DG Chest 2 View  Result Date: 03/30/2020 CLINICAL DATA:  Shortness of breath for 3 weeks EXAM: CHEST - 2 VIEW COMPARISON:  03/22/2020 FINDINGS: Cardiac silhouette remains mildly enlarged. Atherosclerotic calcification of the aortic knob. Mildly hyperexpanded lungs. Chronically coarsened interstitial markings bilaterally. No focal airspace consolidation. No pleural effusion or pneumothorax. IMPRESSION: Stable exam.  No acute cardiopulmonary process. Electronically Signed   By: Duanne Guess D.O.   On: 03/30/2020 12:55               LOS: 1 day   Winfred Iiams  [facility]   Pager on [email or web address]. If 7PM-7AM, please contact night-coverage at [email or web address]     03/31/2020, 10:20 AM

## 2020-03-31 NOTE — Progress Notes (Signed)
[facility]    SUBJECTIVE: Patient states to feel much better reduced shortness of breath and dyspnea still has somewhat of a cough no sputum production no fever chills or sweats patient now ready for further management feels well enough to be discharged home   Vitals:   03/30/20 2033 03/30/20 2300 03/31/20 0318 03/31/20 0749  BP: (!) 146/72  (!) 152/48 (!) 160/57  Pulse: 73  65 61  Resp: 20  16 19   Temp: 97.8 F (36.6 C)  97.6 F (36.4 C) 97.7 F (36.5 C)  TempSrc: Oral  Oral Oral  SpO2: 100%  97% 99%  Weight:  77.8 kg    Height:         Intake/Output Summary (Last 24 hours) at 03/31/2020 1022 Last data filed at 03/30/2020 2345 Gross per 24 hour  Intake --  Output 500 ml  Net -500 ml      PHYSICAL EXAM  General: Well developed, well nourished, in no acute distress HEENT:  Normocephalic and atramatic Neck:  No JVD.  Lungs: Clear bilaterally to auscultation and percussion. Heart: HRRR . Normal S1 and S2 without gallops or murmurs.  Abdomen: Bowel sounds are positive, abdomen soft and non-tender  Msk:  Back normal, normal gait. Normal strength and tone for age. Extremities: No clubbing, cyanosis or edema.   Neuro: Alert and oriented X 3. Psych:  Good affect, responds appropriately   LABS: Basic Metabolic Panel: Recent Labs    03/30/20 1045 03/31/20 0503  NA 137 134*  K 4.2 4.3  CL 101 96*  CO2 26 28  GLUCOSE 101* 250*  BUN 34* 33*  CREATININE 0.98 0.82  CALCIUM 8.8* 8.8*   Liver Function Tests: No results for input(s): AST, ALT, ALKPHOS, BILITOT, PROT, ALBUMIN in the last 72 hours. No results for input(s): LIPASE, AMYLASE in the last 72 hours. CBC: Recent Labs    03/30/20 1045 03/31/20 0503  WBC 20.7* 17.2*  HGB 10.5* 10.0*  HCT 34.0* 33.0*  MCV 84.2 84.8  PLT 351 302   Cardiac Enzymes: No results for input(s): CKTOTAL, CKMB, CKMBINDEX, TROPONINI in the last 72 hours. BNP: Invalid input(s): POCBNP D-Dimer: No results for input(s):  DDIMER in the last 72 hours. Hemoglobin A1C: No results for input(s): HGBA1C in the last 72 hours. Fasting Lipid Panel: No results for input(s): CHOL, HDL, LDLCALC, TRIG, CHOLHDL, LDLDIRECT in the last 72 hours. Thyroid Function Tests: No results for input(s): TSH, T4TOTAL, T3FREE, THYROIDAB in the last 72 hours.  Invalid input(s): FREET3 Anemia Panel: No results for input(s): VITAMINB12, FOLATE, FERRITIN, TIBC, IRON, RETICCTPCT in the last 72 hours.  DG Chest 2 View  Result Date: 03/30/2020 CLINICAL DATA:  Shortness of breath for 3 weeks EXAM: CHEST - 2 VIEW COMPARISON:  03/22/2020 FINDINGS: Cardiac silhouette remains mildly enlarged. Atherosclerotic calcification of the aortic knob. Mildly hyperexpanded lungs. Chronically coarsened interstitial markings bilaterally. No focal airspace consolidation. No pleural effusion or pneumothorax. IMPRESSION: Stable exam.  No acute cardiopulmonary process. Electronically Signed   By: 03/24/2020 D.O.   On: 03/30/2020 12:55     Echo pending  TELEMETRY: Normal sinus rhythm rate of 65 nonspecific  ASSESSMENT AND PLAN:  Principal Problem:   COPD with acute exacerbation (HCC) Active Problems:   Essential (primary) hypertension   AF (paroxysmal atrial fibrillation) (HCC)   Chronic respiratory failure with hypoxia (HCC)   Elevated troponin level   Leukocytosis    Plan  Recommend continue pulmonary support for COPD inhalers steroid therapy possible antibiotics  Consider pulmonary input for aggressive COPD management Consider oxygen as needed Therapy for cough congestion Recommend low-dose ARB blood pressure heart failure therapy Atrial fibrillation paroxysmal consider anticoagulation Eliquis 5 mg twice a day Agree with diabetes management and control Maintain Protonix therapy for reflux symptoms Have the patient follow-up with cardiology and pulmonary as an outpatient Await echocardiogram for assessment of left ventricular function and  valvular structures   Alwyn Pea, MD 03/31/2020 10:22 AM

## 2020-03-31 NOTE — Evaluation (Signed)
Occupational Therapy Evaluation Patient Details Name: Peggy [name] MRN: [id number] DOB: 1928/08/23 Today's Date: 03/31/2020    History of Present Illness 86 y.o. female with a past medical history of COPD on 4 L 24/7, gastric reflux, hypertension, hyperlipidemia, presents to the emergency department for worsening shortness of breath.  According to the patient and her daughter for the past several weeks the patient has had worsening shortness of breath especially with minimal exertion.  Patient states intermittent chest tightness.  States she cannot lie flat because she becomes too short of breath.  Denies any fever.  Patient has been following up with her pulmonologist Dr. Welton Flakes.  They have tried outpatient antibiotics x2, outpatient steroids.   Clinical Impression   Patient presenting with decreased I in self care, balance, functional mobility/transfers, endurance, and safety awareness. Patient reports being mod I with use of SPC or RW PTA. Family stops by each day to assist with medications and other IADL tasks per pt report. She did not need assistance with self care tasks at baseline. Patient currently functioning at supervision - min guard without AD this session. Patient will benefit from acute OT to increase overall independence in the areas of ADLs, functional mobility, and safety awareness in order to safely discharge home.    Follow Up Recommendations  No OT follow up;Supervision - Intermittent    Equipment Recommendations  None recommended by OT    Recommendations for Other Services Other (comment) (none at this time)     Precautions / Restrictions Precautions Precautions: Fall Restrictions Weight Bearing Restrictions: No      Mobility Bed Mobility Overal bed mobility: Modified Independent      Transfers Overall transfer level: Needs assistance Equipment used: 1 person hand held assist Transfers: Sit to/from Stand;Stand Pivot Transfers Sit to Stand: Supervision Stand  pivot transfers: Min guard       General transfer comment: min cuing for technique    Balance Overall balance assessment: Needs assistance Sitting-balance support: Feet supported Sitting balance-Leahy Scale: Good     Standing balance support: During functional activity;Single extremity supported Standing balance-Leahy Scale: Fair            ADL either performed or assessed with clinical judgement   ADL Overall ADL's : Needs assistance/impaired Eating/Feeding: Independent   Grooming: Wash/dry hands;Wash/dry face;Oral care;Supervision/safety;Standing     Lower Body Dressing: Min guard;Sitting/lateral leans   Toilet Transfer: Min guard;Ambulation     General ADL Comments: Min guard for functional transfer without use of RW. Pt performing all aspects of self care with S - min guard this session.     Vision Patient Visual Report: No change from baseline              Pertinent Vitals/Pain Pain Assessment: No/denies pain     Hand Dominance Right   Extremity/Trunk Assessment Upper Extremity Assessment Upper Extremity Assessment: Overall WFL for tasks assessed;Generalized weakness   Lower Extremity Assessment Lower Extremity Assessment: Defer to PT evaluation       Communication Communication Communication: HOH   Cognition Arousal/Alertness: Awake/alert Behavior During Therapy: WFL for tasks assessed/performed Overall Cognitive Status: Within Functional Limits for tasks assessed                    Home Living Family/patient expects to be discharged to:: Private residence Living Arrangements: Alone Available Help at Discharge: Family;Available PRN/intermittently Type of Home: House Home Access: Stairs to enter [employer] of Steps: 3 Entrance Stairs-Rails: Left Home Layout: One level  Bathroom Shower/Tub: Chief Strategy Officer: Handicapped height Bathroom Accessibility: Yes How Accessible: Accessible via walker Home  Equipment: Walker - 4 wheels;Grab bars - tub/shower;Cane - single point;Grab bars - toilet          Prior Functioning/Environment Level of Independence: Independent with assistive device(s)        Comments: She reports using SPB mostly but sometimes utilizes RW when feeling "weaker" or walking community distances. Pt reports being I in all aspects of ADLs. Paid person cleans home and family checks in every day to assist with medication and other IADL tasks.        OT Problem List: Decreased strength;Decreased coordination;Decreased activity tolerance;Decreased safety awareness;Decreased knowledge of use of DME or AE;Impaired balance (sitting and/or standing);Cardiopulmonary status limiting activity      OT Treatment/Interventions: Self-care/ADL training;Therapeutic exercise;Therapeutic activities;Energy conservation;Patient/family education;DME and/or AE instruction;Balance training    OT Goals(Current goals can be found in the care plan section) Acute Rehab OT Goals Patient Stated Goal: to go home OT Goal Formulation: With patient Time For Goal Achievement: 04/14/20 Potential to Achieve Goals: Fair ADL Goals Pt Will Perform Grooming: with modified independence;standing Pt Will Transfer to Toilet: with modified independence;ambulating Pt Will Perform Toileting - Clothing Manipulation and hygiene: with modified independence;sit to/from stand Pt Will Perform Tub/Shower Transfer: with supervision;ambulating  OT Frequency: Min 1X/week   Barriers to D/C:    none known at this time          AM-PAC OT "6 Clicks" Daily Activity     Outcome Measure Help from another person eating meals?: None Help from another person taking care of personal grooming?: None Help from another person toileting, which includes using toliet, bedpan, or urinal?: A Little Help from another person bathing (including washing, rinsing, drying)?: A Little Help from another person to put on and taking off  regular upper body clothing?: None Help from another person to put on and taking off regular lower body clothing?: A Little 6 Click Score: 21   End of Session Nurse Communication: Mobility status  Activity Tolerance: Patient tolerated treatment well Patient left: in bed;with call bell/phone within reach;with bed alarm set  OT Visit Diagnosis: Unsteadiness on feet (R26.81);Muscle weakness (generalized) (M62.81)                Time: 1448-1856 OT Time Calculation (min): 13 min Charges:  OT General Charges $OT Visit: 1 Visit OT Evaluation $OT Eval Low Complexity: 1 Low  Jackquline Denmark, MS, OTR/L , CBIS ascom [phone]  03/31/20, 11:40 AM

## 2020-03-31 NOTE — Progress Notes (Signed)
Initial Nutrition Assessment  DOCUMENTATION CODES:   Obesity unspecified  INTERVENTION:  Provide mechanically altered DYS 3 diet   Snacks qHS  Continue MVI with minerals daily  Encouraged po intake    NUTRITION DIAGNOSIS:   Increased nutrient needs related to chronic illness (end-stage COPD on 4L continuous O2) as evidenced by estimated needs.    GOAL:   Patient will meet greater than or equal to 90% of their needs    MONITOR:   Labs, I & O's, PO intake, Weight trends  REASON FOR ASSESSMENT:   Consult Assessment of nutrition requirement/status  ASSESSMENT:  RD working remotely.   86 year old female with history significant of end-stage COPD on 4 L continuous oxygen at baseline, GERD, HTN, HLD and atrial fibrillation presented with worsening shortness of breath from baseline s/p 2 courses ofoutpt antibiotic therapy as well as systemic steroids without improvement. Pt admitted with acute COPD exacerbation.  Spoke with pt via phone this afternoon. She reports feeling better today and states she really enjoyed her lunch. Pt recalls pot roast, creamed pots, and pintos, says she ate almost all of it. Pt reports having a good appetite at baseline, states she has been taking steroids and eats everything in site. She does endorse some chewing difficulty secondary to poorly fitting bottom dentures and agreeable to mechanically altered diet for ease of intake.   Per chart, weights have remained stable over the past year.  Medications reviewed and include: Risaquad, Vit C, D3, Enablex, Novolog 70/30 20 units BID, Imdur, Miralax, Prednisone, Psyllium, B12  Labs: CBGs 233,198,265, Na 134 (L), BUN 33 (H), WBC 17.2 (H), BNP 395.9 (H) Last A1c 7.7 on 03/07/19  NUTRITION - FOCUSED PHYSICAL EXAM: Unable to complete at this time, RD working remotely.  Diet Order:   Diet Order            Diet Carb Modified Fluid consistency: Thin; Room service appropriate? Yes  Diet effective now                  EDUCATION NEEDS:   Education needs have been addressed  Skin:  Skin Assessment: Reviewed RN Assessment  Last BM:  pta  Height:   Ht Readings from Last 1 Encounters:  03/30/20 5\' 2"  (1.575 m)    Weight:   Wt Readings from Last 1 Encounters:  03/30/20 77.8 kg    BMI:  Body mass index is 31.39 kg/m.  Estimated Nutritional Needs:   Kcal:  1700-1900  Protein:  86-94  Fluid:  >/= 1.7 L/day   04/01/20, RD, LDN Clinical Nutrition After Hours/Weekend Pager # in Amion

## 2020-03-31 NOTE — Evaluation (Signed)
Physical Therapy Evaluation Patient Details Name: Peggy [name] MRN: [id number] DOB: 06/07/1928 Today's Date: 03/31/2020   History of Present Illness  Pt is an 86 y/o F admitted on 03/30/20 for worsening SOB & intermittent chest tightness despite trying 2 antibiotics perscribed by outpatient pulmonologist. PMH: COPD on 4L/min at home (24/7), gastric reflux, HTN, HLD, a-fib, arthritis, DM, stroke, TIA  Clinical Impression  Pt agreeable to tx. Pt received standing by counter in room without supplemental oxygen donned, SpO2 >90%. Educated pt on need to don nasal cannula as she has chronically used supplemental oxygen & pt agreeable & on 3L/min throughout remainder of session. Pt's daughter in room but exits shortly after PT arrival. Pt ambulates 180 ft without AD & supervision + 180 ft with SPC in RUE & supervision but does require min assist during both trials 2/2 LOB to R. Pt would benefit from ongoing PT services to address high level balance deficits to ensure safety with mobility & reduced fall risk.  Pt's SpO2 >90% on 3L/min.    Follow Up Recommendations Outpatient PT;Supervision for mobility/OOB    Equipment Recommendations  None recommended by PT (pt has RW & SPC at home)    Recommendations for Other Services       Precautions / Restrictions Precautions Precautions: Fall Restrictions Weight Bearing Restrictions: No      Mobility  Bed Mobility Overal bed mobility: Modified Independent (HOB elevated)                  Transfers Overall transfer level: Modified independent (Sit<>stand) Equipment used: 1 person hand held assist Transfers: Sit to/from [employer] Sit to Stand: Supervision          Ambulation/Gait Ambulation/Gait assistance: Supervision Gait Distance (Feet): 180 Feet Assistive device: None;Straight cane Gait Pattern/deviations: Decreased weight shift to left     General Gait Details: pt ambulates with supervision overall without  AD & with SPC except experiences 1 LOB to R during each gait trial requiring min assist to correct  Stairs            Wheelchair Mobility    Modified Rankin (Stroke Patients Only)       Balance Overall balance assessment: Needs assistance Sitting-balance support: Feet supported Sitting balance-Leahy Scale: Normal     Standing balance support: During functional activity;Single extremity supported Standing balance-Leahy Scale: Good Standing balance comment: able to stand/ambulate without UE support with close supervision but does experience 1 LOB               High Level Balance Comments: high level balance deficits noted as pt with LOB to R during gait with and without AD             Pertinent Vitals/Pain Pain Assessment: No/denies pain    Home Living Family/patient expects to be discharged to:: Private residence Living Arrangements: Children (adult daughter) Available Help at Discharge: Family;Available PRN/intermittently Type of Home: House Home Access: Stairs to enter Entrance Stairs-Rails: Right Entrance Stairs-Number of Steps: 3 Home Layout: One level   Additional Comments: Pt lives with adult daughter who works during the day but another family member comes to stay with pt every day through the week except Friday. Pt doesn't cook/clean but reports she can "do for" herself on Friday.    Prior Function Level of Independence: Independent with assistive device(s)         Comments: Reports using SPC at home, but will use RW in the mornings when she feels "weaker".  Hand Dominance   Dominant Hand: Right    Extremity/Trunk Assessment   Upper Extremity Assessment Upper Extremity Assessment: Generalized weakness    Lower Extremity Assessment Lower Extremity Assessment: Generalized weakness       Communication   Communication: HOH  Cognition Arousal/Alertness: Awake/alert Behavior During Therapy: WFL for tasks assessed/performed Overall  Cognitive Status: Within Functional Limits for tasks assessed                                        General Comments      Exercises     Assessment/Plan    PT Assessment Patient needs continued PT services  PT Problem List Decreased safety awareness;Cardiopulmonary status limiting activity;Decreased balance;Decreased knowledge of use of DME       PT Treatment Interventions DME instruction;Therapeutic exercise;Gait training;Balance training;Stair training;Functional mobility training;Patient/family education;Neuromuscular re-education    PT Goals (Current goals can be found in the Care Plan section)  Acute Rehab PT Goals Patient Stated Goal: to go home PT Goal Formulation: With patient Time For Goal Achievement: 04/13/20 Potential to Achieve Goals: Good    Frequency Min 2X/week   Barriers to discharge        Co-evaluation               AM-PAC PT "6 Clicks" Mobility  Outcome Measure Help needed turning from your back to your side while in a flat bed without using bedrails?: None Help needed moving from lying on your back to sitting on the side of a flat bed without using bedrails?: None Help needed moving to and from a bed to a chair (including a wheelchair)?: A Little Help needed standing up from a chair using your arms (e.g., wheelchair or bedside chair)?: None Help needed to walk in hospital room?: A Little Help needed climbing 3-5 steps with a railing? : A Little 6 Click Score: 21    End of Session Equipment Utilized During Treatment: Gait belt;Oxygen Activity Tolerance: Patient tolerated treatment well Patient left: in bed;with bed alarm set;with call bell/phone within reach   PT Visit Diagnosis: Unsteadiness on feet (R26.81);Other abnormalities of gait and mobility (R26.89)    Time: 4166-0630 PT Time Calculation (min) (ACUTE ONLY): 20 min   Charges:     PT Treatments $Therapeutic Activity: 8-22 mins        [name], PT,  DPT 03/31/20, 3:31 PM   [name] 03/31/2020, 3:29 PM

## 2020-04-01 ENCOUNTER — Inpatient Hospital Stay
Admit: 2020-04-01 | Discharge: 2020-04-01 | Disposition: A | Discharge status: Home / Self Care | Payer: Medicare Other | Attending: Internal Medicine | Admitting: Internal Medicine

## 2020-04-01 DIAGNOSIS — I480 Paroxysmal atrial fibrillation: Secondary | ICD-10-CM

## 2020-04-01 LAB — GLUCOSE, CAPILLARY
Glucose-Capillary: 170 mg/dL — ABNORMAL HIGH (ref 70–99)
Glucose-Capillary: 205 mg/dL — ABNORMAL HIGH (ref 70–99)
Glucose-Capillary: 189 mg/dL — ABNORMAL HIGH (ref 70–99)

## 2020-04-01 LAB — RESP PANEL BY RT PCR (RSV, FLU A&B, COVID)
Influenza A by PCR: NEGATIVE
Influenza B by PCR: NEGATIVE
Respiratory Syncytial Virus by PCR: NEGATIVE
SARS Coronavirus 2 by RT PCR: NEGATIVE

## 2020-04-01 MED ORDER — APIXABAN 5 MG PO TABS
5.0000 mg | ORAL_TABLET | Freq: Two times a day (BID) | ORAL | Status: DC
  Administered 2020-04-01 – 2020-04-03 (×4): 5 mg via ORAL
  Filled 2020-04-01 (×4): qty 1

## 2020-04-01 MED ORDER — METOPROLOL TARTRATE 5 MG/5ML IV SOLN
5.0000 mg | Freq: Four times a day (QID) | INTRAVENOUS | Status: DC | PRN
  Administered 2020-04-01: 5 mg via INTRAVENOUS
  Filled 2020-04-01: qty 5

## 2020-04-01 MED ORDER — HYDROCOD POLST-CPM POLST ER 10-8 MG/5ML PO SUER: 5.0000 mL | Freq: Two times a day (BID) | ORAL | 0 refills | Status: DC | PRN

## 2020-04-01 MED ORDER — GUAIFENESIN-DM 100-10 MG/5ML PO SYRP
10.0000 mL | ORAL_SOLUTION | Freq: Three times a day (TID) | ORAL | Status: DC
  Administered 2020-04-01 – 2020-04-03 (×8): 10 mL via ORAL
  Filled 2020-04-01 (×8): qty 10

## 2020-04-01 MED ORDER — HUMULIN 70/30 KWIKPEN (70-30) 100 UNIT/ML ~~LOC~~ SUPN: 35.0000 [IU] | PEN_INJECTOR | Freq: Two times a day (BID) | SUBCUTANEOUS | Status: DC

## 2020-04-01 MED ORDER — PREDNISONE 20 MG PO TABS: 40.0000 mg | ORAL_TABLET | Freq: Every day | ORAL | 0 refills | Status: DC

## 2020-04-01 MED ORDER — TROSPIUM CHLORIDE 20 MG PO TABS
20.0000 mg | ORAL_TABLET | Freq: Every day | ORAL | Status: DC
Start: 2020-04-01 — End: 2021-01-31

## 2020-04-01 MED ORDER — GUAIFENESIN-DM 100-10 MG/5ML PO SYRP
10.0000 mL | ORAL_SOLUTION | Freq: Three times a day (TID) | ORAL | 0 refills | Status: DC
Start: 2020-04-01 — End: 2020-04-12

## 2020-04-01 MED ORDER — BENZONATATE 100 MG PO CAPS
200.0000 mg | ORAL_CAPSULE | Freq: Three times a day (TID) | ORAL | Status: DC | PRN
  Administered 2020-04-01 (×2): 200 mg via ORAL
  Filled 2020-04-01 (×3): qty 2

## 2020-04-01 NOTE — Progress Notes (Signed)
Progress Note    Peggy ALFRED  [name]:[id number] DOB: Mar 18, 1929  DOA: 03/30/2020 PCP: Lauro Regulus, MD      Brief Narrative:    Medical records reviewed and are as summarized below:  Peggy [name] is a 86 y.o. female       Assessment/Plan:   Principal Problem:   COPD with acute exacerbation (HCC) Active Problems:   Essential (primary) hypertension   AF (paroxysmal atrial fibrillation) (HCC)   Chronic respiratory failure with hypoxia (HCC)   Elevated troponin level   Leukocytosis   Body mass index is 31.15 kg/m. (Obesity)   COPD exacerbation, severe cough, probable acute on chronic bronchitis: Continue prednisone and bronchodilators.  Chart review showed that patient has been receiving hydrocodone cough syrup since February 2020.  She was prescribed hydrocodone chlorpheniramine cough syrup in January, August and October 2021.  He has been advised to follow-up with the pulmonologist as an outpatient for further evaluation of chronic cough.  Chronic hypoxemic respiratory failure: She says she normally uses 4 L/min oxygen via nasal cannula at home.  Today, she is tolerating 2 L/min oxygen.  She was advised to use the minimal amount of oxygen enough to keep her oxygen saturation above 90%.  Elevated troponin: Probably due to demand ischemia. 2D echo is pending. Continue aspirin, metoprolol and nitrates. Follow-up with cardiologist for further recommendation.  Type II DM with hyperglycemia: Resume home dose of insulin 70/30. Humalog as needed for hyperglycemia.  Paroxysmal atrial fibrillation: Cardiologist recommended Eliquis for stroke prophylaxis.  CHA2DS2-VASc of 8.  Long-term anticoagulation for stroke prophylaxis was discussed.  She said she has used Eliquis in the past and has decided to use Eliquis again for stroke prophylaxis.  Discontinue aspirin.    Leukocytosis: This is likely due to recent use of prednisone.  History of TIA and stroke: She has been  started on Eliquis.  Other comorbidities include hypertension, GERD, hyperlipidemia. Continue home meds.  Original plan was to discharge the patient home today.  However, because of the severe coughing spell associated with shortness of breath and anxiety that she experienced this afternoon, she does not feel comfortable going home today.  Her daughter is also worried that she may decompensate when she gets home today.  She requested that patient be discharged home tomorrow.    Plan discussed with the patient and her daughter Marcelino Duster) at the bedside in detail.   Diet Order            Diet - low sodium heart healthy           Diet Carb Modified           Diet Carb Modified Fluid consistency: Thin; Room service appropriate? Yes  Diet effective now                    Consultants:  Cardiologist  Procedures:  None    Medications:   . acidophilus  2 capsule Oral Daily  . apixaban  5 mg Oral BID  . vitamin C with rose hips  1,000 mg Oral Daily  . budesonide (PULMICORT) nebulizer solution  0.25 mg Nebulization BID  . cholecalciferol  5,000 Units Oral Daily  . darifenacin  7.5 mg Oral Daily  . gabapentin  100 mg Oral Q2000  . guaiFENesin-dextromethorphan  10 mL Oral TID  . insulin aspart  0-20 Units Subcutaneous TID WC  . insulin aspart protamine- aspart  20 Units Subcutaneous BID WC  . ipratropium-albuterol  3 mL Nebulization Q6H  . isosorbide mononitrate  30 mg Oral Daily  . levothyroxine  50 mcg Oral Daily  . losartan  25 mg Oral Daily  . metoprolol succinate  100 mg Oral Daily  . pantoprazole  40 mg Oral Daily  . polyethylene glycol  17 g Oral Daily  . predniSONE  40 mg Oral Q breakfast  . psyllium  1 packet Oral Daily  . sodium chloride flush  3 mL Intravenous Q12H  . vitamin B-12  1,000 mcg Oral Daily   Continuous Infusions: . sodium chloride       Anti-infectives (From admission, onward)   None             Family Communication/Anticipated  D/C date and plan/Code Status   DVT prophylaxis:  apixaban (ELIQUIS) tablet 5 mg     Code Status: Full Code  Family Communication: Plan discussed with her daughter, Marcelino Duster, at the bedside Disposition Plan:    Status is: Inpatient  Remains inpatient appropriate because:Inpatient level of care appropriate due to severity of illness   Dispo: The patient is from: Home              Anticipated d/c is to: Home              Anticipated d/c date is: 1 day              Patient currently is not medically stable to d/c.           Subjective:   C/o severe cough.  She said she had a severe coughing spell in the afternoon and was associated with shortness of breath.  This was confirmed by her nurse who said she found her in a tripod position during the coughing spell.  Objective:    Vitals:   03/31/20 2048 04/01/20 0458 04/01/20 0758 04/01/20 1157  BP: (!) 164/49 (!) 175/64 139/77 133/64  Pulse: 67 (!) 59 61 61  Resp: 18 17 18 19   Temp: 98.1 F (36.7 C) 97.6 F (36.4 C) 97.9 F (36.6 C) 97.6 F (36.4 C)  TempSrc: Oral Oral Oral Oral  SpO2: 99% 100% 100% 99%  Weight:  77.2 kg    Height:       No data found.   Intake/Output Summary (Last 24 hours) at 04/01/2020 1537 Last data filed at 04/01/2020 1429 Gross per 24 hour  Intake 360 ml  Output 1500 ml  Net -1140 ml   Filed Weights   03/30/20 0809 03/30/20 2300 04/01/20 0458  Weight: 79.2 kg 77.8 kg 77.2 kg    Exam:  GEN: NAD SKIN: No rash EYES: EOMI ENT: MMM CV: RRR PULM: CTA B ABD: soft, obese, NT, +BS CNS: AAO x 3, non focal EXT: No edema or tenderness    Data Reviewed:   I have personally reviewed following labs and imaging studies:  Labs: Labs show the following:   Basic Metabolic Panel: Recent Labs  Lab 03/30/20 1045 03/31/20 0503  NA 137 134*  K 4.2 4.3  CL 101 96*  CO2 26 28  GLUCOSE 101* 250*  BUN 34* 33*  CREATININE 0.98 0.82  CALCIUM 8.8* 8.8*   GFR Estimated Creatinine  Clearance: 44.7 mL/min (by C-G formula based on SCr of 0.82 mg/dL). Liver Function Tests: No results for input(s): AST, ALT, ALKPHOS, BILITOT, PROT, ALBUMIN in the last 168 hours. No results for input(s): LIPASE, AMYLASE in the last 168 hours. No results for input(s): AMMONIA in the last 168 hours.  Coagulation profile No results for input(s): INR, PROTIME in the last 168 hours.  CBC: Recent Labs  Lab 03/30/20 1045 03/31/20 0503  WBC 20.7* 17.2*  HGB 10.5* 10.0*  HCT 34.0* 33.0*  MCV 84.2 84.8  PLT 351 302   Cardiac Enzymes: No results for input(s): CKTOTAL, CKMB, CKMBINDEX, TROPONINI in the last 168 hours. BNP (last 3 results) No results for input(s): PROBNP in the last 8760 hours. CBG: Recent Labs  Lab 03/31/20 1145 03/31/20 1702 03/31/20 2049 04/01/20 0754 04/01/20 1200  GLUCAP 233* 228* 242* 170* 205*   D-Dimer: No results for input(s): DDIMER in the last 72 hours. Hgb A1c: Recent Labs    03/30/20 1457  HGBA1C 7.4*   Lipid Profile: No results for input(s): CHOL, HDL, LDLCALC, TRIG, CHOLHDL, LDLDIRECT in the last 72 hours. Thyroid function studies: No results for input(s): TSH, T4TOTAL, T3FREE, THYROIDAB in the last 72 hours.  Invalid input(s): FREET3 Anemia work up: No results for input(s): VITAMINB12, FOLATE, FERRITIN, TIBC, IRON, RETICCTPCT in the last 72 hours. Sepsis Labs: Recent Labs  Lab 03/30/20 1045 03/31/20 0503  WBC 20.7* 17.2*    Microbiology Recent Results (from the past 240 hour(s))  Respiratory Panel by RT PCR (Flu A&B, Covid) - Nasopharyngeal Swab     Status: None   Collection Time: 03/30/20  2:47 PM   Specimen: Nasopharyngeal Swab  Result Value Ref Range Status   SARS Coronavirus 2 by RT PCR NEGATIVE NEGATIVE Final    Comment: (NOTE) SARS-CoV-2 target nucleic acids are NOT DETECTED.  The SARS-CoV-2 RNA is generally detectable in upper respiratoy specimens during the acute phase of infection. The lowest concentration of SARS-CoV-2  viral copies this assay can detect is 131 copies/mL. A negative result does not preclude SARS-Cov-2 infection and should not be used as the sole basis for treatment or other patient management decisions. A negative result may occur with  improper specimen collection/handling, submission of specimen other than nasopharyngeal swab, presence of viral mutation(s) within the areas targeted by this assay, and inadequate number of viral copies (<131 copies/mL). A negative result must be combined with clinical observations, patient history, and epidemiological information. The expected result is Negative.  Fact Sheet for Patients:  https://www.moore.com/  Fact Sheet for Healthcare Providers:  https://www.young.biz/  This test is no t yet approved or cleared by the [place] FDA and  has been authorized for detection and/or diagnosis of SARS-CoV-2 by FDA under an Emergency Use Authorization (EUA). This EUA will remain  in effect (meaning this test can be used) for the duration of the COVID-19 declaration under Section 564(b)(1) of the Act, 21 U.S.C. section 360bbb-3(b)(1), unless the authorization is terminated or revoked sooner.     Influenza A by PCR NEGATIVE NEGATIVE Final   Influenza B by PCR NEGATIVE NEGATIVE Final    Comment: (NOTE) The Xpert Xpress SARS-CoV-2/FLU/RSV assay is intended as an aid in  the diagnosis of influenza from Nasopharyngeal swab specimens and  should not be used as a sole basis for treatment. Nasal washings and  aspirates are unacceptable for Xpert Xpress SARS-CoV-2/FLU/RSV  testing.  Fact Sheet for Patients: https://www.moore.com/  Fact Sheet for Healthcare Providers: https://www.young.biz/  This test is not yet approved or cleared by the [place] FDA and  has been authorized for detection and/or diagnosis of SARS-CoV-2 by  FDA under an Emergency Use Authorization (EUA).  This EUA will remain  in effect (meaning this test can be used) for the duration of the  Covid-19 declaration  under Section 564(b)(1) of the Act, 21  U.S.C. section 360bbb-3(b)(1), unless the authorization is  terminated or revoked. Performed at [facility], [address] [place], [place] [address]   Resp Panel by RT PCR (RSV, Flu A&B, Covid) - Nasopharyngeal Swab     Status: None   Collection Time: 03/30/20  2:47 PM   Specimen: Nasopharyngeal Swab  Result Value Ref Range Status   SARS Coronavirus 2 by RT PCR NEGATIVE NEGATIVE Final    Comment: (NOTE) SARS-CoV-2 target nucleic acids are NOT DETECTED.  The SARS-CoV-2 RNA is generally detectable in upper respiratoy specimens during the acute phase of infection. The lowest concentration of SARS-CoV-2 viral copies this assay can detect is 131 copies/mL. A negative result does not preclude SARS-Cov-2 infection and should not be used as the sole basis for treatment or other patient management decisions. A negative result may occur with  improper specimen collection/handling, submission of specimen other than nasopharyngeal swab, presence of viral mutation(s) within the areas targeted by this assay, and inadequate number of viral copies (<131 copies/mL). A negative result must be combined with clinical observations, patient history, and epidemiological information. The expected result is Negative.  Fact Sheet for Patients:  https://www.moore.com/  Fact Sheet for Healthcare Providers:  https://www.young.biz/  This test is no t yet approved or cleared by the [place] FDA and  has been authorized for detection and/or diagnosis of SARS-CoV-2 by FDA under an Emergency Use Authorization (EUA). This EUA will remain  in effect (meaning this test can be used) for the duration of the COVID-19 declaration under Section 564(b)(1) of the Act, 21 U.S.C. section 360bbb-3(b)(1), unless the  authorization is terminated or revoked sooner.     Influenza A by PCR NEGATIVE NEGATIVE Final   Influenza B by PCR NEGATIVE NEGATIVE Final    Comment: (NOTE) The Xpert Xpress SARS-CoV-2/FLU/RSV assay is intended as an aid in  the diagnosis of influenza from Nasopharyngeal swab specimens and  should not be used as a sole basis for treatment. Nasal washings and  aspirates are unacceptable for Xpert Xpress SARS-CoV-2/FLU/RSV  testing.  Fact Sheet for Patients: https://www.moore.com/  Fact Sheet for Healthcare Providers: https://www.young.biz/  This test is not yet approved or cleared by the [place] FDA and  has been authorized for detection and/or diagnosis of SARS-CoV-2 by  FDA under an Emergency Use Authorization (EUA). This EUA will remain  in effect (meaning this test can be used) for the duration of the  Covid-19 declaration under Section 564(b)(1) of the Act, 21  U.S.C. section 360bbb-3(b)(1), unless the authorization is  terminated or revoked.    Respiratory Syncytial Virus by PCR NEGATIVE NEGATIVE Final    Comment: (NOTE) Fact Sheet for Patients: https://www.moore.com/  Fact Sheet for Healthcare Providers: https://www.young.biz/  This test is not yet approved or cleared by the [place] FDA and  has been authorized for detection and/or diagnosis of SARS-CoV-2 by  FDA under an Emergency Use Authorization (EUA). This EUA will remain  in effect (meaning this test can be used) for the duration of the  COVID-19 declaration under Section 564(b)(1) of the Act, 21 U.S.C.  section 360bbb-3(b)(1), unless the authorization is terminated or  revoked. Performed at [facility], [address] [place], [place] [address]     Procedures and diagnostic studies:  No results found.             LOS: 2 days   Lavine Hargrove  [facility]   Pager on  [email or web address] If  7PM-7AM, please contact night-coverage at [email or web address]     04/01/2020, 3:37 PM

## 2020-04-01 NOTE — Progress Notes (Signed)
Notified E Ouma  NP of rhythm change from NSR to Afib RVR with rate 130-150's. New orders received for metoprolol 5mg  IVP.

## 2020-04-01 NOTE — Progress Notes (Signed)
[facility] Cardiology    SUBJECTIVE: Patient states her breathing is much better she still has a cough denies any sputum production no leg edema feels much better though   Vitals:   03/31/20 2009 03/31/20 2048 04/01/20 0458 04/01/20 0758  BP:  (!) 164/49 (!) 175/64 139/77  Pulse:  67 (!) 59 61  Resp:  18 17 18   Temp:  98.1 F (36.7 C) 97.6 F (36.4 C) 97.9 F (36.6 C)  TempSrc:  Oral Oral Oral  SpO2: 97% 99% 100% 100%  Weight:   77.2 kg   Height:         Intake/Output Summary (Last 24 hours) at 04/01/2020 1041 Last data filed at 04/01/2020 1032 Gross per 24 hour  Intake 240 ml  Output 500 ml  Net -260 ml      PHYSICAL EXAM  General: Well developed, well nourished, in no acute distress HEENT:  Normocephalic and atramatic Neck:  No JVD.  Lungs: Clear bilaterally to auscultation and percussion. Heart: HRRR . Normal S1 and S2 without gallops or murmurs.  Abdomen: Bowel sounds are positive, abdomen soft and non-tender  Msk:  Back normal, normal gait. Normal strength and tone for age. Extremities: No clubbing, cyanosis or edema.   Neuro: Alert and oriented X 3. Psych:  Good affect, responds appropriately   LABS: Basic Metabolic Panel: Recent Labs    03/30/20 1045 03/31/20 0503  NA 137 134*  K 4.2 4.3  CL 101 96*  CO2 26 28  GLUCOSE 101* 250*  BUN 34* 33*  CREATININE 0.98 0.82  CALCIUM 8.8* 8.8*   Liver Function Tests: No results for input(s): AST, ALT, ALKPHOS, BILITOT, PROT, ALBUMIN in the last 72 hours. No results for input(s): LIPASE, AMYLASE in the last 72 hours. CBC: Recent Labs    03/30/20 1045 03/31/20 0503  WBC 20.7* 17.2*  HGB 10.5* 10.0*  HCT 34.0* 33.0*  MCV 84.2 84.8  PLT 351 302   Cardiac Enzymes: No results for input(s): CKTOTAL, CKMB, CKMBINDEX, TROPONINI in the last 72 hours. BNP: Invalid input(s): POCBNP D-Dimer: No results for input(s): DDIMER in the last 72 hours. Hemoglobin A1C: Recent Labs    03/30/20 1457  HGBA1C 7.4*    Fasting Lipid Panel: No results for input(s): CHOL, HDL, LDLCALC, TRIG, CHOLHDL, LDLDIRECT in the last 72 hours. Thyroid Function Tests: No results for input(s): TSH, T4TOTAL, T3FREE, THYROIDAB in the last 72 hours.  Invalid input(s): FREET3 Anemia Panel: No results for input(s): VITAMINB12, FOLATE, FERRITIN, TIBC, IRON, RETICCTPCT in the last 72 hours.  DG Chest 2 View  Result Date: 03/30/2020 CLINICAL DATA:  Shortness of breath for 3 weeks EXAM: CHEST - 2 VIEW COMPARISON:  03/22/2020 FINDINGS: Cardiac silhouette remains mildly enlarged. Atherosclerotic calcification of the aortic knob. Mildly hyperexpanded lungs. Chronically coarsened interstitial markings bilaterally. No focal airspace consolidation. No pleural effusion or pneumothorax. IMPRESSION: Stable exam.  No acute cardiopulmonary process. Electronically Signed   By: 03/24/2020 D.O.   On: 03/30/2020 12:55     Echo preserved left ventricular function EF around 60%  TELEMETRY: Normal sinus rhythm heart rate 66 nonspecific findings  ASSESSMENT AND PLAN:  Principal Problem:   COPD with acute exacerbation (HCC) Active Problems:   Essential (primary) hypertension   AF (paroxysmal atrial fibrillation) (HCC)   Chronic respiratory failure with hypoxia (HCC)   Elevated troponin level   Leukocytosis Cough Acute renal insufficiency  Plan Moderate to severe COPD with congestion cough continue inhalers steroids would recommend adding Mucinex Recommend pulmonary  input for aggressive therapy inpatient and outpatient Antibiotic therapy to help with possible bronchitis Recommend long-term anticoagulation with Eliquis because of A. fib 5 mg twice a day Continue diabetes management and control Increase activity recommend ambulate in hall Consider nephrology follow-up for renal insufficiency    Alwyn Pea, MD 04/01/2020 10:41 AM

## 2020-04-01 NOTE — Progress Notes (Signed)
[facility] Cardiology    SUBJECTIVE: Patient states to be doing better reduced shortness of breath reduce cough.  Would like Mucinex for cough and congestion symptoms   Vitals:   03/31/20 2009 03/31/20 2048 04/01/20 0458 04/01/20 0758  BP:  (!) 164/49 (!) 175/64 139/77  Pulse:  67 (!) 59 61  Resp:  18 17 18   Temp:  98.1 F (36.7 C) 97.6 F (36.4 C) 97.9 F (36.6 C)  TempSrc:  Oral Oral Oral  SpO2: 97% 99% 100% 100%  Weight:   77.2 kg   Height:         Intake/Output Summary (Last 24 hours) at 04/01/2020 1100 Last data filed at 04/01/2020 1032 Gross per 24 hour  Intake 240 ml  Output 500 ml  Net -260 ml      PHYSICAL EXAM  General: Well developed, well nourished, in no acute distress HEENT:  Normocephalic and atramatic Neck:  No JVD.  Lungs: Clear bilaterally to auscultation and percussion. Heart: HRRR . Normal S1 and S2 without gallops or murmurs.  Abdomen: Bowel sounds are positive, abdomen soft and non-tender  Msk:  Back normal, normal gait. Normal strength and tone for age. Extremities: No clubbing, cyanosis or edema.   Neuro: Alert and oriented X 3. Psych:  Good affect, responds appropriately   LABS: Basic Metabolic Panel: Recent Labs    03/30/20 1045 03/31/20 0503  NA 137 134*  K 4.2 4.3  CL 101 96*  CO2 26 28  GLUCOSE 101* 250*  BUN 34* 33*  CREATININE 0.98 0.82  CALCIUM 8.8* 8.8*   Liver Function Tests: No results for input(s): AST, ALT, ALKPHOS, BILITOT, PROT, ALBUMIN in the last 72 hours. No results for input(s): LIPASE, AMYLASE in the last 72 hours. CBC: Recent Labs    03/30/20 1045 03/31/20 0503  WBC 20.7* 17.2*  HGB 10.5* 10.0*  HCT 34.0* 33.0*  MCV 84.2 84.8  PLT 351 302   Cardiac Enzymes: No results for input(s): CKTOTAL, CKMB, CKMBINDEX, TROPONINI in the last 72 hours. BNP: Invalid input(s): POCBNP D-Dimer: No results for input(s): DDIMER in the last 72 hours. Hemoglobin A1C: Recent Labs    03/30/20 1457  HGBA1C 7.4*    Fasting Lipid Panel: No results for input(s): CHOL, HDL, LDLCALC, TRIG, CHOLHDL, LDLDIRECT in the last 72 hours. Thyroid Function Tests: No results for input(s): TSH, T4TOTAL, T3FREE, THYROIDAB in the last 72 hours.  Invalid input(s): FREET3 Anemia Panel: No results for input(s): VITAMINB12, FOLATE, FERRITIN, TIBC, IRON, RETICCTPCT in the last 72 hours.  DG Chest 2 View  Result Date: 03/30/2020 CLINICAL DATA:  Shortness of breath for 3 weeks EXAM: CHEST - 2 VIEW COMPARISON:  03/22/2020 FINDINGS: Cardiac silhouette remains mildly enlarged. Atherosclerotic calcification of the aortic knob. Mildly hyperexpanded lungs. Chronically coarsened interstitial markings bilaterally. No focal airspace consolidation. No pleural effusion or pneumothorax. IMPRESSION: Stable exam.  No acute cardiopulmonary process. Electronically Signed   By: 03/24/2020 D.O.   On: 03/30/2020 12:55     Echo normal left ventricular function preserved  TELEMETRY: Normal sinus rhythm rate of 66:  ASSESSMENT AND PLAN:  Principal Problem:   COPD with acute exacerbation (HCC) Active Problems:   Essential (primary) hypertension   AF (paroxysmal atrial fibrillation) (HCC)   Chronic respiratory failure with hypoxia (HCC)   Elevated troponin level   Leukocytosis    Plan Acute bronchitis cough congestion improved Recommend continue pulmonary support inhalers probable steroids possible antibiotics Recommend cough medications probably Mucinex to help with congestion Atrial  fibrillation recommend long-term anticoagulation with Eliquis twice a day Borderline troponins do not represent a non-STEMI Increase activity ambulate in halls Recommend follow-up with pulmonary Recommend Mucinex for cough symptoms   Peggy Pea, MD 04/01/2020 11:00 AM

## 2020-04-01 NOTE — Discharge Instructions (Signed)
Information on my medicine - ELIQUIS® (apixaban) ° °This medication education was reviewed with me or my healthcare representative as part of my discharge preparation. ° °Why was Eliquis® prescribed for you? °Eliquis® was prescribed for you to reduce the risk of a blood clot forming that can cause a stroke if you have a medical condition called atrial fibrillation (a type of irregular heartbeat). ° °What do You need to know about Eliquis® ? °Take your Eliquis® TWICE DAILY - one tablet in the morning and one tablet in the evening with or without food. If you have difficulty swallowing the tablet whole please discuss with your pharmacist how to take the medication safely. ° °Take Eliquis® exactly as prescribed by your doctor and DO NOT stop taking Eliquis® without talking to the doctor who prescribed the medication.  Stopping may increase your risk of developing a stroke.  Refill your prescription before you run out. ° °After discharge, you should have regular check-up appointments with your healthcare provider that is prescribing your Eliquis®.  In the future your dose may need to be changed if your kidney function or weight changes by a significant amount or as you get older. ° °What do you do if you miss a dose? °If you miss a dose, take it as soon as you remember on the same day and resume taking twice daily.  Do not take more than one dose of ELIQUIS at the same time to make up a missed dose. ° °Important Safety Information °A possible side effect of Eliquis® is bleeding. You should call your healthcare provider right away if you experience any of the following: °? Bleeding from an injury or your nose that does not stop. °? Unusual colored urine (red or dark brown) or unusual colored stools (red or Bells). °? Unusual bruising for unknown reasons. °? A serious fall or if you hit your head (even if there is no bleeding). ° °Some medicines may interact with Eliquis® and might increase your risk of bleeding or  clotting while on Eliquis®. To help avoid this, consult your healthcare provider or pharmacist prior to using any new prescription or non-prescription medications, including herbals, vitamins, non-steroidal anti-inflammatory drugs (NSAIDs) and supplements. ° °This website has more information on Eliquis® (apixaban): http://www.eliquis.com/eliquis/home ° °

## 2020-04-01 NOTE — Progress Notes (Signed)
*  PRELIMINARY RESULTS* Echocardiogram 2D Echocardiogram has been performed.  Peggy [name] 04/01/2020, 2:40 PM

## 2020-04-02 LAB — GLUCOSE, CAPILLARY
Glucose-Capillary: 131 mg/dL — ABNORMAL HIGH (ref 70–99)
Glucose-Capillary: 311 mg/dL — ABNORMAL HIGH (ref 70–99)
Glucose-Capillary: 372 mg/dL — ABNORMAL HIGH (ref 70–99)
Glucose-Capillary: 224 mg/dL — ABNORMAL HIGH (ref 70–99)

## 2020-04-02 LAB — ECHOCARDIOGRAM COMPLETE
AR max vel: 1.12 cm2
AV Peak grad: 13.1 mmHg
Ao pk vel: 1.81 m/s
Area-P 1/2: 2.37 cm2
Height: 62 in
S' Lateral: 2.87 cm
Weight: 2724.8 oz

## 2020-04-02 LAB — BASIC METABOLIC PANEL
Anion gap: 10 (ref 5–15)
BUN: 34 mg/dL — ABNORMAL HIGH (ref 8–23)
CO2: 25 mmol/L (ref 22–32)
Calcium: 8.5 mg/dL — ABNORMAL LOW (ref 8.9–10.3)
Chloride: 96 mmol/L — ABNORMAL LOW (ref 98–111)
Creatinine, Ser: 1.33 mg/dL — ABNORMAL HIGH (ref 0.44–1.00)
GFR, Estimated: 38 mL/min — ABNORMAL LOW (ref >60)
Glucose, Bld: 288 mg/dL — ABNORMAL HIGH (ref 70–99)
Potassium: 4.3 mmol/L (ref 3.5–5.1)
Sodium: 131 mmol/L — ABNORMAL LOW (ref 135–145)

## 2020-04-02 LAB — MAGNESIUM: Magnesium: 2 mg/dL (ref 1.7–2.4)

## 2020-04-02 LAB — PHOSPHORUS: Phosphorus: 3 mg/dL (ref 2.5–4.6)

## 2020-04-02 MED ORDER — DILTIAZEM HCL ER COATED BEADS 120 MG PO CP24: 120.0000 mg | ORAL_CAPSULE | Freq: Every day | ORAL | Status: DC

## 2020-04-02 MED ORDER — PREDNISONE 20 MG PO TABS
20.0000 mg | ORAL_TABLET | Freq: Every day | ORAL | Status: DC
  Administered 2020-04-03: 20 mg via ORAL
  Filled 2020-04-02: qty 1

## 2020-04-02 MED ORDER — DILTIAZEM HCL 25 MG/5ML IV SOLN
INTRAVENOUS | Status: AC
  Administered 2020-04-02: 5 mg
  Filled 2020-04-02: qty 5

## 2020-04-02 MED ORDER — DIGOXIN 0.25 MG/ML IJ SOLN
0.2500 mg | Freq: Once | INTRAMUSCULAR | Status: AC
  Administered 2020-04-02: 0.25 mg via INTRAVENOUS
  Filled 2020-04-02: qty 2

## 2020-04-02 MED ORDER — DIGOXIN 0.25 MG/ML IJ SOLN
0.5000 mg | Freq: Once | INTRAMUSCULAR | Status: AC
  Administered 2020-04-02: 0.5 mg via INTRAVENOUS
  Filled 2020-04-02: qty 2

## 2020-04-02 MED ORDER — DILTIAZEM HCL ER COATED BEADS 120 MG PO CP24
120.0000 mg | ORAL_CAPSULE | Freq: Every day | ORAL | 0 refills | Status: DC
Start: 2020-04-02 — End: 2020-04-03

## 2020-04-02 MED ORDER — MELATONIN 5 MG PO TABS
5.0000 mg | ORAL_TABLET | Freq: Every evening | ORAL | 0 refills | Status: DC | PRN
Start: 2020-04-02 — End: 2020-04-12

## 2020-04-02 MED ORDER — DILTIAZEM HCL-DEXTROSE 125-5 MG/125ML-% IV SOLN (PREMIX)
5.0000 mg/h | INTRAVENOUS | Status: DC
  Administered 2020-04-02: 5 mg/h via INTRAVENOUS
  Filled 2020-04-02: qty 125

## 2020-04-02 MED ORDER — DILTIAZEM HCL 25 MG/5ML IV SOLN: 5.0000 mg | Freq: Once | INTRAVENOUS | Status: AC

## 2020-04-02 MED ORDER — APIXABAN 5 MG PO TABS
5.0000 mg | ORAL_TABLET | Freq: Two times a day (BID) | ORAL | 0 refills | Status: DC
Start: 2020-04-02 — End: 2020-04-12

## 2020-04-02 MED ORDER — DILTIAZEM HCL 25 MG/5ML IV SOLN
15.0000 mg | Freq: Once | INTRAVENOUS | Status: AC
  Administered 2020-04-02: 15 mg via INTRAVENOUS
  Filled 2020-04-02: qty 5

## 2020-04-02 NOTE — TOC Initial Note (Signed)
Transition of Care [facility]) - Initial/Assessment Note    Patient Details  Name: Peggy [name] MRN: [id number] Date of Birth: Mar 01, 1929  Transition [facility]) CM/SW Contact:    Chapman Fitch, RN Phone Number: 04/02/2020, 4:23 PM  Clinical Narrative:                 Patient admitted from home with COPD Patient states "Pt lives with adult daughter who works during the day but another family member comes to stay with pt every day through the week except Friday. Pt doesn't cook/clean but reports she can "do for" herself on Friday.:  PCP Dareen Piano -  States daughter provides transportation to appointments.  Denies issues obtaining medications  Patient has a RW and cane in the home for ambulation  PT has worked with patient and recommends outpatient PT.  Patient declines any PT services at discharge.  Patient agreeable to home health nursing for management of COPD.  States she does not have a preference of home health agency preference.  Referral made and accepted by Barbara Cower with [facility]   Patient has chronic home o2 through [facility]   Expected Discharge Plan: Home w Home Health Services Barriers to Discharge: Continued Medical Work up   Patient Goals and CMS Choice Patient states their goals for this hospitalization and ongoing recovery are:: to feel better and go home   Choice offered to / list presented to : Patient  Expected Discharge Plan and Services Expected Discharge Plan: Home w Home Health Services   Discharge Planning Services: CM Consult Post Acute Care Choice: Home Health Living arrangements for the past 2 months: Single Family Home Expected Discharge Date: 04/02/20                         HH Arranged: RN HH Agency: [facility] Date HH Agency Contacted: 04/02/20   Representative spoke with at [facility] Agency: Barbara Cower  Prior Living Arrangements/Services Living arrangements for the past 2 months: Single Family Home Lives with:: Adult  Children Patient language and need for interpreter reviewed:: Yes Do you feel safe going back to the place where you live?: Yes      Need for Family Participation in Patient Care: Yes (Comment) Care giver support system in place?: Yes (comment) Current home services: DME Criminal Activity/Legal Involvement Pertinent to Current Situation/Hospitalization: No - Comment as needed  Activities of Daily Living Home Assistive Devices/Equipment: Eyeglasses, Hearing aid, Oxygen ADL Screening (condition at time of admission) Patient's cognitive ability adequate to safely complete daily activities?: Yes Is the patient deaf or have difficulty hearing?: Yes Does the patient have difficulty seeing, even when wearing glasses/contacts?: No Does the patient have difficulty concentrating, remembering, or making decisions?: No Patient able to express need for assistance with ADLs?: Yes Does the patient have difficulty dressing or bathing?: No Independently performs ADLs?: Yes (appropriate for developmental age) Does the patient have difficulty walking or climbing stairs?: Yes Weakness of Legs: Both Weakness of Arms/Hands: None  Permission Sought/Granted                  Emotional Assessment Appearance:: Appears older than stated age     Orientation: : Oriented to Self, Oriented to Place, Oriented to  Time, Oriented to Situation Alcohol / Substance Use: Not Applicable Psych Involvement: No (comment)  Admission diagnosis:  COPD with acute exacerbation (HCC) [J44.1] Patient Active Problem List   Diagnosis Date Noted  . COPD with  acute exacerbation (HCC) 03/30/2020  . Elevated troponin level 03/30/2020  . Leukocytosis 03/30/2020  . COPD (chronic obstructive pulmonary disease) (HCC) 03/22/2020  . Uncontrolled hypertension 11/22/2018  . Chronic respiratory failure with hypoxia (HCC) 10/08/2017  . Simple chronic bronchitis (HCC) 10/08/2017  . SOB (shortness of breath) 09/24/2017  . History of  stroke 08/17/2017  . Acute respiratory failure with hypoxia and hypercarbia (HCC) 08/07/2017  . Respiratory failure (HCC) 08/07/2017  . History of GI bleed 07/07/2017  . Atrial fibrillation with RVR (HCC) 06/30/2017  . Bilateral carotid artery disease (HCC) 04/14/2017  . Healthcare maintenance 04/14/2017  . Dizziness 04/07/2017  . 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 TIA (transient ischemic attack) 12/10/2016  . CVA (cerebral vascular accident) (HCC) 12/09/2016  . Bilateral arm pain 11/27/2016  . Myocardial infarction (HCC) 07/30/2015  . Urinary frequency 02/09/2015  . RUQ abdominal pain 01/26/2015  . Shortness of breath 01/26/2015  . Anxiety 01/26/2015  . Chronic pruritus 12/21/2014  . Cerebral vascular accident (HCC) 11/16/2014  . Chronic low back pain 11/16/2014  . Adult hypothyroidism 11/15/2014  . Allergic rhinitis 11/15/2014  . Body mass index (BMI) of 28.0-28.9 in adult 11/15/2014  . Arthritis 11/15/2014  . Cramps of lower extremity 11/15/2014  . Essential (primary) hypertension 11/15/2014  . Acid reflux 11/15/2014  . Hypercholesteremia 11/15/2014  . Cannot sleep 11/15/2014  . Psoriasis 11/15/2014  . Itch of skin 11/15/2014  . Restless leg 11/15/2014  . Diabetes mellitus, type 2 (HCC) 11/15/2014  . Breath shortness 11/15/2014  . Dermatitis due to unknown cause 04/10/2014  . Arteriosclerosis of coronary artery 02/20/2014  . AF (paroxysmal atrial fibrillation) (HCC) 02/20/2014  . Temporary cerebral vascular dysfunction 02/20/2014  . DD (diverticular disease) 10/05/2013   PCP:  Lauro Regulus, MD Pharmacy:   [facility] [phone] - [name], [place] - [address] AT [facility] OF [address] [address] [place] [address] Phone: [phone] Fax: [phone]  [id number] - [place], [place] - [address] [place] [address] Phone: [phone] Fax: [phone]  [facility]  #[id number] - [place], [place] - [address] AT [facility] [address] [facility] [address] [place] [address] Phone: [phone] Fax: [phone]     Social Determinants of Health (SDOH) Interventions    Readmission Risk Interventions Readmission Risk Prevention Plan 11/24/2018  PCP or Specialist Appt within 5-7 Days Complete  Home Care Screening Complete  Medication Review (RN CM) Complete  Some recent data might be hidden

## 2020-04-02 NOTE — Telephone Encounter (Signed)
Pt's daughter advised to go to hospital

## 2020-04-02 NOTE — Care Management Important Message (Signed)
Important Message  Patient Details  Name: Peggy [name] MRN: [id number] Date of Birth: August 29, 1928   Medicare Important Message Given:  Yes     Johnell Comings 04/02/2020, 12:14 PM

## 2020-04-02 NOTE — Progress Notes (Signed)
Converted to NSR and sustaining with a rate of 61.

## 2020-04-02 NOTE — Progress Notes (Signed)
PT Cancellation Note  Patient Details Name: Peggy [name] MRN: [id number] DOB: [birth year]   Cancelled Treatment:    Reason Eval/Treat Not Completed: Medical issues which prohibited therapy   Telemetry monitor shows HR 140's  - 150's at rest.  Will hold session at this time and continue as appropriate.   Danielle Dess 04/02/2020, 2:58 PM

## 2020-04-02 NOTE — Progress Notes (Signed)
Progress Note    DENELLE CAPURRO  TKW:[id number] DOB: [birth year]  DOA: 03/30/2020 PCP: Lauro Regulus, MD      Brief Narrative:    Medical records reviewed and are as summarized below:  BERDA SHELVIN is a 86 y.o. female       Assessment/Plan:   Principal Problem:   COPD with acute exacerbation (HCC) Active Problems:   Essential (primary) hypertension   AF (paroxysmal atrial fibrillation) (HCC)   Chronic respiratory failure with hypoxia (HCC)   Elevated troponin level   Leukocytosis   Body mass index is 31.97 kg/m. (Obesity)   COPD exacerbation, severe cough, probable acute on chronic bronchitis: Continue prednisone but decrease dose to 20 mg daily since patient is hyperglycemic.  Continue bronchodilators.  Chart review showed that patient has been receiving hydrocodone cough syrup since February 2020.  She was prescribed hydrocodone chlorpheniramine cough syrup in January, August and October 2021.  Outpatient follow-up with pulmonologist, Dr. Welton Flakes, as recommended.   Paroxysmal atrial fibrillation with rapid ventricular response: She was given IV Cardizem 15 mg bolus x1 dose without improvement in her heart rate.  IV Cardizem infusion has been ordered for rate control.  Continue metoprolol and Eliquis.  Chronic hypoxemic respiratory failure: She is back on 4 L/min oxygen via nasal cannula.  Elevated troponin: Likely due to demand ischemia.  2D echo is still pending.  Continue Eliquis, metoprolol and nitrates.  Type II DM with hyperglycemia: Continue insulin 70/30 and Humalog as needed for hyperglycemia.   Leukocytosis: This is likely due to recent use of prednisone.  History of TIA and stroke: Continue Eliquis  Other comorbidities include hypertension, GERD, hyperlipidemia. Continue home meds.      Diet Order            Diet - low sodium heart healthy           Diet Carb Modified           Diet Carb Modified Fluid consistency: Thin; Room service  appropriate? Yes  Diet effective now                    Consultants:  Cardiologist  Procedures:  None    Medications:   . acidophilus  2 capsule Oral Daily  . apixaban  5 mg Oral BID  . vitamin C with rose hips  1,000 mg Oral Daily  . budesonide (PULMICORT) nebulizer solution  0.25 mg Nebulization BID  . cholecalciferol  5,000 Units Oral Daily  . darifenacin  7.5 mg Oral Daily  . gabapentin  100 mg Oral Q2000  . guaiFENesin-dextromethorphan  10 mL Oral TID  . insulin aspart  0-20 Units Subcutaneous TID WC  . insulin aspart protamine- aspart  20 Units Subcutaneous BID WC  . ipratropium-albuterol  3 mL Nebulization Q6H  . isosorbide mononitrate  30 mg Oral Daily  . levothyroxine  50 mcg Oral Daily  . losartan  25 mg Oral Daily  . metoprolol succinate  100 mg Oral Daily  . pantoprazole  40 mg Oral Daily  . polyethylene glycol  17 g Oral Daily  . predniSONE  40 mg Oral Q breakfast  . psyllium  1 packet Oral Daily  . sodium chloride flush  3 mL Intravenous Q12H  . vitamin B-12  1,000 mcg Oral Daily   Continuous Infusions: . sodium chloride    . diltiazem (CARDIZEM) infusion       Anti-infectives (From admission, onward)   None  Family Communication/Anticipated D/C date and plan/Code Status   DVT prophylaxis:  apixaban (ELIQUIS) tablet 5 mg     Code Status: Full Code  Family Communication: Plan discussed with her daughter, Marcelino Duster, at the bedside Disposition Plan:    Status is: Inpatient  Remains inpatient appropriate because:Inpatient level of care appropriate due to severity of illness   Dispo: The patient is from: Home              Anticipated d/c is to: Home              Anticipated d/c date is: 1 day              Patient currently is not medically stable to d/c.           Subjective:   She still has intermittent severe coughing spells.  She slept well last night.  No chest pain, shortness of breath, palpitations  or dizziness.  Objective:    Vitals:   04/02/20 0300 04/02/20 0438 04/02/20 0756 04/02/20 1123  BP:  140/72 (!) 139/51 (!) 152/85  Pulse:  63 (!) 59 (!) 115  Resp: 20 17 18 18   Temp:  97.7 F (36.5 C) 97.7 F (36.5 C) 97.6 F (36.4 C)  TempSrc:      SpO2:  99% 100% 96%  Weight:  79.3 kg    Height:       No data found.   Intake/Output Summary (Last 24 hours) at 04/02/2020 1458 Last data filed at 04/02/2020 1300 Gross per 24 hour  Intake 480 ml  Output --  Net 480 ml   Filed Weights   03/30/20 2300 04/01/20 0458 04/02/20 0438  Weight: 77.8 kg 77.2 kg 79.3 kg    Exam:  GEN: NAD SKIN: No rash EYES: EOMI ENT: MMM CV: Irregular rate and rhythm, tachycardic PULM: CTA B ABD: soft, ND, NT, +BS CNS: AAO x 3, non focal EXT: No edema or tenderness   Cardiac monitor showed heart rate sustained in the 150s    Data Reviewed:   I have personally reviewed following labs and imaging studies:  Labs: Labs show the following:   Basic Metabolic Panel: Recent Labs  Lab 03/30/20 1045 03/31/20 0503  NA 137 134*  K 4.2 4.3  CL 101 96*  CO2 26 28  GLUCOSE 101* 250*  BUN 34* 33*  CREATININE 0.98 0.82  CALCIUM 8.8* 8.8*   GFR Estimated Creatinine Clearance: 45.4 mL/min (by C-G formula based on SCr of 0.82 mg/dL). Liver Function Tests: No results for input(s): AST, ALT, ALKPHOS, BILITOT, PROT, ALBUMIN in the last 168 hours. No results for input(s): LIPASE, AMYLASE in the last 168 hours. No results for input(s): AMMONIA in the last 168 hours. Coagulation profile No results for input(s): INR, PROTIME in the last 168 hours.  CBC: Recent Labs  Lab 03/30/20 1045 03/31/20 0503  WBC 20.7* 17.2*  HGB 10.5* 10.0*  HCT 34.0* 33.0*  MCV 84.2 84.8  PLT 351 302   Cardiac Enzymes: No results for input(s): CKTOTAL, CKMB, CKMBINDEX, TROPONINI in the last 168 hours. BNP (last 3 results) No results for input(s): PROBNP in the last 8760 hours. CBG: Recent Labs  Lab  04/01/20 0754 04/01/20 1200 04/01/20 1631 04/02/20 0753 04/02/20 1121  GLUCAP 170* 205* 189* 131* 311*   D-Dimer: No results for input(s): DDIMER in the last 72 hours. Hgb A1c: No results for input(s): HGBA1C in the last 72 hours. Lipid Profile: No results for input(s): CHOL, HDL, LDLCALC,  TRIG, CHOLHDL, LDLDIRECT in the last 72 hours. Thyroid function studies: No results for input(s): TSH, T4TOTAL, T3FREE, THYROIDAB in the last 72 hours.  Invalid input(s): FREET3 Anemia work up: No results for input(s): VITAMINB12, FOLATE, FERRITIN, TIBC, IRON, RETICCTPCT in the last 72 hours. Sepsis Labs: Recent Labs  Lab 03/30/20 1045 03/31/20 0503  WBC 20.7* 17.2*    Microbiology Recent Results (from the past 240 hour(s))  Respiratory Panel by RT PCR (Flu A&B, Covid) - Nasopharyngeal Swab     Status: None   Collection Time: 03/30/20  2:47 PM   Specimen: Nasopharyngeal Swab  Result Value Ref Range Status   SARS Coronavirus 2 by RT PCR NEGATIVE NEGATIVE Final    Comment: (NOTE) SARS-CoV-2 target nucleic acids are NOT DETECTED.  The SARS-CoV-2 RNA is generally detectable in upper respiratoy specimens during the acute phase of infection. The lowest concentration of SARS-CoV-2 viral copies this assay can detect is 131 copies/mL. A negative result does not preclude SARS-Cov-2 infection and should not be used as the sole basis for treatment or other patient management decisions. A negative result may occur with  improper specimen collection/handling, submission of specimen other than nasopharyngeal swab, presence of viral mutation(s) within the areas targeted by this assay, and inadequate number of viral copies (<131 copies/mL). A negative result must be combined with clinical observations, patient history, and epidemiological information. The expected result is Negative.  Fact Sheet for Patients:  https://www.moore.com/  Fact Sheet for Healthcare Providers:    https://www.young.biz/  This test is no t yet approved or cleared by the [place] FDA and  has been authorized for detection and/or diagnosis of SARS-CoV-2 by FDA under an Emergency Use Authorization (EUA). This EUA will remain  in effect (meaning this test can be used) for the duration of the COVID-19 declaration under Section 564(b)(1) of the Act, 21 U.S.C. section 360bbb-3(b)(1), unless the authorization is terminated or revoked sooner.     Influenza A by PCR NEGATIVE NEGATIVE Final   Influenza B by PCR NEGATIVE NEGATIVE Final    Comment: (NOTE) The Xpert Xpress SARS-CoV-2/FLU/RSV assay is intended as an aid in  the diagnosis of influenza from Nasopharyngeal swab specimens and  should not be used as a sole basis for treatment. Nasal washings and  aspirates are unacceptable for Xpert Xpress SARS-CoV-2/FLU/RSV  testing.  Fact Sheet for Patients: https://www.moore.com/  Fact Sheet for Healthcare Providers: https://www.young.biz/  This test is not yet approved or cleared by the [place] FDA and  has been authorized for detection and/or diagnosis of SARS-CoV-2 by  FDA under an Emergency Use Authorization (EUA). This EUA will remain  in effect (meaning this test can be used) for the duration of the  Covid-19 declaration under Section 564(b)(1) of the Act, 21  U.S.C. section 360bbb-3(b)(1), unless the authorization is  terminated or revoked. Performed at [facility], [address] [place], [place] [address]   Resp Panel by RT PCR (RSV, Flu A&B, Covid) - Nasopharyngeal Swab     Status: None   Collection Time: 03/30/20  2:47 PM   Specimen: Nasopharyngeal Swab  Result Value Ref Range Status   SARS Coronavirus 2 by RT PCR NEGATIVE NEGATIVE Final    Comment: (NOTE) SARS-CoV-2 target nucleic acids are NOT DETECTED.  The SARS-CoV-2 RNA is generally detectable in upper respiratoy specimens during the  acute phase of infection. The lowest concentration of SARS-CoV-2 viral copies this assay can detect is 131 copies/mL. A negative result does not preclude SARS-Cov-2  infection and should not be used as the sole basis for treatment or other patient management decisions. A negative result may occur with  improper specimen collection/handling, submission of specimen other than nasopharyngeal swab, presence of viral mutation(s) within the areas targeted by this assay, and inadequate number of viral copies (<131 copies/mL). A negative result must be combined with clinical observations, patient history, and epidemiological information. The expected result is Negative.  Fact Sheet for Patients:  https://www.moore.com/  Fact Sheet for Healthcare Providers:  https://www.young.biz/  This test is no t yet approved or cleared by the [place] FDA and  has been authorized for detection and/or diagnosis of SARS-CoV-2 by FDA under an Emergency Use Authorization (EUA). This EUA will remain  in effect (meaning this test can be used) for the duration of the COVID-19 declaration under Section 564(b)(1) of the Act, 21 U.S.C. section 360bbb-3(b)(1), unless the authorization is terminated or revoked sooner.     Influenza A by PCR NEGATIVE NEGATIVE Final   Influenza B by PCR NEGATIVE NEGATIVE Final    Comment: (NOTE) The Xpert Xpress SARS-CoV-2/FLU/RSV assay is intended as an aid in  the diagnosis of influenza from Nasopharyngeal swab specimens and  should not be used as a sole basis for treatment. Nasal washings and  aspirates are unacceptable for Xpert Xpress SARS-CoV-2/FLU/RSV  testing.  Fact Sheet for Patients: https://www.moore.com/  Fact Sheet for Healthcare Providers: https://www.young.biz/  This test is not yet approved or cleared by the [place] FDA and  has been authorized for detection and/or diagnosis  of SARS-CoV-2 by  FDA under an Emergency Use Authorization (EUA). This EUA will remain  in effect (meaning this test can be used) for the duration of the  Covid-19 declaration under Section 564(b)(1) of the Act, 21  U.S.C. section 360bbb-3(b)(1), unless the authorization is  terminated or revoked.    Respiratory Syncytial Virus by PCR NEGATIVE NEGATIVE Final    Comment: (NOTE) Fact Sheet for Patients: https://www.moore.com/  Fact Sheet for Healthcare Providers: https://www.young.biz/  This test is not yet approved or cleared by the [place] FDA and  has been authorized for detection and/or diagnosis of SARS-CoV-2 by  FDA under an Emergency Use Authorization (EUA). This EUA will remain  in effect (meaning this test can be used) for the duration of the  COVID-19 declaration under Section 564(b)(1) of the Act, 21 U.S.C.  section 360bbb-3(b)(1), unless the authorization is terminated or  revoked. Performed at [facility], [address] [place], [place] [address]     Procedures and diagnostic studies:  No results found.             LOS: 3 days   Shyloh Krinke  [facility]   Pager on [email or web address]. If 7PM-7AM, please contact night-coverage at [email or web address]     04/02/2020, 2:58 PM

## 2020-04-03 DIAGNOSIS — D72825 Bandemia: Secondary | ICD-10-CM

## 2020-04-03 LAB — GLUCOSE, CAPILLARY
Glucose-Capillary: 210 mg/dL — ABNORMAL HIGH (ref 70–99)
Glucose-Capillary: 130 mg/dL — ABNORMAL HIGH (ref 70–99)
Glucose-Capillary: 273 mg/dL — ABNORMAL HIGH (ref 70–99)

## 2020-04-03 LAB — CBC
HCT: 33.2 % — ABNORMAL LOW (ref 36.0–46.0)
Hemoglobin: 10.2 g/dL — ABNORMAL LOW (ref 12.0–15.0)
MCH: 26.4 pg (ref 26.0–34.0)
MCHC: 30.7 g/dL (ref 30.0–36.0)
MCV: 85.8 fL (ref 80.0–100.0)
Platelets: 305 10*3/uL (ref 150–400)
RBC: 3.87 MIL/uL (ref 3.87–5.11)
RDW: 15.8 % — ABNORMAL HIGH (ref 11.5–15.5)
WBC: 17.4 10*3/uL — ABNORMAL HIGH (ref 4.0–10.5)
nRBC: 0 % (ref 0.0–0.2)

## 2020-04-03 LAB — BASIC METABOLIC PANEL
Anion gap: 9 (ref 5–15)
BUN: 27 mg/dL — ABNORMAL HIGH (ref 8–23)
CO2: 28 mmol/L (ref 22–32)
Calcium: 8.8 mg/dL — ABNORMAL LOW (ref 8.9–10.3)
Chloride: 95 mmol/L — ABNORMAL LOW (ref 98–111)
Creatinine, Ser: 1.04 mg/dL — ABNORMAL HIGH (ref 0.44–1.00)
GFR, Estimated: 51 mL/min — ABNORMAL LOW (ref >60)
Glucose, Bld: 132 mg/dL — ABNORMAL HIGH (ref 70–99)
Potassium: 4.5 mmol/L (ref 3.5–5.1)
Sodium: 132 mmol/L — ABNORMAL LOW (ref 135–145)

## 2020-04-03 MED ORDER — LACTATED RINGERS IV BOLUS
500.0000 mL | Freq: Once | INTRAVENOUS | Status: AC
  Administered 2020-04-03: 500 mL via INTRAVENOUS

## 2020-04-03 NOTE — Progress Notes (Signed)
Physical Therapy Treatment Patient Details Name: Peggy [name] MRN: [id number] DOB: [birth year] Today's Date: 04/03/2020    History of Present Illness Pt is an 86 y/o F admitted on 03/30/20 for worsening SOB & intermittent chest tightness despite trying 2 antibiotics perscribed by outpatient pulmonologist. PMH: COPD on 4L/min at home (24/7), gastric reflux, HTN, HLD, a-fib, arthritis, DM, stroke, TIA    PT Comments    Pt in room upon entry agreeable to participate in PT session. Supervision for bed mobility due to lines/leads, minguardA for xfers and AMB without device. Pt maintained on 4L/min O2 as per baseline. Pt progresses AMB to >378ft, then is fatigued. A few instances of sway, but no full LOB, no assist needed for righting. Progress continues to be made, but OPPT remains most appropriate option at DC.     Follow Up Recommendations  Outpatient PT;Supervision for mobility/OOB     Equipment Recommendations  None recommended by PT    Recommendations for Other Services       Precautions / Restrictions Precautions Precautions: Fall Restrictions Weight Bearing Restrictions: No    Mobility  Bed Mobility Overal bed mobility: Modified Independent                Transfers Overall transfer level: Modified independent Equipment used: 1 person hand held assist Transfers: Sit to/from Stand Sit to Stand: Min guard Stand pivot transfers: Min guard          Ambulation/Gait Ambulation/Gait assistance: Supervision Gait Distance (Feet): 360 Feet Assistive device: None Gait Pattern/deviations: WFL(Within Functional Limits) Gait velocity: 0.104m/s   General Gait Details: mild sway in straight plane, but no assist needed for righting   Stairs             Wheelchair Mobility    Modified Rankin (Stroke Patients Only)       Balance                                            Cognition Arousal/Alertness: Awake/alert Behavior During Therapy:  WFL for tasks assessed/performed Overall Cognitive Status: Within Functional Limits for tasks assessed                                        Exercises      General Comments        Pertinent Vitals/Pain Pain Assessment: No/denies pain    Home Living                      Prior Function            PT Goals (current goals can now be found in the care plan section) Acute Rehab PT Goals Patient Stated Goal: to go home PT Goal Formulation: With patient Time For Goal Achievement: 04/13/20 Potential to Achieve Goals: Good Progress towards PT goals: Progressing toward goals    Frequency    Min 2X/week      PT Plan Current plan remains appropriate    Co-evaluation              AM-PAC PT "6 Clicks" Mobility   Outcome Measure  Help needed turning from your back to your side while in a flat bed without using bedrails?: None Help needed moving from lying on your back to sitting on  the side of a flat bed without using bedrails?: None Help needed moving to and from a bed to a chair (including a wheelchair)?: A Little Help needed standing up from a chair using your arms (e.g., wheelchair or bedside chair)?: A Little Help needed to walk in hospital room?: A Little Help needed climbing 3-5 steps with a railing? : A Little 6 Click Score: 20    End of Session Equipment Utilized During Treatment: Gait belt;Oxygen Activity Tolerance: Patient tolerated treatment well;Patient limited by fatigue Patient left: in bed;with nursing/sitter in room Nurse Communication: Mobility status PT Visit Diagnosis: Unsteadiness on feet (R26.81);Other abnormalities of gait and mobility (R26.89)     Time: 1540-1555 PT Time Calculation (min) (ACUTE ONLY): 15 min  Charges:  $Therapeutic Exercise: 8-22 mins                     4:19 PM, 04/03/20 Rosamaria Lints, PT, DPT Physical Therapist - [facility]  [phone] (ASCOM)     Ricke Kimoto C 04/03/2020, 4:17 PM

## 2020-04-03 NOTE — Discharge Summary (Signed)
Physician Discharge Summary  Peggy [name] [facility] ZOX:[id number] DOB: July 15, 1928 DOA: 03/30/2020  PCP: Lauro Regulus, MD  Admit date: 03/30/2020 Discharge date: 04/03/2020  Discharge disposition: Home with home health   Recommendations for Outpatient Follow-Up:   Follow-up with PCP in 1 week Follow-up with cardiologist, Dr. Juliann Pares, in 1 to 2 weeks Follow-up with pulmonologist, Dr. Welton Flakes, in 1 week   Discharge Diagnosis:   Principal Problem:   COPD with acute exacerbation (HCC) Active Problems:   Essential (primary) hypertension   AF (paroxysmal atrial fibrillation) (HCC)   Chronic respiratory failure with hypoxia (HCC)   Elevated troponin level   Leukocytosis    Discharge Condition: Stable.  Diet recommendation:  Diet Order            Diet - low sodium heart healthy           Diet Carb Modified           Diet Carb Modified Fluid consistency: Thin; Room service appropriate? Yes  Diet effective now                   Code Status: Full Code     Hospital Course:   Peggy [name] is an 86 year old woman with medical history significant for COPD, chronic cough, chronic hypoxemic respiratory failure on 4 L/min oxygen via nasal cannula at home, type II DM, CAD, hypertension, dyslipidemia, paroxysmal atrial fibrillation.  She presented to the hospital because of increasing shortness of breath and cough.  She said she had recently completed 2 courses of antibiotics and prednisone without any improvement in her symptoms.  Pulmonologist recommended that she come to the emergency room for further evaluation.  She was admitted to the hospital for acute COPD exacerbation.  She was treated with steroids and bronchodilators.  She was given antitussives including hydrocodone-containing cough syrup for severe coughing spells.  Her troponin was elevated but this was likely due to demand ischemia.  She developed atrial fibrillation with rapid ventricular response requiring IV digoxin  and IV Cardizem bolus followed by IV Cardizem infusion.  She converted to normal sinus rhythm.  Of note, she was evaluated by the cardiologist on this admission.  She will develop follow-up with mild AKI requiring IV fluids.  She has persistent leukocytosis which is likely due to recent use of steroids.   Medical Consultants:    Cardiologist   Discharge Exam:    Vitals:   04/03/20 0759 04/03/20 0800 04/03/20 1200 04/03/20 1201  BP:  (!) 178/61 (!) 160/59   Pulse: 72 68 60   Resp: 18  20   Temp:    98.2 F (36.8 C)  TempSrc:    Oral  SpO2: 98% 99% 98%   Weight:      Height:         GEN: NAD SKIN: No rash EYES: EOMI ENT: MMM CV: RRR PULM: CTA B ABD: soft, ND, NT, +BS CNS: AAO x 3, non focal EXT: No edema or tenderness   The results of significant diagnostics from this hospitalization (including imaging, microbiology, ancillary and laboratory) are listed below for reference.     Procedures and Diagnostic Studies:   ECHOCARDIOGRAM COMPLETE  Result Date: 04/02/2020    ECHOCARDIOGRAM REPORT   Patient Name:   Peggy [name] Date of Exam: 04/01/2020 Medical Rec #:  [id number]   Height:       62.0 in Accession #:    [id number]  Weight:       170.3 lb  Date of Birth:  1928-07-03   BSA:          1.785 m Patient Age:    89 years    BP:           139/77 mmHg Patient Gender: F           HR:           61 bpm. Exam Location:  [facility] Procedure: 2D Echo, Cardiac Doppler and Color Doppler Indications:     CHF-Acute Diastolic 428.31 / I50.31  History:         Patient has prior history of Echocardiogram examinations. TIA;                  Risk Factors:Hypertension.  Sonographer:     Neysa Bonito Roar Referring Phys:  [id number] AGBATA Diagnosing Phys: Alwyn Pea MD IMPRESSIONS  1. Left ventricular ejection fraction, by estimation, is 55 to 60%. The left ventricle has normal function. The left ventricle has no regional wall motion abnormalities. Left ventricular diastolic parameters were  normal.  2. Right ventricular systolic function is normal. The right ventricular size is normal.  3. The mitral valve is normal in structure. No evidence of mitral valve regurgitation.  4. The aortic valve is normal in structure. Aortic valve regurgitation is not visualized. FINDINGS  Left Ventricle: Left ventricular ejection fraction, by estimation, is 55 to 60%. The left ventricle has normal function. The left ventricle has no regional wall motion abnormalities. The left ventricular internal cavity size was normal in size. There is  no left ventricular hypertrophy. Left ventricular diastolic parameters were normal. Right Ventricle: The right ventricular size is normal. No increase in right ventricular wall thickness. Right ventricular systolic function is normal. Left Atrium: Left atrial size was normal in size. Right Atrium: Right atrial size was normal in size. Pericardium: There is no evidence of pericardial effusion. Mitral Valve: The mitral valve is normal in structure. No evidence of mitral valve regurgitation. Tricuspid Valve: The tricuspid valve is normal in structure. Tricuspid valve regurgitation is not demonstrated. Aortic Valve: The aortic valve is normal in structure. Aortic valve regurgitation is not visualized. Aortic valve peak gradient measures 13.1 mmHg. Pulmonic Valve: The pulmonic valve was normal in structure. Pulmonic valve regurgitation is not visualized. Aorta: The ascending aorta was not well visualized. IAS/Shunts: No atrial level shunt detected by color flow Doppler.  LEFT VENTRICLE PLAX 2D LVIDd:         3.96 cm  Diastology LVIDs:         2.87 cm  LV e' medial:    4.79 cm/s LV PW:         1.16 cm  LV E/e' medial:  26.1 LV IVS:        1.27 cm  LV e' lateral:   6.31 cm/s LVOT diam:     1.70 cm  LV E/e' lateral: 19.8 LVOT Area:     2.27 cm  RIGHT VENTRICLE RV Mid diam:    3.06 cm RV S prime:     13.10 cm/s TAPSE (M-mode): 2.5 cm LEFT ATRIUM             Index       RIGHT ATRIUM            Index LA diam:        4.30 cm 2.41 cm/m  RA Area:     14.10 cm LA Vol (A2C):   78.3 ml 43.85 ml/m RA Volume:  29.00 ml  16.24 ml/m LA Vol (A4C):   45.5 ml 25.48 ml/m LA Biplane Vol: 64.2 ml 35.96 ml/m  AORTIC VALVE                PULMONIC VALVE AV Area (Vmax): 1.12 cm    PV Vmax:        1.13 m/s AV Vmax:        181.00 cm/s PV Peak grad:   5.1 mmHg AV Peak Grad:   13.1 mmHg   RVOT Peak grad: 1 mmHg LVOT Vmax:      89.40 cm/s  AORTA Ao Root diam: 2.80 cm MITRAL VALVE MV Area (PHT): 2.37 cm     SHUNTS MV Decel Time: 320 msec     Systemic Diam: 1.70 cm MV E velocity: 125.00 cm/s MV A velocity: 90.00 cm/s MV E/A ratio:  1.39 MV A Prime:    7.1 cm/s Alwyn Pea MD Electronically signed by Alwyn Pea MD Signature Date/Time: 04/02/2020/4:27:03 PM    Final      Labs:   Basic Metabolic Panel: Recent Labs  Lab 03/30/20 1045 03/30/20 1045 03/31/20 0503 03/31/20 0503 04/02/20 1519 04/03/20 1309  NA 137  --  134*  --  131* 132*  K 4.2   < > 4.3   < > 4.3 4.5  CL 101  --  96*  --  96* 95*  CO2 26  --  28  --  25 28  GLUCOSE 101*  --  250*  --  288* 132*  BUN 34*  --  33*  --  34* 27*  CREATININE 0.98  --  0.82  --  1.33* 1.04*  CALCIUM 8.8*  --  8.8*  --  8.5* 8.8*  MG  --   --   --   --  2.0  --   PHOS  --   --   --   --  3.0  --    < > = values in this interval not displayed.   GFR Estimated Creatinine Clearance: 35.8 mL/min (A) (by C-G formula based on SCr of 1.04 mg/dL (H)). Liver Function Tests: No results for input(s): AST, ALT, ALKPHOS, BILITOT, PROT, ALBUMIN in the last 168 hours. No results for input(s): LIPASE, AMYLASE in the last 168 hours. No results for input(s): AMMONIA in the last 168 hours. Coagulation profile No results for input(s): INR, PROTIME in the last 168 hours.  CBC: Recent Labs  Lab 03/30/20 1045 03/31/20 0503 04/03/20 0354  WBC 20.7* 17.2* 17.4*  HGB 10.5* 10.0* 10.2*  HCT 34.0* 33.0* 33.2*  MCV 84.2 84.8 85.8  PLT 351 302 305   Cardiac  Enzymes: No results for input(s): CKTOTAL, CKMB, CKMBINDEX, TROPONINI in the last 168 hours. BNP: Invalid input(s): POCBNP CBG: Recent Labs  Lab 04/02/20 1121 04/02/20 1630 04/02/20 2015 04/03/20 0737 04/03/20 1200  GLUCAP 311* 372* 224* 210* 130*   D-Dimer No results for input(s): DDIMER in the last 72 hours. Hgb A1c No results for input(s): HGBA1C in the last 72 hours. Lipid Profile No results for input(s): CHOL, HDL, LDLCALC, TRIG, CHOLHDL, LDLDIRECT in the last 72 hours. Thyroid function studies No results for input(s): TSH, T4TOTAL, T3FREE, THYROIDAB in the last 72 hours.  Invalid input(s): FREET3 Anemia work up No results for input(s): VITAMINB12, FOLATE, FERRITIN, TIBC, IRON, RETICCTPCT in the last 72 hours. Microbiology Recent Results (from the past 240 hour(s))  Respiratory Panel by RT PCR (Flu A&B, Covid) - Nasopharyngeal Swab  Status: None   Collection Time: 03/30/20  2:47 PM   Specimen: Nasopharyngeal Swab  Result Value Ref Range Status   SARS Coronavirus 2 by RT PCR NEGATIVE NEGATIVE Final    Comment: (NOTE) SARS-CoV-2 target nucleic acids are NOT DETECTED.  The SARS-CoV-2 RNA is generally detectable in upper respiratoy specimens during the acute phase of infection. The lowest concentration of SARS-CoV-2 viral copies this assay can detect is 131 copies/mL. A negative result does not preclude SARS-Cov-2 infection and should not be used as the sole basis for treatment or other patient management decisions. A negative result may occur with  improper specimen collection/handling, submission of specimen other than nasopharyngeal swab, presence of viral mutation(s) within the areas targeted by this assay, and inadequate number of viral copies (<131 copies/mL). A negative result must be combined with clinical observations, patient history, and epidemiological information. The expected result is Negative.  Fact Sheet for Patients:    https://www.moore.com/  Fact Sheet for Healthcare Providers:  https://www.young.biz/  This test is no t yet approved or cleared by the [place] FDA and  has been authorized for detection and/or diagnosis of SARS-CoV-2 by FDA under an Emergency Use Authorization (EUA). This EUA will remain  in effect (meaning this test can be used) for the duration of the COVID-19 declaration under Section 564(b)(1) of the Act, 21 U.S.C. section 360bbb-3(b)(1), unless the authorization is terminated or revoked sooner.     Influenza A by PCR NEGATIVE NEGATIVE Final   Influenza B by PCR NEGATIVE NEGATIVE Final    Comment: (NOTE) The Xpert Xpress SARS-CoV-2/FLU/RSV assay is intended as an aid in  the diagnosis of influenza from Nasopharyngeal swab specimens and  should not be used as a sole basis for treatment. Nasal washings and  aspirates are unacceptable for Xpert Xpress SARS-CoV-2/FLU/RSV  testing.  Fact Sheet for Patients: https://www.moore.com/  Fact Sheet for Healthcare Providers: https://www.young.biz/  This test is not yet approved or cleared by the [place] FDA and  has been authorized for detection and/or diagnosis of SARS-CoV-2 by  FDA under an Emergency Use Authorization (EUA). This EUA will remain  in effect (meaning this test can be used) for the duration of the  Covid-19 declaration under Section 564(b)(1) of the Act, 21  U.S.C. section 360bbb-3(b)(1), unless the authorization is  terminated or revoked. Performed at [facility], [address] [place], [place] [address]   Resp Panel by RT PCR (RSV, Flu A&B, Covid) - Nasopharyngeal Swab     Status: None   Collection Time: 03/30/20  2:47 PM   Specimen: Nasopharyngeal Swab  Result Value Ref Range Status   SARS Coronavirus 2 by RT PCR NEGATIVE NEGATIVE Final    Comment: (NOTE) SARS-CoV-2 target nucleic acids are NOT DETECTED.  The  SARS-CoV-2 RNA is generally detectable in upper respiratoy specimens during the acute phase of infection. The lowest concentration of SARS-CoV-2 viral copies this assay can detect is 131 copies/mL. A negative result does not preclude SARS-Cov-2 infection and should not be used as the sole basis for treatment or other patient management decisions. A negative result may occur with  improper specimen collection/handling, submission of specimen other than nasopharyngeal swab, presence of viral mutation(s) within the areas targeted by this assay, and inadequate number of viral copies (<131 copies/mL). A negative result must be combined with clinical observations, patient history, and epidemiological information. The expected result is Negative.  Fact Sheet for Patients:  https://www.moore.com/  Fact Sheet for Healthcare Providers:  https://www.young.biz/  This test is no t yet approved or cleared by the [place] and  has been authorized for detection and/or diagnosis of SARS-CoV-2 by FDA under an Emergency Use Authorization (EUA). This EUA will remain  in effect (meaning this test can be used) for the duration of the COVID-19 declaration under Section 564(b)(1) of the Act, 21 U.S.C. section 360bbb-3(b)(1), unless the authorization is terminated or revoked sooner.     Influenza A by PCR NEGATIVE NEGATIVE Final   Influenza B by PCR NEGATIVE NEGATIVE Final    Comment: (NOTE) The Xpert Xpress SARS-CoV-2/FLU/RSV assay is intended as an aid in  the diagnosis of influenza from Nasopharyngeal swab specimens and  should not be used as a sole basis for treatment. Nasal washings and  aspirates are unacceptable for Xpert Xpress SARS-CoV-2/FLU/RSV  testing.  Fact Sheet for Patients: https://www.moore.com/  Fact Sheet for Healthcare Providers: https://www.young.biz/  This test is not yet approved or cleared  by the [place] FDA and  has been authorized for detection and/or diagnosis of SARS-CoV-2 by  FDA under an Emergency Use Authorization (EUA). This EUA will remain  in effect (meaning this test can be used) for the duration of the  Covid-19 declaration under Section 564(b)(1) of the Act, 21  U.S.C. section 360bbb-3(b)(1), unless the authorization is  terminated or revoked.    Respiratory Syncytial Virus by PCR NEGATIVE NEGATIVE Final    Comment: (NOTE) Fact Sheet for Patients: https://www.moore.com/  Fact Sheet for Healthcare Providers: https://www.young.biz/  This test is not yet approved or cleared by the [place] FDA and  has been authorized for detection and/or diagnosis of SARS-CoV-2 by  FDA under an Emergency Use Authorization (EUA). This EUA will remain  in effect (meaning this test can be used) for the duration of the  COVID-19 declaration under Section 564(b)(1) of the Act, 21 U.S.C.  section 360bbb-3(b)(1), unless the authorization is terminated or  revoked. Performed at [facility], [address], [place], [place] [address]      Discharge Instructions:   Discharge Instructions    Amb Referral to Palliative Care   Complete by: As directed    Diet - low sodium heart healthy   Complete by: As directed    Diet Carb Modified   Complete by: As directed    Face-to-face encounter (required for Medicare/Medicaid patients)   Complete by: As directed    I Jameila Keeny certify that this patient is under my care and that I, or a nurse practitioner or physician's assistant working with me, had a face-to-face encounter that meets the physician face-to-face encounter requirements with this patient on 04/03/2020. The encounter with the patient was in whole, or in part for the following medical condition(s) which is the primary reason for home health care (List medical condition): COPD, Debility   The encounter with the  patient was in whole, or in part, for the following medical condition, which is the primary reason for home health care: COPD, Debility   I certify that, based on my findings, the following services are medically necessary home health services:  Nursing Physical therapy     Reason for Medically Necessary Home Health Services:  Therapy- Investment banker, operational, Patent examiner Skilled Nursing- Change/Decline in Patient Status     My clinical findings support the need for the above services: Shortness of breath with activity   Further, I certify that my clinical findings support that this patient is homebound due to: Shortness of Breath  with activity   Home Health   Complete by: As directed    To provide the following care/treatments:  PT RN     Increase activity slowly   Complete by: As directed      Allergies as of 04/03/2020      Reactions   Diphenhydramine Rash   AGITATION/DELIRIUM   Metformin Diarrhea   Oxybutynin Other (See Comments)   dizzy   Amlodipine Rash   Ciprofloxacin Rash   Lisinopril Rash   Penicillins Rash   Family states was a "long time ago"   Saxagliptin Rash   Sulfamethoxazole-trimethoprim Rash      Medication List    STOP taking these medications   levofloxacin 500 MG tablet Commonly known as: LEVAQUIN     TAKE these medications   apixaban 5 MG Tabs tablet Commonly known as: ELIQUIS Take 1 tablet (5 mg total) by mouth 2 (two) times daily.   aspirin 81 MG EC tablet Take 1 tablet (81 mg total) by mouth daily.   budesonide-formoterol 80-4.5 MCG/ACT inhaler Commonly known as: SYMBICORT Inhale 2 puffs into the lungs 2 (two) times daily.   chlorpheniramine-HYDROcodone 10-8 MG/5ML Suer Commonly known as: Tussionex Pennkinetic ER Take 5 mLs by mouth every 12 (twelve) hours as needed for cough.   esomeprazole 40 MG capsule Commonly known as: NexIUM Take 1 capsule (40 mg total) by mouth daily.   gabapentin 100 MG capsule Commonly known  as: NEURONTIN Take 100 mg by mouth daily.   guaiFENesin 600 MG 12 hr tablet Commonly known as: MUCINEX Take by mouth 2 (two) times daily.   guaiFENesin-dextromethorphan 100-10 MG/5ML syrup Commonly known as: ROBITUSSIN DM Take 10 mLs by mouth 3 (three) times daily.   HumuLIN 70/30 KwikPen (70-30) 100 UNIT/ML KwikPen Generic drug: insulin isophane & regular human Inject 35 Units into the skin 2 (two) times daily.   hydrOXYzine 25 MG tablet Commonly known as: ATARAX/VISTARIL Take 25 mg by mouth 4 (four) times daily as needed for itching.   isosorbide mononitrate 30 MG 24 hr tablet Commonly known as: IMDUR Take 30 mg by mouth daily.   lactobacillus acidophilus Tabs tablet Take 2 tablets by mouth daily.   levothyroxine 50 MCG tablet Commonly known as: SYNTHROID Take 1 tablet (50 mcg total) by mouth daily. PATIENT NEEDS TO SCHEDULE OFFICE VISIT FOR FOLLOW UP   melatonin 5 MG Tabs Take 1 tablet (5 mg total) by mouth at bedtime as needed for up to 10 days (sleep).   metoprolol succinate 100 MG 24 hr tablet Commonly known as: TOPROL-XL Take 1 tablet (100 mg total) by mouth daily. Take with or immediately following a meal.   nitroGLYCERIN 0.4 MG SL tablet Commonly known as: NITROSTAT Place 0.4 mg under the tongue as needed for chest pain.   OXYGEN Inhale 2 L into the lungs.   psyllium 0.52 g capsule Commonly known as: REGULOID Take 0.52 g by mouth daily.   traZODone 50 MG tablet Commonly known as: DESYREL Take 50 mg by mouth at bedtime.   trospium 20 MG tablet Commonly known as: SANCTURA Take 1 tablet (20 mg total) by mouth daily. What changed: when to take this   Ventolin HFA 108 (90 Base) MCG/ACT inhaler Generic drug: albuterol Inhale 2 puffs into the lungs every 6 (six) hours as needed.   vitamin B-12 1000 MCG tablet Commonly known as: CYANOCOBALAMIN Take 1,000 mcg by mouth daily.   vitamin C with rose hips 1000 MG tablet Take 1,000 mg by mouth  daily.     Vitamin D3 125 MCG (5000 UT) Tabs Take 5,000 Units by mouth daily.         Time coordinating discharge: 32 minutes  Signed:  Omah Dewalt  [facility] 04/03/2020, 1:50 PM   Pager on [email or web address]. If 7PM-7AM, please contact night-coverage at [email or web address]

## 2020-04-03 NOTE — Progress Notes (Signed)
Occupational Therapy Treatment Patient Details Name: Peggy [name] MRN: [id number] DOB: [birth year] Today's Date: 04/03/2020    History of present illness Pt is an 86 y/o F admitted on 03/30/20 for worsening SOB & intermittent chest tightness despite trying 2 antibiotics perscribed by outpatient pulmonologist. PMH: COPD on 4L/min at home (24/7), gastric reflux, HTN, HLD, a-fib, arthritis, DM, stroke, TIA   OT comments  Pt. Is on 4LO2. SO2 100%, and HR 66 bpms. Pt. Education was provided about energy conservation, and work simplification techniques for ADL, and IADL functioning. Pt. Was provided with a visual handout. Reviewed pursed lip breathing techniques.  Pt. Continues to benefit from OT services for ADL training, A/E training, and pt. Education about energy conservation, work simplification, home modification, and DME. Discharge disposition remains appropriate for d/cing home with family assist as needed.   Follow Up Recommendations  No OT follow up;Supervision - Intermittent    Equipment Recommendations  None recommended by OT    Recommendations for Other Services      Precautions / Restrictions Precautions Precautions: Fall Restrictions Weight Bearing Restrictions: No       Mobility Bed Mobility Overal bed mobility: Modified Independent                Transfers Overall transfer level: Modified independent Equipment used: 1 person hand held assist Transfers: Sit to/from [employer] Sit to Stand: Supervision Stand pivot transfers: Min guard            Balance                                           ADL either performed or assessed with clinical judgement   ADL                                               Vision       Perception     Praxis      Cognition Arousal/Alertness: Awake/alert Behavior During Therapy: WFL for tasks assessed/performed Overall Cognitive Status: Within Functional Limits  for tasks assessed                                          Exercises     Shoulder Instructions       General Comments      Pertinent Vitals/ Pain       Pain Assessment: No/denies pain  Home Living                                          Prior Functioning/Environment              Frequency  Min 1X/week        Progress Toward Goals  OT Goals(current goals can now be found in the care plan section)  Progress towards OT goals: Progressing toward goals  Acute Rehab OT Goals Patient Stated Goal: to go home OT Goal Formulation: With patient Time For Goal Achievement: 04/14/20 Potential to Achieve Goals: Fair  Plan      Co-evaluation  AM-PAC OT "6 Clicks" Daily Activity     Outcome Measure   Help from another person eating meals?: None Help from another person taking care of personal grooming?: None Help from another person toileting, which includes using toliet, bedpan, or urinal?: A Little Help from another person bathing (including washing, rinsing, drying)?: A Little Help from another person to put on and taking off regular upper body clothing?: None Help from another person to put on and taking off regular lower body clothing?: A Little 6 Click Score: 21    End of Session    OT Visit Diagnosis: Unsteadiness on feet (R26.81);Muscle weakness (generalized) (M62.81)   Activity Tolerance Patient tolerated treatment well   Patient Left in bed;with call bell/phone within reach;with bed alarm set   Nurse Communication Mobility status        Time: 5176-1607 OT Time Calculation (min): 25 min  Charges: OT General Charges $OT Visit: 1 Visit OT Treatments $Self Care/Home Management : 23-37 mins  [name], MS, OTR/L  [name] 04/03/2020, 1:56 PM

## 2020-04-03 NOTE — Progress Notes (Signed)
Inpatient Diabetes Program Recommendations  AACE/ADA: New Consensus Statement on Inpatient Glycemic Control   Target Ranges:  Prepandial:   less than 140 mg/dL      Peak postprandial:   less than 180 mg/dL (1-2 hours)      Critically ill patients:  140 - 180 mg/dL   Results for Peggy [name], Peggy [name] (MRN [id number]) as of 04/03/2020 07:03  Ref. Range 04/02/2020 07:53 04/02/2020 11:21 04/02/2020 16:30 04/02/2020 20:15  Glucose-Capillary Latest Ref Range: 70 - 99 mg/dL 694 (H) 854 (H) 627 (H) 224 (H)   Review of Glycemic Control  Diabetes history: DM2 Outpatient Diabetes medications: 70/30 35 units BID Current orders for Inpatient glycemic control: 70/30 20 units BID, Novolog 0-20 units TID with meals; Prednisone 20 mg daily  Inpatient Diabetes Program Recommendations:    Insulin: Please consider increasing 70/30 to 25 units BID.  Thanks, Orlando Penner, RN, MSN, CDE Diabetes Coordinator Inpatient Diabetes Program [phone] (Team Pager from 8am to 5pm)

## 2020-04-03 NOTE — TOC Transition Note (Signed)
Transition of Care [facility]) - CM/SW Discharge Note   Patient Details  Name: ALONIE GAZZOLA MRN: [id number] Date of Birth: 04-Jan-1929  Transition of Care [facility]) CM/SW Contact:  Maree Krabbe, LCSW Phone Number: 04/03/2020, 3:56 PM   Clinical Narrative:   HH arranged through [facility]. No additional needs at this time.    Final next level of care: Home w Home Health Services Barriers to Discharge: No Barriers Identified   Patient Goals and CMS Choice Patient states their goals for this hospitalization and ongoing recovery are:: to feel better and go home   Choice offered to / list presented to : Patient  Discharge Placement                    Patient and family notified of of transfer: 04/03/20  Discharge Plan and Services   Discharge Planning Services: CM Consult Post Acute Care Choice: Home Health                    HH Arranged: RN [facility] Agency: [facility] Date HH Agency Contacted: 04/03/20 Time HH Agency Contacted: 1555 Representative spoke with at [facility] Agency: Barbara Cower  Social Determinants of Health (SDOH) Interventions     Readmission Risk Interventions Readmission Risk Prevention Plan 04/02/2020 11/24/2018  Transportation Screening Complete -  PCP or Specialist Appt within 5-7 Days - Complete  Home Care Screening - Complete  Medication Review (RN CM) - Complete  HRI or Home Care Consult Complete -  Palliative Care Screening Not Applicable -  Medication Review (RN Care Manager) Complete -  Some recent data might be hidden

## 2020-04-03 NOTE — Progress Notes (Addendum)
[facility] Room 234 [facility]) Hospital Liaison RN note:  New referral received for [facility] out patient palliative to follow post discharge. Patient information given to referral. Velora Mediate, TOC is aware. We will follow for disposition.   Cyndra Numbers, RN [facility] Liaison [phone]

## 2020-04-03 NOTE — Progress Notes (Signed)
Peggy [name] to be D/C'd Home per MD order.  Discussed prescriptions and follow up appointments with the patient. Prescriptions given to patient, prescriptions also electronically submitted medication list explained in detail. Pt and daughter verbalized understanding. eliquis coupon given to patient.    Allergies as of 04/03/2020      Reactions   Diphenhydramine Rash   AGITATION/DELIRIUM   Metformin Diarrhea   Oxybutynin Other (See Comments)   dizzy   Amlodipine Rash   Ciprofloxacin Rash   Lisinopril Rash   Penicillins Rash   Family states was a "long time ago"   Saxagliptin Rash   Sulfamethoxazole-trimethoprim Rash      Medication List    STOP taking these medications   levofloxacin 500 MG tablet Commonly known as: LEVAQUIN     TAKE these medications   apixaban 5 MG Tabs tablet Commonly known as: ELIQUIS Take 1 tablet (5 mg total) by mouth 2 (two) times daily.   aspirin 81 MG EC tablet Take 1 tablet (81 mg total) by mouth daily.   budesonide-formoterol 80-4.5 MCG/ACT inhaler Commonly known as: SYMBICORT Inhale 2 puffs into the lungs 2 (two) times daily.   chlorpheniramine-HYDROcodone 10-8 MG/5ML Suer Commonly known as: Tussionex Pennkinetic ER Take 5 mLs by mouth every 12 (twelve) hours as needed for cough.   esomeprazole 40 MG capsule Commonly known as: NexIUM Take 1 capsule (40 mg total) by mouth daily.   gabapentin 100 MG capsule Commonly known as: NEURONTIN Take 100 mg by mouth daily.   guaiFENesin 600 MG 12 hr tablet Commonly known as: MUCINEX Take by mouth 2 (two) times daily.   guaiFENesin-dextromethorphan 100-10 MG/5ML syrup Commonly known as: ROBITUSSIN DM Take 10 mLs by mouth 3 (three) times daily.   HumuLIN 70/30 KwikPen (70-30) 100 UNIT/ML KwikPen Generic drug: insulin isophane & regular human Inject 35 Units into the skin 2 (two) times daily.   hydrOXYzine 25 MG tablet Commonly known as: ATARAX/VISTARIL Take 25 mg by mouth 4 (four) times  daily as needed for itching.   isosorbide mononitrate 30 MG 24 hr tablet Commonly known as: IMDUR Take 30 mg by mouth daily.   lactobacillus acidophilus Tabs tablet Take 2 tablets by mouth daily.   levothyroxine 50 MCG tablet Commonly known as: SYNTHROID Take 1 tablet (50 mcg total) by mouth daily. PATIENT NEEDS TO SCHEDULE OFFICE VISIT FOR FOLLOW UP   melatonin 5 MG Tabs Take 1 tablet (5 mg total) by mouth at bedtime as needed for up to 10 days (sleep).   metoprolol succinate 100 MG 24 hr tablet Commonly known as: TOPROL-XL Take 1 tablet (100 mg total) by mouth daily. Take with or immediately following a meal.   nitroGLYCERIN 0.4 MG SL tablet Commonly known as: NITROSTAT Place 0.4 mg under the tongue as needed for chest pain.   OXYGEN Inhale 2 L into the lungs.   psyllium 0.52 g capsule Commonly known as: REGULOID Take 0.52 g by mouth daily.   traZODone 50 MG tablet Commonly known as: DESYREL Take 50 mg by mouth at bedtime.   trospium 20 MG tablet Commonly known as: SANCTURA Take 1 tablet (20 mg total) by mouth daily. What changed: when to take this   Ventolin HFA 108 (90 Base) MCG/ACT inhaler Generic drug: albuterol Inhale 2 puffs into the lungs every 6 (six) hours as needed.   vitamin B-12 1000 MCG tablet Commonly known as: CYANOCOBALAMIN Take 1,000 mcg by mouth daily.   vitamin C with rose hips 1000 MG tablet  Take 1,000 mg by mouth daily.   Vitamin D3 125 MCG (5000 UT) Tabs Take 5,000 Units by mouth daily.       Vitals:   04/03/20 1448 04/03/20 1602  BP:    Pulse: 62   Resp: 20   Temp:  98.2 F (36.8 C)  SpO2: 100%     Skin clean, dry and intact without evidence of skin break down, no evidence of skin tears noted. IV catheter discontinued intact. Site without signs and symptoms of complications. Dressing and pressure applied. Pt denies pain at this time. No complaints noted.  An After Visit Summary was printed and given to the patient. Patient  escorted via WC, and D/C home via private auto.  Joannah Gitlin Marylou Flesher

## 2020-04-03 NOTE — TOC Progression Note (Signed)
Transition of Care [facility]) - Progression Note    Patient Details  Name: Peggy [name] MRN: [id number] Date of Birth: Jun 20, 1928  Transition [facility] Care [facility]) CM/SW Contact  Maree Krabbe, LCSW Phone Number: 04/03/2020, 12:56 PM  Clinical Narrative:   Pt wants HH RN--only,  with [facility], will need orders prior to d/c.   Expected Discharge Plan: Home w Home Health Services Barriers to Discharge: Continued Medical Work up  Expected Discharge Plan and Services Expected Discharge Plan: Home w Home Health Services   Discharge Planning Services: CM Consult Post Acute Care Choice: Home Health Living arrangements for the past 2 months: Single Family Home Expected Discharge Date: 04/02/20                         HH Arranged: RN HH Agency: [facility] Date HH Agency Contacted: 04/02/20   Representative spoke with at [facility] Agency: Barbara Cower   Social Determinants of Health (SDOH) Interventions    Readmission Risk Interventions Readmission Risk Prevention Plan 04/02/2020 11/24/2018  Transportation Screening Complete -  PCP or Specialist Appt within 5-7 Days - Complete  Home Care Screening - Complete  Medication Review (RN CM) - Complete  HRI or Home Care Consult Complete -  Palliative Care Screening Not Applicable -  Medication Review (RN Care Manager) Complete -  Some recent data might be hidden

## 2020-04-12 ENCOUNTER — Other Ambulatory Visit: Payer: Self-pay

## 2020-04-12 ENCOUNTER — Ambulatory Visit: Payer: Medicare Other | Admitting: Internal Medicine

## 2020-04-12 ENCOUNTER — Encounter: Payer: Self-pay | Admitting: Internal Medicine

## 2020-04-12 VITALS — BP 158/72 | HR 70 | Temp 97.8°F | Resp 16 | Ht 62.0 in | Wt 177.4 lb

## 2020-04-12 DIAGNOSIS — Z91199 Patient's noncompliance with other medical treatment and regimen due to unspecified reason: Secondary | ICD-10-CM

## 2020-04-12 MED ORDER — ALPRAZOLAM 0.25 MG PO TABS: ORAL_TABLET | ORAL | 3 refills | Status: DC

## 2020-04-12 MED ORDER — AZITHROMYCIN 250 MG PO TABS: ORAL_TABLET | ORAL | 0 refills | Status: DC

## 2020-04-12 NOTE — Progress Notes (Signed)
error 

## 2020-04-14 ENCOUNTER — Other Ambulatory Visit: Payer: Self-pay

## 2020-04-14 ENCOUNTER — Emergency Department: Payer: Medicare Other

## 2020-04-14 ENCOUNTER — Inpatient Hospital Stay
Admission: EM | Admit: 2020-04-14 | Discharge: 2020-04-17 | DRG: 309 | Disposition: A | Discharge status: Home / Self Care | Payer: Medicare Other | Attending: Internal Medicine | Admitting: Internal Medicine

## 2020-04-14 ENCOUNTER — Encounter: Payer: Self-pay | Admitting: Emergency Medicine

## 2020-04-14 DIAGNOSIS — D631 Anemia in chronic kidney disease: Secondary | ICD-10-CM | POA: Diagnosis present

## 2020-04-14 DIAGNOSIS — Z880 Allergy status to penicillin: Secondary | ICD-10-CM

## 2020-04-14 DIAGNOSIS — M25571 Pain in right ankle and joints of right foot: Secondary | ICD-10-CM | POA: Diagnosis present

## 2020-04-14 DIAGNOSIS — K921 Melena: Secondary | ICD-10-CM | POA: Diagnosis present

## 2020-04-14 DIAGNOSIS — Z66 Do not resuscitate: Secondary | ICD-10-CM | POA: Diagnosis present

## 2020-04-14 DIAGNOSIS — Z9049 Acquired absence of other specified parts of digestive tract: Secondary | ICD-10-CM

## 2020-04-14 DIAGNOSIS — Z801 Family history of malignant neoplasm of trachea, bronchus and lung: Secondary | ICD-10-CM | POA: Diagnosis not present

## 2020-04-14 DIAGNOSIS — Z87891 Personal history of nicotine dependence: Secondary | ICD-10-CM

## 2020-04-14 DIAGNOSIS — Z79899 Other long term (current) drug therapy: Secondary | ICD-10-CM

## 2020-04-14 DIAGNOSIS — Z882 Allergy status to sulfonamides status: Secondary | ICD-10-CM

## 2020-04-14 DIAGNOSIS — Z90710 Acquired absence of both cervix and uterus: Secondary | ICD-10-CM

## 2020-04-14 DIAGNOSIS — E1165 Type 2 diabetes mellitus with hyperglycemia: Secondary | ICD-10-CM | POA: Diagnosis present

## 2020-04-14 DIAGNOSIS — I480 Paroxysmal atrial fibrillation: Principal | ICD-10-CM | POA: Diagnosis present

## 2020-04-14 DIAGNOSIS — I4891 Unspecified atrial fibrillation: Secondary | ICD-10-CM | POA: Diagnosis present

## 2020-04-14 DIAGNOSIS — Z955 Presence of coronary angioplasty implant and graft: Secondary | ICD-10-CM

## 2020-04-14 DIAGNOSIS — G2581 Restless legs syndrome: Secondary | ICD-10-CM | POA: Diagnosis present

## 2020-04-14 DIAGNOSIS — I2510 Atherosclerotic heart disease of native coronary artery without angina pectoris: Secondary | ICD-10-CM

## 2020-04-14 DIAGNOSIS — Z881 Allergy status to other antibiotic agents status: Secondary | ICD-10-CM

## 2020-04-14 DIAGNOSIS — Z20822 Contact with and (suspected) exposure to covid-19: Secondary | ICD-10-CM | POA: Diagnosis present

## 2020-04-14 DIAGNOSIS — J9611 Chronic respiratory failure with hypoxia: Secondary | ICD-10-CM | POA: Diagnosis present

## 2020-04-14 DIAGNOSIS — J449 Chronic obstructive pulmonary disease, unspecified: Secondary | ICD-10-CM | POA: Diagnosis present

## 2020-04-14 DIAGNOSIS — R040 Epistaxis: Secondary | ICD-10-CM | POA: Diagnosis present

## 2020-04-14 DIAGNOSIS — Z803 Family history of malignant neoplasm of breast: Secondary | ICD-10-CM

## 2020-04-14 DIAGNOSIS — Z9981 Dependence on supplemental oxygen: Secondary | ICD-10-CM | POA: Diagnosis not present

## 2020-04-14 DIAGNOSIS — Z7951 Long term (current) use of inhaled steroids: Secondary | ICD-10-CM | POA: Diagnosis not present

## 2020-04-14 DIAGNOSIS — K219 Gastro-esophageal reflux disease without esophagitis: Secondary | ICD-10-CM | POA: Diagnosis present

## 2020-04-14 DIAGNOSIS — E039 Hypothyroidism, unspecified: Secondary | ICD-10-CM | POA: Diagnosis present

## 2020-04-14 DIAGNOSIS — Z7982 Long term (current) use of aspirin: Secondary | ICD-10-CM | POA: Diagnosis not present

## 2020-04-14 DIAGNOSIS — E119 Type 2 diabetes mellitus without complications: Secondary | ICD-10-CM

## 2020-04-14 DIAGNOSIS — Z8673 Personal history of transient ischemic attack (TIA), and cerebral infarction without residual deficits: Secondary | ICD-10-CM | POA: Diagnosis not present

## 2020-04-14 DIAGNOSIS — E1169 Type 2 diabetes mellitus with other specified complication: Secondary | ICD-10-CM

## 2020-04-14 DIAGNOSIS — I10 Essential (primary) hypertension: Secondary | ICD-10-CM | POA: Diagnosis not present

## 2020-04-14 DIAGNOSIS — I248 Other forms of acute ischemic heart disease: Secondary | ICD-10-CM | POA: Diagnosis present

## 2020-04-14 DIAGNOSIS — Z6831 Body mass index (BMI) 31.0-31.9, adult: Secondary | ICD-10-CM

## 2020-04-14 DIAGNOSIS — E669 Obesity, unspecified: Secondary | ICD-10-CM | POA: Diagnosis present

## 2020-04-14 DIAGNOSIS — G8929 Other chronic pain: Secondary | ICD-10-CM | POA: Diagnosis present

## 2020-04-14 DIAGNOSIS — Z808 Family history of malignant neoplasm of other organs or systems: Secondary | ICD-10-CM | POA: Diagnosis not present

## 2020-04-14 DIAGNOSIS — I25119 Atherosclerotic heart disease of native coronary artery with unspecified angina pectoris: Secondary | ICD-10-CM | POA: Diagnosis present

## 2020-04-14 DIAGNOSIS — R7989 Other specified abnormal findings of blood chemistry: Secondary | ICD-10-CM

## 2020-04-14 DIAGNOSIS — Z794 Long term (current) use of insulin: Secondary | ICD-10-CM | POA: Diagnosis not present

## 2020-04-14 LAB — COMPREHENSIVE METABOLIC PANEL
ALT: 15 U/L (ref 0–44)
AST: 16 U/L (ref 15–41)
Albumin: 3.4 g/dL — ABNORMAL LOW (ref 3.5–5.0)
Alkaline Phosphatase: 48 U/L (ref 38–126)
Anion gap: 9 (ref 5–15)
BUN: 14 mg/dL (ref 8–23)
CO2: 26 mmol/L (ref 22–32)
Calcium: 9 mg/dL (ref 8.9–10.3)
Chloride: 102 mmol/L (ref 98–111)
Creatinine, Ser: 0.76 mg/dL (ref 0.44–1.00)
GFR, Estimated: >60 mL/min (ref >60)
Glucose, Bld: 103 mg/dL — ABNORMAL HIGH (ref 70–99)
Potassium: 4 mmol/L (ref 3.5–5.1)
Sodium: 137 mmol/L (ref 135–145)
Total Bilirubin: 0.6 mg/dL (ref 0.3–1.2)
Total Protein: 6.5 g/dL (ref 6.5–8.1)

## 2020-04-14 LAB — CBC WITH DIFFERENTIAL/PLATELET
Abs Immature Granulocytes: 0.04 10*3/uL (ref 0.00–0.07)
Basophils Absolute: 0.1 10*3/uL (ref 0.0–0.1)
Basophils Relative: 1 %
Eosinophils Absolute: 0.3 10*3/uL (ref 0.0–0.5)
Eosinophils Relative: 3 %
HCT: 33.8 % — ABNORMAL LOW (ref 36.0–46.0)
Hemoglobin: 10.2 g/dL — ABNORMAL LOW (ref 12.0–15.0)
Immature Granulocytes: 1 %
Lymphocytes Relative: 16 %
Lymphs Abs: 1.4 10*3/uL (ref 0.7–4.0)
MCH: 25.9 pg — ABNORMAL LOW (ref 26.0–34.0)
MCHC: 30.2 g/dL (ref 30.0–36.0)
MCV: 85.8 fL (ref 80.0–100.0)
Monocytes Absolute: 0.6 10*3/uL (ref 0.1–1.0)
Monocytes Relative: 7 %
Neutro Abs: 6.3 10*3/uL (ref 1.7–7.7)
Neutrophils Relative %: 72 %
Platelets: 273 10*3/uL (ref 150–400)
RBC: 3.94 MIL/uL (ref 3.87–5.11)
RDW: 14.8 % (ref 11.5–15.5)
WBC: 8.7 10*3/uL (ref 4.0–10.5)
nRBC: 0 % (ref 0.0–0.2)

## 2020-04-14 LAB — TYPE AND SCREEN
ABO/RH(D): A POS
Antibody Screen: NEGATIVE

## 2020-04-14 LAB — PROTIME-INR
INR: 1.1 (ref 0.8–1.2)
Prothrombin Time: 13.7 seconds (ref 11.4–15.2)

## 2020-04-14 LAB — TROPONIN I (HIGH SENSITIVITY)
Troponin I (High Sensitivity): 306 ng/L (ref <18)
Troponin I (High Sensitivity): 248 ng/L (ref <18)
Troponin I (High Sensitivity): 339 ng/L (ref <18)
Troponin I (High Sensitivity): 380 ng/L (ref <18)
Troponin I (High Sensitivity): 367 ng/L (ref <18)

## 2020-04-14 LAB — RESPIRATORY PANEL BY RT PCR (FLU A&B, COVID)
Influenza A by PCR: NEGATIVE
Influenza B by PCR: NEGATIVE
SARS Coronavirus 2 by RT PCR: NEGATIVE

## 2020-04-14 LAB — BRAIN NATRIURETIC PEPTIDE: B Natriuretic Peptide: 478.7 pg/mL — ABNORMAL HIGH (ref 0.0–100.0)

## 2020-04-14 LAB — IRON AND TIBC
Iron: 33 ug/dL (ref 28–170)
Saturation Ratios: 8 % — ABNORMAL LOW (ref 10.4–31.8)
TIBC: 398 ug/dL (ref 250–450)
UIBC: 365 ug/dL

## 2020-04-14 LAB — FOLATE: Folate: 25 ng/mL (ref >5.9)

## 2020-04-14 LAB — RETICULOCYTES
Immature Retic Fract: 23.2 % — ABNORMAL HIGH (ref 2.3–15.9)
RBC.: 3.94 MIL/uL (ref 3.87–5.11)
Retic Count, Absolute: 74.1 10*3/uL (ref 19.0–186.0)
Retic Ct Pct: 1.9 % (ref 0.4–3.1)

## 2020-04-14 LAB — T4, FREE: Free T4: 0.8 ng/dL (ref 0.61–1.12)

## 2020-04-14 LAB — FERRITIN: Ferritin: 14 ng/mL (ref 11–307)

## 2020-04-14 LAB — GLUCOSE, CAPILLARY: Glucose-Capillary: 165 mg/dL — ABNORMAL HIGH (ref 70–99)

## 2020-04-14 LAB — TSH: TSH: 4.385 u[IU]/mL (ref 0.350–4.500)

## 2020-04-14 MED ORDER — LEVOTHYROXINE SODIUM 50 MCG PO TABS
50.0000 ug | ORAL_TABLET | Freq: Every day | ORAL | Status: DC
  Administered 2020-04-15 – 2020-04-17 (×3): 50 ug via ORAL
  Filled 2020-04-14 (×3): qty 1

## 2020-04-14 MED ORDER — DIGOXIN 0.25 MG/ML IJ SOLN
0.5000 mg | Freq: Once | INTRAMUSCULAR | Status: AC
  Administered 2020-04-14: 0.5 mg via INTRAVENOUS
  Filled 2020-04-14: qty 2

## 2020-04-14 MED ORDER — ALPRAZOLAM 0.25 MG PO TABS
0.2500 mg | ORAL_TABLET | Freq: Every day | ORAL | Status: DC
  Administered 2020-04-16: 0.25 mg via ORAL
  Filled 2020-04-14: qty 1

## 2020-04-14 MED ORDER — DILTIAZEM HCL-DEXTROSE 125-5 MG/125ML-% IV SOLN (PREMIX)
5.0000 mg/h | INTRAVENOUS | Status: DC
  Administered 2020-04-14: 5 mg/h via INTRAVENOUS
  Filled 2020-04-14: qty 125

## 2020-04-14 MED ORDER — ASPIRIN 81 MG PO CHEW
81.0000 mg | CHEWABLE_TABLET | Freq: Every day | ORAL | Status: DC
  Administered 2020-04-14 – 2020-04-17 (×4): 81 mg via ORAL
  Filled 2020-04-14 (×4): qty 1

## 2020-04-14 MED ORDER — FLUTICASONE FUROATE-VILANTEROL 100-25 MCG/INH IN AEPB
1.0000 | INHALATION_SPRAY | Freq: Every day | RESPIRATORY_TRACT | Status: DC
  Administered 2020-04-15 – 2020-04-17 (×3): 1 via RESPIRATORY_TRACT
  Filled 2020-04-14: qty 28

## 2020-04-14 MED ORDER — ONDANSETRON HCL 4 MG/2ML IJ SOLN: 4.0000 mg | Freq: Four times a day (QID) | INTRAMUSCULAR | Status: DC | PRN

## 2020-04-14 MED ORDER — INSULIN ASPART 100 UNIT/ML ~~LOC~~ SOLN
0.0000 [IU] | Freq: Three times a day (TID) | SUBCUTANEOUS | Status: DC
  Administered 2020-04-15: 3 [IU] via SUBCUTANEOUS
  Administered 2020-04-15 (×2): 2 [IU] via SUBCUTANEOUS
  Administered 2020-04-16: 5 [IU] via SUBCUTANEOUS
  Administered 2020-04-16: 2 [IU] via SUBCUTANEOUS
  Filled 2020-04-14 (×5): qty 1

## 2020-04-14 MED ORDER — ISOSORBIDE MONONITRATE ER 30 MG PO TB24
30.0000 mg | ORAL_TABLET | Freq: Every day | ORAL | Status: DC
  Administered 2020-04-15 – 2020-04-17 (×3): 30 mg via ORAL
  Filled 2020-04-14 (×3): qty 1

## 2020-04-14 MED ORDER — GABAPENTIN 100 MG PO CAPS
100.0000 mg | ORAL_CAPSULE | Freq: Every day | ORAL | Status: DC
  Administered 2020-04-14 – 2020-04-16 (×3): 100 mg via ORAL
  Filled 2020-04-14 (×3): qty 1

## 2020-04-14 MED ORDER — INSULIN ASPART 100 UNIT/ML ~~LOC~~ SOLN
0.0000 [IU] | Freq: Every day | SUBCUTANEOUS | Status: DC
  Administered 2020-04-15 – 2020-04-16 (×2): 2 [IU] via SUBCUTANEOUS
  Filled 2020-04-14 (×2): qty 1

## 2020-04-14 MED ORDER — DILTIAZEM HCL 25 MG/5ML IV SOLN
10.0000 mg | Freq: Once | INTRAVENOUS | Status: AC
  Administered 2020-04-14: 10 mg via INTRAVENOUS
  Filled 2020-04-14: qty 5

## 2020-04-14 MED ORDER — TRAZODONE HCL 50 MG PO TABS
50.0000 mg | ORAL_TABLET | Freq: Every evening | ORAL | Status: DC | PRN
  Administered 2020-04-15: 50 mg via ORAL
  Filled 2020-04-14: qty 1

## 2020-04-14 MED ORDER — ACETAMINOPHEN 500 MG PO TABS
500.0000 mg | ORAL_TABLET | Freq: Four times a day (QID) | ORAL | Status: DC | PRN
  Administered 2020-04-14 – 2020-04-16 (×3): 500 mg via ORAL
  Filled 2020-04-14 (×3): qty 1

## 2020-04-14 MED ORDER — PANTOPRAZOLE SODIUM 40 MG IV SOLR
40.0000 mg | INTRAVENOUS | Status: DC
  Administered 2020-04-14 – 2020-04-15 (×2): 40 mg via INTRAVENOUS
  Filled 2020-04-14 (×2): qty 40

## 2020-04-14 MED ORDER — VITAMIN D3 25 MCG (1000 UNIT) PO TABS
5000.0000 [IU] | ORAL_TABLET | Freq: Every day | ORAL | Status: DC
  Administered 2020-04-14 – 2020-04-17 (×3): 5000 [IU] via ORAL
  Filled 2020-04-14 (×7): qty 5

## 2020-04-14 MED ORDER — ALBUTEROL SULFATE HFA 108 (90 BASE) MCG/ACT IN AERS
2.0000 | INHALATION_SPRAY | Freq: Four times a day (QID) | RESPIRATORY_TRACT | Status: DC | PRN
  Filled 2020-04-14: qty 6.7

## 2020-04-14 MED ORDER — METOPROLOL TARTRATE 5 MG/5ML IV SOLN: 5.0000 mg | Freq: Once | INTRAVENOUS | Status: DC

## 2020-04-14 NOTE — Assessment & Plan Note (Signed)
Pt has been having [place] stools for past few days and epistaxis for past 3 days. Pt has been off Eliquis since the epistaxis started .\ IV ppi started. GI Consult per AM team MD. Anemia panel. Suspect pt is bleeding chronically and will need gi eval.

## 2020-04-14 NOTE — Assessment & Plan Note (Signed)
Pt is currently on 4L  at home and has abnormal ausculation with rhonchi diffusely all throughout.

## 2020-04-14 NOTE — Assessment & Plan Note (Signed)
Suspect due to iron def. Will supplement with iron infusion once anemia panel results.

## 2020-04-14 NOTE — Progress Notes (Addendum)
Pt states takes gabapentin at home for neuropathy. Notify NP  Ouma. Will continue to monitor.  Update 2000: pt states brought cane to the ED but was not present on transfer to 2A PCU. Called ED and talked to serenity and inform that the cane was left in the ED. Serenity states will call back. Pt made aware.   Update 2015: Ouma NP ordered gabapentin 100 mg capsule daily at bedtime. Will continue to monitor.  Update 2147: Pt cane was retrieved from ED. Gilmer Mor was hand over to pt at bedside. Will continue to monitor.  Update 2329: Pt is requesting something for sleep and states sometimes takes trazodone for sleep at home. Pt troponin was also going at 306, 248,339, and 380. Still waiting for the last troponin result that was drawn at 2300. No complaints of chest pain at this time. NP Ouma notified. Will continue to monitor.  Update 2357: Ouma NP ordered trazodone 50 mg tablet PRN at bedtime. Will continue to monitor.  Update 0231: last trops resulted at 367 NP Ouma. No order place. Will continue to monitor.

## 2020-04-14 NOTE — Plan of Care (Signed)
  Problem: Education: Goal: Knowledge of General Education information will improve Description Including pain rating scale, medication(s)/side effects and non-pharmacologic comfort measures Outcome: Progressing   Problem: Clinical Measurements: Goal: Ability to maintain clinical measurements within normal limits will improve Outcome: Progressing Goal: Respiratory complications will improve Outcome: Progressing   Problem: Pain Managment: Goal: General experience of comfort will improve Outcome: Progressing   Problem: Safety: Goal: Ability to remain free from injury will improve Outcome: Progressing   

## 2020-04-14 NOTE — ED Notes (Signed)
Rate increased to 10 per protocal

## 2020-04-14 NOTE — ED Provider Notes (Signed)
[facility] Emergency Department Provider Note    First MD Initiated Contact with Patient 04/14/20 1147     (approximate)  I have reviewed the triage vital signs and the nursing notes.   HISTORY  Chief Complaint Epistaxis and Rectal Bleeding    HPI Peggy [name] is a 86 y.o. female who presents to the ER over concern for generalized weakness as well as dark stools as well as nosebleed.   Was recently hospitalized for COPD exacerbation and noted to be in A. fib on Eliquis.  She has not taken Eliquis in over a week due to intolerance with nosebleeds as well as history of GI bleeds.  She not complaining of chest pain.  Noted to be in A. fib with RVR and heart rates in the 150s 160s though she took her home medications this morning.  Has a longstanding history of A. fib with RVR.  Had recent echo that showed preserved EF.   Past Medical History:  Diagnosis Date  . A-fib (HCC)   . Allergy   . Arthritis   . COPD (chronic obstructive pulmonary disease) (HCC)   . Diabetes mellitus without complication (HCC)   . GERD (gastroesophageal reflux disease)   . Hyperlipidemia   . Hypertension   . Oxygen dependent    2 liters  . Stroke [facility])    5 years ago per daughter  . TIA (transient ischemic attack)    Family History  Problem Relation Age of Onset  . Breast cancer Mother   . Pancreatitis Father   . Breast cancer Sister   . Lung cancer Brother   . Melanoma Brother   . Throat cancer Brother    Past Surgical History:  Procedure Laterality Date  . ABDOMINAL HYSTERECTOMY  1970  . BACK SURGERY  2013  . colectomy Right 10/08/2013   Dr. Egbert Garibaldi  . CORONARY ANGIOPLASTY WITH STENT PLACEMENT     Patient Active Problem List   Diagnosis Date Noted  . Atrial fibrillation with rapid ventricular response (HCC) 04/14/2020  . COPD with acute exacerbation (HCC) 03/30/2020  . Elevated troponin level 03/30/2020  . Leukocytosis 03/30/2020  . COPD (chronic obstructive  pulmonary disease) (HCC) 03/22/2020  . Uncontrolled hypertension 11/22/2018  . Chronic respiratory failure with hypoxia (HCC) 10/08/2017  . Simple chronic bronchitis (HCC) 10/08/2017  . SOB (shortness of breath) 09/24/2017  . History of stroke 08/17/2017  . Acute respiratory failure with hypoxia and hypercarbia (HCC) 08/07/2017  . Respiratory failure (HCC) 08/07/2017  . History of GI bleed 07/07/2017  . Atrial fibrillation with RVR (HCC) 06/30/2017  . Bilateral carotid artery disease (HCC) 04/14/2017  . Healthcare maintenance 04/14/2017  . Dizziness 04/07/2017  . 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 TIA (transient ischemic attack) 12/10/2016  . CVA (cerebral vascular accident) (HCC) 12/09/2016  . Bilateral arm pain 11/27/2016  . Myocardial infarction (HCC) 07/30/2015  . Urinary frequency 02/09/2015  . RUQ abdominal pain 01/26/2015  . Shortness of breath 01/26/2015  . Anxiety 01/26/2015  . Chronic pruritus 12/21/2014  . Cerebral vascular accident (HCC) 11/16/2014  . Chronic low back pain 11/16/2014  . Adult hypothyroidism 11/15/2014  . Allergic rhinitis 11/15/2014  . Body mass index (BMI) of 28.0-28.9 in adult 11/15/2014  . Arthritis 11/15/2014  . Cramps of lower extremity 11/15/2014  . Essential (primary) hypertension 11/15/2014  . Acid reflux 11/15/2014  . Hypercholesteremia 11/15/2014  . Cannot sleep 11/15/2014  . Psoriasis 11/15/2014  . Itch of  skin 11/15/2014  . Restless leg 11/15/2014  . Diabetes mellitus, type 2 (HCC) 11/15/2014  . Breath shortness 11/15/2014  . Dermatitis due to unknown cause 04/10/2014  . Arteriosclerosis of coronary artery 02/20/2014  . AF (paroxysmal atrial fibrillation) (HCC) 02/20/2014  . Temporary cerebral vascular dysfunction 02/20/2014  . DD (diverticular disease) 10/05/2013      Prior to Admission medications   Medication Sig Start Date End Date Taking? Authorizing Provider  acetaminophen (TYLENOL) 500 MG tablet  Take 500-1,000 mg by mouth every 6 (six) hours as needed for mild pain or moderate pain.   Yes [provider]  ALPRAZolam (XANAX) 0.25 MG tablet TAKE ONE TAB PO QHS FOR SLEEP Patient taking differently: Take 0.25 mg by mouth at bedtime.  04/12/20  Yes Lyndon Code, MD  aspirin EC 81 MG EC tablet Take 1 tablet (81 mg total) by mouth daily. 03/09/19  Yes Mayo, Allyn Kenner, MD  azithromycin [facility]) 250 MG tablet Take one tab po qod for copd 04/12/20  Yes Lyndon Code, MD  budesonide-formoterol [facility]) 80-4.5 MCG/ACT inhaler Inhale 2 puffs into the lungs 2 (two) times daily. 02/14/20  Yes Lyndon Code, MD  Cholecalciferol (VITAMIN D3) 5000 units TABS Take 5,000 Units by mouth daily.   Yes [provider]  esomeprazole (NEXIUM) 40 MG capsule Take 1 capsule (40 mg total) by mouth daily. 01/05/20  Yes Scarboro, Coralee North, NP  ferrous sulfate 325 (65 FE) MG tablet Take 325 mg by mouth daily with breakfast.   Yes [provider]  gabapentin (NEURONTIN) 100 MG capsule Take 100 mg by mouth at bedtime.  11/23/19  Yes [provider]  HUMULIN 70/30 KWIKPEN (70-30) 100 UNIT/ML KwikPen Inject 35 Units into the skin 2 (two) times daily. Patient taking differently: Inject 36 Units into the skin 2 (two) times daily.  04/01/20  Yes Lurene Shadow, MD  hydrOXYzine (ATARAX/VISTARIL) 25 MG tablet Take 25 mg by mouth 4 (four) times daily as needed for itching. 06/05/17  Yes [provider]  isosorbide mononitrate (IMDUR) 30 MG 24 hr tablet Take 30 mg by mouth daily. 11/09/19  Yes [provider]  levothyroxine (SYNTHROID) 50 MCG tablet Take 50 mcg by mouth daily before breakfast.   Yes [provider]  metoprolol succinate (TOPROL-XL) 100 MG 24 hr tablet Take 1 tablet (100 mg total) by mouth daily. Take with or immediately following a meal. 11/25/18  Yes Ouma, Hubbard Hartshorn, NP  nitroGLYCERIN (NITROSTAT) 0.4 MG SL tablet Place 0.4 mg under the tongue every 5  (five) minutes as needed for chest pain.  09/22/19 09/21/20 Yes [provider]  traZODone (DESYREL) 50 MG tablet Take 50 mg by mouth at bedtime as needed for sleep.  03/07/20  Yes [provider]  trospium (SANCTURA) 20 MG tablet Take 1 tablet (20 mg total) by mouth daily. 04/01/20  Yes Lurene Shadow, MD  VENTOLIN HFA 108 (90 Base) MCG/ACT inhaler Inhale 2 puffs into the lungs every 6 (six) hours as needed for wheezing or shortness of breath.    Yes [provider]  vitamin B-12 (CYANOCOBALAMIN) 1000 MCG tablet Take 1,000 mcg by mouth daily.   Yes [provider]    Allergies Diphenhydramine, Metformin, Oxybutynin, Amlodipine, Ciprofloxacin, Lisinopril, Penicillins, Saxagliptin, and Sulfamethoxazole-trimethoprim    Social History Social History   Tobacco Use  . Smoking status: Former Smoker    Packs/day: 1.00    Years: 30.00    Pack years: 30.00    Quit  date: 11/30/1999    Years since quitting: 20.3  . Smokeless tobacco: Never Used  Vaping Use  . Vaping Use: Never used  Substance Use Topics  . Alcohol use: No  . Drug use: No    Review of Systems Patient denies headaches, rhinorrhea, blurry vision, numbness, shortness of breath, chest pain, edema, cough, abdominal pain, nausea, vomiting, diarrhea, dysuria, fevers, rashes or hallucinations unless otherwise stated above in HPI. ____________________________________________   PHYSICAL EXAM:  VITAL SIGNS: Vitals:   04/14/20 1445 04/14/20 1500  BP:  103/77  Pulse: 64 62  Resp: 20 (!) 23  Temp:    SpO2: 99% 99%    Constitutional: Alert and oriented.  Eyes: Conjunctivae are normal.  Head: Atraumatic. Nose: No congestion/rhinnorhea. Mouth/Throat: Mucous membranes are moist.   Neck: No stridor. Painless ROM.  Cardiovascular: Tachycardic irregularly irregular grossly normal heart sounds.  Good peripheral circulation. Respiratory: Normal respiratory effort.  No retractions. Lungs  CTAB. Gastrointestinal: Soft and nontender. No distention. No abdominal bruits. No CVA tenderness. Genitourinary: Soft brown stool with a few specks of what look like blood.  Is not melanotic.  Weakly guaiac positive. Musculoskeletal: No lower extremity tenderness nor edema.  No joint effusions. Neurologic:  Normal speech and language. No gross focal neurologic deficits are appreciated. No facial droop Skin:  Skin is warm, dry and intact. No rash noted. Psychiatric: Mood and affect are normal. Speech and behavior are normal.  ____________________________________________   LABS (all labs ordered are listed, but only abnormal results are displayed)  Results for orders placed or performed during the hospital encounter of 04/14/20 (from the past 24 hour(s))  CBC with Differential     Status: Abnormal   Collection Time: 04/14/20 11:54 AM  Result Value Ref Range   WBC 8.7 4.0 - 10.5 K/uL   RBC 3.94 3.87 - 5.11 MIL/uL   Hemoglobin 10.2 (L) 12.0 - 15.0 g/dL   HCT 17.4 (L) 36 - 46 %   MCV 85.8 80.0 - 100.0 fL   MCH 25.9 (L) 26.0 - 34.0 pg   MCHC 30.2 30.0 - 36.0 g/dL   RDW 94.4 96.7 - 59.1 %   Platelets 273 150 - 400 K/uL   nRBC 0.0 0.0 - 0.2 %   Neutrophils Relative % 72 %   Neutro Abs 6.3 1.7 - 7.7 K/uL   Lymphocytes Relative 16 %   Lymphs Abs 1.4 0.7 - 4.0 K/uL   Monocytes Relative 7 %   Monocytes Absolute 0.6 0.1 - 1.0 K/uL   Eosinophils Relative 3 %   Eosinophils Absolute 0.3 0.0 - 0.5 K/uL   Basophils Relative 1 %   Basophils Absolute 0.1 0.0 - 0.1 K/uL   Immature Granulocytes 1 %   Abs Immature Granulocytes 0.04 0.00 - 0.07 K/uL  Comprehensive metabolic panel     Status: Abnormal   Collection Time: 04/14/20 11:54 AM  Result Value Ref Range   Sodium 137 135 - 145 mmol/L   Potassium 4.0 3.5 - 5.1 mmol/L   Chloride 102 98 - 111 mmol/L   CO2 26 22 - 32 mmol/L   Glucose, Bld 103 (H) 70 - 99 mg/dL   BUN 14 8 - 23 mg/dL   Creatinine, Ser 6.38 0.44 - 1.00 mg/dL   Calcium 9.0  8.9 - 46.6 mg/dL   Total Protein 6.5 6.5 - 8.1 g/dL   Albumin 3.4 (L) 3.5 - 5.0 g/dL   AST 16 15 - 41 U/L   ALT 15 0 - 44 U/L  Alkaline Phosphatase 48 38 - 126 U/L   Total Bilirubin 0.6 0.3 - 1.2 mg/dL   GFR, Estimated >29 >52 mL/min   Anion gap 9 5 - 15  Troponin I (High Sensitivity)     Status: Abnormal   Collection Time: 04/14/20 11:54 AM  Result Value Ref Range   Troponin I (High Sensitivity) 306 (HH) <18 ng/L  Protime-INR     Status: None   Collection Time: 04/14/20 11:54 AM  Result Value Ref Range   Prothrombin Time 13.7 11.4 - 15.2 seconds   INR 1.1 0.8 - 1.2  Type and screen [facility]     Status: None   Collection Time: 04/14/20 11:55 AM  Result Value Ref Range   ABO/RH(D) A POS    Antibody Screen NEG    Sample Expiration      04/17/2020,2359 Performed at [facility], [address] [place], [place] [address]   Respiratory Panel by RT PCR (Flu A&B, Covid) - Nasopharyngeal Swab     Status: None   Collection Time: 04/14/20 11:55 AM   Specimen: Nasopharyngeal Swab  Result Value Ref Range   SARS Coronavirus 2 by RT PCR NEGATIVE NEGATIVE   Influenza A by PCR NEGATIVE NEGATIVE   Influenza B by PCR NEGATIVE NEGATIVE  Troponin I (High Sensitivity)     Status: Abnormal   Collection Time: 04/14/20  1:59 PM  Result Value Ref Range   Troponin I (High Sensitivity) 248 (HH) <18 ng/L   ____________________________________________  EKG My review and personal interpretation at Time: 11:53   Indication: weakness  Rate: 150  Rhythm: afib with rvr Axis: normal Other: normal intervals, no stemi ____________________________________________  RADIOLOGY  I personally reviewed all radiographic images ordered to evaluate for the above acute complaints and reviewed radiology reports and findings.  These findings were personally discussed with the patient.  Please see medical record for radiology  report.  ____________________________________________   PROCEDURES  Procedure(s) performed:  .Critical Care Performed by: Willy Eddy, MD Authorized by: Willy Eddy, MD   Critical care provider statement:    Critical care time (minutes):  35   Critical care time was exclusive of:  Separately billable procedures and treating other patients   Critical care was necessary to treat or prevent imminent or life-threatening deterioration of the following conditions:  Cardiac failure   Critical care was time spent personally by me on the following activities:  Development of treatment plan with patient or surrogate, discussions with consultants, evaluation of patient's response to treatment, examination of patient, obtaining history from patient or surrogate, ordering and performing treatments and interventions, ordering and review of laboratory studies, ordering and review of radiographic studies, pulse oximetry, re-evaluation of patient's condition and review of old charts      Critical Care performed: yes ____________________________________________   INITIAL IMPRESSION / ASSESSMENT AND PLAN / ED COURSE  Pertinent labs & imaging results that were available during my care of the patient were reviewed by me and considered in my medical decision making (see chart for details).   DDX: A. fib with RVR, dehydration, electrolyte abnormality, ACS, CHF, GI bleed, anemia sepsis  Peggy [name] is a 86 y.o. who presents to the ED with presentation as described above.  Patient is presenting for concern over melena with past several days as well as occasional epistaxis.  Blood work does not show any evidence of acute anemia.  She has noted be in A. fib with RVR since she been compliant  with her medications.  Will order IV diltiazem for rate control.  Additional blood work ordered for the above differential.  Anticipate patient will require hospitalization.  Clinical Course as of Apr 14 1517   Sat Apr 14, 2020  1335 DRE does have weakly guaiac positive stool but no melena or hematochezia.  Hemoglobin is stable as compared to previous.  Troponin is elevated from previous this may be secondary to demand ischemia in the setting of her A. Fib with RVR.  I am hesitant to heparinize based on report of epistaxis and concern for GI bleed.  We will continue with diltiazem infusion.  Will consult with Cardiology.   [PR]  1357 Case was discussed in consultation with Dr. Juliann Pares of cardiology who is recommending trying a single dose of digoxin given her history of persistent A. fib to see if that advises better rate control.  Agrees with plan to withhold heparin at this point given the concern for intolerance of anticoagulation and bleeding.  She remains chest pain-free at this time.   [PR]    Clinical Course User Index [PR] Willy Eddy, MD    The patient was evaluated in Emergency Department today for the symptoms described in the history of present illness. He/she was evaluated in the context of the global COVID-19 pandemic, which necessitated consideration that the patient might be at risk for infection with the SARS-CoV-2 virus that causes COVID-19. Institutional protocols and algorithms that pertain to the evaluation of patients at risk for COVID-19 are in a state of rapid change based on information released by regulatory bodies including the CDC and federal and state organizations. These policies and algorithms were followed during the patient's care in the ED.  As part of my medical decision making, I reviewed the following data within the electronic MEDICAL RECORD NUMBER Nursing notes reviewed and incorporated, Labs reviewed, notes from prior ED visits and [place] Controlled Substance Database   ____________________________________________   FINAL CLINICAL IMPRESSION(S) / ED DIAGNOSES  Final diagnoses:  Atrial fibrillation with RVR (HCC)      NEW MEDICATIONS STARTED DURING THIS  VISIT:  New Prescriptions   No medications on file     Note:  This document was prepared using Dragon voice recognition software and may include unintentional dictation errors.    Willy Eddy, MD 04/14/20 [phone]

## 2020-04-14 NOTE — Assessment & Plan Note (Signed)
PTA levothyroxine at 50 mcg will conti home dose. Thyroid function test .

## 2020-04-14 NOTE — ED Notes (Signed)
Called pharmacy and asked them to send dose of digoxin to ER

## 2020-04-14 NOTE — Assessment & Plan Note (Addendum)
SSI/Carb consistent diet/ hba1c 2 weeks ago was 7.4. Home regimen of 70/30 held

## 2020-04-14 NOTE — Assessment & Plan Note (Signed)
Pt started on IV ppi .  home nexium held.

## 2020-04-14 NOTE — Consult Note (Signed)
CARDIOLOGY CONSULT NOTE               Patient ID: Peggy [name] MRN: [id number] DOB/AGE: 1928-09-10 86 y.o.  Admit date: 04/14/2020 Referring Physician Dr. Irena Cords hospitalist Primary Physician Einar Crow MD primary Primary Cardiologist Dr Peyton Bottoms Reason for Consultation atrial fibrillation rapid ventricular response  HPI: Patient is a 86 year old female recently admitted with GI bleeding atrial fibrillation felt weak and fatigued at home thought she noticed dark-colored stools came to the emergency room. Unfortunately she was found to be in rapid atrial fibrillation around 140 but complained of fatigue weakness mild shortness of breath denied any chest pain patient was treated for rapid atrial fibrillation subsequently received digoxin to help with rate and hopefully rhythm patient's biggest concern is that she was anticoagulated with Eliquis but that was discontinued upon her last admission because of GI bleeding  Review of systems complete and found to be negative unless listed above     Past Medical History:  Diagnosis Date  . A-fib (HCC)   . Allergy   . Arthritis   . COPD (chronic obstructive pulmonary disease) (HCC)   . Diabetes mellitus without complication (HCC)   . GERD (gastroesophageal reflux disease)   . Hyperlipidemia   . Hypertension   . Oxygen dependent    2 liters  . Stroke [facility])    5 years ago per daughter  . TIA (transient ischemic attack)     Past Surgical History:  Procedure Laterality Date  . ABDOMINAL HYSTERECTOMY  1970  . BACK SURGERY  2013  . colectomy Right 10/08/2013   Dr. Egbert Garibaldi  . CORONARY ANGIOPLASTY WITH STENT PLACEMENT      Medications Prior to Admission  Medication Sig Dispense Refill Last Dose  . acetaminophen (TYLENOL) 500 MG tablet Take 500-1,000 mg by mouth every 6 (six) hours as needed for mild pain or moderate pain.   Unknown at PRN  . ALPRAZolam (XANAX) 0.25 MG tablet TAKE ONE TAB PO QHS FOR SLEEP (Patient taking  differently: Take 0.25 mg by mouth at bedtime. ) 30 tablet 3 04/13/2020 at 2000  . aspirin EC 81 MG EC tablet Take 1 tablet (81 mg total) by mouth daily. 30 tablet 0   . azithromycin (ZITHROMAX) 250 MG tablet Take one tab po qod for copd 10 tablet 0   . budesonide-formoterol (SYMBICORT) 80-4.5 MCG/ACT inhaler Inhale 2 puffs into the lungs 2 (two) times daily. 1 each 0 04/14/2020 at 0800  . Cholecalciferol (VITAMIN D3) 5000 units TABS Take 5,000 Units by mouth daily.     Marland Kitchen esomeprazole (NEXIUM) 40 MG capsule Take 1 capsule (40 mg total) by mouth daily. 30 capsule 3 04/14/2020 at 0800  . ferrous sulfate 325 (65 FE) MG tablet Take 325 mg by mouth daily with breakfast.     . gabapentin (NEURONTIN) 100 MG capsule Take 100 mg by mouth at bedtime.    04/13/2020 at 2000  . HUMULIN 70/30 KWIKPEN (70-30) 100 UNIT/ML KwikPen Inject 35 Units into the skin 2 (two) times daily. (Patient taking differently: Inject 36 Units into the skin 2 (two) times daily. )   04/14/2020 at 0800  . hydrOXYzine (ATARAX/VISTARIL) 25 MG tablet Take 25 mg by mouth 4 (four) times daily as needed for itching.  5 Unknown at PRN  . isosorbide mononitrate (IMDUR) 30 MG 24 hr tablet Take 30 mg by mouth daily.   04/14/2020 at 0800  . levothyroxine (SYNTHROID) 50 MCG tablet Take 50 mcg by mouth daily  before breakfast.   04/14/2020 at 0730  . metoprolol succinate (TOPROL-XL) 100 MG 24 hr tablet Take 1 tablet (100 mg total) by mouth daily. Take with or immediately following a meal. 30 tablet 0 04/14/2020 at 0800  . nitroGLYCERIN (NITROSTAT) 0.4 MG SL tablet Place 0.4 mg under the tongue every 5 (five) minutes as needed for chest pain.    Unknown at PRN  . traZODone (DESYREL) 50 MG tablet Take 50 mg by mouth at bedtime as needed for sleep.    Unknown at PRN  . trospium (SANCTURA) 20 MG tablet Take 1 tablet (20 mg total) by mouth daily.   04/14/2020 at 0800  . VENTOLIN HFA 108 (90 Base) MCG/ACT inhaler Inhale 2 puffs into the lungs every 6 (six)  hours as needed for wheezing or shortness of breath.   1 Unknown at PRN  . vitamin B-12 (CYANOCOBALAMIN) 1000 MCG tablet Take 1,000 mcg by mouth daily.      Social History   Socioeconomic History  . Marital status: Widowed    Spouse name: Not on file  . Number of children: 3  . Years of education: H/S  . Highest education level: Not on file  Occupational History  . Occupation: Retired  Tobacco Use  . Smoking status: Former Smoker    Packs/day: 1.00    Years: 30.00    Pack years: 30.00    Quit date: 11/30/1999    Years since quitting: 20.3  . Smokeless tobacco: Never Used  Vaping Use  . Vaping Use: Never used  Substance and Sexual Activity  . Alcohol use: No  . Drug use: No  . Sexual activity: Not on file  Other Topics Concern  . Not on file  Social History Narrative  . Not on file   Social Determinants of Health   Financial Resource Strain:   . Difficulty of Paying Living Expenses: Not on file  Food Insecurity:   . Worried About Programme researcher, broadcasting/film/video in the Last Year: Not on file  . Ran Out of Food in the Last Year: Not on file  Transportation Needs:   . Lack of Transportation (Medical): Not on file  . Lack of Transportation (Non-Medical): Not on file  Physical Activity:   . Days of Exercise per Week: Not on file  . Minutes of Exercise per Session: Not on file  Stress:   . Feeling of Stress : Not on file  Social Connections:   . Frequency of Communication with Friends and Family: Not on file  . Frequency of Social Gatherings with Friends and Family: Not on file  . Attends Religious Services: Not on file  . Active Member of Clubs or Organizations: Not on file  . Attends Banker Meetings: Not on file  . Marital Status: Not on file  Intimate Partner Violence:   . Fear of Current or Ex-Partner: Not on file  . Emotionally Abused: Not on file  . Physically Abused: Not on file  . Sexually Abused: Not on file    Family History  Problem Relation Age of  Onset  . Breast cancer Mother   . Pancreatitis Father   . Breast cancer Sister   . Lung cancer Brother   . Melanoma Brother   . Throat cancer Brother       Review of systems complete and found to be negative unless listed above      PHYSICAL EXAM  General: Well developed, well nourished, in no acute distress HEENT:  Normocephalic and atramatic Neck:  No JVD.  Lungs: Clear bilaterally to auscultation and percussion. Heart: HRRR . Normal S1 and S2 without gallops or murmurs.  Abdomen: Bowel sounds are positive, abdomen soft and non-tender  Msk:  Back normal, normal gait. Normal strength and tone for age. Extremities: No clubbing, cyanosis or edema.   Neuro: Alert and oriented X 3. Psych:  Good affect, responds appropriately  Labs:   Lab Results  Component Value Date   WBC 8.7 04/14/2020   HGB 10.2 (L) 04/14/2020   HCT 33.8 (L) 04/14/2020   MCV 85.8 04/14/2020   PLT 273 04/14/2020    Recent Labs  Lab 04/14/20 1154  NA 137  K 4.0  CL 102  CO2 26  BUN 14  CREATININE 0.76  CALCIUM 9.0  PROT 6.5  BILITOT 0.6  ALKPHOS 48  ALT 15  AST 16  GLUCOSE 103*   Lab Results  Component Value Date   CKTOTAL 130 12/10/2016   CKMB 3.4 10/06/2013   TROPONINI 0.52 (HH) 11/22/2018    Lab Results  Component Value Date   CHOL 216 (H) 03/07/2019   CHOL 175 12/10/2016   CHOL 272 (H) 09/04/2015   Lab Results  Component Value Date   HDL 33 (L) 03/07/2019   HDL 29 (L) 12/10/2016   HDL 33 (L) 09/04/2015   Lab Results  Component Value Date   LDLCALC 110 (H) 03/07/2019   LDLCALC 69 12/10/2016   LDLCALC Comment 09/04/2015   Lab Results  Component Value Date   TRIG 365 (H) 03/07/2019   TRIG 387 (H) 12/10/2016   TRIG 722 (HH) 09/04/2015   Lab Results  Component Value Date   CHOLHDL 6.5 03/07/2019   CHOLHDL 6.0 12/10/2016   CHOLHDL 8.2 (H) 09/04/2015   No results found for: LDLDIRECT    Radiology: DG Chest 2 View  Result Date: 03/30/2020 CLINICAL DATA:   Shortness of breath for 3 weeks EXAM: CHEST - 2 VIEW COMPARISON:  03/22/2020 FINDINGS: Cardiac silhouette remains mildly enlarged. Atherosclerotic calcification of the aortic knob. Mildly hyperexpanded lungs. Chronically coarsened interstitial markings bilaterally. No focal airspace consolidation. No pleural effusion or pneumothorax. IMPRESSION: Stable exam.  No acute cardiopulmonary process. Electronically Signed   By: Duanne Guess D.O.   On: 03/30/2020 12:55   DG Chest 2 View  Result Date: 03/23/2020 CLINICAL DATA:  Dry cough and shortness of breath for 1 week. Ex-smoker. EXAM: CHEST - 2 VIEW COMPARISON:  01/26/2020 FINDINGS: The cardiac silhouette remains borderline enlarged. Stable hyperexpansion of the lungs diffuse prominence of the interstitial markings without pleural fluid. Diffuse osteopenia. IMPRESSION: No acute abnormality. Stable changes of COPD. Electronically Signed   By: Beckie Salts M.D.   On: 03/23/2020 20:47   DG Chest Portable 1 View  Result Date: 04/14/2020 CLINICAL DATA:  Shortness of breath. Chronic cough. COPD. Weakness. EXAM: PORTABLE CHEST 1 VIEW COMPARISON:  03/30/2020 FINDINGS: Indistinct pulmonary vasculature with prominence of upper zone vessels suspicious for pulmonary venous hypertension. Mild cardiomegaly is present. No overt edema or discrete airspace opacity. Atherosclerotic calcification of the aortic arch. No blunting of the costophrenic angles. Mild biapical pleuroparenchymal scarring. IMPRESSION: 1. Mild cardiomegaly with pulmonary venous hypertension. Electronically Signed   By: Gaylyn Rong M.D.   On: 04/14/2020 13:06   ECHOCARDIOGRAM COMPLETE  Result Date: 04/02/2020    ECHOCARDIOGRAM REPORT   Patient Name:   Peggy [name] Date of Exam: 04/01/2020 Medical Rec #:  [id number]   Height:  62.0 in Accession #:    [id number]  Weight:       170.3 lb Date of Birth:  [birth year]   BSA:          1.785 m Patient Age:    89 years    BP:           139/77 mmHg  Patient Gender: F           HR:           61 bpm. Exam Location:  [facility] Procedure: 2D Echo, Cardiac Doppler and Color Doppler Indications:     CHF-Acute Diastolic 428.31 / I50.31  History:         Patient has prior history of Echocardiogram examinations. TIA;                  Risk Factors:Hypertension.  Sonographer:     Neysa Bonito Roar Referring Phys:  [id number] Diagnosing Phys: Alwyn Pea MD IMPRESSIONS  1. Left ventricular ejection fraction, by estimation, is 55 to 60%. The left ventricle has normal function. The left ventricle has no regional wall motion abnormalities. Left ventricular diastolic parameters were normal.  2. Right ventricular systolic function is normal. The right ventricular size is normal.  3. The mitral valve is normal in structure. No evidence of mitral valve regurgitation.  4. The aortic valve is normal in structure. Aortic valve regurgitation is not visualized. FINDINGS  Left Ventricle: Left ventricular ejection fraction, by estimation, is 55 to 60%. The left ventricle has normal function. The left ventricle has no regional wall motion abnormalities. The left ventricular internal cavity size was normal in size. There is  no left ventricular hypertrophy. Left ventricular diastolic parameters were normal. Right Ventricle: The right ventricular size is normal. No increase in right ventricular wall thickness. Right ventricular systolic function is normal. Left Atrium: Left atrial size was normal in size. Right Atrium: Right atrial size was normal in size. Pericardium: There is no evidence of pericardial effusion. Mitral Valve: The mitral valve is normal in structure. No evidence of mitral valve regurgitation. Tricuspid Valve: The tricuspid valve is normal in structure. Tricuspid valve regurgitation is not demonstrated. Aortic Valve: The aortic valve is normal in structure. Aortic valve regurgitation is not visualized. Aortic valve peak gradient measures 13.1 mmHg. Pulmonic Valve:  The pulmonic valve was normal in structure. Pulmonic valve regurgitation is not visualized. Aorta: The ascending aorta was not well visualized. IAS/Shunts: No atrial level shunt detected by color flow Doppler.  LEFT VENTRICLE PLAX 2D LVIDd:         3.96 cm  Diastology LVIDs:         2.87 cm  LV e' medial:    4.79 cm/s LV PW:         1.16 cm  LV E/e' medial:  26.1 LV IVS:        1.27 cm  LV e' lateral:   6.31 cm/s LVOT diam:     1.70 cm  LV E/e' lateral: 19.8 LVOT Area:     2.27 cm  RIGHT VENTRICLE RV Mid diam:    3.06 cm RV S prime:     13.10 cm/s TAPSE (M-mode): 2.5 cm LEFT ATRIUM             Index       RIGHT ATRIUM           Index LA diam:        4.30 cm 2.41 cm/m  RA Area:  14.10 cm LA Vol (A2C):   78.3 ml 43.85 ml/m RA Volume:   29.00 ml  16.24 ml/m LA Vol (A4C):   45.5 ml 25.48 ml/m LA Biplane Vol: 64.2 ml 35.96 ml/m  AORTIC VALVE                PULMONIC VALVE AV Area (Vmax): 1.12 cm    PV Vmax:        1.13 m/s AV Vmax:        181.00 cm/s PV Peak grad:   5.1 mmHg AV Peak Grad:   13.1 mmHg   RVOT Peak grad: 1 mmHg LVOT Vmax:      89.40 cm/s  AORTA Ao Root diam: 2.80 cm MITRAL VALVE MV Area (PHT): 2.37 cm     SHUNTS MV Decel Time: 320 msec     Systemic Diam: 1.70 cm MV E velocity: 125.00 cm/s MV A velocity: 90.00 cm/s MV E/A ratio:  1.39 MV A Prime:    7.1 cm/s Deshawnda Acrey D Keontre Defino MD Electronically signed by Alwyn Pea MD Signature Date/Time: 04/02/2020/4:27:03 PM    Final     EKG: Atrial fibrillation rapid ventricular response 130  ASSESSMENT AND PLAN:  Atrial fibrillation rapid ventricular response Lower GI bleeding Anemia Weakness COPD GERD Diabetes Angina . Plan  Agree with telemetry for atrial fibrillation Currently on Cardizem metoprolol received one dose of digoxin converted to sinus rhythm Poor anticoagulation candidate because of lower GI bleeding Continue inhalers for COPD type symptoms Agree with diabetes management and control Humulin 70/30 Maintain Nexium therapy  for reflux type symptoms Maintain Imdur for anginal type symptoms    Signed: Alwyn Pea MD, 04/14/2020, 10:09 PM

## 2020-04-14 NOTE — H&P (Addendum)
History and Physical    Peggy [name] GBT:[id number] DOB: 04-Jun-1928 DOA: 04/14/2020  PCP: Lauro Regulus, MD    Patient coming from:  Home    Chief Complaint:  [place] stool and epistaxis x3 nights.      HPI: Peggy [name] is a 86 y.o. female with medical history significant of a.fib rvr  htn and h/o GIBleed. Pt sees cariology Dr. Darrold Junker. [place] stools for week and half . Son at bedside and her BP was low and decided to bring her to hospital. Pt initially went to walking clinic and was referred here. Last admission she was started on eliquis and she stopped it because of epistaxis.    ED Course:  Vitals:   04/14/20 1245 04/14/20 1300 04/14/20 1315 04/14/20 1330  BP:    129/84  Pulse: (!) 150 86 96 61  Resp: (!) 28 19 (!) 21 (!) 25  Temp:      TempSrc:      SpO2: 98% 99% 99% 99%  Weight:      Height:        Review of Systems:  Review of Systems  Constitutional: Positive for malaise/fatigue.  HENT: Negative.   Eyes: Negative.   Respiratory: Positive for shortness of breath.        Chronically uses 4L [place] .  Cardiovascular: Negative.   Gastrointestinal: Negative.   Skin: Negative.   Neurological: Negative.     Past Medical History:  Diagnosis Date  . A-fib (HCC)   . Allergy   . Arthritis   . COPD (chronic obstructive pulmonary disease) (HCC)   . Diabetes mellitus without complication (HCC)   . GERD (gastroesophageal reflux disease)   . Hyperlipidemia   . Hypertension   . Oxygen dependent    2 liters  . Stroke [facility])    5 years ago per daughter  . TIA (transient ischemic attack)     Past Surgical History:  Procedure Laterality Date  . ABDOMINAL HYSTERECTOMY  1970  . BACK SURGERY  2013  . colectomy Right 10/08/2013   Dr. Egbert Garibaldi  . CORONARY ANGIOPLASTY WITH STENT PLACEMENT       reports that she quit smoking about 20 years ago. She has a 30.00 pack-year smoking history. She has never used smokeless tobacco. She reports that she does not drink alcohol and  does not use drugs.  Allergies  Allergen Reactions  . Diphenhydramine Rash    AGITATION/DELIRIUM  . Metformin Diarrhea  . Oxybutynin Other (See Comments)    dizzy  . Amlodipine Rash  . Ciprofloxacin Rash  . Lisinopril Rash  . Penicillins Rash    Family states was a "long time ago"  . Saxagliptin Rash  . Sulfamethoxazole-Trimethoprim Rash    Family History  Problem Relation Age of Onset  . Breast cancer Mother   . Pancreatitis Father   . Breast cancer Sister   . Lung cancer Brother   . Melanoma Brother   . Throat cancer Brother     Prior to Admission medications   Medication Sig Start Date End Date Taking? Authorizing Provider  acetaminophen (TYLENOL) 500 MG tablet Take 500-1,000 mg by mouth every 6 (six) hours as needed for mild pain or moderate pain.   Yes [provider]  ALPRAZolam (XANAX) 0.25 MG tablet TAKE ONE TAB PO QHS FOR SLEEP Patient taking differently: Take 0.25 mg by mouth at bedtime.  04/12/20  Yes Lyndon Code, MD  aspirin EC 81 MG EC tablet Take 1 tablet (  81 mg total) by mouth daily. 03/09/19  Yes Mayo, Allyn Kenner, MD  azithromycin [facility]) 250 MG tablet Take one tab po qod for copd 04/12/20  Yes Lyndon Code, MD  budesonide-formoterol [facility]) 80-4.5 MCG/ACT inhaler Inhale 2 puffs into the lungs 2 (two) times daily. 02/14/20  Yes Lyndon Code, MD  Cholecalciferol (VITAMIN D3) 5000 units TABS Take 5,000 Units by mouth daily.   Yes [provider]  esomeprazole (NEXIUM) 40 MG capsule Take 1 capsule (40 mg total) by mouth daily. 01/05/20  Yes Scarboro, Coralee North, NP  ferrous sulfate 325 (65 FE) MG tablet Take 325 mg by mouth daily with breakfast.   Yes [provider]  gabapentin (NEURONTIN) 100 MG capsule Take 100 mg by mouth at bedtime.  11/23/19  Yes [provider]  HUMULIN 70/30 KWIKPEN (70-30) 100 UNIT/ML KwikPen Inject 35 Units into the skin 2 (two) times daily. Patient taking differently: Inject 36 Units into the skin  2 (two) times daily.  04/01/20  Yes Lurene Shadow, MD  hydrOXYzine (ATARAX/VISTARIL) 25 MG tablet Take 25 mg by mouth 4 (four) times daily as needed for itching. 06/05/17  Yes [provider]  isosorbide mononitrate (IMDUR) 30 MG 24 hr tablet Take 30 mg by mouth daily. 11/09/19  Yes [provider]  levothyroxine (SYNTHROID) 50 MCG tablet Take 50 mcg by mouth daily before breakfast.   Yes [provider]  metoprolol succinate (TOPROL-XL) 100 MG 24 hr tablet Take 1 tablet (100 mg total) by mouth daily. Take with or immediately following a meal. 11/25/18  Yes Ouma, Hubbard Hartshorn, NP  nitroGLYCERIN (NITROSTAT) 0.4 MG SL tablet Place 0.4 mg under the tongue every 5 (five) minutes as needed for chest pain.  09/22/19 09/21/20 Yes [provider]  traZODone (DESYREL) 50 MG tablet Take 50 mg by mouth at bedtime as needed for sleep.  03/07/20  Yes [provider]  trospium (SANCTURA) 20 MG tablet Take 1 tablet (20 mg total) by mouth daily. 04/01/20  Yes Lurene Shadow, MD  VENTOLIN HFA 108 (90 Base) MCG/ACT inhaler Inhale 2 puffs into the lungs every 6 (six) hours as needed for wheezing or shortness of breath.    Yes [provider]  vitamin B-12 (CYANOCOBALAMIN) 1000 MCG tablet Take 1,000 mcg by mouth daily.   Yes [provider]    Physical Exam: Vitals:   04/14/20 1245 04/14/20 1300 04/14/20 1315 04/14/20 1330  BP:    129/84  Pulse: (!) 150 86 96 61  Resp: (!) 28 19 (!) 21 (!) 25  Temp:      TempSrc:      SpO2: 98% 99% 99% 99%  Weight:      Height:        Physical Exam Vitals and nursing note reviewed.  HENT:     Head: Normocephalic and atraumatic.     Right Ear: External ear normal.     Left Ear: External ear normal.     Nose: Nose normal.     Mouth/Throat:     Mouth: Mucous membranes are moist.  Eyes:     Extraocular Movements: Extraocular movements intact.     Pupils: Pupils are equal, round, and reactive to light.   Cardiovascular:     Rate and Rhythm: Rhythm irregular.     Pulses: Normal pulses.     Heart sounds: Normal heart sounds.  Pulmonary:     Effort: Pulmonary effort is normal.     Breath sounds: Rhonchi present.  Abdominal:     Palpations: Abdomen is soft.  Musculoskeletal:        General: No swelling.     Cervical back: Normal range of motion.  Skin:    General: Skin is warm.  Neurological:     General: No focal deficit present.     Mental Status: She is alert and oriented to person, place, and time.  Psychiatric:        Mood and Affect: Mood normal.        Behavior: Behavior normal.    Labs on Admission: I have personally reviewed following labs and imaging studies  CBC: Recent Labs  Lab 04/14/20 1154  WBC 8.7  NEUTROABS 6.3  HGB 10.2*  HCT 33.8*  MCV 85.8  PLT 273   Basic Metabolic Panel: Recent Labs  Lab 04/14/20 1154  NA 137  K 4.0  CL 102  CO2 26  GLUCOSE 103*  BUN 14  CREATININE 0.76  CALCIUM 9.0   GFR: Estimated Creatinine Clearance: 46.9 mL/min (by C-G formula based on SCr of 0.76 mg/dL). Liver Function Tests: Recent Labs  Lab 04/14/20 1154  AST 16  ALT 15  ALKPHOS 48  BILITOT 0.6  PROT 6.5  ALBUMIN 3.4*   No results for input(s): LIPASE, AMYLASE in the last 168 hours. No results for input(s): AMMONIA in the last 168 hours. Coagulation Profile: Recent Labs  Lab 04/14/20 1154  INR 1.1   Cardiac Enzymes: No results for input(s): CKTOTAL, CKMB, CKMBINDEX, TROPONINI in the last 168 hours. BNP (last 3 results) No results for input(s): PROBNP in the last 8760 hours. HbA1C: No results for input(s): HGBA1C in the last 72 hours. CBG: No results for input(s): GLUCAP in the last 168 hours. Lipid Profile: No results for input(s): CHOL, HDL, LDLCALC, TRIG, CHOLHDL, LDLDIRECT in the last 72 hours. Thyroid Function Tests: No results for input(s): TSH, T4TOTAL, FREET4, T3FREE, THYROIDAB in the last 72 hours. Anemia Panel: No results for  input(s): VITAMINB12, FOLATE, FERRITIN, TIBC, IRON, RETICCTPCT in the last 72 hours. Urine analysis:    Component Value Date/Time   COLORURINE STRAW (A) 03/30/2020 2030   APPEARANCEUR CLEAR (A) 03/30/2020 2030   APPEARANCEUR Hazy 08/11/2012 1201   LABSPEC 1.011 03/30/2020 2030   LABSPEC 1.020 08/11/2012 1201   PHURINE 6.0 03/30/2020 2030   GLUCOSEU >=500 (A) 03/30/2020 2030   GLUCOSEU Negative 08/11/2012 1201   HGBUR NEGATIVE 03/30/2020 2030   BILIRUBINUR NEGATIVE 03/30/2020 2030   BILIRUBINUR neg 02/11/2015 0950   BILIRUBINUR Negative 08/11/2012 1201   KETONESUR NEGATIVE 03/30/2020 2030   PROTEINUR NEGATIVE 03/30/2020 2030   UROBILINOGEN 0.2 02/11/2015 0950   NITRITE NEGATIVE 03/30/2020 2030   LEUKOCYTESUR NEGATIVE 03/30/2020 2030   LEUKOCYTESUR 1+ 08/11/2012 1201    Intake/Output Summary (Last 24 hours) at 04/14/2020 1455 Last data filed at 04/14/2020 1327 Gross per 24 hour  Intake 2.88 ml  Output --  Net 2.88 ml   Lab Results  Component Value Date   CREATININE 0.76 04/14/2020   CREATININE 1.04 (H) 04/03/2020   CREATININE 1.33 (H) 04/02/2020    COVID-19 Labs  No results for input(s): DDIMER, FERRITIN, LDH, CRP in the last 72 hours.  Lab Results  Component Value Date   SARSCOV2NAA NEGATIVE 04/14/2020   SARSCOV2NAA NEGATIVE 03/30/2020   SARSCOV2NAA NEGATIVE 03/30/2020   SARSCOV2NAA Not Detected 06/16/2019    Radiological Exams on Admission: DG Chest Portable 1 View  Result Date: 04/14/2020 CLINICAL DATA:  Shortness of breath. Chronic cough. COPD. Weakness. EXAM:  PORTABLE CHEST 1 VIEW COMPARISON:  03/30/2020 FINDINGS: Indistinct pulmonary vasculature with prominence of upper zone vessels suspicious for pulmonary venous hypertension. Mild cardiomegaly is present. No overt edema or discrete airspace opacity. Atherosclerotic calcification of the aortic arch. No blunting of the costophrenic angles. Mild biapical pleuroparenchymal scarring. IMPRESSION: 1. Mild  cardiomegaly with pulmonary venous hypertension. Electronically Signed   By: Gaylyn Rong M.D.   On: 04/14/2020 13:06    EKG: Independently reviewed.  A.fib rvr 158.    Assessment/Plan Atrial fibrillation with RVR (HCC) Assessment & Plan Pt currently on diltiazem drip and cardiology dr. Juliann Pares has seen pt and started on digoxin. Pt is off anticoagulation due to melena. Pt is maintained on asa 81. Pt's last echo result as follow: IMPRESSIONS  1. Left ventricular ejection fraction, by estimation, is 55 to 60%. The  left ventricle has normal function. The left ventricle has no regional  wall motion abnormalities. Left ventricular diastolic parameters were  normal.  2. Right ventricular systolic function is normal. The right ventricular  size is normal.  3. The mitral valve is normal in structure. No evidence of mitral valve  regurgitation.  4. The aortic valve is normal in structure. Aortic valve regurgitation is  not visualized.   CAD (coronary artery disease) Assessment & Plan Pt has h/o cad and we will check am lipid panel.  Cont asa 81 and metoprolol to be restarted.  Attribute abnormal troponin to cardiac enzymes to demand ischemia. We will follow. Per cardiology for ischemic eval.    Essential (primary) hypertension Assessment & Plan Blood pressure 103/77, pulse 62, temperature 98.7 F (37.1 C), temperature source Oral, resp. rate (!) 23, height  (1.575 m), weight 80.5 kg, SpO2 99 %. Currently bp is well controlled as pt is normotensive and on diltiazem drip.So we will hold Imdur  And metoprolol until pt off drip and converted to po meds.   Chronic respiratory failure with hypoxia (HCC) Assessment & Plan Pt is currently on 4L [place] at home and has abnormal ausculation with rhonchi diffusely all throughout.   Melena Assessment & Plan Pt has been having [place] stools for past few days and epistaxis for past 3 days. Pt has been off Eliquis since the  epistaxis started .\ IV ppi started. GI Consult per AM team MD. Anemia panel. Suspect pt is bleeding chronically and will need gi eval.  Acid reflux Assessment & Plan Pt started on IV ppi .  home nexium held.    Diabetes mellitus, type 2 (HCC) Assessment & Plan SSI/Carb consistent diet/ hba1c 2 weeks ago was 7.4. Home regimen of 70/30 held  Adult hypothyroidism Assessment & Plan PTA levothyroxine at 50 mcg will conti home dose. Thyroid function test .  Restless leg Assessment & Plan Suspect due to iron def. Will supplement with iron infusion once anemia panel results.    DVT prophylaxis:  heparin  Code Status:  DNR  Family Communication:  Son chuck hayes [phone]. Daughter Evangeline Dakin [phone].  Disposition Plan:  Inpatient   Consults called:  Cardiology Dr. Juliann Pares.  Admission status: Inpatient.    Gertha Calkin MD [facility] [phone] How to contact the [facility] Attending or Consulting provider 7A - 7P or covering provider during after hours 7P -7A, for this patient?    1. Check the care team in [facility] and look for a) attending/consulting [facility] provider listed and b) the [facility] team listed 2. Log into [email or web address] and use [place]'s universal password to access. If you  do not have the password, please contact the hospital operator. 3. Locate the [facility] provider you are looking for under [facility] and page to a number that you can be directly reached. 4. If you still have difficulty reaching the provider, please page the [facility] (Director on Call) for the Hospitalists listed on amion for assistance. [email or web address] Password [facility] 04/14/2020, 2:55 PM

## 2020-04-14 NOTE — Assessment & Plan Note (Signed)
Pt has h/o cad and we will check am lipid panel.  Cont asa 81 and metoprolol to be restarted.

## 2020-04-14 NOTE — Assessment & Plan Note (Signed)
Pt currently on diltiazem drip and cardiology dr. Juliann Pares has seen pt and started on digoxin. Pt is off anticoagulation due to melena. Pt is maintained on asa 81. Pt's last echo result as follow: IMPRESSIONS    1. Left ventricular ejection fraction, by estimation, is 55 to 60%. The  left ventricle has normal function. The left ventricle has no regional  wall motion abnormalities. Left ventricular diastolic parameters were  normal.  2. Right ventricular systolic function is normal. The right ventricular  size is normal.  3. The mitral valve is normal in structure. No evidence of mitral valve  regurgitation.  4. The aortic valve is normal in structure. Aortic valve regurgitation is  not visualized.

## 2020-04-14 NOTE — ED Triage Notes (Signed)
Pt presents to ED via POV on chronic 4L via [place]. Pt c/o [place] stool, nose bleeds and and cramping to bilateral legs. Pt states symptoms started last Thursday. Pt states was prescribed Eliquis. Pt states intermittent nose bleeds, last nose bleed at 0200 this morning. Pt states recently discharged from the hospital for COPD flare up.

## 2020-04-14 NOTE — ED Notes (Signed)
Date and time results received: 04/14/20 1250 Test:troponin Critical Value: 306  Name of Provider Notified: Lyndon Code MD  Orders Received? Or Actions Taken?: MD to follow up

## 2020-04-14 NOTE — Assessment & Plan Note (Signed)
Blood pressure 103/77, pulse 62, temperature 98.7 F (37.1 C), temperature source Oral, resp. rate (!) 23, height 5\' 2"  (1.575 m), weight 80.5 kg, SpO2 99 %. Currently bp is well controlled as pt is normotensive and on diltiazem drip.So we will hold Imdur  And metoprolol until pt off drip and converted to po meds.

## 2020-04-15 DIAGNOSIS — E039 Hypothyroidism, unspecified: Secondary | ICD-10-CM | POA: Diagnosis not present

## 2020-04-15 DIAGNOSIS — K921 Melena: Secondary | ICD-10-CM | POA: Diagnosis not present

## 2020-04-15 DIAGNOSIS — I4891 Unspecified atrial fibrillation: Secondary | ICD-10-CM

## 2020-04-15 DIAGNOSIS — E119 Type 2 diabetes mellitus without complications: Secondary | ICD-10-CM | POA: Diagnosis not present

## 2020-04-15 DIAGNOSIS — J9611 Chronic respiratory failure with hypoxia: Secondary | ICD-10-CM | POA: Diagnosis not present

## 2020-04-15 DIAGNOSIS — I10 Essential (primary) hypertension: Secondary | ICD-10-CM

## 2020-04-15 LAB — CBC WITH DIFFERENTIAL/PLATELET
Abs Immature Granulocytes: 0.07 10*3/uL (ref 0.00–0.07)
Basophils Absolute: 0 10*3/uL (ref 0.0–0.1)
Basophils Relative: 0 %
Eosinophils Absolute: 0.3 10*3/uL (ref 0.0–0.5)
Eosinophils Relative: 3 %
HCT: 31.5 % — ABNORMAL LOW (ref 36.0–46.0)
Hemoglobin: 9.5 g/dL — ABNORMAL LOW (ref 12.0–15.0)
Immature Granulocytes: 1 %
Lymphocytes Relative: 16 %
Lymphs Abs: 1.6 10*3/uL (ref 0.7–4.0)
MCH: 25.6 pg — ABNORMAL LOW (ref 26.0–34.0)
MCHC: 30.2 g/dL (ref 30.0–36.0)
MCV: 84.9 fL (ref 80.0–100.0)
Monocytes Absolute: 0.6 10*3/uL (ref 0.1–1.0)
Monocytes Relative: 6 %
Neutro Abs: 6.9 10*3/uL (ref 1.7–7.7)
Neutrophils Relative %: 74 %
Platelets: 272 10*3/uL (ref 150–400)
RBC: 3.71 MIL/uL — ABNORMAL LOW (ref 3.87–5.11)
RDW: 14.9 % (ref 11.5–15.5)
WBC: 9.5 10*3/uL (ref 4.0–10.5)
nRBC: 0 % (ref 0.0–0.2)

## 2020-04-15 LAB — GLUCOSE, CAPILLARY
Glucose-Capillary: 162 mg/dL — ABNORMAL HIGH (ref 70–99)
Glucose-Capillary: 217 mg/dL — ABNORMAL HIGH (ref 70–99)
Glucose-Capillary: 156 mg/dL — ABNORMAL HIGH (ref 70–99)
Glucose-Capillary: 225 mg/dL — ABNORMAL HIGH (ref 70–99)

## 2020-04-15 LAB — MAGNESIUM: Magnesium: 1.8 mg/dL (ref 1.7–2.4)

## 2020-04-15 LAB — COMPREHENSIVE METABOLIC PANEL
ALT: 12 U/L (ref 0–44)
AST: 13 U/L — ABNORMAL LOW (ref 15–41)
Albumin: 3.1 g/dL — ABNORMAL LOW (ref 3.5–5.0)
Alkaline Phosphatase: 41 U/L (ref 38–126)
Anion gap: 9 (ref 5–15)
BUN: 15 mg/dL (ref 8–23)
CO2: 26 mmol/L (ref 22–32)
Calcium: 9 mg/dL (ref 8.9–10.3)
Chloride: 101 mmol/L (ref 98–111)
Creatinine, Ser: 0.92 mg/dL (ref 0.44–1.00)
GFR, Estimated: 60 mL/min — ABNORMAL LOW (ref >60)
Glucose, Bld: 184 mg/dL — ABNORMAL HIGH (ref 70–99)
Potassium: 4.2 mmol/L (ref 3.5–5.1)
Sodium: 136 mmol/L (ref 135–145)
Total Bilirubin: 0.6 mg/dL (ref 0.3–1.2)
Total Protein: 5.9 g/dL — ABNORMAL LOW (ref 6.5–8.1)

## 2020-04-15 LAB — HEMOGLOBIN A1C
Hgb A1c MFr Bld: 7 % — ABNORMAL HIGH (ref 4.8–5.6)
Mean Plasma Glucose: 154.2 mg/dL

## 2020-04-15 LAB — LIPID PANEL
Cholesterol: 189 mg/dL (ref 0–200)
HDL: 35 mg/dL — ABNORMAL LOW (ref >40)
LDL Cholesterol: 91 mg/dL (ref 0–99)
Total CHOL/HDL Ratio: 5.4 RATIO
Triglycerides: 314 mg/dL — ABNORMAL HIGH (ref <150)
VLDL: 63 mg/dL — ABNORMAL HIGH (ref 0–40)

## 2020-04-15 LAB — VITAMIN B12: Vitamin B-12: 827 pg/mL (ref 180–914)

## 2020-04-15 LAB — PHOSPHORUS: Phosphorus: 3.7 mg/dL (ref 2.5–4.6)

## 2020-04-15 MED ORDER — AMIODARONE HCL 200 MG PO TABS: 200.0000 mg | ORAL_TABLET | Freq: Two times a day (BID) | ORAL | Status: DC

## 2020-04-15 MED ORDER — LEVALBUTEROL HCL 0.63 MG/3ML IN NEBU
0.6300 mg | INHALATION_SOLUTION | Freq: Three times a day (TID) | RESPIRATORY_TRACT | Status: DC
  Administered 2020-04-15 – 2020-04-17 (×5): 0.63 mg via RESPIRATORY_TRACT
  Filled 2020-04-15 (×6): qty 3

## 2020-04-15 MED ORDER — METOPROLOL SUCCINATE ER 100 MG PO TB24
100.0000 mg | ORAL_TABLET | Freq: Every day | ORAL | Status: DC
  Administered 2020-04-15 – 2020-04-16 (×2): 100 mg via ORAL
  Filled 2020-04-15 (×3): qty 1

## 2020-04-15 MED ORDER — DIGOXIN 0.25 MG/ML IJ SOLN
0.5000 mg | Freq: Once | INTRAMUSCULAR | Status: AC
  Administered 2020-04-15: 0.5 mg via INTRAVENOUS
  Filled 2020-04-15: qty 2

## 2020-04-15 MED ORDER — AMIODARONE HCL 200 MG PO TABS
200.0000 mg | ORAL_TABLET | Freq: Two times a day (BID) | ORAL | Status: DC
  Administered 2020-04-15 – 2020-04-17 (×4): 200 mg via ORAL
  Filled 2020-04-15 (×4): qty 1

## 2020-04-15 MED ORDER — AZITHROMYCIN 250 MG PO TABS
500.0000 mg | ORAL_TABLET | Freq: Every day | ORAL | Status: DC
  Administered 2020-04-15: 500 mg via ORAL
  Filled 2020-04-15: qty 2

## 2020-04-15 MED ORDER — LEVALBUTEROL HCL 0.63 MG/3ML IN NEBU: 0.6300 mg | INHALATION_SOLUTION | Freq: Three times a day (TID) | RESPIRATORY_TRACT | Status: DC

## 2020-04-15 NOTE — Consult Note (Signed)
[name], MD [facility]  [address], [address] [place], [place] [address] Phone: [phone] Fax : [phone]  Consultation  Referring Provider:     Dr. Sharolyn Douglas Primary Care Physician:  Lauro Regulus, MD Primary Gastroenterologist: [facility]         Reason for Consultation:     Dark stools  Date of Admission:  04/14/2020 Date of Consultation:  04/15/2020         HPI:   Peggy [name] is a 86 y.o. female who is followed by [facility] and has been seen by them back in March of last year for similar issues.  The patient reports that she has had a nosebleed and has had dark stools for 7 days.  The patient had a colonoscopy in 2015 without any findings to explain any GI bleeding.  The patient was told to stop her NSAIDs last year when she had a GI bleeding and was seen by the nurse practitioner.  The patient has no abdominal pain at the present time but states that she had some stomach upset and was taking some Tums.  There is no report of any unexplained weight loss.  She also has been seen by cardiology who she states recommended she stay on the aspirin but stay off of the blood thinners.  The patient does have a history of diverticulosis from her last colonoscopy by Dr. Mechele Collin. The patient's blood count on admission was 10.2 and this morning was 9.5. The patient is eager to go home at the present time and has no complaints.  The patient reports that she has had 3 bowel movements since yesterday that have all been normal color without any sign of any of the dark stools she was previously reported.  Past Medical History:  Diagnosis Date  . A-fib (HCC)   . Allergy   . Arthritis   . COPD (chronic obstructive pulmonary disease) (HCC)   . Diabetes mellitus without complication (HCC)   . GERD (gastroesophageal reflux disease)   . Hyperlipidemia   . Hypertension   . Oxygen dependent    2 liters  . Stroke [facility])    5 years ago per daughter  . TIA (transient ischemic attack)     Past  Surgical History:  Procedure Laterality Date  . ABDOMINAL HYSTERECTOMY  1970  . BACK SURGERY  2013  . colectomy Right 10/08/2013   Dr. Egbert Garibaldi  . CORONARY ANGIOPLASTY WITH STENT PLACEMENT      Prior to Admission medications   Medication Sig Start Date End Date Taking? Authorizing Provider  acetaminophen (TYLENOL) 500 MG tablet Take 500-1,000 mg by mouth every 6 (six) hours as needed for mild pain or moderate pain.   Yes [provider]  ALPRAZolam (XANAX) 0.25 MG tablet TAKE ONE TAB PO QHS FOR SLEEP Patient taking differently: Take 0.25 mg by mouth at bedtime.  04/12/20  Yes Lyndon Code, MD  aspirin EC 81 MG EC tablet Take 1 tablet (81 mg total) by mouth daily. 03/09/19  Yes Mayo, Allyn Kenner, MD  azithromycin [facility]) 250 MG tablet Take one tab po qod for copd 04/12/20  Yes Lyndon Code, MD  budesonide-formoterol [facility]) 80-4.5 MCG/ACT inhaler Inhale 2 puffs into the lungs 2 (two) times daily. 02/14/20  Yes Lyndon Code, MD  Cholecalciferol (VITAMIN D3) 5000 units TABS Take 5,000 Units by mouth daily.   Yes [provider]  esomeprazole (NEXIUM) 40 MG capsule Take 1 capsule (40 mg total) by  mouth daily. 01/05/20  Yes Scarboro, Coralee North, NP  ferrous sulfate 325 (65 FE) MG tablet Take 325 mg by mouth daily with breakfast.   Yes [provider]  gabapentin (NEURONTIN) 100 MG capsule Take 100 mg by mouth at bedtime.  11/23/19  Yes [provider]  HUMULIN 70/30 KWIKPEN (70-30) 100 UNIT/ML KwikPen Inject 35 Units into the skin 2 (two) times daily. Patient taking differently: Inject 36 Units into the skin 2 (two) times daily.  04/01/20  Yes Lurene Shadow, MD  hydrOXYzine (ATARAX/VISTARIL) 25 MG tablet Take 25 mg by mouth 4 (four) times daily as needed for itching. 06/05/17  Yes [provider]  isosorbide mononitrate (IMDUR) 30 MG 24 hr tablet Take 30 mg by mouth daily. 11/09/19  Yes [provider]  levothyroxine (SYNTHROID) 50 MCG tablet Take 50  mcg by mouth daily before breakfast.   Yes [provider]  metoprolol succinate (TOPROL-XL) 100 MG 24 hr tablet Take 1 tablet (100 mg total) by mouth daily. Take with or immediately following a meal. 11/25/18  Yes Ouma, Hubbard Hartshorn, NP  nitroGLYCERIN (NITROSTAT) 0.4 MG SL tablet Place 0.4 mg under the tongue every 5 (five) minutes as needed for chest pain.  09/22/19 09/21/20 Yes [provider]  traZODone (DESYREL) 50 MG tablet Take 50 mg by mouth at bedtime as needed for sleep.  03/07/20  Yes [provider]  trospium (SANCTURA) 20 MG tablet Take 1 tablet (20 mg total) by mouth daily. 04/01/20  Yes Lurene Shadow, MD  VENTOLIN HFA 108 (90 Base) MCG/ACT inhaler Inhale 2 puffs into the lungs every 6 (six) hours as needed for wheezing or shortness of breath.    Yes [provider]  vitamin B-12 (CYANOCOBALAMIN) 1000 MCG tablet Take 1,000 mcg by mouth daily.   Yes [provider]    Family History  Problem Relation Age of Onset  . Breast cancer Mother   . Pancreatitis Father   . Breast cancer Sister   . Lung cancer Brother   . Melanoma Brother   . Throat cancer Brother      Social History   Tobacco Use  . Smoking status: Former Smoker    Packs/day: 1.00    Years: 30.00    Pack years: 30.00    Quit date: 11/30/1999    Years since quitting: 20.3  . Smokeless tobacco: Never Used  Vaping Use  . Vaping Use: Never used  Substance Use Topics  . Alcohol use: No  . Drug use: No    Allergies as of 04/14/2020 - Review Complete 04/14/2020  Allergen Reaction Noted  . Diphenhydramine Rash 11/16/2014  . Metformin Diarrhea 11/16/2014  . Oxybutynin Other (See Comments) 03/01/2015  . Amlodipine Rash 11/15/2014  . Ciprofloxacin Rash 11/16/2014  . Lisinopril Rash 12/21/2014  . Penicillins Rash 11/15/2014  . Saxagliptin Rash 11/16/2014  . Sulfamethoxazole-trimethoprim Rash 07/30/2018    Review of Systems:    All systems reviewed and negative  except where noted in HPI.   Physical Exam:  Vital signs in last 24 hours: Temp:  [97.8 F (36.6 C)-98.4 F (36.9 C)] 98 F (36.7 C) (11/14 0834) Pulse Rate:  [39-170] 72 (11/14 0834) Resp:  [16-28] 20 (11/14 0834) BP: (100-172)/(39-132) 172/55 (11/14 0834) SpO2:  [94 %-100 %] 100 % (11/14 0834) Weight:  [77.8 kg] 77.8 kg (11/14 0610) Last BM Date: 04/14/20 General:   Pleasant, cooperative in NAD Head:  Normocephalic and atraumatic. Eyes:   No icterus.   Conjunctiva  pink. PERRLA. Ears:  Normal auditory acuity. Neck:  Supple; no masses or thyroidomegaly Lungs: Respirations even and unlabored. Lungs clear to auscultation bilaterally.   No wheezes, crackles, or rhonchi.  Heart:  Regular rate and rhythm;  Without murmur, clicks, rubs or gallops Abdomen:  Soft, nondistended, nontender. Normal bowel sounds. No appreciable masses or hepatomegaly.  No rebound or guarding.  Rectal:  Not performed. Msk:  Symmetrical without gross deformities.    Extremities:  Without edema, cyanosis or clubbing. Neurologic:  Alert and oriented x3;  grossly normal neurologically. Skin:  Intact without significant lesions or rashes. Cervical Nodes:  No significant cervical adenopathy. Psych:  Alert and cooperative. Normal affect.  LAB RESULTS: Recent Labs    04/14/20 1154 04/15/20 0409  WBC 8.7 9.5  HGB 10.2* 9.5*  HCT 33.8* 31.5*  PLT 273 272   BMET Recent Labs    04/14/20 1154 04/15/20 0409  NA 137 136  K 4.0 4.2  CL 102 101  CO2 26 26  GLUCOSE 103* 184*  BUN 14 15  CREATININE 0.76 0.92  CALCIUM 9.0 9.0   LFT Recent Labs    04/15/20 0409  PROT 5.9*  ALBUMIN 3.1*  AST 13*  ALT 12  ALKPHOS 41  BILITOT 0.6   PT/INR Recent Labs    04/14/20 1154  LABPROT 13.7  INR 1.1    STUDIES: DG Chest Portable 1 View  Result Date: 04/14/2020 CLINICAL DATA:  Shortness of breath. Chronic cough. COPD. Weakness. EXAM: PORTABLE CHEST 1 VIEW COMPARISON:  03/30/2020 FINDINGS: Indistinct  pulmonary vasculature with prominence of upper zone vessels suspicious for pulmonary venous hypertension. Mild cardiomegaly is present. No overt edema or discrete airspace opacity. Atherosclerotic calcification of the aortic arch. No blunting of the costophrenic angles. Mild biapical pleuroparenchymal scarring. IMPRESSION: 1. Mild cardiomegaly with pulmonary venous hypertension. Electronically Signed   By: Gaylyn Rong M.D.   On: 04/14/2020 13:06      Impression / Plan:   Assessment: Active Problems:   Adult hypothyroidism   Essential (primary) hypertension   Acid reflux   Restless leg   Diabetes mellitus, type 2 (HCC)   CAD (coronary artery disease)   Atrial fibrillation with RVR (HCC)   Chronic respiratory failure with hypoxia (HCC)   Melena   Peggy [name] is a 86 y.o. y/o female with a nosebleed and dark stools.  The patient has had GI bleeding in the past and was seen last year by [facility] who deferred any endoscopic intervention at that time.  They noted that the patient was told to stop anti-inflammatory medication and due to her pre-existing conditions, colonoscopy 2015 and stable hemoglobin no intervention was recommended at that time.  Plan:  The patient reports that she has had no further dark stools that her last 3 bowel movements were normal color.  The patient did have a nosebleed that can cause heme positive stools and testing stools for blood is part of a screening program for colorectal cancer and has no family in this situation in addition to nosebleeds will also cause her to have heme positive stools.  I will defer any further work-up to her primary gastroenterologist at [facility] as an outpatient.  With normal colored stools and no further sign of any GI bleeding there is nothing further to recommend from GI point of view.  I will sign off.  Please call if any further GI concerns or questions.  We would like to thank you for the opportunity to  participate in the care of KRISALYN YANKOWSKI.     Thank you for involving me in the care of this patient.      LOS: 1 day   [name], MD, [facility] 04/15/2020, 1:02 PM,  Pager [phone] 7am-5pm  Check AMION for 5pm -7am coverage and on weekends   Note: This dictation was prepared with Dragon dictation along with smaller phrase technology. Any transcriptional errors that result from this process are unintentional.

## 2020-04-15 NOTE — Progress Notes (Signed)
CCMD made this RN aware pt had converted into Afib RVR.  MD contacted, new medication order placed and given. Pt converted back into NSR. RN will continue to assess.  Ulis Rias, RN 04/15/2020 6:00 PM

## 2020-04-15 NOTE — Plan of Care (Signed)

## 2020-04-15 NOTE — Evaluation (Signed)
Physical Therapy Evaluation Patient Details Name: Peggy [name] MRN: [id number] DOB: 16-Jan-1929 Today's Date: 04/15/2020   History of Present Illness  Peggy [name] is a 86 y.o. female with medical history significant of a.fib rvr, htn, COPD oxygen dependent 4L, DM, CAD and h/o GIBleed. Pt sees cardiology Dr. Darrold Junker. Rund stools for week and half. BP was low and [name] decided to bring her to hospital. Pt initially went to walk in clinic and was referred here. Note to be in Afib with RVR, with hypotension. Last admission she was started on eliquis and she stopped it because of epistaxis. Patient admitted for further management.  Clinical Impression  Patient demonstrates independence with basic transfers. Supervision with ambulation of 197ft with O2 on 3L, patient with sat of 85%, quickly returned to >88% with pursed lip breathing education. On subsequent 134ft trial nursing present requesting patient ambulate on 2L. PT completed 55ft on 2L, on 02 check patient satting to 77%; quick return to 88% (about 2 mins) with pursed lip breathing and O2 increased to 4L (for remainder of OOB activity). Once in bed patietn is able to maintain O2 >90%, and O2 level of 2L per nursing request. PT recommends OPPT if it is the MD request for O2 weaning, as patient is unable to complete currently safely. If MD is intending to continue O2 management on 4L, unlikely patient will need services for home mobility, may continue to recommend OPPT for community activity. Would benefit from skilled PT to address above deficits and promote optimal return to PLOF.     Follow Up Recommendations Outpatient PT;Supervision for mobility/OOB    Equipment Recommendations  None recommended by PT    Recommendations for Other Services       Precautions / Restrictions        Mobility  Bed Mobility Overal bed mobility: Modified Independent                  Transfers Overall transfer level: Modified independent    Transfers: Sit to/from Stand Sit to Stand: Supervision         General transfer comment: confedience with transfers  Ambulation/Gait Ambulation/Gait assistance: Supervision Gait Distance (Feet): 360 Feet Assistive device: None Gait Pattern/deviations: WFL(Within Functional Limits) Gait velocity: decreased w/o AD   General Gait Details: mild sway in straight plane, but no assist needed for righting. Able to carry on conversation with ambulation without sway  Stairs            Wheelchair Mobility    Modified Rankin (Stroke Patients Only)       Balance Overall balance assessment: Needs assistance Sitting-balance support: Feet supported Sitting balance-Leahy Scale: Normal     Standing balance support: Single extremity supported;No upper extremity supported Standing balance-Leahy Scale: Good Standing balance comment: able to complete ambulating with conversation without AD, no assistance for iminimal righting needed with ambulation               High Level Balance Comments: high level balance deficits noted as pt with LOB to R during gait with and without AD             Pertinent Vitals/Pain Pain Assessment: No/denies pain    Home Living                        Prior Function                 Hand Dominance  Extremity/Trunk Assessment   Upper Extremity Assessment Upper Extremity Assessment: Generalized weakness    Lower Extremity Assessment Lower Extremity Assessment: Generalized weakness    Cervical / Trunk Assessment Cervical / Trunk Assessment: Normal  Communication      Cognition Arousal/Alertness: Awake/alert Behavior During Therapy: WFL for tasks assessed/performed Overall Cognitive Status: Within Functional Limits for tasks assessed                                        General Comments      Exercises Other Exercises Other Exercises: Patient able to complete STS with ind, don/doff  shows. Amb 110ft on 3L of O2 increase to 4L needed following sat to 85% , quick return to 88% with pursed lip breathing education. Subsequent 120ft lap nursing wished for patient to demonstrate ambulation on 2L with sat of 77%, increased to 4L for return to 95% with PLB. Patient left following increased sat on 2L per nursing.   Assessment/Plan    PT Assessment Patient needs continued PT services  PT Problem List Decreased safety awareness;Cardiopulmonary status limiting activity;Decreased balance;Decreased knowledge of use of DME;Decreased activity tolerance       PT Treatment Interventions DME instruction;Therapeutic exercise;Gait training;Balance training;Stair training;Functional mobility training;Patient/family education;Neuromuscular re-education    PT Goals (Current goals can be found in the Care Plan section)  Acute Rehab PT Goals Patient Stated Goal: to go home PT Goal Formulation: With patient Time For Goal Achievement: 04/29/20 Potential to Achieve Goals: Fair    Frequency Min 2X/week   Barriers to discharge        Co-evaluation               AM-PAC PT "6 Clicks" Mobility  Outcome Measure Help needed turning from your back to your side while in a flat bed without using bedrails?: None Help needed moving from lying on your back to sitting on the side of a flat bed without using bedrails?: None Help needed moving to and from a bed to a chair (including a wheelchair)?: None Help needed standing up from a chair using your arms (e.g., wheelchair or bedside chair)?: None Help needed to walk in hospital room?: A Little Help needed climbing 3-5 steps with a railing? : A Little 6 Click Score: 22    End of Session Equipment Utilized During Treatment: Gait belt;Oxygen Activity Tolerance: Patient tolerated treatment well;Patient limited by fatigue Patient left: in bed;with call bell/phone within reach (2L of O2) Nurse Communication: Mobility status PT Visit Diagnosis:  Unsteadiness on feet (R26.81);Other abnormalities of gait and mobility (R26.89);Difficulty in walking, not elsewhere classified (R26.2)    Time: 0153-0220 PT Time Calculation (min) (ACUTE ONLY): 27 min   Charges:   PT Evaluation $PT Eval Moderate Complexity: 1 Mod PT Treatments $Therapeutic Activity: 23-37 mins        Hilda Lias DPT  Hilda Lias 04/15/2020, 3:18 PM

## 2020-04-15 NOTE — Progress Notes (Signed)
PROGRESS NOTE  Peggy [name] Peggy [name]:[id number] DOB: 07-24-1928 DOA: 04/14/2020 PCP: Lauro Regulus, MD  HPI/Recap of past 24 hours: HPI from Dr Olam Idler is a 86 y.o. female with medical history significant of a.fib rvr, htn, COPD oxygen dependent 4L, DM, CAD and h/o GIBleed. Pt sees cardiology Dr. Darrold Junker. Jakubek stools for week and half. BP was low and son decided to bring her to hospital. Pt initially went to walk in clinic and was referred here. Note to be in Afib with RVR, with hypotension. Last admission she was started on eliquis and she stopped it because of epistaxis. Patient admitted for further management.    Today, patient reports some shortness of breath, denies any further dark stools, melena, hematochezia, denies chest pain, abdominal pain, nausea/vomiting, fever/chills.    Assessment/Plan: Active Problems:   Adult hypothyroidism   Essential (primary) hypertension   Acid reflux   Restless leg   Diabetes mellitus, type 2 (HCC)   CAD (coronary artery disease)   Atrial fibrillation with RVR (HCC)   Chronic respiratory failure with hypoxia (HCC)   Melena   Paroxysmal A. fib with RVR Heart rate better controlled, currently normal sinus rhythm S/p diltiazem drip, 1 dose of IV digoxin Echo showed EF of 55 to 60%, no regional wall motion abnormality Continue home metoprolol, aspirin Cardiology consulted, noted to be a poor candidate for [facility] Telemetry  ? Melena Normocytic anemia Possibly GI bleed, but melena could occur with nosebleeds Hemoglobin remained stable Anemia panel showed iron 33, sats 8, ferritin 14 Continue IV PPI, hold off Eliquis GI consulted Daily CBC  Elevated troponin History of CAD Chest pain-free Troponin elevated, with a flat trend EKG with no acute ST changes Likely 2/2 demand ischemia Continue Lipitor, metoprolol, aspirin, Imdur Cardiology consulted  Hypertension BP stable Continue metoprolol  History of COPD Chronic  respiratory failure with hypoxia Continue supplemental home O2 at baseline 4 L Continue home azithromycin, duo nebs  Diabetes mellitus type 2 Last A1c 7 SSI, Accu-Cheks, hypoglycemic protocol  Hypothyroidism Continue Synthroid  Obesity Lifestyle modification advised        Malnutrition Type:      Malnutrition Characteristics:      Nutrition Interventions:       Estimated body mass index is 31.39 kg/m as calculated from the following:   Height as of this encounter: 5\' 2"  (1.575 m).   Weight as of this encounter: 77.8 kg.     Code Status: DNR  Family Communication: Plan to discuss with family  Disposition Plan: Status is: Inpatient  Remains inpatient appropriate because:Inpatient level of care appropriate due to severity of illness   Dispo: The patient is from: Home              Anticipated d/c is to: Home              Anticipated d/c date is: 1 day              Patient currently is not medically stable to d/c.     Consultants:  Cardiology  Procedures:  None  Antimicrobials:  Azithromycin  DVT prophylaxis: SCDs   Objective: Vitals:   04/14/20 1844 04/14/20 2000 04/15/20 0610 04/15/20 0834  BP: (!) 157/65 (!) 139/50 (!) 125/39 (!) 172/55  Pulse: 64 62 72 72  Resp: 18 18 16 20   Temp: 97.8 F (36.6 C) 98 F (36.7 C) 98.4 F (36.9 C) 98 F (36.7 C)  TempSrc: Oral Oral Oral Oral  SpO2: 100% 99% 100% 100%  Weight:   77.8 kg   Height:        Intake/Output Summary (Last 24 hours) at 04/15/2020 1224 Last data filed at 04/15/2020 0955 Gross per 24 hour  Intake 362.88 ml  Output --  Net 362.88 ml   Filed Weights   04/14/20 1138 04/15/20 0610  Weight: 80.5 kg 77.8 kg    Exam:  General: NAD  Cardiovascular: S1, S2 present  Respiratory:  Diminished breath sounds bilaterally with rhonchi present  Abdomen: Soft, nontender, nondistended, bowel sounds present  Musculoskeletal: No bilateral pedal edema noted  Skin:  Normal  Psychiatry: Normal mood    Data Reviewed: CBC: Recent Labs  Lab 04/14/20 1154 04/15/20 0409  WBC 8.7 9.5  NEUTROABS 6.3 6.9  HGB 10.2* 9.5*  HCT 33.8* 31.5*  MCV 85.8 84.9  PLT 273 272   Basic Metabolic Panel: Recent Labs  Lab 04/14/20 1154 04/15/20 0409  NA 137 136  K 4.0 4.2  CL 102 101  CO2 26 26  GLUCOSE 103* 184*  BUN 14 15  CREATININE 0.76 0.92  CALCIUM 9.0 9.0  MG  --  1.8  PHOS  --  3.7   GFR: Estimated Creatinine Clearance: 40.1 mL/min (by C-G formula based on SCr of 0.92 mg/dL). Liver Function Tests: Recent Labs  Lab 04/14/20 1154 04/15/20 0409  AST 16 13*  ALT 15 12  ALKPHOS 48 41  BILITOT 0.6 0.6  PROT 6.5 5.9*  ALBUMIN 3.4* 3.1*   No results for input(s): LIPASE, AMYLASE in the last 168 hours. No results for input(s): AMMONIA in the last 168 hours. Coagulation Profile: Recent Labs  Lab 04/14/20 1154  INR 1.1   Cardiac Enzymes: No results for input(s): CKTOTAL, CKMB, CKMBINDEX, TROPONINI in the last 168 hours. BNP (last 3 results) No results for input(s): PROBNP in the last 8760 hours. HbA1C: Recent Labs    04/14/20 1920  HGBA1C 7.0*   CBG: Recent Labs  Lab 04/14/20 2127 04/15/20 0838 04/15/20 1149  GLUCAP 165* 162* 217*   Lipid Profile: Recent Labs    04/15/20 0409  CHOL 189  HDL 35*  LDLCALC 91  TRIG 641*  CHOLHDL 5.4   Thyroid Function Tests: Recent Labs    04/14/20 1154  TSH 4.385  FREET4 0.80   Anemia Panel: Recent Labs    04/14/20 1154  VITAMINB12 827  FOLATE 25.0  FERRITIN 14  TIBC 398  IRON 33  RETICCTPCT 1.9   Urine analysis:    Component Value Date/Time   COLORURINE STRAW (A) 03/30/2020 2030   APPEARANCEUR CLEAR (A) 03/30/2020 2030   APPEARANCEUR Hazy 08/11/2012 1201   LABSPEC 1.011 03/30/2020 2030   LABSPEC 1.020 08/11/2012 1201   PHURINE 6.0 03/30/2020 2030   GLUCOSEU >=500 (A) 03/30/2020 2030   GLUCOSEU Negative 08/11/2012 1201   HGBUR NEGATIVE 03/30/2020 2030    BILIRUBINUR NEGATIVE 03/30/2020 2030   BILIRUBINUR neg 02/11/2015 0950   BILIRUBINUR Negative 08/11/2012 1201   KETONESUR NEGATIVE 03/30/2020 2030   PROTEINUR NEGATIVE 03/30/2020 2030   UROBILINOGEN 0.2 02/11/2015 0950   NITRITE NEGATIVE 03/30/2020 2030   LEUKOCYTESUR NEGATIVE 03/30/2020 2030   LEUKOCYTESUR 1+ 08/11/2012 1201   Sepsis Labs: @LABRCNTIP (procalcitonin:4,lacticidven:4)  ) Recent Results (from the past 240 hour(s))  Respiratory Panel by RT PCR (Flu A&B, Covid) - Nasopharyngeal Swab     Status: None   Collection Time: 04/14/20 11:55 AM   Specimen: Nasopharyngeal Swab  Result Value Ref Range Status   SARS  Coronavirus 2 by RT PCR NEGATIVE NEGATIVE Final    Comment: (NOTE) SARS-CoV-2 target nucleic acids are NOT DETECTED.  The SARS-CoV-2 RNA is generally detectable in upper respiratoy specimens during the acute phase of infection. The lowest concentration of SARS-CoV-2 viral copies this assay can detect is 131 copies/mL. A negative result does not preclude SARS-Cov-2 infection and should not be used as the sole basis for treatment or other patient management decisions. A negative result may occur with  improper specimen collection/handling, submission of specimen other than nasopharyngeal swab, presence of viral mutation(s) within the areas targeted by this assay, and inadequate number of viral copies (<131 copies/mL). A negative result must be combined with clinical observations, patient history, and epidemiological information. The expected result is Negative.  Fact Sheet for Patients:  https://www.moore.com/  Fact Sheet for Healthcare Providers:  https://www.young.biz/  This test is no t yet approved or cleared by the [place] FDA and  has been authorized for detection and/or diagnosis of SARS-CoV-2 by FDA under an Emergency Use Authorization (EUA). This EUA will remain  in effect (meaning this test can be used) for  the duration of the COVID-19 declaration under Section 564(b)(1) of the Act, 21 U.S.C. section 360bbb-3(b)(1), unless the authorization is terminated or revoked sooner.     Influenza A by PCR NEGATIVE NEGATIVE Final   Influenza B by PCR NEGATIVE NEGATIVE Final    Comment: (NOTE) The Xpert Xpress SARS-CoV-2/FLU/RSV assay is intended as an aid in  the diagnosis of influenza from Nasopharyngeal swab specimens and  should not be used as a sole basis for treatment. Nasal washings and  aspirates are unacceptable for Xpert Xpress SARS-CoV-2/FLU/RSV  testing.  Fact Sheet for Patients: https://www.moore.com/  Fact Sheet for Healthcare Providers: https://www.young.biz/  This test is not yet approved or cleared by the [place] FDA and  has been authorized for detection and/or diagnosis of SARS-CoV-2 by  FDA under an Emergency Use Authorization (EUA). This EUA will remain  in effect (meaning this test can be used) for the duration of the  Covid-19 declaration under Section 564(b)(1) of the Act, 21  U.S.C. section 360bbb-3(b)(1), unless the authorization is  terminated or revoked. Performed at [facility], [address] [place], [place] [address]       Studies: DG Chest Portable 1 View  Result Date: 04/14/2020 CLINICAL DATA:  Shortness of breath. Chronic cough. COPD. Weakness. EXAM: PORTABLE CHEST 1 VIEW COMPARISON:  03/30/2020 FINDINGS: Indistinct pulmonary vasculature with prominence of upper zone vessels suspicious for pulmonary venous hypertension. Mild cardiomegaly is present. No overt edema or discrete airspace opacity. Atherosclerotic calcification of the aortic arch. No blunting of the costophrenic angles. Mild biapical pleuroparenchymal scarring. IMPRESSION: 1. Mild cardiomegaly with pulmonary venous hypertension. Electronically Signed   By: Gaylyn Rong M.D.   On: 04/14/2020 13:06    Scheduled Meds: . ALPRAZolam   0.25 mg Oral QHS  . aspirin  81 mg Oral Daily  . azithromycin  500 mg Oral Daily  . cholecalciferol  5,000 Units Oral Daily  . fluticasone furoate-vilanterol  1 puff Inhalation Daily  . gabapentin  100 mg Oral QHS  . insulin aspart  0-5 Units Subcutaneous QHS  . insulin aspart  0-9 Units Subcutaneous TID WC  . isosorbide mononitrate  30 mg Oral Daily  . levalbuterol  0.63 mg Nebulization Q8H  . levothyroxine  50 mcg Oral Q0600  . metoprolol succinate  100 mg Oral Q lunch  . pantoprazole (PROTONIX) IV  40  mg Intravenous Q24H    Continuous Infusions:   LOS: 1 day     Briant Cedar, MD [facility]  If 7PM-7AM, please contact night-coverage [email or web address] 04/15/2020, 12:24 PM

## 2020-04-15 NOTE — Progress Notes (Signed)
[facility] Cardiology    SUBJECTIVE: Patient states she feels reasonably well no chest pain no palpitations no tachycardia she feels eager to go home and feels as well as she is felt in a while   Vitals:   04/14/20 1844 04/14/20 2000 04/15/20 0610 04/15/20 0834  BP: (!) 157/65 (!) 139/50 (!) 125/39 (!) 172/55  Pulse: 64 62 72 72  Resp: 18 18 16 20   Temp: 97.8 F (36.6 C) 98 F (36.7 C) 98.4 F (36.9 C) 98 F (36.7 C)  TempSrc: Oral Oral Oral Oral  SpO2: 100% 99% 100% 100%  Weight:   77.8 kg   Height:         Intake/Output Summary (Last 24 hours) at 04/15/2020 1140 Last data filed at 04/15/2020 0955 Gross per 24 hour  Intake 362.88 ml  Output --  Net 362.88 ml      PHYSICAL EXAM  General: Well developed, well nourished, in no acute distress HEENT:  Normocephalic and atramatic Neck:  No JVD.  Lungs: Clear bilaterally to auscultation and percussion. Heart: HRRR . Normal S1 and S2 without gallops or murmurs.  Abdomen: Bowel sounds are positive, abdomen soft and non-tender  Msk:  Back normal, normal gait. Normal strength and tone for age. Extremities: No clubbing, cyanosis or edema.   Neuro: Alert and oriented X 3. Psych:  Good affect, responds appropriately   LABS: Basic Metabolic Panel: Recent Labs    04/14/20 1154 04/15/20 0409  NA 137 136  K 4.0 4.2  CL 102 101  CO2 26 26  GLUCOSE 103* 184*  BUN 14 15  CREATININE 0.76 0.92  CALCIUM 9.0 9.0  MG  --  1.8  PHOS  --  3.7   Liver Function Tests: Recent Labs    04/14/20 1154 04/15/20 0409  AST 16 13*  ALT 15 12  ALKPHOS 48 41  BILITOT 0.6 0.6  PROT 6.5 5.9*  ALBUMIN 3.4* 3.1*   No results for input(s): LIPASE, AMYLASE in the last 72 hours. CBC: Recent Labs    04/14/20 1154 04/15/20 0409  WBC 8.7 9.5  NEUTROABS 6.3 6.9  HGB 10.2* 9.5*  HCT 33.8* 31.5*  MCV 85.8 84.9  PLT 273 272   Cardiac Enzymes: No results for input(s): CKTOTAL, CKMB, CKMBINDEX, TROPONINI in the last 72  hours. BNP: Invalid input(s): POCBNP D-Dimer: No results for input(s): DDIMER in the last 72 hours. Hemoglobin A1C: Recent Labs    04/14/20 1920  HGBA1C 7.0*   Fasting Lipid Panel: Recent Labs    04/15/20 0409  CHOL 189  HDL 35*  LDLCALC 91  TRIG 04/17/20*  CHOLHDL 5.4   Thyroid Function Tests: Recent Labs    04/14/20 1154  TSH 4.385   Anemia Panel: Recent Labs    04/14/20 1154  VITAMINB12 827  FOLATE 25.0  FERRITIN 14  TIBC 398  IRON 33  RETICCTPCT 1.9    DG Chest Portable 1 View  Result Date: 04/14/2020 CLINICAL DATA:  Shortness of breath. Chronic cough. COPD. Weakness. EXAM: PORTABLE CHEST 1 VIEW COMPARISON:  03/30/2020 FINDINGS: Indistinct pulmonary vasculature with prominence of upper zone vessels suspicious for pulmonary venous hypertension. Mild cardiomegaly is present. No overt edema or discrete airspace opacity. Atherosclerotic calcification of the aortic arch. No blunting of the costophrenic angles. Mild biapical pleuroparenchymal scarring. IMPRESSION: 1. Mild cardiomegaly with pulmonary venous hypertension. Electronically Signed   By: 04/01/2020 M.D.   On: 04/14/2020 13:06     Ec  TELEMETRY: Normal sinus rhythm 75  nonspecific ST-T wave changes  ASSESSMENT AND PLAN:  Active Problems:   Adult hypothyroidism   Essential (primary) hypertension   Acid reflux   Restless leg   Diabetes mellitus, type 2 (HCC)   CAD (coronary artery disease)   Atrial fibrillation with RVR (HCC)   Chronic respiratory failure with hypoxia (HCC)   Melena     Plan Since patient is converted back to sinus rhythm blood pressure and heart rate are well controlled with consider beta-blockade low-dose diltiazem and probable consider discharge home with follow-up with cardiology as an outpatient Patient appears to be poor anticoagulation candidate because of GI bleeding and anemia Atrial fibrillation is paroxysmal and recurrent but now appears to be in sinus  rhythm Known coronary disease appears to be reasonably stable Diabetes appears to be reasonably managed From a cardiac standpoint no further intervention is necessary and will be happy to follow-up with her as an outpatient   Alwyn Pea, MD 04/15/2020 11:40 AM

## 2020-04-16 DIAGNOSIS — E119 Type 2 diabetes mellitus without complications: Secondary | ICD-10-CM | POA: Diagnosis not present

## 2020-04-16 DIAGNOSIS — E039 Hypothyroidism, unspecified: Secondary | ICD-10-CM | POA: Diagnosis not present

## 2020-04-16 DIAGNOSIS — J9611 Chronic respiratory failure with hypoxia: Secondary | ICD-10-CM | POA: Diagnosis not present

## 2020-04-16 DIAGNOSIS — I4891 Unspecified atrial fibrillation: Secondary | ICD-10-CM | POA: Diagnosis not present

## 2020-04-16 LAB — CBC WITH DIFFERENTIAL/PLATELET
Abs Immature Granulocytes: 0.05 10*3/uL (ref 0.00–0.07)
Basophils Absolute: 0 10*3/uL (ref 0.0–0.1)
Basophils Relative: 1 %
Eosinophils Absolute: 0.4 10*3/uL (ref 0.0–0.5)
Eosinophils Relative: 4 %
HCT: 32.1 % — ABNORMAL LOW (ref 36.0–46.0)
Hemoglobin: 9.8 g/dL — ABNORMAL LOW (ref 12.0–15.0)
Immature Granulocytes: 1 %
Lymphocytes Relative: 18 %
Lymphs Abs: 1.5 10*3/uL (ref 0.7–4.0)
MCH: 25.8 pg — ABNORMAL LOW (ref 26.0–34.0)
MCHC: 30.5 g/dL (ref 30.0–36.0)
MCV: 84.5 fL (ref 80.0–100.0)
Monocytes Absolute: 0.5 10*3/uL (ref 0.1–1.0)
Monocytes Relative: 6 %
Neutro Abs: 6.2 10*3/uL (ref 1.7–7.7)
Neutrophils Relative %: 70 %
Platelets: 256 10*3/uL (ref 150–400)
RBC: 3.8 MIL/uL — ABNORMAL LOW (ref 3.87–5.11)
RDW: 14.6 % (ref 11.5–15.5)
WBC: 8.7 10*3/uL (ref 4.0–10.5)
nRBC: 0 % (ref 0.0–0.2)

## 2020-04-16 LAB — GLUCOSE, CAPILLARY
Glucose-Capillary: 200 mg/dL — ABNORMAL HIGH (ref 70–99)
Glucose-Capillary: 273 mg/dL — ABNORMAL HIGH (ref 70–99)
Glucose-Capillary: 168 mg/dL — ABNORMAL HIGH (ref 70–99)
Glucose-Capillary: 226 mg/dL — ABNORMAL HIGH (ref 70–99)

## 2020-04-16 LAB — BASIC METABOLIC PANEL
Anion gap: 10 (ref 5–15)
BUN: 17 mg/dL (ref 8–23)
CO2: 26 mmol/L (ref 22–32)
Calcium: 8.9 mg/dL (ref 8.9–10.3)
Chloride: 102 mmol/L (ref 98–111)
Creatinine, Ser: 0.89 mg/dL (ref 0.44–1.00)
GFR, Estimated: >60 mL/min (ref >60)
Glucose, Bld: 164 mg/dL — ABNORMAL HIGH (ref 70–99)
Potassium: 4.1 mmol/L (ref 3.5–5.1)
Sodium: 138 mmol/L (ref 135–145)

## 2020-04-16 MED ORDER — DOXYCYCLINE HYCLATE 100 MG PO TABS
100.0000 mg | ORAL_TABLET | Freq: Two times a day (BID) | ORAL | Status: DC
  Administered 2020-04-16 – 2020-04-17 (×3): 100 mg via ORAL
  Filled 2020-04-16 (×3): qty 1

## 2020-04-16 MED ORDER — DIGOXIN 0.25 MG/ML IJ SOLN
0.2500 mg | Freq: Once | INTRAMUSCULAR | Status: AC
  Administered 2020-04-16: 0.25 mg via INTRAVENOUS
  Filled 2020-04-16: qty 2

## 2020-04-16 MED ORDER — PANTOPRAZOLE SODIUM 40 MG PO TBEC
40.0000 mg | DELAYED_RELEASE_TABLET | Freq: Every day | ORAL | Status: DC
  Administered 2020-04-16 – 2020-04-17 (×2): 40 mg via ORAL
  Filled 2020-04-16 (×2): qty 1

## 2020-04-16 MED ORDER — TRAMADOL HCL 50 MG PO TABS
50.0000 mg | ORAL_TABLET | Freq: Two times a day (BID) | ORAL | Status: DC | PRN
  Filled 2020-04-16: qty 1

## 2020-04-16 MED ORDER — TRAZODONE HCL 50 MG PO TABS
50.0000 mg | ORAL_TABLET | Freq: Every day | ORAL | Status: DC
  Administered 2020-04-16: 50 mg via ORAL
  Filled 2020-04-16: qty 1

## 2020-04-16 MED ORDER — INSULIN ASPART 100 UNIT/ML ~~LOC~~ SOLN
0.0000 [IU] | Freq: Three times a day (TID) | SUBCUTANEOUS | Status: DC
  Administered 2020-04-16: 3 [IU] via SUBCUTANEOUS
  Administered 2020-04-17: 8 [IU] via SUBCUTANEOUS
  Administered 2020-04-17: 5 [IU] via SUBCUTANEOUS
  Filled 2020-04-16 (×3): qty 1

## 2020-04-16 NOTE — Evaluation (Signed)
Occupational Therapy Evaluation Patient Details Name: Peggy [name] MRN: [id number] DOB: Apr 05, 1929 Today's Date: 04/16/2020    History of Present Illness Peggy [name] is a 86 y.o. female with medical history significant of a.fib rvr, htn, COPD oxygen dependent 4L, DM, CAD and h/o GIBleed. Pt sees cardiology Dr. Darrold Junker. Doster stools for week and half. BP was low and son decided to bring her to hospital. Pt initially went to walk in clinic and was referred here. Note to be in Afib with RVR, with hypotension. Last admission she was started on eliquis and she stopped it because of epistaxis. Patient admitted for further management.   Clinical Impression   Pt seen for OT evaluation this date. Pt was modified independent in all basic ADL (increased time, some difficulty with LB ADL with L foot but generally did not need assist), ambulating primarily with SPC vs rollator vs "furniture walking" depending on how the pt was feeling, and receiving assist from daughter for meals, transportation, and medication mgt and paying someone to clean the home. Pt lives with her daughter who works during the day but she has another son and daughter who are with her daily except for Friday. Pt lives in a 1 story home with 3 steps, L rail, and tub/shower with shower chair (but hasn't been using). Pt reporting being on 4 liters of O2 at home (has a pulse oximeter) but that "my doctor told me I could be off of it sometimes."  Pt reports becoming more easily fatigued or out of breath with minimal exertion. Due to current functional impairments (See OT Problem List below), pt now requires Supervision to [facility] for LB ADL tasks and ADL mobility as well as cues for rest breaks/safety. Pt demonstrated ability to perform bed mobility with modified independence, ambulating with handheld assist and reaching out for walls/counter tops with other hand. Remote supervision for toileting except for SBA-CGA for toilet transfer. Pt instructed in  energy conservation strategies including pursed lip breathing, activity pacing, home/routines modifications, work simplification, AE/DME, prioritizing of meaningful occupations, bladder mgt and falls prevention. Pt verbalized understanding and would benefit from additional skilled OT services to maximize recall and carryover of learned techniques and facilitate implementation of learned techniques into daily routines. Upon discharge, recommend HHOT services, however, pt politely declines at this time.      Follow Up Recommendations  No OT follow up;Supervision - Intermittent    Equipment Recommendations  Other (comment) (handheld shower head suction holder for shower, long handled sponge)    Recommendations for Other Services       Precautions / Restrictions Precautions Precautions: Fall Restrictions Weight Bearing Restrictions: No      Mobility Bed Mobility Overal bed mobility: Modified Independent                  Transfers Overall transfer level: Needs assistance Equipment used: None Transfers: Sit to/from Stand Sit to Stand: Supervision              Balance Overall balance assessment: Needs assistance Sitting-balance support: Feet supported;No upper extremity supported Sitting balance-Leahy Scale: Good     Standing balance support: During functional activity;Single extremity supported Standing balance-Leahy Scale: Fair Standing balance comment: handheld assist with ADL mobility to/from bathroom, pt reaching out for walls/counter tops for additional stability                           ADL either performed or assessed with clinical judgement  ADL Overall ADL's : Needs assistance/impaired     Grooming: Wash/dry hands;Standing;Supervision/safety               Lower Body Dressing: Sit to/from stand;Set up;Supervision/safety Lower Body Dressing Details (indicate cue type and reason): increased effort/time to perform Toilet Transfer:  Ambulation;Comfort height toilet;Min guard;Grab bars   Toileting- Clothing Manipulation and Hygiene: Modified independent               Vision Baseline Vision/History: Wears glasses Wears Glasses: Reading only Patient Visual Report: No change from baseline       Perception     Praxis      Pertinent Vitals/Pain Pain Assessment: No/denies pain     Hand Dominance Right   Extremity/Trunk Assessment Upper Extremity Assessment Upper Extremity Assessment: Generalized weakness   Lower Extremity Assessment Lower Extremity Assessment: Generalized weakness   Cervical / Trunk Assessment Cervical / Trunk Assessment: Normal   Communication Communication Communication: HOH   Cognition Arousal/Alertness: Awake/alert Behavior During Therapy: WFL for tasks assessed/performed Overall Cognitive Status: Within Functional Limits for tasks assessed                                     General Comments  Pt on 3L O2 at start of session. Removed for ADL task. Ambulated to/from bathroom for toileting and sat EOB for approx 15 min on room air with SpO2 96% or greater, HR into low 100's with exertion.    Exercises Other Exercises Other Exercises: Pt instructed in energy conservation strategies including activity pacing, home/routines modifications, AE/DME, work simplification, pursed lip breathing, and importance of rest breaks and recovery Other Exercises: Pt instructed in falls prevention strategies and bladder mgt strategies to minimize risk of rushing and bladder leaks when attempting to get to the bathroom in time   Shoulder Instructions      Home Living Family/patient expects to be discharged to:: Private residence Living Arrangements: Children (daughter) Available Help at Discharge: Family;Available PRN/intermittently Type of Home: House Home Access: Stairs to enter [employer] of Steps: 3 Entrance Stairs-Rails: Right Home Layout: One level      Bathroom Shower/Tub: Chief Strategy Officer: Handicapped height Bathroom Accessibility: Yes   Home Equipment: Walker - 4 wheels;Grab bars - tub/shower;Cane - single point;Grab bars - toilet;Bedside commode;Adaptive equipment;Hand held shower head Adaptive Equipment: Reacher;Sock aid Additional Comments: Pt lives with adult daughter who works during the day but another family members (dtr, son) comes to stay with pt every day through the week except Friday. Pt doesn't cook/clean but reports she can "do for" herself on Friday. Dtr drives and manages meds.      Prior Functioning/Environment Level of Independence: Independent with assistive device(s)        Comments: Reports using SPC at home, but will use rollator in the mornings when she feels "weaker".        OT Problem List: Decreased strength;Decreased activity tolerance;Decreased safety awareness;Decreased knowledge of use of DME or AE;Impaired balance (sitting and/or standing);Cardiopulmonary status limiting activity      OT Treatment/Interventions: Self-care/ADL training;Therapeutic exercise;Therapeutic activities;Energy conservation;Patient/family education;DME and/or AE instruction;Balance training    OT Goals(Current goals can be found in the care plan section) Acute Rehab OT Goals Patient Stated Goal: to go home OT Goal Formulation: With patient Time For Goal Achievement: 04/30/20 Potential to Achieve Goals: Good ADL Goals Pt Will Perform Lower Body Dressing: with modified independence;with adaptive equipment;sit  to/from stand (using learned ECS) Pt Will Transfer to Toilet: with modified independence;ambulating (comfort height toilet, LRAD for amb, ECS) Additional ADL Goal #1: Pt will perform shower using shower chair and grab bar when standing briefly, handheld shower head, to complete with remote supervision and using learned ECS to maximize safety. Additional ADL Goal #2: Pt will verbalize plan to implement  at least 1 learned bladder mgt strategy to maximize safety/indep and minimize risk of falls and bladder leaks.  OT Frequency: Min 2X/week   Barriers to D/C:            Co-evaluation              AM-PAC OT "6 Clicks" Daily Activity     Outcome Measure Help from another person eating meals?: None Help from another person taking care of personal grooming?: None Help from another person toileting, which includes using toliet, bedpan, or urinal?: A Little Help from another person bathing (including washing, rinsing, drying)?: A Little Help from another person to put on and taking off regular upper body clothing?: None Help from another person to put on and taking off regular lower body clothing?: A Little 6 Click Score: 21   End of Session Equipment Utilized During Treatment: Gait belt;Rolling walker Nurse Communication: Other (comment) (toileting, SpO2 during ADL tasks)  Activity Tolerance: Patient tolerated treatment well Patient left: in bed;with call bell/phone within reach;with bed alarm set  OT Visit Diagnosis: Unsteadiness on feet (R26.81);Muscle weakness (generalized) (M62.81)                Time: 0623-7628 OT Time Calculation (min): 43 min Charges:  OT General Charges $OT Visit: 1 Visit OT Evaluation $OT Eval Moderate Complexity: 1 Mod OT Treatments $Self Care/Home Management : 23-37 mins  Richrd Prime, MPH, MS, OTR/L ascom [phone] 04/16/20, 10:13 AM

## 2020-04-16 NOTE — Progress Notes (Signed)
PROGRESS NOTE  TEMPESTT SILBA OBS:[id number] DOB: 1929-03-31 DOA: 04/14/2020 PCP: Lauro Regulus, MD  HPI/Recap of past 24 hours: HPI from Dr Olam Idler is a 87 y.o. female with medical history significant of a.fib rvr, htn, COPD oxygen dependent 4L, DM, CAD and h/o GIBleed. Pt sees cardiology Dr. Darrold Junker. Ezekiel stools for week and half. BP was low and son decided to bring her to hospital. Pt initially went to walk in clinic and was referred here. Note to be in Afib with RVR, with hypotension. Last admission she was started on eliquis and she stopped it because of epistaxis. Patient admitted for further management.     Overnight and this a.m., patient continues to flipped back in A. fib with RVR, symptomatic with palpitations, denies any chest pain, abdominal pain, nausea/vomiting, fever/chills.  Notes intermittent chronic right ankle pain.  Reports poor sleep overnight.    Assessment/Plan: Active Problems:   Adult hypothyroidism   Essential (primary) hypertension   Acid reflux   Restless leg   Diabetes mellitus, type 2 (HCC)   CAD (coronary artery disease)   Atrial fibrillation with RVR (HCC)   Chronic respiratory failure with hypoxia (HCC)   Melena   Paroxysmal A. fib with RVR Patient keeps flipping into A. fib with RVR, symptomatic with palpitations S/p diltiazem drip, IV digoxin Echo showed EF of 55 to 60%, no regional wall motion abnormality Continue home metoprolol, aspirin, and now amiodarone Cardiology consulted, noted to be a poor candidate for [facility], started amiodarone Telemetry  ? Melena Normocytic anemia Possibly GI bleed, but melena could occur with nosebleeds Hemoglobin remained stable Anemia panel showed iron 33, sats 8, ferritin 14 Continue IV PPI, hold off Eliquis GI consulted-no further recommendation Daily CBC  Elevated troponin History of CAD Chest pain-free Troponin elevated, with a flat trend EKG with no acute ST changes Likely 2/2  demand ischemia Continue Lipitor, metoprolol, aspirin, Imdur Cardiology consulted  Hypertension BP stable Continue metoprolol  History of COPD Chronic respiratory failure with hypoxia Continue supplemental home O2 at baseline 4 L Due to addition of amiodarone, home azithromycin was switched to doxycycline, duo nebs  Diabetes mellitus type 2 with hyperglycemia Last A1c 7 SSI, Accu-Cheks, hypoglycemic protocol  Hypothyroidism Continue Synthroid  Obesity Lifestyle modification advised        Malnutrition Type:      Malnutrition Characteristics:      Nutrition Interventions:       Estimated body mass index is 30.93 kg/m as calculated from the following:   Height as of this encounter: 5\' 2"  (1.575 m).   Weight as of this encounter: 76.7 kg.     Code Status: DNR  Family Communication: Plan to discuss with family  Disposition Plan: Status is: Inpatient  Remains inpatient appropriate because:Inpatient level of care appropriate due to severity of illness   Dispo: The patient is from: Home              Anticipated d/c is to: Home              Anticipated d/c date is: 1 day              Patient currently is not medically stable to d/c.     Consultants:  Cardiology  Procedures:  None  Antimicrobials:  Azithromycin--> doxycycline  DVT prophylaxis: SCDs   Objective: Vitals:   04/16/20 1227 04/16/20 1325 04/16/20 1538 04/16/20 1546  BP: 139/76  (!) 129/54   Pulse: (!) 125 04/18/20)  101 75   Resp: 19  19   Temp: 98 F (36.7 C)  97.6 F (36.4 C)   TempSrc:   Oral   SpO2: 98% 98% 97% 97%  Weight:      Height:        Intake/Output Summary (Last 24 hours) at 04/16/2020 1603 Last data filed at 04/16/2020 1358 Gross per 24 hour  Intake 720 ml  Output 600 ml  Net 120 ml   Filed Weights   04/14/20 1138 04/15/20 0610 04/16/20 0419  Weight: 80.5 kg 77.8 kg 76.7 kg    Exam:  General: NAD  Cardiovascular: S1, S2 present  Respiratory:   Diminished breath sounds bilaterally  Abdomen: Soft, nontender, nondistended, bowel sounds present  Musculoskeletal: No bilateral pedal edema noted  Skin: Normal  Psychiatry: Normal mood    Data Reviewed: CBC: Recent Labs  Lab 04/14/20 1154 04/15/20 0409 04/16/20 0417  WBC 8.7 9.5 8.7  NEUTROABS 6.3 6.9 6.2  HGB 10.2* 9.5* 9.8*  HCT 33.8* 31.5* 32.1*  MCV 85.8 84.9 84.5  PLT 273 272 256   Basic Metabolic Panel: Recent Labs  Lab 04/14/20 1154 04/15/20 0409 04/16/20 0417  NA 137 136 138  K 4.0 4.2 4.1  CL 102 101 102  CO2 26 26 26   GLUCOSE 103* 184* 164*  BUN 14 15 17   CREATININE 0.76 0.92 0.89  CALCIUM 9.0 9.0 8.9  MG  --  1.8  --   PHOS  --  3.7  --    GFR: Estimated Creatinine Clearance: 41.1 mL/min (by C-G formula based on SCr of 0.89 mg/dL). Liver Function Tests: Recent Labs  Lab 04/14/20 1154 04/15/20 0409  AST 16 13*  ALT 15 12  ALKPHOS 48 41  BILITOT 0.6 0.6  PROT 6.5 5.9*  ALBUMIN 3.4* 3.1*   No results for input(s): LIPASE, AMYLASE in the last 168 hours. No results for input(s): AMMONIA in the last 168 hours. Coagulation Profile: Recent Labs  Lab 04/14/20 1154  INR 1.1   Cardiac Enzymes: No results for input(s): CKTOTAL, CKMB, CKMBINDEX, TROPONINI in the last 168 hours. BNP (last 3 results) No results for input(s): PROBNP in the last 8760 hours. HbA1C: Recent Labs    04/14/20 1920  HGBA1C 7.0*   CBG: Recent Labs  Lab 04/15/20 1149 04/15/20 1708 04/15/20 2126 04/16/20 0815 04/16/20 1228  GLUCAP 217* 156* 225* 200* 273*   Lipid Profile: Recent Labs    04/15/20 0409  CHOL 189  HDL 35*  LDLCALC 91  TRIG 277*  CHOLHDL 5.4   Thyroid Function Tests: Recent Labs    04/14/20 1154  TSH 4.385  FREET4 0.80   Anemia Panel: Recent Labs    04/14/20 1154  VITAMINB12 827  FOLATE 25.0  FERRITIN 14  TIBC 398  IRON 33  RETICCTPCT 1.9   Urine analysis:    Component Value Date/Time   COLORURINE STRAW (A) 03/30/2020  2030   APPEARANCEUR CLEAR (A) 03/30/2020 2030   APPEARANCEUR Hazy 08/11/2012 1201   LABSPEC 1.011 03/30/2020 2030   LABSPEC 1.020 08/11/2012 1201   PHURINE 6.0 03/30/2020 2030   GLUCOSEU >=500 (A) 03/30/2020 2030   GLUCOSEU Negative 08/11/2012 1201   HGBUR NEGATIVE 03/30/2020 2030   BILIRUBINUR NEGATIVE 03/30/2020 2030   BILIRUBINUR neg 02/11/2015 0950   BILIRUBINUR Negative 08/11/2012 1201   KETONESUR NEGATIVE 03/30/2020 2030   PROTEINUR NEGATIVE 03/30/2020 2030   UROBILINOGEN 0.2 02/11/2015 0950   NITRITE NEGATIVE 03/30/2020 2030   LEUKOCYTESUR NEGATIVE 03/30/2020 2030  LEUKOCYTESUR 1+ 08/11/2012 1201   Sepsis Labs: @LABRCNTIP (procalcitonin:4,lacticidven:4)  ) Recent Results (from the past 240 hour(s))  Respiratory Panel by RT PCR (Flu A&B, Covid) - Nasopharyngeal Swab     Status: None   Collection Time: 04/14/20 11:55 AM   Specimen: Nasopharyngeal Swab  Result Value Ref Range Status   SARS Coronavirus 2 by RT PCR NEGATIVE NEGATIVE Final    Comment: (NOTE) SARS-CoV-2 target nucleic acids are NOT DETECTED.  The SARS-CoV-2 RNA is generally detectable in upper respiratoy specimens during the acute phase of infection. The lowest concentration of SARS-CoV-2 viral copies this assay can detect is 131 copies/mL. A negative result does not preclude SARS-Cov-2 infection and should not be used as the sole basis for treatment or other patient management decisions. A negative result may occur with  improper specimen collection/handling, submission of specimen other than nasopharyngeal swab, presence of viral mutation(s) within the areas targeted by this assay, and inadequate number of viral copies (<131 copies/mL). A negative result must be combined with clinical observations, patient history, and epidemiological information. The expected result is Negative.  Fact Sheet for Patients:  04/16/20  Fact Sheet for Healthcare Providers:   https://www.moore.com/  This test is no t yet approved or cleared by the https://www.young.biz/ FDA and  has been authorized for detection and/or diagnosis of SARS-CoV-2 by FDA under an Emergency Use Authorization (EUA). This EUA will remain  in effect (meaning this test can be used) for the duration of the COVID-19 declaration under Section 564(b)(1) of the Act, 21 U.S.C. section 360bbb-3(b)(1), unless the authorization is terminated or revoked sooner.     Influenza A by PCR NEGATIVE NEGATIVE Final   Influenza B by PCR NEGATIVE NEGATIVE Final    Comment: (NOTE) The Xpert Xpress SARS-CoV-2/FLU/RSV assay is intended as an aid in  the diagnosis of influenza from Nasopharyngeal swab specimens and  should not be used as a sole basis for treatment. Nasal washings and  aspirates are unacceptable for Xpert Xpress SARS-CoV-2/FLU/RSV  testing.  Fact Sheet for Patients: [place]  Fact Sheet for Healthcare Providers: https://www.moore.com/  This test is not yet approved or cleared by the https://www.young.biz/ FDA and  has been authorized for detection and/or diagnosis of SARS-CoV-2 by  FDA under an Emergency Use Authorization (EUA). This EUA will remain  in effect (meaning this test can be used) for the duration of the  Covid-19 declaration under Section 564(b)(1) of the Act, 21  U.S.C. section 360bbb-3(b)(1), unless the authorization is  terminated or revoked. Performed at [facility], [address], [place], [place]       Studies: No results found.  Scheduled Meds: . ALPRAZolam  0.25 mg Oral QHS  . amiodarone  200 mg Oral BID  . aspirin  81 mg Oral Daily  . cholecalciferol  5,000 Units Oral Daily  . doxycycline  100 mg Oral Q12H  . fluticasone furoate-vilanterol  1 puff Inhalation Daily  . gabapentin  100 mg Oral QHS  . insulin aspart  0-5 Units Subcutaneous QHS  . insulin aspart  0-9 Units  Subcutaneous TID WC  . isosorbide mononitrate  30 mg Oral Daily  . levalbuterol  0.63 mg Nebulization Q8H  . levothyroxine  50 mcg Oral Q0600  . metoprolol succinate  100 mg Oral Q lunch  . pantoprazole  40 mg Oral Daily  . traZODone  50 mg Oral QHS    Continuous Infusions:   LOS: 2 days     [id number]  Sharolyn Douglas, MD [facility]  If 7PM-7AM, please contact night-coverage [email or web address] 04/16/2020, 4:03 PM

## 2020-04-16 NOTE — Progress Notes (Addendum)
CCMD called and reported that pt converted to Afib HR sustaining at 140. Notify Ouma NP. Will continue to monitor.  Update 0650: NP ordered digoxin 0.25 mg IV once. Will continue to monitor.  Update 0655: IV digoxin 0.25 mg once. Will continue to monitor.

## 2020-04-16 NOTE — TOC Initial Note (Signed)
Transition of Care [facility]) - Initial/Assessment Note    Patient Details  Name: Peggy [name] MRN: [id number] Date of Birth: [birth year]  Transition of Care [facility]) CM/SW Contact:    Kerin Salen, RN Phone Number: 04/16/2020, 10:45 AM  Clinical Narrative:  CM met with patient in room. Patient alert and oriented X4, lives with daughter in [place]. Daughter provides support with obtaining medication from [facility] in [place], provides shopping and prepare meals. Patient states she uses a cane at times when she feels weakness in lower legs. Uses oxygen 3l NP at night services by [employer]. PCP, Dr. Ouida Sills at [facility], daughter attends appointments with patient.            Expected Discharge Plan: Home/Self Care (Lives with Daughter) Barriers to Discharge: Continued Medical Work up   Patient Goals and CMS Choice Patient states their goals for this hospitalization and ongoing recovery are:: Home with Daughtert      Expected Discharge Plan and Services Expected Discharge Plan: Home/Self Care (Lives with Daughter)       Living arrangements for the past 2 months: Single Family Home (Lives with Family)                                      Prior Living Arrangements/Services Living arrangements for the past 2 months: Single Family Home (Lives with Family)   Patient language and need for interpreter reviewed:: Yes Do you feel safe going back to the place where you live?: Yes      Need for Family Participation in Patient Care: Yes (Comment) Care giver support system in place?: Yes (comment)   Criminal Activity/Legal Involvement Pertinent to Current Situation/Hospitalization: No - Comment as needed  Activities of Daily Living Home Assistive Devices/Equipment: CBG Meter, Cane (specify quad or straight), Dentures (specify type), Eyeglasses (cane with no prongs; top) ADL Screening (condition at time of admission) Patient's cognitive ability adequate to safely complete daily  activities?: Yes Is the patient deaf or have difficulty hearing?: Yes Does the patient have difficulty seeing, even when wearing glasses/contacts?: No Does the patient have difficulty concentrating, remembering, or making decisions?: No Patient able to express need for assistance with ADLs?: Yes Does the patient have difficulty dressing or bathing?: No Independently performs ADLs?: Yes (appropriate for developmental age) Does the patient have difficulty walking or climbing stairs?: Yes Weakness of Legs: Both Weakness of Arms/Hands: None  Permission Sought/Granted Permission sought to share information with : Customer service manager, Family Supports (Daughter)                Emotional Assessment Appearance:: Appears stated age, Well-Groomed Attitude/Demeanor/Rapport: Self-Confident, Engaged Affect (typically observed): Appropriate, Accepting, Calm Orientation: : Oriented to Self, Oriented to Place, Oriented to  Time, Oriented to Situation Alcohol / Substance Use: Not Applicable Psych Involvement: No (comment)  Admission diagnosis:  Atrial fibrillation with rapid ventricular response (HCC) [I48.91] Atrial fibrillation with RVR ([place]) [I48.91] Patient Active Problem List   Diagnosis Date Noted  . Atrial fibrillation with rapid ventricular response ([place]) 04/14/2020  . Melena 04/14/2020  . COPD with acute exacerbation ([place]) 03/30/2020  . Elevated troponin level 03/30/2020  . Leukocytosis 03/30/2020  . COPD (chronic obstructive pulmonary disease) ([place]) 03/22/2020  . Uncontrolled hypertension 11/22/2018  . Chronic respiratory failure with hypoxia ([place]) 10/08/2017  . Simple chronic bronchitis ([place]) 10/08/2017  . SOB (shortness of breath) 09/24/2017  . History of stroke  08/17/2017  . Acute respiratory failure with hypoxia and hypercarbia (HCC) 08/07/2017  . Respiratory failure (HCC) 08/07/2017  . History of GI bleed 07/07/2017  . Atrial fibrillation with RVR (HCC)  06/30/2017  . Bilateral carotid artery disease (HCC) 04/14/2017  . Healthcare maintenance 04/14/2017  . Dizziness 04/07/2017  . 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 TIA (transient ischemic attack) 12/10/2016  . CVA (cerebral vascular accident) (HCC) 12/09/2016  . Bilateral arm pain 11/27/2016  . Myocardial infarction (HCC) 07/30/2015  . Urinary frequency 02/09/2015  . RUQ abdominal pain 01/26/2015  . Shortness of breath 01/26/2015  . Anxiety 01/26/2015  . Chronic pruritus 12/21/2014  . Cerebral vascular accident (HCC) 11/16/2014  . Chronic low back pain 11/16/2014  . Adult hypothyroidism 11/15/2014  . Allergic rhinitis 11/15/2014  . Body mass index (BMI) of 28.0-28.9 in adult 11/15/2014  . Arthritis 11/15/2014  . Cramps of lower extremity 11/15/2014  . Essential (primary) hypertension 11/15/2014  . Acid reflux 11/15/2014  . Hypercholesteremia 11/15/2014  . Cannot sleep 11/15/2014  . Psoriasis 11/15/2014  . Itch of skin 11/15/2014  . Restless leg 11/15/2014  . Diabetes mellitus, type 2 (HCC) 11/15/2014  . Breath shortness 11/15/2014  . Dermatitis due to unknown cause 04/10/2014  . CAD (coronary artery disease) 02/20/2014  . AF (paroxysmal atrial fibrillation) (HCC) 02/20/2014  . Temporary cerebral vascular dysfunction 02/20/2014  . DD (diverticular disease) 10/05/2013   PCP:  Anderson, Marshall W, MD Pharmacy:   [id number] - [place], [place] - [address] [place] [address] Phone: [phone] Fax: [phone]  [id number] - [place], [place] - [address] [place] [address] Phone: [phone] Fax: [phone]  [id number] - [place], [place] - [address] [place] [address] Phone: [phone] Fax: [phone]     Social Determinants of  Health (SDOH) Interventions    Readmission Risk Interventions Readmission Risk Prevention Plan 04/02/2020 11/24/2018  Transportation Screening Complete -  PCP or Specialist Appt within 5-7 Days - Complete  Home Care Screening - Complete  Medication Review (RN CM) - Complete  HRI or Home Care Consult Complete -  Palliative Care Screening Not Applicable -  Medication Review (RN Care Manager) Complete -  Some recent data might be hidden    

## 2020-04-16 NOTE — Plan of Care (Signed)
  Problem: Education: Goal: Knowledge of General Education information will improve Description Including pain rating scale, medication(s)/side effects and non-pharmacologic comfort measures Outcome: Progressing   Problem: Clinical Measurements: Goal: Ability to maintain clinical measurements within normal limits will improve Outcome: Progressing Goal: Respiratory complications will improve Outcome: Progressing   Problem: Pain Managment: Goal: General experience of comfort will improve Outcome: Progressing   Problem: Safety: Goal: Ability to remain free from injury will improve Outcome: Progressing   

## 2020-04-16 NOTE — Progress Notes (Signed)
Physical Therapy Treatment Patient Details Name: Peggy [name] MRN: [id number] DOB: Nov 10, 1928 Today's Date: 04/16/2020    History of Present Illness Peggy [name] is a 86 y.o. female with medical history significant of a.fib rvr, htn, COPD oxygen dependent 4L, DM, CAD and h/o GIBleed. Pt sees cardiology Dr. Darrold Junker. Pillay stools for week and half. BP was low and son decided to bring her to hospital. Pt initially went to walk in clinic and was referred here. Note to be in Afib with RVR, with hypotension. Last admission she was started on eliquis and she stopped it because of epistaxis. Patient admitted for further management.    PT Comments    HR and O2 stable on room air.  Discussed with RN and OK for session and to leave O2 off.  She is able to get OOB and walk unit x 1 with SPC and generally steady gait.  Pt stated she is on 3-4 LPM at home.  Encouraged her to discuss with MD on further recommendations.    Follow Up Recommendations  Outpatient PT;Supervision for mobility/OOB     Equipment Recommendations  None recommended by PT    Recommendations for Other Services       Precautions / Restrictions Precautions Precautions: Fall Restrictions Weight Bearing Restrictions: No    Mobility  Bed Mobility Overal bed mobility: Modified Independent                Transfers Overall transfer level: Modified independent Equipment used: None Transfers: Sit to/from Stand Sit to Stand: Supervision            Ambulation/Gait Ambulation/Gait assistance: Supervision Gait Distance (Feet): 180 Feet Assistive device: None;Straight cane       General Gait Details: generally steady with [facility]   Stairs             Wheelchair Mobility    Modified Rankin (Stroke Patients Only)       Balance Overall balance assessment: Needs assistance Sitting-balance support: Feet supported;No upper extremity supported Sitting balance-Leahy Scale: Good     Standing balance  support: During functional activity;Single extremity supported Standing balance-Leahy Scale: Fair Standing balance comment: handheld assist with ADL mobility to/from bathroom, pt reaching out for walls/counter tops for additional stability                            Cognition Arousal/Alertness: Awake/alert Behavior During Therapy: WFL for tasks assessed/performed Overall Cognitive Status: Within Functional Limits for tasks assessed                                        Exercises Other Exercises Other Exercises: Pt instructed in energy conservation strategies including activity pacing, home/routines modifications, AE/DME, work simplification, pursed lip breathing, and importance of rest breaks and recovery Other Exercises: Pt instructed in falls prevention strategies and bladder mgt strategies to minimize risk of rushing and bladder leaks when attempting to get to the bathroom in time    General Comments General comments (skin integrity, edema, etc.): Pt on 3L O2 at start of session. Removed for ADL task. Ambulated to/from bathroom for toileting and sat EOB for approx 15 min on room air with SpO2 96% or greater, HR into low 100's with exertion.      Pertinent Vitals/Pain Pain Assessment: No/denies pain    Home Living Family/patient expects to be discharged to::  Private residence Living Arrangements: Children (daughter) Available Help at Discharge: Family;Available PRN/intermittently Type of Home: House Home Access: Stairs to enter Entrance Stairs-Rails: Right Home Layout: One level Home Equipment: Walker - 4 wheels;Grab bars - tub/shower;Cane - single point;Grab bars - toilet;Bedside commode;Adaptive equipment;Hand held shower head Additional Comments: Pt lives with adult daughter who works during the day but another family members (dtr, son) comes to stay with pt every day through the week except Friday. Pt doesn't cook/clean but reports she can "do for"  herself on Friday. Dtr drives and manages meds.    Prior Function Level of Independence: Independent with assistive device(s)      Comments: Reports using SPC at home, but will use rollator in the mornings when she feels "weaker".   PT Goals (current goals can now be found in the care plan section) Acute Rehab PT Goals Patient Stated Goal: to go home Progress towards PT goals: Progressing toward goals    Frequency    Min 2X/week      PT Plan Current plan remains appropriate    Co-evaluation              AM-PAC PT "6 Clicks" Mobility   Outcome Measure  Help needed turning from your back to your side while in a flat bed without using bedrails?: None Help needed moving from lying on your back to sitting on the side of a flat bed without using bedrails?: None Help needed moving to and from a bed to a chair (including a wheelchair)?: None Help needed standing up from a chair using your arms (e.g., wheelchair or bedside chair)?: None Help needed to walk in hospital room?: A Little Help needed climbing 3-5 steps with a railing? : A Little 6 Click Score: 22    End of Session Equipment Utilized During Treatment: Gait belt Activity Tolerance: Patient tolerated treatment well Patient left: in chair;with call bell/phone within reach;with chair alarm set Nurse Communication: Mobility status       Time: 7824-2353 PT Time Calculation (min) (ACUTE ONLY): 12 min  Charges:  $Gait Training: 8-22 mins                    Danielle Dess, PTA 04/16/20, 1:10 PM

## 2020-04-16 NOTE — Progress Notes (Signed)
[facility] Cardiology    SUBJECTIVE: Patient feels much better feels well enough to go home she has had recurrent episodes of paroxysmal A. fib she is on amiodarone metoprolol and I recommended Cardizem as needed.  Right now she is in sinus rhythm and should be safe for discharge there is no further evidence of bleeding.   Vitals:   04/16/20 1227 04/16/20 1325 04/16/20 1538 04/16/20 1546  BP: 139/76  (!) 129/54   Pulse: (!) 125 (!) 101 75   Resp: 19  19   Temp: 98 F (36.7 C)  97.6 F (36.4 C)   TempSrc:   Oral   SpO2: 98% 98% 97% 97%  Weight:      Height:         Intake/Output Summary (Last 24 hours) at 04/16/2020 1605 Last data filed at 04/16/2020 1358 Gross per 24 hour  Intake 720 ml  Output 600 ml  Net 120 ml      PHYSICAL EXAM  General: Well developed, well nourished, in no acute distress HEENT:  Normocephalic and atramatic Neck:  No JVD.  Lungs: Clear bilaterally to auscultation and percussion. Heart: HRRR . Normal S1 and S2 without gallops or murmurs.  Abdomen: Bowel sounds are positive, abdomen soft and non-tender  Msk:  Back normal, normal gait. Normal strength and tone for age. Extremities: No clubbing, cyanosis or edema.   Neuro: Alert and oriented X 3. Psych:  Good affect, responds appropriately   LABS: Basic Metabolic Panel: Recent Labs    04/15/20 0409 04/16/20 0417  NA 136 138  K 4.2 4.1  CL 101 102  CO2 26 26  GLUCOSE 184* 164*  BUN 15 17  CREATININE 0.92 0.89  CALCIUM 9.0 8.9  MG 1.8  --   PHOS 3.7  --    Liver Function Tests: Recent Labs    04/14/20 1154 04/15/20 0409  AST 16 13*  ALT 15 12  ALKPHOS 48 41  BILITOT 0.6 0.6  PROT 6.5 5.9*  ALBUMIN 3.4* 3.1*   No results for input(s): LIPASE, AMYLASE in the last 72 hours. CBC: Recent Labs    04/15/20 0409 04/16/20 0417  WBC 9.5 8.7  NEUTROABS 6.9 6.2  HGB 9.5* 9.8*  HCT 31.5* 32.1*  MCV 84.9 84.5  PLT 272 256   Cardiac Enzymes: No results for input(s): CKTOTAL, CKMB,  CKMBINDEX, TROPONINI in the last 72 hours. BNP: Invalid input(s): POCBNP D-Dimer: No results for input(s): DDIMER in the last 72 hours. Hemoglobin A1C: Recent Labs    04/14/20 1920  HGBA1C 7.0*   Fasting Lipid Panel: Recent Labs    04/15/20 0409  CHOL 189  HDL 35*  LDLCALC 91  TRIG 160*  CHOLHDL 5.4   Thyroid Function Tests: Recent Labs    04/14/20 1154  TSH 4.385   Anemia Panel: Recent Labs    04/14/20 1154  VITAMINB12 827  FOLATE 25.0  FERRITIN 14  TIBC 398  IRON 33  RETICCTPCT 1.9    No results found.     TELEMETRY: Sinus rhythm nonspecific ST-T changes rate of 65 Patient had episode of paroxysmal A. fib 07/03/2008:  ASSESSMENT AND PLAN:  Active Problems:   Adult hypothyroidism   Essential (primary) hypertension   Acid reflux   Restless leg   Diabetes mellitus, type 2 (HCC)   CAD (coronary artery disease)   Atrial fibrillation with RVR (HCC)   Chronic respiratory failure with hypoxia (HCC)   Melena    Plan Patient had recurrent paroxysmal atrial  fibrillation back in sinus rhythm poor anticoagulation candidate Recommend amiodarone 200 mg twice a day for 1 to 2 weeks then switched to 200 a day for arrhythmia control Metoprolol 100 mg daily to help with tachycardia and blood pressure management Would add diltiazem short acting 60 mg 3 times a day as needed for heart rate above 120 Recommend holding anticoagulation for now because of recent GI bleeding Would not use digoxin long-term because she has paroxysmal A. Fib Patient should be safe for discharge her goal is to maintain heart rate below 120 Have the patient follow-up with Dr. Darrold Junker as an outpatient within the next 7 to 10 days   Alwyn Pea, MD 04/16/2020 4:05 PM

## 2020-04-16 NOTE — Progress Notes (Signed)
Inpatient Diabetes Program Recommendations  AACE/ADA: New Consensus Statement on Inpatient Glycemic Control (2015)  Target Ranges:  Prepandial:   less than 140 mg/dL      Peak postprandial:   less than 180 mg/dL (1-2 hours)      Critically ill patients:  140 - 180 mg/dL   Lab Results  Component Value Date   GLUCAP 273 (H) 04/16/2020   HGBA1C 7.0 (H) 04/14/2020    Review of Glycemic Control Results for TORRANCE, FRECH (MRN [id number]) as of 04/16/2020 13:26  Ref. Range 04/15/2020 21:26 04/16/2020 08:15 04/16/2020 12:28  Glucose-Capillary Latest Ref Range: 70 - 99 mg/dL 938 (H) 182 (H) 993 (H)   Diabetes history: Type 2 DM Outpatient Diabetes medications: Humalog 70/30 36 units BID Current orders for Inpatient glycemic control: Novolog 0-9 units TID, Novolog 0-5 units QHS  Inpatient Diabetes Program Recommendations:    Consider adding Novolog 70/30 8 units BID.   Thanks, Lujean Rave, MSN, RNC-OB Diabetes Coordinator [phone] (8a-5p)

## 2020-04-17 ENCOUNTER — Inpatient Hospital Stay: Payer: Medicare Other

## 2020-04-17 ENCOUNTER — Other Ambulatory Visit: Payer: Self-pay | Admitting: Internal Medicine

## 2020-04-17 DIAGNOSIS — J9611 Chronic respiratory failure with hypoxia: Secondary | ICD-10-CM | POA: Diagnosis not present

## 2020-04-17 DIAGNOSIS — E039 Hypothyroidism, unspecified: Secondary | ICD-10-CM | POA: Diagnosis not present

## 2020-04-17 DIAGNOSIS — I4891 Unspecified atrial fibrillation: Secondary | ICD-10-CM | POA: Diagnosis not present

## 2020-04-17 DIAGNOSIS — E119 Type 2 diabetes mellitus without complications: Secondary | ICD-10-CM | POA: Diagnosis not present

## 2020-04-17 LAB — CBC WITH DIFFERENTIAL/PLATELET
Abs Immature Granulocytes: 0.07 10*3/uL (ref 0.00–0.07)
Basophils Absolute: 0.1 10*3/uL (ref 0.0–0.1)
Basophils Relative: 1 %
Eosinophils Absolute: 0.3 10*3/uL (ref 0.0–0.5)
Eosinophils Relative: 4 %
HCT: 32.2 % — ABNORMAL LOW (ref 36.0–46.0)
Hemoglobin: 9.9 g/dL — ABNORMAL LOW (ref 12.0–15.0)
Immature Granulocytes: 1 %
Lymphocytes Relative: 17 %
Lymphs Abs: 1.4 10*3/uL (ref 0.7–4.0)
MCH: 26.1 pg (ref 26.0–34.0)
MCHC: 30.7 g/dL (ref 30.0–36.0)
MCV: 84.7 fL (ref 80.0–100.0)
Monocytes Absolute: 0.5 10*3/uL (ref 0.1–1.0)
Monocytes Relative: 6 %
Neutro Abs: 6 10*3/uL (ref 1.7–7.7)
Neutrophils Relative %: 71 %
Platelets: 268 10*3/uL (ref 150–400)
RBC: 3.8 MIL/uL — ABNORMAL LOW (ref 3.87–5.11)
RDW: 14.6 % (ref 11.5–15.5)
WBC: 8.4 10*3/uL (ref 4.0–10.5)
nRBC: 0 % (ref 0.0–0.2)

## 2020-04-17 LAB — BASIC METABOLIC PANEL
Anion gap: 13 (ref 5–15)
BUN: 20 mg/dL (ref 8–23)
CO2: 25 mmol/L (ref 22–32)
Calcium: 9.2 mg/dL (ref 8.9–10.3)
Chloride: 99 mmol/L (ref 98–111)
Creatinine, Ser: 0.99 mg/dL (ref 0.44–1.00)
GFR, Estimated: 55 mL/min — ABNORMAL LOW (ref >60)
Glucose, Bld: 209 mg/dL — ABNORMAL HIGH (ref 70–99)
Potassium: 4 mmol/L (ref 3.5–5.1)
Sodium: 137 mmol/L (ref 135–145)

## 2020-04-17 LAB — GLUCOSE, CAPILLARY
Glucose-Capillary: 217 mg/dL — ABNORMAL HIGH (ref 70–99)
Glucose-Capillary: 258 mg/dL — ABNORMAL HIGH (ref 70–99)
Glucose-Capillary: 203 mg/dL — ABNORMAL HIGH (ref 70–99)

## 2020-04-17 LAB — FIBRIN DERIVATIVES D-DIMER (ARMC ONLY): Fibrin derivatives D-dimer (ARMC): 555.46 ng/mL (FEU) — ABNORMAL HIGH (ref 0.00–499.00)

## 2020-04-17 MED ORDER — DOXYCYCLINE HYCLATE 100 MG PO TABS: 100.0000 mg | ORAL_TABLET | Freq: Two times a day (BID) | ORAL | 0 refills | Status: AC

## 2020-04-17 MED ORDER — DILTIAZEM HCL 60 MG PO TABS: 60.0000 mg | ORAL_TABLET | Freq: Three times a day (TID) | ORAL | 0 refills | Status: DC | PRN

## 2020-04-17 MED ORDER — AMIODARONE HCL 200 MG PO TABS: 200.0000 mg | ORAL_TABLET | Freq: Two times a day (BID) | ORAL | 0 refills | Status: DC

## 2020-04-17 MED ORDER — AMIODARONE HCL 200 MG PO TABS: 200.0000 mg | ORAL_TABLET | Freq: Every day | ORAL | 0 refills | Status: DC

## 2020-04-17 NOTE — Progress Notes (Addendum)
[facility] Cardiology    SUBJECTIVE: Patient feels reasonably well.  Ready for DC home soon.  Denies any significant palpitations or tachycardia currently   Vitals:   04/17/20 0352 04/17/20 0600 04/17/20 0757 04/17/20 0911  BP: (!) 155/57   (!) 151/62  Pulse: 65   77  Resp: 16   15  Temp: 97.6 F (36.4 C)   97.8 F (36.6 C)  TempSrc: Oral   Oral  SpO2: 99%  99% 97%  Weight:  76.7 kg    Height:         Intake/Output Summary (Last 24 hours) at 04/17/2020 1230 Last data filed at 04/16/2020 1358 Gross per 24 hour  Intake 240 ml  Output --  Net 240 ml      PHYSICAL EXAM  General: Well developed, well nourished, in no acute distress HEENT:  Normocephalic and atramatic Neck:  No JVD.  Lungs: Clear bilaterally to auscultation and percussion. Heart: HRRR . Normal S1 and S2 without gallops or murmurs.  Abdomen: Bowel sounds are positive, abdomen soft and non-tender  Msk:  Back normal, normal gait. Normal strength and tone for age. Extremities: No clubbing, cyanosis or edema.   Neuro: Alert and oriented X 3. Psych:  Good affect, responds appropriately   LABS: Basic Metabolic Panel: Recent Labs    04/15/20 0409 04/15/20 0409 04/16/20 0417 04/17/20 0533  NA 136   < > 138 137  K 4.2   < > 4.1 4.0  CL 101   < > 102 99  CO2 26   < > 26 25  GLUCOSE 184*   < > 164* 209*  BUN 15   < > 17 20  CREATININE 0.92   < > 0.89 0.99  CALCIUM 9.0   < > 8.9 9.2  MG 1.8  --   --   --   PHOS 3.7  --   --   --    < > = values in this interval not displayed.   Liver Function Tests: Recent Labs    04/15/20 0409  AST 13*  ALT 12  ALKPHOS 41  BILITOT 0.6  PROT 5.9*  ALBUMIN 3.1*   No results for input(s): LIPASE, AMYLASE in the last 72 hours. CBC: Recent Labs    04/16/20 0417 04/17/20 0533  WBC 8.7 8.4  NEUTROABS 6.2 6.0  HGB 9.8* 9.9*  HCT 32.1* 32.2*  MCV 84.5 84.7  PLT 256 268   Cardiac Enzymes: No results for input(s): CKTOTAL, CKMB, CKMBINDEX, TROPONINI in the last  72 hours. BNP: Invalid input(s): POCBNP D-Dimer: No results for input(s): DDIMER in the last 72 hours. Hemoglobin A1C: Recent Labs    04/14/20 1920  HGBA1C 7.0*   Fasting Lipid Panel: Recent Labs    04/15/20 0409  CHOL 189  HDL 35*  LDLCALC 91  TRIG 185*  CHOLHDL 5.4   Thyroid Function Tests: No results for input(s): TSH, T4TOTAL, T3FREE, THYROIDAB in the last 72 hours.  Invalid input(s): FREET3 Anemia Panel: No results for input(s): VITAMINB12, FOLATE, FERRITIN, TIBC, IRON, RETICCTPCT in the last 72 hours.  US Venous Img Lower Bilateral (DVT)  Result Date: 04/17/2020 CLINICAL DATA:  Lower extremity edema with positive D-dimer study EXAM: BILATERAL LOWER EXTREMITY VENOUS DUPLEX ULTRASOUND TECHNIQUE: Gray-scale sonography with graded compression, as well as color Doppler and duplex ultrasound were performed to evaluate the lower extremity deep venous systems from the level of the common femoral vein and including the common femoral, femoral, profunda femoral, popliteal and calf  veins including the posterior tibial, peroneal and gastrocnemius veins when visible. The superficial great saphenous vein was also interrogated. Spectral Doppler was utilized to evaluate flow at rest and with distal augmentation maneuvers in the common femoral, femoral and popliteal veins. COMPARISON:  None. FINDINGS: RIGHT LOWER EXTREMITY Common Femoral Vein: No evidence of thrombus. Normal compressibility, respiratory phasicity and response to augmentation. Saphenofemoral Junction: No evidence of thrombus. Normal compressibility and flow on color Doppler imaging. Profunda Femoral Vein: No evidence of thrombus. Normal compressibility and flow on color Doppler imaging. Femoral Vein: No evidence of thrombus. Normal compressibility, respiratory phasicity and response to augmentation. Popliteal Vein: No evidence of thrombus. Normal compressibility, respiratory phasicity and response to augmentation. Calf Veins: No  evidence of thrombus. Normal compressibility and flow on color Doppler imaging. Superficial Great Saphenous Vein: No evidence of thrombus. Normal compressibility. Venous Reflux:  None. Other Findings:  None. LEFT LOWER EXTREMITY Common Femoral Vein: No evidence of thrombus. Normal compressibility, respiratory phasicity and response to augmentation. Saphenofemoral Junction: No evidence of thrombus. Normal compressibility and flow on color Doppler imaging. Profunda Femoral Vein: No evidence of thrombus. Normal compressibility and flow on color Doppler imaging. Femoral Vein: No evidence of thrombus. Normal compressibility, respiratory phasicity and response to augmentation. Popliteal Vein: No evidence of thrombus. Normal compressibility, respiratory phasicity and response to augmentation. Calf Veins: No evidence of thrombus. Normal compressibility and flow on color Doppler imaging. Superficial Great Saphenous Vein: No evidence of thrombus. Normal compressibility. Venous Reflux:  None. Other Findings:  None. IMPRESSION: No evidence of deep venous thrombosis in either lower extremity. Electronically Signed   By: Bretta Bang III M.D.   On: 04/17/2020 11:00       TELEMETRY: Sinus rhythm paroxysmal A. fib:  ASSESSMENT AND PLAN:  Active Problems:   Adult hypothyroidism   Essential (primary) hypertension   Acid reflux   Restless leg   Diabetes mellitus, type 2 (HCC)   CAD (coronary artery disease)   Atrial fibrillation with RVR (HCC)   Chronic respiratory failure with hypoxia (HCC)   Melena    Plan Continue Amiodarone  200 mg bid x 2 weeks then reduce to 200 mg daily Recommend metoprolol 100 mg daily Cardizem short acting p.o. 30-60 daily 6 hours as needed for tachycardia heart rate above 120 Increase activity to ambulate Poor anticoagulation candidate because of GI bleeding and anemia Continue inhalers for shortness of breath COPD Have the patient follow-up with EP as an outpatient 1 to 2  weeks   Alwyn Pea, MD 04/17/2020 12:30 PM

## 2020-04-17 NOTE — Care Management Important Message (Signed)
Important Message  Patient Details  Name: Peggy [name] MRN: [id number] Date of Birth: 1928/10/12   Medicare Important Message Given:  Yes     Johnell Comings 04/17/2020, 11:01 AM

## 2020-04-17 NOTE — Progress Notes (Signed)
Inpatient Diabetes Program Recommendations  AACE/ADA: New Consensus Statement on Inpatient Glycemic Control (2015)  Target Ranges:  Prepandial:   less than 140 mg/dL      Peak postprandial:   less than 180 mg/dL (1-2 hours)      Critically ill patients:  140 - 180 mg/dL   Lab Results  Component Value Date   GLUCAP 258 (H) 04/17/2020   HGBA1C 7.0 (H) 04/14/2020    Review of Glycemic Control Results for Peggy [name], Peggy [name] (MRN [id number]) as of 04/17/2020 11:42  Ref. Range 04/16/2020 12:28 04/16/2020 16:56 04/16/2020 20:41 04/17/2020 08:26 04/17/2020 09:08  Glucose-Capillary Latest Ref Range: 70 - 99 mg/dL 683 (H) 419 (H) 622 (H) 217 (H) 258 (H)   Diabetes history: DM 2 Outpatient Diabetes medications:  Humlin 70/30 36 units bid Current orders for Inpatient glycemic control:  Novolog moderate tid with meals and HS  Inpatient Diabetes Program Recommendations:    Please consider adding Lantus 10 units daily while patient in the hospital and 70/30 on hold.   Thanks,  Beryl Meager, RN, BC-ADM Inpatient Diabetes Coordinator Pager [phone] (8a-5p)

## 2020-04-17 NOTE — Plan of Care (Signed)
No acute events over night. Pt remained in SR.  Problem: Education: Goal: Knowledge of General Education information will improve Description: Including pain rating scale, medication(s)/side effects and non-pharmacologic comfort measures Outcome: Progressing   Problem: Health Behavior/Discharge Planning: Goal: Ability to manage health-related needs will improve Outcome: Progressing   Problem: Clinical Measurements: Goal: Ability to maintain clinical measurements within normal limits will improve Outcome: Progressing Goal: Will remain free from infection Outcome: Progressing Goal: Diagnostic test results will improve Outcome: Progressing Goal: Respiratory complications will improve Outcome: Progressing Goal: Cardiovascular complication will be avoided Outcome: Progressing   Problem: Activity: Goal: Risk for activity intolerance will decrease Outcome: Progressing   Problem: Nutrition: Goal: Adequate nutrition will be maintained Outcome: Progressing   Problem: Coping: Goal: Level of anxiety will decrease Outcome: Progressing   Problem: Elimination: Goal: Will not experience complications related to bowel motility Outcome: Progressing Goal: Will not experience complications related to urinary retention Outcome: Progressing   Problem: Pain Managment: Goal: General experience of comfort will improve Outcome: Progressing   Problem: Safety: Goal: Ability to remain free from injury will improve Outcome: Progressing   Problem: Skin Integrity: Goal: Risk for impaired skin integrity will decrease Outcome: Progressing   Problem: Education: Goal: Knowledge of disease or condition will improve Outcome: Progressing Goal: Understanding of medication regimen will improve Outcome: Progressing Goal: Individualized Educational Video(s) Outcome: Progressing   Problem: Activity: Goal: Ability to tolerate increased activity will improve Outcome: Progressing   Problem:  Cardiac: Goal: Ability to achieve and maintain adequate cardiopulmonary perfusion will improve Outcome: Progressing   Problem: Health Behavior/Discharge Planning: Goal: Ability to safely manage health-related needs after discharge will improve Outcome: Progressing

## 2020-04-17 NOTE — Discharge Summary (Addendum)
Discharge Summary  Peggy [name] DPO:[id number] DOB: 11/22/1928  PCP: Peggy Regulus, MD  Admit date: 04/14/2020 Discharge date: 04/17/2020  Time spent: 40 mins  Recommendations for Outpatient Follow-up:  1. PCP in 1 week 2. Cardiology in 1 week    Discharge Diagnoses:  Active Hospital Problems   Diagnosis Date Noted  . Melena 04/14/2020  . Chronic respiratory failure with hypoxia (HCC) 10/08/2017  . Atrial fibrillation with RVR (HCC) 06/30/2017  . Adult hypothyroidism 11/15/2014  . Essential (primary) hypertension 11/15/2014  . Acid reflux 11/15/2014  . Restless leg 11/15/2014  . Diabetes mellitus, type 2 (HCC) 11/15/2014  . CAD (coronary artery disease) 02/20/2014    Resolved Hospital Problems  No resolved problems to display.    Discharge Condition: Stable  Diet recommendation: Heart healthy/moderate carb  Vitals:   04/17/20 0757 04/17/20 0911  BP:  (!) 151/62  Pulse:  77  Resp:  15  Temp:  97.8 F (36.6 C)  SpO2: 99% 97%    History of present illness:  Peggy [name] a 86 y.o.femalewith medical history significant ofa.fib rvr,htn, COPD oxygen dependent 4L, DM, CAD and h/o GIBleed. Pt sees cardiology Dr. Darrold [name]. [name] stools for week and half. BP was low and son decided to bring her to hospital. Pt initially went to walk in clinic and was referred here. Note to be in Afib with RVR, with hypotension. Last admission she was started on eliquis and she stopped it because of epistaxis. Patient admitted for further management.    Today, patient denies any new complaints, reports Xanax helped her with sleep overnight.  Patient remained in normal sinus rhythm, rate controlled overnight, denies any worsening shortness of breath, chest pain, abdominal pain, melena, hematochezia, nausea/vomiting, fever/chills.  Discussed entire discharge plan with daughter Peggy [name] over the phone.    Hospital Course:  Active Problems:   Adult hypothyroidism   Essential  (primary) hypertension   Acid reflux   Restless leg   Diabetes mellitus, type 2 (HCC)   CAD (coronary artery disease)   Atrial fibrillation with RVR (HCC)   Chronic respiratory failure with hypoxia (HCC)   Melena   Paroxysmal A. fib with RVR Currently now in sinus rhythm, rate controlled S/p diltiazem drip, IV digoxin Echo showed EF of 55 to 60%, no regional wall motion abnormality Cardiology consulted, noted to be a poor candidate for anticoagulation, so we will hold, but continue aspirin.  Recommend starting amiodarone 200 mg twice daily for 2 weeks, and then 200 mg daily, diltiazem 60 mg 3 times daily as needed for heart rates greater than 120, continue metoprolol Follow-up with cardiology in 1 week  ? Melena Normocytic anemia Possibly GI bleed, but melena could occur with nosebleeds Hemoglobin remained stable Anemia panel showed iron 33, sats 8, ferritin 14 Continue PPI, hold off Eliquis as per cardiology GI consulted-no further recommendation Follow-up with PCP in 1 week  Elevated troponin History of CAD Chest pain-free Troponin elevated, with a flat trend EKG with no acute ST changes Likely 2/2 demand ischemia Continue Lipitor, metoprolol, aspirin, Imdur Follow-up with cardiology in 1 week  Hypertension BP stable Continue metoprolol  History of COPD Chronic respiratory failure with hypoxia Continue supplemental home O2 at baseline 4 L Due to addition of amiodarone, home azithromycin was switched to doxycycline, of which her pulmonologist wants her to take every other day for 2 weeks, continue DuoNeb  Diabetes mellitus type 2 with hyperglycemia Last A1c 7 Continue home regimen  Hypothyroidism Continue Synthroid  Mildly elevated D-dimer Patient refusing CTA chest as another new IV needs to be reinserted (although very low suspicion for PE as patient was currently saturating well on room air prior to discharge even as she is on chronic 4 L of  O2) Bilateral Doppler negative for DVT  Obesity Lifestyle modification advised       Malnutrition Type:      Malnutrition Characteristics:      Nutrition Interventions:      Estimated body mass index is 30.91 kg/m as calculated from the following:   Height as of this encounter: 5\' 2"  (1.575 m).   Weight as of this encounter: 76.7 kg.    Procedures:  None  Consultations:  Cardiology  Discharge Exam: BP (!) 151/62 (BP Location: Right Arm)   Pulse 77   Temp 97.8 F (36.6 C) (Oral)   Resp 15   Ht 5\' 2"  (1.575 m)   Wt 76.7 kg   SpO2 97%   BMI 30.91 kg/m   General: NAD Cardiovascular: S1, S2 present Respiratory: Diminished breath sounds bilaterally    Discharge Instructions You were cared for by a hospitalist during your hospital stay. If you have any questions about your discharge medications or the care you received while you were in the hospital after you are discharged, you can call the unit and asked to speak with the hospitalist on call if the hospitalist that took care of you is not available. Once you are discharged, your primary care physician will handle any further medical issues. Please note that NO REFILLS for any discharge medications will be authorized once you are discharged, as it is imperative that you return to your primary care physician (or establish a relationship with a primary care physician if you do not have one) for your aftercare needs so that they can reassess your need for medications and monitor your lab values.  Discharge Instructions    Amb referral to AFIB Clinic   Complete by: As directed    Diet - low sodium heart healthy   Complete by: As directed    Increase activity slowly   Complete by: As directed      Allergies as of 04/17/2020      Reactions   Diphenhydramine Rash   AGITATION/DELIRIUM   Metformin Diarrhea   Oxybutynin Other (See Comments)   dizzy   Amlodipine Rash   Ciprofloxacin Rash   Lisinopril Rash    Penicillins Rash   Family states was a "long time ago"   Saxagliptin Rash   Sulfamethoxazole-trimethoprim Rash      Medication List    STOP taking these medications   azithromycin 250 MG tablet Commonly known as: ZITHROMAX     TAKE these medications   acetaminophen 500 MG tablet Commonly known as: TYLENOL Take 500-1,000 mg by mouth every 6 (six) hours as needed for mild pain or moderate pain.   ALPRAZolam 0.25 MG tablet Commonly known as: XANAX TAKE ONE TAB PO QHS FOR SLEEP What changed:   how much to take  how to take this  when to take this  additional instructions   amiodarone 200 MG tablet Commonly known as: PACERONE Take 1 tablet (200 mg total) by mouth 2 (two) times daily for 12 days.   amiodarone 200 MG tablet Commonly known as: Pacerone Take 1 tablet (200 mg total) by mouth daily. Start taking on: April 30, 2020   aspirin 81 MG EC tablet Take 1 tablet (81 mg total) by mouth daily.  budesonide-formoterol 80-4.5 MCG/ACT inhaler Commonly known as: SYMBICORT Inhale 2 puffs into the lungs 2 (two) times daily.   diltiazem 60 MG tablet Commonly known as: Cardizem Take 1 tablet (60 mg total) by mouth 3 (three) times daily as needed (Take if your HR is greater than 120 with palpitations).   doxycycline 100 MG tablet Commonly known as: VIBRA-TABS Take 1 tablet (100 mg total) by mouth every 12 (twelve) hours for 5 days. Take only on Mon, Wed and Friday for 2 weeks   esomeprazole 40 MG capsule Commonly known as: NexIUM Take 1 capsule (40 mg total) by mouth daily.   ferrous sulfate 325 (65 FE) MG tablet Take 325 mg by mouth daily with breakfast.   gabapentin 100 MG capsule Commonly known as: NEURONTIN Take 100 mg by mouth at bedtime.   HumuLIN 70/30 KwikPen (70-30) 100 UNIT/ML KwikPen Generic drug: insulin isophane & regular human Inject 35 Units into the skin 2 (two) times daily. What changed: how much to take   hydrOXYzine 25 MG  tablet Commonly known as: ATARAX/VISTARIL Take 25 mg by mouth 4 (four) times daily as needed for itching.   isosorbide mononitrate 30 MG 24 hr tablet Commonly known as: IMDUR Take 30 mg by mouth daily.   levothyroxine 50 MCG tablet Commonly known as: SYNTHROID Take 50 mcg by mouth daily before breakfast.   metoprolol succinate 100 MG 24 hr tablet Commonly known as: TOPROL-XL Take 1 tablet (100 mg total) by mouth daily. Take with or immediately following a meal.   nitroGLYCERIN 0.4 MG SL tablet Commonly known as: NITROSTAT Place 0.4 mg under the tongue every 5 (five) minutes as needed for chest pain.   traZODone 50 MG tablet Commonly known as: DESYREL Take 50 mg by mouth at bedtime as needed for sleep.   trospium 20 MG tablet Commonly known as: SANCTURA Take 1 tablet (20 mg total) by mouth daily.   Ventolin HFA 108 (90 Base) MCG/ACT inhaler Generic drug: albuterol Inhale 2 puffs into the lungs every 6 (six) hours as needed for wheezing or shortness of breath.   vitamin B-12 1000 MCG tablet Commonly known as: CYANOCOBALAMIN Take 1,000 mcg by mouth daily.   Vitamin D3 125 MCG (5000 UT) Tabs Take 5,000 Units by mouth daily.      Allergies  Allergen Reactions  . Diphenhydramine Rash    AGITATION/DELIRIUM  . Metformin Diarrhea  . Oxybutynin Other (See Comments)    dizzy  . Amlodipine Rash  . Ciprofloxacin Rash  . Lisinopril Rash  . Penicillins Rash    Family states was a "long time ago"  . Saxagliptin Rash  . Sulfamethoxazole-Trimethoprim Rash    Follow-up Information    Peggy Regulus, MD. Schedule an appointment as soon as possible for a visit in 1 week(s).   Specialty: Internal Medicine Contact information: [address] [facility] [place] [address] [phone]        Marcina Millard, MD. Schedule an appointment as soon as possible for a visit in 1 week(s).   Specialty: Cardiology Contact information: [address] [facility] [place] [address] [phone]                The results of significant diagnostics from this hospitalization (including imaging, microbiology, ancillary and laboratory) are listed below for reference.    Significant Diagnostic Studies: DG Chest 2 View  Result Date: 03/30/2020 CLINICAL DATA:  Shortness of breath for 3 weeks EXAM: CHEST -  2 VIEW COMPARISON:  03/22/2020 FINDINGS: Cardiac silhouette remains mildly enlarged. Atherosclerotic calcification of the aortic knob. Mildly hyperexpanded lungs. Chronically coarsened interstitial markings bilaterally. No focal airspace consolidation. No pleural effusion or pneumothorax. IMPRESSION: Stable exam.  No acute cardiopulmonary process. Electronically Signed   By: Duanne Guess D.O.   On: 03/30/2020 12:55   DG Chest 2 View  Result Date: 03/23/2020 CLINICAL DATA:  Dry cough and shortness of breath for 1 week. Ex-smoker. EXAM: CHEST - 2 VIEW COMPARISON:  01/26/2020 FINDINGS: The cardiac silhouette remains borderline enlarged. Stable hyperexpansion of the lungs diffuse prominence of the interstitial markings without pleural fluid. Diffuse osteopenia. IMPRESSION: No acute abnormality. Stable changes of COPD. Electronically Signed   By: Beckie Salts M.D.   On: 03/23/2020 20:47   US Venous Img Lower Bilateral (DVT)  Result Date: 04/17/2020 CLINICAL DATA:  Lower extremity edema with positive D-dimer study EXAM: BILATERAL LOWER EXTREMITY VENOUS DUPLEX ULTRASOUND TECHNIQUE: Gray-scale sonography with graded compression, as well as color Doppler and duplex ultrasound were performed to evaluate the lower extremity deep venous systems from the level of the common femoral vein and including the common femoral, femoral, profunda femoral, popliteal and calf veins including the posterior tibial, peroneal and gastrocnemius veins when visible. The superficial great saphenous vein was also interrogated. Spectral  Doppler was utilized to evaluate flow at rest and with distal augmentation maneuvers in the common femoral, femoral and popliteal veins. COMPARISON:  None. FINDINGS: RIGHT LOWER EXTREMITY Common Femoral Vein: No evidence of thrombus. Normal compressibility, respiratory phasicity and response to augmentation. Saphenofemoral Junction: No evidence of thrombus. Normal compressibility and flow on color Doppler imaging. Profunda Femoral Vein: No evidence of thrombus. Normal compressibility and flow on color Doppler imaging. Femoral Vein: No evidence of thrombus. Normal compressibility, respiratory phasicity and response to augmentation. Popliteal Vein: No evidence of thrombus. Normal compressibility, respiratory phasicity and response to augmentation. Calf Veins: No evidence of thrombus. Normal compressibility and flow on color Doppler imaging. Superficial Great Saphenous Vein: No evidence of thrombus. Normal compressibility. Venous Reflux:  None. Other Findings:  None. LEFT LOWER EXTREMITY Common Femoral Vein: No evidence of thrombus. Normal compressibility, respiratory phasicity and response to augmentation. Saphenofemoral Junction: No evidence of thrombus. Normal compressibility and flow on color Doppler imaging. Profunda Femoral Vein: No evidence of thrombus. Normal compressibility and flow on color Doppler imaging. Femoral Vein: No evidence of thrombus. Normal compressibility, respiratory phasicity and response to augmentation. Popliteal Vein: No evidence of thrombus. Normal compressibility, respiratory phasicity and response to augmentation. Calf Veins: No evidence of thrombus. Normal compressibility and flow on color Doppler imaging. Superficial Great Saphenous Vein: No evidence of thrombus. Normal compressibility. Venous Reflux:  None. Other Findings:  None. IMPRESSION: No evidence of deep venous thrombosis in either lower extremity. Electronically Signed   By: Bretta Bang III M.D.   On: 04/17/2020 11:00    DG Chest Portable 1 View  Result Date: 04/14/2020 CLINICAL DATA:  Shortness of breath. Chronic cough. COPD. Weakness. EXAM: PORTABLE CHEST 1 VIEW COMPARISON:  03/30/2020 FINDINGS: Indistinct pulmonary vasculature with prominence of upper zone vessels suspicious for pulmonary venous hypertension. Mild cardiomegaly is present. No overt edema or discrete airspace opacity. Atherosclerotic calcification of the aortic arch. No blunting of the costophrenic angles. Mild biapical pleuroparenchymal scarring. IMPRESSION: 1. Mild cardiomegaly with pulmonary venous hypertension. Electronically Signed   By: Gaylyn Rong M.D.   On: 04/14/2020 13:06   ECHOCARDIOGRAM COMPLETE  Result Date: 04/02/2020    ECHOCARDIOGRAM REPORT   Patient  Name:   HORTENCE CHARTER Date of Exam: 04/01/2020 Medical Rec #:  [id number]   Height:       62.0 in Accession #:    [id number]  Weight:       170.3 lb Date of Birth:  06-20-1928   BSA:          1.785 m Patient Age:    89 years    BP:           139/77 mmHg Patient Gender: F           HR:           61 bpm. Exam Location:  [facility] Procedure: 2D Echo, Cardiac Doppler and Color Doppler Indications:     CHF-Acute Diastolic 428.31 / I50.31  History:         Patient has prior history of Echocardiogram examinations. TIA;                  Risk Factors:Hypertension.  Sonographer:     Neysa Bonito Roar Referring Phys:  [id number] AGBATA Diagnosing Phys: Alwyn Pea MD IMPRESSIONS  1. Left ventricular ejection fraction, by estimation, is 55 to 60%. The left ventricle has normal function. The left ventricle has no regional wall motion abnormalities. Left ventricular diastolic parameters were normal.  2. Right ventricular systolic function is normal. The right ventricular size is normal.  3. The mitral valve is normal in structure. No evidence of mitral valve regurgitation.  4. The aortic valve is normal in structure. Aortic valve regurgitation is not visualized. FINDINGS  Left Ventricle: Left  ventricular ejection fraction, by estimation, is 55 to 60%. The left ventricle has normal function. The left ventricle has no regional wall motion abnormalities. The left ventricular internal cavity size was normal in size. There is  no left ventricular hypertrophy. Left ventricular diastolic parameters were normal. Right Ventricle: The right ventricular size is normal. No increase in right ventricular wall thickness. Right ventricular systolic function is normal. Left Atrium: Left atrial size was normal in size. Right Atrium: Right atrial size was normal in size. Pericardium: There is no evidence of pericardial effusion. Mitral Valve: The mitral valve is normal in structure. No evidence of mitral valve regurgitation. Tricuspid Valve: The tricuspid valve is normal in structure. Tricuspid valve regurgitation is not demonstrated. Aortic Valve: The aortic valve is normal in structure. Aortic valve regurgitation is not visualized. Aortic valve peak gradient measures 13.1 mmHg. Pulmonic Valve: The pulmonic valve was normal in structure. Pulmonic valve regurgitation is not visualized. Aorta: The ascending aorta was not well visualized. IAS/Shunts: No atrial level shunt detected by color flow Doppler.  LEFT VENTRICLE PLAX 2D LVIDd:         3.96 cm  Diastology LVIDs:         2.87 cm  LV e' medial:    4.79 cm/s LV PW:         1.16 cm  LV E/e' medial:  26.1 LV IVS:        1.27 cm  LV e' lateral:   6.31 cm/s LVOT diam:     1.70 cm  LV E/e' lateral: 19.8 LVOT Area:     2.27 cm  RIGHT VENTRICLE RV Mid diam:    3.06 cm RV S prime:     13.10 cm/s TAPSE (M-mode): 2.5 cm LEFT ATRIUM             Index       RIGHT ATRIUM  Index LA diam:        4.30 cm 2.41 cm/m  RA Area:     14.10 cm LA Vol (A2C):   78.3 ml 43.85 ml/m RA Volume:   29.00 ml  16.24 ml/m LA Vol (A4C):   45.5 ml 25.48 ml/m LA Biplane Vol: 64.2 ml 35.96 ml/m  AORTIC VALVE                PULMONIC VALVE AV Area (Vmax): 1.12 cm    PV Vmax:        1.13 m/s AV  Vmax:        181.00 cm/s PV Peak grad:   5.1 mmHg AV Peak Grad:   13.1 mmHg   RVOT Peak grad: 1 mmHg LVOT Vmax:      89.40 cm/s  AORTA Ao Root diam: 2.80 cm MITRAL VALVE MV Area (PHT): 2.37 cm     SHUNTS MV Decel Time: 320 msec     Systemic Diam: 1.70 cm MV E velocity: 125.00 cm/s MV A velocity: 90.00 cm/s MV E/A ratio:  1.39 MV A Prime:    7.1 cm/s Dwayne D Callwood MD Electronically signed by Alwyn Pea MD Signature Date/Time: 04/02/2020/4:27:03 PM    Final     Microbiology: Recent Results (from the past 240 hour(s))  Respiratory Panel by RT PCR (Flu A&B, Covid) - Nasopharyngeal Swab     Status: None   Collection Time: 04/14/20 11:55 AM   Specimen: Nasopharyngeal Swab  Result Value Ref Range Status   SARS Coronavirus 2 by RT PCR NEGATIVE NEGATIVE Final    Comment: (NOTE) SARS-CoV-2 target nucleic acids are NOT DETECTED.  The SARS-CoV-2 RNA is generally detectable in upper respiratoy specimens during the acute phase of infection. The lowest concentration of SARS-CoV-2 viral copies this assay can detect is 131 copies/mL. A negative result does not preclude SARS-Cov-2 infection and should not be used as the sole basis for treatment or other patient management decisions. A negative result may occur with  improper specimen collection/handling, submission of specimen other than nasopharyngeal swab, presence of viral mutation(s) within the areas targeted by this assay, and inadequate number of viral copies (<131 copies/mL). A negative result must be combined with clinical observations, patient history, and epidemiological information. The expected result is Negative.  Fact Sheet for Patients:  https://www.moore.com/  Fact Sheet for Healthcare Providers:  https://www.young.biz/  This test is no t yet approved or cleared by the [place] FDA and  has been authorized for detection and/or diagnosis of SARS-CoV-2 by FDA under an Emergency Use  Authorization (EUA). This EUA will remain  in effect (meaning this test can be used) for the duration of the COVID-19 declaration under Section 564(b)(1) of the Act, 21 U.S.C. section 360bbb-3(b)(1), unless the authorization is terminated or revoked sooner.     Influenza A by PCR NEGATIVE NEGATIVE Final   Influenza B by PCR NEGATIVE NEGATIVE Final    Comment: (NOTE) The Xpert Xpress SARS-CoV-2/FLU/RSV assay is intended as an aid in  the diagnosis of influenza from Nasopharyngeal swab specimens and  should not be used as a sole basis for treatment. Nasal washings and  aspirates are unacceptable for Xpert Xpress SARS-CoV-2/FLU/RSV  testing.  Fact Sheet for Patients: https://www.moore.com/  Fact Sheet for Healthcare Providers: https://www.young.biz/  This test is not yet approved or cleared by the [place] FDA and  has been authorized for detection and/or diagnosis of SARS-CoV-2 by  FDA under an Emergency Use Authorization (EUA). This EUA  will remain  in effect (meaning this test can be used) for the duration of the  Covid-19 declaration under Section 564(b)(1) of the Act, 21  U.S.C. section 360bbb-3(b)(1), unless the authorization is  terminated or revoked. Performed at [facility], [address] [place], [place] [address]      Labs: Basic Metabolic Panel: Recent Labs  Lab 04/14/20 1154 04/15/20 0409 04/16/20 0417 04/17/20 0533  NA 137 136 138 137  K 4.0 4.2 4.1 4.0  CL 102 101 102 99  CO2 GLUCOSE 103* 184* 164* 209*  BUN CREATININE 0.76 0.92 0.89 0.99  CALCIUM 9.0 9.0 8.9 9.2  MG  --  1.8  --   --   PHOS  --  3.7  --   --    Liver Function Tests: Recent Labs  Lab 04/14/20 1154 04/15/20 0409  AST 16 13*  ALT 15 12  ALKPHOS 48 41  BILITOT 0.6 0.6  PROT 6.5 5.9*  ALBUMIN 3.4* 3.1*   No results for input(s): LIPASE, AMYLASE in the last 168 hours. No results for input(s):  AMMONIA in the last 168 hours. CBC: Recent Labs  Lab 04/14/20 1154 04/15/20 0409 04/16/20 0417 04/17/20 0533  WBC 8.7 9.5 8.7 8.4  NEUTROABS 6.3 6.9 6.2 6.0  HGB 10.2* 9.5* 9.8* 9.9*  HCT 33.8* 31.5* 32.1* 32.2*  MCV 85.8 84.9 84.5 84.7  PLT 273 272 256 268   Cardiac Enzymes: No results for input(s): CKTOTAL, CKMB, CKMBINDEX, TROPONINI in the last 168 hours. BNP: BNP (last 3 results) Recent Labs    03/30/20 1045 04/14/20 1154  BNP 395.9* 478.7*    ProBNP (last 3 results) No results for input(s): PROBNP in the last 8760 hours.  CBG: Recent Labs  Lab 04/16/20 1656 04/16/20 2041 04/17/20 0826 04/17/20 0908 04/17/20 1154  GLUCAP 168* 226* 217* 258* 203*       Signed:  Briant Cedar, MD [facility] 04/17/2020, 12:26 PM

## 2020-04-17 NOTE — Progress Notes (Signed)
OT Cancellation Note  Patient Details Name: ROSAMOND ANDRESS MRN: [id number] DOB: 1929/04/25   Cancelled Treatment:    Reason Eval/Treat Not Completed: Other (comment). Pt pending imaging for possible DVTs. Will hold OT tx at this time and re-attempt as medically appropriate.  Richrd Prime, MPH, MS, OTR/L ascom [phone] 04/17/20, 9:51 AM

## 2020-04-20 ENCOUNTER — Telehealth: Payer: Self-pay | Admitting: Primary Care

## 2020-04-23 ENCOUNTER — Telehealth: Payer: Self-pay | Admitting: Primary Care

## 2020-04-23 NOTE — Telephone Encounter (Signed)
Spoke with daughter, Evangeline Dakin, regarding the Palliative referral/services and she was in agreement with scheduling visit.  Daughter has requested a visit on a Wed.(she is with the patient on that day and wishes to be present).  Explained to daughter that the NP goes to facilities to see patients on that day and that I would need to speak with her to see if she could see patient and she was in agreement with this.

## 2020-04-23 NOTE — Telephone Encounter (Signed)
Spoke with daughter, [name] and have scheduled an In-home Palliative Consult for 05/02/20 @ 2 PM.

## 2020-05-02 ENCOUNTER — Other Ambulatory Visit: Payer: Self-pay

## 2020-05-02 ENCOUNTER — Other Ambulatory Visit: Payer: Medicare Other | Admitting: Primary Care

## 2020-05-02 DIAGNOSIS — R0602 Shortness of breath: Secondary | ICD-10-CM

## 2020-05-02 DIAGNOSIS — J449 Chronic obstructive pulmonary disease, unspecified: Secondary | ICD-10-CM

## 2020-05-02 DIAGNOSIS — Z515 Encounter for palliative care: Secondary | ICD-10-CM

## 2020-05-02 DIAGNOSIS — R269 Unspecified abnormalities of gait and mobility: Secondary | ICD-10-CM | POA: Insufficient documentation

## 2020-05-02 DIAGNOSIS — I4891 Unspecified atrial fibrillation: Secondary | ICD-10-CM

## 2020-05-02 DIAGNOSIS — I639 Cerebral infarction, unspecified: Secondary | ICD-10-CM

## 2020-05-02 NOTE — Progress Notes (Signed)
[facility] Consult Note Telephone: [phone]  Fax: [phone]     Date of encounter: 05/02/20 PATIENT NAME: Peggy [name] [address] [place] [address] [phone] (home)  DOB: [birth year] MRN: [id number]  PRIMARY CARE PROVIDER:    Kirk Ruths, MD,  [place] [address] [phone]  REFERRING PROVIDER:   Kirk Ruths, MD [place],  [place] [address] [phone]  RESPONSIBLE PARTY:   Extended Emergency Contact Information Primary Emergency Contact: [name],Peggy  [place] Phone: [phone] Relation: Daughter Secondary Emergency Contact: [name],Peggy Mobile Phone: [phone] Relation: Daughter  I met face to face with patient and family in the home. Palliative Care was asked to follow this patient by consultation request of Peggy Ruths, MD to help address advance care planning and goals of care. This is the initial  Visit.     ASSESSMENT AND RECOMMENDATIONS:   1. Advance Care Planning/Goals of Care: Goals include to maximize quality of life and symptom management. Our advance care planning conversation included a discussion about:     The value and importance of advance care planning   Exploration of personal, cultural or spiritual beliefs that might influence medical decisions   Exploration of goals of care in the event of a sudden injury or illness   Identificationof a healthcare agent - Daughter Peggy [name]  Review and creation of an  advance directive document .  Reviewed living will and POA roles, both durable and HC. Daughter to look into doing a durable poa.  Decision not to resuscitate DNR prepared. Daughter and POA states recently patient wanted full code, so this is a new choice for her. I prepared the [facility] DNR and left with them. I left a MOST for them to review.  We discussed the indications and options for the various levels of MOST intervention.  I spent 25 minutes providing this consultation,  from 1600 to 1625. More than 50% of the time in this consultation was spent coordinating communication.   2. Symptom Management:    Pain: Has new neck pain, from pulled muscle allegedly. MD has given meloxicam but patient reticent to take. Has used some narcotic from former supply with good results. I recommended Acetamiphen CR 1300 mg q 8 hr x 3 weeks, then decrease to 650 mg q8rs. Ongoing. Instructed to report if pain does not improve or if it worsens.  Insomnia: Some nights due to pain and anxiety. Takes xanax for this. Has trazodone 50 mg which has not helped. Recommend to increase to 100 mg for sleep. Uses oxygen at hs, 2 l / .  Abnormality of gait: Has some difficulty rising from sitting from OA. She does not want to use cane or rollator. Daughter reports pt holds to furniture to ambulate. Recommend to use rollator at all times to avoid more injury. Discussed fall prevention modalities.  Xerostomia: Recommend biotene rinse and atmospheric humidity. May also benefit from oxygen humidity during use at hs.  3. Follow up Palliative Care Visit: Palliative care will continue to follow for goals of care clarification and symptom management. Return 6 weeks or prn.  4. Family /Caregiver/Community Supports:  Lives in own home with daughter. Has children who help one day a week and has caregivers hired as well.  5. Cognitive / Functional decline: A and O x 3.  HOH and with mask has more trouble,  Able to do many adls, needs assistance with iadls.   CODE STATUS: DNR  PPS: 60%  HOSPICE ELIGIBILITY/DIAGNOSIS: no  Subjective:  CHIEF COMPLAINT: acute on chronic pain, advance care planning, xerostomia, abnormality of gait  HISTORY OF PRESENT ILLNESS:  Peggy [name] is a 86 y.o. year old female  with recent hospital stay for fall, GI bleed. Has h/o CAD, COPD and  DM. Now seen for  Acute on chronic pain and debility in the home Pain is in the cervical spine, constant, varies from moderate to  severe, onset in past several weeks, and in the context of  a neck misalignment. Patient has tried OTC tylenol extra strength and massage. She has used heat with min. Improvement. This pain impacts her ability to move about and do all her adls. Other issues include insomnia, abnormality of gait and min. Use of assistive device.  We are asked to consult around ACP and complex medical decision making.    ~Review and summarization of old 50 records shows or history from other than patient  shows history of advanced coronary disease and a fib, failed doacs due to chronic bleeds. ~Review or lab tests, radiology,  or medicine RE A1C of 7, well controlled diabetes ~Review of case with family member daughter and POA Peggy [name], who outlines patient course.  History obtained from review of EMR, discussion with primary team, and  interview with family, caregiver  and/or Peggy [name]. Records reviewed and summarized above.   CURRENT PROBLEM LIST:  Patient Active Problem List   Diagnosis Date Noted  . Atrial fibrillation with rapid ventricular response ([place]) 04/14/2020  . Melena 04/14/2020  . COPD with acute exacerbation ([place]) 03/30/2020  . Elevated troponin level 03/30/2020  . Leukocytosis 03/30/2020  . COPD (chronic obstructive pulmonary disease) ([place]) 03/22/2020  . Uncontrolled hypertension 11/22/2018  . Chronic respiratory failure with hypoxia ([place]) 10/08/2017  . Simple chronic bronchitis ([place]) 10/08/2017  . SOB (shortness of breath) 09/24/2017  . History of stroke 08/17/2017  . Acute respiratory failure with hypoxia and hypercarbia ([place]) 08/07/2017  . Respiratory failure ([place]) 08/07/2017  . History of GI bleed 07/07/2017  . Atrial fibrillation with RVR ([place]) 06/30/2017  . Bilateral carotid artery disease ([place]) 04/14/2017  . Healthcare maintenance 04/14/2017  . Dizziness  04/07/2017  . 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 TIA (transient ischemic attack) 12/10/2016  . CVA (cerebral vascular accident) ([place]) 12/09/2016  . Bilateral arm pain 11/27/2016  . Myocardial infarction ([place]) 07/30/2015  . Urinary frequency 02/09/2015  . RUQ abdominal pain 01/26/2015  . Shortness of breath 01/26/2015  . Anxiety 01/26/2015  . Chronic pruritus 12/21/2014  . Cerebral vascular accident ([place]) 11/16/2014  . Chronic low back pain 11/16/2014  . Adult hypothyroidism 11/15/2014  . Allergic rhinitis 11/15/2014  . Body mass index (BMI) of 28.0-28.9 in adult 11/15/2014  . Arthritis 11/15/2014  . Cramps of lower extremity 11/15/2014  . Essential (primary) hypertension 11/15/2014  . Acid reflux 11/15/2014  . Hypercholesteremia 11/15/2014  . Cannot sleep 11/15/2014  . Psoriasis 11/15/2014  . Itch of skin 11/15/2014  . Restless leg 11/15/2014  . Diabetes mellitus, type 2 ([place]) 11/15/2014  . Breath shortness 11/15/2014  . Dermatitis due to unknown cause 04/10/2014  . CAD (coronary artery disease) 02/20/2014  . AF (paroxysmal atrial fibrillation) ([place]) 02/20/2014  . Temporary cerebral vascular dysfunction 02/20/2014  . DD (diverticular disease) 10/05/2013   PAST MEDICAL HISTORY:  Active Ambulatory Problems    Diagnosis Date  Noted  . Adult hypothyroidism 11/15/2014  . Allergic rhinitis 11/15/2014  . Body mass index (BMI) of 28.0-28.9 in adult 11/15/2014  . Arthritis 11/15/2014  . Cramps of lower extremity 11/15/2014  . DD (diverticular disease) 10/05/2013  . Essential (primary) hypertension 11/15/2014  . Acid reflux 11/15/2014  . Hypercholesteremia 11/15/2014  . Cannot sleep 11/15/2014  . Psoriasis 11/15/2014  . Itch of skin 11/15/2014  . Restless leg 11/15/2014  . Diabetes mellitus, type 2 ([place]) 11/15/2014  . Breath shortness 11/15/2014  . CAD (coronary artery disease) 02/20/2014  . AF (paroxysmal atrial fibrillation) ([place]) 02/20/2014  .  Cerebral vascular accident ([place]) 11/16/2014  . Temporary cerebral vascular dysfunction 02/20/2014  . Chronic low back pain 11/16/2014  . Chronic pruritus 12/21/2014  . RUQ abdominal pain 01/26/2015  . Shortness of breath 01/26/2015  . Anxiety 01/26/2015  . Urinary frequency 02/09/2015  . Myocardial infarction ([place]) 07/30/2015  . CVA (cerebral vascular accident) ([place]) 12/09/2016  . TIA (transient ischemic attack) 12/10/2016  . Atrial fibrillation with RVR ([place]) 06/30/2017  . Acute respiratory failure with hypoxia and hypercarbia ([place]) 08/07/2017  . Respiratory failure ([place]) 08/07/2017  . Bilateral arm pain 11/27/2016  . Bilateral carotid artery disease ([place]) 04/14/2017  . Dermatitis due to unknown cause 04/10/2014  . Dizziness 04/07/2017  . Healthcare maintenance 04/14/2017  . History of GI bleed 07/07/2017  . History of stroke 08/17/2017  . 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 SOB (shortness of breath) 09/24/2017  . Chronic respiratory failure with hypoxia ([place]) 10/08/2017  . Simple chronic bronchitis ([place]) 10/08/2017  . Uncontrolled hypertension 11/22/2018  . COPD (chronic obstructive pulmonary disease) ([place]) 03/22/2020  . COPD with acute exacerbation ([place]) 03/30/2020  . Elevated troponin level 03/30/2020  . Leukocytosis 03/30/2020  . Atrial fibrillation with rapid ventricular response ([place]) 04/14/2020  . Melena 04/14/2020   Resolved Ambulatory Problems    Diagnosis Date Noted  . Atherosclerosis of coronary artery 11/15/2014  . Chronic pain 11/15/2014  . Diverticulitis of colon with perforation 11/15/2014  . Heart attack ([place]) 11/16/2014  . Flu vaccine need 03/02/2015  . Controlled type 2 diabetes mellitus without complication ([place]) 27/08/5007   Past Medical History:  Diagnosis Date  . A-fib ([place])   . Allergy   . Diabetes mellitus without complication ([place])   . GERD (gastroesophageal reflux disease)   . Hyperlipidemia   . Hypertension   .  Oxygen dependent   . Stroke [facility])    SOCIAL HX:  Social History   Tobacco Use  . Smoking status: Former Smoker    Packs/day: 1.00    Years: 30.00    Pack years: 30.00    Quit date: 11/30/1999    Years since quitting: 20.4  . Smokeless tobacco: Never Used  Substance Use Topics  . Alcohol use: No   FAMILY HX:  Family History  Problem Relation Age of Onset  . Breast cancer Mother   . Pancreatitis Father   . Breast cancer Sister   . Lung cancer Brother   . Melanoma Brother   . Throat cancer Brother      ALLERGIES:  Allergies  Allergen Reactions  . Diphenhydramine Rash    AGITATION/DELIRIUM  . Metformin Diarrhea  . Oxybutynin Other (See Comments)    dizzy  . Amlodipine Rash  . Ciprofloxacin Rash  . Lisinopril Rash  . Penicillins Rash    Family states was a "long time ago"  . Saxagliptin Rash  . Sulfamethoxazole-Trimethoprim Rash  PERTINENT MEDICATIONS:  Outpatient Encounter Medications as of 05/02/2020  Medication Sig  . acetaminophen (TYLENOL) 500 MG tablet Take 500-1,000 mg by mouth every 6 (six) hours as needed for mild pain or moderate pain.  Marland Kitchen ALPRAZolam (XANAX) 0.25 MG tablet TAKE ONE TAB PO QHS FOR SLEEP (Patient taking differently: Take 0.25 mg by mouth at bedtime. )  . amiodarone (PACERONE) 200 MG tablet Take 1 tablet (200 mg total) by mouth 2 (two) times daily for 12 days.  Marland Kitchen amiodarone (PACERONE) 200 MG tablet Take 1 tablet (200 mg total) by mouth daily.  Marland Kitchen aspirin EC 81 MG EC tablet Take 1 tablet (81 mg total) by mouth daily.  . Cholecalciferol (VITAMIN D3) 5000 units TABS Take 5,000 Units by mouth daily.  Marland Kitchen diltiazem (CARDIZEM) 60 MG tablet Take 1 tablet (60 mg total) by mouth 3 (three) times daily as needed (Take if your HR is greater than 120 with palpitations).  . esomeprazole (NEXIUM) 40 MG capsule Take 1 capsule (40 mg total) by mouth daily.  . ferrous sulfate 325 (65 FE) MG tablet Take 325 mg by mouth daily with breakfast.  . gabapentin  (NEURONTIN) 100 MG capsule Take 100 mg by mouth at bedtime.   Marland Kitchen HUMULIN 70/30 KWIKPEN (70-30) 100 UNIT/ML KwikPen Inject 35 Units into the skin 2 (two) times daily. (Patient taking differently: Inject 36 Units into the skin 2 (two) times daily. )  . hydrOXYzine (ATARAX/VISTARIL) 25 MG tablet Take 25 mg by mouth 4 (four) times daily as needed for itching.  . isosorbide mononitrate (IMDUR) 30 MG 24 hr tablet Take 30 mg by mouth daily.  Marland Kitchen levothyroxine (SYNTHROID) 50 MCG tablet Take 50 mcg by mouth daily before breakfast.  . metoprolol succinate (TOPROL-XL) 100 MG 24 hr tablet Take 1 tablet (100 mg total) by mouth daily. Take with or immediately following a meal.  . nitroGLYCERIN (NITROSTAT) 0.4 MG SL tablet Place 0.4 mg under the tongue every 5 (five) minutes as needed for chest pain.   . SYMBICORT 80-4.5 MCG/ACT inhaler INHALE 2 PUFFS INTO THE LUNGS TWICE DAILY  . traZODone (DESYREL) 50 MG tablet Take 50 mg by mouth at bedtime as needed for sleep.   . trospium (SANCTURA) 20 MG tablet Take 1 tablet (20 mg total) by mouth daily.  . VENTOLIN HFA 108 (90 Base) MCG/ACT inhaler Inhale 2 puffs into the lungs every 6 (six) hours as needed for wheezing or shortness of breath.   . vitamin B-12 (CYANOCOBALAMIN) 1000 MCG tablet Take 1,000 mcg by mouth daily.   No facility-administered encounter medications on file as of 05/02/2020.    Objective: ROS  General: NAD EYES: denies vision changes, wears glasses to read ENMT: denies dysphagia,endorses xerostomia Cardiovascular: denies chest pain, has h/o angina Pulmonary: denies cough, denies increased SOB,  endorses DOE, oxygen at hs. Abdomen: endorses good appetite, endorses constipation, endorses continence of bowel GU: denies dysuria, endorses occ incontinence of urine MSK:  endorses ROM limitations, + falls reported Skin: denies rashes or wounds Neurological: endorses weakness, endorses neck pain, endorses insomnia Psych: Endorses positive  mood Heme/lymph/immuno: denies bruises, abnormal bleeding, has h/o and is now off DOACs.  Physical Exam: Current and past weights: 169 lbs  Has lost 8 lbs recently. Constitutional: NAD General :frail appearing, WNWD EYES: anicteric sclera,lids intact, no discharge  ENMT: hard of  hearing,oral mucous membranes dry dentition intact CV: no LE edema Pulmonary:  no increased work of breathing, no cough, no audible wheezes, room air during day, oxygen  at hs Abdomen: intake 75%, no ascites MSK: mild sacropenia, decreased ROM in all extremities, no contractures of LE, ambulatory Skin: warm and dry, no rashes or wounds on visible skin Neuro: generalized weakness, slight cognitive impairment, grossly non -focal Psych: non -anxious affect, A and O x 3 Hem/lymph/immuno: no widespread bruising on visible skin   Thank you for the opportunity to participate in the care of Ms. Morace.  The palliative care team will continue to follow. Please call our office at [phone] if we can be of additional assistance.  Jason Coop, NP , DNP, MPH, AGPCNP-BC, ACHPN  COVID-19 PATIENT SCREENING TOOL  Person answering questions: ____________self______ _____   1.  Is the patient or any family member in the home showing any signs or symptoms regarding respiratory infection?               Person with Symptom- __________NA_________________  a. Fever                                                                          Yes___ No___          ___________________  b. Shortness of breath                                                    Yes___ No___          ___________________ c. Cough/congestion                                       Yes___  No___         ___________________ d. Body aches/pains                                                         Yes___ No___        ____________________ e. Gastrointestinal symptoms (diarrhea, nausea)           Yes___ No___        ____________________  2. Within the  past 14 days, has anyone living in the home had any contact with someone with or under investigation for COVID-19?    Yes___ No_X_   Person __________________

## 2020-05-10 ENCOUNTER — Ambulatory Visit: Payer: Medicare Other | Admitting: Internal Medicine

## 2020-05-27 ENCOUNTER — Other Ambulatory Visit
Admission: RE | Admit: 2020-05-27 | Discharge: 2020-05-27 | Disposition: A | Discharge status: Home / Self Care | Payer: Medicare Other | Source: Ambulatory Visit | Attending: Internal Medicine | Admitting: Internal Medicine

## 2020-05-27 DIAGNOSIS — R197 Diarrhea, unspecified: Secondary | ICD-10-CM | POA: Diagnosis present

## 2020-05-27 LAB — C DIFFICILE QUICK SCREEN W PCR REFLEX
C Diff antigen: NEGATIVE
C Diff interpretation: NOT DETECTED
C Diff toxin: NEGATIVE

## 2020-05-30 ENCOUNTER — Telehealth: Payer: Self-pay

## 2020-05-30 NOTE — Telephone Encounter (Signed)
Pt's daughter [name] called stating that pt was having a cough that started last week.  Pt had some prednisone and z-pak left over from before.  Per Ladona Ridgel pt can take the 10 mg prednisone 1 a day for 5 days and the z-pak take 1 tablet a day for 9 days.  Daughter was informed that if no better let us know

## 2020-06-20 ENCOUNTER — Other Ambulatory Visit: Payer: Medicare Other | Admitting: Primary Care

## 2020-06-22 ENCOUNTER — Other Ambulatory Visit: Payer: Medicare Other | Admitting: Primary Care

## 2020-06-28 ENCOUNTER — Other Ambulatory Visit: Payer: Medicare Other | Admitting: Primary Care

## 2020-06-28 ENCOUNTER — Other Ambulatory Visit: Payer: Self-pay

## 2020-06-28 DIAGNOSIS — I4891 Unspecified atrial fibrillation: Secondary | ICD-10-CM

## 2020-06-28 DIAGNOSIS — Z515 Encounter for palliative care: Secondary | ICD-10-CM

## 2020-06-28 DIAGNOSIS — J449 Chronic obstructive pulmonary disease, unspecified: Secondary | ICD-10-CM

## 2020-06-28 DIAGNOSIS — R0602 Shortness of breath: Secondary | ICD-10-CM

## 2020-06-28 DIAGNOSIS — I639 Cerebral infarction, unspecified: Secondary | ICD-10-CM

## 2020-06-28 DIAGNOSIS — G459 Transient cerebral ischemic attack, unspecified: Secondary | ICD-10-CM

## 2020-06-28 NOTE — Progress Notes (Signed)
[facility] Consult Note Telephone: [phone]  Fax: [phone]     Date of encounter: 06/28/20 PATIENT NAME: Peggy [name] [address] [place] [address] [phone] (home)  DOB: 1929/01/03 MRN: [id number]  PRIMARY CARE PROVIDER:    Kirk Ruths, MD,  [place] [address] [phone]  REFERRING PROVIDER:   Kirk Ruths, MD [address],  [place] [address] [phone]  RESPONSIBLE PARTY:   Extended Emergency Contact Information Primary Emergency Contact: Rector,Michelle  [place] Phone: [phone] Relation: Daughter Secondary Emergency Contact: McDonald,Kathy [place] Phone: [phone] Relation: Daughter  I met face to face with patient and family in  Home. Palliative Care was asked to follow this patient by consultation request of Kirk Ruths, MD to help address advance care planning and goals of care. This is a follow up  visit.   ASSESSMENT AND RECOMMENDATIONS:   1. Advance Care Planning/Goals of Care: Goals include to maximize quality of life and symptom management. Our advance care planning conversation included a discussion about:     The value and importance of advance care planning   Exploration of personal, cultural or spiritual beliefs that might influence medical decisions   Exploration of goals of care in the event of a sudden injury or illness   Identification and preparation of a healthcare agent   Creation of an  advance directive document .  Decision not to resuscitate  due to poor prognosis.  Finished MOST form, education provided and discussion RE the scopes of care offered, and appropriate times for use. Patient elected DNR, comfort measures, use of abx, determine use for IV, no feeding tube.  This uploaded to    I spent 20  minutes providing this consultation,   from 1130 to 1150. More than 50% of the time in this consultation was spent in counseling and care coordination. ______________________________________________________________________  2. Symptom Management:  Dyspnea: Takes maintenance dosing and prn nebulizers.  Endorses forgetting pm dose at times. No longer using daytime oxygen, sats remain in mid 90's.  Recommend oxygen at hs if daytime drowsiness.  Mobility: Reviewed using rollator for safety. Taught fall prevention and need to use rollator for touch balance and rest periods.   Hypoglycemia: Taking 30 units insulin bid. Last A1 C in 11/21 is 7. Daughter endorses poor intake. Patient endorses erratic eating and erratic glucoses. Recommend referral to endocrine and to nutritionist ASAP due to hypoglycemia. Recommend basal insulin due to several dangerous low sugar episodes.RECOMENDED to get rescue glucose preparation in home.  3. Follow up Palliative Care Visit: Palliative care will continue to follow for goals of care clarification and symptom management. Return 6-8 weeks or prn.  4. Family /Caregiver/Community Supports: Lives with daughter in own home. Other daughters come in to help.  5. Cognitive / Functional decline: A and O x 3. Able to do many adls and ialds.   CODE STATUS: DNR  PPS: 50%  HOSPICE ELIGIBILITY/DIAGNOSIS: TBD  Subjective:  CHIEF COMPLAINT: hypoglycemia  HISTORY OF PRESENT ILLNESS:  Peggy [name] is a 87 y.o. year old female  with DM,  Recent hypoglycemia, COPD, fall risk . She has reported hypoglycemia in the context of insulin dependent diabetes. She has had periods of low glucose eg 48 mg/dl. She has had periods of dizziness and weakness from this. She has no glucose concentrate/glucagon in the home as  was advised on last visit. She has increased risk of poor outcomes due to age and debility.  We are asked to consult around advance care planning and complex medical decision making.    Review and summarization of  old Epic records shows or history from other than patient. Review or lab tests, radiology,  or medicine=  A1C and recent labs Review of case with family member Daughter Juliann Pulse. Decision for obtaining previous records from cardiology, endocrinology- thinks she may have had endocrine in the past.  History obtained from review of EMR, discussion with primary team, and  interview with family, caregiver  and/or Ms. Seebeck. Records reviewed and summarized above.   CURRENT PROBLEM LIST:  Patient Active Problem List   Diagnosis Date Noted  . Abnormality of gait 05/02/2020  . Atrial fibrillation with rapid ventricular response ([place]) 04/14/2020  . Melena 04/14/2020  . COPD with acute exacerbation ([place]) 03/30/2020  . Elevated troponin level 03/30/2020  . Leukocytosis 03/30/2020  . COPD (chronic obstructive pulmonary disease) ([place]) 03/22/2020  . Chest pain with high risk for cardiac etiology 11/09/2019  . Osteoarthritis of multiple joints 03/01/2019  . Uncontrolled hypertension 11/22/2018  . Primary osteoarthritis of both knees 09/07/2018  . Chronic respiratory failure with hypoxia ([place]) 10/08/2017  . Simple chronic bronchitis ([place]) 10/08/2017  . SOB (shortness of breath) 09/24/2017  . History of stroke 08/17/2017  . Acute respiratory failure with hypoxia and hypercarbia ([place]) 08/07/2017  . Respiratory failure ([place]) 08/07/2017  . History of GI bleed 07/07/2017  . Atrial fibrillation with RVR ([place]) 06/30/2017  . Bilateral carotid artery disease ([place]) 04/14/2017  . Healthcare maintenance 04/14/2017  . Dizziness 04/07/2017  . 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 TIA (transient ischemic attack) 12/10/2016  . CVA (cerebral vascular accident) ([place]) 12/09/2016  . Bilateral arm pain 11/27/2016  . Myocardial infarction ([place]) 07/30/2015  . Urinary frequency 02/09/2015  . RUQ abdominal pain 01/26/2015  . Shortness of breath 01/26/2015  . Anxiety 01/26/2015  . Chronic pruritus  12/21/2014  . Cerebral vascular accident ([place]) 11/16/2014  . Chronic low back pain 11/16/2014  . Adult hypothyroidism 11/15/2014  . Allergic rhinitis 11/15/2014  . Body mass index (BMI) of 28.0-28.9 in adult 11/15/2014  . Arthritis 11/15/2014  . Cramps of lower extremity 11/15/2014  . Essential (primary) hypertension 11/15/2014  . Acid reflux 11/15/2014  . Hypercholesteremia 11/15/2014  . Cannot sleep 11/15/2014  . Psoriasis 11/15/2014  . Itch of skin 11/15/2014  . Restless leg 11/15/2014  . Diabetes mellitus, type 2 ([place]) 11/15/2014  . Breath shortness 11/15/2014  . Dermatitis due to unknown cause 04/10/2014  . CAD (coronary artery disease) 02/20/2014  . AF (paroxysmal atrial fibrillation) ([place]) 02/20/2014  . Temporary cerebral vascular dysfunction 02/20/2014  . DD (diverticular disease) 10/05/2013   PAST MEDICAL HISTORY:  Active Ambulatory Problems    Diagnosis Date Noted  . Adult hypothyroidism 11/15/2014  . Allergic rhinitis 11/15/2014  . Body mass index (BMI) of 28.0-28.9 in adult 11/15/2014  . Arthritis 11/15/2014  . Cramps of lower extremity 11/15/2014  . DD (diverticular disease) 10/05/2013  . Essential (primary) hypertension 11/15/2014  . Acid reflux 11/15/2014  . Hypercholesteremia 11/15/2014  . Cannot sleep 11/15/2014  . Psoriasis 11/15/2014  . Itch of skin 11/15/2014  . Restless leg 11/15/2014  . Diabetes mellitus, type 2 ([place]) 11/15/2014  . Breath shortness 11/15/2014  . CAD (coronary artery disease) 02/20/2014  . AF (paroxysmal atrial fibrillation) ([place]) 02/20/2014  . Cerebral vascular  accident ([place]) 11/16/2014  . Temporary cerebral vascular dysfunction 02/20/2014  . Chronic low back pain 11/16/2014  . Chronic pruritus 12/21/2014  . RUQ abdominal pain 01/26/2015  . Shortness of breath 01/26/2015  . Anxiety 01/26/2015  . Urinary frequency 02/09/2015  . Myocardial infarction ([place]) 07/30/2015  . CVA (cerebral vascular accident) ([place]) 12/09/2016  . TIA  (transient ischemic attack) 12/10/2016  . Atrial fibrillation with RVR ([place]) 06/30/2017  . Acute respiratory failure with hypoxia and hypercarbia ([place]) 08/07/2017  . Respiratory failure ([place]) 08/07/2017  . Bilateral arm pain 11/27/2016  . Bilateral carotid artery disease ([place]) 04/14/2017  . Dermatitis due to unknown cause 04/10/2014  . Dizziness 04/07/2017  . Healthcare maintenance 04/14/2017  . History of GI bleed 07/07/2017  . History of stroke 08/17/2017  . 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 SOB (shortness of breath) 09/24/2017  . Chronic respiratory failure with hypoxia ([place]) 10/08/2017  . Simple chronic bronchitis ([place]) 10/08/2017  . Uncontrolled hypertension 11/22/2018  . COPD (chronic obstructive pulmonary disease) ([place]) 03/22/2020  . COPD with acute exacerbation ([place]) 03/30/2020  . Elevated troponin level 03/30/2020  . Leukocytosis 03/30/2020  . Atrial fibrillation with rapid ventricular response ([place]) 04/14/2020  . Melena 04/14/2020  . Chest pain with high risk for cardiac etiology 11/09/2019  . Osteoarthritis of multiple joints 03/01/2019  . Primary osteoarthritis of both knees 09/07/2018  . Abnormality of gait 05/02/2020   Resolved Ambulatory Problems    Diagnosis Date Noted  . Atherosclerosis of coronary artery 11/15/2014  . Chronic pain 11/15/2014  . Diverticulitis of colon with perforation 11/15/2014  . Heart attack ([place]) 11/16/2014  . Flu vaccine need 03/02/2015  . Controlled type 2 diabetes mellitus without complication ([place]) 98/33/8250   Past Medical History:  Diagnosis Date  . A-fib ([place])   . Allergy   . Diabetes mellitus without complication ([place])   . GERD (gastroesophageal reflux disease)   . Hyperlipidemia   . Hypertension   . Oxygen dependent   . Stroke [facility])    SOCIAL HX:  Social History   Tobacco Use  . Smoking status: Former Smoker    Packs/day: 1.00    Years: 30.00    Pack years: 30.00    Quit date: 11/30/1999     Years since quitting: 20.5  . Smokeless tobacco: Never Used  Substance Use Topics  . Alcohol use: No   FAMILY HX:  Family History  Problem Relation Age of Onset  . Breast cancer Mother   . Pancreatitis Father   . Breast cancer Sister   . Lung cancer Brother   . Melanoma Brother   . Throat cancer Brother       ALLERGIES:  Allergies  Allergen Reactions  . Diphenhydramine Rash    AGITATION/DELIRIUM  . Metformin Diarrhea  . Oxybutynin Other (See Comments)    dizzy  . Amlodipine Rash  . Ciprofloxacin Rash  . Lisinopril Rash  . Penicillins Rash    Family states was a "long time ago"  . Saxagliptin Rash  . Sulfamethoxazole-Trimethoprim Rash     PERTINENT MEDICATIONS:  Outpatient Encounter Medications as of 06/28/2020  Medication Sig  . acetaminophen (TYLENOL) 500 MG tablet Take 500-1,000 mg by mouth every 6 (six) hours as needed for mild pain or moderate pain.  Marland Kitchen ALPRAZolam (XANAX) 0.25 MG tablet TAKE ONE TAB PO QHS FOR SLEEP (Patient taking differently: Take 0.25 mg by mouth at bedtime. )  . amiodarone (PACERONE) 200 MG tablet Take 1  tablet (200 mg total) by mouth daily.  Marland Kitchen aspirin EC 81 MG EC tablet Take 1 tablet (81 mg total) by mouth daily.  . Cholecalciferol (VITAMIN D3) 5000 units TABS Take 5,000 Units by mouth daily.  Marland Kitchen diltiazem (CARDIZEM) 60 MG tablet Take 1 tablet (60 mg total) by mouth 3 (three) times daily as needed (Take if your HR is greater than 120 with palpitations).  . esomeprazole (NEXIUM) 40 MG capsule Take 1 capsule (40 mg total) by mouth daily.  . ferrous sulfate 325 (65 FE) MG tablet Take 325 mg by mouth daily with breakfast.  . gabapentin (NEURONTIN) 100 MG capsule Take 100 mg by mouth at bedtime.   Marland Kitchen HUMULIN 70/30 KWIKPEN (70-30) 100 UNIT/ML KwikPen Inject 35 Units into the skin 2 (two) times daily.  . hydrOXYzine (ATARAX/VISTARIL) 25 MG tablet Take 25 mg by mouth 4 (four) times daily as needed for itching.  . isosorbide mononitrate (IMDUR) 30 MG 24 hr  tablet Take 30 mg by mouth daily.  Marland Kitchen levothyroxine (SYNTHROID) 50 MCG tablet Take 50 mcg by mouth daily before breakfast.  . metoprolol succinate (TOPROL-XL) 100 MG 24 hr tablet Take 1 tablet (100 mg total) by mouth daily. Take with or immediately following a meal.  . nitroGLYCERIN (NITROSTAT) 0.4 MG SL tablet Place 0.4 mg under the tongue every 5 (five) minutes as needed for chest pain.   . SYMBICORT 80-4.5 MCG/ACT inhaler INHALE 2 PUFFS INTO THE LUNGS TWICE DAILY  . traZODone (DESYREL) 50 MG tablet Take 50 mg by mouth at bedtime as needed for sleep.   . trospium (SANCTURA) 20 MG tablet Take 1 tablet (20 mg total) by mouth daily.  . VENTOLIN HFA 108 (90 Base) MCG/ACT inhaler Inhale 2 puffs into the lungs every 6 (six) hours as needed for wheezing or shortness of breath.   . vitamin B-12 (CYANOCOBALAMIN) 1000 MCG tablet Take 1,000 mcg by mouth daily.   No facility-administered encounter medications on file as of 06/28/2020.    Objective: ROS    General: NAD EYES: denies vision changes, recent Eye exam ENMT: denies dysphagia Cardiovascular: denies chest pain Pulmonary: denies  cough, denies increased SOB, some DOE Abdomen: endorses fair appetite, endorses constipation, endorses continence of bowel GU: denies dysuria, endorses continence of urine MSK:  endorses ROM limitations, no falls reported Skin: denies rashes or wounds Neurological: endorses weakness, denies pain, denies insomnia Psych: Endorses positive mood Heme/lymph/immuno: denies bruises, abnormal bleeding  Physical Exam: Current and past weights:reports 167 lbs. States several pound loss. Constitutional: NAD General: frail appearing, overweight EYES: anicteric sclera, lids intact, no discharge  ENMT: hard of hearing,oral mucous membranes moist, dentition intact CV:  no LE edema Pulmonary:  no increased work of breathing, no cough, no audible wheezes, room air Abdomen: intake 50%,no ascites GU: deferred MSK: moderate   sarcopenia, decreased ROM in all extremities, no contractures of LE,  Ambulatory, fall risk Skin: warm and dry, no rashes or wounds on visible skin Neuro: Generalized weakness, slight cognitive impairment Psych: non-anxious affect, A and O x 2-3 Hem/lymph/immuno: no widespread bruising   Thank you for the opportunity to participate in the care of Ms. Mumpower.  The palliative care team will continue to follow. Please call our office at [phone] if we can be of additional assistance.  Jason Coop, NP , DNP, MPH, AGPCNP-BC, ACHPN  COVID-19 PATIENT SCREENING TOOL  Person answering questions: ____________self______ _____   1.  Is the patient or any family member in the home showing  any signs or symptoms regarding respiratory infection?               Person with Symptom- __________NA_________________  a. Fever                                                                          Yes___ No___          ___________________  b. Shortness of breath                                                    Yes___ No___          ___________________ c. Cough/congestion                                       Yes___  No___         ___________________ d. Body aches/pains                                                         Yes___ No___        ____________________ e. Gastrointestinal symptoms (diarrhea, nausea)           Yes___ No___        ____________________  2. Within the past 14 days, has anyone living in the home had any contact with someone with or under investigation for COVID-19?    Yes___ No_X_   Person __________________

## 2020-07-02 ENCOUNTER — Inpatient Hospital Stay
Admit: 2020-07-02 | Discharge: 2020-07-02 | Disposition: A | Discharge status: Home / Self Care | Payer: Medicare Other | Attending: Cardiovascular Disease | Admitting: Cardiovascular Disease

## 2020-07-02 ENCOUNTER — Encounter
Admission: EM | Disposition: A | Discharge status: Home / Self Care | Payer: Self-pay | Source: Home / Self Care | Attending: Internal Medicine

## 2020-07-02 ENCOUNTER — Emergency Department: Payer: Medicare Other

## 2020-07-02 ENCOUNTER — Encounter: Payer: Self-pay | Admitting: Emergency Medicine

## 2020-07-02 ENCOUNTER — Inpatient Hospital Stay
Admission: EM | Admit: 2020-07-02 | Discharge: 2020-07-04 | DRG: 247 | Disposition: A | Discharge status: Home / Self Care | Payer: Medicare Other | Attending: Internal Medicine | Admitting: Internal Medicine

## 2020-07-02 DIAGNOSIS — I25110 Atherosclerotic heart disease of native coronary artery with unstable angina pectoris: Secondary | ICD-10-CM

## 2020-07-02 DIAGNOSIS — Z9981 Dependence on supplemental oxygen: Secondary | ICD-10-CM | POA: Diagnosis not present

## 2020-07-02 DIAGNOSIS — I5022 Chronic systolic (congestive) heart failure: Secondary | ICD-10-CM | POA: Diagnosis present

## 2020-07-02 DIAGNOSIS — I2129 ST elevation (STEMI) myocardial infarction involving other sites: Secondary | ICD-10-CM | POA: Diagnosis present

## 2020-07-02 DIAGNOSIS — I480 Paroxysmal atrial fibrillation: Secondary | ICD-10-CM | POA: Diagnosis present

## 2020-07-02 DIAGNOSIS — Z794 Long term (current) use of insulin: Secondary | ICD-10-CM | POA: Diagnosis not present

## 2020-07-02 DIAGNOSIS — Z7982 Long term (current) use of aspirin: Secondary | ICD-10-CM | POA: Diagnosis not present

## 2020-07-02 DIAGNOSIS — Z7951 Long term (current) use of inhaled steroids: Secondary | ICD-10-CM | POA: Diagnosis not present

## 2020-07-02 DIAGNOSIS — I429 Cardiomyopathy, unspecified: Secondary | ICD-10-CM | POA: Diagnosis present

## 2020-07-02 DIAGNOSIS — I2510 Atherosclerotic heart disease of native coronary artery without angina pectoris: Secondary | ICD-10-CM | POA: Diagnosis present

## 2020-07-02 DIAGNOSIS — Z79890 Hormone replacement therapy (postmenopausal): Secondary | ICD-10-CM

## 2020-07-02 DIAGNOSIS — E119 Type 2 diabetes mellitus without complications: Secondary | ICD-10-CM | POA: Diagnosis present

## 2020-07-02 DIAGNOSIS — K219 Gastro-esophageal reflux disease without esophagitis: Secondary | ICD-10-CM | POA: Diagnosis present

## 2020-07-02 DIAGNOSIS — I2582 Chronic total occlusion of coronary artery: Secondary | ICD-10-CM | POA: Diagnosis present

## 2020-07-02 DIAGNOSIS — E039 Hypothyroidism, unspecified: Secondary | ICD-10-CM | POA: Diagnosis present

## 2020-07-02 DIAGNOSIS — Z880 Allergy status to penicillin: Secondary | ICD-10-CM

## 2020-07-02 DIAGNOSIS — Z20822 Contact with and (suspected) exposure to covid-19: Secondary | ICD-10-CM | POA: Diagnosis present

## 2020-07-02 DIAGNOSIS — M170 Bilateral primary osteoarthritis of knee: Secondary | ICD-10-CM | POA: Diagnosis present

## 2020-07-02 DIAGNOSIS — Z66 Do not resuscitate: Secondary | ICD-10-CM | POA: Diagnosis present

## 2020-07-02 DIAGNOSIS — F419 Anxiety disorder, unspecified: Secondary | ICD-10-CM | POA: Diagnosis present

## 2020-07-02 DIAGNOSIS — E785 Hyperlipidemia, unspecified: Secondary | ICD-10-CM | POA: Diagnosis present

## 2020-07-02 DIAGNOSIS — Z79899 Other long term (current) drug therapy: Secondary | ICD-10-CM

## 2020-07-02 DIAGNOSIS — R54 Age-related physical debility: Secondary | ICD-10-CM | POA: Diagnosis present

## 2020-07-02 DIAGNOSIS — I213 ST elevation (STEMI) myocardial infarction of unspecified site: Secondary | ICD-10-CM

## 2020-07-02 DIAGNOSIS — Z955 Presence of coronary angioplasty implant and graft: Secondary | ICD-10-CM

## 2020-07-02 DIAGNOSIS — I2121 ST elevation (STEMI) myocardial infarction involving left circumflex coronary artery: Secondary | ICD-10-CM

## 2020-07-02 DIAGNOSIS — Z882 Allergy status to sulfonamides status: Secondary | ICD-10-CM

## 2020-07-02 DIAGNOSIS — I110 Hypertensive heart disease with heart failure: Secondary | ICD-10-CM | POA: Diagnosis present

## 2020-07-02 DIAGNOSIS — Z8673 Personal history of transient ischemic attack (TIA), and cerebral infarction without residual deficits: Secondary | ICD-10-CM

## 2020-07-02 DIAGNOSIS — I252 Old myocardial infarction: Secondary | ICD-10-CM

## 2020-07-02 DIAGNOSIS — J449 Chronic obstructive pulmonary disease, unspecified: Secondary | ICD-10-CM | POA: Diagnosis present

## 2020-07-02 DIAGNOSIS — Z87891 Personal history of nicotine dependence: Secondary | ICD-10-CM

## 2020-07-02 DIAGNOSIS — Z888 Allergy status to other drugs, medicaments and biological substances status: Secondary | ICD-10-CM

## 2020-07-02 HISTORY — PX: CORONARY/GRAFT ACUTE MI REVASCULARIZATION: CATH118305

## 2020-07-02 HISTORY — PX: LEFT HEART CATH AND CORONARY ANGIOGRAPHY: CATH118249

## 2020-07-02 LAB — CBC WITH DIFFERENTIAL/PLATELET
Abs Immature Granulocytes: 0.08 10*3/uL — ABNORMAL HIGH (ref 0.00–0.07)
Basophils Absolute: 0.1 10*3/uL (ref 0.0–0.1)
Basophils Relative: 1 %
Eosinophils Absolute: 0.7 10*3/uL — ABNORMAL HIGH (ref 0.0–0.5)
Eosinophils Relative: 7 %
HCT: 39.5 % (ref 36.0–46.0)
Hemoglobin: 11.7 g/dL — ABNORMAL LOW (ref 12.0–15.0)
Immature Granulocytes: 1 %
Lymphocytes Relative: 16 %
Lymphs Abs: 1.7 10*3/uL (ref 0.7–4.0)
MCH: 23.6 pg — ABNORMAL LOW (ref 26.0–34.0)
MCHC: 29.6 g/dL — ABNORMAL LOW (ref 30.0–36.0)
MCV: 79.8 fL — ABNORMAL LOW (ref 80.0–100.0)
Monocytes Absolute: 0.9 10*3/uL (ref 0.1–1.0)
Monocytes Relative: 9 %
Neutro Abs: 7 10*3/uL (ref 1.7–7.7)
Neutrophils Relative %: 66 %
Platelets: 352 10*3/uL (ref 150–400)
RBC: 4.95 MIL/uL (ref 3.87–5.11)
RDW: 14.8 % (ref 11.5–15.5)
WBC: 10.5 10*3/uL (ref 4.0–10.5)
nRBC: 0 % (ref 0.0–0.2)

## 2020-07-02 LAB — POCT ACTIVATED CLOTTING TIME: Activated Clotting Time: 255 seconds

## 2020-07-02 LAB — CBC
HCT: 41.6 % (ref 36.0–46.0)
Hemoglobin: 12.2 g/dL (ref 12.0–15.0)
MCH: 23.4 pg — ABNORMAL LOW (ref 26.0–34.0)
MCHC: 29.3 g/dL — ABNORMAL LOW (ref 30.0–36.0)
MCV: 79.7 fL — ABNORMAL LOW (ref 80.0–100.0)
Platelets: 341 10*3/uL (ref 150–400)
RBC: 5.22 MIL/uL — ABNORMAL HIGH (ref 3.87–5.11)
RDW: 14.8 % (ref 11.5–15.5)
WBC: 13 10*3/uL — ABNORMAL HIGH (ref 4.0–10.5)
nRBC: 0 % (ref 0.0–0.2)

## 2020-07-02 LAB — BASIC METABOLIC PANEL
Anion gap: 11 (ref 5–15)
BUN: 14 mg/dL (ref 8–23)
CO2: 26 mmol/L (ref 22–32)
Calcium: 9.4 mg/dL (ref 8.9–10.3)
Chloride: 102 mmol/L (ref 98–111)
Creatinine, Ser: 0.95 mg/dL (ref 0.44–1.00)
GFR, Estimated: 57 mL/min — ABNORMAL LOW (ref >60)
Glucose, Bld: 101 mg/dL — ABNORMAL HIGH (ref 70–99)
Potassium: 4 mmol/L (ref 3.5–5.1)
Sodium: 139 mmol/L (ref 135–145)

## 2020-07-02 LAB — TROPONIN I (HIGH SENSITIVITY)
Troponin I (High Sensitivity): 21 ng/L — ABNORMAL HIGH (ref <18)
Troponin I (High Sensitivity): >27000 ng/L (ref <18)

## 2020-07-02 LAB — SARS CORONAVIRUS 2 BY RT PCR (HOSPITAL ORDER, PERFORMED IN ~~LOC~~ HOSPITAL LAB): SARS Coronavirus 2: NEGATIVE

## 2020-07-02 LAB — CREATININE, SERUM
Creatinine, Ser: 1.05 mg/dL — ABNORMAL HIGH (ref 0.44–1.00)
GFR, Estimated: 51 mL/min — ABNORMAL LOW (ref >60)

## 2020-07-02 LAB — GLUCOSE, CAPILLARY
Glucose-Capillary: 99 mg/dL (ref 70–99)
Glucose-Capillary: 136 mg/dL — ABNORMAL HIGH (ref 70–99)

## 2020-07-02 LAB — MRSA PCR SCREENING: MRSA by PCR: NEGATIVE

## 2020-07-02 SURGERY — CORONARY/GRAFT ACUTE MI REVASCULARIZATION
Anesthesia: Moderate Sedation

## 2020-07-02 MED ORDER — INSULIN ASPART PROT & ASPART (70-30 MIX) 100 UNIT/ML ~~LOC~~ SUSP
35.0000 [IU] | Freq: Two times a day (BID) | SUBCUTANEOUS | Status: DC
  Administered 2020-07-03 – 2020-07-04 (×3): 35 [IU] via SUBCUTANEOUS
  Filled 2020-07-02 (×3): qty 10

## 2020-07-02 MED ORDER — MELATONIN 5 MG PO TABS
5.0000 mg | ORAL_TABLET | Freq: Every day | ORAL | Status: DC
  Administered 2020-07-02 – 2020-07-03 (×2): 5 mg via ORAL
  Filled 2020-07-02 (×2): qty 1

## 2020-07-02 MED ORDER — FERROUS SULFATE 325 (65 FE) MG PO TABS
325.0000 mg | ORAL_TABLET | Freq: Every day | ORAL | Status: DC
  Administered 2020-07-03 – 2020-07-04 (×2): 325 mg via ORAL
  Filled 2020-07-02 (×2): qty 1

## 2020-07-02 MED ORDER — METOPROLOL SUCCINATE ER 100 MG PO TB24
100.0000 mg | ORAL_TABLET | Freq: Every day | ORAL | Status: DC
  Administered 2020-07-03 – 2020-07-04 (×2): 100 mg via ORAL
  Filled 2020-07-02: qty 1
  Filled 2020-07-02 (×3): qty 2

## 2020-07-02 MED ORDER — NITROGLYCERIN 1 MG/10 ML FOR IR/CATH LAB
INTRA_ARTERIAL | Status: DC | PRN
  Administered 2020-07-02: 200 ug via INTRACORONARY

## 2020-07-02 MED ORDER — SODIUM CHLORIDE 0.9% FLUSH
3.0000 mL | INTRAVENOUS | Status: DC | PRN
  Administered 2020-07-02: 3 mL via INTRAVENOUS

## 2020-07-02 MED ORDER — ONDANSETRON HCL 4 MG/2ML IJ SOLN
4.0000 mg | Freq: Four times a day (QID) | INTRAMUSCULAR | Status: DC | PRN
  Administered 2020-07-02: 4 mg via INTRAVENOUS
  Filled 2020-07-02 (×2): qty 2

## 2020-07-02 MED ORDER — METOPROLOL TARTRATE 25 MG PO TABS
25.0000 mg | ORAL_TABLET | ORAL | Status: AC
  Administered 2020-07-02: 25 mg via ORAL
  Filled 2020-07-02: qty 1

## 2020-07-02 MED ORDER — AMIODARONE HCL 200 MG PO TABS
200.0000 mg | ORAL_TABLET | Freq: Every day | ORAL | Status: DC
  Administered 2020-07-02 – 2020-07-03 (×2): 200 mg via ORAL
  Filled 2020-07-02 (×3): qty 1

## 2020-07-02 MED ORDER — HEPARIN SODIUM (PORCINE) 1000 UNIT/ML IJ SOLN
INTRAMUSCULAR | Status: AC
  Filled 2020-07-02: qty 1

## 2020-07-02 MED ORDER — VERAPAMIL HCL 2.5 MG/ML IV SOLN
INTRAVENOUS | Status: AC
  Filled 2020-07-02: qty 2

## 2020-07-02 MED ORDER — ALBUTEROL SULFATE HFA 108 (90 BASE) MCG/ACT IN AERS
2.0000 | INHALATION_SPRAY | Freq: Four times a day (QID) | RESPIRATORY_TRACT | Status: DC | PRN
  Filled 2020-07-02 (×2): qty 6.7

## 2020-07-02 MED ORDER — NITROGLYCERIN 0.4 MG SL SUBL: 0.4000 mg | SUBLINGUAL_TABLET | SUBLINGUAL | Status: DC | PRN

## 2020-07-02 MED ORDER — ONDANSETRON HCL 4 MG/2ML IJ SOLN: 4.0000 mg | Freq: Four times a day (QID) | INTRAMUSCULAR | Status: DC | PRN

## 2020-07-02 MED ORDER — HEPARIN (PORCINE) IN NACL 1000-0.9 UT/500ML-% IV SOLN
INTRAVENOUS | Status: DC | PRN
  Administered 2020-07-02 (×2): 500 mL

## 2020-07-02 MED ORDER — ATORVASTATIN CALCIUM 20 MG PO TABS
40.0000 mg | ORAL_TABLET | Freq: Every day | ORAL | Status: DC
  Administered 2020-07-02 – 2020-07-03 (×2): 40 mg via ORAL
  Filled 2020-07-02 (×2): qty 2

## 2020-07-02 MED ORDER — IOHEXOL 300 MG/ML  SOLN
INTRAMUSCULAR | Status: DC | PRN
  Administered 2020-07-02: 110 mL

## 2020-07-02 MED ORDER — HYDROXYZINE HCL 25 MG PO TABS
25.0000 mg | ORAL_TABLET | Freq: Four times a day (QID) | ORAL | Status: DC | PRN
  Filled 2020-07-02: qty 1

## 2020-07-02 MED ORDER — EPINEPHRINE 1 MG/10ML IJ SOSY
PREFILLED_SYRINGE | INTRAMUSCULAR | Status: AC
  Filled 2020-07-02: qty 30

## 2020-07-02 MED ORDER — ENOXAPARIN SODIUM 40 MG/0.4ML ~~LOC~~ SOLN
40.0000 mg | SUBCUTANEOUS | Status: DC
  Administered 2020-07-03: 40 mg via SUBCUTANEOUS
  Filled 2020-07-02 (×2): qty 0.4

## 2020-07-02 MED ORDER — HEPARIN (PORCINE) IN NACL 1000-0.9 UT/500ML-% IV SOLN
INTRAVENOUS | Status: AC
  Filled 2020-07-02: qty 1000

## 2020-07-02 MED ORDER — ASPIRIN EC 81 MG PO TBEC
81.0000 mg | DELAYED_RELEASE_TABLET | Freq: Every day | ORAL | Status: DC
  Administered 2020-07-03: 81 mg via ORAL
  Filled 2020-07-02 (×2): qty 1

## 2020-07-02 MED ORDER — MIDAZOLAM HCL 2 MG/2ML IJ SOLN
INTRAMUSCULAR | Status: AC
  Filled 2020-07-02: qty 2

## 2020-07-02 MED ORDER — TRAZODONE HCL 50 MG PO TABS: 50.0000 mg | ORAL_TABLET | Freq: Every evening | ORAL | Status: DC | PRN

## 2020-07-02 MED ORDER — DILTIAZEM HCL 30 MG PO TABS: 60.0000 mg | ORAL_TABLET | Freq: Three times a day (TID) | ORAL | Status: DC | PRN

## 2020-07-02 MED ORDER — MOMETASONE FURO-FORMOTEROL FUM 100-5 MCG/ACT IN AERO
2.0000 | INHALATION_SPRAY | Freq: Two times a day (BID) | RESPIRATORY_TRACT | Status: DC
  Administered 2020-07-02 – 2020-07-04 (×4): 2 via RESPIRATORY_TRACT
  Filled 2020-07-02: qty 8.8

## 2020-07-02 MED ORDER — HEPARIN SODIUM (PORCINE) 1000 UNIT/ML IJ SOLN
INTRAMUSCULAR | Status: DC | PRN
  Administered 2020-07-02: 4000 [IU] via INTRAVENOUS

## 2020-07-02 MED ORDER — FENTANYL CITRATE (PF) 100 MCG/2ML IJ SOLN
INTRAMUSCULAR | Status: AC
  Filled 2020-07-02: qty 2

## 2020-07-02 MED ORDER — CLOPIDOGREL BISULFATE 75 MG PO TABS
ORAL_TABLET | ORAL | Status: DC | PRN
  Administered 2020-07-02: 600 mg via ORAL

## 2020-07-02 MED ORDER — ACETAMINOPHEN 500 MG PO TABS
500.0000 mg | ORAL_TABLET | Freq: Four times a day (QID) | ORAL | Status: DC | PRN
  Administered 2020-07-03: 1000 mg via ORAL
  Filled 2020-07-02: qty 2

## 2020-07-02 MED ORDER — LISINOPRIL 20 MG PO TABS
20.0000 mg | ORAL_TABLET | ORAL | Status: AC
  Administered 2020-07-02: 20 mg via ORAL
  Filled 2020-07-02: qty 1

## 2020-07-02 MED ORDER — VITAMIN D 25 MCG (1000 UNIT) PO TABS
5000.0000 [IU] | ORAL_TABLET | Freq: Every day | ORAL | Status: DC
  Administered 2020-07-03 – 2020-07-04 (×2): 5000 [IU] via ORAL
  Filled 2020-07-02 (×2): qty 5

## 2020-07-02 MED ORDER — SODIUM CHLORIDE 0.9 % IV SOLN: 250.0000 mL | INTRAVENOUS | Status: DC | PRN

## 2020-07-02 MED ORDER — VERAPAMIL HCL 2.5 MG/ML IV SOLN
INTRAVENOUS | Status: DC | PRN
  Administered 2020-07-02: 2.5 mg via INTRA_ARTERIAL

## 2020-07-02 MED ORDER — ALPRAZOLAM 0.25 MG PO TABS
0.2500 mg | ORAL_TABLET | Freq: Every day | ORAL | Status: DC
  Administered 2020-07-03: 0.25 mg via ORAL
  Filled 2020-07-02: qty 1

## 2020-07-02 MED ORDER — CLOPIDOGREL BISULFATE 75 MG PO TABS
ORAL_TABLET | ORAL | Status: AC
  Filled 2020-07-02: qty 8

## 2020-07-02 MED ORDER — FUROSEMIDE 10 MG/ML IJ SOLN
INTRAMUSCULAR | Status: AC
  Filled 2020-07-02: qty 4

## 2020-07-02 MED ORDER — CHLORHEXIDINE GLUCONATE CLOTH 2 % EX PADS
6.0000 | MEDICATED_PAD | Freq: Every day | CUTANEOUS | Status: DC
  Administered 2020-07-02 – 2020-07-04 (×3): 6 via TOPICAL

## 2020-07-02 MED ORDER — GABAPENTIN 100 MG PO CAPS
100.0000 mg | ORAL_CAPSULE | Freq: Every day | ORAL | Status: DC
  Administered 2020-07-02 – 2020-07-03 (×2): 100 mg via ORAL
  Filled 2020-07-02 (×2): qty 1

## 2020-07-02 MED ORDER — PANTOPRAZOLE SODIUM 40 MG PO TBEC
40.0000 mg | DELAYED_RELEASE_TABLET | Freq: Every day | ORAL | Status: DC
  Administered 2020-07-02 – 2020-07-04 (×3): 40 mg via ORAL
  Filled 2020-07-02 (×3): qty 1

## 2020-07-02 MED ORDER — HEPARIN SODIUM (PORCINE) 5000 UNIT/ML IJ SOLN
4000.0000 [IU] | Freq: Once | INTRAMUSCULAR | Status: AC
  Administered 2020-07-02: 4000 [IU] via INTRAVENOUS
  Filled 2020-07-02: qty 1

## 2020-07-02 MED ORDER — CLOPIDOGREL BISULFATE 75 MG PO TABS
75.0000 mg | ORAL_TABLET | Freq: Every day | ORAL | Status: DC
  Administered 2020-07-03 – 2020-07-04 (×2): 75 mg via ORAL
  Filled 2020-07-02 (×2): qty 1

## 2020-07-02 MED ORDER — ISOSORBIDE MONONITRATE ER 30 MG PO TB24
30.0000 mg | ORAL_TABLET | Freq: Every day | ORAL | Status: DC
  Administered 2020-07-02 – 2020-07-04 (×3): 30 mg via ORAL
  Filled 2020-07-02 (×3): qty 1

## 2020-07-02 MED ORDER — ORAL CARE MOUTH RINSE
15.0000 mL | Freq: Two times a day (BID) | OROMUCOSAL | Status: DC
  Administered 2020-07-02 – 2020-07-04 (×4): 15 mL via OROMUCOSAL

## 2020-07-02 MED ORDER — SODIUM CHLORIDE 0.9% FLUSH
3.0000 mL | Freq: Two times a day (BID) | INTRAVENOUS | Status: DC
  Administered 2020-07-02 – 2020-07-04 (×4): 3 mL via INTRAVENOUS

## 2020-07-02 MED ORDER — LEVOTHYROXINE SODIUM 50 MCG PO TABS
50.0000 ug | ORAL_TABLET | Freq: Every day | ORAL | Status: DC
  Administered 2020-07-03 – 2020-07-04 (×2): 50 ug via ORAL
  Filled 2020-07-02 (×2): qty 1

## 2020-07-02 MED ORDER — VITAMIN B-12 1000 MCG PO TABS
1000.0000 ug | ORAL_TABLET | Freq: Every day | ORAL | Status: DC
  Administered 2020-07-03 – 2020-07-04 (×2): 1000 ug via ORAL
  Filled 2020-07-02 (×2): qty 1

## 2020-07-02 MED ORDER — FUROSEMIDE 10 MG/ML IJ SOLN
INTRAMUSCULAR | Status: DC | PRN
  Administered 2020-07-02: 20 mg via INTRAVENOUS

## 2020-07-02 SURGICAL SUPPLY — 15 items
BALLN EUPHORA RX 2.0X15 (BALLOONS) ×2
BALLOON EUPHORA RX 2.0X15 (BALLOONS) ×1 IMPLANT
CATH INFINITI 5FR JK (CATHETERS) ×2 IMPLANT
CATH LAUNCHER 5F EBU3.5 (CATHETERS) ×2 IMPLANT
CATH LAUNCHER 6FR EBU3.5 (CATHETERS) ×2 IMPLANT
DEVICE RAD TR BAND REGULAR (VASCULAR PRODUCTS) ×2 IMPLANT
GLIDESHEATH SLEND SS 6F .021 (SHEATH) ×2 IMPLANT
GUIDEWIRE INQWIRE 1.5J.035X260 (WIRE) ×1 IMPLANT
INQWIRE 1.5J .035X260CM (WIRE) ×2
KIT ENCORE 26 ADVANTAGE (KITS) ×2 IMPLANT
KIT MANI 3VAL PERCEP (MISCELLANEOUS) ×2 IMPLANT
PACK CARDIAC CATH (CUSTOM PROCEDURE TRAY) ×2 IMPLANT
STENT RESOLUTE ONYX 2.25X26 (Permanent Stent) ×2 IMPLANT
WIRE HITORQ VERSACORE ST 145CM (WIRE) ×2 IMPLANT
WIRE RUNTHROUGH .014X180CM (WIRE) ×2 IMPLANT

## 2020-07-02 NOTE — Consult Note (Signed)
[facility] Cardiology Consultation Note  Patient ID: Peggy [name], MRN: [id number], DOB/AGE: 08/19/1928 87 y.o. Admit date: 07/02/2020   Date of Consult: 07/02/2020 Primary Physician: Lauro Regulus, MD Primary Cardiologist: Paraschos  Chief Complaint:  Chief Complaint  Patient presents with  . Chest Pain   Reason for Consult: Myocardial infarction  HPI: 87 y.o. female with known coronary artery disease hypertension hyperlipidemia paroxysmal nonvalvular atrial fibrillation for which show the patient has been on appropriate medication management.  Patient had acute onset of significant chest discomfort substernal in nature and was seen in the emergency room.  At that time she had an EKG suggesting a lateral myocardial infarction with ST elevation.  She was taken to the Cath Lab and found to have moderate atherosclerosis of the left anterior descending artery but an occlusion of her obtuse marginal artery.  PCI and stent placement were performed without complication.  The patient now still has a little bit of chest discomfort in the center of her chest with EKG changes improving.  Troponin level peaked so far is 21.  Additionally the patient has had no evidence of oxygen requirement or congestive heart failure at this time  Past Medical History:  Diagnosis Date  . A-fib (HCC)   . Allergy   . Arthritis   . COPD (chronic obstructive pulmonary disease) (HCC)   . Diabetes mellitus without complication (HCC)   . GERD (gastroesophageal reflux disease)   . Hyperlipidemia   . Hypertension   . Oxygen dependent    2 liters  . Stroke [facility])    5 years ago per daughter  . TIA (transient ischemic attack)       Surgical History:  Past Surgical History:  Procedure Laterality Date  . ABDOMINAL HYSTERECTOMY  1970  . BACK SURGERY  2013  . colectomy Right 10/08/2013   Dr. Egbert Garibaldi  . CORONARY ANGIOPLASTY WITH STENT PLACEMENT       Home Meds: Prior to Admission medications   Medication Sig  Start Date End Date Taking? Authorizing Provider  acetaminophen (TYLENOL) 500 MG tablet Take 500-1,000 mg by mouth every 6 (six) hours as needed for mild pain or moderate pain.    [provider]  ALPRAZolam (XANAX) 0.25 MG tablet TAKE ONE TAB PO QHS FOR SLEEP Patient taking differently: Take 0.25 mg by mouth at bedtime.  04/12/20   Lyndon Code, MD  amiodarone (PACERONE) 200 MG tablet Take 1 tablet (200 mg total) by mouth daily. 04/30/20 05/30/20  Briant Cedar, MD  aspirin EC 81 MG EC tablet Take 1 tablet (81 mg total) by mouth daily. 03/09/19   Mayo, Allyn Kenner, MD  Cholecalciferol (VITAMIN D3) 5000 units TABS Take 5,000 Units by mouth daily.    [provider]  diltiazem (CARDIZEM) 60 MG tablet Take 1 tablet (60 mg total) by mouth 3 (three) times daily as needed (Take if your HR is greater than 120 with palpitations). 04/17/20 05/17/20  Briant Cedar, MD  esomeprazole (NEXIUM) 40 MG capsule Take 1 capsule (40 mg total) by mouth daily. 01/05/20   [name], NP  ferrous sulfate 325 (65 FE) MG tablet Take 325 mg by mouth daily with breakfast.    [provider]  gabapentin (NEURONTIN) 100 MG capsule Take 100 mg by mouth at bedtime.  11/23/19   [provider]  HUMULIN 70/30 KWIKPEN (70-30) 100 UNIT/ML KwikPen Inject 35 Units into the skin 2 (two) times daily. 04/01/20   Lurene Shadow, MD  hydrOXYzine (ATARAX/VISTARIL) 25 MG tablet Take 25 mg by mouth 4 (four) times daily as needed for itching. 06/05/17   [provider]  isosorbide mononitrate (IMDUR) 30 MG 24 hr tablet Take 30 mg by mouth daily. 11/09/19   [provider]  levothyroxine (SYNTHROID) 50 MCG tablet Take 50 mcg by mouth daily before breakfast.    [provider]  metoprolol succinate (TOPROL-XL) 100 MG 24 hr tablet Take 1 tablet (100 mg total) by mouth daily. Take with or immediately following a meal. 11/25/18   Jimmye Normanuma, Elizabeth Achieng, NP  nitroGLYCERIN  (NITROSTAT) 0.4 MG SL tablet Place 0.4 mg under the tongue every 5 (five) minutes as needed for chest pain.  09/22/19 09/21/20  [provider]  SYMBICORT 80-4.5 MCG/ACT inhaler INHALE 2 PUFFS INTO THE LUNGS TWICE DAILY 04/17/20   Lyndon CodeKhan, Fozia M, MD  traZODone (DESYREL) 50 MG tablet Take 50 mg by mouth at bedtime as needed for sleep.  03/07/20   [provider]  trospium (SANCTURA) 20 MG tablet Take 1 tablet (20 mg total) by mouth daily. 04/01/20   Lurene ShadowAyiku, Bernard, MD  VENTOLIN HFA 108 (90 Base) MCG/ACT inhaler Inhale 2 puffs into the lungs every 6 (six) hours as needed for wheezing or shortness of breath.     [provider]  vitamin B-12 (CYANOCOBALAMIN) 1000 MCG tablet Take 1,000 mcg by mouth daily.    [provider]    Inpatient Medications:  . ALPRAZolam  0.25 mg Oral QHS  . amiodarone  200 mg Oral Daily  . [START ON 07/03/2020] aspirin EC  81 mg Oral Daily  . atorvastatin  40 mg Oral Daily  . Chlorhexidine Gluconate Cloth  6 each Topical Daily  . [START ON 07/03/2020] cholecalciferol  5,000 Units Oral Daily  . [START ON 07/03/2020] clopidogrel  75 mg Oral Q breakfast  . [START ON 07/03/2020] enoxaparin (LOVENOX) injection  40 mg Subcutaneous Q24H  . [START ON 07/03/2020] ferrous sulfate  325 mg Oral Q breakfast  . gabapentin  100 mg Oral QHS  . insulin aspart protamine- aspart  35 Units Subcutaneous BID WC  . isosorbide mononitrate  30 mg Oral Daily  . [START ON 07/03/2020] levothyroxine  50 mcg Oral QAC breakfast  . mouth rinse  15 mL Mouth Rinse BID  . metoprolol succinate  100 mg Oral Daily  . mometasone-formoterol  2 puff Inhalation BID  . pantoprazole  40 mg Oral Daily  . sodium chloride flush  3 mL Intravenous Q12H  . [START ON 07/03/2020] vitamin B-12  1,000 mcg Oral Daily   . sodium chloride      Allergies:  Allergies  Allergen Reactions  . Diphenhydramine Rash    AGITATION/DELIRIUM  . Metformin Diarrhea  . Oxybutynin Other (See Comments)     dizzy  . Amlodipine Rash  . Ciprofloxacin Rash  . Lisinopril Rash  . Penicillins Rash    Family states was a "long time ago"  . Saxagliptin Rash  . Sulfamethoxazole-Trimethoprim Rash    Social History   Socioeconomic History  . Marital status: Widowed    Spouse name: Not on file  . Number of children: 3  . Years of education: H/S  . Highest education level: Not on file  Occupational History  . Occupation: Retired  Tobacco Use  . Smoking status: Former Smoker    Packs/day: 1.00    Years: 30.00    Pack years: 30.00    Quit date: 11/30/1999    Years since  quitting: 20.6  . Smokeless tobacco: Never Used  Vaping Use  . Vaping Use: Never used  Substance and Sexual Activity  . Alcohol use: No  . Drug use: No  . Sexual activity: Not on file  Other Topics Concern  . Not on file  Social History Narrative  . Not on file   Social Determinants of Health   Financial Resource Strain: Not on file  Food Insecurity: Not on file  Transportation Needs: Not on file  Physical Activity: Not on file  Stress: Not on file  Social Connections: Not on file  Intimate Partner Violence: Not on file     Family History  Problem Relation Age of Onset  . Breast cancer Mother   . Pancreatitis Father   . Breast cancer Sister   . Lung cancer Brother   . Melanoma Brother   . Throat cancer Brother      Review of Systems Positive for chest pain Negative for: General:  chills, fever, night sweats or weight changes.  Cardiovascular: PND orthopnea syncope dizziness  Dermatological skin lesions rashes Respiratory: Cough congestion Urologic: Frequent urination urination at night and hematuria Abdominal: negative for nausea, vomiting, diarrhea, bright red blood per rectum, melena, or hematemesis Neurologic: negative for visual changes, and/or hearing changes  All other systems reviewed and are otherwise negative except as noted above.  Labs: No results for input(s): CKTOTAL, CKMB, TROPONINI  in the last 72 hours. Lab Results  Component Value Date   WBC 13.0 (H) 07/02/2020   HGB 12.2 07/02/2020   HCT 41.6 07/02/2020   MCV 79.7 (L) 07/02/2020   PLT 341 07/02/2020    Recent Labs  Lab 07/02/20 1416  NA 139  K 4.0  CL 102  CO2 26  BUN 14  CREATININE 0.95  CALCIUM 9.4  GLUCOSE 101*   Lab Results  Component Value Date   CHOL 189 04/15/2020   HDL 35 (L) 04/15/2020   LDLCALC 91 04/15/2020   TRIG 314 (H) 04/15/2020   No results found for: DDIMER  Radiology/Studies:  CARDIAC CATHETERIZATION  Addendum Date: 07/02/2020    Ramus lesion is 100% stenosed.  Mid Cx to Dist Cx lesion is 60% stenosed.  Mid LAD lesion is 90% stenosed.  Prox RCA lesion is 30% stenosed.  Mid RCA lesion is 20% stenosed.  Dist RCA lesion is 50% stenosed.  A drug-eluting stent was successfully placed using a STENT RESOLUTE ONYX 2.25X26.  2nd Mrg-1 lesion is 100% stenosed.  Post intervention, there is a 0% residual stenosis.  2nd Mrg-2 lesion is 100% stenosed.  1.  Acute lateral ST elevation myocardial infarction due to an occluded OM 2.  There is also significant stenosis in the mid LAD which is heavily calcified.  The stent in the RCA are patent with mild in-stent restenosis. 2.  Left ventricular angiography was not performed.  We will obtain an echocardiogram. 3.  Severely elevated blood pressure and left ventricular end-diastolic pressure at 32 mmHg. 4.  Successful angioplasty and drug-eluting stent placement to OM 2.  The superior branch of this vessel seem to be occluded distally.  Distal distribution is overall small. Recommendations: Dual antiplatelet therapy for 12 months if tolerated. Aggressive treatment of risk factors. Patient had significant dyspnea and orthopnea by the end of the case and given significantly elevated LVEDP, she was given 1 dose of furosemide 20 mg IV.  Check an echocardiogram. The patient has severe disease in the mid LAD but I doubt that she will be  a candidate for staged  PCI and atherectomy given her frail status and comorbidities.   Result Date: 07/02/2020  Ramus lesion is 100% stenosed.  Mid Cx to Dist Cx lesion is 60% stenosed.  Mid LAD lesion is 90% stenosed.  Prox RCA lesion is 30% stenosed.  Mid RCA lesion is 20% stenosed.  Dist RCA lesion is 50% stenosed.  A drug-eluting stent was successfully placed using a STENT RESOLUTE ONYX 2.25X26.  2nd Mrg-1 lesion is 100% stenosed.  Post intervention, there is a 0% residual stenosis.  2nd Mrg-2 lesion is 100% stenosed.  1.  Acute lateral ST elevation myocardial infarction due to an occluded OM 2.  There is also significant stenosis in the mid LAD which is heavily calcified.  The stent in the RCA are patent with mild in-stent restenosis. 2.  Left ventricular angiography was not performed.  We will obtain an echocardiogram. 3.  Severely elevated blood pressure and left ventricular end-diastolic pressure at 32 mmHg. 4.  Successful angioplasty and drug-eluting stent placement to OM 2.  The superior branch of this vessel seem to be occluded distally.  Distal distribution is overall small. Recommendations: Dual antiplatelet therapy for 12 months if tolerated. Aggressive treatment of risk factors. The patient has severe disease in the mid LAD but I doubt that she will be a candidate for staged PCI and atherectomy given her frail status and comorbidities.   EKG: Normal sinus rhythm with lateral ST elevation myocardial infarction  Weights: Filed Weights   07/02/20 1413  Weight: 76 kg     Physical Exam: Blood pressure (!) 172/77, pulse 68, temperature (!) 97.5 F (36.4 C), resp. rate (!) 25, height 5\' 2"  (1.575 m), weight 76 kg, SpO2 100 %. Body mass index is 30.65 kg/m. General: Well developed, well nourished, in no acute distress. Head eyes ears nose throat: Normocephalic, atraumatic, sclera non-icteric, no xanthomas, nares are without discharge. No apparent thyromegaly and/or mass  Lungs: Normal respiratory  effort.  no wheezes, no rales, no rhonchi.  Heart: RRR with normal S1 S2. no murmur gallop, no rub, PMI is normal size and placement, carotid upstroke normal without bruit, jugular venous pressure is normal Abdomen: Soft, non-tender, non-distended with normoactive bowel sounds. No hepatomegaly. No rebound/guarding. No obvious abdominal masses. Abdominal aorta is normal size without bruit Extremities: Ace edema. no cyanosis, no clubbing, no ulcers  Peripheral : 2+ bilateral upper extremity pulses, 2+ bilateral femoral pulses, 2+ bilateral dorsal pedal pulse Neuro: Alert and oriented. No facial asymmetry. No focal deficit. Moves all extremities spontaneously. Musculoskeletal: Normal muscle tone without kyphosis Psych:  Responds to questions appropriately with a normal affect.    Assessment: 87 year old female with acute ST elevation myocardial infarction with obtuse marginal occlusion now PCI and stent placement with hypertension hyperlipidemia paroxysmal nonvalvular atrial fibrillation  Plan: 1.  Dual antiplatelet therapy for acute lateral myocardial infarction and occlusion of obtuse marginal 1 now successfully with PCI and stent placement 2.  Continue beta-blocker for acute myocardial infarction isosorbide for any further chest discomfort and continued stenosis of left anterior descending artery 3.  Echocardiogram for LV systolic dysfunction valvular heart disease with adjustments of medication thereof as necessary 4.  High intensity cholesterol therapy with atorvastatin 5.  Amiodarone for maintenance of normal sinus rhythm without change in dosage 6.  No addition of anticoagulation at this time due to concerns of bleeding complications that she had in the past with anticoagulation and currently maintaining normal sinus rhythm.  Will consider reassessment of this  issue as an outpatient  Signed, Lamar Blinks M.D. [facility] Cardiology 07/02/2020, 5:40 PM

## 2020-07-02 NOTE — ED Provider Notes (Signed)
[facility] Emergency Department Provider Note   ____________________________________________   Event Date/Time   First MD Initiated Contact with Patient 07/02/20 1416     (approximate)  I have reviewed the triage vital signs and the nursing notes.   HISTORY  Chief Complaint Chest Pain    HPI Peggy [name] is a 87 y.o. female with past medical history of hypertension, hyperlipidemia, COPD on 2 L, atrial fibrillation, and stroke who presents to the ED complaining of chest pain.  Patient reports that she developed dull aching pain in the center of her chest about 30 minutes prior to arrival.  Pain has been constant, although seems to be easing off gradually following dose of aspirin and nitroglycerin.  She denies any associated shortness of breath, has not had any fevers or cough recently.  Initial EKG concerning for ST elevation MI and code STEMI called prior to arrival.        Past Medical History:  Diagnosis Date  . A-fib (HCC)   . Allergy   . Arthritis   . COPD (chronic obstructive pulmonary disease) (HCC)   . Diabetes mellitus without complication (HCC)   . GERD (gastroesophageal reflux disease)   . Hyperlipidemia   . Hypertension   . Oxygen dependent    2 liters  . Stroke [facility])    5 years ago per daughter  . TIA (transient ischemic attack)     Patient Active Problem List   Diagnosis Date Noted  . ST elevation myocardial infarction (STEMI) of lateral wall (HCC) 07/02/2020  . Abnormality of gait 05/02/2020  . Atrial fibrillation with rapid ventricular response (HCC) 04/14/2020  . Melena 04/14/2020  . COPD with acute exacerbation (HCC) 03/30/2020  . Elevated troponin level 03/30/2020  . Leukocytosis 03/30/2020  . COPD (chronic obstructive pulmonary disease) (HCC) 03/22/2020  . Chest pain with high risk for cardiac etiology 11/09/2019  . Osteoarthritis of multiple joints 03/01/2019  . Uncontrolled hypertension 11/22/2018  . Primary  osteoarthritis of both knees 09/07/2018  . Chronic respiratory failure with hypoxia (HCC) 10/08/2017  . Simple chronic bronchitis (HCC) 10/08/2017  . SOB (shortness of breath) 09/24/2017  . History of stroke 08/17/2017  . Acute respiratory failure with hypoxia and hypercarbia (HCC) 08/07/2017  . Respiratory failure (HCC) 08/07/2017  . History of GI bleed 07/07/2017  . Atrial fibrillation with RVR (HCC) 06/30/2017  . Bilateral carotid artery disease (HCC) 04/14/2017  . Healthcare maintenance 04/14/2017  . Dizziness 04/07/2017  . 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 TIA (transient ischemic attack) 12/10/2016  . CVA (cerebral vascular accident) (HCC) 12/09/2016  . Bilateral arm pain 11/27/2016  . Myocardial infarction (HCC) 07/30/2015  . Urinary frequency 02/09/2015  . RUQ abdominal pain 01/26/2015  . Shortness of breath 01/26/2015  . Anxiety 01/26/2015  . Chronic pruritus 12/21/2014  . Cerebral vascular accident (HCC) 11/16/2014  . Chronic low back pain 11/16/2014  . Adult hypothyroidism 11/15/2014  . Allergic rhinitis 11/15/2014  . Body mass index (BMI) of 28.0-28.9 in adult 11/15/2014  . Arthritis 11/15/2014  . Cramps of lower extremity 11/15/2014  . Essential (primary) hypertension 11/15/2014  . Acid reflux 11/15/2014  . Hypercholesteremia 11/15/2014  . Cannot sleep 11/15/2014  . Psoriasis 11/15/2014  . Itch of skin 11/15/2014  . Restless leg 11/15/2014  . Type 2 diabetes mellitus with other specified complication (HCC) 11/15/2014  . Breath shortness 11/15/2014  . Dermatitis due to unknown cause 04/10/2014  . CAD (coronary artery disease)  02/20/2014  . AF (paroxysmal atrial fibrillation) (HCC) 02/20/2014  . Temporary cerebral vascular dysfunction 02/20/2014  . DD (diverticular disease) 10/05/2013    Past Surgical History:  Procedure Laterality Date  . ABDOMINAL HYSTERECTOMY  1970  . BACK SURGERY  2013  . colectomy Right 10/08/2013   Dr. Egbert Garibaldi   . CORONARY ANGIOPLASTY WITH STENT PLACEMENT      Prior to Admission medications   Medication Sig Start Date End Date Taking? Authorizing Provider  acetaminophen (TYLENOL) 500 MG tablet Take 500-1,000 mg by mouth every 6 (six) hours as needed for mild pain or moderate pain.    [provider]  ALPRAZolam (XANAX) 0.25 MG tablet TAKE ONE TAB PO QHS FOR SLEEP Patient taking differently: Take 0.25 mg by mouth at bedtime.  04/12/20   Lyndon Code, MD  amiodarone (PACERONE) 200 MG tablet Take 1 tablet (200 mg total) by mouth daily. 04/30/20 05/30/20  Briant Cedar, MD  aspirin EC 81 MG EC tablet Take 1 tablet (81 mg total) by mouth daily. 03/09/19   Mayo, Allyn Kenner, MD  Cholecalciferol (VITAMIN D3) 5000 units TABS Take 5,000 Units by mouth daily.    [provider]  diltiazem (CARDIZEM) 60 MG tablet Take 1 tablet (60 mg total) by mouth 3 (three) times daily as needed (Take if your HR is greater than 120 with palpitations). 04/17/20 05/17/20  Briant Cedar, MD  esomeprazole (NEXIUM) 40 MG capsule Take 1 capsule (40 mg total) by mouth daily. 01/05/20   [name], NP  ferrous sulfate 325 (65 FE) MG tablet Take 325 mg by mouth daily with breakfast.    [provider]  gabapentin (NEURONTIN) 100 MG capsule Take 100 mg by mouth at bedtime.  11/23/19   [provider]  HUMULIN 70/30 KWIKPEN (70-30) 100 UNIT/ML KwikPen Inject 35 Units into the skin 2 (two) times daily. 04/01/20   Lurene Shadow, MD  hydrOXYzine (ATARAX/VISTARIL) 25 MG tablet Take 25 mg by mouth 4 (four) times daily as needed for itching. 06/05/17   [provider]  isosorbide mononitrate (IMDUR) 30 MG 24 hr tablet Take 30 mg by mouth daily. 11/09/19   [provider]  levothyroxine (SYNTHROID) 50 MCG tablet Take 50 mcg by mouth daily before breakfast.    [provider]  metoprolol succinate (TOPROL-XL) 100 MG 24 hr tablet Take 1 tablet (100 mg total) by mouth daily.  Take with or immediately following a meal. 11/25/18   Jimmye Norman, NP  nitroGLYCERIN (NITROSTAT) 0.4 MG SL tablet Place 0.4 mg under the tongue every 5 (five) minutes as needed for chest pain.  09/22/19 09/21/20  [provider]  SYMBICORT 80-4.5 MCG/ACT inhaler INHALE 2 PUFFS INTO THE LUNGS TWICE DAILY 04/17/20   Lyndon Code, MD  traZODone (DESYREL) 50 MG tablet Take 50 mg by mouth at bedtime as needed for sleep.  03/07/20   [provider]  trospium (SANCTURA) 20 MG tablet Take 1 tablet (20 mg total) by mouth daily. 04/01/20   Lurene Shadow, MD  VENTOLIN HFA 108 (90 Base) MCG/ACT inhaler Inhale 2 puffs into the lungs every 6 (six) hours as needed for wheezing or shortness of breath.     [provider]  vitamin B-12 (CYANOCOBALAMIN) 1000 MCG tablet Take 1,000 mcg by mouth daily.    [provider]    Allergies Diphenhydramine, Metformin, Oxybutynin, Amlodipine, Ciprofloxacin, Lisinopril, Penicillins, Saxagliptin, and Sulfamethoxazole-trimethoprim  Family History  Problem Relation Age of Onset  .  Breast cancer Mother   . Pancreatitis Father   . Breast cancer Sister   . Lung cancer Brother   . Melanoma Brother   . Throat cancer Brother     Social History Social History   Tobacco Use  . Smoking status: Former Smoker    Packs/day: 1.00    Years: 30.00    Pack years: 30.00    Quit date: 11/30/1999    Years since quitting: 20.6  . Smokeless tobacco: Never Used  Vaping Use  . Vaping Use: Never used  Substance Use Topics  . Alcohol use: No  . Drug use: No    Review of Systems  Constitutional: No fever/chills Eyes: No visual changes. ENT: No sore throat. Cardiovascular: Positive for chest pain. Respiratory: Denies shortness of breath. Gastrointestinal: No abdominal pain.  No nausea, no vomiting.  No diarrhea.  No constipation. Genitourinary: Negative for dysuria. Musculoskeletal: Negative for back pain. Skin: Negative for  rash. Neurological: Negative for headaches, focal weakness or numbness.  ____________________________________________   PHYSICAL EXAM:  VITAL SIGNS: ED Triage Vitals  Enc Vitals Group     BP --      Pulse Rate 07/02/20 1412 68     Resp 07/02/20 1412 18     Temp --      Temp src --      SpO2 07/02/20 1404 99 %     Weight 07/02/20 1413 167 lb 8.8 oz (76 kg)     Height 07/02/20 1413 5\' 2"  (1.575 m)     Head Circumference --      Peak Flow --      Pain Score 07/02/20 1413 7     Pain Loc --      Pain Edu? --      Excl. in GC? --     Constitutional: Alert and oriented. Eyes: Conjunctivae are normal. Head: Atraumatic. Nose: No congestion/rhinnorhea. Mouth/Throat: Mucous membranes are moist. Neck: Normal ROM Cardiovascular: Normal rate, regular rhythm. Grossly normal heart sounds.  2+ radial pulses bilaterally. Respiratory: Normal respiratory effort.  No retractions. Lungs CTAB.  No chest wall tenderness to palpation. Gastrointestinal: Soft and nontender. No distention. Genitourinary: deferred Musculoskeletal: No lower extremity tenderness nor edema. Neurologic:  Normal speech and language. No gross focal neurologic deficits are appreciated. Skin:  Skin is warm, dry and intact. No rash noted. Psychiatric: Mood and affect are normal. Speech and behavior are normal.  ____________________________________________   LABS (all labs ordered are listed, but only abnormal results are displayed)  Labs Reviewed  CBC WITH DIFFERENTIAL/PLATELET - Abnormal; Notable for the following components:      Result Value   Hemoglobin 11.7 (*)    MCV 79.8 (*)    MCH 23.6 (*)    MCHC 29.6 (*)    Eosinophils Absolute 0.7 (*)    Abs Immature Granulocytes 0.08 (*)    All other components within normal limits  BASIC METABOLIC PANEL - Abnormal; Notable for the following components:   Glucose, Bld 101 (*)    GFR, Estimated 57 (*)    All other components within normal limits  TROPONIN I (HIGH  SENSITIVITY) - Abnormal; Notable for the following components:   Troponin I (High Sensitivity) 21 (*)    All other components within normal limits  SARS CORONAVIRUS 2 BY RT PCR (HOSPITAL ORDER, PERFORMED IN [facility])   ____________________________________________  EKG  ED ECG REPORT I, 07/04/20, the attending physician, personally viewed and interpreted this ECG.   Date:  07/02/2020  EKG Time: 14:12  Rate: 67  Rhythm: normal sinus rhythm  Axis: LAD  Intervals:none  ST&T Change: Lateral ST elevation   PROCEDURES  Procedure(s) performed (including Critical Care):  .Critical Care Performed by: Chesley Noon, MD Authorized by: Chesley Noon, MD   Critical care provider statement:    Critical care time (minutes):  45   Critical care time was exclusive of:  Separately billable procedures and treating other patients and teaching time   Critical care was necessary to treat or prevent imminent or life-threatening deterioration of the following conditions:  Cardiac failure   Critical care was time spent personally by me on the following activities:  Discussions with consultants, evaluation of patient's response to treatment, examination of patient, ordering and performing treatments and interventions, ordering and review of laboratory studies, ordering and review of radiographic studies, pulse oximetry, re-evaluation of patient's condition, obtaining history from patient or surrogate and review of old charts   I assumed direction of critical care for this patient from another provider in my specialty: no       ____________________________________________   INITIAL IMPRESSION / ASSESSMENT AND PLAN / ED COURSE       87 year old female with past medical history of hypertension, hyperlipidemia, CAD, atrial fibrillation, and COPD on 2 L who presents to the ED complaining of chest pain starting 30 minutes prior to arrival.  Initial EKG concerning for STEMI with  lateral ST elevations.  Code STEMI called prior to arrival, patient with pain improving upon arrival to the ED.  Dr. Kirke Corin of cardiology is at bedside and will take patient to the Cath Lab after further discussion with daughter.  Patient received aspirin prior to arrival and we will bolus with heparin.      ____________________________________________   FINAL CLINICAL IMPRESSION(S) / ED DIAGNOSES  Final diagnoses:  ST elevation myocardial infarction (STEMI), unspecified artery [facility])     ED Discharge Orders         Ordered    AMB Referral to Cardiac Rehabilitation - Phase II        07/02/20 1533           Note:  This document was prepared using Dragon voice recognition software and may include unintentional dictation errors.   Chesley Noon, MD 07/02/20 1539

## 2020-07-02 NOTE — H&P (Addendum)
History and Physical   Peggy [name]:[id number] DOB: 1928-06-23 DOA: 07/02/2020  PCP: Lauro Regulus, MD  Outpatient Specialists: Dr. Darrold Junker, [facility] Cardiology Patient coming from: home via EMS  I have personally briefly reviewed patient's old medical records in [facility] EMR.  Chief Concern: chest pain  HPI: Peggy [name] is a 87 y.o. female with medical history significant for afib not on anticoagulation (previously on eliquis and was stopped due to GI bleeding problem), IDDM, anxiety, hypertension, presented to the ED for chief concern of chest pain.   She states the chest pain started on 07/02/20, 8/10, throbbing, persistent. She ports she has never had this pain before.  She endorses associated shortness of breath.  She denies trauma and neck discomfort.  She denies abnormal taste in her mouth.  She endorses nausea and denies vomiting.  Social history: She lives at home with her daughter Peggy [name].  Patient is a former smoker and quit in 2001.  Formally she smoked 1 pack/day.  Denies recreational and EtOH use.  At bedside, patient was in the ICU recovering from cath.  She was awake alert and oriented to her self, age, location of hospital.  She notes that her daughter Peggy [name] is at bedside.  She reports that her chest pain has resolved and shortness of breath has resolved.  She endorsed nausea while eating.  ROS: Constitutional: no weight change, no fever ENT/Mouth: no sore throat, no rhinorrhea Eyes: no eye pain, no vision changes Cardiovascular: no chest pain, no dyspnea,  no edema, no palpitations Respiratory: no cough, no sputum, no wheezing Gastrointestinal: + nausea, no vomiting, no diarrhea, no constipation Genitourinary: no urinary incontinence, no dysuria, no hematuria Musculoskeletal: no arthralgias, no myalgias Skin: no skin lesions, no pruritus, Neuro: + weakness, no loss of consciousness, no syncope Psych: no anxiety, no depression, + decrease  appetite Heme/Lymph: no bruising, no bleeding  Hospital Course: Patient presented via EMS for code STEMI. Patient was taken emergently to the cath lab and DES PCI was placed to the OM1.   Assessment/Plan  Principal Problem:   ST elevation myocardial infarction (STEMI) of lateral wall (HCC) Active Problems:   Adult hypothyroidism   CAD (coronary artery disease)   AF (paroxysmal atrial fibrillation) (HCC)   Anxiety   COPD (chronic obstructive pulmonary disease) (HCC)   Lateral wall STEMI sp PCI DES to OM1 Ischemic coronary artery disease -DAPT with aspirin and Plavix -[facility] cardiology has been consulted by interventional cardiologist, Dr. Kirke Corin -Isosorbide mononitrate 30 mg daily -Status post Cath Lab, interventional cardiologist performed DES PCI to OM2 and notified primary cardiologist group -Echo ordered per interventional cardiologist  Atrial fibrillation-resumed home amiodarone 200 mg daily, diltiazem 60 mg 3 times daily as needed  Hypertension-resumed metoprolol succinate 100 mg daily, isosorbide mononitrate 30 mg daily  Hypothyroid-levothyroxine 50 mcg daily before breakfast  Hyperlipidemia-atorvastatin 40 mg daily  Anxiety-Xanax 0.25 mg p.o. nightly  Hydroxyzine for itching  Chart reviewed.   DVT prophylaxis: Enoxaparin Code Status: DNR Diet: Heart healthy Family Communication: Peggy [name] at bedside has been updated Disposition Plan: Pending clinical course Consults called: cardiology   Admission status: Inpatient with telemetry  Past Medical History:  Diagnosis Date  . A-fib (HCC)   . Allergy   . Arthritis   . COPD (chronic obstructive pulmonary disease) (HCC)   . Diabetes mellitus without complication (HCC)   . GERD (gastroesophageal reflux disease)   . Hyperlipidemia   . Hypertension   . Oxygen dependent  2 liters  . Stroke [facility])    5 years ago per daughter  . TIA (transient ischemic attack)    Past Surgical History:  Procedure Laterality  Date  . ABDOMINAL HYSTERECTOMY  1970  . BACK SURGERY  2013  . colectomy Right 10/08/2013   Dr. Egbert Garibaldi  . CORONARY ANGIOPLASTY WITH STENT PLACEMENT     Social History:  reports that she quit smoking about 20 years ago. She has a 30.00 pack-year smoking history. She has never used smokeless tobacco. She reports that she does not drink alcohol and does not use drugs.  Allergies  Allergen Reactions  . Diphenhydramine Rash    AGITATION/DELIRIUM  . Metformin Diarrhea  . Oxybutynin Other (See Comments)    dizzy  . Amlodipine Rash  . Ciprofloxacin Rash  . Lisinopril Rash  . Penicillins Rash    Family states was a "long time ago"  . Saxagliptin Rash  . Sulfamethoxazole-Trimethoprim Rash   Family History  Problem Relation Age of Onset  . Breast cancer Mother   . Pancreatitis Father   . Breast cancer Sister   . Lung cancer Brother   . Melanoma Brother   . Throat cancer Brother    Family history: Family history reviewed and not pertinent  Prior to Admission medications   Medication Sig Start Date End Date Taking? Authorizing Provider  acetaminophen (TYLENOL) 500 MG tablet Take 500-1,000 mg by mouth every 6 (six) hours as needed for mild pain or moderate pain.    [provider]  ALPRAZolam (XANAX) 0.25 MG tablet TAKE ONE TAB PO QHS FOR SLEEP Patient taking differently: Take 0.25 mg by mouth at bedtime.  04/12/20   Lyndon Code, MD  amiodarone (PACERONE) 200 MG tablet Take 1 tablet (200 mg total) by mouth daily. 04/30/20 05/30/20  Briant Cedar, MD  aspirin EC 81 MG EC tablet Take 1 tablet (81 mg total) by mouth daily. 03/09/19   Mayo, Allyn Kenner, MD  Cholecalciferol (VITAMIN D3) 5000 units TABS Take 5,000 Units by mouth daily.    [provider]  diltiazem (CARDIZEM) 60 MG tablet Take 1 tablet (60 mg total) by mouth 3 (three) times daily as needed (Take if your HR is greater than 120 with palpitations). 04/17/20 05/17/20  Briant Cedar, MD  esomeprazole  (NEXIUM) 40 MG capsule Take 1 capsule (40 mg total) by mouth daily. 01/05/20   [name], NP  ferrous sulfate 325 (65 FE) MG tablet Take 325 mg by mouth daily with breakfast.    [provider]  gabapentin (NEURONTIN) 100 MG capsule Take 100 mg by mouth at bedtime.  11/23/19   [provider]  HUMULIN 70/30 KWIKPEN (70-30) 100 UNIT/ML KwikPen Inject 35 Units into the skin 2 (two) times daily. 04/01/20   Lurene Shadow, MD  hydrOXYzine (ATARAX/VISTARIL) 25 MG tablet Take 25 mg by mouth 4 (four) times daily as needed for itching. 06/05/17   [provider]  isosorbide mononitrate (IMDUR) 30 MG 24 hr tablet Take 30 mg by mouth daily. 11/09/19   [provider]  levothyroxine (SYNTHROID) 50 MCG tablet Take 50 mcg by mouth daily before breakfast.    [provider]  metoprolol succinate (TOPROL-XL) 100 MG 24 hr tablet Take 1 tablet (100 mg total) by mouth daily. Take with or immediately following a meal. 11/25/18   Jimmye Norman, NP  nitroGLYCERIN (NITROSTAT) 0.4 MG SL tablet Place 0.4 mg under the tongue every 5 (five) minutes as needed for  chest pain.  09/22/19 09/21/20  [provider]  SYMBICORT 80-4.5 MCG/ACT inhaler INHALE 2 PUFFS INTO THE LUNGS TWICE DAILY 04/17/20   Lyndon Code, MD  traZODone (DESYREL) 50 MG tablet Take 50 mg by mouth at bedtime as needed for sleep.  03/07/20   [provider]  trospium (SANCTURA) 20 MG tablet Take 1 tablet (20 mg total) by mouth daily. 04/01/20   Lurene Shadow, MD  VENTOLIN HFA 108 (90 Base) MCG/ACT inhaler Inhale 2 puffs into the lungs every 6 (six) hours as needed for wheezing or shortness of breath.     [provider]  vitamin B-12 (CYANOCOBALAMIN) 1000 MCG tablet Take 1,000 mcg by mouth daily.    [provider]   Physical Exam: Vitals:   07/02/20 1900 07/02/20 2000 07/02/20 2100 07/02/20 2200  BP: (!) 194/89 (!) 192/68 (!) 182/68 (!) 152/51  Pulse: 64 61 (!) 57 (!)  55  Resp: (!) 29 16 18 18   Temp:      SpO2: 100% 100% 100% 100%  Weight:      Height:       Constitutional: appears age-appropriate, NAD, calm, comfortable Eyes: PERRL, lids and conjunctivae normal ENMT: Mucous membranes are moist. Posterior pharynx clear of any exudate or lesions. Age-appropriate dentition.  Moderate hearing loss Neck: normal, supple, no masses, no thyromegaly Respiratory: clear to auscultation bilaterally, no wheezing, no crackles. Normal respiratory effort. No accessory muscle use.  Cardiovascular: Regular rate and rhythm, no murmurs / rubs / gallops. No extremity edema. 2+ pedal pulses. No carotid bruits.  Abdomen: Obese abdomen, no tenderness, no masses palpated, no hepatosplenomegaly. Bowel sounds positive.  Musculoskeletal: no clubbing / cyanosis. No joint deformity upper and lower extremities. Good ROM, no contractures, no atrophy. Normal muscle tone.  Skin: no rashes, lesions, ulcers. No induration Neurologic: Sensation intact. Strength 4/5 in all 4.  Psychiatric: Normal judgment and insight. Alert and oriented x 3. Normal mood.   EKG: independently reviewed, showing V5 to V6, rate of 67, QTc 480  Chest x-ray on Admission: was not ordered on admission  CARDIAC CATHETERIZATION  Addendum Date: 07/02/2020    Ramus lesion is 100% stenosed.  Mid Cx to Dist Cx lesion is 60% stenosed.  Mid LAD lesion is 90% stenosed.  Prox RCA lesion is 30% stenosed.  Mid RCA lesion is 20% stenosed.  Dist RCA lesion is 50% stenosed.  A drug-eluting stent was successfully placed using a STENT RESOLUTE ONYX 2.25X26.  2nd Mrg-1 lesion is 100% stenosed.  Post intervention, there is a 0% residual stenosis.  2nd Mrg-2 lesion is 100% stenosed.  1.  Acute lateral ST elevation myocardial infarction due to an occluded OM 2.  There is also significant stenosis in the mid LAD which is heavily calcified.  The stent in the RCA are patent with mild in-stent restenosis. 2.  Left ventricular  angiography was not performed.  We will obtain an echocardiogram. 3.  Severely elevated blood pressure and left ventricular end-diastolic pressure at 32 mmHg. 4.  Successful angioplasty and drug-eluting stent placement to OM 2.  The superior branch of this vessel seem to be occluded distally.  Distal distribution is overall small. Recommendations: Dual antiplatelet therapy for 12 months if tolerated. Aggressive treatment of risk factors. Patient had significant dyspnea and orthopnea by the end of the case and given significantly elevated LVEDP, she was given 1 dose of furosemide 20 mg IV.  Check an echocardiogram. The patient has severe disease in the mid LAD but  I doubt that she will be a candidate for staged PCI and atherectomy given her frail status and comorbidities.   Result Date: 07/02/2020  Ramus lesion is 100% stenosed.  Mid Cx to Dist Cx lesion is 60% stenosed.  Mid LAD lesion is 90% stenosed.  Prox RCA lesion is 30% stenosed.  Mid RCA lesion is 20% stenosed.  Dist RCA lesion is 50% stenosed.  A drug-eluting stent was successfully placed using a STENT RESOLUTE ONYX 2.25X26.  2nd Mrg-1 lesion is 100% stenosed.  Post intervention, there is a 0% residual stenosis.  2nd Mrg-2 lesion is 100% stenosed.  1.  Acute lateral ST elevation myocardial infarction due to an occluded OM 2.  There is also significant stenosis in the mid LAD which is heavily calcified.  The stent in the RCA are patent with mild in-stent restenosis. 2.  Left ventricular angiography was not performed.  We will obtain an echocardiogram. 3.  Severely elevated blood pressure and left ventricular end-diastolic pressure at 32 mmHg. 4.  Successful angioplasty and drug-eluting stent placement to OM 2.  The superior branch of this vessel seem to be occluded distally.  Distal distribution is overall small. Recommendations: Dual antiplatelet therapy for 12 months if tolerated. Aggressive treatment of risk factors. The patient has severe  disease in the mid LAD but I doubt that she will be a candidate for staged PCI and atherectomy given her frail status and comorbidities.  Labs on Admission: I have personally reviewed following labs  CBC: Recent Labs  Lab 07/02/20 1416 07/02/20 1615  WBC 10.5 13.0*  NEUTROABS 7.0  --   HGB 11.7* 12.2  HCT 39.5 41.6  MCV 79.8* 79.7*  PLT 352 341   Basic Metabolic Panel: Recent Labs  Lab 07/02/20 1416 07/02/20 1615  NA 139  --   K 4.0  --   CL 102  --   CO2 26  --   GLUCOSE 101*  --   BUN 14  --   CREATININE 0.95 1.05*  CALCIUM 9.4  --    Urine analysis:    Component Value Date/Time   COLORURINE STRAW (A) 03/30/2020 2030   APPEARANCEUR CLEAR (A) 03/30/2020 2030   APPEARANCEUR Hazy 08/11/2012 1201   LABSPEC 1.011 03/30/2020 2030   LABSPEC 1.020 08/11/2012 1201   PHURINE 6.0 03/30/2020 2030   GLUCOSEU >=500 (A) 03/30/2020 2030   GLUCOSEU Negative 08/11/2012 1201   HGBUR NEGATIVE 03/30/2020 2030   BILIRUBINUR NEGATIVE 03/30/2020 2030   BILIRUBINUR neg 02/11/2015 0950   BILIRUBINUR Negative 08/11/2012 1201   KETONESUR NEGATIVE 03/30/2020 2030   PROTEINUR NEGATIVE 03/30/2020 2030   UROBILINOGEN 0.2 02/11/2015 0950   NITRITE NEGATIVE 03/30/2020 2030   LEUKOCYTESUR NEGATIVE 03/30/2020 2030   LEUKOCYTESUR 1+ 08/11/2012 1201   Yuniel Blaney N Tanetta Fuhriman D.O. [facility]  If 7PM-7AM, please contact overnight-coverage provider If 7AM-7PM, please contact day coverage provider [email or web address]  07/02/2020, 11:07 PM

## 2020-07-02 NOTE — Progress Notes (Signed)
87 year old female with history of coronary artery disease status post remote RCA stent placement, paroxysmal atrial fibrillation not on anticoagulation due to recurrent GI bleed and multiple other comorbidities.  She presented with acute onset of chest pain.  EKG showed lateral ST elevation.  I discussed with the patient and daughter indication to proceed with emergent cardiac catheterization possible PCI.  The alternative is to treat medically considering her age and comorbidities.  Given continued chest pain, the patient agreed to proceed with left heart catheterization which was performed.  See Cath Lab recommendations.  She is a patient of Dr. Darrold Junker.  I informed him of patient's presence and also signed out to Dr. Gwen Pounds who was on-call.  [facility] cardiology to follow the patient while hospitalized.

## 2020-07-02 NOTE — Progress Notes (Signed)
   07/02/20 1345  Clinical Encounter Type  Visited With Patient and family together  Visit Type Code;Initial  Referral From Nurse  Consult/Referral To Chaplain   Chaplain responded to a code Stemi. Chaplain assessed the situation by engaging with doctor and nurse. Chaplain checked on the pt, nurses, and the doctor. Chaplain tried to be a calming presence for the pt and interacted with the pt.'s daughter. Daughter was able to express her emotions to chaplain.

## 2020-07-02 NOTE — Progress Notes (Signed)
*  PRELIMINARY RESULTS* Echocardiogram 2D Echocardiogram has been performed.  Peggy [name] 07/02/2020, 7:46 PM

## 2020-07-02 NOTE — ED Triage Notes (Signed)
Patient arrives via EMS with Code STEMI. Patient complaining of Chest Pain that started around 1330. Patient AOx4.

## 2020-07-02 NOTE — ED Notes (Signed)
Patient to cath lab on zoll with cardiologist, this RN, and tech.

## 2020-07-02 NOTE — ED Notes (Signed)
Cardiologist at bedside.  

## 2020-07-03 ENCOUNTER — Encounter: Payer: Self-pay | Admitting: Cardiovascular Disease

## 2020-07-03 ENCOUNTER — Other Ambulatory Visit: Payer: Self-pay

## 2020-07-03 LAB — ECHOCARDIOGRAM COMPLETE
AR max vel: 1 cm2
AV Peak grad: 8.5 mmHg
Ao pk vel: 1.46 m/s
Area-P 1/2: 4.36 cm2
Height: 62 in
S' Lateral: 3.67 cm
Weight: 2680.79 oz

## 2020-07-03 LAB — CBC
HCT: 37.6 % (ref 36.0–46.0)
Hemoglobin: 11.4 g/dL — ABNORMAL LOW (ref 12.0–15.0)
MCH: 23.6 pg — ABNORMAL LOW (ref 26.0–34.0)
MCHC: 30.3 g/dL (ref 30.0–36.0)
MCV: 77.7 fL — ABNORMAL LOW (ref 80.0–100.0)
Platelets: 338 10*3/uL (ref 150–400)
RBC: 4.84 MIL/uL (ref 3.87–5.11)
RDW: 14.9 % (ref 11.5–15.5)
WBC: 14.3 10*3/uL — ABNORMAL HIGH (ref 4.0–10.5)
nRBC: 0 % (ref 0.0–0.2)

## 2020-07-03 LAB — BASIC METABOLIC PANEL
Anion gap: 13 (ref 5–15)
BUN: 15 mg/dL (ref 8–23)
CO2: 25 mmol/L (ref 22–32)
Calcium: 8.7 mg/dL — ABNORMAL LOW (ref 8.9–10.3)
Chloride: 97 mmol/L — ABNORMAL LOW (ref 98–111)
Creatinine, Ser: 1.02 mg/dL — ABNORMAL HIGH (ref 0.44–1.00)
GFR, Estimated: 53 mL/min — ABNORMAL LOW (ref >60)
Glucose, Bld: 166 mg/dL — ABNORMAL HIGH (ref 70–99)
Potassium: 4.5 mmol/L (ref 3.5–5.1)
Sodium: 135 mmol/L (ref 135–145)

## 2020-07-03 LAB — GLUCOSE, CAPILLARY
Glucose-Capillary: 264 mg/dL — ABNORMAL HIGH (ref 70–99)
Glucose-Capillary: 192 mg/dL — ABNORMAL HIGH (ref 70–99)
Glucose-Capillary: 183 mg/dL — ABNORMAL HIGH (ref 70–99)

## 2020-07-03 MED ORDER — IPRATROPIUM-ALBUTEROL 0.5-2.5 (3) MG/3ML IN SOLN
3.0000 mL | Freq: Four times a day (QID) | RESPIRATORY_TRACT | Status: DC | PRN
  Administered 2020-07-03: 3 mL via RESPIRATORY_TRACT
  Filled 2020-07-03: qty 3

## 2020-07-03 MED ORDER — PROMETHAZINE HCL 25 MG/ML IJ SOLN
12.5000 mg | Freq: Four times a day (QID) | INTRAMUSCULAR | Status: DC | PRN
  Administered 2020-07-03: 12.5 mg via INTRAVENOUS
  Filled 2020-07-03: qty 1

## 2020-07-03 MED ORDER — ATORVASTATIN CALCIUM 80 MG PO TABS
80.0000 mg | ORAL_TABLET | Freq: Every day | ORAL | Status: DC
  Administered 2020-07-04: 80 mg via ORAL
  Filled 2020-07-03: qty 1

## 2020-07-03 MED ORDER — IRBESARTAN 150 MG PO TABS
150.0000 mg | ORAL_TABLET | Freq: Every day | ORAL | Status: DC
  Administered 2020-07-03 – 2020-07-04 (×2): 150 mg via ORAL
  Filled 2020-07-03 (×2): qty 1

## 2020-07-03 MED ORDER — DICLOFENAC SODIUM 1 % EX GEL
2.0000 g | Freq: Four times a day (QID) | CUTANEOUS | Status: DC | PRN
  Administered 2020-07-03: 2 g via TOPICAL
  Filled 2020-07-03: qty 100

## 2020-07-03 NOTE — Progress Notes (Signed)
PROGRESS NOTE    Peggy [name]  WLS:[id number] DOB: 30-May-1929 DOA: 07/02/2020 PCP: Lauro Regulus, MD   Brief Narrative: Taken from H&P Peggy [name] is a 87 y.o. female with medical history significant for afib not on anticoagulation (previously on eliquis and was stopped due to GI bleeding problem), IDDM, anxiety, hypertension, presented to the ED for chief concern of chest pain.  Admitted with STEMI, taken for cardiac catheterization, had multivessel disease s/p PCI of OM2. Echocardiogram with EF of 35 to 40%, lateral wall akinesia and mild to moderate mitral regurgitation.  Subjective: Patient was having some chest tightness and nausea today during morning rounds.No chest pain or SOB.  Assessment & Plan:   Principal Problem:   ST elevation myocardial infarction (STEMI) of lateral wall (HCC) Active Problems:   Adult hypothyroidism   CAD (coronary artery disease)   AF (paroxysmal atrial fibrillation) (HCC)   Anxiety   COPD (chronic obstructive pulmonary disease) (HCC)  Lateral wall STEMI sp PCI DES to OM2.  HFrEF. EF 35-40 %, secondary to recent MI. Cardiology is following-appreciate their recommendation. -Continue DAPT for 1 year. -Continue with high intensity statin -Continue with Imdur 30 mg daily -Cardiac rehab  Atrial fibrillation.  Currently rate well controlled. -Continue home dose of amiodarone. -Metoprolol 100 mg was added after cardiac catheterization. -Might have to stop amiodarone if becoming more bradycardic-I will defer that decision to her cardiologist. -No anticoagulation as patient is on DAPT.  Hypertension.  Blood pressure mildly elevated.  Currently on multiple medications which include metoprolol isosorbide mononitrate and irbesartan was added by cardiology today. -Continue to monitor.  Hyperlipidemia. -Increase the dose of atorvastatin to 80 mg daily  Anxiety. -Continue home dose of Xanax as needed  Objective: Vitals:   07/03/20 0300  07/03/20 0400 07/03/20 0500 07/03/20 0700  BP: (!) 172/60 (!) 171/64 (!) 164/64 (!) 174/61  Pulse: (!) 55 (!) 54 (!) 55 (!) 57  Resp: 20 (!) 21 (!) 22 (!) 21  Temp:  98.2 F (36.8 C)  97.6 F (36.4 C)  TempSrc:  Oral  Axillary  SpO2: 100% 100% 100% 100%  Weight:      Height:        Intake/Output Summary (Last 24 hours) at 07/03/2020 1256 Last data filed at 07/02/2020 1700 Gross per 24 hour  Intake -  Output 800 ml  Net -800 ml   Filed Weights   07/02/20 1413  Weight: 76 kg    Examination:  General exam: Appears calm and comfortable  Respiratory system: Clear to auscultation. Respiratory effort normal. Cardiovascular system: S1 & S2 heard, RRR. Gastrointestinal system: Soft, nontender, nondistended, bowel sounds positive. Central nervous system: Alert and oriented. No focal neurological deficits. Extremities: No edema, no cyanosis, pulses intact and symmetrical. Psychiatry: Judgement and insight appear normal. Mood & affect appropriate.    DVT prophylaxis: Lovenox Code Status: DNR Family Communication: Daughter was updated at bedside Disposition Plan:  Status is: Inpatient  Remains inpatient appropriate because:Inpatient level of care appropriate due to severity of illness   Dispo: The patient is from: Home              Anticipated d/c is to: Home              Anticipated d/c date is: 1 day              Patient currently is not medically stable to d/c.   Difficult to place patient No  Level of care: Progressive Cardiac  Consultants:   Cardiology  Procedures:  Antimicrobials:   Data Reviewed: I have personally reviewed following labs and imaging studies  CBC: Recent Labs  Lab 07/02/20 1416 07/02/20 1615 07/03/20 0523  WBC 10.5 13.0* 14.3*  NEUTROABS 7.0  --   --   HGB 11.7* 12.2 11.4*  HCT 39.5 41.6 37.6  MCV 79.8* 79.7* 77.7*  PLT 352 341 338   Basic Metabolic Panel: Recent Labs  Lab 07/02/20 1416 07/02/20 1615 07/03/20 0523  NA  139  --  135  K 4.0  --  4.5  CL 102  --  97*  CO2 26  --  25  GLUCOSE 101*  --  166*  BUN 14  --  15  CREATININE 0.95 1.05* 1.02*  CALCIUM 9.4  --  8.7*   GFR: Estimated Creatinine Clearance: 35.7 mL/min (A) (by C-G formula based on SCr of 1.02 mg/dL (H)). Liver Function Tests: No results for input(s): AST, ALT, ALKPHOS, BILITOT, PROT, ALBUMIN in the last 168 hours. No results for input(s): LIPASE, AMYLASE in the last 168 hours. No results for input(s): AMMONIA in the last 168 hours. Coagulation Profile: No results for input(s): INR, PROTIME in the last 168 hours. Cardiac Enzymes: No results for input(s): CKTOTAL, CKMB, CKMBINDEX, TROPONINI in the last 168 hours. BNP (last 3 results) No results for input(s): PROBNP in the last 8760 hours. HbA1C: No results for input(s): HGBA1C in the last 72 hours. CBG: Recent Labs  Lab 07/02/20 1604 07/02/20 2142 07/03/20 0911 07/03/20 1127  GLUCAP 99 136* 264* 192*   Lipid Profile: No results for input(s): CHOL, HDL, LDLCALC, TRIG, CHOLHDL, LDLDIRECT in the last 72 hours. Thyroid Function Tests: No results for input(s): TSH, T4TOTAL, FREET4, T3FREE, THYROIDAB in the last 72 hours. Anemia Panel: No results for input(s): VITAMINB12, FOLATE, FERRITIN, TIBC, IRON, RETICCTPCT in the last 72 hours. Sepsis Labs: No results for input(s): PROCALCITON, LATICACIDVEN in the last 168 hours.  Recent Results (from the past 240 hour(s))  SARS Coronavirus 2 by RT PCR (hospital order, performed in [facility] hospital lab) Nasopharyngeal Nasopharyngeal Swab     Status: None   Collection Time: 07/02/20  2:18 PM   Specimen: Nasopharyngeal Swab  Result Value Ref Range Status   SARS Coronavirus 2 NEGATIVE NEGATIVE Final    Comment: (NOTE) SARS-CoV-2 target nucleic acids are NOT DETECTED.  The SARS-CoV-2 RNA is generally detectable in upper and lower respiratory specimens during the acute phase of infection. The lowest concentration of SARS-CoV-2  viral copies this assay can detect is 250 copies / mL. A negative result does not preclude SARS-CoV-2 infection and should not be used as the sole basis for treatment or other patient management decisions.  A negative result may occur with improper specimen collection / handling, submission of specimen other than nasopharyngeal swab, presence of viral mutation(s) within the areas targeted by this assay, and inadequate number of viral copies (<250 copies / mL). A negative result must be combined with clinical observations, patient history, and epidemiological information.  Fact Sheet for Patients:   BoilerBrush.com.cy  Fact Sheet for Healthcare Providers: https://pope.com/  This test is not yet approved or  cleared by the [place] FDA and has been authorized for detection and/or diagnosis of SARS-CoV-2 by FDA under an Emergency Use Authorization (EUA).  This EUA will remain in effect (meaning this test can be used) for the duration of the COVID-19 declaration under Section 564(b)(1) of the Act, 21 U.S.C.  section 360bbb-3(b)(1), unless the authorization is terminated or revoked sooner.  Performed at [facility] [address] [place], [place] [address]   MRSA PCR Screening     Status: None   Collection Time: 07/02/20  4:02 PM   Specimen: Nasopharyngeal  Result Value Ref Range Status   MRSA by PCR NEGATIVE NEGATIVE Final    Comment:        The GeneXpert MRSA Assay (FDA approved for NASAL specimens only), is one component of a comprehensive MRSA colonization surveillance program. It is not intended to diagnose MRSA infection nor to guide or monitor treatment for MRSA infections. Performed at [facility], [address] [place], [place] [address]      Radiology Studies: CARDIAC CATHETERIZATION  Addendum Date: 07/02/2020    Ramus lesion is 100% stenosed.  Mid Cx to Dist Cx lesion is 60% stenosed.   Mid LAD lesion is 90% stenosed.  Prox RCA lesion is 30% stenosed.  Mid RCA lesion is 20% stenosed.  Dist RCA lesion is 50% stenosed.  A drug-eluting stent was successfully placed using a STENT RESOLUTE ONYX 2.25X26.  2nd Mrg-1 lesion is 100% stenosed.  Post intervention, there is a 0% residual stenosis.  2nd Mrg-2 lesion is 100% stenosed.  1.  Acute lateral ST elevation myocardial infarction due to an occluded OM 2.  There is also significant stenosis in the mid LAD which is heavily calcified.  The stent in the RCA are patent with mild in-stent restenosis. 2.  Left ventricular angiography was not performed.  We will obtain an echocardiogram. 3.  Severely elevated blood pressure and left ventricular end-diastolic pressure at 32 mmHg. 4.  Successful angioplasty and drug-eluting stent placement to OM 2.  The superior branch of this vessel seem to be occluded distally.  Distal distribution is overall small. Recommendations: Dual antiplatelet therapy for 12 months if tolerated. Aggressive treatment of risk factors. Patient had significant dyspnea and orthopnea by the end of the case and given significantly elevated LVEDP, she was given 1 dose of furosemide 20 mg IV.  Check an echocardiogram. The patient has severe disease in the mid LAD but I doubt that she will be a candidate for staged PCI and atherectomy given her frail status and comorbidities.   Result Date: 07/02/2020  Ramus lesion is 100% stenosed.  Mid Cx to Dist Cx lesion is 60% stenosed.  Mid LAD lesion is 90% stenosed.  Prox RCA lesion is 30% stenosed.  Mid RCA lesion is 20% stenosed.  Dist RCA lesion is 50% stenosed.  A drug-eluting stent was successfully placed using a STENT RESOLUTE ONYX 2.25X26.  2nd Mrg-1 lesion is 100% stenosed.  Post intervention, there is a 0% residual stenosis.  2nd Mrg-2 lesion is 100% stenosed.  1.  Acute lateral ST elevation myocardial infarction due to an occluded OM 2.  There is also significant stenosis in  the mid LAD which is heavily calcified.  The stent in the RCA are patent with mild in-stent restenosis. 2.  Left ventricular angiography was not performed.  We will obtain an echocardiogram. 3.  Severely elevated blood pressure and left ventricular end-diastolic pressure at 32 mmHg. 4.  Successful angioplasty and drug-eluting stent placement to OM 2.  The superior branch of this vessel seem to be occluded distally.  Distal distribution is overall small. Recommendations: Dual antiplatelet therapy for 12 months if tolerated. Aggressive treatment of risk factors. The patient has severe disease in the mid LAD but I doubt that she  will be a candidate for staged PCI and atherectomy given her frail status and comorbidities.  ECHOCARDIOGRAM COMPLETE  Result Date: 07/03/2020    ECHOCARDIOGRAM REPORT   Patient Name:   Peggy [name] Date of Exam: 07/02/2020 Medical Rec #:  [id number]   Height:       62.0 in Accession #:    [id number]  Weight:       167.5 lb Date of Birth:  [birth year]   BSA:          1.773 m Patient Age:    89 years    BP:           192/90 mmHg Patient Gender: F           HR:           67 bpm. Exam Location:  [facility] Procedure: 2D Echo, Cardiac Doppler and Color Doppler Indications:     Acute myocardial infarction, unspecified I21.9  History:         Patient has prior history of Echocardiogram examinations. TIA;                  Risk Factors:Hypertension.  Sonographer:     Neysa Bonito Roar Referring Phys:  [id number] QHUTMLYY A ARIDA Diagnosing Phys: Arnoldo Hooker MD IMPRESSIONS  1. Left ventricular ejection fraction, by estimation, is 35 to 40%. The left ventricle has moderately decreased function. The left ventricle demonstrates regional wall motion abnormalities (see scoring diagram/findings for description). Left ventricular  diastolic parameters were normal.  2. Right ventricular systolic function is normal. The right ventricular size is normal.  3. Left atrial size was mildly dilated.  4. The mitral valve is normal in  structure. Moderate mitral valve regurgitation.  5. The aortic valve is normal in structure. Aortic valve regurgitation is not visualized. FINDINGS  Left Ventricle: Left ventricular ejection fraction, by estimation, is 35 to 40%. The left ventricle has moderately decreased function. The left ventricle demonstrates regional wall motion abnormalities. Severe akinesis of the left ventricular, entire lateral wall. Mild hypokinesis of the left ventricular, mid-apical inferolateral wall. The left ventricular internal cavity size was normal in size. There is no left ventricular hypertrophy. Left ventricular diastolic parameters were normal. Right Ventricle: The right ventricular size is normal. No increase in right ventricular wall thickness. Right ventricular systolic function is normal. Left Atrium: Left atrial size was mildly dilated. Right Atrium: Right atrial size was normal in size. Pericardium: There is no evidence of pericardial effusion. Mitral Valve: The mitral valve is normal in structure. Moderate mitral valve regurgitation. Tricuspid Valve: The tricuspid valve is normal in structure. Tricuspid valve regurgitation is mild. Aortic Valve: The aortic valve is normal in structure. Aortic valve regurgitation is not visualized. Aortic valve peak gradient measures 8.5 mmHg. Pulmonic Valve: The pulmonic valve was normal in structure. Pulmonic valve regurgitation is trivial. Aorta: The aortic root and ascending aorta are structurally normal, with no evidence of dilitation. IAS/Shunts: No atrial level shunt detected by color flow Doppler.  LEFT VENTRICLE PLAX 2D LVIDd:         4.79 cm  Diastology LVIDs:         3.67 cm  LV e' medial:  4.03 cm/s LV PW:         1.18 cm  LV e' lateral: 6.53 cm/s LV IVS:        1.28 cm LVOT diam:     1.70 cm LVOT Area:     2.27 cm  RIGHT VENTRICLE RV Mid diam:  3.11 cm RV S prime:     12.00 cm/s TAPSE (M-mode): 2.1 cm LEFT ATRIUM             Index       RIGHT ATRIUM           Index LA  diam:        3.80 cm 2.14 cm/m  RA Area:     11.40 cm LA Vol (A2C):   76.0 ml 42.86 ml/m RA Volume:   20.80 ml  11.73 ml/m LA Vol (A4C):   67.4 ml 38.01 ml/m LA Biplane Vol: 73.9 ml 41.68 ml/m  AORTIC VALVE                PULMONIC VALVE AV Area (Vmax): 1.00 cm    PV Vmax:        0.66 m/s AV Vmax:        146.00 cm/s PV Peak grad:   1.8 mmHg AV Peak Grad:   8.5 mmHg    RVOT Peak grad: 1 mmHg LVOT Vmax:      64.50 cm/s  MITRAL VALVE               TRICUSPID VALVE MV Area (PHT): 4.36 cm    TR Peak grad:   38.9 mmHg MV Decel Time: 174 msec    TR Vmax:        312.00 cm/s MV A velocity: 96.30 cm/s MV A Prime:    8.7 cm/s    SHUNTS                            Systemic Diam: 1.70 cm Arnoldo HookerBruce Kowalski MD Electronically signed by Arnoldo HookerBruce Kowalski MD Signature Date/Time: 07/03/2020/8:26:24 AM    Final     Scheduled Meds: . ALPRAZolam  0.25 mg Oral QHS  . amiodarone  200 mg Oral Daily  . aspirin EC  81 mg Oral Daily  . atorvastatin  40 mg Oral Daily  . Chlorhexidine Gluconate Cloth  6 each Topical Daily  . cholecalciferol  5,000 Units Oral Daily  . clopidogrel  75 mg Oral Q breakfast  . enoxaparin (LOVENOX) injection  40 mg Subcutaneous Q24H  . ferrous sulfate  325 mg Oral Q breakfast  . gabapentin  100 mg Oral QHS  . insulin aspart protamine- aspart  35 Units Subcutaneous BID WC  . irbesartan  150 mg Oral Daily  . isosorbide mononitrate  30 mg Oral Daily  . levothyroxine  50 mcg Oral QAC breakfast  . mouth rinse  15 mL Mouth Rinse BID  . melatonin  5 mg Oral QHS  . metoprolol succinate  100 mg Oral Daily  . mometasone-formoterol  2 puff Inhalation BID  . pantoprazole  40 mg Oral Daily  . sodium chloride flush  3 mL Intravenous Q12H  . vitamin B-12  1,000 mcg Oral Daily   Continuous Infusions: . sodium chloride       LOS: 1 day   Time spent: 35 minutes.  Arnetha CourserSumayya Libi Corso, MD [facility]  If 7PM-7AM, please contact night-coverage [email or web address]  07/03/2020, 12:56 PM   This record has been  created using Conservation officer, historic buildingsDragon voice recognition software. Errors have been sought and corrected,but may not always be located. Such creation errors do not reflect on the standard of care.

## 2020-07-03 NOTE — Progress Notes (Signed)
[facility] Encounter Note  Patient: Peggy [name] / Admit Date: 07/02/2020 / Date of Encounter: 07/03/2020, 8:59 AM   Subjective: Patient is feeling much better today with no further evidence of chest discomfort or shortness of breath.  Patient is ambulating well on appropriate medication management currently the patient has had a significant elevation of troponin consistent with ST elevation myocardial infarction.  Patient currently tolerating her medications well with telemetry showing normal sinus rhythm  Echocardiogram showing mild LV systolic dysfunction with lateral akinesis and ejection fraction of 40% with mild to moderate mitral regurgitation  Review of Systems: Positive for: Shortness of breath Negative for: Vision change, hearing change, syncope, dizziness, nausea, vomiting,diarrhea, bloody stool, stomach pain, cough, congestion, diaphoresis, urinary frequency, urinary pain,skin lesions, skin rashes Others previously listed  Objective: Telemetry: Normal sinus rhythm Physical Exam: Blood pressure (!) 174/61, pulse (!) 57, temperature 97.6 F (36.4 C), temperature source Axillary, resp. rate (!) 21, height 5\' 2"  (1.575 m), weight 76 kg, SpO2 100 %. Body mass index is 30.65 kg/m. General: Well developed, well nourished, in no acute distress. Head: Normocephalic, atraumatic, sclera non-icteric, no xanthomas, nares are without discharge. Neck: No apparent masses Lungs: Normal respirations with no wheezes, no rhonchi, no rales , no crackles   Heart: Regular rate and rhythm, normal S1 S2, apical murmur, no rub, no gallop, PMI is normal size and placement, carotid upstroke normal without bruit, jugular venous pressure normal Abdomen: Soft, non-tender, non-distended with normoactive bowel sounds. No hepatosplenomegaly. Abdominal aorta is normal size without bruit Extremities: No edema, no clubbing, no cyanosis, no ulcers,  Peripheral: 2+ radial, 2+ femoral, 2+ dorsal  pedal pulses Neuro: Alert and oriented. Moves all extremities spontaneously. Psych:  Responds to questions appropriately with a normal affect.   Intake/Output Summary (Last 24 hours) at 07/03/2020 0859 Last data filed at 07/02/2020 1700 Gross per 24 hour  Intake -  Output 800 ml  Net -800 ml    Inpatient Medications:  . ALPRAZolam  0.25 mg Oral QHS  . amiodarone  200 mg Oral Daily  . aspirin EC  81 mg Oral Daily  . atorvastatin  40 mg Oral Daily  . Chlorhexidine Gluconate Cloth  6 each Topical Daily  . cholecalciferol  5,000 Units Oral Daily  . clopidogrel  75 mg Oral Q breakfast  . enoxaparin (LOVENOX) injection  40 mg Subcutaneous Q24H  . EPINEPHrine      . ferrous sulfate  325 mg Oral Q breakfast  . gabapentin  100 mg Oral QHS  . insulin aspart protamine- aspart  35 Units Subcutaneous BID WC  . isosorbide mononitrate  30 mg Oral Daily  . levothyroxine  50 mcg Oral QAC breakfast  . mouth rinse  15 mL Mouth Rinse BID  . melatonin  5 mg Oral QHS  . metoprolol succinate  100 mg Oral Daily  . mometasone-formoterol  2 puff Inhalation BID  . pantoprazole  40 mg Oral Daily  . sodium chloride flush  3 mL Intravenous Q12H  . vitamin B-12  1,000 mcg Oral Daily   Infusions:  . sodium chloride      Labs: Recent Labs    07/02/20 1416 07/02/20 1615 07/03/20 0523  NA 139  --  135  K 4.0  --  4.5  CL 102  --  97*  CO2 26  --  25  GLUCOSE 101*  --  166*  BUN 14  --  15  CREATININE 0.95 1.05* 1.02*  CALCIUM  9.4  --  8.7*   No results for input(s): AST, ALT, ALKPHOS, BILITOT, PROT, ALBUMIN in the last 72 hours. Recent Labs    07/02/20 1416 07/02/20 1615 07/03/20 0523  WBC 10.5 13.0* 14.3*  NEUTROABS 7.0  --   --   HGB 11.7* 12.2 11.4*  HCT 39.5 41.6 37.6  MCV 79.8* 79.7* 77.7*  PLT 352 341 338   No results for input(s): CKTOTAL, CKMB, TROPONINI in the last 72 hours. Invalid input(s): POCBNP No results for input(s): HGBA1C in the last 72 hours.   Weights: Filed  Weights   07/02/20 1413  Weight: 76 kg     Radiology/Studies:  CARDIAC CATHETERIZATION  Addendum Date: 07/02/2020    Ramus lesion is 100% stenosed.  Mid Cx to Dist Cx lesion is 60% stenosed.  Mid LAD lesion is 90% stenosed.  Prox RCA lesion is 30% stenosed.  Mid RCA lesion is 20% stenosed.  Dist RCA lesion is 50% stenosed.  A drug-eluting stent was successfully placed using a STENT RESOLUTE ONYX 2.25X26.  2nd Mrg-1 lesion is 100% stenosed.  Post intervention, there is a 0% residual stenosis.  2nd Mrg-2 lesion is 100% stenosed.  1.  Acute lateral ST elevation myocardial infarction due to an occluded OM 2.  There is also significant stenosis in the mid LAD which is heavily calcified.  The stent in the RCA are patent with mild in-stent restenosis. 2.  Left ventricular angiography was not performed.  We will obtain an echocardiogram. 3.  Severely elevated blood pressure and left ventricular end-diastolic pressure at 32 mmHg. 4.  Successful angioplasty and drug-eluting stent placement to OM 2.  The superior branch of this vessel seem to be occluded distally.  Distal distribution is overall small. Recommendations: Dual antiplatelet therapy for 12 months if tolerated. Aggressive treatment of risk factors. Patient had significant dyspnea and orthopnea by the end of the case and given significantly elevated LVEDP, she was given 1 dose of furosemide 20 mg IV.  Check an echocardiogram. The patient has severe disease in the mid LAD but I doubt that she will be a candidate for staged PCI and atherectomy given her frail status and comorbidities.   Result Date: 07/02/2020  Ramus lesion is 100% stenosed.  Mid Cx to Dist Cx lesion is 60% stenosed.  Mid LAD lesion is 90% stenosed.  Prox RCA lesion is 30% stenosed.  Mid RCA lesion is 20% stenosed.  Dist RCA lesion is 50% stenosed.  A drug-eluting stent was successfully placed using a STENT RESOLUTE ONYX 2.25X26.  2nd Mrg-1 lesion is 100% stenosed.  Post  intervention, there is a 0% residual stenosis.  2nd Mrg-2 lesion is 100% stenosed.  1.  Acute lateral ST elevation myocardial infarction due to an occluded OM 2.  There is also significant stenosis in the mid LAD which is heavily calcified.  The stent in the RCA are patent with mild in-stent restenosis. 2.  Left ventricular angiography was not performed.  We will obtain an echocardiogram. 3.  Severely elevated blood pressure and left ventricular end-diastolic pressure at 32 mmHg. 4.  Successful angioplasty and drug-eluting stent placement to OM 2.  The superior branch of this vessel seem to be occluded distally.  Distal distribution is overall small. Recommendations: Dual antiplatelet therapy for 12 months if tolerated. Aggressive treatment of risk factors. The patient has severe disease in the mid LAD but I doubt that she will be a candidate for staged PCI and atherectomy given her frail status and  comorbidities.  ECHOCARDIOGRAM COMPLETE  Result Date: 07/03/2020    ECHOCARDIOGRAM REPORT   Patient Name:   CHANTAVIA KRAMARZ Date of Exam: 07/02/2020 Medical Rec #:  [id number]   Height:       62.0 in Accession #:    [id number]  Weight:       167.5 lb Date of Birth:  [birth year]   BSA:          1.773 m Patient Age:    87 years    BP:           192/90 mmHg Patient Gender: F           HR:           67 bpm. Exam Location:  [facility] Procedure: 2D Echo, Cardiac Doppler and Color Doppler Indications:     Acute myocardial infarction, unspecified I21.9  History:         Patient has prior history of Echocardiogram examinations. TIA;                  Risk Factors:Hypertension.  Sonographer:     Neysa Bonito Roar Referring Phys:  [id number] A ARIDA Diagnosing Phys: Arnoldo Hooker MD IMPRESSIONS  1. Left ventricular ejection fraction, by estimation, is 35 to 40%. The left ventricle has moderately decreased function. The left ventricle demonstrates regional wall motion abnormalities (see scoring diagram/findings for description). Left  ventricular  diastolic parameters were normal.  2. Right ventricular systolic function is normal. The right ventricular size is normal.  3. Left atrial size was mildly dilated.  4. The mitral valve is normal in structure. Moderate mitral valve regurgitation.  5. The aortic valve is normal in structure. Aortic valve regurgitation is not visualized. FINDINGS  Left Ventricle: Left ventricular ejection fraction, by estimation, is 35 to 40%. The left ventricle has moderately decreased function. The left ventricle demonstrates regional wall motion abnormalities. Severe akinesis of the left ventricular, entire lateral wall. Mild hypokinesis of the left ventricular, mid-apical inferolateral wall. The left ventricular internal cavity size was normal in size. There is no left ventricular hypertrophy. Left ventricular diastolic parameters were normal. Right Ventricle: The right ventricular size is normal. No increase in right ventricular wall thickness. Right ventricular systolic function is normal. Left Atrium: Left atrial size was mildly dilated. Right Atrium: Right atrial size was normal in size. Pericardium: There is no evidence of pericardial effusion. Mitral Valve: The mitral valve is normal in structure. Moderate mitral valve regurgitation. Tricuspid Valve: The tricuspid valve is normal in structure. Tricuspid valve regurgitation is mild. Aortic Valve: The aortic valve is normal in structure. Aortic valve regurgitation is not visualized. Aortic valve peak gradient measures 8.5 mmHg. Pulmonic Valve: The pulmonic valve was normal in structure. Pulmonic valve regurgitation is trivial. Aorta: The aortic root and ascending aorta are structurally normal, with no evidence of dilitation. IAS/Shunts: No atrial level shunt detected by color flow Doppler.  LEFT VENTRICLE PLAX 2D LVIDd:         4.79 cm  Diastology LVIDs:         3.67 cm  LV e' medial:  4.03 cm/s LV PW:         1.18 cm  LV e' lateral: 6.53 cm/s LV IVS:        1.28  cm LVOT diam:     1.70 cm LVOT Area:     2.27 cm  RIGHT VENTRICLE RV Mid diam:    3.11 cm RV S prime:     12.00  cm/s TAPSE (M-mode): 2.1 cm LEFT ATRIUM             Index       RIGHT ATRIUM           Index LA diam:        3.80 cm 2.14 cm/m  RA Area:     11.40 cm LA Vol (A2C):   76.0 ml 42.86 ml/m RA Volume:   20.80 ml  11.73 ml/m LA Vol (A4C):   67.4 ml 38.01 ml/m LA Biplane Vol: 73.9 ml 41.68 ml/m  AORTIC VALVE                PULMONIC VALVE AV Area (Vmax): 1.00 cm    PV Vmax:        0.66 m/s AV Vmax:        146.00 cm/s PV Peak grad:   1.8 mmHg AV Peak Grad:   8.5 mmHg    RVOT Peak grad: 1 mmHg LVOT Vmax:      64.50 cm/s  MITRAL VALVE               TRICUSPID VALVE MV Area (PHT): 4.36 cm    TR Peak grad:   38.9 mmHg MV Decel Time: 174 msec    TR Vmax:        312.00 cm/s MV A velocity: 96.30 cm/s MV A Prime:    8.7 cm/s    SHUNTS                            Systemic Diam: 1.70 cm Arnoldo Hooker MD Electronically signed by Arnoldo Hooker MD Signature Date/Time: 07/03/2020/8:26:24 AM    Final      Assessment and Recommendation  87 y.o. female with acute ST elevation myocardial infarction with lateral myocardial infarction hypertension hyperlipidemia paroxysmal nonvalvular atrial fibrillation without evidence of congestive heart failure 1.  Continue dual antiplatelet therapy for acute ST elevation myocardial infarction 2.  Continue high intensity cholesterol therapy 3.  No further cardiac diagnostics necessary at this time 4.  Continuation of amiodarone metoprolol for heart rate control and maintenance of normal sinus rhythm 5.  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 FACC

## 2020-07-04 LAB — GLUCOSE, CAPILLARY
Glucose-Capillary: 263 mg/dL — ABNORMAL HIGH (ref 70–99)
Glucose-Capillary: 311 mg/dL — ABNORMAL HIGH (ref 70–99)

## 2020-07-04 MED ORDER — IRBESARTAN 150 MG PO TABS: 150.0000 mg | ORAL_TABLET | Freq: Every day | ORAL | 0 refills | Status: DC

## 2020-07-04 MED ORDER — ATORVASTATIN CALCIUM 80 MG PO TABS: 80.0000 mg | ORAL_TABLET | Freq: Every day | ORAL | 1 refills | Status: DC

## 2020-07-04 MED ORDER — APIXABAN 5 MG PO TABS
5.0000 mg | ORAL_TABLET | Freq: Two times a day (BID) | ORAL | Status: DC
  Administered 2020-07-04: 5 mg via ORAL
  Filled 2020-07-04: qty 1

## 2020-07-04 MED ORDER — AMIODARONE HCL 200 MG PO TABS: 200.0000 mg | ORAL_TABLET | Freq: Two times a day (BID) | ORAL | 1 refills | Status: DC

## 2020-07-04 MED ORDER — APIXABAN 5 MG PO TABS: 5.0000 mg | ORAL_TABLET | Freq: Two times a day (BID) | ORAL | 1 refills | Status: DC

## 2020-07-04 MED ORDER — AMIODARONE HCL 200 MG PO TABS
200.0000 mg | ORAL_TABLET | Freq: Two times a day (BID) | ORAL | Status: DC
  Administered 2020-07-04: 200 mg via ORAL
  Filled 2020-07-04: qty 1

## 2020-07-04 MED ORDER — CLOPIDOGREL BISULFATE 75 MG PO TABS: 75.0000 mg | ORAL_TABLET | Freq: Every day | ORAL | 3 refills | Status: DC

## 2020-07-04 NOTE — Progress Notes (Signed)
Mobility Specialist - Progress Note   07/04/20 1200  Mobility  Activity Ambulated in hall  Level of Assistance Minimal assist, patient does 75% or more  Assistive Device None  Distance Ambulated (ft) 200 ft  Mobility Response Tolerated well  Mobility performed by Mobility specialist  $Mobility charge 1 Mobility    Pre-mobility: 53 HR, 97% SpO2 During mobility: 70 HR, 98% SpO2 Post-mobility: 59 HR, 97% SpO2   Pt was lying in bed upon arrival utilizing room air. Daughter present in room. Pt agreed to session. Pt denied pain, nausea, and fatigue. Pt states that she does not use RW at home, but does have one available. Pt reports using a SPC occasionally. Pt was able to get EOB with minA. Pt stood at bedside with supervision. Mild LOB upon standing but easily corrected with CGA. Pt ambulated 200' in hallway without AD. Pt occasionally utilized rails as support. Mild LOB during ambulation x1. Pt slightly unsteady, and may benefit from use of RW. Pt denied SOB, but did voice a little weakness in LE. O2 maintained high 90s during activity, with a-fib HR ranging between 47-89 bpm. Overall, pt tolerated session well. Pt was left in recliner with all needs in reach and alarm set. Nurse notified of performance.    Filiberto Pinks Mobility Specialist 07/04/20, 12:13 PM

## 2020-07-04 NOTE — Discharge Summary (Signed)
Physician Discharge Summary  [name]:[id number] DOB: 1928/09/02 DOA: 07/02/2020  PCP: Lauro Regulus, MD  Admit date: 07/02/2020 Discharge date: 07/04/2020  Admitted From: Home Disposition: Home  Recommendations for Outpatient Follow-up:  1. Follow up with PCP in 1-2 weeks 2. Follow-up with cardiology 3. Please obtain BMP/CBC in one week 4. Please follow up on the following pending results: None  Home Health: No Equipment/Devices: None Discharge Condition: Stable CODE STATUS: DNR Diet recommendation: Heart Healthy / Carb Modified   Brief/Interim Summary: Peggy [name] a 87 y.o.femalewith medical history significant forafib not on anticoagulation (previously on eliquis and was stopped due to GI bleeding problem), IDDM, anxiety, hypertension, presented to the ED for chief concern of chest pain. Admitted with STEMI involving the lateral leads, taken for cardiac catheterization, had multivessel disease s/p PCI of OM2.  Patient tolerated the procedure well.  Echocardiogram next day with EF of 35 to 40%, lateral wall akinesia and mild to moderate mitral regurgitation.  Cardiologist make some changes to her medications, they increase the dose of amiodarone, she will hold her Cardizem until she will see her cardiologist.  Continue with metoprolol and statin.  They also restarted Eliquis and because of her history of GI bleed she will not continue with DAPT and only do Plavix and Eliquis.  Further management by cardiology as an outpatient.  She will continue with rest of her home meds and follow-up with cardiology closely and participate in cardiac rehab.  Discharge Diagnoses:  Principal Problem:   ST elevation myocardial infarction (STEMI) of lateral wall (HCC) Active Problems:   Adult hypothyroidism   CAD (coronary artery disease)   AF (paroxysmal atrial fibrillation) (HCC)   Anxiety   COPD (chronic obstructive pulmonary disease) (HCC)   Discharge  Instructions  Discharge Instructions    (HEART FAILURE PATIENTS) Call MD:  Anytime you have any of the following symptoms: 1) 3 pound weight gain in 24 hours or 5 pounds in 1 week 2) shortness of breath, with or without a dry hacking cough 3) swelling in the hands, feet or stomach 4) if you have to sleep on extra pillows at night in order to breathe.   Complete by: As directed    AMB Referral to Cardiac Rehabilitation - Phase II   Complete by: As directed    Diagnosis:  STEMI Coronary Stents     After initial evaluation and assessments completed: Virtual Based Care may be provided alone or in conjunction with Phase 2 Cardiac Rehab based on patient barriers.: Yes   Diet - low sodium heart healthy   Complete by: As directed    Discharge instructions   Complete by: As directed    It was pleasure taking care of you. Your cardiologist make some changes to your medications, please take it as directed and follow-up with them closely for further management. We stopped your aspirin and start you on Eliquis for your atrial fibrillation.  Please mindful that Eliquis and Plavix both can increase the chance of your bleeding.  If you experience any abnormal breathing please seek medical attention. They increase the dose of amiodarone to help with your A. Fib.  Continue holding your Cardizem until you will see your cardiologist for further recommendations.   Increase activity slowly   Complete by: As directed      Allergies as of 07/04/2020      Reactions   Diphenhydramine Rash   AGITATION/DELIRIUM   Metformin Diarrhea   Oxybutynin Other (See Comments)  dizzy   Amlodipine Rash   Ciprofloxacin Rash   Lisinopril Rash   Penicillins Rash   Family states was a "long time ago"   Saxagliptin Rash   Sulfamethoxazole-trimethoprim Rash      Medication List    STOP taking these medications   aspirin 81 MG EC tablet   diltiazem 60 MG tablet Commonly known as: Cardizem     TAKE these medications    acetaminophen 500 MG tablet Commonly known as: TYLENOL Take 500-1,000 mg by mouth every 6 (six) hours as needed for mild pain or moderate pain.   ALPRAZolam 0.25 MG tablet Commonly known as: XANAX TAKE ONE TAB PO QHS FOR SLEEP What changed:   how much to take  how to take this  when to take this  additional instructions   amiodarone 200 MG tablet Commonly known as: PACERONE Take 1 tablet (200 mg total) by mouth 2 (two) times daily. What changed: when to take this   apixaban 5 MG Tabs tablet Commonly known as: ELIQUIS Take 1 tablet (5 mg total) by mouth 2 (two) times daily.   atorvastatin 80 MG tablet Commonly known as: LIPITOR Take 1 tablet (80 mg total) by mouth daily. Start taking on: July 05, 2020   clopidogrel 75 MG tablet Commonly known as: PLAVIX Take 1 tablet (75 mg total) by mouth daily with breakfast. Start taking on: July 05, 2020   diphenoxylate-atropine 2.5-0.025 MG tablet Commonly known as: LOMOTIL Take 1 tablet by mouth 2 (two) times daily as needed for diarrhea or loose stools.   esomeprazole 20 MG capsule Commonly known as: NEXIUM Take 20 mg by mouth 2 (two) times daily.   ferrous sulfate 325 (65 FE) MG tablet Take 325 mg by mouth daily with breakfast.   gabapentin 100 MG capsule Commonly known as: NEURONTIN Take 100 mg by mouth at bedtime.   HumuLIN 70/30 KwikPen (70-30) 100 UNIT/ML KwikPen Generic drug: insulin isophane & regular human Inject 35 Units into the skin 2 (two) times daily.   hydrOXYzine 25 MG tablet Commonly known as: ATARAX/VISTARIL Take 25 mg by mouth 4 (four) times daily as needed for itching.   ipratropium-albuterol 0.5-2.5 (3) MG/3ML Soln Commonly known as: DUONEB Take 3 mLs by nebulization every 6 (six) hours as needed (SOB).   irbesartan 150 MG tablet Commonly known as: AVAPRO Take 1 tablet (150 mg total) by mouth daily. Start taking on: July 05, 2020   isosorbide mononitrate 30 MG 24 hr  tablet Commonly known as: IMDUR Take 30 mg by mouth daily.   levothyroxine 50 MCG tablet Commonly known as: SYNTHROID Take 50 mcg by mouth daily before breakfast.   metoprolol succinate 100 MG 24 hr tablet Commonly known as: TOPROL-XL Take 1 tablet (100 mg total) by mouth daily. Take with or immediately following a meal.   nitroGLYCERIN 0.4 MG SL tablet Commonly known as: NITROSTAT Place 0.4 mg under the tongue every 5 (five) minutes as needed for chest pain.   Symbicort 80-4.5 MCG/ACT inhaler Generic drug: budesonide-formoterol INHALE 2 PUFFS INTO THE LUNGS TWICE DAILY   traZODone 50 MG tablet Commonly known as: DESYREL Take 50 mg by mouth at bedtime as needed for sleep.   trospium 20 MG tablet Commonly known as: SANCTURA Take 1 tablet (20 mg total) by mouth daily.   Ventolin HFA 108 (90 Base) MCG/ACT inhaler Generic drug: albuterol Inhale 2 puffs into the lungs every 6 (six) hours as needed for wheezing or shortness of breath.   vitamin  B-12 1000 MCG tablet Commonly known as: CYANOCOBALAMIN Take 1,000 mcg by mouth daily.   Vitamin D3 125 MCG (5000 UT) Tabs Take 5,000 Units by mouth daily.       Follow-up Information    Lauro Regulus, MD. Schedule an appointment as soon as possible for a visit.   Specialty: Internal Medicine Contact information: [address] [facility] [place] [address] [phone]        Lamar Blinks, MD. Schedule an appointment as soon as possible for a visit in 1 week(s).   Specialty: Cardiology Contact information: [address] [facility] [place] [address] [phone]              Allergies  Allergen Reactions  . Diphenhydramine Rash    AGITATION/DELIRIUM  . Metformin Diarrhea  . Oxybutynin Other (See Comments)    dizzy  . Amlodipine Rash  . Ciprofloxacin Rash  . Lisinopril Rash  . Penicillins Rash    Family states was a "long time ago"  .  Saxagliptin Rash  . Sulfamethoxazole-Trimethoprim Rash    Consultations:  Cardiology  Procedures/Studies: CARDIAC CATHETERIZATION  Addendum Date: 07/02/2020    Ramus lesion is 100% stenosed.  Mid Cx to Dist Cx lesion is 60% stenosed.  Mid LAD lesion is 90% stenosed.  Prox RCA lesion is 30% stenosed.  Mid RCA lesion is 20% stenosed.  Dist RCA lesion is 50% stenosed.  A drug-eluting stent was successfully placed using a STENT RESOLUTE ONYX 2.25X26.  2nd Mrg-1 lesion is 100% stenosed.  Post intervention, there is a 0% residual stenosis.  2nd Mrg-2 lesion is 100% stenosed.  1.  Acute lateral ST elevation myocardial infarction due to an occluded OM 2.  There is also significant stenosis in the mid LAD which is heavily calcified.  The stent in the RCA are patent with mild in-stent restenosis. 2.  Left ventricular angiography was not performed.  We will obtain an echocardiogram. 3.  Severely elevated blood pressure and left ventricular end-diastolic pressure at 32 mmHg. 4.  Successful angioplasty and drug-eluting stent placement to OM 2.  The superior branch of this vessel seem to be occluded distally.  Distal distribution is overall small. Recommendations: Dual antiplatelet therapy for 12 months if tolerated. Aggressive treatment of risk factors. Patient had significant dyspnea and orthopnea by the end of the case and given significantly elevated LVEDP, she was given 1 dose of furosemide 20 mg IV.  Check an echocardiogram. The patient has severe disease in the mid LAD but I doubt that she will be a candidate for staged PCI and atherectomy given her frail status and comorbidities.   Result Date: 07/02/2020  Ramus lesion is 100% stenosed.  Mid Cx to Dist Cx lesion is 60% stenosed.  Mid LAD lesion is 90% stenosed.  Prox RCA lesion is 30% stenosed.  Mid RCA lesion is 20% stenosed.  Dist RCA lesion is 50% stenosed.  A drug-eluting stent was successfully placed using a STENT RESOLUTE ONYX 2.25X26.   2nd Mrg-1 lesion is 100% stenosed.  Post intervention, there is a 0% residual stenosis.  2nd Mrg-2 lesion is 100% stenosed.  1.  Acute lateral ST elevation myocardial infarction due to an occluded OM 2.  There is also significant stenosis in the mid LAD which is heavily calcified.  The stent in the RCA are patent with mild in-stent restenosis. 2.  Left ventricular angiography was not performed.  We will obtain an echocardiogram. 3.  Severely elevated blood pressure and left ventricular end-diastolic pressure at 32 mmHg. 4.  Successful angioplasty and drug-eluting stent placement to OM 2.  The superior branch of this vessel seem to be occluded distally.  Distal distribution is overall small. Recommendations: Dual antiplatelet therapy for 12 months if tolerated. Aggressive treatment of risk factors. The patient has severe disease in the mid LAD but I doubt that she will be a candidate for staged PCI and atherectomy given her frail status and comorbidities.  ECHOCARDIOGRAM COMPLETE  Result Date: 07/03/2020    ECHOCARDIOGRAM REPORT   Patient Name:   PATRECE TALLIE Date of Exam: 07/02/2020 Medical Rec #:  [id number]   Height:       62.0 in Accession #:    [id number]  Weight:       167.5 lb Date of Birth:  06/26/1928   BSA:          1.773 m Patient Age:    87 years    BP:           192/90 mmHg Patient Gender: F           HR:           67 bpm. Exam Location:  [facility] Procedure: 2D Echo, Cardiac Doppler and Color Doppler Indications:     Acute myocardial infarction, unspecified I21.9  History:         Patient has prior history of Echocardiogram examinations. TIA;                  Risk Factors:Hypertension.  Sonographer:     Neysa Bonito Roar Referring Phys:  [id number] A ARIDA Diagnosing Phys: Arnoldo Hooker MD IMPRESSIONS  1. Left ventricular ejection fraction, by estimation, is 35 to 40%. The left ventricle has moderately decreased function. The left ventricle demonstrates regional wall motion abnormalities (see scoring  diagram/findings for description). Left ventricular  diastolic parameters were normal.  2. Right ventricular systolic function is normal. The right ventricular size is normal.  3. Left atrial size was mildly dilated.  4. The mitral valve is normal in structure. Moderate mitral valve regurgitation.  5. The aortic valve is normal in structure. Aortic valve regurgitation is not visualized. FINDINGS  Left Ventricle: Left ventricular ejection fraction, by estimation, is 35 to 40%. The left ventricle has moderately decreased function. The left ventricle demonstrates regional wall motion abnormalities. Severe akinesis of the left ventricular, entire lateral wall. Mild hypokinesis of the left ventricular, mid-apical inferolateral wall. The left ventricular internal cavity size was normal in size. There is no left ventricular hypertrophy. Left ventricular diastolic parameters were normal. Right Ventricle: The right ventricular size is normal. No increase in right ventricular wall thickness. Right ventricular systolic function is normal. Left Atrium: Left atrial size was mildly dilated. Right Atrium: Right atrial size was normal in size. Pericardium: There is no evidence of pericardial effusion. Mitral Valve: The mitral valve is normal in structure. Moderate mitral valve regurgitation. Tricuspid Valve: The tricuspid valve is normal in structure. Tricuspid valve regurgitation is mild. Aortic Valve: The aortic valve is normal in structure. Aortic valve regurgitation is not visualized. Aortic valve peak gradient measures 8.5 mmHg. Pulmonic Valve: The pulmonic valve was normal in structure. Pulmonic valve regurgitation is trivial. Aorta: The aortic root and ascending aorta are structurally normal, with no evidence of dilitation. IAS/Shunts: No atrial level shunt detected by color flow Doppler.  LEFT VENTRICLE PLAX 2D LVIDd:         4.79 cm  Diastology  LVIDs:         3.67 cm  LV e' medial:  4.03 cm/s LV PW:         1.18 cm  LV e'  lateral: 6.53 cm/s LV IVS:        1.28 cm LVOT diam:     1.70 cm LVOT Area:     2.27 cm  RIGHT VENTRICLE RV Mid diam:    3.11 cm RV S prime:     12.00 cm/s TAPSE (M-mode): 2.1 cm LEFT ATRIUM             Index       RIGHT ATRIUM           Index LA diam:        3.80 cm 2.14 cm/m  RA Area:     11.40 cm LA Vol (A2C):   76.0 ml 42.86 ml/m RA Volume:   20.80 ml  11.73 ml/m LA Vol (A4C):   67.4 ml 38.01 ml/m LA Biplane Vol: 73.9 ml 41.68 ml/m  AORTIC VALVE                PULMONIC VALVE AV Area (Vmax): 1.00 cm    PV Vmax:        0.66 m/s AV Vmax:        146.00 cm/s PV Peak grad:   1.8 mmHg AV Peak Grad:   8.5 mmHg    RVOT Peak grad: 1 mmHg LVOT Vmax:      64.50 cm/s  MITRAL VALVE               TRICUSPID VALVE MV Area (PHT): 4.36 cm    TR Peak grad:   38.9 mmHg MV Decel Time: 174 msec    TR Vmax:        312.00 cm/s MV A velocity: 96.30 cm/s MV A Prime:    8.7 cm/s    SHUNTS                            Systemic Diam: 1.70 cm Arnoldo Hooker MD Electronically signed by Arnoldo Hooker MD Signature Date/Time: 07/03/2020/8:26:24 AM    Final      Subjective: Patient was feeling better when seen today.  No new complaint.  No chest pain or shortness of breath.  Wants to go home.  Able to ambulate without any chest discomfort or hypoxia.  Discharge Exam: Vitals:   07/04/20 0733 07/04/20 1111  BP: 104/61 100/63  Pulse: 100 98  Resp: 17 17  Temp: 98.2 F (36.8 C) 98.1 F (36.7 C)  SpO2: 92% 94%   Vitals:   07/03/20 2006 07/04/20 0329 07/04/20 0733 07/04/20 1111  BP: 105/62 (!) 152/58 104/61 100/63  Pulse: 87 66 100 98  Resp: 16 16 17 17   Temp: 98.2 F (36.8 C) 98 F (36.7 C) 98.2 F (36.8 C) 98.1 F (36.7 C)  TempSrc:   Oral Oral  SpO2: 97% 97% 92% 94%  Weight:  75.2 kg    Height:        General: Pt is alert, awake, not in acute distress Cardiovascular: Irregularly irregular, no rubs, no gallops Respiratory: CTA bilaterally, no wheezing, no rhonchi Abdominal: Soft, NT, ND, bowel sounds  + Extremities: no edema, no cyanosis   The results of significant diagnostics from this hospitalization (including imaging, microbiology, ancillary and laboratory) are listed below for reference.    Microbiology: Recent Results (from the past 240 hour(s))  SARS Coronavirus 2 by RT PCR (hospital order, performed in [facility] hospital lab) Nasopharyngeal Nasopharyngeal Swab     Status: None   Collection Time: 07/02/20  2:18 PM   Specimen: Nasopharyngeal Swab  Result Value Ref Range Status   SARS Coronavirus 2 NEGATIVE NEGATIVE Final    Comment: (NOTE) SARS-CoV-2 target nucleic acids are NOT DETECTED.  The SARS-CoV-2 RNA is generally detectable in upper and lower respiratory specimens during the acute phase of infection. The lowest concentration of SARS-CoV-2 viral copies this assay can detect is 250 copies / mL. A negative result does not preclude SARS-CoV-2 infection and should not be used as the sole basis for treatment or other patient management decisions.  A negative result may occur with improper specimen collection / handling, submission of specimen other than nasopharyngeal swab, presence of viral mutation(s) within the areas targeted by this assay, and inadequate number of viral copies (<250 copies / mL). A negative result must be combined with clinical observations, patient history, and epidemiological information.  Fact Sheet for Patients:   BoilerBrush.com.cy  Fact Sheet for Healthcare Providers: https://pope.com/  This test is not yet approved or  cleared by the [place] FDA and has been authorized for detection and/or diagnosis of SARS-CoV-2 by FDA under an Emergency Use Authorization (EUA).  This EUA will remain in effect (meaning this test can be used) for the duration of the COVID-19 declaration under Section 564(b)(1) of the Act, 21 U.S.C. section 360bbb-3(b)(1), unless the authorization is terminated  or revoked sooner.  Performed at [facility], [address] [place], [place] [address]   MRSA PCR Screening     Status: None   Collection Time: 07/02/20  4:02 PM   Specimen: Nasopharyngeal  Result Value Ref Range Status   MRSA by PCR NEGATIVE NEGATIVE Final    Comment:        The GeneXpert MRSA Assay (FDA approved for NASAL specimens only), is one component of a comprehensive MRSA colonization surveillance program. It is not intended to diagnose MRSA infection nor to guide or monitor treatment for MRSA infections. Performed at [facility], [address] [place], [place] [address]      Labs: BNP (last 3 results) Recent Labs    03/30/20 1045 04/14/20 1154  BNP 395.9* 478.7*   Basic Metabolic Panel: Recent Labs  Lab 07/02/20 1416 07/02/20 1615 07/03/20 0523  NA 139  --  135  K 4.0  --  4.5  CL 102  --  97*  CO2 26  --  25  GLUCOSE 101*  --  166*  BUN 14  --  15  CREATININE 0.95 1.05* 1.02*  CALCIUM 9.4  --  8.7*   Liver Function Tests: No results for input(s): AST, ALT, ALKPHOS, BILITOT, PROT, ALBUMIN in the last 168 hours. No results for input(s): LIPASE, AMYLASE in the last 168 hours. No results for input(s): AMMONIA in the last 168 hours. CBC: Recent Labs  Lab 07/02/20 1416 07/02/20 1615 07/03/20 0523  WBC 10.5 13.0* 14.3*  NEUTROABS 7.0  --   --   HGB 11.7* 12.2 11.4*  HCT 39.5 41.6 37.6  MCV 79.8* 79.7* 77.7*  PLT 352 341 338   Cardiac Enzymes: No results for input(s): CKTOTAL, CKMB, CKMBINDEX, TROPONINI in the last 168 hours. BNP: Invalid input(s): POCBNP CBG: Recent Labs  Lab 07/03/20 0911 07/03/20 1127 07/03/20 1606 07/04/20 0912 07/04/20 1124  GLUCAP 264* 192* 183* 263* 311*   D-Dimer No results for input(s):  DDIMER in the last 72 hours. Hgb A1c No results for input(s): HGBA1C in the last 72 hours. Lipid Profile No results for input(s): CHOL, HDL, LDLCALC, TRIG, CHOLHDL, LDLDIRECT in the last 72  hours. Thyroid function studies No results for input(s): TSH, T4TOTAL, T3FREE, THYROIDAB in the last 72 hours.  Invalid input(s): FREET3 Anemia work up No results for input(s): VITAMINB12, FOLATE, FERRITIN, TIBC, IRON, RETICCTPCT in the last 72 hours. Urinalysis    Component Value Date/Time   COLORURINE STRAW (A) 03/30/2020 2030   APPEARANCEUR CLEAR (A) 03/30/2020 2030   APPEARANCEUR Hazy 08/11/2012 1201   LABSPEC 1.011 03/30/2020 2030   LABSPEC 1.020 08/11/2012 1201   PHURINE 6.0 03/30/2020 2030   GLUCOSEU >=500 (A) 03/30/2020 2030   GLUCOSEU Negative 08/11/2012 1201   HGBUR NEGATIVE 03/30/2020 2030   BILIRUBINUR NEGATIVE 03/30/2020 2030   BILIRUBINUR neg 02/11/2015 0950   BILIRUBINUR Negative 08/11/2012 1201   KETONESUR NEGATIVE 03/30/2020 2030   PROTEINUR NEGATIVE 03/30/2020 2030   UROBILINOGEN 0.2 02/11/2015 0950   NITRITE NEGATIVE 03/30/2020 2030   LEUKOCYTESUR NEGATIVE 03/30/2020 2030   LEUKOCYTESUR 1+ 08/11/2012 1201   Sepsis Labs Invalid input(s): PROCALCITONIN,  WBC,  LACTICIDVEN Microbiology Recent Results (from the past 240 hour(s))  SARS Coronavirus 2 by RT PCR (hospital order, performed in [facility] hospital lab) Nasopharyngeal Nasopharyngeal Swab     Status: None   Collection Time: 07/02/20  2:18 PM   Specimen: Nasopharyngeal Swab  Result Value Ref Range Status   SARS Coronavirus 2 NEGATIVE NEGATIVE Final    Comment: (NOTE) SARS-CoV-2 target nucleic acids are NOT DETECTED.  The SARS-CoV-2 RNA is generally detectable in upper and lower respiratory specimens during the acute phase of infection. The lowest concentration of SARS-CoV-2 viral copies this assay can detect is 250 copies / mL. A negative result does not preclude SARS-CoV-2 infection and should not be used as the sole basis for treatment or other patient management decisions.  A negative result may occur with improper specimen collection / handling, submission of specimen other than  nasopharyngeal swab, presence of viral mutation(s) within the areas targeted by this assay, and inadequate number of viral copies (<250 copies / mL). A negative result must be combined with clinical observations, patient history, and epidemiological information.  Fact Sheet for Patients:   BoilerBrush.com.cy  Fact Sheet for Healthcare Providers: https://pope.com/  This test is not yet approved or  cleared by the [place] FDA and has been authorized for detection and/or diagnosis of SARS-CoV-2 by FDA under an Emergency Use Authorization (EUA).  This EUA will remain in effect (meaning this test can be used) for the duration of the COVID-19 declaration under Section 564(b)(1) of the Act, 21 U.S.C. section 360bbb-3(b)(1), unless the authorization is terminated or revoked sooner.  Performed at [facility], [address] [place], [place] [address]   MRSA PCR Screening     Status: None   Collection Time: 07/02/20  4:02 PM   Specimen: Nasopharyngeal  Result Value Ref Range Status   MRSA by PCR NEGATIVE NEGATIVE Final    Comment:        The GeneXpert MRSA Assay (FDA approved for NASAL specimens only), is one component of a comprehensive MRSA colonization surveillance program. It is not intended to diagnose MRSA infection nor to guide or monitor treatment for MRSA infections. Performed at [facility], [address], [place], [place] [address]     Time coordinating discharge: Over 30 minutes  SIGNED:  Arnetha Courser, MD  [facility] 07/04/2020, 12:24 PM  If 7PM-7AM, please contact night-coverage [email or web address]  This record has been created using Conservation officer, historic buildingsDragon voice recognition software. Errors have been sought and corrected,but may not always be located. Such creation errors do not reflect on the standard of care.

## 2020-07-04 NOTE — Progress Notes (Signed)
[facility] Encounter Note  Patient: Peggy [name] / Admit Date: 07/02/2020 / Date of Encounter: 07/04/2020, 8:51 AM   Subjective: Patient is feeling much better today with no further evidence of chest discomfort or shortness of breath.  Patient is ambulating well on appropriate medication management currently the patient has had a significant elevation of troponin consistent with ST elevation myocardial infarction.  Patient currently tolerating her medications well with telemetry showing normal sinus rhythm  2/2.  Patient overall feels fairly well but no evidence of significant new issues of chest discomfort or cardiovascular symptoms.  No evidence of heart failure.  Patient continues to have atrial fibrillation with rapid ventricular rate needing further medication management.  And we have discussed at length increasing amiodarone for possible spontaneous conversion to normal sinus rhythm in addition to beta-blocker.  Additionally the patient will need anticoagulation for further risk reduction in stroke with atrial fibrillation although she has had some bleeding complications in the past.  We will use dual therapy with Plavix alone and discontinuation of aspirin due to concerns of this bleeding issue  Echocardiogram showing mild LV systolic dysfunction with lateral akinesis and ejection fraction of 40% with mild to moderate mitral regurgitation  Review of Systems: Positive for: Shortness of breath Negative for: Vision change, hearing change, syncope, dizziness, nausea, vomiting,diarrhea, bloody stool, stomach pain, cough, congestion, diaphoresis, urinary frequency, urinary pain,skin lesions, skin rashes Others previously listed  Objective: Telemetry: Normal sinus rhythm Physical Exam: Blood pressure 104/61, pulse 100, temperature 98.2 F (36.8 C), temperature source Oral, resp. rate 17, height 5\' 2"  (1.575 m), weight 75.2 kg, SpO2 92 %. Body mass index is 30.33  kg/m. General: Well developed, well nourished, in no acute distress. Head: Normocephalic, atraumatic, sclera non-icteric, no xanthomas, nares are without discharge. Neck: No apparent masses Lungs: Normal respirations with no wheezes, no rhonchi, no rales , no crackles   Heart: Irregular rate and rhythm, normal S1 S2, apical murmur, no rub, no gallop, PMI is normal size and placement, carotid upstroke normal without bruit, jugular venous pressure normal Abdomen: Soft, non-tender, non-distended with normoactive bowel sounds. No hepatosplenomegaly. Abdominal aorta is normal size without bruit Extremities: Trace edema, no clubbing, no cyanosis, no ulcers,  Peripheral: 2+ radial, 2+ femoral, 2+ dorsal pedal pulses Neuro: Alert and oriented. Moves all extremities spontaneously. Psych:  Responds to questions appropriately with a normal affect.   Intake/Output Summary (Last 24 hours) at 07/04/2020 0851 Last data filed at 07/04/2020 0732 Gross per 24 hour  Intake 0 ml  Output 1350 ml  Net -1350 ml    Inpatient Medications:  . ALPRAZolam  0.25 mg Oral QHS  . amiodarone  200 mg Oral BID  . apixaban  5 mg Oral BID  . atorvastatin  80 mg Oral Daily  . Chlorhexidine Gluconate Cloth  6 each Topical Daily  . cholecalciferol  5,000 Units Oral Daily  . clopidogrel  75 mg Oral Q breakfast  . ferrous sulfate  325 mg Oral Q breakfast  . gabapentin  100 mg Oral QHS  . insulin aspart protamine- aspart  35 Units Subcutaneous BID WC  . irbesartan  150 mg Oral Daily  . isosorbide mononitrate  30 mg Oral Daily  . levothyroxine  50 mcg Oral QAC breakfast  . mouth rinse  15 mL Mouth Rinse BID  . melatonin  5 mg Oral QHS  . metoprolol succinate  100 mg Oral Daily  . mometasone-formoterol  2 puff Inhalation BID  . pantoprazole  40 mg Oral Daily  . sodium chloride flush  3 mL Intravenous Q12H  . vitamin B-12  1,000 mcg Oral Daily   Infusions:  . sodium chloride      Labs: Recent Labs    07/02/20 1416  07/02/20 1615 07/03/20 0523  NA 139  --  135  K 4.0  --  4.5  CL 102  --  97*  CO2 26  --  25  GLUCOSE 101*  --  166*  BUN 14  --  15  CREATININE 0.95 1.05* 1.02*  CALCIUM 9.4  --  8.7*   No results for input(s): AST, ALT, ALKPHOS, BILITOT, PROT, ALBUMIN in the last 72 hours. Recent Labs    07/02/20 1416 07/02/20 1615 07/03/20 0523  WBC 10.5 13.0* 14.3*  NEUTROABS 7.0  --   --   HGB 11.7* 12.2 11.4*  HCT 39.5 41.6 37.6  MCV 79.8* 79.7* 77.7*  PLT 352 341 338   No results for input(s): CKTOTAL, CKMB, TROPONINI in the last 72 hours. Invalid input(s): POCBNP No results for input(s): HGBA1C in the last 72 hours.   Weights: Filed Weights   07/02/20 1413 07/04/20 0329  Weight: 76 kg 75.2 kg     Radiology/Studies:  CARDIAC CATHETERIZATION  Addendum Date: 07/02/2020    Ramus lesion is 100% stenosed.  Mid Cx to Dist Cx lesion is 60% stenosed.  Mid LAD lesion is 90% stenosed.  Prox RCA lesion is 30% stenosed.  Mid RCA lesion is 20% stenosed.  Dist RCA lesion is 50% stenosed.  A drug-eluting stent was successfully placed using a STENT RESOLUTE ONYX 2.25X26.  2nd Mrg-1 lesion is 100% stenosed.  Post intervention, there is a 0% residual stenosis.  2nd Mrg-2 lesion is 100% stenosed.  1.  Acute lateral ST elevation myocardial infarction due to an occluded OM 2.  There is also significant stenosis in the mid LAD which is heavily calcified.  The stent in the RCA are patent with mild in-stent restenosis. 2.  Left ventricular angiography was not performed.  We will obtain an echocardiogram. 3.  Severely elevated blood pressure and left ventricular end-diastolic pressure at 32 mmHg. 4.  Successful angioplasty and drug-eluting stent placement to OM 2.  The superior branch of this vessel seem to be occluded distally.  Distal distribution is overall small. Recommendations: Dual antiplatelet therapy for 12 months if tolerated. Aggressive treatment of risk factors. Patient had significant  dyspnea and orthopnea by the end of the case and given significantly elevated LVEDP, she was given 1 dose of furosemide 20 mg IV.  Check an echocardiogram. The patient has severe disease in the mid LAD but I doubt that she will be a candidate for staged PCI and atherectomy given her frail status and comorbidities.   Result Date: 07/02/2020  Ramus lesion is 100% stenosed.  Mid Cx to Dist Cx lesion is 60% stenosed.  Mid LAD lesion is 90% stenosed.  Prox RCA lesion is 30% stenosed.  Mid RCA lesion is 20% stenosed.  Dist RCA lesion is 50% stenosed.  A drug-eluting stent was successfully placed using a STENT RESOLUTE ONYX 2.25X26.  2nd Mrg-1 lesion is 100% stenosed.  Post intervention, there is a 0% residual stenosis.  2nd Mrg-2 lesion is 100% stenosed.  1.  Acute lateral ST elevation myocardial infarction due to an occluded OM 2.  There is also significant stenosis in the mid LAD which is heavily calcified.  The stent in the RCA are patent with mild in-stent  restenosis. 2.  Left ventricular angiography was not performed.  We will obtain an echocardiogram. 3.  Severely elevated blood pressure and left ventricular end-diastolic pressure at 32 mmHg. 4.  Successful angioplasty and drug-eluting stent placement to OM 2.  The superior branch of this vessel seem to be occluded distally.  Distal distribution is overall small. Recommendations: Dual antiplatelet therapy for 12 months if tolerated. Aggressive treatment of risk factors. The patient has severe disease in the mid LAD but I doubt that she will be a candidate for staged PCI and atherectomy given her frail status and comorbidities.  ECHOCARDIOGRAM COMPLETE  Result Date: 07/03/2020    ECHOCARDIOGRAM REPORT   Patient Name:   JAZIA FARACI Date of Exam: 07/02/2020 Medical Rec #:  [id number]   Height:       62.0 in Accession #:    [id number]  Weight:       167.5 lb Date of Birth:  26-Nov-1928   BSA:          1.773 m Patient Age:    89 years    BP:           192/90  mmHg Patient Gender: F           HR:           67 bpm. Exam Location:  [facility] Procedure: 2D Echo, Cardiac Doppler and Color Doppler Indications:     Acute myocardial infarction, unspecified I21.9  History:         Patient has prior history of Echocardiogram examinations. TIA;                  Risk Factors:Hypertension.  Sonographer:     Neysa Bonito Roar Referring Phys:  [id number] A ARIDA Diagnosing Phys: Arnoldo Hooker MD IMPRESSIONS  1. Left ventricular ejection fraction, by estimation, is 35 to 40%. The left ventricle has moderately decreased function. The left ventricle demonstrates regional wall motion abnormalities (see scoring diagram/findings for description). Left ventricular  diastolic parameters were normal.  2. Right ventricular systolic function is normal. The right ventricular size is normal.  3. Left atrial size was mildly dilated.  4. The mitral valve is normal in structure. Moderate mitral valve regurgitation.  5. The aortic valve is normal in structure. Aortic valve regurgitation is not visualized. FINDINGS  Left Ventricle: Left ventricular ejection fraction, by estimation, is 35 to 40%. The left ventricle has moderately decreased function. The left ventricle demonstrates regional wall motion abnormalities. Severe akinesis of the left ventricular, entire lateral wall. Mild hypokinesis of the left ventricular, mid-apical inferolateral wall. The left ventricular internal cavity size was normal in size. There is no left ventricular hypertrophy. Left ventricular diastolic parameters were normal. Right Ventricle: The right ventricular size is normal. No increase in right ventricular wall thickness. Right ventricular systolic function is normal. Left Atrium: Left atrial size was mildly dilated. Right Atrium: Right atrial size was normal in size. Pericardium: There is no evidence of pericardial effusion. Mitral Valve: The mitral valve is normal in structure. Moderate mitral valve regurgitation. Tricuspid  Valve: The tricuspid valve is normal in structure. Tricuspid valve regurgitation is mild. Aortic Valve: The aortic valve is normal in structure. Aortic valve regurgitation is not visualized. Aortic valve peak gradient measures 8.5 mmHg. Pulmonic Valve: The pulmonic valve was normal in structure. Pulmonic valve regurgitation is trivial. Aorta: The aortic root and ascending aorta are structurally normal, with no evidence of dilitation. IAS/Shunts: No atrial level shunt detected by color flow Doppler.  LEFT VENTRICLE PLAX 2D LVIDd:         4.79 cm  Diastology LVIDs:         3.67 cm  LV e' medial:  4.03 cm/s LV PW:         1.18 cm  LV e' lateral: 6.53 cm/s LV IVS:        1.28 cm LVOT diam:     1.70 cm LVOT Area:     2.27 cm  RIGHT VENTRICLE RV Mid diam:    3.11 cm RV S prime:     12.00 cm/s TAPSE (M-mode): 2.1 cm LEFT ATRIUM             Index       RIGHT ATRIUM           Index LA diam:        3.80 cm 2.14 cm/m  RA Area:     11.40 cm LA Vol (A2C):   76.0 ml 42.86 ml/m RA Volume:   20.80 ml  11.73 ml/m LA Vol (A4C):   67.4 ml 38.01 ml/m LA Biplane Vol: 73.9 ml 41.68 ml/m  AORTIC VALVE                PULMONIC VALVE AV Area (Vmax): 1.00 cm    PV Vmax:        0.66 m/s AV Vmax:        146.00 cm/s PV Peak grad:   1.8 mmHg AV Peak Grad:   8.5 mmHg    RVOT Peak grad: 1 mmHg LVOT Vmax:      64.50 cm/s  MITRAL VALVE               TRICUSPID VALVE MV Area (PHT): 4.36 cm    TR Peak grad:   38.9 mmHg MV Decel Time: 174 msec    TR Vmax:        312.00 cm/s MV A velocity: 96.30 cm/s MV A Prime:    8.7 cm/s    SHUNTS                            Systemic Diam: 1.70 cm Arnoldo Hooker MD Electronically signed by Arnoldo Hooker MD Signature Date/Time: 07/03/2020/8:26:24 AM    Final      Assessment and Recommendation  87 y.o. female with acute ST elevation myocardial infarction with lateral myocardial infarction hypertension hyperlipidemia paroxysmal nonvalvular atrial fibrillation without evidence of congestive heart failure now  with atrial fibrillation with more rapid ventricular rate 1.  Single antiplatelet therapy due to need in use of anticoagulation for atrial fibrillation with Plavix 75 mg each day 2.  Continue high intensity cholesterol therapy 3.  No further cardiac diagnostics necessary at this time 4.  Continuation of amiodarone but increasing dosage of amiodarone to 200 mg twice per day with continued use of metoprolol at a current dose due to reasonable heart rate control 5.  Anticoagulation with Eliquis 5 mg twice per day for atrial fibrillation with reduction in risk of stroke. 6.  Begin rehabilitation  Signed, Arnoldo Hooker M.D. FACC

## 2020-07-11 ENCOUNTER — Ambulatory Visit: Payer: Medicare Other | Admitting: Internal Medicine

## 2020-07-25 ENCOUNTER — Ambulatory Visit: Payer: Medicare Other | Admitting: Internal Medicine

## 2020-08-02 ENCOUNTER — Ambulatory Visit: Payer: Medicare Other | Admitting: Internal Medicine

## 2020-08-02 ENCOUNTER — Encounter: Payer: Self-pay | Admitting: Internal Medicine

## 2020-08-02 VITALS — BP 148/50 | HR 75 | Temp 97.7°F | Resp 16 | Ht 62.0 in | Wt 168.4 lb

## 2020-08-02 DIAGNOSIS — Z9981 Dependence on supplemental oxygen: Secondary | ICD-10-CM | POA: Diagnosis not present

## 2020-08-02 DIAGNOSIS — J45901 Unspecified asthma with (acute) exacerbation: Secondary | ICD-10-CM | POA: Diagnosis not present

## 2020-08-02 DIAGNOSIS — J441 Chronic obstructive pulmonary disease with (acute) exacerbation: Secondary | ICD-10-CM

## 2020-08-02 DIAGNOSIS — R0602 Shortness of breath: Secondary | ICD-10-CM

## 2020-08-02 MED ORDER — BUDESONIDE-FORMOTEROL FUMARATE 160-4.5 MCG/ACT IN AERO: 2.0000 | INHALATION_SPRAY | Freq: Two times a day (BID) | RESPIRATORY_TRACT | 3 refills | Status: DC

## 2020-08-02 MED ORDER — METHYLPREDNISOLONE ACETATE 80 MG/ML IJ SUSP
80.0000 mg | Freq: Once | INTRAMUSCULAR | Status: AC
  Administered 2020-08-02: 80 mg via INTRAMUSCULAR

## 2020-08-02 MED ORDER — HYDROCOD POLST-CPM POLST ER 10-8 MG/5ML PO SUER: ORAL | 0 refills | Status: DC

## 2020-08-02 NOTE — Progress Notes (Signed)
[facility] [address] [place], [place] [address]  Internal MEDICINE  Office Visit Note  Patient Name: Peggy [name]  [id number]  [id number]  Date of Service: 08/13/2020  Chief Complaint  Patient presents with  . Follow-up    Cough worse at night, heart attack beginning of February   . COPD    HPI Pt is seen for pulmonary follow up as X-coverage for Dr Freda Munro. She was recently hospitalized with STEMI involving the lateral leads, taken for cardiac catheterization,had multivessel diseases/p PCI of OM2. Echocardiogram next day with EF of 35 to 40%, lateral wall akinesia and mild to moderate mitral regurgitation. Patient tolerated the procedure well. Some dose and medicine adjustment was done by cardiology, she is on amiodarone and metoprolol, was taken off of Cardia. Cardiology will decide if pt needs DAPT. Has app  She is having more cough and wheezing, has been out of her Symbicort and has been using only neb treatment  Denies any sputum production, fever or chills   Current Medication: Outpatient Encounter Medications as of 08/02/2020  Medication Sig  . acetaminophen (TYLENOL) 500 MG tablet Take 500-1,000 mg by mouth every 6 (six) hours as needed for mild pain or moderate pain.  Marland Kitchen ALPRAZolam (XANAX) 0.25 MG tablet TAKE ONE TAB PO QHS FOR SLEEP (Patient taking differently: Take 0.25 mg by mouth at bedtime.)  . amiodarone (PACERONE) 200 MG tablet Take 1 tablet (200 mg total) by mouth 2 (two) times daily.  Marland Kitchen atorvastatin (LIPITOR) 80 MG tablet Take 1 tablet (80 mg total) by mouth daily.  . budesonide-formoterol (SYMBICORT) 160-4.5 MCG/ACT inhaler Inhale 2 puffs into the lungs 2 (two) times daily.  . chlorpheniramine-HYDROcodone (TUSSIONEX PENNKINETIC ER) 10-8 MG/5ML SUER Take 5 cc at bedtime at bed time  . Cholecalciferol (VITAMIN D3) 5000 units TABS Take 5,000 Units by mouth daily.  . clopidogrel (PLAVIX) 75 MG tablet Take 1 tablet (75 mg total) by mouth daily with  breakfast.  . diphenoxylate-atropine (LOMOTIL) 2.5-0.025 MG tablet Take 1 tablet by mouth 2 (two) times daily as needed for diarrhea or loose stools.  Marland Kitchen esomeprazole (NEXIUM) 20 MG capsule Take 20 mg by mouth 2 (two) times daily.  Marland Kitchen gabapentin (NEURONTIN) 100 MG capsule Take 100 mg by mouth at bedtime.   Marland Kitchen HUMULIN 70/30 KWIKPEN (70-30) 100 UNIT/ML KwikPen Inject 35 Units into the skin 2 (two) times daily.  . hydrOXYzine (ATARAX/VISTARIL) 25 MG tablet Take 25 mg by mouth 4 (four) times daily as needed for itching.  Marland Kitchen ipratropium-albuterol (DUONEB) 0.5-2.5 (3) MG/3ML SOLN Take 3 mLs by nebulization every 6 (six) hours as needed (SOB).  . irbesartan (AVAPRO) 150 MG tablet Take 1 tablet (150 mg total) by mouth daily.  . isosorbide mononitrate (IMDUR) 30 MG 24 hr tablet Take 30 mg by mouth daily.  Marland Kitchen levothyroxine (SYNTHROID) 50 MCG tablet Take 50 mcg by mouth daily before breakfast.  . metoprolol succinate (TOPROL-XL) 100 MG 24 hr tablet Take 1 tablet (100 mg total) by mouth daily. Take with or immediately following a meal.  . nitroGLYCERIN (NITROSTAT) 0.4 MG SL tablet Place 0.4 mg under the tongue every 5 (five) minutes as needed for chest pain.   . traZODone (DESYREL) 50 MG tablet Take 50 mg by mouth at bedtime as needed for sleep.   . trospium (SANCTURA) 20 MG tablet Take 1 tablet (20 mg total) by mouth daily.  . VENTOLIN HFA 108 (90 Base) MCG/ACT inhaler Inhale 2 puffs into the lungs every 6 (  six) hours as needed for wheezing or shortness of breath.   . vitamin B-12 (CYANOCOBALAMIN) 1000 MCG tablet Take 1,000 mcg by mouth daily.  . [DISCONTINUED] SYMBICORT 80-4.5 MCG/ACT inhaler INHALE 2 PUFFS INTO THE LUNGS TWICE DAILY  . [DISCONTINUED] apixaban (ELIQUIS) 5 MG TABS tablet Take 1 tablet (5 mg total) by mouth 2 (two) times daily. (Patient not taking: Reported on 08/02/2020)  . [DISCONTINUED] ferrous sulfate 325 (65 FE) MG tablet Take 325 mg by mouth daily with breakfast. (Patient not taking: No sig  reported)  . [EXPIRED] methylPREDNISolone acetate (DEPO-MEDROL) injection 80 mg    No facility-administered encounter medications on file as of 08/02/2020.    Surgical History: Past Surgical History:  Procedure Laterality Date  . ABDOMINAL HYSTERECTOMY  1970  . BACK SURGERY  2013  . colectomy Right 10/08/2013   Dr. Egbert Garibaldi  . CORONARY ANGIOPLASTY WITH STENT PLACEMENT    . CORONARY/GRAFT ACUTE MI REVASCULARIZATION N/A 07/02/2020   Procedure: Coronary/Graft Acute MI Revascularization;  Surgeon: Iran Ouch, MD;  Location: [facility] INVASIVE CV LAB;  Service: Cardiovascular;  Laterality: N/A;  . LEFT HEART CATH AND CORONARY ANGIOGRAPHY N/A 07/02/2020   Procedure: LEFT HEART CATH AND CORONARY ANGIOGRAPHY;  Surgeon: Iran Ouch, MD;  Location: [facility] INVASIVE CV LAB;  Service: Cardiovascular;  Laterality: N/A;    Medical History: Past Medical History:  Diagnosis Date  . A-fib (HCC)   . Allergy   . Arthritis   . COPD (chronic obstructive pulmonary disease) (HCC)   . Diabetes mellitus without complication (HCC)   . GERD (gastroesophageal reflux disease)   . Hyperlipidemia   . Hypertension   . Myocardial infarction (HCC)   . Oxygen dependent    2 liters  . Stroke [facility])    5 years ago per daughter  . TIA (transient ischemic attack)     Family History: Family History  Problem Relation Age of Onset  . Breast cancer Mother   . Pancreatitis Father   . Breast cancer Sister   . Lung cancer Brother   . Melanoma Brother   . Throat cancer Brother     Social History   Socioeconomic History  . Marital status: Widowed    Spouse name: Not on file  . Number of children: 3  . Years of education: H/S  . Highest education level: Not on file  Occupational History  . Occupation: Retired  Tobacco Use  . Smoking status: Former Smoker    Packs/day: 1.00    Years: 30.00    Pack years: 30.00    Quit date: 11/30/1999    Years since quitting: 20.7  . Smokeless tobacco: Never Used  Vaping  Use  . Vaping Use: Never used  Substance and Sexual Activity  . Alcohol use: No  . Drug use: No  . Sexual activity: Not on file  Other Topics Concern  . Not on file  Social History Narrative  . Not on file   Social Determinants of Health   Financial Resource Strain: Not on file  Food Insecurity: Not on file  Transportation Needs: Not on file  Physical Activity: Not on file  Stress: Not on file  Social Connections: Not on file  Intimate Partner Violence: Not on file      Review of Systems  Constitutional: Negative for chills, diaphoresis and fatigue.  HENT: Negative for ear pain, postnasal drip and sinus pressure.   Eyes: Positive for pain. Negative for visual disturbance.  Respiratory: Positive for cough and shortness of  breath. Negative for wheezing.   Cardiovascular: Negative for chest pain, palpitations and leg swelling.  Skin: Negative for color change.  Allergic/Immunologic: Positive for environmental allergies. Negative for food allergies.  Neurological: Negative for dizziness and headaches.  Hematological: Does not bruise/bleed easily.  Psychiatric/Behavioral: Negative for agitation, behavioral problems (depression) and hallucinations.    Vital Signs: BP (!) 148/50   Pulse 75   Temp 97.7 F (36.5 C)   Resp 16   Ht 5\' 2"  (1.575 m)   Wt 168 lb 6.4 oz (76.4 kg)   SpO2 98%   BMI 30.80 kg/m    Physical Exam Constitutional:      General: She is not in acute distress.    Appearance: She is well-developed. She is not diaphoretic.  HENT:     Head: Normocephalic and atraumatic.     Nose: Congestion present.     Mouth/Throat:     Pharynx: No oropharyngeal exudate.  Eyes:     Extraocular Movements: Extraocular movements intact.     Pupils: Pupils are equal, round, and reactive to light.  Neck:     Thyroid: No thyromegaly.     Vascular: No JVD.     Trachea: No tracheal deviation.  Cardiovascular:     Rate and Rhythm: Normal rate and regular rhythm.      Heart sounds: Normal heart sounds. No murmur heard. No friction rub. No gallop.   Pulmonary:     Effort: Pulmonary effort is normal. No respiratory distress.     Breath sounds: Wheezing and rales present.  Chest:     Chest wall: No tenderness.  Musculoskeletal:        General: Normal range of motion.     Cervical back: Normal range of motion and neck supple.  Lymphadenopathy:     Cervical: No cervical adenopathy.  Skin:    General: Skin is warm and dry.  Neurological:     Mental Status: She is alert and oriented to person, place, and time.     Cranial Nerves: No cranial nerve deficit.  Psychiatric:        Behavior: Behavior normal.        Thought Content: Thought content normal.        Judgment: Judgment normal.        Assessment/Plan: 1. Acute exacerbation of COPD with asthma (HCC) Start therapy as prescribed today, monitor response  - chlorpheniramine-HYDROcodone (TUSSIONEX PENNKINETIC ER) 10-8 MG/5ML SUER; Take 5 cc at bedtime at bed time  Dispense: 140 mL; Refill: 0 - budesonide-formoterol (SYMBICORT) 160-4.5 MCG/ACT inhaler; Inhale 2 puffs into the lungs 2 (two) times daily.  Dispense: 1 each; Refill: 3 - Spirometry with Graph - methylPREDNISolone acetate (DEPO-MEDROL) injection 80 mg  2. Oxygen dependent Continue O2  General Counseling: Dareth verbalizes understanding of the findings of todays visit and agrees with plan of treatment. I have discussed any further diagnostic evaluation that may be needed or ordered today. We also reviewed her medications today. she has been encouraged to call the office with any questions or concerns that should arise related to todays visit.    Orders Placed This Encounter  Procedures  . Spirometry with Graph    Meds ordered this encounter  Medications  . chlorpheniramine-HYDROcodone (TUSSIONEX PENNKINETIC ER) 10-8 MG/5ML SUER    Sig: Take 5 cc at bedtime at bed time    Dispense:  140 mL    Refill:  0  . budesonide-formoterol  (SYMBICORT) 160-4.5 MCG/ACT inhaler    Sig: Inhale 2  puffs into the lungs 2 (two) times daily.    Dispense:  1 each    Refill:  3  . methylPREDNISolone acetate (DEPO-MEDROL) injection 80 mg    Total time spent:30 Minutes Time spent includes review of chart, medications, test results, and follow up plan with the patient.    Controlled Substance Database was reviewed by me.   Dr Lyndon Code Internal medicine

## 2020-08-03 NOTE — Progress Notes (Signed)
Patient was given 60 mg of depo-medrol in left gluteus.

## 2020-08-15 ENCOUNTER — Other Ambulatory Visit: Payer: Self-pay

## 2020-08-15 ENCOUNTER — Other Ambulatory Visit: Payer: Medicare Other | Admitting: Primary Care

## 2020-08-15 DIAGNOSIS — R269 Unspecified abnormalities of gait and mobility: Secondary | ICD-10-CM

## 2020-08-15 DIAGNOSIS — Z515 Encounter for palliative care: Secondary | ICD-10-CM

## 2020-08-15 DIAGNOSIS — I639 Cerebral infarction, unspecified: Secondary | ICD-10-CM

## 2020-08-15 DIAGNOSIS — I4891 Unspecified atrial fibrillation: Secondary | ICD-10-CM

## 2020-08-15 NOTE — Progress Notes (Signed)
[facility] Consult Note Telephone: [phone]  Fax: [phone]    Date of encounter: 08/15/20 PATIENT NAME: Peggy [name] [address] [place] [address] [phone] (home)  DOB: [birth year] MRN: [id number]  PRIMARY CARE PROVIDER:    Kirk Ruths, MD,  [place] [address] [phone]  REFERRING PROVIDER:   Kirk Ruths, MD [address],  [place] [address] [phone]  RESPONSIBLE PARTY:   Extended Emergency Contact Information Primary Emergency Contact: McDonald,Kathy Mobile Phone: [phone] Relation: Daughter Secondary Emergency Contact: Rector,Michelle  [place] Phone: [phone] Relation: Daughter  I met face to face with patient and family in  home. Palliative Care was asked to follow this patient by consultation request of Kirk Ruths, MD to help address advance care planning and goals of care. This is a follow up  visit.   ASSESSMENT AND RECOMMENDATIONS:   1. Advance Care Planning/Goals of Care: Goals include to maximize quality of life and symptom management.  Reviewed advance directives, no changes per daughter.  2. Symptom Management:   Cough: Has had cough for a month, following heart attack.  On arb which could be culprit. For f/u with pulmonology tomorrow for more w/u. Had course of pred without resolution. Taking nebs per order and has started back on symbicort with good compliance. Lungs clear today, cough is barky. R/o viral bronchitis although patient is not ill appearing. Some DOE.Reviewed pulmonary function tests on record. Recommend f/u with pulmonology and cardiology for resolution.  Also encouraged to use air purifyer and start otc allergy meds as she does c/o PND and has allergies in this season.  Med review: On arb, other meds reviewed. Not taking diltiazem per  earlier prescription. Has ntg which she had to locate. Encouraged to have several bottles in the home.  Safety: Unsteady, uses cane in home but may benefit by using rollator, which she has.  3. Follow up Palliative Care Visit: Palliative care will continue to follow for goals of care clarification and symptom management. Return 6 weeks or prn.  4. Family /Caregiver/Community Supports: Lives with 1 daughter, other comes to visit weekly for appts.  5. Cognitive / Functional decline: A and O x 3, HOH. Able to do most adls, needs help with iadls.  I spent 60 minutes providing this consultation,  from 1400 to 1500. More than 50% of the time in this consultation was spent in counseling and care coordination.  CODE STATUS: DNr  PPS: 50%  HOSPICE ELIGIBILITY/DIAGNOSIS: TBD  Subjective:  CHIEF COMPLAINT: cough  HISTORY OF PRESENT ILLNESS:  Peggy [name] is a 87 y.o. year old female  with cough, h/o MI, DM. Today presents with cough and DOE, some loss of stamina. Has bronchodialators and ICS which she takes. Also has tussin for hs. Completed course of pred with some relief, but now  Cough is back. Had before arb Rx but may be exacerbated by this.   We are asked to consult around advance care planning and complex medical decision making.    Review and summarization of old Epic records shows or history from other than patient.  Review or lab tests, radiology,  or medicine. Labs, EKG, Echo and PFT. Review of case with family member. Daughter Juliann Pulse  History obtained from review of EMR, discussion with primary team, and  interview with family, caregiver  and/or Ms. Takach. Records  reviewed and summarized above.   CURRENT PROBLEM LIST:  Patient Active Problem List   Diagnosis Date Noted  . ST elevation myocardial infarction (STEMI) of lateral wall ([place]) 07/02/2020  . Abnormality of gait 05/02/2020  . Atrial fibrillation with rapid ventricular response ([place]) 04/14/2020  . Melena 04/14/2020  . COPD  with acute exacerbation ([place]) 03/30/2020  . Elevated troponin level 03/30/2020  . Leukocytosis 03/30/2020  . COPD (chronic obstructive pulmonary disease) ([place]) 03/22/2020  . Chest pain with high risk for cardiac etiology 11/09/2019  . Osteoarthritis of multiple joints 03/01/2019  . Uncontrolled hypertension 11/22/2018  . Primary osteoarthritis of both knees 09/07/2018  . Chronic respiratory failure with hypoxia ([place]) 10/08/2017  . Simple chronic bronchitis ([place]) 10/08/2017  . SOB (shortness of breath) 09/24/2017  . History of stroke 08/17/2017  . Acute respiratory failure with hypoxia and hypercarbia ([place]) 08/07/2017  . Respiratory failure ([place]) 08/07/2017  . History of GI bleed 07/07/2017  . Atrial fibrillation with RVR ([place]) 06/30/2017  . Bilateral carotid artery disease ([place]) 04/14/2017  . Healthcare maintenance 04/14/2017  . Dizziness 04/07/2017  . 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 TIA (transient ischemic attack) 12/10/2016  . CVA (cerebral vascular accident) ([place]) 12/09/2016  . Bilateral arm pain 11/27/2016  . Myocardial infarction ([place]) 07/30/2015  . Urinary frequency 02/09/2015  . RUQ abdominal pain 01/26/2015  . Shortness of breath 01/26/2015  . Anxiety 01/26/2015  . Chronic pruritus 12/21/2014  . Cerebral vascular accident ([place]) 11/16/2014  . Chronic low back pain 11/16/2014  . Adult hypothyroidism 11/15/2014  . Allergic rhinitis 11/15/2014  . Body mass index (BMI) of 28.0-28.9 in adult 11/15/2014  . Arthritis 11/15/2014  . Cramps of lower extremity 11/15/2014  . Essential (primary) hypertension 11/15/2014  . Acid reflux 11/15/2014  . Hypercholesteremia 11/15/2014  . Cannot sleep 11/15/2014  . Psoriasis 11/15/2014  . Itch of skin 11/15/2014  . Restless leg 11/15/2014  . Type 2 diabetes mellitus with other specified complication ([place]) 26/71/2458  . Breath shortness 11/15/2014  . Dermatitis due to unknown cause 04/10/2014  . CAD (coronary  artery disease) 02/20/2014  . AF (paroxysmal atrial fibrillation) ([place]) 02/20/2014  . Temporary cerebral vascular dysfunction 02/20/2014  . DD (diverticular disease) 10/05/2013   PAST MEDICAL HISTORY:  Active Ambulatory Problems    Diagnosis Date Noted  . Adult hypothyroidism 11/15/2014  . Allergic rhinitis 11/15/2014  . Body mass index (BMI) of 28.0-28.9 in adult 11/15/2014  . Arthritis 11/15/2014  . Cramps of lower extremity 11/15/2014  . DD (diverticular disease) 10/05/2013  . Essential (primary) hypertension 11/15/2014  . Acid reflux 11/15/2014  . Hypercholesteremia 11/15/2014  . Cannot sleep 11/15/2014  . Psoriasis 11/15/2014  . Itch of skin 11/15/2014  . Restless leg 11/15/2014  . Type 2 diabetes mellitus with other specified complication ([place]) 09/98/3382  . Breath shortness 11/15/2014  . CAD (coronary artery disease) 02/20/2014  . AF (paroxysmal atrial fibrillation) ([place]) 02/20/2014  . Cerebral vascular accident ([place]) 11/16/2014  . Temporary cerebral vascular dysfunction 02/20/2014  . Chronic low back pain 11/16/2014  . Chronic pruritus 12/21/2014  . RUQ abdominal pain 01/26/2015  . Shortness of breath 01/26/2015  . Anxiety 01/26/2015  . Urinary frequency 02/09/2015  . Myocardial infarction ([place]) 07/30/2015  . CVA (cerebral vascular accident) ([place]) 12/09/2016  . TIA (transient ischemic attack) 12/10/2016  . Atrial fibrillation with RVR ([place]) 06/30/2017  . Acute respiratory failure with hypoxia and hypercarbia ([place]) 08/07/2017  . Respiratory failure ([place]) 08/07/2017  .  Bilateral arm pain 11/27/2016  . Bilateral carotid artery disease ([place]) 04/14/2017  . Dermatitis due to unknown cause 04/10/2014  . Dizziness 04/07/2017  . Healthcare maintenance 04/14/2017  . History of GI bleed 07/07/2017  . History of stroke 08/17/2017  . 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 SOB (shortness of breath) 09/24/2017  . Chronic respiratory failure with hypoxia  ([place]) 10/08/2017  . Simple chronic bronchitis ([place]) 10/08/2017  . Uncontrolled hypertension 11/22/2018  . COPD (chronic obstructive pulmonary disease) ([place]) 03/22/2020  . COPD with acute exacerbation ([place]) 03/30/2020  . Elevated troponin level 03/30/2020  . Leukocytosis 03/30/2020  . Atrial fibrillation with rapid ventricular response ([place]) 04/14/2020  . Melena 04/14/2020  . Chest pain with high risk for cardiac etiology 11/09/2019  . Osteoarthritis of multiple joints 03/01/2019  . Primary osteoarthritis of both knees 09/07/2018  . Abnormality of gait 05/02/2020  . ST elevation myocardial infarction (STEMI) of lateral wall ([place]) 07/02/2020   Resolved Ambulatory Problems    Diagnosis Date Noted  . Atherosclerosis of coronary artery 11/15/2014  . Chronic pain 11/15/2014  . Diverticulitis of colon with perforation 11/15/2014  . Heart attack ([place]) 11/16/2014  . Flu vaccine need 03/02/2015  . Controlled type 2 diabetes mellitus without complication ([place]) 17/61/6073   Past Medical History:  Diagnosis Date  . A-fib ([place])   . Allergy   . Diabetes mellitus without complication ([place])   . GERD (gastroesophageal reflux disease)   . Hyperlipidemia   . Hypertension   . Oxygen dependent   . Stroke [facility])    SOCIAL HX:  Social History   Tobacco Use  . Smoking status: Former Smoker    Packs/day: 1.00    Years: 30.00    Pack years: 30.00    Quit date: 11/30/1999    Years since quitting: 20.7  . Smokeless tobacco: Never Used  Substance Use Topics  . Alcohol use: No   FAMILY HX:  Family History  Problem Relation Age of Onset  . Breast cancer Mother   . Pancreatitis Father   . Breast cancer Sister   . Lung cancer Brother   . Melanoma Brother   . Throat cancer Brother      ALLERGIES:  Allergies  Allergen Reactions  . Diphenhydramine Rash    AGITATION/DELIRIUM  . Metformin Diarrhea  . Oxybutynin Other (See Comments)    dizzy  . Amlodipine Rash  . Ciprofloxacin Rash  .  Lisinopril Rash  . Penicillins Rash    Family states was a "long time ago"  . Saxagliptin Rash  . Sulfamethoxazole-Trimethoprim Rash     PERTINENT MEDICATIONS:  Outpatient Encounter Medications as of 08/15/2020  Medication Sig  . acetaminophen (TYLENOL) 500 MG tablet Take 500-1,000 mg by mouth every 6 (six) hours as needed for mild pain or moderate pain.  Marland Kitchen amiodarone (PACERONE) 200 MG tablet Take 1 tablet (200 mg total) by mouth 2 (two) times daily.  Marland Kitchen atorvastatin (LIPITOR) 80 MG tablet Take 1 tablet (80 mg total) by mouth daily.  . budesonide-formoterol (SYMBICORT) 160-4.5 MCG/ACT inhaler Inhale 2 puffs into the lungs 2 (two) times daily.  . chlorpheniramine-HYDROcodone (TUSSIONEX PENNKINETIC ER) 10-8 MG/5ML SUER Take 5 cc at bedtime at bed time  . Cholecalciferol (VITAMIN D3) 5000 units TABS Take 5,000 Units by mouth daily.  . clopidogrel (PLAVIX) 75 MG tablet Take 1 tablet (75 mg total) by mouth daily with breakfast.  . diphenoxylate-atropine (LOMOTIL) 2.5-0.025 MG tablet Take 1 tablet by mouth 2 (  two) times daily as needed for diarrhea or loose stools.  Marland Kitchen esomeprazole (NEXIUM) 20 MG capsule Take 20 mg by mouth 2 (two) times daily.  Marland Kitchen gabapentin (NEURONTIN) 100 MG capsule Take 100 mg by mouth at bedtime.   Marland Kitchen HUMULIN 70/30 KWIKPEN (70-30) 100 UNIT/ML KwikPen Inject 35 Units into the skin 2 (two) times daily. (Patient taking differently: Inject 32 Units into the skin 2 (two) times daily.)  . ipratropium-albuterol (DUONEB) 0.5-2.5 (3) MG/3ML SOLN Take 3 mLs by nebulization every 6 (six) hours as needed (SOB).  . isosorbide mononitrate (IMDUR) 30 MG 24 hr tablet Take 30 mg by mouth daily.  Marland Kitchen levothyroxine (SYNTHROID) 50 MCG tablet Take 50 mcg by mouth daily before breakfast.  . metoprolol succinate (TOPROL-XL) 100 MG 24 hr tablet Take 1 tablet (100 mg total) by mouth daily. Take with or immediately following a meal.  . nitroGLYCERIN (NITROSTAT) 0.4 MG SL tablet Place 0.4 mg under the tongue  every 5 (five) minutes as needed for chest pain.   . trospium (SANCTURA) 20 MG tablet Take 1 tablet (20 mg total) by mouth daily.  . VENTOLIN HFA 108 (90 Base) MCG/ACT inhaler Inhale 2 puffs into the lungs every 6 (six) hours as needed for wheezing or shortness of breath.   . vitamin B-12 (CYANOCOBALAMIN) 1000 MCG tablet Take 1,000 mcg by mouth daily.  Marland Kitchen ALPRAZolam (XANAX) 0.25 MG tablet TAKE ONE TAB PO QHS FOR SLEEP (Patient not taking: Reported on 08/15/2020)  . hydrOXYzine (ATARAX/VISTARIL) 25 MG tablet Take 25 mg by mouth 4 (four) times daily as needed for itching. (Patient not taking: Reported on 08/15/2020)  . irbesartan (AVAPRO) 150 MG tablet Take 1 tablet (150 mg total) by mouth daily. (Patient not taking: Reported on 08/15/2020)  . traZODone (DESYREL) 50 MG tablet Take 50 mg by mouth at bedtime as needed for sleep.  (Patient not taking: Reported on 08/15/2020)   No facility-administered encounter medications on file as of 08/15/2020.    Objective: ROS   General: NAD ENMT: denies dysphagia Cardiovascular: denies chest pain Pulmonary: endorses  Cough and  increased SOB, doe Abdomen: endorses good appetite, endorses constipation, endorses continence of bowel GU: denies dysuria, endorses continence of urine, nocturia + MSK:  endorses ROM limitations, no falls reported, uses cane Skin: denies rashes or wounds Neurological: endorses weakness, denies pain, endorses occ  insomnia Psych: Endorses positive mood Heme/lymph/immuno: denies bruises, abnormal bleeding  Physical Exam: Current and past weights: 170 lbs Constitutional: HR 83 RR 24 PO2 93%, NAD General: frail appearing, obese  EYES: anicteric sclera, lids intact, no discharge  ENMT: intact hearing,oral mucous membranes moist, dentition intact CV: S1S2 S3, , RRR, no LE edema Pulmonary: Moving air well, fine rales in bases, no wheezes,  Slight  increased work of breathing at rest, no cough, no audible wheezes, room air, uses oxygen  prn Abdomen: intake 100% no ascites MSK: mild sarcopenia, decreased ROM in all extremities, no contractures of LE, ambulatory with cane Skin: warm and dry, no rashes or wounds on visible skin Neuro: Generalized weakness, no cognitive impairment Psych: non-anxious affect, A and O x 3 Hem/lymph/immuno: no widespread bruising   Thank you for the opportunity to participate in the care of Ms. Beier.  The palliative care team will continue to follow. Please call our office at [phone] if we can be of additional assistance.  Jason Coop, NP , DNP, MPH, AGPCNP-BC, ACHPN   COVID-19 PATIENT SCREENING TOOL  Person answering questions: _______kathy____________   1.  Is the patient or any family member in the home showing any signs or symptoms regarding respiratory infection?                  Person with Symptom  ______________na___________ a. Fever/chills/headache                                                        Yes___ No__X_            b. Shortness of breath                                                            Yes___ No__X_           c. Cough/congestion                                               Yes___  No__X_          d. Muscle/Body aches/pains                                                   Yes___ No__X_         e. Gastrointestinal symptoms (diarrhea,nausea)             Yes___ No__X_         f. Sudden loss of smell or taste      Yes___ No__X_        2. Within the past 10 days, has anyone living in the home had any contact with someone with or under investigation for COVID-19?    Yes___ No__X__   Person __________________

## 2020-08-16 ENCOUNTER — Other Ambulatory Visit: Payer: Self-pay

## 2020-08-16 ENCOUNTER — Ambulatory Visit: Payer: Medicare Other | Admitting: Internal Medicine

## 2020-08-16 ENCOUNTER — Encounter: Payer: Self-pay | Admitting: Internal Medicine

## 2020-08-16 VITALS — BP 138/44 | HR 66 | Temp 97.9°F | Resp 16 | Ht 63.0 in | Wt 168.8 lb

## 2020-08-16 DIAGNOSIS — R059 Cough, unspecified: Secondary | ICD-10-CM

## 2020-08-16 DIAGNOSIS — J438 Other emphysema: Secondary | ICD-10-CM | POA: Diagnosis not present

## 2020-08-16 DIAGNOSIS — G4701 Insomnia due to medical condition: Secondary | ICD-10-CM

## 2020-08-16 MED ORDER — TRAZODONE HCL 50 MG PO TABS: ORAL_TABLET | ORAL | 1 refills | Status: DC

## 2020-08-16 NOTE — Progress Notes (Signed)
[facility] [address] [place], [place] [address]  Internal MEDICINE  Office Visit Note  Patient Name: Peggy [name]  [id number]  [id number]  Date of Service: 08/16/2020  Chief Complaint  Patient presents with   Cough    Going on for over a month, with some clear mucus     Cough This is a recurrent problem. The current episode started more than 1 month ago. Pertinent negatives include no chest pain, fever or postnasal drip.   Pt is here for a sick visit. Cough is worse at night. Uses inhaler in morning and at night, 2 puffs each time. Sputum is clear, denies shortness of breath. She reports she starts coughing at night and takes cough medicine which helps. Her most recent chest Xray was from November 2021 which showed mild cardiomegaly with pulmonary venous hypertension. Pt was hospitalized in late January 2022 for a STEMI, now s/p PCI. LVEF 35-40% per her most recent echo on 07/02/20. Patient is being followed by Dr. Gwen Pounds for cardiology. No recent CT of chest.      Current Medication:  Outpatient Encounter Medications as of 08/16/2020  Medication Sig   acetaminophen (TYLENOL) 500 MG tablet Take 500-1,000 mg by mouth every 6 (six) hours as needed for mild pain or moderate pain.   ALPRAZolam (XANAX) 0.25 MG tablet TAKE ONE TAB PO QHS FOR SLEEP   amiodarone (PACERONE) 200 MG tablet Take 1 tablet (200 mg total) by mouth 2 (two) times daily.   atorvastatin (LIPITOR) 80 MG tablet Take 1 tablet (80 mg total) by mouth daily.   budesonide-formoterol (SYMBICORT) 160-4.5 MCG/ACT inhaler Inhale 2 puffs into the lungs 2 (two) times daily.   chlorpheniramine-HYDROcodone (TUSSIONEX PENNKINETIC ER) 10-8 MG/5ML SUER Take 5 cc at bedtime at bed time   Cholecalciferol (VITAMIN D3) 5000 units TABS Take 5,000 Units by mouth daily.   clopidogrel (PLAVIX) 75 MG tablet Take 1 tablet (75 mg total) by mouth daily with breakfast.   diphenoxylate-atropine (LOMOTIL) 2.5-0.025 MG  tablet Take 1 tablet by mouth 2 (two) times daily as needed for diarrhea or loose stools.   esomeprazole (NEXIUM) 20 MG capsule Take 20 mg by mouth 2 (two) times daily.   gabapentin (NEURONTIN) 100 MG capsule Take 100 mg by mouth at bedtime.    HUMULIN 70/30 KWIKPEN (70-30) 100 UNIT/ML KwikPen Inject 35 Units into the skin 2 (two) times daily. (Patient taking differently: Inject 32 Units into the skin 2 (two) times daily.)   hydrOXYzine (ATARAX/VISTARIL) 25 MG tablet Take 25 mg by mouth 4 (four) times daily as needed for itching.   ipratropium-albuterol (DUONEB) 0.5-2.5 (3) MG/3ML SOLN Take 3 mLs by nebulization every 6 (six) hours as needed (SOB).   irbesartan (AVAPRO) 150 MG tablet Take 1 tablet (150 mg total) by mouth daily.   isosorbide mononitrate (IMDUR) 30 MG 24 hr tablet Take 30 mg by mouth daily.   levothyroxine (SYNTHROID) 50 MCG tablet Take 50 mcg by mouth daily before breakfast.   metoprolol succinate (TOPROL-XL) 100 MG 24 hr tablet Take 1 tablet (100 mg total) by mouth daily. Take with or immediately following a meal.   nitroGLYCERIN (NITROSTAT) 0.4 MG SL tablet Place 0.4 mg under the tongue every 5 (five) minutes as needed for chest pain.    traZODone (DESYREL) 50 MG tablet Take 50 mg by mouth at bedtime as needed for sleep.   trospium (SANCTURA) 20 MG tablet Take 1 tablet (20 mg total) by mouth daily.   VENTOLIN HFA  108 (90 Base) MCG/ACT inhaler Inhale 2 puffs into the lungs every 6 (six) hours as needed for wheezing or shortness of breath.    vitamin B-12 (CYANOCOBALAMIN) 1000 MCG tablet Take 1,000 mcg by mouth daily.   No facility-administered encounter medications on file as of 08/16/2020.      Medical History: Past Medical History:  Diagnosis Date   A-fib [facility])    Allergy    Arthritis    COPD (chronic obstructive pulmonary disease) (HCC)    Diabetes mellitus without complication (HCC)    GERD (gastroesophageal reflux disease)    Hyperlipidemia     Hypertension    Myocardial infarction (HCC)    Oxygen dependent    2 liters   Stroke (HCC)    5 years ago per daughter   TIA (transient ischemic attack)      Vital Signs: BP (!) 138/44    Pulse 66    Temp 97.9 F (36.6 C)    Resp 16    Ht 5\' 3"  (1.6 m)    Wt 168 lb 12.8 oz (76.6 kg)    SpO2 99%    BMI 29.90 kg/m    Review of Systems  Constitutional: Negative for fatigue and fever.  HENT: Negative for congestion, mouth sores and postnasal drip.   Respiratory: Positive for cough.   Cardiovascular: Negative for chest pain.  Genitourinary: Negative for flank pain.  Psychiatric/Behavioral: Positive for sleep disturbance.    Physical Exam Constitutional:      Appearance: Normal appearance.  HENT:     Head: Normocephalic and atraumatic.  Eyes:     Extraocular Movements: Extraocular movements intact.     Pupils: Pupils are equal, round, and reactive to light.  Cardiovascular:     Rate and Rhythm: Normal rate.     Heart sounds: Murmur heard.    Pulmonary:     Effort: Pulmonary effort is normal.     Breath sounds: Rales present.     Comments: Adventitious breath sounds Musculoskeletal:     Comments: Trace bilateral lower extremity edema  Skin:    General: Skin is warm and dry.  Neurological:     Mental Status: She is alert.  Psychiatric:        Mood and Affect: Mood normal.       Assessment/Plan: 1. Cough Multifactorial, recent history of STEMI, low EF, want to make sure patient is not in mild pulmonary edema. Addressed possibility of medication association with ARB, patient is on Avapro. Will order further diagnostics. - CT Chest Wo Contrast; Future  2. Other emphysema (HCC) Patient is on maximum therapy with duoneb using symbicort. is improved slightly from previous visit, patient does not seem to be in acute COPD exacerbation. - Spirometry with Graph  3. Insomnia due to medical condition Patient has significant sleeping disorder, encouraged to take  her trazodone every night. If no improvement seen, may want to try low dose Elavil. - traZODone (DESYREL) 50 MG tablet; Take 1 tablet every night for sleep  Dispense: 90 tablet; Refill: 1   General Counseling: Austina verbalizes understanding of the findings of todays visit and agrees with plan of treatment. I have discussed any further diagnostic evaluation that may be needed or ordered today. We also reviewed her medications today. she has been encouraged to call the office with any questions or concerns that should arise related to todays visit.    Counseling:    Orders Placed This Encounter  Procedures   Spirometry with Graph  No orders of the defined types were placed in this encounter.   [place] Controlled Substance Database was reviewed by me.  Time spent 30 Minutes

## 2020-08-17 ENCOUNTER — Telehealth: Payer: Self-pay

## 2020-08-17 NOTE — Telephone Encounter (Signed)
lMOM advising patient of CT scheduled at [facility] on 08-31-20 at 430. Toni Amend

## 2020-08-20 ENCOUNTER — Other Ambulatory Visit: Payer: Self-pay

## 2020-08-20 DIAGNOSIS — J45901 Unspecified asthma with (acute) exacerbation: Secondary | ICD-10-CM

## 2020-08-20 DIAGNOSIS — J441 Chronic obstructive pulmonary disease with (acute) exacerbation: Secondary | ICD-10-CM

## 2020-08-31 ENCOUNTER — Ambulatory Visit: Payer: Medicare Other

## 2020-09-06 ENCOUNTER — Ambulatory Visit: Payer: Medicare Other | Admitting: Internal Medicine

## 2020-09-11 ENCOUNTER — Ambulatory Visit
Admission: RE | Admit: 2020-09-11 | Discharge: 2020-09-11 | Disposition: A | Discharge status: Home / Self Care | Payer: Medicare Other | Source: Ambulatory Visit | Attending: Internal Medicine | Admitting: Internal Medicine

## 2020-09-11 ENCOUNTER — Other Ambulatory Visit: Payer: Self-pay

## 2020-09-11 ENCOUNTER — Ambulatory Visit: Payer: Medicare Other

## 2020-09-11 DIAGNOSIS — I517 Cardiomegaly: Secondary | ICD-10-CM | POA: Insufficient documentation

## 2020-09-11 DIAGNOSIS — J439 Emphysema, unspecified: Secondary | ICD-10-CM | POA: Diagnosis not present

## 2020-09-11 DIAGNOSIS — I700 Atherosclerosis of aorta: Secondary | ICD-10-CM | POA: Diagnosis not present

## 2020-09-11 DIAGNOSIS — R059 Cough, unspecified: Secondary | ICD-10-CM | POA: Diagnosis not present

## 2020-09-11 DIAGNOSIS — I2510 Atherosclerotic heart disease of native coronary artery without angina pectoris: Secondary | ICD-10-CM | POA: Insufficient documentation

## 2020-09-12 ENCOUNTER — Telehealth: Payer: Self-pay

## 2020-09-12 DIAGNOSIS — J441 Chronic obstructive pulmonary disease with (acute) exacerbation: Secondary | ICD-10-CM

## 2020-09-12 DIAGNOSIS — J45901 Unspecified asthma with (acute) exacerbation: Secondary | ICD-10-CM

## 2020-09-12 MED ORDER — BUDESONIDE-FORMOTEROL FUMARATE 160-4.5 MCG/ACT IN AERO: 2.0000 | INHALATION_SPRAY | Freq: Two times a day (BID) | RESPIRATORY_TRACT | 3 refills | Status: DC

## 2020-09-12 NOTE — Telephone Encounter (Signed)
PA approved for BUDESONIDE-FORMOTEROL FUMARATE 160-4.5 mcg/act aerosol valis from 08/20/20 to 08/20/21

## 2020-09-17 ENCOUNTER — Other Ambulatory Visit: Payer: Self-pay | Admitting: Internal Medicine

## 2020-09-17 DIAGNOSIS — J45901 Unspecified asthma with (acute) exacerbation: Secondary | ICD-10-CM

## 2020-09-17 DIAGNOSIS — J441 Chronic obstructive pulmonary disease with (acute) exacerbation: Secondary | ICD-10-CM

## 2020-09-17 MED ORDER — HYDROCOD POLST-CPM POLST ER 10-8 MG/5ML PO SUER: ORAL | 0 refills | Status: DC

## 2020-09-18 ENCOUNTER — Telehealth: Payer: Self-pay

## 2020-09-18 ENCOUNTER — Encounter: Payer: Self-pay | Admitting: Internal Medicine

## 2020-09-18 ENCOUNTER — Ambulatory Visit: Payer: Medicare Other | Admitting: Internal Medicine

## 2020-09-18 ENCOUNTER — Other Ambulatory Visit: Payer: Self-pay

## 2020-09-18 ENCOUNTER — Other Ambulatory Visit: Payer: Self-pay | Admitting: Internal Medicine

## 2020-09-18 VITALS — BP 112/48 | HR 62 | Temp 98.3°F | Resp 16 | Ht 62.0 in | Wt 172.4 lb

## 2020-09-18 DIAGNOSIS — J441 Chronic obstructive pulmonary disease with (acute) exacerbation: Secondary | ICD-10-CM

## 2020-09-18 DIAGNOSIS — J9611 Chronic respiratory failure with hypoxia: Secondary | ICD-10-CM | POA: Diagnosis not present

## 2020-09-18 DIAGNOSIS — Z9981 Dependence on supplemental oxygen: Secondary | ICD-10-CM | POA: Diagnosis not present

## 2020-09-18 DIAGNOSIS — J449 Chronic obstructive pulmonary disease, unspecified: Secondary | ICD-10-CM | POA: Diagnosis not present

## 2020-09-18 MED ORDER — PREDNISONE 10 MG PO TABS: 10.0000 mg | ORAL_TABLET | Freq: Every day | ORAL | 0 refills | Status: DC

## 2020-09-18 MED ORDER — PREDNISONE 10 MG PO TABS: ORAL_TABLET | ORAL | 0 refills | Status: DC

## 2020-09-18 MED ORDER — AZITHROMYCIN 250 MG PO TABS: ORAL_TABLET | ORAL | 0 refills | Status: DC

## 2020-09-18 MED ORDER — HYDROCOD POLST-CPM POLST ER 10-8 MG/5ML PO SUER: ORAL | 0 refills | Status: DC

## 2020-09-18 NOTE — Telephone Encounter (Signed)
NP called [facility] in [place] and cancelled rx for Tussinex due to being out of stock, daughter asked if prescription can be sent to [facility] [address]

## 2020-09-18 NOTE — Progress Notes (Signed)
[facility] [address] [place], [place] [address]  Pulmonary Sleep Medicine   Office Visit Note  Patient Name: Peggy [name] DOB: 24-May-1929 MRN [id number]  Date of Service: 09/18/2020  Complaints/HPI: Cough she states that she had gone to the beach with some family apparently one of her daughters were sick over there and she is having his cough and has been producing some sputum.  She denies having any hemoptysis.  She does not have any fevers.  She does have some wheezing noted.  She was seen about a week ago had a CT scan done at that time CT scan did not show any evidence of pneumonia.  She had some nodules which are basically unchanged  ROS  General: (-) fever, (-) chills, (-) night sweats, (-) weakness Skin: (-) rashes, (-) itching,. Eyes: (-) visual changes, (-) redness, (-) itching. Nose and Sinuses: (-) nasal stuffiness or itchiness, (-) postnasal drip, (-) nosebleeds, (-) sinus trouble. Mouth and Throat: (-) sore throat, (-) hoarseness. Neck: (-) swollen glands, (-) enlarged thyroid, (-) neck pain. Respiratory: + cough, (-) bloody sputum, + shortness of breath, + wheezing. Cardiovascular: - ankle swelling, (-) chest pain. Lymphatic: (-) lymph node enlargement. Neurologic: (-) numbness, (-) tingling. Psychiatric: (-) anxiety, (-) depression   Current Medication: Outpatient Encounter Medications as of 09/18/2020  Medication Sig  . acetaminophen (TYLENOL) 500 MG tablet Take 500-1,000 mg by mouth every 6 (six) hours as needed for mild pain or moderate pain.  Marland Kitchen ALPRAZolam (XANAX) 0.25 MG tablet TAKE ONE TAB PO QHS FOR SLEEP  . amiodarone (PACERONE) 200 MG tablet Take 1 tablet (200 mg total) by mouth 2 (two) times daily.  Marland Kitchen atorvastatin (LIPITOR) 80 MG tablet Take 1 tablet (80 mg total) by mouth daily.  . budesonide-formoterol (SYMBICORT) 160-4.5 MCG/ACT inhaler Inhale 2 puffs into the lungs 2 (two) times daily.  . chlorpheniramine-HYDROcodone (TUSSIONEX  PENNKINETIC ER) 10-8 MG/5ML SUER Take 5 cc at bedtime at bed time  . Cholecalciferol (VITAMIN D3) 5000 units TABS Take 5,000 Units by mouth daily.  . clopidogrel (PLAVIX) 75 MG tablet Take 1 tablet (75 mg total) by mouth daily with breakfast.  . diphenoxylate-atropine (LOMOTIL) 2.5-0.025 MG tablet Take 1 tablet by mouth 2 (two) times daily as needed for diarrhea or loose stools.  Marland Kitchen esomeprazole (NEXIUM) 20 MG capsule Take 20 mg by mouth 2 (two) times daily.  Marland Kitchen gabapentin (NEURONTIN) 100 MG capsule Take 100 mg by mouth at bedtime.   Marland Kitchen HUMULIN 70/30 KWIKPEN (70-30) 100 UNIT/ML KwikPen Inject 35 Units into the skin 2 (two) times daily. (Patient taking differently: Inject 32 Units into the skin 2 (two) times daily.)  . hydrOXYzine (ATARAX/VISTARIL) 25 MG tablet Take 25 mg by mouth 4 (four) times daily as needed for itching.  Marland Kitchen ipratropium-albuterol (DUONEB) 0.5-2.5 (3) MG/3ML SOLN Take 3 mLs by nebulization every 6 (six) hours as needed (SOB).  . irbesartan (AVAPRO) 150 MG tablet Take 1 tablet (150 mg total) by mouth daily.  . isosorbide mononitrate (IMDUR) 30 MG 24 hr tablet Take 30 mg by mouth daily.  Marland Kitchen levothyroxine (SYNTHROID) 50 MCG tablet Take 50 mcg by mouth daily before breakfast.  . metoprolol succinate (TOPROL-XL) 100 MG 24 hr tablet Take 1 tablet (100 mg total) by mouth daily. Take with or immediately following a meal.  . nitroGLYCERIN (NITROSTAT) 0.4 MG SL tablet Place 0.4 mg under the tongue every 5 (five) minutes as needed for chest pain.   . traZODone (DESYREL) 50 MG  tablet Take 1 tablet every night for sleep  . trospium (SANCTURA) 20 MG tablet Take 1 tablet (20 mg total) by mouth daily.  . VENTOLIN HFA 108 (90 Base) MCG/ACT inhaler Inhale 2 puffs into the lungs every 6 (six) hours as needed for wheezing or shortness of breath.   . vitamin B-12 (CYANOCOBALAMIN) 1000 MCG tablet Take 1,000 mcg by mouth daily.  . [DISCONTINUED] chlorpheniramine-HYDROcodone (TUSSIONEX PENNKINETIC ER) 10-8  MG/5ML SUER Take 5 cc at bedtime at bed time   No facility-administered encounter medications on file as of 09/18/2020.    Surgical History: Past Surgical History:  Procedure Laterality Date  . ABDOMINAL HYSTERECTOMY  1970  . BACK SURGERY  2013  . colectomy Right 10/08/2013   Dr. Egbert GaribaldiBird  . CORONARY ANGIOPLASTY WITH STENT PLACEMENT    . CORONARY/GRAFT ACUTE MI REVASCULARIZATION N/A 07/02/2020   Procedure: Coronary/Graft Acute MI Revascularization;  Surgeon: Iran OuchArida, Muhammad A, MD;  Location: [facility] INVASIVE CV LAB;  Service: Cardiovascular;  Laterality: N/A;  . LEFT HEART CATH AND CORONARY ANGIOGRAPHY N/A 07/02/2020   Procedure: LEFT HEART CATH AND CORONARY ANGIOGRAPHY;  Surgeon: Iran OuchArida, Muhammad A, MD;  Location: [facility] INVASIVE CV LAB;  Service: Cardiovascular;  Laterality: N/A;    Medical History: Past Medical History:  Diagnosis Date  . A-fib (HCC)   . Allergy   . Arthritis   . COPD (chronic obstructive pulmonary disease) (HCC)   . Diabetes mellitus without complication (HCC)   . GERD (gastroesophageal reflux disease)   . Hyperlipidemia   . Hypertension   . Myocardial infarction (HCC)   . Oxygen dependent    2 liters  . Stroke [facility](HCC)    5 years ago per daughter  . TIA (transient ischemic attack)     Family History: Family History  Problem Relation Age of Onset  . Breast cancer Mother   . Pancreatitis Father   . Breast cancer Sister   . Lung cancer Brother   . Melanoma Brother   . Throat cancer Brother     Social History: Social History   Socioeconomic History  . Marital status: Widowed    Spouse name: Not on file  . Number of children: 3  . Years of education: H/S  . Highest education level: Not on file  Occupational History  . Occupation: Retired  Tobacco Use  . Smoking status: Former Smoker    Packs/day: 1.00    Years: 30.00    Pack years: 30.00    Quit date: 11/30/1999    Years since quitting: 20.8  . Smokeless tobacco: Never Used  Vaping Use  . Vaping  Use: Never used  Substance and Sexual Activity  . Alcohol use: No  . Drug use: No  . Sexual activity: Not on file  Other Topics Concern  . Not on file  Social History Narrative  . Not on file   Social Determinants of Health   Financial Resource Strain: Not on file  Food Insecurity: Not on file  Transportation Needs: Not on file  Physical Activity: Not on file  Stress: Not on file  Social Connections: Not on file  Intimate Partner Violence: Not on file    Vital Signs: Blood pressure (!) 112/48, pulse 62, temperature 98.3 F (36.8 C), resp. rate 16, height 5\' 2"  (1.575 m), weight 172 lb 6.4 oz (78.2 kg), SpO2 99 %.  Examination: General Appearance: The patient is well-developed, well-nourished, and in no distress. Skin: Gross inspection of skin unremarkable. Head: normocephalic, no gross deformities. Eyes: no  gross deformities noted. ENT: ears appear grossly normal no exudates. Neck: Supple. No thyromegaly. No LAD. Respiratory: few rhonchi noted bilaterally. Cardiovascular: Normal S1 and S2 without murmur or rub. Extremities: No cyanosis. pulses are equal. Neurologic: Alert and oriented. No involuntary movements.  LABS: Recent Results (from the past 2160 hour(s))  CBC with Differential     Status: Abnormal   Collection Time: 07/02/20  2:16 PM  Result Value Ref Range   WBC 10.5 4.0 - 10.5 K/uL   RBC 4.95 3.87 - 5.11 MIL/uL   Hemoglobin 11.7 (L) 12.0 - 15.0 g/dL   HCT 16.1 09.6 - 04.5 %   MCV 79.8 (L) 80.0 - 100.0 fL   MCH 23.6 (L) 26.0 - 34.0 pg   MCHC 29.6 (L) 30.0 - 36.0 g/dL   RDW 40.9 81.1 - 91.4 %   Platelets 352 150 - 400 K/uL   nRBC 0.0 0.0 - 0.2 %   Neutrophils Relative % 66 %   Neutro Abs 7.0 1.7 - 7.7 K/uL   Lymphocytes Relative 16 %   Lymphs Abs 1.7 0.7 - 4.0 K/uL   Monocytes Relative 9 %   Monocytes Absolute 0.9 0.1 - 1.0 K/uL   Eosinophils Relative 7 %   Eosinophils Absolute 0.7 (H) 0.0 - 0.5 K/uL   Basophils Relative 1 %   Basophils Absolute 0.1  0.0 - 0.1 K/uL   Immature Granulocytes 1 %   Abs Immature Granulocytes 0.08 (H) 0.00 - 0.07 K/uL    Comment: Performed at [facility], [address] [place], [place] [address]  Basic metabolic panel     Status: Abnormal   Collection Time: 07/02/20  2:16 PM  Result Value Ref Range   Sodium 139 135 - 145 mmol/L   Potassium 4.0 3.5 - 5.1 mmol/L   Chloride 102 98 - 111 mmol/L   CO2 26 22 - 32 mmol/L   Glucose, Bld 101 (H) 70 - 99 mg/dL    Comment: Glucose reference range applies only to samples taken after fasting for at least 8 hours.   BUN 14 8 - 23 mg/dL   Creatinine, Ser 6.21 0.44 - 1.00 mg/dL   Calcium 9.4 8.9 - 30.8 mg/dL   GFR, Estimated 57 (L) >60 mL/min    Comment: (NOTE) Calculated using the CKD-EPI Creatinine Equation (2021)    Anion gap 11 5 - 15    Comment: Performed at [facility], [address] [place], [place] [address]  Troponin I (High Sensitivity)     Status: Abnormal   Collection Time: 07/02/20  2:16 PM  Result Value Ref Range   Troponin I (High Sensitivity) 21 (H) <18 ng/L    Comment: (NOTE) Elevated high sensitivity troponin I (hsTnI) values and significant  changes across serial measurements may suggest ACS but many other  chronic and acute conditions are known to elevate hsTnI results.  Refer to the "Links" section for chest pain algorithms and additional  guidance. Performed at [facility], [address] [place], [place] [address]   SARS Coronavirus 2 by RT PCR (hospital order, performed in [facility] hospital lab) Nasopharyngeal Nasopharyngeal Swab     Status: None   Collection Time: 07/02/20  2:18 PM   Specimen: Nasopharyngeal Swab  Result Value Ref Range   SARS Coronavirus 2 NEGATIVE NEGATIVE    Comment: (NOTE) SARS-CoV-2 target nucleic acids are NOT DETECTED.  The SARS-CoV-2 RNA is generally detectable in upper and lower respiratory specimens during the acute phase of  infection. The lowest concentration of  SARS-CoV-2 viral copies this assay can detect is 250 copies / mL. A negative result does not preclude SARS-CoV-2 infection and should not be used as the sole basis for treatment or other patient management decisions.  A negative result may occur with improper specimen collection / handling, submission of specimen other than nasopharyngeal swab, presence of viral mutation(s) within the areas targeted by this assay, and inadequate number of viral copies (<250 copies / mL). A negative result must be combined with clinical observations, patient history, and epidemiological information.  Fact Sheet for Patients:   BoilerBrush.com.cy  Fact Sheet for Healthcare Providers: https://pope.com/  This test is not yet approved or  cleared by the [place] FDA and has been authorized for detection and/or diagnosis of SARS-CoV-2 by FDA under an Emergency Use Authorization (EUA).  This EUA will remain in effect (meaning this test can be used) for the duration of the COVID-19 declaration under Section 564(b)(1) of the Act, 21 U.S.C. section 360bbb-3(b)(1), unless the authorization is terminated or revoked sooner.  Performed at [facility], [address] [place], [place] [address]   POCT Activated clotting time     Status: None   Collection Time: 07/02/20  3:13 PM  Result Value Ref Range   Activated Clotting Time 255 seconds  MRSA PCR Screening     Status: None   Collection Time: 07/02/20  4:02 PM   Specimen: Nasopharyngeal  Result Value Ref Range   MRSA by PCR NEGATIVE NEGATIVE    Comment:        The GeneXpert MRSA Assay (FDA approved for NASAL specimens only), is one component of a comprehensive MRSA colonization surveillance program. It is not intended to diagnose MRSA infection nor to guide or monitor treatment for MRSA infections. Performed at [facility], [address] [place], [place] [address]   Glucose,  capillary     Status: None   Collection Time: 07/02/20  4:04 PM  Result Value Ref Range   Glucose-Capillary 99 70 - 99 mg/dL    Comment: Glucose reference range applies only to samples taken after fasting for at least 8 hours.  Troponin I (High Sensitivity)     Status: Abnormal   Collection Time: 07/02/20  4:15 PM  Result Value Ref Range   Troponin I (High Sensitivity) >27,000 (HH) <18 ng/L    Comment: RESULTS CONFIRMED BY MANUAL DILUTION CRITICAL RESULT CALLED TO, READ BACK BY AND VERIFIED WITH: Deatra Canter 1916 07/02/20 DB (NOTE) Elevated high sensitivity troponin I (hsTnI) values and significant  changes across serial measurements may suggest ACS but many other  chronic and acute conditions are known to elevate hsTnI results.  Refer to the Links section for chest pain algorithms and additional  guidance. Performed at [facility], [address] [place], [place] [address]   CBC     Status: Abnormal   Collection Time: 07/02/20  4:15 PM  Result Value Ref Range   WBC 13.0 (H) 4.0 - 10.5 K/uL   RBC 5.22 (H) 3.87 - 5.11 MIL/uL   Hemoglobin 12.2 12.0 - 15.0 g/dL   HCT 86.7 67.2 - 09.4 %   MCV 79.7 (L) 80.0 - 100.0 fL   MCH 23.4 (L) 26.0 - 34.0 pg   MCHC 29.3 (L) 30.0 - 36.0 g/dL   RDW 70.9 62.8 - 36.6 %   Platelets 341 150 - 400 K/uL   nRBC 0.0 0.0 - 0.2 %    Comment: Performed  at [facility], [address] [place], [place] [address]  Creatinine, serum     Status: Abnormal   Collection Time: 07/02/20  4:15 PM  Result Value Ref Range   Creatinine, Ser 1.05 (H) 0.44 - 1.00 mg/dL   GFR, Estimated 51 (L) >60 mL/min    Comment: (NOTE) Calculated using the CKD-EPI Creatinine Equation (2021) Performed at [facility], [address] [place], [place] [address]   ECHOCARDIOGRAM COMPLETE     Status: None   Collection Time: 07/02/20  7:45 PM  Result Value Ref Range   Weight 2,680.79 oz   Height 62 in   BP 192/90 mmHg   Ao pk vel 1.46 m/s   AR  max vel 1.00 cm2   AV Peak grad 8.5 mmHg   S' Lateral 3.67 cm   Area-P 1/2 4.36 cm2  Glucose, capillary     Status: Abnormal   Collection Time: 07/02/20  9:42 PM  Result Value Ref Range   Glucose-Capillary 136 (H) 70 - 99 mg/dL    Comment: Glucose reference range applies only to samples taken after fasting for at least 8 hours.  Basic metabolic panel     Status: Abnormal   Collection Time: 07/03/20  5:23 AM  Result Value Ref Range   Sodium 135 135 - 145 mmol/L   Potassium 4.5 3.5 - 5.1 mmol/L   Chloride 97 (L) 98 - 111 mmol/L   CO2 25 22 - 32 mmol/L   Glucose, Bld 166 (H) 70 - 99 mg/dL    Comment: Glucose reference range applies only to samples taken after fasting for at least 8 hours.   BUN 15 8 - 23 mg/dL   Creatinine, Ser 8.29 (H) 0.44 - 1.00 mg/dL   Calcium 8.7 (L) 8.9 - 10.3 mg/dL   GFR, Estimated 53 (L) >60 mL/min    Comment: (NOTE) Calculated using the CKD-EPI Creatinine Equation (2021)    Anion gap 13 5 - 15    Comment: Performed at [facility], [address] [place], [place] [address]  CBC     Status: Abnormal   Collection Time: 07/03/20  5:23 AM  Result Value Ref Range   WBC 14.3 (H) 4.0 - 10.5 K/uL   RBC 4.84 3.87 - 5.11 MIL/uL   Hemoglobin 11.4 (L) 12.0 - 15.0 g/dL   HCT 08.6 57.8 - 46.9 %   MCV 77.7 (L) 80.0 - 100.0 fL   MCH 23.6 (L) 26.0 - 34.0 pg   MCHC 30.3 30.0 - 36.0 g/dL   RDW 62.9 52.8 - 41.3 %   Platelets 338 150 - 400 K/uL   nRBC 0.0 0.0 - 0.2 %    Comment: Performed at [facility], [address] [place], [place] [address]  Glucose, capillary     Status: Abnormal   Collection Time: 07/03/20  9:11 AM  Result Value Ref Range   Glucose-Capillary 264 (H) 70 - 99 mg/dL    Comment: Glucose reference range applies only to samples taken after fasting for at least 8 hours.  Glucose, capillary     Status: Abnormal   Collection Time: 07/03/20 11:27 AM  Result Value Ref Range   Glucose-Capillary 192 (H) 70 - 99 mg/dL    Comment:  Glucose reference range applies only to samples taken after fasting for at least 8 hours.  Glucose, capillary     Status: Abnormal   Collection Time: 07/03/20  4:06 PM  Result Value Ref Range   Glucose-Capillary  183 (H) 70 - 99 mg/dL    Comment: Glucose reference range applies only to samples taken after fasting for at least 8 hours.  Glucose, capillary     Status: Abnormal   Collection Time: 07/04/20  9:12 AM  Result Value Ref Range   Glucose-Capillary 263 (H) 70 - 99 mg/dL    Comment: Glucose reference range applies only to samples taken after fasting for at least 8 hours.  Glucose, capillary     Status: Abnormal   Collection Time: 07/04/20 11:24 AM  Result Value Ref Range   Glucose-Capillary 311 (H) 70 - 99 mg/dL    Comment: Glucose reference range applies only to samples taken after fasting for at least 8 hours.    Radiology: CT Chest Wo Contrast  Result Date: 09/12/2020 CLINICAL DATA:  87 year old with persistent cough for 2 months, worse at night. EXAM: CT CHEST WITHOUT CONTRAST TECHNIQUE: Multidetector CT imaging of the chest was performed following the standard protocol without IV contrast. COMPARISON:  Most recent radiograph 04/14/2020. Most recent chest CT 11/22/2018, additional prior chest CT reviewed. FINDINGS: Cardiovascular: Moderate atherosclerosis of the thoracic aorta and branch vessels. Mild multi chamber cardiomegaly. Coronary artery calcifications versus stents. No significant pericardial effusion. Mediastinum/Nodes: Scattered small mediastinal lymph nodes are not enlarged by size criteria. Limited assessment for hilar adenopathy on this noncontrast exam. No bulky and there adenopathy. Small hiatal hernia again seen. Lungs/Pleura: Mild apical predominant emphysema. Well-circumscribed 8 mm nodule in the perifissural left lower lobe, series 3, image 59, stable since 2015, and considered benign. Multiple additional small pulmonary nodules, some of which are calcified, are also  stable. There is biapical pleuroparenchymal scarring. No consolidation or acute airspace disease. No findings of pulmonary edema. No pleural fluid. The trachea and central bronchi are patent. Upper Abdomen: Cyst in the left lobe of the liver. No acute upper abdominal findings. Musculoskeletal: There are no acute or suspicious osseous abnormalities. No chest wall soft tissue abnormalities. IMPRESSION: 1. No acute findings. 2. Mild emphysema.  No other explanation for cough. 3. Scattered pulmonary nodules, largest measuring 8 mm, stable over multiple prior exams and considered benign. 4. Mild cardiomegaly. Coronary artery calcifications versus stents. Aortic Atherosclerosis (ICD10-I70.0) and Emphysema (ICD10-J43.9). Electronically Signed   By: Narda Rutherford M.D.   On: 09/12/2020 23:17    No results found.  CT Chest Wo Contrast  Result Date: 09/12/2020 CLINICAL DATA:  87 year old with persistent cough for 2 months, worse at night. EXAM: CT CHEST WITHOUT CONTRAST TECHNIQUE: Multidetector CT imaging of the chest was performed following the standard protocol without IV contrast. COMPARISON:  Most recent radiograph 04/14/2020. Most recent chest CT 11/22/2018, additional prior chest CT reviewed. FINDINGS: Cardiovascular: Moderate atherosclerosis of the thoracic aorta and branch vessels. Mild multi chamber cardiomegaly. Coronary artery calcifications versus stents. No significant pericardial effusion. Mediastinum/Nodes: Scattered small mediastinal lymph nodes are not enlarged by size criteria. Limited assessment for hilar adenopathy on this noncontrast exam. No bulky and there adenopathy. Small hiatal hernia again seen. Lungs/Pleura: Mild apical predominant emphysema. Well-circumscribed 8 mm nodule in the perifissural left lower lobe, series 3, image 59, stable since 2015, and considered benign. Multiple additional small pulmonary nodules, some of which are calcified, are also stable. There is biapical  pleuroparenchymal scarring. No consolidation or acute airspace disease. No findings of pulmonary edema. No pleural fluid. The trachea and central bronchi are patent. Upper Abdomen: Cyst in the left lobe of the liver. No acute upper abdominal findings. Musculoskeletal: There are no acute or  suspicious osseous abnormalities. No chest wall soft tissue abnormalities. IMPRESSION: 1. No acute findings. 2. Mild emphysema.  No other explanation for cough. 3. Scattered pulmonary nodules, largest measuring 8 mm, stable over multiple prior exams and considered benign. 4. Mild cardiomegaly. Coronary artery calcifications versus stents. Aortic Atherosclerosis (ICD10-I70.0) and Emphysema (ICD10-J43.9). Electronically Signed   By: Narda Rutherford M.D.   On: 09/12/2020 23:17      Assessment and Plan: Patient Active Problem List   Diagnosis Date Noted  . ST elevation myocardial infarction (STEMI) of lateral wall (HCC) 07/02/2020  . Abnormality of gait 05/02/2020  . Atrial fibrillation with rapid ventricular response (HCC) 04/14/2020  . Melena 04/14/2020  . COPD with acute exacerbation (HCC) 03/30/2020  . Elevated troponin level 03/30/2020  . Leukocytosis 03/30/2020  . COPD (chronic obstructive pulmonary disease) (HCC) 03/22/2020  . Chest pain with high risk for cardiac etiology 11/09/2019  . Osteoarthritis of multiple joints 03/01/2019  . Uncontrolled hypertension 11/22/2018  . Primary osteoarthritis of both knees 09/07/2018  . Chronic respiratory failure with hypoxia (HCC) 10/08/2017  . Simple chronic bronchitis (HCC) 10/08/2017  . SOB (shortness of breath) 09/24/2017  . History of stroke 08/17/2017  . Acute respiratory failure with hypoxia and hypercarbia (HCC) 08/07/2017  . Respiratory failure (HCC) 08/07/2017  . History of GI bleed 07/07/2017  . Atrial fibrillation with RVR (HCC) 06/30/2017  . Bilateral carotid artery disease (HCC) 04/14/2017  . Healthcare maintenance 04/14/2017  . Dizziness  04/07/2017  . 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 TIA (transient ischemic attack) 12/10/2016  . CVA (cerebral vascular accident) (HCC) 12/09/2016  . Bilateral arm pain 11/27/2016  . Myocardial infarction (HCC) 07/30/2015  . Urinary frequency 02/09/2015  . RUQ abdominal pain 01/26/2015  . Shortness of breath 01/26/2015  . Anxiety 01/26/2015  . Chronic pruritus 12/21/2014  . Cerebral vascular accident (HCC) 11/16/2014  . Chronic low back pain 11/16/2014  . Adult hypothyroidism 11/15/2014  . Allergic rhinitis 11/15/2014  . Body mass index (BMI) of 28.0-28.9 in adult 11/15/2014  . Arthritis 11/15/2014  . Cramps of lower extremity 11/15/2014  . Essential (primary) hypertension 11/15/2014  . Acid reflux 11/15/2014  . Hypercholesteremia 11/15/2014  . Cannot sleep 11/15/2014  . Psoriasis 11/15/2014  . Itch of skin 11/15/2014  . Restless leg 11/15/2014  . Type 2 diabetes mellitus with other specified complication (HCC) 11/15/2014  . Breath shortness 11/15/2014  . Dermatitis due to unknown cause 04/10/2014  . CAD (coronary artery disease) 02/20/2014  . AF (paroxysmal atrial fibrillation) (HCC) 02/20/2014  . Temporary cerebral vascular dysfunction 02/20/2014  . DD (diverticular disease) 10/05/2013    1. Chronic respiratory failure with hypoxia (HCC) She is on oxygen therapy which will be continued.  We will continue with current settings on the O2 saturations are acceptable.  2. Oxygen dependent As above on oxygen therapy continue current flow rate  3. Chronic obstructive pulmonary disease, unspecified COPD type (HCC) Severe disease we will continue with supportive care CT scan done showing no pneumonia  4. Chronic obstructive pulmonary disease with acute exacerbation (HCC) Current situation she has an acute exacerbation her cough is wet she was not able to bring anything up but she has not noticed any hemoptysis.  Last CT scan was unremarkable for pneumonia  so therefore I think we can treat as outpatient - predniSONE (DELTASONE) 10 MG tablet; Take 1 tablet (10 mg total) by mouth daily with breakfast. 12 day prednisone taper  Dispense: 21 tablet; Refill:  0 - azithromycin (ZITHROMAX) 250 MG tablet; As directed zpk  Dispense: 6 tablet; Refill: 0   General Counseling: I have discussed the findings of the evaluation and examination with Joyce Gross.  I have also discussed any further diagnostic evaluation thatmay be needed or ordered today. Pippa verbalizes understanding of the findings of todays visit. We also reviewed her medications today and discussed drug interactions and side effects including but not limited excessive drowsiness and altered mental states. We also discussed that there is always a risk not just to her but also people around her. she has been encouraged to call the office with any questions or concerns that should arise related to todays visit.  No orders of the defined types were placed in this encounter.    Time spent: 31  I have personally obtained a history, examined the patient, evaluated laboratory and imaging results, formulated the assessment and plan and placed orders.    Yevonne Pax, MD [facility] Pulmonary and Critical Care Sleep medicine

## 2020-09-18 NOTE — Progress Notes (Signed)
CT-scan of the chest will be reviewed on next visit

## 2020-09-18 NOTE — Telephone Encounter (Signed)
Medication sent to pharmacy  

## 2020-09-18 NOTE — Telephone Encounter (Signed)
Called [facility] and canceled tussionex pres due to they don't have med

## 2020-09-18 NOTE — Patient Instructions (Signed)
Chronic Obstructive Pulmonary Disease  Chronic obstructive pulmonary disease (COPD) is a long-term (chronic) lung problem. When you have COPD, it is hard for air to get in and out of your lungs. Usually the condition gets worse over time, and your lungs will never return to normal. There are things you can do to keep yourself as healthy as possible. What are the causes?  Smoking. This is the most common cause.  Certain genes passed from parent to child (inherited). What increases the risk?  Being exposed to secondhand smoke from cigarettes, pipes, or cigars.  Being exposed to chemicals and other irritants, such as fumes and dust in the work environment.  Having chronic lung conditions or infections. What are the signs or symptoms?  Shortness of breath, especially during physical activity.  A long-term cough with a large amount of thick mucus. Sometimes, the cough may not have any mucus (dry cough).  Wheezing.  Breathing quickly.  Skin that looks gray or blue, especially in the fingers, toes, or lips.  Feeling tired (fatigue).  Weight loss.  Chest tightness.  Having infections often.  Episodes when breathing symptoms become much worse (exacerbations). At the later stages of this disease, you may have swelling in the ankles, feet, or legs. How is this treated?  Taking medicines.  Quitting smoking, if you smoke.  Rehabilitation. This includes steps to make your body work better. It may involve a team of specialists.  Doing exercises.  Making changes to your diet.  Using oxygen.  Lung surgery.  Lung transplant.  Comfort measures (palliative care). Follow these instructions at home: Medicines  Take over-the-counter and prescription medicines only as told by your doctor.  Talk to your doctor before taking any cough or allergy medicines. You may need to avoid medicines that cause your lungs to be dry. Lifestyle  If you smoke, stop smoking. Smoking makes the  problem worse.  Do not smoke or use any products that contain nicotine or tobacco. If you need help quitting, ask your doctor.  Avoid being around things that make your breathing worse. This may include smoke, chemicals, and fumes.  Stay active, but remember to rest as well.  Learn and use tips on how to manage stress and control your breathing.  Make sure you get enough sleep. Most adults need at least 7 hours of sleep every night.  Eat healthy foods. Eat smaller meals more often. Rest before meals. Controlled breathing Learn and use tips on how to control your breathing as told by your doctor. Try:  Breathing in (inhaling) through your nose for 1 second. Then, pucker your lips and breath out (exhale) through your lips for 2 seconds.  Putting one hand on your belly (abdomen). Breathe in slowly through your nose for 1 second. Your hand on your belly should move out. Pucker your lips and breathe out slowly through your lips. Your hand on your belly should move in as you breathe out.   Controlled coughing Learn and use controlled coughing to clear mucus from your lungs. Follow these steps: 1. Lean your head a little forward. 2. Breathe in deeply. 3. Try to hold your breath for 3 seconds. 4. Keep your mouth slightly open while coughing 2 times. 5. Spit any mucus out into a tissue. 6. Rest and do the steps again 1 or 2 times as needed. General instructions  Make sure you get all the shots (vaccines) that your doctor recommends. Ask your doctor about a flu shot and a pneumonia shot.    Use oxygen therapy and pulmonary rehabilitation if told by your doctor. If you need home oxygen therapy, ask your doctor if you should buy a tool to measure your oxygen level (oximeter).  Make a COPD action plan with your doctor. This helps you to know what to do if you feel worse than usual.  Manage any other conditions you have as told by your doctor.  Avoid going outside when it is very hot, cold, or  humid.  Avoid people who have a sickness you can catch (contagious).  Keep all follow-up visits. Contact a doctor if:  You cough up more mucus than usual.  There is a change in the color or thickness of the mucus.  It is harder to breathe than usual.  Your breathing is faster than usual.  You have trouble sleeping.  You need to use your medicines more often than usual.  You have trouble doing your normal activities such as getting dressed or walking around the house. Get help right away if:  You have shortness of breath while resting.  You have shortness of breath that stops you from: ? Being able to talk. ? Doing normal activities.  Your chest hurts for longer than 5 minutes.  Your skin color is more blue than usual.  Your pulse oximeter shows that you have low oxygen for longer than 5 minutes.  You have a fever.  You feel too tired to breathe normally. These symptoms may represent a serious problem that is an emergency. Do not wait to see if the symptoms will go away. Get medical help right away. Call your local emergency services (911 in the [place] Do not drive yourself to the hospital. Summary  Chronic obstructive pulmonary disease (COPD) is a long-term lung problem.  The way your lungs work will never return to normal. Usually the condition gets worse over time. There are things you can do to keep yourself as healthy as possible.  Take over-the-counter and prescription medicines only as told by your doctor.  If you smoke, stop. Smoking makes the problem worse. This information is not intended to replace advice given to you by your health care provider. Make sure you discuss any questions you have with your health care provider. Document Revised: 03/27/2020 Document Reviewed: 03/27/2020 Elsevier Patient Education  2021 Elsevier Inc.   

## 2020-09-18 NOTE — Telephone Encounter (Signed)
Spoke with phar as per dsk is 12 days taper and fixed pres

## 2020-09-20 ENCOUNTER — Ambulatory Visit: Payer: Medicare Other | Admitting: Internal Medicine

## 2020-09-24 ENCOUNTER — Other Ambulatory Visit: Payer: Self-pay | Admitting: Cardiology

## 2020-09-25 ENCOUNTER — Telehealth: Payer: Self-pay | Admitting: Primary Care

## 2020-09-25 NOTE — Telephone Encounter (Signed)
Rec'd call from daughter, Evangeline Dakin, wanting to cancel the 09/26/20 Palliative f/u visit, I offered to reschedule visit and daughter stated that she would call back to reschedule.  Notified Palliative NP.

## 2020-09-26 ENCOUNTER — Other Ambulatory Visit: Payer: Medicare Other | Admitting: Primary Care

## 2020-09-27 ENCOUNTER — Telehealth: Payer: Self-pay

## 2020-09-27 NOTE — Telephone Encounter (Signed)
Pt daughter called that she still coughing advised to give few days finished prednisone and also drink plenty of water and used need every 4 hrs as needed and used all inhaler and used delsym for cough as needed and call us back if not feeling better

## 2020-10-04 ENCOUNTER — Ambulatory Visit: Payer: Medicare Other | Admitting: Physician Assistant

## 2020-10-04 ENCOUNTER — Other Ambulatory Visit: Payer: Self-pay

## 2020-10-04 ENCOUNTER — Encounter: Payer: Self-pay | Admitting: Physician Assistant

## 2020-10-04 DIAGNOSIS — J9611 Chronic respiratory failure with hypoxia: Secondary | ICD-10-CM

## 2020-10-04 DIAGNOSIS — Z9981 Dependence on supplemental oxygen: Secondary | ICD-10-CM

## 2020-10-04 DIAGNOSIS — J45901 Unspecified asthma with (acute) exacerbation: Secondary | ICD-10-CM

## 2020-10-04 DIAGNOSIS — J441 Chronic obstructive pulmonary disease with (acute) exacerbation: Secondary | ICD-10-CM

## 2020-10-04 MED ORDER — PREDNISONE 5 MG PO TABS: 20.0000 mg | ORAL_TABLET | Freq: Every day | ORAL | 0 refills | Status: DC

## 2020-10-04 MED ORDER — DOXYCYCLINE HYCLATE 100 MG PO TABS: 100.0000 mg | ORAL_TABLET | Freq: Two times a day (BID) | ORAL | 0 refills | Status: DC

## 2020-10-04 NOTE — Progress Notes (Signed)
[facility] [address] [place], [place] [id number]  Pulmonary Sleep Medicine   Office Visit Note  Patient Name: Peggy [name] DOB: [birth year] MRN [id number]  Date of Service: 10/07/2020  Complaints/HPI: Pt presents for routine pulmonary follow up. She has been having an ongoing cough. Stopped the amiodorone to see if it was a culprit--cardiology aware of this and will hold for several days. This is also why BP is elevated, but cardiology is monitoring. She is using her inhaler BID and nebulizer 3-4 times per day. Used albuterol inhaler yesterday. Feels SOB and is wheezing and coughing all day. Cough is worse at night. Prednisone was finished on Monday and completed ABX given last visit.  Does not feel any improvement.  ROS  General: (-) fever, (-) chills, (-) night sweats, (-) weakness Skin: (-) rashes, (-) itching,. Eyes: (-) visual changes, (-) redness, (-) itching. Nose and Sinuses: (-) nasal stuffiness or itchiness, (-) postnasal drip, (-) nosebleeds, (-) sinus trouble. Mouth and Throat: (-) sore throat, (-) hoarseness. Neck: (-) swollen glands, (-) enlarged thyroid, (-) neck pain. Respiratory: + cough, (-) bloody sputum, + shortness of breath, + wheezing. Cardiovascular: - ankle swelling, (-) chest pain. Lymphatic: (-) lymph node enlargement. Neurologic: (-) numbness, (-) tingling. Psychiatric: (-) anxiety, (-) depression   Current Medication: Outpatient Encounter Medications as of 10/04/2020  Medication Sig  . acetaminophen (TYLENOL) 500 MG tablet Take 500-1,000 mg by mouth every 6 (six) hours as needed for mild pain or moderate pain.  Marland Kitchen. ALPRAZolam (XANAX) 0.25 MG tablet TAKE ONE TAB PO QHS FOR SLEEP  . amiodarone (PACERONE) 200 MG tablet Take 1 tablet (200 mg total) by mouth 2 (two) times daily.  Marland Kitchen. atorvastatin (LIPITOR) 80 MG tablet Take 1 tablet (80 mg total) by mouth daily.  . budesonide-formoterol (SYMBICORT) 160-4.5 MCG/ACT inhaler Inhale 2 puffs into the  lungs 2 (two) times daily.  . chlorpheniramine-HYDROcodone (TUSSIONEX PENNKINETIC ER) 10-8 MG/5ML SUER Take 5 cc at bedtime at bed time  . Cholecalciferol (VITAMIN D3) 5000 units TABS Take 5,000 Units by mouth daily.  . clopidogrel (PLAVIX) 75 MG tablet Take 1 tablet (75 mg total) by mouth daily with breakfast.  . diphenoxylate-atropine (LOMOTIL) 2.5-0.025 MG tablet Take 1 tablet by mouth 2 (two) times daily as needed for diarrhea or loose stools.  Marland Kitchen. doxycycline (VIBRA-TABS) 100 MG tablet Take 1 tablet (100 mg total) by mouth 2 (two) times daily.  Marland Kitchen. esomeprazole (NEXIUM) 20 MG capsule Take 20 mg by mouth 2 (two) times daily.  Marland Kitchen. gabapentin (NEURONTIN) 100 MG capsule Take 100 mg by mouth at bedtime.   Marland Kitchen. HUMULIN 70/30 KWIKPEN (70-30) 100 UNIT/ML KwikPen Inject 35 Units into the skin 2 (two) times daily. (Patient taking differently: Inject 32 Units into the skin 2 (two) times daily.)  . hydrOXYzine (ATARAX/VISTARIL) 25 MG tablet Take 25 mg by mouth 4 (four) times daily as needed for itching.  Marland Kitchen. ipratropium-albuterol (DUONEB) 0.5-2.5 (3) MG/3ML SOLN Take 3 mLs by nebulization every 6 (six) hours as needed (SOB).  . irbesartan (AVAPRO) 150 MG tablet Take 1 tablet (150 mg total) by mouth daily.  . isosorbide mononitrate (IMDUR) 30 MG 24 hr tablet Take 30 mg by mouth daily.  Marland Kitchen. levothyroxine (SYNTHROID) 50 MCG tablet Take 50 mcg by mouth daily before breakfast.  . metoprolol succinate (TOPROL-XL) 100 MG 24 hr tablet Take 1 tablet (100 mg total) by mouth daily. Take with or immediately following a meal.  . predniSONE (DELTASONE) 5 MG tablet Take  4 tablets (20 mg total) by mouth daily with breakfast. Take 4 tablet daily for 1 week, then Take 3 tablets daily for 2 days, then Take 2 tablets daily 2 days, thenTake 1 tablet daily  . traZODone (DESYREL) 50 MG tablet Take 1 tablet every night for sleep  . trospium (SANCTURA) 20 MG tablet Take 1 tablet (20 mg total) by mouth daily.  . VENTOLIN HFA 108 (90 Base)  MCG/ACT inhaler Inhale 2 puffs into the lungs every 6 (six) hours as needed for wheezing or shortness of breath.   . vitamin B-12 (CYANOCOBALAMIN) 1000 MCG tablet Take 1,000 mcg by mouth daily.  Marland Kitchen azithromycin (ZITHROMAX) 250 MG tablet As directed zpk (Patient not taking: Reported on 10/04/2020)  . predniSONE (DELTASONE) 10 MG tablet 12 day prednisone taper (Patient not taking: Reported on 10/04/2020)   No facility-administered encounter medications on file as of 10/04/2020.    Surgical History: Past Surgical History:  Procedure Laterality Date  . ABDOMINAL HYSTERECTOMY  1970  . BACK SURGERY  2013  . colectomy Right 10/08/2013   Dr. Egbert Garibaldi  . CORONARY ANGIOPLASTY WITH STENT PLACEMENT    . CORONARY/GRAFT ACUTE MI REVASCULARIZATION N/A 07/02/2020   Procedure: Coronary/Graft Acute MI Revascularization;  Surgeon: Iran Ouch, MD;  Location: [facility] INVASIVE CV LAB;  Service: Cardiovascular;  Laterality: N/A;  . LEFT HEART CATH AND CORONARY ANGIOGRAPHY N/A 07/02/2020   Procedure: LEFT HEART CATH AND CORONARY ANGIOGRAPHY;  Surgeon: Iran Ouch, MD;  Location: [facility] INVASIVE CV LAB;  Service: Cardiovascular;  Laterality: N/A;    Medical History: Past Medical History:  Diagnosis Date  . A-fib (HCC)   . Allergy   . Arthritis   . COPD (chronic obstructive pulmonary disease) (HCC)   . Diabetes mellitus without complication (HCC)   . GERD (gastroesophageal reflux disease)   . Hyperlipidemia   . Hypertension   . Myocardial infarction (HCC)   . Oxygen dependent    2 liters  . Stroke [facility])    5 years ago per daughter  . TIA (transient ischemic attack)     Family History: Family History  Problem Relation Age of Onset  . Breast cancer Mother   . Pancreatitis Father   . Breast cancer Sister   . Lung cancer Brother   . Melanoma Brother   . Throat cancer Brother     Social History: Social History   Socioeconomic History  . Marital status: Widowed    Spouse name: Not on file  .  Number of children: 3  . Years of education: H/S  . Highest education level: Not on file  Occupational History  . Occupation: Retired  Tobacco Use  . Smoking status: Former Smoker    Packs/day: 1.00    Years: 30.00    Pack years: 30.00    Quit date: 11/30/1999    Years since quitting: 20.8  . Smokeless tobacco: Never Used  Vaping Use  . Vaping Use: Never used  Substance and Sexual Activity  . Alcohol use: No  . Drug use: No  . Sexual activity: Not on file  Other Topics Concern  . Not on file  Social History Narrative  . Not on file   Social Determinants of Health   Financial Resource Strain: Not on file  Food Insecurity: Not on file  Transportation Needs: Not on file  Physical Activity: Not on file  Stress: Not on file  Social Connections: Not on file  Intimate Partner Violence: Not on file  Vital Signs: Blood pressure (!) 172/85, pulse 66, temperature 98.6 F (37 C), resp. rate 16, height 5\' 3"  (1.6 m), weight 160 lb (72.6 kg), SpO2 95 %.  Examination: General Appearance: The patient is well-developed, well-nourished, and in no distress. Skin: [name] inspection of skin unremarkable. Head: normocephalic, no [name] deformities. Eyes: no [name] deformities noted. ENT: ears appear grossly normal no exudates. Neck: Supple. No thyromegaly. No LAD. Respiratory: Rhonchi present throughout. Cardiovascular: Normal S1 and S2 without murmur or rub. Extremities: No cyanosis. pulses are equal. Neurologic: Alert and oriented. No involuntary movements.  LABS: No results found for this or any previous visit (from the past 2160 hour(s)).  Radiology: CT Chest Wo Contrast  Result Date: 09/12/2020 CLINICAL DATA:  87 year old with persistent cough for 2 months, worse at night. EXAM: CT CHEST WITHOUT CONTRAST TECHNIQUE: Multidetector CT imaging of the chest was performed following the standard protocol without IV contrast. COMPARISON:  Most recent radiograph 04/14/2020. Most recent  chest CT 11/22/2018, additional prior chest CT reviewed. FINDINGS: Cardiovascular: Moderate atherosclerosis of the thoracic aorta and branch vessels. Mild multi chamber cardiomegaly. Coronary artery calcifications versus stents. No significant pericardial effusion. Mediastinum/Nodes: Scattered small mediastinal lymph nodes are not enlarged by size criteria. Limited assessment for hilar adenopathy on this noncontrast exam. No bulky and there adenopathy. Small hiatal hernia again seen. Lungs/Pleura: Mild apical predominant emphysema. Well-circumscribed 8 mm nodule in the perifissural left lower lobe, series 3, image 59, stable since 2015, and considered benign. Multiple additional small pulmonary nodules, some of which are calcified, are also stable. There is biapical pleuroparenchymal scarring. No consolidation or acute airspace disease. No findings of pulmonary edema. No pleural fluid. The trachea and central bronchi are patent. Upper Abdomen: Cyst in the left lobe of the liver. No acute upper abdominal findings. Musculoskeletal: There are no acute or suspicious osseous abnormalities. No chest wall soft tissue abnormalities. IMPRESSION: 1. No acute findings. 2. Mild emphysema.  No other explanation for cough. 3. Scattered pulmonary nodules, largest measuring 8 mm, stable over multiple prior exams and considered benign. 4. Mild cardiomegaly. Coronary artery calcifications versus stents. Aortic Atherosclerosis (ICD10-I70.0) and Emphysema (ICD10-J43.9). Electronically Signed   By: 2016 M.D.   On: 09/12/2020 23:17    No results found.  CT Chest Wo Contrast  Result Date: 09/12/2020 CLINICAL DATA:  87 year old with persistent cough for 2 months, worse at night. EXAM: CT CHEST WITHOUT CONTRAST TECHNIQUE: Multidetector CT imaging of the chest was performed following the standard protocol without IV contrast. COMPARISON:  Most recent radiograph 04/14/2020. Most recent chest CT 11/22/2018, additional prior  chest CT reviewed. FINDINGS: Cardiovascular: Moderate atherosclerosis of the thoracic aorta and branch vessels. Mild multi chamber cardiomegaly. Coronary artery calcifications versus stents. No significant pericardial effusion. Mediastinum/Nodes: Scattered small mediastinal lymph nodes are not enlarged by size criteria. Limited assessment for hilar adenopathy on this noncontrast exam. No bulky and there adenopathy. Small hiatal hernia again seen. Lungs/Pleura: Mild apical predominant emphysema. Well-circumscribed 8 mm nodule in the perifissural left lower lobe, series 3, image 59, stable since 2015, and considered benign. Multiple additional small pulmonary nodules, some of which are calcified, are also stable. There is biapical pleuroparenchymal scarring. No consolidation or acute airspace disease. No findings of pulmonary edema. No pleural fluid. The trachea and central bronchi are patent. Upper Abdomen: Cyst in the left lobe of the liver. No acute upper abdominal findings. Musculoskeletal: There are no acute or suspicious osseous abnormalities. No chest wall soft tissue abnormalities. IMPRESSION: 1. No acute  findings. 2. Mild emphysema.  No other explanation for cough. 3. Scattered pulmonary nodules, largest measuring 8 mm, stable over multiple prior exams and considered benign. 4. Mild cardiomegaly. Coronary artery calcifications versus stents. Aortic Atherosclerosis (ICD10-I70.0) and Emphysema (ICD10-J43.9). Electronically Signed   By: Narda Rutherford M.D.   On: 09/12/2020 23:17      Assessment and Plan: Patient Active Problem List   Diagnosis Date Noted  . ST elevation myocardial infarction (STEMI) of lateral wall (HCC) 07/02/2020  . Abnormality of gait 05/02/2020  . Atrial fibrillation with rapid ventricular response (HCC) 04/14/2020  . Melena 04/14/2020  . COPD with acute exacerbation (HCC) 03/30/2020  . Elevated troponin level 03/30/2020  . Leukocytosis 03/30/2020  . COPD (chronic  obstructive pulmonary disease) (HCC) 03/22/2020  . Chest pain with high risk for cardiac etiology 11/09/2019  . Osteoarthritis of multiple joints 03/01/2019  . Uncontrolled hypertension 11/22/2018  . Primary osteoarthritis of both knees 09/07/2018  . Chronic respiratory failure with hypoxia (HCC) 10/08/2017  . Simple chronic bronchitis (HCC) 10/08/2017  . SOB (shortness of breath) 09/24/2017  . History of stroke 08/17/2017  . Acute respiratory failure with hypoxia and hypercarbia (HCC) 08/07/2017  . Respiratory failure (HCC) 08/07/2017  . History of GI bleed 07/07/2017  . Atrial fibrillation with RVR (HCC) 06/30/2017  . Bilateral carotid artery disease (HCC) 04/14/2017  . Healthcare maintenance 04/14/2017  . Dizziness 04/07/2017  . 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 TIA (transient ischemic attack) 12/10/2016  . CVA (cerebral vascular accident) (HCC) 12/09/2016  . Bilateral arm pain 11/27/2016  . Myocardial infarction (HCC) 07/30/2015  . Urinary frequency 02/09/2015  . RUQ abdominal pain 01/26/2015  . Shortness of breath 01/26/2015  . Anxiety 01/26/2015  . Chronic pruritus 12/21/2014  . Cerebral vascular accident (HCC) 11/16/2014  . Chronic low back pain 11/16/2014  . Adult hypothyroidism 11/15/2014  . Allergic rhinitis 11/15/2014  . Body mass index (BMI) of 28.0-28.9 in adult 11/15/2014  . Arthritis 11/15/2014  . Cramps of lower extremity 11/15/2014  . Essential (primary) hypertension 11/15/2014  . Acid reflux 11/15/2014  . Hypercholesteremia 11/15/2014  . Cannot sleep 11/15/2014  . Psoriasis 11/15/2014  . Itch of skin 11/15/2014  . Restless leg 11/15/2014  . Type 2 diabetes mellitus with other specified complication (HCC) 11/15/2014  . Breath shortness 11/15/2014  . Dermatitis due to unknown cause 04/10/2014  . CAD (coronary artery disease) 02/20/2014  . AF (paroxysmal atrial fibrillation) (HCC) 02/20/2014  . Temporary cerebral vascular  dysfunction 02/20/2014  . DD (diverticular disease) 10/05/2013    1. Chronic obstructive pulmonary disease with acute exacerbation (HCC) Exacerbation has not improved following recent antibiotic and short course of prednisone. Will start on doxycycline twice daily for 10 days and begin a longer course of prednisone taper.  Patient refuses chest x-ray at this time, however if not improving following current treatment will consider imaging studies at next visit.  Continue inhalers as prescribed. - predniSONE (DELTASONE) 5 MG tablet; Take 4 tablets (20 mg total) by mouth daily with breakfast. Take 4 tablet daily for 1 week, then Take 3 tablets daily for 2 days, then Take 2 tablets daily 2 days, thenTake 1 tablet daily  Dispense: 100 tablet; Refill: 0 - doxycycline (VIBRA-TABS) 100 MG tablet; Take 1 tablet (100 mg total) by mouth 2 (two) times daily.  Dispense: 20 tablet; Refill: 0  2. Chronic respiratory failure with hypoxia (HCC) Oxygen therapy will be continued at current settings  3. Oxygen dependent  Continue oxygen    General Counseling: I have discussed the findings of the evaluation and examination with Peggy [name].  I have also discussed any further diagnostic evaluation thatmay be needed or ordered today. Peggy [name] verbalizes understanding of the findings of todays visit. We also reviewed her medications today and discussed drug interactions and side effects including but not limited excessive drowsiness and altered mental states. We also discussed that there is always a risk not just to her but also people around her. she has been encouraged to call the office with any questions or concerns that should arise related to todays visit.  No orders of the defined types were placed in this encounter.    Time spent: 12  I have personally obtained a history, examined the patient, evaluated laboratory and imaging results, formulated the assessment and plan and placed orders. This patient was seen by Lynn Ito, PA-C in collaboration with Dr. Freda Munro as a part of collaborative care agreement.     Yevonne Pax, MD [facility] Pulmonary and Critical Care Sleep medicine

## 2020-10-07 NOTE — Patient Instructions (Signed)
Chronic Obstructive Pulmonary Disease  Chronic obstructive pulmonary disease (COPD) is a long-term (chronic) lung problem. When you have COPD, it is hard for air to get in and out of your lungs. Usually the condition gets worse over time, and your lungs will never return to normal. There are things you can do to keep yourself as healthy as possible. What are the causes?  Smoking. This is the most common cause.  Certain genes passed from parent to child (inherited). What increases the risk?  Being exposed to secondhand smoke from cigarettes, pipes, or cigars.  Being exposed to chemicals and other irritants, such as fumes and dust in the work environment.  Having chronic lung conditions or infections. What are the signs or symptoms?  Shortness of breath, especially during physical activity.  A long-term cough with a large amount of thick mucus. Sometimes, the cough may not have any mucus (dry cough).  Wheezing.  Breathing quickly.  Skin that looks gray or blue, especially in the fingers, toes, or lips.  Feeling tired (fatigue).  Weight loss.  Chest tightness.  Having infections often.  Episodes when breathing symptoms become much worse (exacerbations). At the later stages of this disease, you may have swelling in the ankles, feet, or legs. How is this treated?  Taking medicines.  Quitting smoking, if you smoke.  Rehabilitation. This includes steps to make your body work better. It may involve a team of specialists.  Doing exercises.  Making changes to your diet.  Using oxygen.  Lung surgery.  Lung transplant.  Comfort measures (palliative care). Follow these instructions at home: Medicines  Take over-the-counter and prescription medicines only as told by your doctor.  Talk to your doctor before taking any cough or allergy medicines. You may need to avoid medicines that cause your lungs to be dry. Lifestyle  If you smoke, stop smoking. Smoking makes the  problem worse.  Do not smoke or use any products that contain nicotine or tobacco. If you need help quitting, ask your doctor.  Avoid being around things that make your breathing worse. This may include smoke, chemicals, and fumes.  Stay active, but remember to rest as well.  Learn and use tips on how to manage stress and control your breathing.  Make sure you get enough sleep. Most adults need at least 7 hours of sleep every night.  Eat healthy foods. Eat smaller meals more often. Rest before meals. Controlled breathing Learn and use tips on how to control your breathing as told by your doctor. Try:  Breathing in (inhaling) through your nose for 1 second. Then, pucker your lips and breath out (exhale) through your lips for 2 seconds.  Putting one hand on your belly (abdomen). Breathe in slowly through your nose for 1 second. Your hand on your belly should move out. Pucker your lips and breathe out slowly through your lips. Your hand on your belly should move in as you breathe out.   Controlled coughing Learn and use controlled coughing to clear mucus from your lungs. Follow these steps: 1. Lean your head a little forward. 2. Breathe in deeply. 3. Try to hold your breath for 3 seconds. 4. Keep your mouth slightly open while coughing 2 times. 5. Spit any mucus out into a tissue. 6. Rest and do the steps again 1 or 2 times as needed. General instructions  Make sure you get all the shots (vaccines) that your doctor recommends. Ask your doctor about a flu shot and a pneumonia shot.    Use oxygen therapy and pulmonary rehabilitation if told by your doctor. If you need home oxygen therapy, ask your doctor if you should buy a tool to measure your oxygen level (oximeter).  Make a COPD action plan with your doctor. This helps you to know what to do if you feel worse than usual.  Manage any other conditions you have as told by your doctor.  Avoid going outside when it is very hot, cold, or  humid.  Avoid people who have a sickness you can catch (contagious).  Keep all follow-up visits. Contact a doctor if:  You cough up more mucus than usual.  There is a change in the color or thickness of the mucus.  It is harder to breathe than usual.  Your breathing is faster than usual.  You have trouble sleeping.  You need to use your medicines more often than usual.  You have trouble doing your normal activities such as getting dressed or walking around the house. Get help right away if:  You have shortness of breath while resting.  You have shortness of breath that stops you from: ? Being able to talk. ? Doing normal activities.  Your chest hurts for longer than 5 minutes.  Your skin color is more blue than usual.  Your pulse oximeter shows that you have low oxygen for longer than 5 minutes.  You have a fever.  You feel too tired to breathe normally. These symptoms may represent a serious problem that is an emergency. Do not wait to see if the symptoms will go away. Get medical help right away. Call your local emergency services (911 in the [place] Do not drive yourself to the hospital. Summary  Chronic obstructive pulmonary disease (COPD) is a long-term lung problem.  The way your lungs work will never return to normal. Usually the condition gets worse over time. There are things you can do to keep yourself as healthy as possible.  Take over-the-counter and prescription medicines only as told by your doctor.  If you smoke, stop. Smoking makes the problem worse. This information is not intended to replace advice given to you by your health care provider. Make sure you discuss any questions you have with your health care provider. Document Revised: 03/27/2020 Document Reviewed: 03/27/2020 Elsevier Patient Education  2021 Elsevier Inc.   

## 2020-11-01 ENCOUNTER — Ambulatory Visit: Payer: Medicare Other | Admitting: Internal Medicine

## 2020-11-06 ENCOUNTER — Telehealth: Payer: Self-pay

## 2020-11-06 ENCOUNTER — Other Ambulatory Visit: Payer: Self-pay

## 2020-11-06 NOTE — Telephone Encounter (Signed)
Pt daughter called that pt was fatigue and shortness breath due to prednisone taking daily as per [facility] advised pt daughter to stopped prednisone for now until her appt next week and she was she is feeling

## 2020-11-12 ENCOUNTER — Telehealth: Payer: Self-pay

## 2020-11-12 NOTE — Telephone Encounter (Signed)
Spoke to pt's daughter about her stopping the prednisone and was suppose to fup but they canceled her appt due to pt falling and in a lot of pain.  Next appt is schedule in Aug 2022.  Pt needs to be seen sooner to discuss the effects pt was having to determine if it was the prednisone so pt needs to be scheduled with lauren if not a sooner appt on [facility] schedule.

## 2020-11-15 ENCOUNTER — Ambulatory Visit: Payer: Medicare Other | Admitting: Internal Medicine

## 2020-11-22 ENCOUNTER — Ambulatory Visit: Payer: Medicare Other | Admitting: Dermatology

## 2020-11-22 ENCOUNTER — Other Ambulatory Visit: Payer: Self-pay

## 2020-11-22 ENCOUNTER — Encounter: Payer: Self-pay | Admitting: Physician Assistant

## 2020-11-22 ENCOUNTER — Ambulatory Visit: Payer: Medicare Other | Admitting: Physician Assistant

## 2020-11-22 VITALS — Ht 62.0 in | Wt 169.0 lb

## 2020-11-22 DIAGNOSIS — L821 Other seborrheic keratosis: Secondary | ICD-10-CM | POA: Diagnosis not present

## 2020-11-22 DIAGNOSIS — D692 Other nonthrombocytopenic purpura: Secondary | ICD-10-CM | POA: Diagnosis not present

## 2020-11-22 DIAGNOSIS — L309 Dermatitis, unspecified: Secondary | ICD-10-CM | POA: Diagnosis not present

## 2020-11-22 DIAGNOSIS — J9611 Chronic respiratory failure with hypoxia: Secondary | ICD-10-CM

## 2020-11-22 DIAGNOSIS — J449 Chronic obstructive pulmonary disease, unspecified: Secondary | ICD-10-CM | POA: Diagnosis not present

## 2020-11-22 DIAGNOSIS — L853 Xerosis cutis: Secondary | ICD-10-CM

## 2020-11-22 DIAGNOSIS — Z9981 Dependence on supplemental oxygen: Secondary | ICD-10-CM | POA: Diagnosis not present

## 2020-11-22 NOTE — Progress Notes (Signed)
   New Patient Visit  Subjective  Peggy [name] is a 87 y.o. female who presents for the following: hx of itching/rash (Back, has resolved, used [facility] 0.025%).  She would like other areas evaluated.  Patient accompanied by daughter who contributes to history.  The following portions of the chart were reviewed this encounter and updated as appropriate:   Tobacco  Allergies  Meds  Problems  Med Hx  Surg Hx  Fam Hx      Review of Systems:  No other skin or systemic complaints except as noted in HPI or Assessment and Plan.  Objective  Well appearing patient in no apparent distress; mood and affect are within normal limits.  A focused examination was performed including back. Relevant physical exam findings are noted in the Assessment and Plan.  Mid Back Pinkness back   Assessment & Plan   Seborrheic Keratoses - Stuck-on, waxy, tan-brown papules and/or plaques  - Benign-appearing - Discussed benign etiology and prognosis. - Observe - Call for any changes  Xerosis - diffuse xerotic patches - recommend gentle, hydrating skin care - gentle skin care handout given  Purpura - Chronic; persistent and recurrent.  Treatable, but not curable. - Violaceous macules and patches - Benign - Related to trauma, age, sun damage and/or use of blood thinners, chronic use of topical and/or oral steroids - Observe - Can use OTC arnica containing moisturizer such as Dermend Bruise Formula if desired - Call for worsening or other concerns  Eczema, unspecified type Mid Back  Atopic dermatitis (eczema) is a chronic, relapsing, pruritic condition that can significantly affect quality of life. It is often associated with allergic rhinitis and/or asthma and can require treatment with topical medications, phototherapy, or in severe cases a biologic medication called Dupixent in children and adults.    Improved from [facility] 0.025% cr  Recommend mild soap and moisturizing cream 1-2 times daily.   Gentle skin care handout provided.  Recommend Cerave Cream.  Continue Dove for sensitive skin  Return if symptoms worsen or fail to improve.  I, Ardis Rowan, RMA, am acting as scribe for Armida Sans, MD .  Documentation: I have reviewed the above documentation for accuracy and completeness, and I agree with the above.  Armida Sans, MD

## 2020-11-22 NOTE — Progress Notes (Signed)
[facility] [address] [place], [place] [address]  Internal MEDICINE  Telephone Visit  Patient Name: Peggy [name]  [id number]  [id number]  Date of Service: 12/04/2020  I connected with the patient at 1010 by telephone and verified the patients identity using two identifiers.   I discussed the limitations, risks, security and privacy concerns of performing an evaluation and management service by telephone and the availability of in person appointments. I also discussed with the patient that there may be a patient responsible charge related to the service.  The patient expressed understanding and agrees to proceed.    Chief Complaint  Patient presents with   Telephone Screen    [phone]   Telephone Assessment   COPD    HPI Pt is here for virtual follow up -Not coughing as much. Stopped the prednisone after being on it several weeks because she was feeling fatigued and eating a lot and BG was rising making it hard to sleep. -Did fall on Thursday the 9th and went to urgent care and found broken ribs, then went to see cardiology. She is working on taking deep breaths -No wheezing, spells of SOB  Current Medication: Outpatient Encounter Medications as of 11/22/2020  Medication Sig   acetaminophen (TYLENOL) 500 MG tablet Take 500-1,000 mg by mouth every 6 (six) hours as needed for mild pain or moderate pain.   ALPRAZolam (XANAX) 0.25 MG tablet TAKE ONE TAB PO QHS FOR SLEEP (Patient not taking: Reported on 11/22/2020)   amiodarone (PACERONE) 200 MG tablet Take 1 tablet (200 mg total) by mouth 2 (two) times daily.   atorvastatin (LIPITOR) 80 MG tablet Take 1 tablet (80 mg total) by mouth daily.   budesonide-formoterol (SYMBICORT) 160-4.5 MCG/ACT inhaler Inhale 2 puffs into the lungs 2 (two) times daily.   chlorpheniramine-HYDROcodone (TUSSIONEX PENNKINETIC ER) 10-8 MG/5ML SUER Take 5 cc at bedtime at bed time   Cholecalciferol (VITAMIN D3) 5000 units TABS Take 5,000 Units by  mouth daily.   clopidogrel (PLAVIX) 75 MG tablet Take 1 tablet (75 mg total) by mouth daily with breakfast.   diphenoxylate-atropine (LOMOTIL) 2.5-0.025 MG tablet Take 1 tablet by mouth 2 (two) times daily as needed for diarrhea or loose stools. (Patient not taking: Reported on 11/22/2020)   doxycycline (VIBRA-TABS) 100 MG tablet Take 1 tablet (100 mg total) by mouth 2 (two) times daily. (Patient not taking: Reported on 11/22/2020)   esomeprazole (NEXIUM) 20 MG capsule Take 20 mg by mouth 2 (two) times daily.   gabapentin (NEURONTIN) 100 MG capsule Take 100 mg by mouth at bedtime.    HUMULIN 70/30 KWIKPEN (70-30) 100 UNIT/ML KwikPen Inject 35 Units into the skin 2 (two) times daily. (Patient taking differently: Inject 32 Units into the skin 2 (two) times daily.)   hydrOXYzine (ATARAX/VISTARIL) 25 MG tablet Take 25 mg by mouth 4 (four) times daily as needed for itching.   ipratropium-albuterol (DUONEB) 0.5-2.5 (3) MG/3ML SOLN Take 3 mLs by nebulization every 6 (six) hours as needed (SOB).   irbesartan (AVAPRO) 150 MG tablet Take 1 tablet (150 mg total) by mouth daily.   isosorbide mononitrate (IMDUR) 30 MG 24 hr tablet Take 30 mg by mouth daily.   levothyroxine (SYNTHROID) 50 MCG tablet Take 50 mcg by mouth daily before breakfast.   metoprolol succinate (TOPROL-XL) 100 MG 24 hr tablet Take 1 tablet (100 mg total) by mouth daily. Take with or immediately following a meal.   traZODone (DESYREL) 50 MG tablet Take 1 tablet every  night for sleep   trospium (SANCTURA) 20 MG tablet Take 1 tablet (20 mg total) by mouth daily.   VENTOLIN HFA 108 (90 Base) MCG/ACT inhaler Inhale 2 puffs into the lungs every 6 (six) hours as needed for wheezing or shortness of breath.    vitamin B-12 (CYANOCOBALAMIN) 1000 MCG tablet Take 1,000 mcg by mouth daily.   [DISCONTINUED] azithromycin (ZITHROMAX) 250 MG tablet As directed zpk   [DISCONTINUED] predniSONE (DELTASONE) 10 MG tablet 12 day prednisone taper   predniSONE  (DELTASONE) 5 MG tablet Take 4 tablets (20 mg total) by mouth daily with breakfast. Take 4 tablet daily for 1 week, then Take 3 tablets daily for 2 days, then Take 2 tablets daily 2 days, thenTake 1 tablet daily (Patient not taking: Reported on 11/22/2020)   No facility-administered encounter medications on file as of 11/22/2020.    Surgical History: Past Surgical History:  Procedure Laterality Date   ABDOMINAL HYSTERECTOMY  1970   BACK SURGERY  2013   colectomy Right 10/08/2013   Dr. Egbert Garibaldi   CORONARY ANGIOPLASTY WITH STENT PLACEMENT     CORONARY/GRAFT ACUTE MI REVASCULARIZATION N/A 07/02/2020   Procedure: Coronary/Graft Acute MI Revascularization;  Surgeon: Iran Ouch, MD;  Location: [facility] INVASIVE CV LAB;  Service: Cardiovascular;  Laterality: N/A;   LEFT HEART CATH AND CORONARY ANGIOGRAPHY N/A 07/02/2020   Procedure: LEFT HEART CATH AND CORONARY ANGIOGRAPHY;  Surgeon: Iran Ouch, MD;  Location: [facility] INVASIVE CV LAB;  Service: Cardiovascular;  Laterality: N/A;    Medical History: Past Medical History:  Diagnosis Date   A-fib (HCC)    Allergy    Arthritis    COPD (chronic obstructive pulmonary disease) (HCC)    Diabetes mellitus without complication (HCC)    GERD (gastroesophageal reflux disease)    Hyperlipidemia    Hypertension    Myocardial infarction (HCC)    Oxygen dependent    2 liters   Stroke (HCC)    5 years ago per daughter   TIA (transient ischemic attack)     Family History: Family History  Problem Relation Age of Onset   Breast cancer Mother    Pancreatitis Father    Breast cancer Sister    Lung cancer Brother    Melanoma Brother    Throat cancer Brother     Social History   Socioeconomic History   Marital status: Widowed    Spouse name: Not on file   Number of children: 3   Years of education: H/S   Highest education level: Not on file  Occupational History   Occupation: Retired  Tobacco Use   Smoking status: Former    Packs/day: 1.00     Years: 30.00    Pack years: 30.00    Types: Cigarettes    Quit date: 11/30/1999    Years since quitting: 21.0   Smokeless tobacco: Never  Vaping Use   Vaping Use: Never used  Substance and Sexual Activity   Alcohol use: No   Drug use: No   Sexual activity: Not on file  Other Topics Concern   Not on file  Social History Narrative   Not on file   Social Determinants of Health   Financial Resource Strain: Not on file  Food Insecurity: Not on file  Transportation Needs: Not on file  Physical Activity: Not on file  Stress: Not on file  Social Connections: Not on file  Intimate Partner Violence: Not on file      Review of Systems  Constitutional:  Negative for chills, fatigue and unexpected weight change.  HENT:  Positive for postnasal drip. Negative for congestion, rhinorrhea, sneezing and sore throat.   Eyes:  Negative for redness.  Respiratory:  Positive for cough and shortness of breath. Negative for chest tightness and wheezing.   Cardiovascular:  Negative for chest pain and palpitations.  Gastrointestinal:  Negative for abdominal pain, constipation, diarrhea, nausea and vomiting.  Genitourinary:  Negative for dysuria and frequency.  Musculoskeletal:  Negative for arthralgias, back pain, joint swelling and neck pain.  Skin:  Negative for rash.  Neurological: Negative.  Negative for tremors and numbness.  Hematological:  Negative for adenopathy. Does not bruise/bleed easily.  Psychiatric/Behavioral:  Negative for behavioral problems (Depression), sleep disturbance and suicidal ideas. The patient is not nervous/anxious.    Vital Signs: Ht 5\' 2"  (1.575 m)   Wt 169 lb (76.7 kg)   BMI 30.91 kg/m    Observation/Objective:  Pt is able to carry out conversation   Assessment/Plan: 1. Chronic obstructive pulmonary disease, unspecified COPD type (HCC) Continue inhalers as prescribed  2. Chronic respiratory failure with hypoxia (HCC) Continue current oxygen  settings  3. Oxygen dependent Continue oxygen   General Counseling: Shayonna verbalizes understanding of the findings of today's phone visit and agrees with plan of treatment. I have discussed any further diagnostic evaluation that may be needed or ordered today. We also reviewed her medications today. she has been encouraged to call the office with any questions or concerns that should arise related to todays visit.    No orders of the defined types were placed in this encounter.   No orders of the defined types were placed in this encounter.   Time spent:30 Minutes    Dr Joyce Gross Internal medicine

## 2020-11-22 NOTE — Patient Instructions (Addendum)
If you have any questions or concerns for your doctor, please call our main line at [phone] and press option 4 to reach your doctor's medical assistant. If no one answers, please leave a voicemail as directed and we will return your call as soon as possible. Messages left after 4 pm will be answered the following business day.   You may also send [place] a message via [place]. We typically respond to MyChart messages within 1-2 business days.  For prescription refills, please ask your pharmacy to contact our office. Our fax number is [phone].  If you have an urgent issue when the clinic is closed that cannot wait until the next business day, you can page your doctor at the number below.    Please note that while we do our best to be available for urgent issues outside of office hours, we are not available 24/7.   If you have an urgent issue and are unable to reach [place], you may choose to seek medical care at your doctor's office, retail clinic, urgent care center, or emergency room.  If you have a medical emergency, please immediately call 911 or go to the emergency department.  Pager Numbers  - Dr. Nehemiah Massed: [phone]  - Dr. Laurence Ferrari: [phone]  - Dr. Nicole Kindred: [phone]  In the event of inclement weather, please call our main line at [phone] for an update on the status of any delays or closures.  Dermatology Medication Tips: Please keep the boxes that topical medications come in in order to help keep track of the instructions about where and how to use these. Pharmacies typically print the medication instructions only on the boxes and not directly on the medication tubes.   If your medication is too expensive, please contact our office at [phone] option 4 or send [place] a message through [place].   We are unable to tell what your co-pay for medications will be in advance as this is different depending on your insurance coverage. However, we may be able to find a substitute  medication at lower cost or fill out paperwork to get insurance to cover a needed medication.   If a prior authorization is required to get your medication covered by your insurance company, please allow [place] 1-2 business days to complete this process.  Drug prices often vary depending on where the prescription is filled and some pharmacies may offer cheaper prices.  The website www.goodrx.com contains coupons for medications through different pharmacies. The prices here do not account for what the cost may be with help from insurance (it may be cheaper with your insurance), but the website can give you the price if you did not use any insurance.  - You can print the associated coupon and take it with your prescription to the pharmacy.  - You may also stop by our office during regular business hours and pick up a GoodRx coupon card.  - If you need your prescription sent electronically to a different pharmacy, notify our office through [facility] or by phone at [phone] option 4.   Seborrheic Keratosis  What causes seborrheic keratoses? Seborrheic keratoses are harmless, common skin growths that first appear during adult life.  As time goes by, more growths appear.  Some people may develop a large number of them.  Seborrheic keratoses appear on both covered and uncovered body parts.  They are not caused by sunlight.  The tendency to develop seborrheic keratoses can be inherited.  They vary in color from skin-colored to  gray, brown, or even Carothers.  They can be either smooth or have a rough, warty surface.   Seborrheic keratoses are superficial and look as if they were stuck on the skin.  Under the microscope this type of keratosis looks like layers upon layers of skin.  That is why at times the top layer may seem to fall off, but the rest of the growth remains and re-grows.    Treatment Seborrheic keratoses do not need to be treated, but can easily be removed in the office.  Seborrheic  keratoses often cause symptoms when they rub on clothing or jewelry.  Lesions can be in the way of shaving.  If they become inflamed, they can cause itching, soreness, or burning.  Removal of a seborrheic keratosis can be accomplished by freezing, burning, or surgery. If any spot bleeds, scabs, or grows rapidly, please return to have it checked, as these can be an indication of a skin cancer.  Dry Skin Care  What causes dry skin?  Dry skin is common and results from inadequate moisture in the outer skin layers. Dry skin usually results from the excessive loss of moisture from the skin surface. This occurs due to two major factors: Normally the skin's oil glands deposit a layer of oil on the skin's surface. This layer of oil prevents the loss of moisture from the skin. Exposure to soaps, cleaners, solvents, and disinfectants removes this oily film, allowing water to escape. Water loss from the skin increases when the humidity is low. During winter months we spend a lot of time indoors where the air is heated. Heated air has very low humidity. This also contributes to dry skin.  A tendency for dry skin may accompany such disorders as eczema. Also, as people age, the number of functioning oil glands decreases, and the tendency toward dry skin can be a sensation of skin tightness when emerging from the shower.  How do I manage dry skin?  Humidify your environment. This can be accomplished by using a humidifier in your bedroom at night during winter months. Bathing can actually put moisture back into your skin if done right. Take the following steps while bathing to sooth dry skin: Avoid hot water, which only dries the skin and makes itching worse. Use warm water. Avoid washcloths or extensive rubbing or scrubbing. Use mild soaps like unscented Dove, Oil of Olay, Cetaphil, Basis, or CeraVe. If you take baths rather than showers, rinse off soap residue with clean water before getting out of tub. Once  out of the shower/tub, pat dry gently with a soft towel. Leave your skin damp. While still damp, apply any medicated ointment/cream you were prescribed to the affected areas. After you apply your medicated ointment/cream, then apply your moisturizer to your whole body.This is the most important step in dry skin care. If this is omitted, your skin will continue to be dry. The choice of moisturizer is also very important. In general, lotion will not provider enough moisture to severely dry skin because it is water based. You should use an ointment or cream. Moisturizers should also be unscented. Good choices include Vaseline (plain petrolatum), Aquaphor, Cetaphil, CeraVe, Vanicream, DML Forte, Aveeno moisture, or Eucerin Cream. Bath oils can be helpful, but do not replace the application of moisturizer after the bath. In addition, they make the tub slippery causing an increased risk for falls. Therefore, we do not recommend their use.

## 2020-11-28 ENCOUNTER — Encounter: Payer: Self-pay | Admitting: Dermatology

## 2020-12-04 ENCOUNTER — Other Ambulatory Visit: Payer: Self-pay

## 2020-12-04 ENCOUNTER — Telehealth: Payer: Self-pay

## 2020-12-04 NOTE — Patient Instructions (Signed)

## 2020-12-04 NOTE — Telephone Encounter (Signed)
Pt daughter called that pt coughing she like to know she can start her prednisone as per lauren advised her to start and she how she is feeling also call us back in 2 days and she also she can take mucinex for cough

## 2020-12-04 NOTE — Telephone Encounter (Signed)
Lmom to call us back due to she left message

## 2020-12-12 ENCOUNTER — Telehealth: Payer: Self-pay

## 2020-12-12 ENCOUNTER — Other Ambulatory Visit: Payer: Self-pay | Admitting: Physician Assistant

## 2020-12-12 DIAGNOSIS — J441 Chronic obstructive pulmonary disease with (acute) exacerbation: Secondary | ICD-10-CM

## 2020-12-12 MED ORDER — DOXYCYCLINE HYCLATE 100 MG PO TABS: 100.0000 mg | ORAL_TABLET | Freq: Two times a day (BID) | ORAL | 0 refills | Status: DC

## 2020-12-12 NOTE — Telephone Encounter (Signed)
Appt scheduled and medication was sent to pharmacy per Lauren.

## 2020-12-12 NOTE — Telephone Encounter (Signed)
Evangeline Dakin daughter of Melenie Minniear called stating that pt was told to start taking prednisonefor her cough, she is still having a lot of coughing and difficulty breathing and is wanting to know what can be done.  call back # [phone].  [facility] [place]

## 2020-12-12 NOTE — Telephone Encounter (Signed)
Pt  advised that she start prednisone back on 12/04/2020

## 2020-12-13 ENCOUNTER — Ambulatory Visit: Payer: Medicare Other | Admitting: Internal Medicine

## 2020-12-13 ENCOUNTER — Other Ambulatory Visit: Payer: Self-pay

## 2020-12-13 ENCOUNTER — Encounter: Payer: Self-pay | Admitting: Internal Medicine

## 2020-12-13 VITALS — BP 138/64 | HR 71 | Temp 97.9°F | Resp 16 | Ht 60.0 in | Wt 171.8 lb

## 2020-12-13 DIAGNOSIS — R059 Cough, unspecified: Secondary | ICD-10-CM

## 2020-12-13 DIAGNOSIS — R0602 Shortness of breath: Secondary | ICD-10-CM

## 2020-12-13 MED ORDER — ACETAMINOPHEN-CODEINE #3 300-30 MG PO TABS: ORAL_TABLET | ORAL | 0 refills | Status: DC

## 2020-12-13 MED ORDER — MONTELUKAST SODIUM 10 MG PO TABS: 10.0000 mg | ORAL_TABLET | Freq: Every day | ORAL | 3 refills | Status: DC

## 2020-12-13 NOTE — Progress Notes (Signed)
[facility] [address] [place], [place] [address]  Internal MEDICINE  Office Visit Note  Patient Name: Peggy [name]  [id number]  [id number]  Date of Service: 12/16/2020  Chief Complaint  Patient presents with   Shortness of Breath    Couching, wheezing    Acute Visit    HPI  Patient is seen today for pulmonary cross coverage.  Her daughter is in the room with her. She continues to complain about coughing and wheezing has been on multiple medications, and antibiotics cough suppressant etc. complains of cough mostly during the day and somewhat at night as well. complains of postnasal drip Her upper GI in 2020 showed severe gastroesophageal reflux extending to the level of the thoracic inlet. Patient reports allergies especially to pollen. Patient is not on any ACE inhibitors however she does take [facility] Avapro  CT chest reviewed again which showed mild emphysema Current Medication:.  Outpatient Encounter Medications as of 12/13/2020  Medication Sig   acetaminophen (TYLENOL) 500 MG tablet Take 500-1,000 mg by mouth every 6 (six) hours as needed for mild pain or moderate pain.   acetaminophen-codeine (TYLENOL #3) 300-30 MG tablet Take one tab po bid as needed   amiodarone (PACERONE) 200 MG tablet Take 1 tablet (200 mg total) by mouth 2 (two) times daily.   budesonide-formoterol (SYMBICORT) 160-4.5 MCG/ACT inhaler Inhale 2 puffs into the lungs 2 (two) times daily.   chlorpheniramine-HYDROcodone (TUSSIONEX PENNKINETIC ER) 10-8 MG/5ML SUER Take 5 cc at bedtime at bed time   Cholecalciferol (VITAMIN D3) 5000 units TABS Take 5,000 Units by mouth daily.   clopidogrel (PLAVIX) 75 MG tablet Take 1 tablet (75 mg total) by mouth daily with breakfast.   diphenoxylate-atropine (LOMOTIL) 2.5-0.025 MG tablet Take 1 tablet by mouth 2 (two) times daily as needed for diarrhea or loose stools.   doxycycline (VIBRA-TABS) 100 MG tablet Take 1 tablet (100 mg total) by mouth 2 (two) times  daily.   esomeprazole (NEXIUM) 20 MG capsule Take 20 mg by mouth 2 (two) times daily.   gabapentin (NEURONTIN) 100 MG capsule Take 100 mg by mouth at bedtime.    HUMULIN 70/30 KWIKPEN (70-30) 100 UNIT/ML KwikPen Inject 35 Units into the skin 2 (two) times daily. (Patient taking differently: Inject 32 Units into the skin 2 (two) times daily.)   hydrOXYzine (ATARAX/VISTARIL) 25 MG tablet Take 25 mg by mouth 4 (four) times daily as needed for itching.   ipratropium-albuterol (DUONEB) 0.5-2.5 (3) MG/3ML SOLN Take 3 mLs by nebulization every 6 (six) hours as needed (SOB).   irbesartan (AVAPRO) 150 MG tablet Take 1 tablet (150 mg total) by mouth daily.   isosorbide mononitrate (IMDUR) 30 MG 24 hr tablet Take 30 mg by mouth daily.   levothyroxine (SYNTHROID) 50 MCG tablet Take 50 mcg by mouth daily before breakfast.   metoprolol succinate (TOPROL-XL) 100 MG 24 hr tablet Take 1 tablet (100 mg total) by mouth daily. Take with or immediately following a meal.   montelukast (SINGULAIR) 10 MG tablet Take 1 tablet (10 mg total) by mouth at bedtime.   nitroGLYCERIN (NITROSTAT) 0.4 MG SL tablet Place under the tongue.   predniSONE (DELTASONE) 5 MG tablet Take 4 tablets (20 mg total) by mouth daily with breakfast. Take 4 tablet daily for 1 week, then Take 3 tablets daily for 2 days, then Take 2 tablets daily 2 days, thenTake 1 tablet daily   traZODone (DESYREL) 50 MG tablet Take 1 tablet every night for sleep  trospium (SANCTURA) 20 MG tablet Take 1 tablet (20 mg total) by mouth daily.   VENTOLIN HFA 108 (90 Base) MCG/ACT inhaler Inhale 2 puffs into the lungs every 6 (six) hours as needed for wheezing or shortness of breath.    vitamin B-12 (CYANOCOBALAMIN) 1000 MCG tablet Take 1,000 mcg by mouth daily.   ALPRAZolam (XANAX) 0.25 MG tablet TAKE ONE TAB PO QHS FOR SLEEP (Patient not taking: No sig reported)   atorvastatin (LIPITOR) 80 MG tablet Take 1 tablet (80 mg total) by mouth daily. (Patient not taking:  Reported on 12/13/2020)   No facility-administered encounter medications on file as of 12/13/2020.    Surgical History: Past Surgical History:  Procedure Laterality Date   ABDOMINAL HYSTERECTOMY  1970   BACK SURGERY  2013   colectomy Right 10/08/2013   Dr. Egbert Garibaldi   CORONARY ANGIOPLASTY WITH STENT PLACEMENT     CORONARY/GRAFT ACUTE MI REVASCULARIZATION N/A 07/02/2020   Procedure: Coronary/Graft Acute MI Revascularization;  Surgeon: Iran Ouch, MD;  Location: [facility] INVASIVE CV [facility];  Service: Cardiovascular;  Laterality: N/A;   LEFT HEART CATH AND CORONARY ANGIOGRAPHY N/A 07/02/2020   Procedure: LEFT HEART CATH AND CORONARY ANGIOGRAPHY;  Surgeon: Iran Ouch, MD;  Location: [facility] INVASIVE CV [facility];  Service: Cardiovascular;  Laterality: N/A;    Medical History: Past Medical History:  Diagnosis Date   A-fib (HCC)    Allergy    Arthritis    COPD (chronic obstructive pulmonary disease) (HCC)    Diabetes mellitus without complication (HCC)    GERD (gastroesophageal reflux disease)    Hyperlipidemia    Hypertension    Myocardial infarction (HCC)    Oxygen dependent    2 liters   Stroke (HCC)    5 years ago per daughter   TIA (transient ischemic attack)     Family History: Family History  Problem Relation Age of Onset   Breast cancer Mother    Pancreatitis Father    Breast cancer Sister    Lung cancer Brother    Melanoma Brother    Throat cancer Brother     Social History   Socioeconomic History   Marital status: Widowed    Spouse name: Not on file   Number of children: 3   Years of education: H/S   Highest education level: Not on file  Occupational History   Occupation: Retired  Tobacco Use   Smoking status: Former    Packs/day: 1.00    Years: 30.00    Pack years: 30.00    Types: Cigarettes    Quit date: 11/30/1999    Years since quitting: 21.0   Smokeless tobacco: Never  Vaping Use   Vaping Use: Never used  Substance and Sexual Activity   Alcohol use:  No   Drug use: No   Sexual activity: Not on file  Other Topics Concern   Not on file  Social History Narrative   Not on file   Social Determinants of Health   Financial Resource Strain: Not on file  Food Insecurity: Not on file  Transportation Needs: Not on file  Physical Activity: Not on file  Stress: Not on file  Social Connections: Not on file  Intimate Partner Violence: Not on file      Review of Systems  Constitutional:  Negative for chills, fatigue and unexpected weight change.  HENT:  Negative for congestion, rhinorrhea, sneezing and sore throat.   Eyes:  Negative for redness.  Respiratory:  Positive for cough. Negative for  chest tightness and shortness of breath.   Cardiovascular:  Negative for chest pain and palpitations.  Gastrointestinal:  Negative for abdominal pain, constipation, diarrhea, nausea and vomiting.  Genitourinary:  Negative for dysuria and frequency.  Musculoskeletal:  Negative for arthralgias, back pain, joint swelling and neck pain.  Skin:  Negative for rash.  Neurological: Negative.  Negative for tremors and numbness.  Hematological:  Negative for adenopathy. Does not bruise/bleed easily.  Psychiatric/Behavioral:  Negative for behavioral problems (Depression), sleep disturbance and suicidal ideas. The patient is not nervous/anxious.    Vital Signs: BP 138/64   Pulse 71   Temp 97.9 F (36.6 C)   Resp 16   Ht 5' (1.524 m)   Wt 171 lb 12.8 oz (77.9 kg)   SpO2 99%   BMI 33.55 kg/m    Physical Exam Constitutional:      Appearance: Normal appearance.  HENT:     Head: Normocephalic and atraumatic.     Nose: Nose normal.     Mouth/Throat:     Mouth: Mucous membranes are moist.     Pharynx: No posterior oropharyngeal erythema.  Eyes:     Extraocular Movements: Extraocular movements intact.     Pupils: Pupils are equal, round, and reactive to light.  Cardiovascular:     Pulses: Normal pulses.     Heart sounds: Normal heart sounds.   Pulmonary:     Effort: Pulmonary effort is normal. No respiratory distress.     Breath sounds: Normal breath sounds.  Neurological:     General: No focal deficit present.     Mental Status: She is alert.  Psychiatric:        Mood and Affect: Mood normal.        Behavior: Behavior normal.     Assessment/Plan: 1. Cough in adult This seems to be multifactorial.  A.  Severe GERD, probably need to see surgery for possible correction however not sure if she is a candidate for it. Continue Nexium as before  B.  Need to further look into her allergies induced asthma which will order allergy testing and Singulair, continue Symbicort as before - montelukast (SINGULAIR) 10 MG tablet; Take 1 tablet (10 mg total) by mouth at bedtime.  Dispense: 30 tablet; Refill: 3 - Allergy Test to look further into her allergies, immunotherapy might be beneficial   C. Might need CT sinuses in future.   D.  Might hold her Avapro in future, add low-dose Tylenol 3 for now , discussed about dependence on these medications as well - acetaminophen-codeine (TYLENOL #3) 300-30 MG tablet; Take one tab po bid as needed  Dispense: 15 tablet; Refill: 0   General Counseling: Rethel verbalizes understanding of the findings of todays visit and agrees with plan of treatment. I have discussed any further diagnostic evaluation that may be needed or ordered today. We also reviewed her medications today. she has been encouraged to call the office with any questions or concerns that should arise related to todays visit.    Orders Placed This Encounter  Procedures   Allergy Test    Meds ordered this encounter  Medications   montelukast (SINGULAIR) 10 MG tablet    Sig: Take 1 tablet (10 mg total) by mouth at bedtime.    Dispense:  30 tablet    Refill:  3   acetaminophen-codeine (TYLENOL #3) 300-30 MG tablet    Sig: Take one tab po bid as needed    Dispense:  15 tablet    Refill:  0    Total time spent:35 Minutes Time  spent includes review of chart, medications, test results, and follow up plan with the patient.   [place] Controlled Substance Database was reviewed by me.   Dr Lyndon Code Internal medicine

## 2021-01-01 ENCOUNTER — Telehealth: Payer: Self-pay

## 2021-01-01 NOTE — Telephone Encounter (Signed)
Pt's daughter called advising that pt is having trouble breathing and has increased her oxygen to 4 liters.  She advised that pt is taking the allergy medication DFK gave her and the prednisone and started back taking the antibiotic.  Per DFK she advised that it will be ok for pt to continue that medications and her daughter advised she will be going to her moms on 01/02/21 and I informed her that she can tell if her mom id doing ok or not and that if anything she needs to give [place] a call back.

## 2021-01-10 ENCOUNTER — Ambulatory Visit: Payer: Medicare Other | Admitting: Internal Medicine

## 2021-01-26 ENCOUNTER — Other Ambulatory Visit: Payer: Self-pay

## 2021-01-26 ENCOUNTER — Inpatient Hospital Stay
Admission: EM | Admit: 2021-01-26 | Discharge: 2021-01-31 | DRG: 871 | Payer: Medicare Other | Attending: Internal Medicine | Admitting: Internal Medicine

## 2021-01-26 ENCOUNTER — Inpatient Hospital Stay: Payer: Medicare Other

## 2021-01-26 ENCOUNTER — Emergency Department: Payer: Medicare Other

## 2021-01-26 DIAGNOSIS — N179 Acute kidney failure, unspecified: Secondary | ICD-10-CM | POA: Diagnosis present

## 2021-01-26 DIAGNOSIS — N1831 Chronic kidney disease, stage 3a: Secondary | ICD-10-CM | POA: Diagnosis present

## 2021-01-26 DIAGNOSIS — Z20822 Contact with and (suspected) exposure to covid-19: Secondary | ICD-10-CM | POA: Diagnosis present

## 2021-01-26 DIAGNOSIS — Z9981 Dependence on supplemental oxygen: Secondary | ICD-10-CM | POA: Diagnosis not present

## 2021-01-26 DIAGNOSIS — Z6834 Body mass index (BMI) 34.0-34.9, adult: Secondary | ICD-10-CM

## 2021-01-26 DIAGNOSIS — Z808 Family history of malignant neoplasm of other organs or systems: Secondary | ICD-10-CM

## 2021-01-26 DIAGNOSIS — E669 Obesity, unspecified: Secondary | ICD-10-CM | POA: Diagnosis present

## 2021-01-26 DIAGNOSIS — I252 Old myocardial infarction: Secondary | ICD-10-CM

## 2021-01-26 DIAGNOSIS — I130 Hypertensive heart and chronic kidney disease with heart failure and stage 1 through stage 4 chronic kidney disease, or unspecified chronic kidney disease: Secondary | ICD-10-CM | POA: Diagnosis present

## 2021-01-26 DIAGNOSIS — I480 Paroxysmal atrial fibrillation: Secondary | ICD-10-CM | POA: Diagnosis present

## 2021-01-26 DIAGNOSIS — Z8673 Personal history of transient ischemic attack (TIA), and cerebral infarction without residual deficits: Secondary | ICD-10-CM

## 2021-01-26 DIAGNOSIS — I5023 Acute on chronic systolic (congestive) heart failure: Secondary | ICD-10-CM | POA: Diagnosis present

## 2021-01-26 DIAGNOSIS — F419 Anxiety disorder, unspecified: Secondary | ICD-10-CM | POA: Diagnosis present

## 2021-01-26 DIAGNOSIS — Z66 Do not resuscitate: Secondary | ICD-10-CM | POA: Diagnosis present

## 2021-01-26 DIAGNOSIS — J449 Chronic obstructive pulmonary disease, unspecified: Secondary | ICD-10-CM | POA: Diagnosis present

## 2021-01-26 DIAGNOSIS — Z7189 Other specified counseling: Secondary | ICD-10-CM | POA: Diagnosis not present

## 2021-01-26 DIAGNOSIS — Z794 Long term (current) use of insulin: Secondary | ICD-10-CM

## 2021-01-26 DIAGNOSIS — L089 Local infection of the skin and subcutaneous tissue, unspecified: Secondary | ICD-10-CM | POA: Diagnosis not present

## 2021-01-26 DIAGNOSIS — E1122 Type 2 diabetes mellitus with diabetic chronic kidney disease: Secondary | ICD-10-CM | POA: Diagnosis present

## 2021-01-26 DIAGNOSIS — Z87891 Personal history of nicotine dependence: Secondary | ICD-10-CM

## 2021-01-26 DIAGNOSIS — J9621 Acute and chronic respiratory failure with hypoxia: Secondary | ICD-10-CM | POA: Diagnosis present

## 2021-01-26 DIAGNOSIS — K5730 Diverticulosis of large intestine without perforation or abscess without bleeding: Secondary | ICD-10-CM | POA: Diagnosis present

## 2021-01-26 DIAGNOSIS — A419 Sepsis, unspecified organism: Principal | ICD-10-CM

## 2021-01-26 DIAGNOSIS — E1129 Type 2 diabetes mellitus with other diabetic kidney complication: Secondary | ICD-10-CM | POA: Diagnosis present

## 2021-01-26 DIAGNOSIS — I10 Essential (primary) hypertension: Secondary | ICD-10-CM

## 2021-01-26 DIAGNOSIS — R778 Other specified abnormalities of plasma proteins: Secondary | ICD-10-CM | POA: Diagnosis not present

## 2021-01-26 DIAGNOSIS — L03317 Cellulitis of buttock: Secondary | ICD-10-CM | POA: Diagnosis present

## 2021-01-26 DIAGNOSIS — E871 Hypo-osmolality and hyponatremia: Secondary | ICD-10-CM

## 2021-01-26 DIAGNOSIS — Z7902 Long term (current) use of antithrombotics/antiplatelets: Secondary | ICD-10-CM

## 2021-01-26 DIAGNOSIS — I248 Other forms of acute ischemic heart disease: Secondary | ICD-10-CM | POA: Diagnosis present

## 2021-01-26 DIAGNOSIS — E7800 Pure hypercholesterolemia, unspecified: Secondary | ICD-10-CM | POA: Diagnosis present

## 2021-01-26 DIAGNOSIS — Z801 Family history of malignant neoplasm of trachea, bronchus and lung: Secondary | ICD-10-CM

## 2021-01-26 DIAGNOSIS — Z803 Family history of malignant neoplasm of breast: Secondary | ICD-10-CM

## 2021-01-26 DIAGNOSIS — Z515 Encounter for palliative care: Secondary | ICD-10-CM

## 2021-01-26 DIAGNOSIS — Z7951 Long term (current) use of inhaled steroids: Secondary | ICD-10-CM

## 2021-01-26 DIAGNOSIS — I639 Cerebral infarction, unspecified: Secondary | ICD-10-CM | POA: Diagnosis not present

## 2021-01-26 DIAGNOSIS — K219 Gastro-esophageal reflux disease without esophagitis: Secondary | ICD-10-CM | POA: Diagnosis present

## 2021-01-26 DIAGNOSIS — R0602 Shortness of breath: Secondary | ICD-10-CM

## 2021-01-26 DIAGNOSIS — Z955 Presence of coronary angioplasty implant and graft: Secondary | ICD-10-CM

## 2021-01-26 DIAGNOSIS — R6520 Severe sepsis without septic shock: Secondary | ICD-10-CM | POA: Diagnosis present

## 2021-01-26 DIAGNOSIS — E039 Hypothyroidism, unspecified: Secondary | ICD-10-CM | POA: Diagnosis present

## 2021-01-26 DIAGNOSIS — I447 Left bundle-branch block, unspecified: Secondary | ICD-10-CM | POA: Diagnosis present

## 2021-01-26 DIAGNOSIS — Z79899 Other long term (current) drug therapy: Secondary | ICD-10-CM

## 2021-01-26 DIAGNOSIS — I255 Ischemic cardiomyopathy: Secondary | ICD-10-CM | POA: Diagnosis present

## 2021-01-26 DIAGNOSIS — Z90710 Acquired absence of both cervix and uterus: Secondary | ICD-10-CM

## 2021-01-26 DIAGNOSIS — R7989 Other specified abnormal findings of blood chemistry: Secondary | ICD-10-CM | POA: Diagnosis present

## 2021-01-26 DIAGNOSIS — I2510 Atherosclerotic heart disease of native coronary artery without angina pectoris: Secondary | ICD-10-CM | POA: Diagnosis present

## 2021-01-26 LAB — BASIC METABOLIC PANEL
Anion gap: 15 (ref 5–15)
Anion gap: 14 (ref 5–15)
BUN: 35 mg/dL — ABNORMAL HIGH (ref 8–23)
BUN: 34 mg/dL — ABNORMAL HIGH (ref 8–23)
CO2: 24 mmol/L (ref 22–32)
CO2: 25 mmol/L (ref 22–32)
Calcium: 8.6 mg/dL — ABNORMAL LOW (ref 8.9–10.3)
Calcium: 8.4 mg/dL — ABNORMAL LOW (ref 8.9–10.3)
Chloride: 87 mmol/L — ABNORMAL LOW (ref 98–111)
Chloride: 88 mmol/L — ABNORMAL LOW (ref 98–111)
Creatinine, Ser: 1.59 mg/dL — ABNORMAL HIGH (ref 0.44–1.00)
Creatinine, Ser: 1.6 mg/dL — ABNORMAL HIGH (ref 0.44–1.00)
GFR, Estimated: 31 mL/min — ABNORMAL LOW (ref >60)
GFR, Estimated: 30 mL/min — ABNORMAL LOW (ref >60)
Glucose, Bld: 267 mg/dL — ABNORMAL HIGH (ref 70–99)
Glucose, Bld: 210 mg/dL — ABNORMAL HIGH (ref 70–99)
Potassium: 4.4 mmol/L (ref 3.5–5.1)
Potassium: 4.6 mmol/L (ref 3.5–5.1)
Sodium: 126 mmol/L — ABNORMAL LOW (ref 135–145)
Sodium: 127 mmol/L — ABNORMAL LOW (ref 135–145)

## 2021-01-26 LAB — BLOOD GAS, VENOUS
Acid-base deficit: 0.5 mmol/L (ref 0.0–2.0)
Bicarbonate: 24.2 mmol/L (ref 20.0–28.0)
O2 Saturation: 61.6 %
Patient temperature: 37
pCO2, Ven: 39 mmHg — ABNORMAL LOW (ref 44.0–60.0)
pH, Ven: 7.4 (ref 7.250–7.430)
pO2, Ven: 32 mmHg (ref 32.0–45.0)

## 2021-01-26 LAB — PROTIME-INR
INR: 1.3 — ABNORMAL HIGH (ref 0.8–1.2)
Prothrombin Time: 16.4 seconds — ABNORMAL HIGH (ref 11.4–15.2)

## 2021-01-26 LAB — RESP PANEL BY RT-PCR (FLU A&B, COVID) ARPGX2
Influenza A by PCR: NEGATIVE
Influenza B by PCR: NEGATIVE
SARS Coronavirus 2 by RT PCR: NEGATIVE

## 2021-01-26 LAB — CBC
HCT: 29 % — ABNORMAL LOW (ref 36.0–46.0)
Hemoglobin: 9 g/dL — ABNORMAL LOW (ref 12.0–15.0)
MCH: 24.7 pg — ABNORMAL LOW (ref 26.0–34.0)
MCHC: 31 g/dL (ref 30.0–36.0)
MCV: 79.5 fL — ABNORMAL LOW (ref 80.0–100.0)
Platelets: 436 10*3/uL — ABNORMAL HIGH (ref 150–400)
RBC: 3.65 MIL/uL — ABNORMAL LOW (ref 3.87–5.11)
RDW: 15.7 % — ABNORMAL HIGH (ref 11.5–15.5)
WBC: 32.5 10*3/uL — ABNORMAL HIGH (ref 4.0–10.5)
nRBC: 0 % (ref 0.0–0.2)

## 2021-01-26 LAB — CBG MONITORING, ED: Glucose-Capillary: 258 mg/dL — ABNORMAL HIGH (ref 70–99)

## 2021-01-26 LAB — GLUCOSE, CAPILLARY
Glucose-Capillary: 178 mg/dL — ABNORMAL HIGH (ref 70–99)
Glucose-Capillary: 168 mg/dL — ABNORMAL HIGH (ref 70–99)

## 2021-01-26 LAB — MAGNESIUM: Magnesium: 1.9 mg/dL (ref 1.7–2.4)

## 2021-01-26 LAB — BRAIN NATRIURETIC PEPTIDE: B Natriuretic Peptide: 916.4 pg/mL — ABNORMAL HIGH (ref 0.0–100.0)

## 2021-01-26 LAB — TROPONIN I (HIGH SENSITIVITY)
Troponin I (High Sensitivity): 222 ng/L (ref <18)
Troponin I (High Sensitivity): 658 ng/L (ref <18)

## 2021-01-26 LAB — OSMOLALITY: Osmolality: 283 mOsm/kg (ref 275–295)

## 2021-01-26 LAB — PROCALCITONIN: Procalcitonin: 2.8 ng/mL

## 2021-01-26 LAB — LACTIC ACID, PLASMA: Lactic Acid, Venous: 1.5 mmol/L (ref 0.5–1.9)

## 2021-01-26 MED ORDER — DIPHENOXYLATE-ATROPINE 2.5-0.025 MG PO TABS
1.0000 | ORAL_TABLET | Freq: Two times a day (BID) | ORAL | Status: DC | PRN
  Administered 2021-01-28: 1 via ORAL
  Filled 2021-01-26: qty 1

## 2021-01-26 MED ORDER — INSULIN ASPART 100 UNIT/ML IJ SOLN
0.0000 [IU] | Freq: Every day | INTRAMUSCULAR | Status: DC
  Administered 2021-01-28: 2 [IU] via SUBCUTANEOUS
  Filled 2021-01-26: qty 1

## 2021-01-26 MED ORDER — SODIUM CHLORIDE 1 G PO TABS
1.0000 g | ORAL_TABLET | Freq: Two times a day (BID) | ORAL | Status: DC
  Administered 2021-01-26 – 2021-01-27 (×2): 1 g via ORAL
  Filled 2021-01-26 (×4): qty 1

## 2021-01-26 MED ORDER — INSULIN ASPART 100 UNIT/ML IJ SOLN
0.0000 [IU] | Freq: Three times a day (TID) | INTRAMUSCULAR | Status: DC
  Administered 2021-01-26 – 2021-01-27 (×2): 5 [IU] via SUBCUTANEOUS
  Administered 2021-01-27 (×2): 2 [IU] via SUBCUTANEOUS
  Administered 2021-01-28: 3 [IU] via SUBCUTANEOUS
  Administered 2021-01-28: 5 [IU] via SUBCUTANEOUS
  Administered 2021-01-28 – 2021-01-29 (×2): 2 [IU] via SUBCUTANEOUS
  Filled 2021-01-26 (×7): qty 1

## 2021-01-26 MED ORDER — VANCOMYCIN HCL 750 MG/150ML IV SOLN
750.0000 mg | Freq: Once | INTRAVENOUS | Status: DC
  Filled 2021-01-26 (×2): qty 150

## 2021-01-26 MED ORDER — IPRATROPIUM BROMIDE 0.02 % IN SOLN
0.5000 mg | Freq: Four times a day (QID) | RESPIRATORY_TRACT | Status: DC
  Administered 2021-01-26 – 2021-01-29 (×11): 0.5 mg via RESPIRATORY_TRACT
  Filled 2021-01-26 (×11): qty 2.5

## 2021-01-26 MED ORDER — SODIUM CHLORIDE 0.9 % IV SOLN
2.0000 g | Freq: Once | INTRAVENOUS | Status: AC
  Administered 2021-01-26: 2 g via INTRAVENOUS
  Filled 2021-01-26: qty 2

## 2021-01-26 MED ORDER — INSULIN ISOPHANE & REGULAR (HUMAN 70-30)100 UNIT/ML KWIKPEN: 24.0000 [IU] | PEN_INJECTOR | Freq: Two times a day (BID) | SUBCUTANEOUS | Status: DC

## 2021-01-26 MED ORDER — FUROSEMIDE 10 MG/ML IJ SOLN
40.0000 mg | Freq: Two times a day (BID) | INTRAMUSCULAR | Status: DC
  Administered 2021-01-26: 40 mg via INTRAVENOUS
  Filled 2021-01-26: qty 4

## 2021-01-26 MED ORDER — NITROGLYCERIN 0.4 MG SL SUBL: 0.4000 mg | SUBLINGUAL_TABLET | SUBLINGUAL | Status: DC | PRN

## 2021-01-26 MED ORDER — LEVALBUTEROL HCL 0.63 MG/3ML IN NEBU
0.6300 mg | INHALATION_SOLUTION | Freq: Four times a day (QID) | RESPIRATORY_TRACT | Status: DC | PRN
  Administered 2021-01-26 – 2021-01-29 (×2): 0.63 mg via RESPIRATORY_TRACT
  Filled 2021-01-26 (×2): qty 3

## 2021-01-26 MED ORDER — VITAMIN D 25 MCG (1000 UNIT) PO TABS
5000.0000 [IU] | ORAL_TABLET | Freq: Every day | ORAL | Status: DC
  Administered 2021-01-27 – 2021-01-29 (×3): 5000 [IU] via ORAL
  Filled 2021-01-26 (×3): qty 5

## 2021-01-26 MED ORDER — GABAPENTIN 100 MG PO CAPS
100.0000 mg | ORAL_CAPSULE | Freq: Every day | ORAL | Status: DC
  Administered 2021-01-26 – 2021-01-28 (×3): 100 mg via ORAL
  Filled 2021-01-26 (×3): qty 1

## 2021-01-26 MED ORDER — CLOPIDOGREL BISULFATE 75 MG PO TABS
75.0000 mg | ORAL_TABLET | Freq: Every day | ORAL | Status: DC
  Administered 2021-01-27 – 2021-01-29 (×3): 75 mg via ORAL
  Filled 2021-01-26 (×3): qty 1

## 2021-01-26 MED ORDER — METOPROLOL SUCCINATE ER 100 MG PO TB24: 100.0000 mg | ORAL_TABLET | Freq: Every day | ORAL | Status: DC

## 2021-01-26 MED ORDER — ASPIRIN EC 81 MG PO TBEC
81.0000 mg | DELAYED_RELEASE_TABLET | Freq: Every day | ORAL | Status: DC
  Administered 2021-01-27 – 2021-01-29 (×3): 81 mg via ORAL
  Filled 2021-01-26 (×3): qty 1

## 2021-01-26 MED ORDER — VANCOMYCIN HCL IN DEXTROSE 750-5 MG/150ML-% IV SOLN
750.0000 mg | Freq: Once | INTRAVENOUS | Status: AC
  Administered 2021-01-26: 750 mg via INTRAVENOUS
  Filled 2021-01-26: qty 150

## 2021-01-26 MED ORDER — VANCOMYCIN HCL IN DEXTROSE 1-5 GM/200ML-% IV SOLN
1000.0000 mg | INTRAVENOUS | Status: DC
  Administered 2021-01-28: 1000 mg via INTRAVENOUS
  Filled 2021-01-26 (×2): qty 200

## 2021-01-26 MED ORDER — MOMETASONE FURO-FORMOTEROL FUM 200-5 MCG/ACT IN AERO
2.0000 | INHALATION_SPRAY | Freq: Two times a day (BID) | RESPIRATORY_TRACT | Status: DC
  Administered 2021-01-26 – 2021-01-30 (×7): 2 via RESPIRATORY_TRACT
  Filled 2021-01-26: qty 8.8

## 2021-01-26 MED ORDER — VANCOMYCIN HCL IN DEXTROSE 1-5 GM/200ML-% IV SOLN
1000.0000 mg | Freq: Once | INTRAVENOUS | Status: AC
  Administered 2021-01-26: 1000 mg via INTRAVENOUS
  Filled 2021-01-26: qty 200

## 2021-01-26 MED ORDER — ATORVASTATIN CALCIUM 80 MG PO TABS
80.0000 mg | ORAL_TABLET | Freq: Every day | ORAL | Status: DC
  Administered 2021-01-27 – 2021-01-29 (×3): 80 mg via ORAL
  Filled 2021-01-26 (×3): qty 1

## 2021-01-26 MED ORDER — DILTIAZEM HCL 25 MG/5ML IV SOLN
5.0000 mg | Freq: Once | INTRAVENOUS | Status: AC
  Administered 2021-01-27: 5 mg via INTRAVENOUS
  Filled 2021-01-26: qty 5

## 2021-01-26 MED ORDER — INSULIN ASPART PROT & ASPART (70-30 MIX) 100 UNIT/ML ~~LOC~~ SUSP
24.0000 [IU] | Freq: Two times a day (BID) | SUBCUTANEOUS | Status: DC
  Administered 2021-01-27 – 2021-01-29 (×4): 24 [IU] via SUBCUTANEOUS
  Filled 2021-01-26: qty 10

## 2021-01-26 MED ORDER — IPRATROPIUM BROMIDE 0.02 % IN SOLN
0.5000 mg | RESPIRATORY_TRACT | Status: DC
  Administered 2021-01-26: 0.5 mg via RESPIRATORY_TRACT
  Filled 2021-01-26: qty 2.5

## 2021-01-26 MED ORDER — ENOXAPARIN SODIUM 30 MG/0.3ML IJ SOSY
30.0000 mg | PREFILLED_SYRINGE | INTRAMUSCULAR | Status: DC
  Administered 2021-01-26 – 2021-01-28 (×3): 30 mg via SUBCUTANEOUS
  Filled 2021-01-26 (×3): qty 0.3

## 2021-01-26 MED ORDER — TRAZODONE HCL 50 MG PO TABS
50.0000 mg | ORAL_TABLET | Freq: Every evening | ORAL | Status: DC | PRN
  Administered 2021-01-27: 50 mg via ORAL
  Filled 2021-01-26 (×2): qty 1

## 2021-01-26 MED ORDER — MONTELUKAST SODIUM 10 MG PO TABS
10.0000 mg | ORAL_TABLET | Freq: Every day | ORAL | Status: DC
  Administered 2021-01-26 – 2021-01-28 (×3): 10 mg via ORAL
  Filled 2021-01-26 (×3): qty 1

## 2021-01-26 MED ORDER — VITAMIN B-12 1000 MCG PO TABS
1000.0000 ug | ORAL_TABLET | Freq: Every day | ORAL | Status: DC
  Administered 2021-01-27 – 2021-01-29 (×3): 1000 ug via ORAL
  Filled 2021-01-26 (×3): qty 1

## 2021-01-26 MED ORDER — DM-GUAIFENESIN ER 30-600 MG PO TB12: 1.0000 | ORAL_TABLET | Freq: Two times a day (BID) | ORAL | Status: DC | PRN

## 2021-01-26 MED ORDER — HYDRALAZINE HCL 20 MG/ML IJ SOLN: 5.0000 mg | INTRAMUSCULAR | Status: DC | PRN

## 2021-01-26 MED ORDER — ACETAMINOPHEN 325 MG PO TABS
650.0000 mg | ORAL_TABLET | Freq: Four times a day (QID) | ORAL | Status: DC | PRN
Start: 2021-01-26 — End: 2021-01-31
  Administered 2021-01-27 (×3): 650 mg via ORAL
  Filled 2021-01-26 (×3): qty 2

## 2021-01-26 MED ORDER — IOHEXOL 350 MG/ML SOLN
50.0000 mL | Freq: Once | INTRAVENOUS | Status: AC | PRN
  Administered 2021-01-26: 50 mL via INTRAVENOUS

## 2021-01-26 MED ORDER — LEVOTHYROXINE SODIUM 50 MCG PO TABS
50.0000 ug | ORAL_TABLET | Freq: Every day | ORAL | Status: DC
  Administered 2021-01-27 – 2021-01-30 (×4): 50 ug via ORAL
  Filled 2021-01-26 (×4): qty 1

## 2021-01-26 MED ORDER — HYDROXYZINE HCL 25 MG PO TABS
25.0000 mg | ORAL_TABLET | Freq: Four times a day (QID) | ORAL | Status: DC | PRN
  Administered 2021-01-28 – 2021-01-30 (×2): 25 mg via ORAL
  Filled 2021-01-26 (×2): qty 1

## 2021-01-26 MED ORDER — PANTOPRAZOLE SODIUM 40 MG PO TBEC
40.0000 mg | DELAYED_RELEASE_TABLET | Freq: Every day | ORAL | Status: DC
  Administered 2021-01-27 – 2021-01-30 (×4): 40 mg via ORAL
  Filled 2021-01-26 (×4): qty 1

## 2021-01-26 MED ORDER — METRONIDAZOLE 500 MG PO TABS
500.0000 mg | ORAL_TABLET | Freq: Three times a day (TID) | ORAL | Status: DC
  Administered 2021-01-26 – 2021-01-28 (×5): 500 mg via ORAL
  Filled 2021-01-26 (×7): qty 1

## 2021-01-26 NOTE — Consult Note (Signed)
[facility]  CARDIOLOGY CONSULT NOTE  Patient ID: Peggy [name] MRN: [id number] DOB/AGE: [birth year] 87 y.o.  Admit date: 01/26/2021 Referring Physician Lorretta HarpXilin Niu Primary Physician Lauro RegulusAnderson, Marshall W, MD Primary Cardiologist Jamse MeadAlex Paraschos Reason for Consultation Elevated troponin  HPI:  Peggy [name] is a 87 year old female with history of CAD (STEMI 07/02/2020 s/p DES to OM1, severe residual mid LAD disease), HFrEF (EF 35 to 40%), paroxysmal A. fib, hypertension, hyperlipidemia, GI bleeding on Xarelto and low-dose Eliquis, TIA 2018, COPD on home O2 who was admitted with shortness of breath and discovered to have sepsis from cellulitis of her left buttock.  Cardiology is consulted for her troponin elevation from 200-600.  She was recently discharged from an outside hospital 5 days ago for CHF exacerbation.  Her daughters provide history.  They tell me that she has not been herself for the last few weeks prior to going to the beach with her family, and while she was there she developed worsening shortness of breath prompting her presentation to the hospital.  While she was in the hospital she was given IV diuresis and overall improved.  Following her discharge she got sick over the next few days with worsening shortness of breath with exertion, frequent feelings of being warm or flushed, and shaking.  Yesterday evening and this morning she was confused and somewhat somnolent and thus she was brought to the emergency department for further evaluation.  On arrival to the emergency department she had a WBC 32.5, BNP 916, troponin 222 --> 658, CR 1.6 (baseline 1.0).  X-ray suggestive of worsening pulmonary edema.  EKG with left bundle branch block which is new compared to prior.  Exam is notable for cellulitis on her left buttock.  Review of systems complete and found to be negative unless listed above     Past Medical History:  Diagnosis Date   A-fib (HCC)    Allergy    Arthritis    COPD (chronic  obstructive pulmonary disease) (HCC)    Diabetes mellitus without complication (HCC)    GERD (gastroesophageal reflux disease)    Hyperlipidemia    Hypertension    Myocardial infarction (HCC)    Oxygen dependent    2 liters   Stroke (HCC)    5 years ago per daughter   TIA (transient ischemic attack)     Past Surgical History:  Procedure Laterality Date   ABDOMINAL HYSTERECTOMY  1970   BACK SURGERY  2013   colectomy Right 10/08/2013   Dr. Egbert GaribaldiBird   CORONARY ANGIOPLASTY WITH STENT PLACEMENT     CORONARY/GRAFT ACUTE MI REVASCULARIZATION N/A 07/02/2020   Procedure: Coronary/Graft Acute MI Revascularization;  Surgeon: Iran OuchArida, Muhammad A, MD;  Location: [facility] INVASIVE CV LAB;  Service: Cardiovascular;  Laterality: N/A;   LEFT HEART CATH AND CORONARY ANGIOGRAPHY N/A 07/02/2020   Procedure: LEFT HEART CATH AND CORONARY ANGIOGRAPHY;  Surgeon: Iran OuchArida, Muhammad A, MD;  Location: [facility] INVASIVE CV LAB;  Service: Cardiovascular;  Laterality: N/A;    Medications Prior to Admission  Medication Sig Dispense Refill Last Dose   budesonide-formoterol (SYMBICORT) 160-4.5 MCG/ACT inhaler Inhale 2 puffs into the lungs 2 (two) times daily. 1 each 3 01/25/2021   Cholecalciferol (VITAMIN D3) 5000 units TABS Take 5,000 Units by mouth daily.   01/25/2021   clopidogrel (PLAVIX) 75 MG tablet Take 1 tablet (75 mg total) by mouth daily with breakfast. 90 tablet 3 01/25/2021   esomeprazole (NEXIUM) 20 MG capsule Take 20 mg by mouth 2 (two) times daily.  01/25/2021   furosemide (LASIX) 20 MG tablet Take 20 mg by mouth.   01/25/2021   gabapentin (NEURONTIN) 100 MG capsule Take 100 mg by mouth at bedtime.    01/25/2021   HUMULIN 70/30 KWIKPEN (70-30) 100 UNIT/ML KwikPen Inject 35 Units into the skin 2 (two) times daily. (Patient taking differently: Inject 36 Units into the skin 2 (two) times daily.)   01/25/2021   hydrOXYzine (ATARAX/VISTARIL) 25 MG tablet Take 25 mg by mouth 4 (four) times daily as needed for itching.  5 01/25/2021    ipratropium-albuterol (DUONEB) 0.5-2.5 (3) MG/3ML SOLN Take 3 mLs by nebulization every 6 (six) hours as needed (SOB).   01/25/2021   isosorbide mononitrate (IMDUR) 30 MG 24 hr tablet Take 90 mg by mouth daily.   01/25/2021   levothyroxine (SYNTHROID) 50 MCG tablet Take 50 mcg by mouth daily before breakfast.   01/25/2021   metoprolol succinate (TOPROL-XL) 100 MG 24 hr tablet Take 1 tablet (100 mg total) by mouth daily. Take with or immediately following a meal. 30 tablet 0 01/25/2021   montelukast (SINGULAIR) 10 MG tablet Take 1 tablet (10 mg total) by mouth at bedtime. 30 tablet 3 01/25/2021   traZODone (DESYREL) 50 MG tablet Take 1 tablet every night for sleep 90 tablet 1 01/25/2021   vitamin B-12 (CYANOCOBALAMIN) 1000 MCG tablet Take 1,000 mcg by mouth daily.   01/25/2021   acetaminophen (TYLENOL) 500 MG tablet Take 500-1,000 mg by mouth every 6 (six) hours as needed for mild pain or moderate pain.   unknown at prn   acetaminophen-codeine (TYLENOL #3) 300-30 MG tablet Take one tab po bid as needed 15 tablet 0 unknown at prn   ALPRAZolam (XANAX) 0.25 MG tablet TAKE ONE TAB PO QHS FOR SLEEP (Patient not taking: No sig reported) 30 tablet 3 Not Taking   amiodarone (PACERONE) 200 MG tablet Take 1 tablet (200 mg total) by mouth 2 (two) times daily. (Patient not taking: No sig reported) 60 tablet 1 Not Taking   atorvastatin (LIPITOR) 80 MG tablet Take 1 tablet (80 mg total) by mouth daily. (Patient not taking: No sig reported) 90 tablet 1 Not Taking   chlorpheniramine-HYDROcodone (TUSSIONEX PENNKINETIC ER) 10-8 MG/5ML SUER Take 5 cc at bedtime at bed time (Patient not taking: No sig reported) 140 mL 0 Not Taking   diphenoxylate-atropine (LOMOTIL) 2.5-0.025 MG tablet Take 1 tablet by mouth 2 (two) times daily as needed for diarrhea or loose stools.   unknown at prn   doxycycline (VIBRA-TABS) 100 MG tablet Take 1 tablet (100 mg total) by mouth 2 (two) times daily. (Patient not taking: No sig reported) 20  tablet 0 Not Taking   irbesartan (AVAPRO) 150 MG tablet Take 1 tablet (150 mg total) by mouth daily. (Patient not taking: No sig reported) 90 tablet 0 Not Taking   nitroGLYCERIN (NITROSTAT) 0.4 MG SL tablet Place under the tongue.   unknown at prn   predniSONE (DELTASONE) 5 MG tablet Take 4 tablets (20 mg total) by mouth daily with breakfast. Take 4 tablet daily for 1 week, then Take 3 tablets daily for 2 days, then Take 2 tablets daily 2 days, thenTake 1 tablet daily (Patient not taking: No sig reported) 100 tablet 0 Not Taking   trospium (SANCTURA) 20 MG tablet Take 1 tablet (20 mg total) by mouth daily. (Patient not taking: Reported on 01/26/2021)   Not Taking   VENTOLIN HFA 108 (90 Base) MCG/ACT inhaler Inhale 2 puffs into the lungs every 6 (  six) hours as needed for wheezing or shortness of breath.   1 unknown at prn   Social History   Socioeconomic History   Marital status: Widowed    Spouse name: Not on file   Number of children: 3   Years of education: H/S   Highest education level: Not on file  Occupational History   Occupation: Retired  Tobacco Use   Smoking status: Former    Packs/day: 1.00    Years: 30.00    Pack years: 30.00    Types: Cigarettes    Quit date: 11/30/1999    Years since quitting: 21.1   Smokeless tobacco: Never  Vaping Use   Vaping Use: Never used  Substance and Sexual Activity   Alcohol use: No   Drug use: No   Sexual activity: Not on file  Other Topics Concern   Not on file  Social History Narrative   Not on file   Social Determinants of Health   Financial Resource Strain: Not on file  Food Insecurity: Not on file  Transportation Needs: Not on file  Physical Activity: Not on file  Stress: Not on file  Social Connections: Not on file  Intimate Partner Violence: Not on file    Family History  Problem Relation Age of Onset   Breast cancer Mother    Pancreatitis Father    Breast cancer Sister    Lung cancer Brother    Melanoma Brother     Throat cancer Brother       Review of systems complete and found to be negative unless listed above      PHYSICAL EXAM  General: Elderly female. Laying in bed. HEENT:  Normocephalic and atramatic Neck:  No visible JVD. Lungs: Increased work of breathing.  Anterior lung fields without significant crackles.  BiPAP in place Heart: Tachycardic but regular at the time of my examination.  Distant heart sounds. Abdomen: Bowel sounds are positive, abdomen soft and non-tender  Msk: No apparent deformities Extremities: Trace to 1+ bilateral lower extremity edema.  She has mottling of both lower extremities from the knee down.  Mottling of her hands as well. Neuro: Somnolent.  Opens eyes to voice but does not respond. Psych: As above.  Somnolent.  Labs:   Lab Results  Component Value Date   WBC 32.5 (H) 01/26/2021   HGB 9.0 (L) 01/26/2021   HCT 29.0 (L) 01/26/2021   MCV 79.5 (L) 01/26/2021   PLT 436 (H) 01/26/2021    Recent Labs  Lab 01/26/21 1544  NA 127*  K 4.6  CL 88*  CO2 25  BUN 34*  CREATININE 1.60*  CALCIUM 8.4*  GLUCOSE 210*   Lab Results  Component Value Date   CKTOTAL 130 12/10/2016   CKMB 3.4 10/06/2013   TROPONINI 0.52 (HH) 11/22/2018    Lab Results  Component Value Date   CHOL 189 04/15/2020   CHOL 216 (H) 03/07/2019   CHOL 175 12/10/2016   Lab Results  Component Value Date   HDL 35 (L) 04/15/2020   HDL 33 (L) 03/07/2019   HDL 29 (L) 12/10/2016   Lab Results  Component Value Date   LDLCALC 91 04/15/2020   LDLCALC 110 (H) 03/07/2019   LDLCALC 69 12/10/2016   Lab Results  Component Value Date   TRIG 314 (H) 04/15/2020   TRIG 365 (H) 03/07/2019   TRIG 387 (H) 12/10/2016   Lab Results  Component Value Date   CHOLHDL 5.4 04/15/2020   CHOLHDL 6.5 03/07/2019  CHOLHDL 6.0 12/10/2016   No results found for: LDLDIRECT    Radiology: CT PELVIS W CONTRAST  Result Date: 01/26/2021 CLINICAL DATA:  Concern for perirectal abscess EXAM: CT PELVIS  WITH CONTRAST TECHNIQUE: Multidetector CT imaging of the pelvis was performed using the standard protocol following the bolus administration of intravenous contrast. CONTRAST:  33mL OMNIPAQUE IOHEXOL 350 MG/ML SOLN COMPARISON:  Prior CT of the abdomen and pelvis 10/08/2013 FINDINGS: Urinary Tract:  No abnormality visualized. Bowel: Colonic diverticular disease without CT evidence of active inflammation. Incompletely imaged surgical changes of prior right hemicolectomy. Vascular/Lymphatic: Atherosclerotic calcifications noted circumferentially around the distal aorta. No evidence of aneurysm. No suspicious lymphadenopathy. Reproductive: Surgical changes of prior hysterectomy. No mass or other significant abnormality Other: Diffuse interstitial stranding throughout the subcutaneous fat of the right inferior buttock. There is also associated mild skin thickening. No discrete fluid collection to suggest the presence of abscess. Musculoskeletal: No acute fracture or malalignment. Lower lumbar degenerative disc disease. IMPRESSION: Right buttock cellulitis without defined abscess. Colonic diverticular disease without CT evidence of active inflammation. Aortic Atherosclerosis (ICD10-I70.0). Electronically Signed   By: Malachy Moan M.D.   On: 01/26/2021 14:54   DG Chest Portable 1 View  Result Date: 01/26/2021 CLINICAL DATA:  Shortness of breath. EXAM: PORTABLE CHEST 1 VIEW COMPARISON:  April 14, 2020. FINDINGS: Stable cardiomediastinal silhouette. Increased bilateral perihilar and basilar interstitial densities are noted concerning for pulmonary edema. No pneumothorax is noted. Bony thorax is unremarkable. IMPRESSION: Increased bilateral lung opacities are noted concerning for worsening pulmonary edema. Aortic Atherosclerosis (ICD10-I70.0). Electronically Signed   By: Lupita Raider M.D.   On: 01/26/2021 10:25    EKG: Probably sinus tachycardia.  Left bundle branch block.  ASSESSMENT AND PLAN:  Peggy [name] is  a 87 year old female with history of CAD (STEMI 07/02/2020 s/p DES to OM1, severe residual mid LAD disease), HFrEF (EF 35 to 40%), paroxysmal A. fib, hypertension, hyperlipidemia, GI bleeding on Xarelto and low-dose Eliquis, TIA 2018, COPD on home O2 who was admitted with shortness of breath and discovered to have sepsis from cellulitis of her left buttock.  Cardiology is consulted for her troponin elevation from 200-600.  #Demand ischemia from sepsis #Type II myocardial injury The patient is admitted with sepsis from a left buttock cellulitis as well as heart failure exacerbation which is likely driven by this infection.  She is not complaining of any chest pain.  Notably she has severe residual coronary artery disease including a 90% mid LAD lesion.  In this setting, it is not surprising that she would have an elevated troponin in the setting of systemic infection and heart failure exacerbation. - Continue plavix and aspirin 81 mg - No indication for heparin at this time - Hold beta blocker in the setting of infection and boderline BP.  #Severe sepsis due to left buttock cellulitis Patient has multiorgan dysfunction including AKI, altered mental status, shortness of breath in the setting of a left buttock cellulitis.  Her treatment is complicated by the fact that she has chronic systolic heart failure. -Antibiotics per primary team -Patient is extremely high risk for decompensation.  # Chronic hypoxic respiratory failure d/t COPD and HF # Acute on chronic systolic heart failure She is presenting with shortness of breath and sepsis, though on exam she does not appear overtly volume overloaded.  Her daughter also does not feel that she has been swelling over the last couple days prior to presentation.  Her proBNP is elevated but this is nonspecific  in the setting of chronic heart failure, AKI, and sepsis.  At this time we should concentrate primarily on treatment of her sepsis rather than heart  failure. -Hold metoprolol and ibrisartan -She is received 2 doses of Lasix today.  Lets see how she does with this.  Ultimately she may need some volume resuscitation in the setting of her sepsis but we will hold for now. -Repeat echocardiogram tomorrow.  # Paroxysmal AF - Prior bleeding with anticoagulation -Previously on amiodarone but stopped due to a adverse reaction.  May need to clarify this reaction as I think amiodarone will be helpful in the setting of her sepsis to prevent recurrence of A. fib.  We will hold for now.   Signed: Armando Reichert MD 01/26/2021, 4:47 PM

## 2021-01-26 NOTE — Progress Notes (Signed)
Patient transported to CT and then to room 239 with RN while on BIPAP. Patient tolerated well.

## 2021-01-26 NOTE — ED Notes (Signed)
purewick applied.

## 2021-01-26 NOTE — Progress Notes (Signed)
Pharmacy Antibiotic Note  Peggy [name] is a 87 y.o. female admitted on 01/26/2021. Pharmacy has been consulted for vancomycin dosing for cellulitis.  Plan: Vancomycin 1750 mg IV load followed by 1000 mg IV q48hr  Goal AUC 400-550 Expected AUC: 435 SCr used: 1.59   Height: 5' (152.4 cm) Weight: 86.6 kg (191 lb) IBW/kg (Calculated) : 45.5  Temp (24hrs), Avg:98.1 F (36.7 C), Min:98.1 F (36.7 C), Max:98.1 F (36.7 C)  Recent Labs  Lab 01/26/21 1001  WBC 32.5*  CREATININE 1.59*    Estimated Creatinine Clearance: 23 mL/min (A) (by C-G formula based on SCr of 1.59 mg/dL (H)).    Allergies  Allergen Reactions   Diphenhydramine Rash    AGITATION/DELIRIUM   Metformin Diarrhea   Oxybutynin Other (See Comments)    dizzy   Amlodipine Rash   Ciprofloxacin Rash   Lisinopril Rash   Penicillins Rash    Family states was a "long time ago"   Saxagliptin Rash   Sulfamethoxazole-Trimethoprim Rash    Antimicrobials this admission: Vancomycin 8/27 >> Metronidazole 8/27 >>   Microbiology results: 8/27 BCx: pending   Thank you for allowing pharmacy to be a part of this patient's care.  Pricilla Riffle, PharmD, BCPS Clinical Pharmacist 01/26/2021 12:38 PM

## 2021-01-26 NOTE — H&P (Addendum)
History and Physical    Peggy [name] FTD:[id number] DOB: 18-May-1929 DOA: 01/26/2021  Referring MD/NP/PA:   PCP: Kirk Ruths, MD   Patient coming from:  The patient is coming from home.           Chief Complaint: SOB  HPI: Peggy [name] is a 87 y.o. female with medical history significant of hypertension, hyperlipidemia, diabetes mellitus, COPD on 2 L oxygen, stroke, TIA, GERD, hypothyroidism, anxiety, CAD, myocardial infarction, atrial fibrillation not on anticoagulants, GI bleeding, CHF with EF of 35-40%, CKD-3A, who presents with shortness of breath.  Daughter states that patient was recently hospitalized due to CHF exacerbation.  She was discharged from another hospital 5 days ago.  She developed shortness of breath again after she went home, which has been progressively worsening.  She has dry cough, no chest pain.  Patient has loose stool, no active diarrhea, nausea, vomiting or abdominal pain.  No symptoms of UTI.  Patient has erythema and tenderness in the left buttock area.  Patient had fever of 101 last night, no chills.  Today temperature 98.1.  Patient was found to have acute respiratory distress, oxygen saturation 84% on home level of 2 L oxygen, BiPAP started in ED.  ED Course: pt was found to have WBC 32.5, BMP 916, troponin level 222, negative COVID PCR, worsening renal function, sodium 126, potassium 4.4, temperature normal, blood pressure 97/65, 122/47, heart rate 118, RR 36.  VBG with pH 7.4, CO2 39, O2 32. Chest x-ray showed bilateral basilar pulmonary edema.  Patient is admitted to progressive bed as inpatient.  Review of Systems:   General: no fevers, chills, no body weight gain, has fatigue HEENT: no blurry vision, hearing changes or sore throat Respiratory: has dyspnea, coughing,  no wheezing CV: no chest pain, no palpitations GI: no nausea, vomiting, abdominal pain, diarrhea, constipation GU: no dysuria, burning on urination, increased urinary frequency,  hematuria  Ext: has leg edema Neuro: no unilateral weakness, numbness, or tingling, no vision change or hearing loss Skin: no rash, no skin tear. Has erythema and tenderness in the left buttock area MSK: No muscle spasm, no deformity, no limitation of range of movement in spin Heme: No easy bruising.  Travel history: No recent long distant travel.  Allergy:  Allergies  Allergen Reactions   Diphenhydramine Rash    AGITATION/DELIRIUM   Metformin Diarrhea   Oxybutynin Other (See Comments)    dizzy   Amlodipine Rash   Ciprofloxacin Rash   Lisinopril Rash   Penicillins Rash    Family states was a "long time ago"   Saxagliptin Rash   Sulfamethoxazole-Trimethoprim Rash    Past Medical History:  Diagnosis Date   A-fib ([place])    Allergy    Arthritis    COPD (chronic obstructive pulmonary disease) ([place])    Diabetes mellitus without complication (HCC)    GERD (gastroesophageal reflux disease)    Hyperlipidemia    Hypertension    Myocardial infarction ([place])    Oxygen dependent    2 liters   Stroke ([place])    5 years ago per daughter   TIA (transient ischemic attack)     Past Surgical History:  Procedure Laterality Date   [place] SURGERY  2013   colectomy Right 10/08/2013   Dr. Marina Gravel   CORONARY ANGIOPLASTY WITH STENT PLACEMENT     CORONARY/GRAFT ACUTE MI REVASCULARIZATION N/A 07/02/2020   Procedure: Coronary/Graft Acute MI Revascularization;  Surgeon: Kathlyn Sacramento  A, MD;  Location: [facility];  Service: Cardiovascular;  Laterality: N/A;   LEFT HEART CATH AND CORONARY ANGIOGRAPHY N/A 07/02/2020   Procedure: LEFT HEART CATH AND CORONARY ANGIOGRAPHY;  Surgeon: Wellington Hampshire, MD;  Location: [facility];  Service: Cardiovascular;  Laterality: N/A;    Social History:  reports that she quit smoking about 21 years ago. She has a 30.00 pack-year smoking history. She has never used smokeless tobacco. She reports that she does not drink  alcohol and does not use drugs.  Family History:  Family History  Problem Relation Age of Onset   Breast cancer Mother    Pancreatitis Father    Breast cancer Sister    Lung cancer Brother    Melanoma Brother    Throat cancer Brother      Prior to Admission medications   Medication Sig Start Date End Date Taking? Authorizing Provider  acetaminophen (TYLENOL) 500 MG tablet Take 500-1,000 mg by mouth every 6 (six) hours as needed for mild pain or moderate pain.    [provider]  acetaminophen-codeine (TYLENOL #3) 300-30 MG tablet Take one tab po bid as needed 12/13/20   Lavera Guise, MD  ALPRAZolam Duanne Moron) 0.25 MG tablet TAKE ONE TAB PO QHS FOR SLEEP Patient not taking: No sig reported 04/12/20   Lavera Guise, MD  amiodarone (PACERONE) 200 MG tablet Take 1 tablet (200 mg total) by mouth 2 (two) times daily. 07/04/20   Lorella Nimrod, MD  atorvastatin (LIPITOR) 80 MG tablet Take 1 tablet (80 mg total) by mouth daily. Patient not taking: Reported on 12/13/2020 07/05/20   Lorella Nimrod, MD  budesonide-formoterol [facility]) 160-4.5 MCG/ACT inhaler Inhale 2 puffs into the lungs 2 (two) times daily. 09/12/20   Lavera Guise, MD  chlorpheniramine-HYDROcodone [facility]) 10-8 MG/5ML SUER Take 5 cc at bedtime at bed time 09/18/20   Lavera Guise, MD  Cholecalciferol (VITAMIN D3) 5000 units TABS Take 5,000 Units by mouth daily.    [provider]  clopidogrel (PLAVIX) 75 MG tablet Take 1 tablet (75 mg total) by mouth daily with breakfast. 07/05/20   Lorella Nimrod, MD  diphenoxylate-atropine (LOMOTIL) 2.5-0.025 MG tablet Take 1 tablet by mouth 2 (two) times daily as needed for diarrhea or loose stools. 06/06/20   [provider]  doxycycline (VIBRA-TABS) 100 MG tablet Take 1 tablet (100 mg total) by mouth 2 (two) times daily. 12/12/20   McDonough, Si Gaul, PA-C  esomeprazole (NEXIUM) 20 MG capsule Take 20 mg by mouth 2 (two) times daily. 04/21/20   [provider]  gabapentin (NEURONTIN) 100 MG capsule Take 100 mg by mouth at bedtime.  11/23/19   [provider]  HUMULIN 70/30 KWIKPEN (70-30) 100 UNIT/ML KwikPen Inject 35 Units into the skin 2 (two) times daily. Patient taking differently: Inject 32 Units into the skin 2 (two) times daily. 04/01/20   Jennye Boroughs, MD  hydrOXYzine (ATARAX/VISTARIL) 25 MG tablet Take 25 mg by mouth 4 (four) times daily as needed for itching. 06/05/17   [provider]  ipratropium-albuterol (DUONEB) 0.5-2.5 (3) MG/3ML SOLN Take 3 mLs by nebulization every 6 (six) hours as needed (SOB).    [provider]  irbesartan (AVAPRO) 150 MG tablet Take 1 tablet (150 mg total) by mouth daily. 07/05/20   Lorella Nimrod, MD  isosorbide mononitrate (IMDUR) 30 MG 24 hr tablet Take 30 mg by mouth daily. 11/09/19   [provider]  levothyroxine (SYNTHROID)  50 MCG tablet Take 50 mcg by mouth daily before breakfast.    [provider]  metoprolol succinate (TOPROL-XL) 100 MG 24 hr tablet Take 1 tablet (100 mg total) by mouth daily. Take with or immediately following a meal. 11/25/18   Lang Snow, NP  montelukast (SINGULAIR) 10 MG tablet Take 1 tablet (10 mg total) by mouth at bedtime. 12/13/20   Lavera Guise, MD  nitroGLYCERIN (NITROSTAT) 0.4 MG SL tablet Place under the tongue. 11/12/20 11/12/21  [provider]  predniSONE (DELTASONE) 5 MG tablet Take 4 tablets (20 mg total) by mouth daily with breakfast. Take 4 tablet daily for 1 week, then Take 3 tablets daily for 2 days, then Take 2 tablets daily 2 days, thenTake 1 tablet daily 10/04/20   McDonough, Si Gaul, PA-C  traZODone (DESYREL) 50 MG tablet Take 1 tablet every night for sleep 08/16/20   Lavera Guise, MD  trospium (SANCTURA) 20 MG tablet Take 1 tablet (20 mg total) by mouth daily. 04/01/20   Jennye Boroughs, MD  VENTOLIN HFA 108 (90 Base) MCG/ACT inhaler Inhale 2 puffs into the lungs every 6 (six) hours as needed for  wheezing or shortness of breath.     [provider]  vitamin B-12 (CYANOCOBALAMIN) 1000 MCG tablet Take 1,000 mcg by mouth daily.    [provider]    Physical Exam: Vitals:   01/26/21 1200 01/26/21 1230 01/26/21 1300 01/26/21 1330  BP: 107/73 128/65 (!) 131/58 (!) 126/58  Pulse: (!) 105 99 96 95  Resp: (!) 27 (!) 25 (!) 21 (!) 23  Temp:      TempSrc:      SpO2: 100% 99% 99% 99%  Weight:      Height:       General:  in acute respiratory distress HEENT:       Eyes: PERRL, EOMI, no scleral icterus.       ENT: No discharge from the ears and nose, no pharynx injection, no tonsillar enlargement.        Neck: positive JVD, no bruit, no mass felt. Heme: No neck lymph node enlargement. Cardiac: S1/S2, RRR, No murmurs, No gallops or rubs. Respiratory: has crackles bilaterally, no wheezing or rhonchi GI: Soft, nondistended, nontender, no rebound pain, no organomegaly, BS present. GU: No hematuria Ext: 1+ pitting leg edema bilaterally. 1+DP/PT pulse bilaterally. Musculoskeletal: No joint deformities, No joint redness or warmth, no limitation of ROM in spin. Skin: has erythema, warmth, tenderness and induration in left buttock area.     Neuro: Alert, oriented X3, cranial nerves II-XII grossly intact, moves all extremities normally.  Psych: Patient is not psychotic, no suicidal or hemocidal ideation.  Labs on Admission: I have personally reviewed following labs and imaging studies  CBC: Recent Labs  Lab 01/26/21 1001  WBC 32.5*  HGB 9.0*  HCT 29.0*  MCV 79.5*  PLT 850*   Basic Metabolic Panel: Recent Labs  Lab 01/26/21 1001  NA 126*  K 4.4  CL 87*  CO2 24  GLUCOSE 267*  BUN 35*  CREATININE 1.59*  CALCIUM 8.6*  MG 1.9   GFR: Estimated Creatinine Clearance: 23 mL/min (A) (by C-G formula based on SCr of 1.59 mg/dL (H)). Liver Function Tests: No results for input(s): AST, ALT, ALKPHOS, BILITOT, PROT, ALBUMIN in the last 168 hours. No results for  input(s): LIPASE, AMYLASE in the last 168 hours. No results for input(s): AMMONIA in the last 168 hours. Coagulation Profile: Recent Labs  Lab 01/26/21  1001  INR 1.3*   Cardiac Enzymes: No results for input(s): CKTOTAL, CKMB, CKMBINDEX, TROPONINI in the last 168 hours. BNP (last 3 results) No results for input(s): PROBNP in the last 8760 hours. HbA1C: No results for input(s): HGBA1C in the last 72 hours. CBG: Recent Labs  Lab 01/26/21 1239  GLUCAP 258*   Lipid Profile: No results for input(s): CHOL, HDL, LDLCALC, TRIG, CHOLHDL, LDLDIRECT in the last 72 hours. Thyroid Function Tests: No results for input(s): TSH, T4TOTAL, FREET4, T3FREE, THYROIDAB in the last 72 hours. Anemia Panel: No results for input(s): VITAMINB12, FOLATE, FERRITIN, TIBC, IRON, RETICCTPCT in the last 72 hours. Urine analysis:    Component Value Date/Time   COLORURINE STRAW (A) 03/30/2020 2030   APPEARANCEUR CLEAR (A) 03/30/2020 2030   APPEARANCEUR [place] 08/11/2012 1201   LABSPEC 1.011 03/30/2020 2030   LABSPEC 1.020 08/11/2012 1201   PHURINE 6.0 03/30/2020 2030   GLUCOSEU >=500 (A) 03/30/2020 2030   GLUCOSEU Negative 08/11/2012 1201   HGBUR NEGATIVE 03/30/2020 2030   [place] NEGATIVE 03/30/2020 2030   BILIRUBINUR neg 02/11/2015 0950   BILIRUBINUR Negative 08/11/2012 1201   [place] 03/30/2020 2030   PROTEINUR NEGATIVE 03/30/2020 2030   UROBILINOGEN 0.2 02/11/2015 0950   NITRITE NEGATIVE 03/30/2020 2030   LEUKOCYTESUR NEGATIVE 03/30/2020 2030   LEUKOCYTESUR 1+ 08/11/2012 1201   Sepsis Labs: @LABRCNTIP (procalcitonin:4,lacticidven:4) ) Recent Results (from the past 240 hour(s))  Resp Panel by RT-PCR (Flu A&B, Covid) Nasopharyngeal Swab     Status: None   Collection Time: 01/26/21 10:01 AM   Specimen: Nasopharyngeal Swab; Nasopharyngeal(NP) swabs in vial transport medium  Result Value Ref Range Status   SARS Coronavirus 2 by RT PCR NEGATIVE NEGATIVE Final    Comment:  (NOTE) SARS-CoV-2 target nucleic acids are NOT DETECTED.  The SARS-CoV-2 RNA is generally detectable in upper respiratory specimens during the acute phase of infection. The lowest concentration of SARS-CoV-2 viral copies this assay can detect is 138 copies/mL. A negative result does not preclude SARS-Cov-2 infection and should not be used as the sole basis for treatment or other patient management decisions. A negative result may occur with  improper specimen collection/handling, submission of specimen other than nasopharyngeal swab, presence of viral mutation(s) within the areas targeted by this assay, and inadequate number of viral copies(<138 copies/mL). A negative result must be combined with clinical observations, patient history, and epidemiological information. The expected result is Negative.  Fact Sheet for Patients:  EntrepreneurPulse.com.au  Fact Sheet for Healthcare Providers:  IncredibleEmployment.be  This test is no t yet approved or cleared by the [place] FDA and  has been authorized for detection and/or diagnosis of SARS-CoV-2 by FDA under an Emergency Use Authorization (EUA). This EUA will remain  in effect (meaning this test can be used) for the duration of the COVID-19 declaration under Section 564(b)(1) of the Act, 21 U.S.C.section 360bbb-3(b)(1), unless the authorization is terminated  or revoked sooner.       Influenza A by PCR NEGATIVE NEGATIVE Final   Influenza B by PCR NEGATIVE NEGATIVE Final    Comment: (NOTE) The Xpert Xpress SARS-CoV-2/FLU/RSV plus assay is intended as an aid in the diagnosis of influenza from Nasopharyngeal swab specimens and should not be used as a sole basis for treatment. Nasal washings and aspirates are unacceptable for Xpert Xpress SARS-CoV-2/FLU/RSV testing.  Fact Sheet for Patients: EntrepreneurPulse.com.au  Fact Sheet for Healthcare  Providers: IncredibleEmployment.be  This test is not yet approved or cleared by the [place] FDA and has  been authorized for detection and/or diagnosis of SARS-CoV-2 by FDA under an Emergency Use Authorization (EUA). This EUA will remain in effect (meaning this test can be used) for the duration of the COVID-19 declaration under Section 564(b)(1) of the Act, 21 U.S.C. section 360bbb-3(b)(1), unless the authorization is terminated or revoked.  Performed at [facility], [address], [place], [place] [address]      Radiological Exams on Admission: DG Chest Portable 1 View  Result Date: 01/26/2021 CLINICAL DATA:  Shortness of breath. EXAM: PORTABLE CHEST 1 VIEW COMPARISON:  April 14, 2020. FINDINGS: Stable cardiomediastinal silhouette. Increased bilateral perihilar and basilar interstitial densities are noted concerning for pulmonary edema. No pneumothorax is noted. Bony thorax is unremarkable. IMPRESSION: Increased bilateral lung opacities are noted concerning for worsening pulmonary edema. Aortic Atherosclerosis (ICD10-I70.0). Electronically Signed   By: Marijo Conception M.D.   On: 01/26/2021 10:25     EKG: I have personally reviewed.  Seems to be junctional rhythm, QTC 587, left bundle blockage, LAD, poor R wave progression  Assessment/Plan Principal Problem:   Acute on chronic respiratory failure with hypoxia (HCC) Active Problems:   Adult hypothyroidism   Essential (primary) hypertension   Hypercholesteremia   CAD (coronary artery disease)   AF (paroxysmal atrial fibrillation) (HCC)   CVA (cerebral vascular accident) ([place])   COPD (chronic obstructive pulmonary disease) (HCC)   Acute on chronic systolic CHF (congestive heart failure) (HCC)   Elevated troponin   Type II diabetes mellitus with renal manifestations (HCC)   Acute renal failure superimposed on stage 3a chronic kidney disease (HCC)   Hyponatremia   Sepsis (HCC)   Cellulitis of  left buttock   Acute on chronic respiratory failure with hypoxia due to acute on chronic systolic CHF: 2D echo on 09/18/6220 showed EF 45-45%%.  Patient has 1+ leg edema, positive JVD, fine crackles on auscultation, elevated BNP 916, clinically consistent with CHF exacerbation.  It is a difficult situation, since patient has hyponatremia with sodium 126, fortunately patient's mental status is normal.  Will start IV Lasix and also sodium chloride tablets, monitor sodium level closely  -Will admit to progressive unit as inpatient -BiPAP -Lasix 40 mg BID IV -Daily weights -strict I/O's -Low salt diet -Fluid restriction -Obtain REDs Vest reading  Addendum: per Dr. Corky Sox of card, due to risk of developing hypotension, will need hold off lasix (pt received 1 dose of IV Lasix) and metoprolol.  Started aspirin 81 mg per Dr. Corky Sox  Elevated troponin: trop 222. No CP, likely demand ischemia -Lipitor, Plavix -Hold Imdur due to softer blood pressure -Trend troponin -Check A1c, FLP  Sepsis due to cellulitis of left buttock: Patient meets criteria for sepsis with leukocytosis with WBC 32.5, tachycardia with heart rate of 118, tachypnea with RR 36.  Pending lactic acid level.  Hemodynamically stable and sepsis soft. - Empiric antimicrobial treatment with vancomycin and Flagyl (patient received 1 dose of cefepime in ED) - Blood cultures x 2  - ESR and CRP - will get Procalcitonin and trend lactic acid levels per sepsis protocol. - f/u CT-pelvis to r/o abscess  Hyponatremia: Na 126.  Mental status normal, likely multifactorial etiology, including poor oral intake, Lasix use, irbesartan use - Will check urine sodium, urine osmolality, serum osmolality. - Fluid restriction - f/u by BMP q8h - Sodium bicarbonate tablet 1 g twice daily  Essential (primary) hypertension: -IV hydralazine as needed -Patient is on IV Lasix -Hold irbesartan due to hyponatremia and worsening renal  function -Metoprolol  Hypercholesteremia: pt is not taking lipitor -restart lipitor due to elevated trop  CAD (coronary artery disease) and elevated troponin: Troponin level 222 --> 658, no chest pain.  Likely due to demand ischemia -hold Imdur due to softer blood pressure -Trend troponin -Continue Plavix -As needed nitroglycerin -Check A1c, FLP -consulted Dr. Corky Sox of card  AF (paroxysmal atrial fibrillation) [facility]): Patient is not taking anticoagulants, possibly due to history of GI bleeding -Metoprolol  CVA (cerebral vascular accident) ([place]) -Plavix and Lipitor  COPD (chronic obstructive pulmonary disease) ([place]): No wheezing or rhonchi on auscultation. -Bronchodilators and Singulair  Type II diabetes mellitus with renal manifestations [facility]): Recent A1c 7.0, not well controlled. -Sliding scale insulin -Decreased dose of her 70/30 insulin from 36 to 24 units bid  Acute renal failure superimposed on stage 3a chronic kidney disease ([place]): Baseline creatinine 1.0 to on 07/03/2020.  Her creatinine is 1.59, BUN 35, may be due to cardiorenal syndrome and continuation/ -Hold irbesartan -Monitor renal function by BMP  Hypothyroidism: -Synthroid    DVT ppx: SQ Lovenox Code Status: DNR per her daughter Family Communication:  Yes, patient's daughter    at bed side Disposition Plan:  Anticipate discharge back to previous environment Consults called:  consulted Dr. Corky Sox of card Admission status and Level of care: Progressive Cardiac:     progressive unit   as inpt     Status is: Inpatient  Remains inpatient appropriate because:Inpatient level of care appropriate due to severity of illness  Dispo: The patient is from: Home              Anticipated d/c is to: Home              Patient currently is not medically stable to d/c.   Difficult to place patient No         Date of Service 01/26/2021    Ivor Costa [facility]   If 7PM-7AM, please contact  night-coverage [email or web address] 01/26/2021, 2:09 PM

## 2021-01-26 NOTE — Progress Notes (Signed)
PHARMACIST - PHYSICIAN COMMUNICATION  CONCERNING:  Enoxaparin (Lovenox) for DVT Prophylaxis    RECOMMENDATION: Patient was prescribed enoxaprin 40mg  q24 hours for VTE prophylaxis.   Filed Weights   01/26/21 0952  Weight: 86.6 kg (191 lb)    Body mass index is 37.3 kg/m.  Estimated Creatinine Clearance: 23 mL/min (A) (by C-G formula based on SCr of 1.59 mg/dL (H)).   Patient is candidate for enoxaparin 30mg  every 24 hours based on CrCl <80ml/min or Weight <45kg  DESCRIPTION: Pharmacy has adjusted enoxaparin dose per [facility] policy.  Patient is now receiving enoxaparin 30 mg every 24 hours    31m, PharmD, BCPS Clinical Pharmacist  01/26/2021 3:28 PM

## 2021-01-26 NOTE — Progress Notes (Signed)
Patient currently on BIPAP at 40%. Daughters at bedside.  At this time, patient on continuous bipap.  PO meds and insulin held at this time due to BIPAP. Dr. Clyde Lundborg aware and in agreement.

## 2021-01-26 NOTE — ED Provider Notes (Signed)
[facility] Emergency Department Provider Note  ____________________________________________   I have reviewed the triage vital signs and the nursing notes.   HISTORY  Chief Complaint Shortness of Breath   History limited by: Not Limited   HPI Peggy [name] is a 87 y.o. female who presents to the emergency department today because of concerns for shortness of breath.  Most history was obtained by family at bedside.  Patient apparently had a recent admission to outside hospital and discharged roughly 5 days ago.  That admission was due to CHF. Family does state that the patient had some shortness of breath after this admission however it got worse this morning.  Patient denies any pain in her chest.  Patient denies any recent swelling to the legs.  No fevers.  Records reviewed. Per medical record review patient has a [facility] of [facility].   Past Medical History:  Diagnosis Date   A-fib [facility])    Allergy    Arthritis    COPD (chronic obstructive pulmonary disease) (HCC)    Diabetes mellitus without complication (HCC)    GERD (gastroesophageal reflux disease)    Hyperlipidemia    Hypertension    Myocardial infarction (HCC)    Oxygen dependent    2 liters   Stroke (HCC)    5 years ago per daughter   TIA (transient ischemic attack)     Patient Active Problem List   Diagnosis Date Noted   ST elevation myocardial infarction (STEMI) of lateral wall (HCC) 07/02/2020   Abnormality of gait 05/02/2020   Atrial fibrillation with rapid ventricular response (HCC) 04/14/2020   Melena 04/14/2020   COPD with acute exacerbation (HCC) 03/30/2020   Elevated troponin level 03/30/2020   Leukocytosis 03/30/2020   COPD (chronic obstructive pulmonary disease) (HCC) 03/22/2020   Chest pain with high risk for cardiac etiology 11/09/2019   Osteoarthritis of multiple joints 03/01/2019   Uncontrolled hypertension 11/22/2018   Primary  osteoarthritis of both knees 09/07/2018   Chronic respiratory failure with hypoxia (HCC) 10/08/2017   Simple chronic bronchitis (HCC) 10/08/2017   SOB (shortness of breath) 09/24/2017   History of stroke 08/17/2017   Acute respiratory failure with hypoxia and hypercarbia (HCC) 08/07/2017   Respiratory failure (HCC) 08/07/2017   History of GI bleed 07/07/2017   Atrial fibrillation with RVR (HCC) 06/30/2017   Bilateral carotid artery disease (HCC) 04/14/2017   Healthcare maintenance 04/14/2017   Dizziness 04/07/2017   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 02/23/2017   TIA (transient ischemic attack) 12/10/2016   CVA (cerebral vascular accident) (HCC) 12/09/2016   Bilateral arm pain 11/27/2016   Myocardial infarction (HCC) 07/30/2015   Urinary frequency 02/09/2015   RUQ abdominal pain 01/26/2015   Shortness of breath 01/26/2015   Anxiety 01/26/2015   Chronic pruritus 12/21/2014   Cerebral vascular accident (HCC) 11/16/2014   Chronic low back pain 11/16/2014   Adult hypothyroidism 11/15/2014   Allergic rhinitis 11/15/2014   Body mass index (BMI) of 28.0-28.9 in adult 11/15/2014   Arthritis 11/15/2014   Cramps of lower extremity 11/15/2014   Essential (primary) hypertension 11/15/2014   Acid reflux 11/15/2014   Hypercholesteremia 11/15/2014   Cannot sleep 11/15/2014   Psoriasis 11/15/2014   Itch of skin 11/15/2014   Restless leg 11/15/2014   Type 2 diabetes mellitus with other specified complication (HCC) 11/15/2014   Breath shortness 11/15/2014   Dermatitis due to unknown cause 04/10/2014   CAD (coronary artery disease) 02/20/2014  AF (paroxysmal atrial fibrillation) (HCC) 02/20/2014   Temporary cerebral vascular dysfunction 02/20/2014   DD (diverticular disease) 10/05/2013    Past Surgical History:  Procedure Laterality Date   ABDOMINAL HYSTERECTOMY  1970   BACK SURGERY  2013   colectomy Right 10/08/2013   Dr. Egbert Garibaldi   CORONARY ANGIOPLASTY WITH STENT PLACEMENT      CORONARY/GRAFT ACUTE MI REVASCULARIZATION N/A 07/02/2020   Procedure: Coronary/Graft Acute MI Revascularization;  Surgeon: Iran Ouch, MD;  Location: [facility] INVASIVE CV LAB;  Service: Cardiovascular;  Laterality: N/A;   LEFT HEART CATH AND CORONARY ANGIOGRAPHY N/A 07/02/2020   Procedure: LEFT HEART CATH AND CORONARY ANGIOGRAPHY;  Surgeon: Iran Ouch, MD;  Location: [facility] INVASIVE CV LAB;  Service: Cardiovascular;  Laterality: N/A;    Prior to Admission medications   Medication Sig Start Date End Date Taking? Authorizing Provider  acetaminophen (TYLENOL) 500 MG tablet Take 500-1,000 mg by mouth every 6 (six) hours as needed for mild pain or moderate pain.    [provider]  acetaminophen-codeine (TYLENOL #3) 300-30 MG tablet Take one tab po bid as needed 12/13/20   Lyndon Code, MD  ALPRAZolam [facility]ud[facility] Feeler) 0.25 MG tablet TAKE ONE TAB PO QHS FOR SLEEP Patient not taking: No sig reported 04/12/20   Lyndon Code, MD  amiodarone (PACERONE) 200 MG tablet Take 1 tablet (200 mg total) by mouth 2 (two) times daily. 07/04/20   Arnetha Courser, MD  atorvastatin (LIPITOR) 80 MG tablet Take 1 tablet (80 mg total) by mouth daily. Patient not taking: Reported on 12/13/2020 07/05/20   Arnetha Courser, MD  budesonide-formoterol [facility]) 160-4.5 MCG/ACT inhaler Inhale 2 puffs into the lungs 2 (two) times daily. 09/12/20   Lyndon Code, MD  chlorpheniramine-HYDROcodone [facility]) 10-8 MG/5ML SUER Take 5 cc at bedtime at bed time 09/18/20   Lyndon Code, MD  Cholecalciferol (VITAMIN D3) 5000 units TABS Take 5,000 Units by mouth daily.    [provider]  clopidogrel (PLAVIX) 75 MG tablet Take 1 tablet (75 mg total) by mouth daily with breakfast. 07/05/20   Arnetha Courser, MD  diphenoxylate-atropine (LOMOTIL) 2.5-0.025 MG tablet Take 1 tablet by mouth 2 (two) times daily as needed for diarrhea or loose stools. 06/06/20   [provider]  doxycycline (VIBRA-TABS) 100 MG tablet  Take 1 tablet (100 mg total) by mouth 2 (two) times daily. 12/12/20   McDonough, Salomon Fick, PA-C  esomeprazole (NEXIUM) 20 MG capsule Take 20 mg by mouth 2 (two) times daily. 04/21/20   [provider]  gabapentin (NEURONTIN) 100 MG capsule Take 100 mg by mouth at bedtime.  11/23/19   [provider]  HUMULIN 70/30 KWIKPEN (70-30) 100 UNIT/ML KwikPen Inject 35 Units into the skin 2 (two) times daily. Patient taking differently: Inject 32 Units into the skin 2 (two) times daily. 04/01/20   Lurene Shadow, MD  hydrOXYzine (ATARAX/VISTARIL) 25 MG tablet Take 25 mg by mouth 4 (four) times daily as needed for itching. 06/05/17   [provider]  ipratropium-albuterol (DUONEB) 0.5-2.5 (3) MG/3ML SOLN Take 3 mLs by nebulization every 6 (six) hours as needed (SOB).    [provider]  irbesartan (AVAPRO) 150 MG tablet Take 1 tablet (150 mg total) by mouth daily. 07/05/20   Arnetha Courser, MD  isosorbide mononitrate (IMDUR) 30 MG 24 hr tablet Take 30 mg by mouth daily. 11/09/19   [provider]  levothyroxine (SYNTHROID) 50 MCG tablet Take 50 mcg by mouth daily before  breakfast.    [provider]  metoprolol succinate (TOPROL-XL) 100 MG 24 hr tablet Take 1 tablet (100 mg total) by mouth daily. Take with or immediately following a meal. 11/25/18   Jimmye Norman, NP  montelukast (SINGULAIR) 10 MG tablet Take 1 tablet (10 mg total) by mouth at bedtime. 12/13/20   Lyndon Code, MD  nitroGLYCERIN (NITROSTAT) 0.4 MG SL tablet Place under the tongue. 11/12/20 11/12/21  [provider]  predniSONE (DELTASONE) 5 MG tablet Take 4 tablets (20 mg total) by mouth daily with breakfast. Take 4 tablet daily for 1 week, then Take 3 tablets daily for 2 days, then Take 2 tablets daily 2 days, thenTake 1 tablet daily 10/04/20   McDonough, Salomon Fick, PA-C  traZODone (DESYREL) 50 MG tablet Take 1 tablet every night for sleep 08/16/20   Lyndon Code, MD  trospium (SANCTURA)  20 MG tablet Take 1 tablet (20 mg total) by mouth daily. 04/01/20   Lurene Shadow, MD  VENTOLIN HFA 108 (90 Base) MCG/ACT inhaler Inhale 2 puffs into the lungs every 6 (six) hours as needed for wheezing or shortness of breath.     [provider]  vitamin B-12 (CYANOCOBALAMIN) 1000 MCG tablet Take 1,000 mcg by mouth daily.    [provider]    Allergies Diphenhydramine, Metformin, Oxybutynin, Amlodipine, Ciprofloxacin, Lisinopril, Penicillins, Saxagliptin, and Sulfamethoxazole-trimethoprim  Family History  Problem Relation Age of Onset   Breast cancer Mother    Pancreatitis Father    Breast cancer Sister    Lung cancer Brother    Melanoma Brother    Throat cancer Brother     Social History Social History   Tobacco Use   Smoking status: Former    Packs/day: 1.00    Years: 30.00    Pack years: 30.00    Types: Cigarettes    Quit date: 11/30/1999    Years since quitting: 21.1   Smokeless tobacco: Never  Vaping Use   Vaping Use: Never used  Substance Use Topics   Alcohol use: No   Drug use: No    Review of Systems Constitutional: No fever/chills Eyes: No visual changes. ENT: No sore throat. Cardiovascular: Denies chest pain. Respiratory: Positive for shortness of breath. Gastrointestinal: No abdominal pain.  No nausea, no vomiting.  No diarrhea.   Genitourinary: Negative for dysuria. Musculoskeletal: Negative for back pain. Skin: Red area to buttock. Neurological: Negative for headaches, focal weakness or numbness.  ____________________________________________   PHYSICAL EXAM:  VITAL SIGNS: ED Triage Vitals  Enc Vitals Group     BP 01/26/21 0944 97/65     Pulse Rate 01/26/21 0944 80     Resp 01/26/21 0944 (!) 36     Temp --      Temp src --      SpO2 01/26/21 0944 (!) 84 %     Weight 01/26/21 0952 191 lb (86.6 kg)     Height 01/26/21 0952 5' (1.524 m)   Constitutional: Alert and oriented.  Eyes: Conjunctivae are normal.  ENT       Head: Normocephalic and atraumatic.      Nose: No congestion/rhinnorhea.      Mouth/Throat: Mucous membranes are moist.      Neck: No stridor. Hematological/Lymphatic/Immunilogical: No cervical lymphadenopathy. Cardiovascular: Normal rate, regular rhythm.  No murmurs, rubs, or gallops.  Respiratory: Increased respiratory effort and exertion.  Gastrointestinal: Soft and non tender. No rebound. No guarding.  Genitourinary: Deferred Musculoskeletal: Normal range of motion in all  extremities. No lower extremity edema. Neurologic:  Normal speech and language. No gross focal neurologic deficits are appreciated.  Skin:  Red and indurated area to right buttock Psychiatric: Mood and affect are normal. Speech and behavior are normal. Patient exhibits appropriate insight and judgment.  ____________________________________________    LABS (pertinent positives/negatives)  CBC wbc 32.5, hgb 9.0, plt 436 BNP 916.4 Trop hs 222 Mg 1.9 BMP na 126, cl 87, glu 267, cr 1.59 ____________________________________________   EKG  I, Phineas Semen, attending physician, personally viewed and interpreted this EKG  EKG Time: 0951 Rate: 129 Rhythm: sinus tachycardia Axis: left axis deviation Intervals: qtc 587 QRS: LBBB ST changes: no st elevation Impression: abnormal ekg ____________________________________________    RADIOLOGY  CXR Bilateral lung opacities concerning for edema.  ____________________________________________   PROCEDURES  Procedures  CRITICAL CARE Performed by: Phineas Semen   Total critical care time: 35 minutes  Critical care time was exclusive of separately billable procedures and treating other patients.  Critical care was necessary to treat or prevent imminent or life-threatening deterioration.  Critical care was time spent personally by me on the following activities: development of treatment plan with patient and/or surrogate as well as nursing,  discussions with consultants, evaluation of patient's response to treatment, examination of patient, obtaining history from patient or surrogate, ordering and performing treatments and interventions, ordering and review of laboratory studies, ordering and review of radiographic studies, pulse oximetry and re-evaluation of patient's condition.  ____________________________________________   INITIAL IMPRESSION / ASSESSMENT AND PLAN / ED COURSE  Pertinent labs & imaging results that were available during my care of the patient were reviewed by me and considered in my medical decision making (see chart for details).   Patient presented to the emergency department today because of concerns for shortness of breath.  On initial exam patient was quite tachypneic and had increased work of breathing.  Patient was placed on BiPAP.  She did seem to improve on the BiPAP.  Chest x-ray is concerning for pulmonary edema.  Also concern for possible healthcare associated pneumonia given recent hospitalization and elevated white count and blood.  Furthermore patient does have findings concerning for possible perirectal abscess.  Will plan on admission to the hospitalist service.  ____________________________________________   FINAL CLINICAL IMPRESSION(S) / ED DIAGNOSES  Final diagnoses:  SOB (shortness of breath)     Note: This dictation was prepared with Dragon dictation. Any transcriptional errors that result from this process are unintentional      Phineas Semen, MD 01/26/21 1258

## 2021-01-26 NOTE — ED Triage Notes (Signed)
Pt arrives POV with sob, worsening x few days per daughter. Sent back from triage urgently, as she was hypoxic and tachypneic. Daughter reports she was recently d/c from [place] [facility] on 8/22, and developed sob on Wed evening. Last night she had fever and cough. Placed on Bipap when roomed. Sats 100% now, 2 IV's established

## 2021-01-27 ENCOUNTER — Inpatient Hospital Stay
Admit: 2021-01-27 | Discharge: 2021-01-27 | Disposition: A | Discharge status: Home / Self Care | Payer: Medicare Other | Attending: Cardiology | Admitting: Cardiology

## 2021-01-27 LAB — ECHOCARDIOGRAM COMPLETE
AR max vel: 0.68 cm2
AV Area VTI: 0.66 cm2
AV Area mean vel: 0.68 cm2
AV Mean grad: 7 mmHg
AV Peak grad: 11 mmHg
Ao pk vel: 1.66 m/s
Area-P 1/2: 3.67 cm2
Calc EF: 33.8 %
Height: 60 in
MV M vel: 3.6 m/s
MV Peak grad: 51.8 mmHg
MV VTI: 0.63 cm2
Radius: 0.5 cm
S' Lateral: 3.06 cm
Single Plane A2C EF: 43.9 %
Single Plane A4C EF: 17.6 %
Weight: 3008.84 [oz_av]

## 2021-01-27 LAB — BASIC METABOLIC PANEL
Anion gap: 12 (ref 5–15)
Anion gap: 14 (ref 5–15)
Anion gap: 11 (ref 5–15)
BUN: 35 mg/dL — ABNORMAL HIGH (ref 8–23)
BUN: 37 mg/dL — ABNORMAL HIGH (ref 8–23)
BUN: 40 mg/dL — ABNORMAL HIGH (ref 8–23)
CO2: 24 mmol/L (ref 22–32)
CO2: 25 mmol/L (ref 22–32)
CO2: 23 mmol/L (ref 22–32)
Calcium: 8.4 mg/dL — ABNORMAL LOW (ref 8.9–10.3)
Calcium: 8.5 mg/dL — ABNORMAL LOW (ref 8.9–10.3)
Calcium: 8.1 mg/dL — ABNORMAL LOW (ref 8.9–10.3)
Chloride: 92 mmol/L — ABNORMAL LOW (ref 98–111)
Chloride: 94 mmol/L — ABNORMAL LOW (ref 98–111)
Chloride: 91 mmol/L — ABNORMAL LOW (ref 98–111)
Creatinine, Ser: 1.46 mg/dL — ABNORMAL HIGH (ref 0.44–1.00)
Creatinine, Ser: 1.61 mg/dL — ABNORMAL HIGH (ref 0.44–1.00)
Creatinine, Ser: 1.57 mg/dL — ABNORMAL HIGH (ref 0.44–1.00)
GFR, Estimated: 30 mL/min — ABNORMAL LOW (ref >60)
GFR, Estimated: 34 mL/min — ABNORMAL LOW (ref >60)
GFR, Estimated: 31 mL/min — ABNORMAL LOW (ref >60)
Glucose, Bld: 158 mg/dL — ABNORMAL HIGH (ref 70–99)
Glucose, Bld: 165 mg/dL — ABNORMAL HIGH (ref 70–99)
Glucose, Bld: 299 mg/dL — ABNORMAL HIGH (ref 70–99)
Potassium: 4.2 mmol/L (ref 3.5–5.1)
Potassium: 4.3 mmol/L (ref 3.5–5.1)
Potassium: 4.2 mmol/L (ref 3.5–5.1)
Sodium: 130 mmol/L — ABNORMAL LOW (ref 135–145)
Sodium: 131 mmol/L — ABNORMAL LOW (ref 135–145)
Sodium: 125 mmol/L — ABNORMAL LOW (ref 135–145)

## 2021-01-27 LAB — HEPATIC FUNCTION PANEL
ALT: 14 U/L (ref 0–44)
AST: 21 U/L (ref 15–41)
Albumin: 2.7 g/dL — ABNORMAL LOW (ref 3.5–5.0)
Alkaline Phosphatase: 54 U/L (ref 38–126)
Bilirubin, Direct: 0.2 mg/dL (ref 0.0–0.2)
Indirect Bilirubin: 1.1 mg/dL — ABNORMAL HIGH (ref 0.3–0.9)
Total Bilirubin: 1.3 mg/dL — ABNORMAL HIGH (ref 0.3–1.2)
Total Protein: 5.7 g/dL — ABNORMAL LOW (ref 6.5–8.1)

## 2021-01-27 LAB — HEMOGLOBIN A1C
Hgb A1c MFr Bld: 6.5 % — ABNORMAL HIGH (ref 4.8–5.6)
Mean Plasma Glucose: 139.85 mg/dL

## 2021-01-27 LAB — CBC
HCT: 26.7 % — ABNORMAL LOW (ref 36.0–46.0)
Hemoglobin: 8.2 g/dL — ABNORMAL LOW (ref 12.0–15.0)
MCH: 24.2 pg — ABNORMAL LOW (ref 26.0–34.0)
MCHC: 30.7 g/dL (ref 30.0–36.0)
MCV: 78.8 fL — ABNORMAL LOW (ref 80.0–100.0)
Platelets: 334 10*3/uL (ref 150–400)
RBC: 3.39 MIL/uL — ABNORMAL LOW (ref 3.87–5.11)
RDW: 15.6 % — ABNORMAL HIGH (ref 11.5–15.5)
WBC: 24.8 10*3/uL — ABNORMAL HIGH (ref 4.0–10.5)
nRBC: 0 % (ref 0.0–0.2)

## 2021-01-27 LAB — LIPID PANEL
Cholesterol: 172 mg/dL (ref 0–200)
HDL: 28 mg/dL — ABNORMAL LOW (ref >40)
LDL Cholesterol: 107 mg/dL — ABNORMAL HIGH (ref 0–99)
Total CHOL/HDL Ratio: 6.1 RATIO
Triglycerides: 184 mg/dL — ABNORMAL HIGH (ref <150)
VLDL: 37 mg/dL (ref 0–40)

## 2021-01-27 LAB — MAGNESIUM
Magnesium: 2 mg/dL (ref 1.7–2.4)
Magnesium: 1.9 mg/dL (ref 1.7–2.4)

## 2021-01-27 LAB — GLUCOSE, CAPILLARY
Glucose-Capillary: 174 mg/dL — ABNORMAL HIGH (ref 70–99)
Glucose-Capillary: 194 mg/dL — ABNORMAL HIGH (ref 70–99)
Glucose-Capillary: 269 mg/dL — ABNORMAL HIGH (ref 70–99)
Glucose-Capillary: 235 mg/dL — ABNORMAL HIGH (ref 70–99)

## 2021-01-27 LAB — SEDIMENTATION RATE: Sed Rate: 100 mm/h — ABNORMAL HIGH (ref 0–30)

## 2021-01-27 LAB — TROPONIN I (HIGH SENSITIVITY)
Troponin I (High Sensitivity): 1633 ng/L
Troponin I (High Sensitivity): 1270 ng/L (ref <18)

## 2021-01-27 LAB — C-REACTIVE PROTEIN: CRP: 29.1 mg/dL — ABNORMAL HIGH (ref <1.0)

## 2021-01-27 LAB — HIV ANTIBODY (ROUTINE TESTING W REFLEX): HIV Screen 4th Generation wRfx: NONREACTIVE

## 2021-01-27 LAB — TSH: TSH: 4.075 u[IU]/mL (ref 0.350–4.500)

## 2021-01-27 MED ORDER — DIGOXIN 0.25 MG/ML IJ SOLN
0.2500 mg | Freq: Once | INTRAMUSCULAR | Status: AC
  Administered 2021-01-27: 0.25 mg via INTRAVENOUS
  Filled 2021-01-27: qty 2

## 2021-01-27 MED ORDER — DILTIAZEM HCL 25 MG/5ML IV SOLN
5.0000 mg | Freq: Once | INTRAVENOUS | Status: AC
  Administered 2021-01-27: 5 mg via INTRAVENOUS
  Filled 2021-01-27: qty 5

## 2021-01-27 MED ORDER — TRAMADOL HCL 50 MG PO TABS
50.0000 mg | ORAL_TABLET | Freq: Two times a day (BID) | ORAL | Status: DC | PRN
Start: 2021-01-27 — End: 2021-01-31
  Administered 2021-01-27 – 2021-01-29 (×3): 50 mg via ORAL
  Filled 2021-01-27 (×3): qty 1

## 2021-01-27 MED ORDER — DICLOFENAC SODIUM 1 % EX GEL
2.0000 g | Freq: Four times a day (QID) | CUTANEOUS | Status: DC | PRN
  Administered 2021-01-27 (×2): 2 g via TOPICAL
  Filled 2021-01-27: qty 100

## 2021-01-27 MED ORDER — FUROSEMIDE 10 MG/ML IJ SOLN
40.0000 mg | Freq: Two times a day (BID) | INTRAMUSCULAR | Status: DC
  Administered 2021-01-27 – 2021-01-28 (×2): 40 mg via INTRAVENOUS
  Filled 2021-01-27 (×2): qty 4

## 2021-01-27 MED ORDER — AMIODARONE HCL 200 MG PO TABS: 100.0000 mg | ORAL_TABLET | Freq: Every day | ORAL | Status: DC

## 2021-01-27 MED ORDER — AMIODARONE HCL IN DEXTROSE 360-4.14 MG/200ML-% IV SOLN
60.0000 mg/h | INTRAVENOUS | Status: AC
  Administered 2021-01-27 (×3): 60 mg/h via INTRAVENOUS
  Filled 2021-01-27 (×2): qty 200

## 2021-01-27 MED ORDER — AMIODARONE HCL IN DEXTROSE 360-4.14 MG/200ML-% IV SOLN
30.0000 mg/h | INTRAVENOUS | Status: DC
  Administered 2021-01-27 – 2021-01-28 (×2): 30 mg/h via INTRAVENOUS
  Filled 2021-01-27: qty 200

## 2021-01-27 MED ORDER — MILRINONE LACTATE IN DEXTROSE 20-5 MG/100ML-% IV SOLN
0.2500 ug/kg/min | INTRAVENOUS | Status: DC
  Filled 2021-01-27: qty 100

## 2021-01-27 MED ORDER — HYDRALAZINE HCL 20 MG/ML IJ SOLN: 10.0000 mg | INTRAMUSCULAR | Status: DC | PRN

## 2021-01-27 NOTE — Progress Notes (Signed)
Pt is on 3L [place], SPO2 96%, HR 93 and pt is tol well. Bipap is on standby at bedside. Pt states she is sick on her stomach, SVN held at this time. Family at bedside.

## 2021-01-27 NOTE — Progress Notes (Signed)
Echo report  Report will not transfer from syngo to epic. Formal report to follow.  Left ventricular ejection fraction, by estimation, is 25 to 30%. The left ventricle has severely decreased function. The left ventricle demonstrates regional wall motion abnormalities (see scoring diagram/findings for description). There is mild left ventricular hypertrophy. Left ventricular diastolic parameters are indeterminate.Right ventricular systolic function is normal. The right ventricular size is normal.Left atrial size was mildly dilated.The mitral valve is normal in structure. Moderate mitral valve regurgitation. No evidence of mitral stenosis.The aortic valve is normal in structure. Aortic valve regurgitation is not visualized. Mild to moderate aortic valve sclerosis/calcification is present, without any evidence of aortic stenosis.The inferior vena cava is normal in size with <50% respiratory variability, suggesting right atrial pressure of 8 mmHg.  Compared to prior Echo 07/02/20 EF has worsened and LAD distribution is Akinetic in the setting of sepsis and AF with RVR.    Armando Reichert, MD

## 2021-01-27 NOTE — Progress Notes (Signed)
[facility] Cardiology  CARDIOLOGY CONSULT NOTE  Patient ID: Peggy [name] MRN: [id number] DOB/AGE: 85/29/1930 85 y.o.  Admit date: 01/26/2021 Referring Physician Lorretta Harp Primary Physician Lauro Regulus, MD Primary Cardiologist Jamse Mead Reason for Consultation Elevated troponin  HPI:  Peggy [name] is a 87 year old female with history of CAD (STEMI 07/02/2020 s/p DES to OM1, severe residual mid LAD disease), HFrEF (EF 35 to 40%), paroxysmal A. fib, hypertension, hyperlipidemia, GI bleeding on Xarelto and low-dose Eliquis, TIA 2018, COPD on home O2 who was admitted with shortness of breath and discovered to have sepsis from cellulitis of her left buttock.  Cardiology is consulted for her troponin elevation from 200-600.  Interval History - More awake and alert this morning.  - Complains of pain at IV insertion site. Ankle pain related to arthritis.  - Developed AF with RVR overnight. Received digoxin and 10 mg of diltiazem.  Review of systems complete and found to be negative unless listed above     Past Medical History:  Diagnosis Date   A-fib (HCC)    Allergy    Arthritis    COPD (chronic obstructive pulmonary disease) (HCC)    Diabetes mellitus without complication (HCC)    GERD (gastroesophageal reflux disease)    Hyperlipidemia    Hypertension    Myocardial infarction (HCC)    Oxygen dependent    2 liters   Stroke (HCC)    5 years ago per daughter   TIA (transient ischemic attack)     Past Surgical History:  Procedure Laterality Date   ABDOMINAL HYSTERECTOMY  1970   BACK SURGERY  2013   colectomy Right 10/08/2013   Dr. Egbert Garibaldi   CORONARY ANGIOPLASTY WITH STENT PLACEMENT     CORONARY/GRAFT ACUTE MI REVASCULARIZATION N/A 07/02/2020   Procedure: Coronary/Graft Acute MI Revascularization;  Surgeon: Iran Ouch, MD;  Location: [facility] INVASIVE CV LAB;  Service: Cardiovascular;  Laterality: N/A;   LEFT HEART CATH AND CORONARY ANGIOGRAPHY N/A 07/02/2020   Procedure: LEFT  HEART CATH AND CORONARY ANGIOGRAPHY;  Surgeon: Iran Ouch, MD;  Location: [facility] INVASIVE CV LAB;  Service: Cardiovascular;  Laterality: N/A;    Medications Prior to Admission  Medication Sig Dispense Refill Last Dose   budesonide-formoterol (SYMBICORT) 160-4.5 MCG/ACT inhaler Inhale 2 puffs into the lungs 2 (two) times daily. 1 each 3 01/25/2021   Cholecalciferol (VITAMIN D3) 5000 units TABS Take 5,000 Units by mouth daily.   01/25/2021   clopidogrel (PLAVIX) 75 MG tablet Take 1 tablet (75 mg total) by mouth daily with breakfast. 90 tablet 3 01/25/2021   esomeprazole (NEXIUM) 20 MG capsule Take 20 mg by mouth 2 (two) times daily.   01/25/2021   furosemide (LASIX) 20 MG tablet Take 20 mg by mouth.   01/25/2021   gabapentin (NEURONTIN) 100 MG capsule Take 100 mg by mouth at bedtime.    01/25/2021   HUMULIN 70/30 KWIKPEN (70-30) 100 UNIT/ML KwikPen Inject 35 Units into the skin 2 (two) times daily. (Patient taking differently: Inject 36 Units into the skin 2 (two) times daily.)   01/25/2021   hydrOXYzine (ATARAX/VISTARIL) 25 MG tablet Take 25 mg by mouth 4 (four) times daily as needed for itching.  5 01/25/2021   ipratropium-albuterol (DUONEB) 0.5-2.5 (3) MG/3ML SOLN Take 3 mLs by nebulization every 6 (six) hours as needed (SOB).   01/25/2021   isosorbide mononitrate (IMDUR) 30 MG 24 hr tablet Take 90 mg by mouth daily.   01/25/2021   levothyroxine (SYNTHROID) 50 MCG tablet  Take 50 mcg by mouth daily before breakfast.   01/25/2021   metoprolol succinate (TOPROL-XL) 100 MG 24 hr tablet Take 1 tablet (100 mg total) by mouth daily. Take with or immediately following a meal. 30 tablet 0 01/25/2021   montelukast (SINGULAIR) 10 MG tablet Take 1 tablet (10 mg total) by mouth at bedtime. 30 tablet 3 01/25/2021   traZODone (DESYREL) 50 MG tablet Take 1 tablet every night for sleep 90 tablet 1 01/25/2021   vitamin B-12 (CYANOCOBALAMIN) 1000 MCG tablet Take 1,000 mcg by mouth daily.   01/25/2021   acetaminophen  (TYLENOL) 500 MG tablet Take 500-1,000 mg by mouth every 6 (six) hours as needed for mild pain or moderate pain.   unknown at prn   acetaminophen-codeine (TYLENOL #3) 300-30 MG tablet Take one tab po bid as needed 15 tablet 0 unknown at prn   ALPRAZolam (XANAX) 0.25 MG tablet TAKE ONE TAB PO QHS FOR SLEEP (Patient not taking: No sig reported) 30 tablet 3 Not Taking   amiodarone (PACERONE) 200 MG tablet Take 1 tablet (200 mg total) by mouth 2 (two) times daily. (Patient not taking: No sig reported) 60 tablet 1 Not Taking   atorvastatin (LIPITOR) 80 MG tablet Take 1 tablet (80 mg total) by mouth daily. (Patient not taking: No sig reported) 90 tablet 1 Not Taking   chlorpheniramine-HYDROcodone (TUSSIONEX PENNKINETIC ER) 10-8 MG/5ML SUER Take 5 cc at bedtime at bed time (Patient not taking: No sig reported) 140 mL 0 Not Taking   diphenoxylate-atropine (LOMOTIL) 2.5-0.025 MG tablet Take 1 tablet by mouth 2 (two) times daily as needed for diarrhea or loose stools.   unknown at prn   doxycycline (VIBRA-TABS) 100 MG tablet Take 1 tablet (100 mg total) by mouth 2 (two) times daily. (Patient not taking: No sig reported) 20 tablet 0 Not Taking   irbesartan (AVAPRO) 150 MG tablet Take 1 tablet (150 mg total) by mouth daily. (Patient not taking: No sig reported) 90 tablet 0 Not Taking   nitroGLYCERIN (NITROSTAT) 0.4 MG SL tablet Place under the tongue.   unknown at prn   predniSONE (DELTASONE) 5 MG tablet Take 4 tablets (20 mg total) by mouth daily with breakfast. Take 4 tablet daily for 1 week, then Take 3 tablets daily for 2 days, then Take 2 tablets daily 2 days, thenTake 1 tablet daily (Patient not taking: No sig reported) 100 tablet 0 Not Taking   trospium (SANCTURA) 20 MG tablet Take 1 tablet (20 mg total) by mouth daily. (Patient not taking: Reported on 01/26/2021)   Not Taking   VENTOLIN HFA 108 (90 Base) MCG/ACT inhaler Inhale 2 puffs into the lungs every 6 (six) hours as needed for wheezing or shortness of  breath.   1 unknown at prn   Social History   Socioeconomic History   Marital status: Widowed    Spouse name: Not on file   Number of children: 3   Years of education: H/S   Highest education level: Not on file  Occupational History   Occupation: Retired  Tobacco Use   Smoking status: Former    Packs/day: 1.00    Years: 30.00    Pack years: 30.00    Types: Cigarettes    Quit date: 11/30/1999    Years since quitting: 21.1   Smokeless tobacco: Never  Vaping Use   Vaping Use: Never used  Substance and Sexual Activity   Alcohol use: No   Drug use: No   Sexual activity: Not on  file  Other Topics Concern   Not on file  Social History Narrative   Not on file   Social Determinants of Health   Financial Resource Strain: Not on file  Food Insecurity: Not on file  Transportation Needs: Not on file  Physical Activity: Not on file  Stress: Not on file  Social Connections: Not on file  Intimate Partner Violence: Not on file    Family History  Problem Relation Age of Onset   Breast cancer Mother    Pancreatitis Father    Breast cancer Sister    Lung cancer Brother    Melanoma Brother    Throat cancer Brother       Review of systems complete and found to be negative unless listed above    PHYSICAL EXAM  General: Elderly female. Laying in bed. HEENT:  Normocephalic and atramatic Neck:  No visible JVD. Lungs: Increased work of breathing.  Anterior lung fields without significant crackles.  BiPAP in place Heart: Irregularly irregular. Distant heart sounds. Abdomen: Bowel sounds are positive, abdomen soft and non-tender  Msk: No apparent deformities Extremities: Trace to 1+ bilateral lower extremity edema.  Mottling improved today. Neuro: Awake and alert today.  Psych: As above.  Labs:   Lab Results  Component Value Date   WBC 24.8 (H) 01/27/2021   HGB 8.2 (L) 01/27/2021   HCT 26.7 (L) 01/27/2021   MCV 78.8 (L) 01/27/2021   PLT 334 01/27/2021    Recent Labs   Lab 01/27/21 0522  NA 130*  131*  K 4.3  4.2  CL 92*  94*  CO2 24  25  BUN 35*  37*  CREATININE 1.61*  1.46*  CALCIUM 8.4*  8.5*  GLUCOSE 158*  165*    Lab Results  Component Value Date   CKTOTAL 130 12/10/2016   CKMB 3.4 10/06/2013   TROPONINI 0.52 (HH) 11/22/2018     Lab Results  Component Value Date   CHOL 172 01/27/2021   CHOL 189 04/15/2020   CHOL 216 (H) 03/07/2019   Lab Results  Component Value Date   HDL 28 (L) 01/27/2021   HDL 35 (L) 04/15/2020   HDL 33 (L) 03/07/2019   Lab Results  Component Value Date   LDLCALC 107 (H) 01/27/2021   LDLCALC 91 04/15/2020   LDLCALC 110 (H) 03/07/2019   Lab Results  Component Value Date   TRIG 184 (H) 01/27/2021   TRIG 314 (H) 04/15/2020   TRIG 365 (H) 03/07/2019   Lab Results  Component Value Date   CHOLHDL 6.1 01/27/2021   CHOLHDL 5.4 04/15/2020   CHOLHDL 6.5 03/07/2019   No results found for: LDLDIRECT    Radiology: CT PELVIS W CONTRAST  Result Date: 01/26/2021 CLINICAL DATA:  Concern for perirectal abscess EXAM: CT PELVIS WITH CONTRAST TECHNIQUE: Multidetector CT imaging of the pelvis was performed using the standard protocol following the bolus administration of intravenous contrast. CONTRAST:  46mL OMNIPAQUE IOHEXOL 350 MG/ML SOLN COMPARISON:  Prior CT of the abdomen and pelvis 10/08/2013 FINDINGS: Urinary Tract:  No abnormality visualized. Bowel: Colonic diverticular disease without CT evidence of active inflammation. Incompletely imaged surgical changes of prior right hemicolectomy. Vascular/Lymphatic: Atherosclerotic calcifications noted circumferentially around the distal aorta. No evidence of aneurysm. No suspicious lymphadenopathy. Reproductive: Surgical changes of prior hysterectomy. No mass or other significant abnormality Other: Diffuse interstitial stranding throughout the subcutaneous fat of the right inferior buttock. There is also associated mild skin thickening. No discrete fluid collection to  suggest the  presence of abscess. Musculoskeletal: No acute fracture or malalignment. Lower lumbar degenerative disc disease. IMPRESSION: Right buttock cellulitis without defined abscess. Colonic diverticular disease without CT evidence of active inflammation. Aortic Atherosclerosis (ICD10-I70.0). Electronically Signed   By: Malachy Moan M.D.   On: 01/26/2021 14:54   DG Chest Portable 1 View  Result Date: 01/26/2021 CLINICAL DATA:  Shortness of breath. EXAM: PORTABLE CHEST 1 VIEW COMPARISON:  April 14, 2020. FINDINGS: Stable cardiomediastinal silhouette. Increased bilateral perihilar and basilar interstitial densities are noted concerning for pulmonary edema. No pneumothorax is noted. Bony thorax is unremarkable. IMPRESSION: Increased bilateral lung opacities are noted concerning for worsening pulmonary edema. Aortic Atherosclerosis (ICD10-I70.0). Electronically Signed   By: Lupita Raider M.D.   On: 01/26/2021 10:25    EKG: Probably sinus tachycardia.  Left bundle branch block.  ASSESSMENT AND PLAN:  Peggy [name] is a 87 year old female with history of CAD (STEMI 07/02/2020 s/p DES to OM1, severe residual mid LAD disease), HFrEF (EF 35 to 40%), paroxysmal A. fib, hypertension, hyperlipidemia, GI bleeding on Xarelto and low-dose Eliquis, TIA 2018, COPD on home O2 who was admitted with shortness of breath and discovered to have sepsis from cellulitis of her left buttock.  Cardiology is consulted for her troponin elevation from 200-600.  #Demand ischemia from sepsis #Type II myocardial injury The patient is admitted with sepsis from a left buttock cellulitis as well as heart failure exacerbation which is likely driven by this infection.  She is not complaining of any chest pain.  Notably she has severe residual coronary artery disease including a 90% mid LAD lesion.  In this setting, it is not surprising that she would have an elevated troponin in the setting of systemic infection and heart failure  exacerbation. - Continue plavix and aspirin 81 mg - No indication for heparin at this time - Hold beta blocker in the setting of infection and boderline BP.  #Severe sepsis due to left buttock cellulitis Patient has multiorgan dysfunction including AKI, altered mental status, shortness of breath in the setting of a left buttock cellulitis.  Her treatment is complicated by the fact that she has chronic systolic heart failure and atrial fibrillation. -Antibiotics per primary team -Patient is extremely high risk for decompensation.  # Chronic hypoxic respiratory failure d/t COPD and HF # Acute on chronic systolic heart failure She is presenting with shortness of breath and sepsis, though on exam she does not appear overtly volume overloaded.  Her daughter also does not feel that she has been swelling over the last couple days prior to presentation.  Her proBNP is elevated but this is nonspecific in the setting of chronic heart failure, AKI, and sepsis.  At this time we should concentrate primarily on treatment of her sepsis rather than heart failure. -Hold metoprolol and ibrisartan -Volume status is difficult. If respiratory status worsens will resume IV lasix today. -Repeat echocardiogram tomorrow.  # Paroxysmal AF - Prior bleeding with anticoagulation -Previously on amiodarone but stopped due to a adverse reaction; not an allergy per patients family.  - Restart amiodarone today IV. No bolus to avoid av nodal effect since she received significant beta blockade overnight (Dig and dilt).    Signed: Armando Reichert MD 01/27/2021, 8:28 AM

## 2021-01-27 NOTE — Progress Notes (Signed)
PT Cancellation Note  Patient Details Name: Peggy [name] MRN: [id number] DOB: 03/28/1929   Cancelled Treatment:    Reason Eval/Treat Not Completed: Medical issues which prohibited therapy. PT order received, pt chart reviewed. Up-trending troponin values over the last 24 hours. PT consulted with MD who states pt appears to be quite sick and asks PT to wait until tomorrow. PT will hold at this time and reassess appropriateness at later date.    Basilia Jumbo PT, DPT 01/27/21 8:57 AM [phone]

## 2021-01-27 NOTE — Progress Notes (Signed)
[place] [facility] [place] NAME: Peggy [name]    MR#:  [id number]  DATE OF BIRTH:  10-10-1928  SUBJECTIVE:   came in from home with increasing shortness of breath and redness of the buttock area. Daughters in the room. Patient was earlier not able to tolerate BiPAP and wanted to eat food. REVIEW OF SYSTEMS:   Review of Systems  Constitutional:  Negative for chills, fever and weight loss.  HENT:  Negative for ear discharge, ear pain and nosebleeds.   Eyes:  Negative for blurred vision, pain and discharge.  Respiratory:  Positive for shortness of breath. Negative for sputum production, wheezing and stridor.   Cardiovascular:  Negative for chest pain, palpitations, orthopnea and PND.  Gastrointestinal:  Negative for abdominal pain, diarrhea, nausea and vomiting.  Genitourinary:  Negative for frequency and urgency.  Musculoskeletal:  Positive for back pain and joint pain.  Neurological:  Positive for weakness. Negative for sensory change, speech change and focal weakness.  Psychiatric/Behavioral:  Negative for depression and hallucinations. The patient is not nervous/anxious.   Tolerating Diet:yes Tolerating PT:   DRUG ALLERGIES:   Allergies  Allergen Reactions  . Diphenhydramine Rash    AGITATION/DELIRIUM  . Metformin Diarrhea  . Oxybutynin Other (See Comments)    dizzy  . Amlodipine Rash  . Ciprofloxacin Rash  . Lisinopril Rash  . Penicillins Rash    Family states was a "long time ago"  . Saxagliptin Rash  . Sulfamethoxazole-Trimethoprim Rash    VITALS:  Blood pressure (!) 101/42, pulse (!) 127, temperature 97.8 F (36.6 C), temperature source Axillary, resp. rate 17, height 5' (1.524 m), weight 85.3 kg, SpO2 99 %.  PHYSICAL EXAMINATION:   Physical Exam  GENERAL:  87 y.o.-year-old patient lying in the bed with no acute distress. Very deconditioned HEENT: Head atraumatic, normocephalic. Oropharynx and nasopharynx clear. BIPAP  + LUNGS: decreased breath sounds bilaterally, no wheezing, rales, rhonchi. No use of accessory muscles of respiration.  CARDIOVASCULAR: S1, S2 normal. No murmurs, rubs, or gallops. Irregular, tachycardia ABDOMEN: Soft, nontender, nondistended. Bowel sounds present. No organomegaly or mass.  EXTREMITIES: + edema b/l.    NEUROLOGIC: non focal PSYCHIATRIC:  patient is alert and awake  SKIN:    LABORATORY PANEL:  CBC Recent Labs  Lab 01/27/21 0522  WBC 24.8*  HGB 8.2*  HCT 26.7*  PLT 334    Chemistries  Recent Labs  Lab 01/27/21 0522  NA 130*  131*  K 4.3  4.2  CL 92*  94*  CO2 24  25  GLUCOSE 158*  165*  BUN 35*  37*  CREATININE 1.61*  1.46*  CALCIUM 8.4*  8.5*  MG 1.9  2.0  AST 21  ALT 14  ALKPHOS 54  BILITOT 1.3*   Cardiac Enzymes No results for input(s): TROPONINI in the last 168 hours. RADIOLOGY:  CT PELVIS W CONTRAST  Result Date: 01/26/2021 CLINICAL DATA:  Concern for perirectal abscess EXAM: CT PELVIS WITH CONTRAST TECHNIQUE: Multidetector CT imaging of the pelvis was performed using the standard protocol following the bolus administration of intravenous contrast. CONTRAST:  64m OMNIPAQUE IOHEXOL 350 MG/ML SOLN COMPARISON:  Prior CT of the abdomen and pelvis 10/08/2013 FINDINGS: Urinary Tract:  No abnormality visualized. Bowel: Colonic diverticular disease without CT evidence of active inflammation. Incompletely imaged surgical changes of prior right hemicolectomy. Vascular/Lymphatic: Atherosclerotic calcifications noted circumferentially around the distal aorta. No evidence of aneurysm. No suspicious lymphadenopathy. Reproductive: Surgical changes of prior hysterectomy. No  mass or other significant abnormality Other: Diffuse interstitial stranding throughout the subcutaneous fat of the right inferior buttock. There is also associated mild skin thickening. No discrete fluid collection to suggest the presence of abscess. Musculoskeletal: No acute fracture or  malalignment. Lower lumbar degenerative disc disease. IMPRESSION: Right buttock cellulitis without defined abscess. Colonic diverticular disease without CT evidence of active inflammation. Aortic Atherosclerosis (ICD10-I70.0). Electronically Signed   By: Jacqulynn Cadet M.D.   On: 01/26/2021 14:54   DG Chest Portable 1 View  Result Date: 01/26/2021 CLINICAL DATA:  Shortness of breath. EXAM: PORTABLE CHEST 1 VIEW COMPARISON:  April 14, 2020. FINDINGS: Stable cardiomediastinal silhouette. Increased bilateral perihilar and basilar interstitial densities are noted concerning for pulmonary edema. No pneumothorax is noted. Bony thorax is unremarkable. IMPRESSION: Increased bilateral lung opacities are noted concerning for worsening pulmonary edema. Aortic Atherosclerosis (ICD10-I70.0). Electronically Signed   By: Marijo Conception M.D.   On: 01/26/2021 10:25   ASSESSMENT AND PLAN:   Peggy [name] is a 87 y.o. female with medical history significant of hypertension, hyperlipidemia, diabetes mellitus, COPD on 2 L oxygen, stroke, TIA, GERD, hypothyroidism, anxiety, CAD, myocardial infarction, atrial fibrillation not on anticoagulants, GI bleeding, CHF with EF of 35-40%, CKD-3A, who presents with shortness of breath. Patient was found to have acute respiratory distress, oxygen saturation 84% on home level of 2 L oxygen, BiPAP started in ED.  severe sepsis due to cellulitis left buttock -- came in with white count of 32,000 tachycardia tachypnea and respiratory failure -- currently on IV vancomycin and Flagyl -- white count 32K--- 24K -- patient afebrile -- elevated ESR and CRP -- elevated Pro calcitonin -- CT pelvis no evidence of abscess  acute on chronic respiratory failure with hypoxia due to acute on chronic systolic congestive heart failure with severe cardiomyopathy and bilateral pulmonary edema -- patient currently on BiPAP -- study Lasix initially was not able to give it secondary to  hypotension -- no urine output so for today. Discussed with cardiology okay to start some Lasix --shows significant decrease EF of 25% with E kinases in the LAD region per cardiology. Continue medical management.  a fib with RVR elevated troponin demand ischemia -- seen by Dr. Corky Sox -- recommend starting patient on amiodarone drip given severe tachycardia -- Echo   history of CVA -- continue Plavix and Lipitor  COPD -- continue oxygen, bronchodilators and Singulair  acute renal failure superimposed on stage IIIa chronic kidney disease -- baseline creatinine 1.0 -- came in with creatinine of 1.5 -- monitor input output avoid nephrotoxic agents  Hypothyroidism -- continue Synthroid  bilateral ankle lower extremity pain secondary to DJD -- Voltaren gel and PRN Ultram  patient overall seems to have declined in the last few weeks. [place] [facility] [place] symptoms of CHF. Per family/daughter overall decline in health condition and deconditioning.  palliative care to see tomorrow  Pt is DNR per daughter  Procedures: Family communication : daughters in the room Consults : cardiology CODE STATUS: DNR DVT Prophylaxis : enoxaparin Level of care: Progressive Cardiac Status is: Inpatient  Remains inpatient appropriate because:Inpatient level of care appropriate due to severity of illness  Dispo: The patient is from: Home              Anticipated d/c is to:  tbd              Patient currently is not medically stable to d/c.  Difficult to place patient No        TOTAL TIME TAKING CARE OF THIS PATIENT: 35 minutes.  >50% time spent on counselling and coordination of care  Note: This dictation was prepared with Dragon dictation along with smaller phrase technology. Any transcriptional errors that result from this process are unintentional.  Fritzi Mandes M.D    [facility]   CC: Primary care physician;  Kirk Ruths, MD Patient ID: Peggy [name], female   DOB: [birth year], 87 y.o.   MRN: [id number]

## 2021-01-28 LAB — GLUCOSE, CAPILLARY
Glucose-Capillary: 200 mg/dL — ABNORMAL HIGH (ref 70–99)
Glucose-Capillary: 258 mg/dL — ABNORMAL HIGH (ref 70–99)
Glucose-Capillary: 245 mg/dL — ABNORMAL HIGH (ref 70–99)
Glucose-Capillary: 134 mg/dL — ABNORMAL HIGH (ref 70–99)

## 2021-01-28 LAB — BASIC METABOLIC PANEL
Anion gap: 11 (ref 5–15)
BUN: 48 mg/dL — ABNORMAL HIGH (ref 8–23)
CO2: 25 mmol/L (ref 22–32)
Calcium: 8.1 mg/dL — ABNORMAL LOW (ref 8.9–10.3)
Chloride: 89 mmol/L — ABNORMAL LOW (ref 98–111)
Creatinine, Ser: 1.82 mg/dL — ABNORMAL HIGH (ref 0.44–1.00)
GFR, Estimated: 26 mL/min — ABNORMAL LOW (ref >60)
Glucose, Bld: 195 mg/dL — ABNORMAL HIGH (ref 70–99)
Potassium: 3.7 mmol/L (ref 3.5–5.1)
Sodium: 125 mmol/L — ABNORMAL LOW (ref 135–145)

## 2021-01-28 LAB — MAGNESIUM: Magnesium: 2 mg/dL (ref 1.7–2.4)

## 2021-01-28 LAB — C DIFFICILE QUICK SCREEN W PCR REFLEX
C Diff antigen: NEGATIVE
C Diff interpretation: NOT DETECTED
C Diff toxin: NEGATIVE

## 2021-01-28 MED ORDER — LOPERAMIDE HCL 2 MG PO CAPS: 2.0000 mg | ORAL_CAPSULE | Freq: Four times a day (QID) | ORAL | Status: DC | PRN

## 2021-01-28 MED ORDER — ONDANSETRON HCL 4 MG/2ML IJ SOLN
4.0000 mg | Freq: Four times a day (QID) | INTRAMUSCULAR | Status: DC | PRN
  Administered 2021-01-28: 4 mg via INTRAVENOUS
  Filled 2021-01-28: qty 2

## 2021-01-28 MED ORDER — AMIODARONE HCL 200 MG PO TABS
400.0000 mg | ORAL_TABLET | Freq: Two times a day (BID) | ORAL | Status: DC
  Administered 2021-01-28 – 2021-01-30 (×5): 400 mg via ORAL
  Filled 2021-01-28 (×6): qty 2

## 2021-01-28 NOTE — Consult Note (Signed)
WOC Nurse Consult Note: Reason for Consult: DTPI to lower right buttock Wound type:pressure vs infection Pressure Injury POA: Yes Measurement:3cm x 2.2cm with no depth. Area is purple/maroon discoloration of skin consistent with DTPI Wound bed:N/A Drainage (amount, consistency, odor) None Periwound:erythema, edema Dressing procedure/placement/frequency: Turning and repositioning is in place while in bed. Topical care guidance is provided with xeroform gauze, topped with dry gauze 2x2 and covered with silicone bordered foam. A pressure redistribution chair cushion is provided for when OOB to chair and may be taken home upon discharge.  WOC nursing team will not follow, but will remain available to this patient, the nursing and medical teams.  Please re-consult if needed. Thanks, Ladona Mow, MSN, RN, GNP, [place]  Pager# [phone]

## 2021-01-28 NOTE — Progress Notes (Signed)
Inpatient Diabetes Program Recommendations  AACE/ADA: New Consensus Statement on Inpatient Glycemic Control (2015)  Target Ranges:  Prepandial:   less than 140 mg/dL      Peak postprandial:   less than 180 mg/dL (1-2 hours)      Critically ill patients:  140 - 180 mg/dL  Results for Peggy [name], Peggy [name] (MRN [id number]) as of 01/28/2021 09:08  Ref. Range 01/27/2021 07:34 01/27/2021 11:33 01/27/2021 16:01 01/27/2021 21:49  Glucose-Capillary Latest Ref Range: 70 - 99 mg/dL 419 (H)  2 units Novolog  70/30 Insulin HELD  194 (H)  2 units Novolog  269 (H)  5 units Novolog  24 units 70/30 Insulin 235 (H)  Results for Peggy [name], Peggy [name] (MRN [id number]) as of 01/28/2021 09:08  Ref. Range 01/28/2021 08:23  Glucose-Capillary Latest Ref Range: 70 - 99 mg/dL 353 (H)  2 units Novolog  24 units 70/30 Insulin  Results for Peggy [name], Peggy [name] (MRN [id number]) as of 01/28/2021 09:08  Ref. Range 01/27/2021 05:22  Hemoglobin A1C Latest Ref Range: 4.8 - 5.6 % 6.5 (H)   Admit with: Acute on chronic respiratory failure with hypoxia due to acute on chronic systolic CHF/ Severe sepsis due to cellulitis left buttock  History: DM, COPD, CKD  Home DM Meds: Humulin 70/30 Insulin 36 units BID  Current Orders: 70/30 Insulin 24 units BID      Novolog Sensitive Correction Scale/ SSI (0-9 units) TID AC + HS   MD- Please note that 70/30 Insulin was held yesterday AM due to pt being NPO in the AM  CBGs higher yesterday afternoon likely due to missed dose of Insulin that AM  Scheduled to get both doses of 70/30 Insulin this AM  Will follow   --Will follow patient during hospitalization--  Ambrose Finland RN, MSN, CDE Diabetes Coordinator Inpatient Glycemic Control Team Team Pager: [phone] (8a-5p)

## 2021-01-28 NOTE — Progress Notes (Signed)
[facility] [place] NAME: Peggy [name]    MR#:  [id number]  DATE OF BIRTH:  Jul 11, 1928  SUBJECTIVE:   came in from home with increasing shortness of breath and redness of the buttock area. Daughter in the room.  Patient appears much better today. She is on 3 L nasal cannula. Good urine output. Eating well. Continues to have some leg pain. REVIEW OF SYSTEMS:   Review of Systems  Constitutional:  Negative for chills, fever and weight loss.  HENT:  Negative for ear discharge, ear pain and nosebleeds.   Eyes:  Negative for blurred vision, pain and discharge.  Respiratory:  Positive for shortness of breath. Negative for sputum production, wheezing and stridor.   Cardiovascular:  Negative for chest pain, palpitations, orthopnea and PND.  Gastrointestinal:  Negative for abdominal pain, diarrhea, nausea and vomiting.  Genitourinary:  Negative for frequency and urgency.  Musculoskeletal:  Positive for back pain and joint pain.  Neurological:  Positive for weakness. Negative for sensory change, speech change and focal weakness.  Psychiatric/Behavioral:  Negative for depression and hallucinations. The patient is not nervous/anxious.   Tolerating Diet:yes Tolerating PT: SNF   DRUG ALLERGIES:   Allergies  Allergen Reactions  . Diphenhydramine Rash    AGITATION/DELIRIUM  . Metformin Diarrhea  . Oxybutynin Other (See Comments)    dizzy  . Amlodipine Rash  . Ciprofloxacin Rash  . Lisinopril Rash  . Penicillins Rash    Family states was a "long time ago"  . Saxagliptin Rash  . Sulfamethoxazole-Trimethoprim Rash    VITALS:  Blood pressure (!) 105/54, pulse 85, temperature 98.1 F (36.7 C), resp. rate 16, height 5' (1.524 m), weight 84.9 kg, SpO2 96 %.  PHYSICAL EXAMINATION:   Physical Exam  GENERAL:  87 y.o.-year-old patient lying in the bed with no acute distress. Very deconditioned HEENT: Head atraumatic, normocephalic. Oropharynx and  nasopharynx clear.  LUNGS: decreased breath sounds bilaterally, no wheezing, rales, rhonchi. No use of accessory muscles of respiration.  CARDIOVASCULAR: S1, S2 normal. No murmurs, rubs, or gallops. Irregular, tachycardia ABDOMEN: Soft, nontender, nondistended. Bowel sounds present. No organomegaly or mass.  EXTREMITIES: + edema b/l.    NEUROLOGIC: non focal PSYCHIATRIC:  patient is alert and awake  SKIN:    LABORATORY PANEL:  CBC Recent Labs  Lab 01/27/21 0522  WBC 24.8*  HGB 8.2*  HCT 26.7*  PLT 334     Chemistries  Recent Labs  Lab 01/27/21 0522 01/27/21 1408 01/28/21 0823  NA 130*  131*   < > 125*  K 4.3  4.2   < > 3.7  CL 92*  94*   < > 89*  CO2 24  25   < > 25  GLUCOSE 158*  165*   < > 195*  BUN 35*  37*   < > 48*  CREATININE 1.61*  1.46*   < > 1.82*  CALCIUM 8.4*  8.5*   < > 8.1*  MG 1.9  2.0  --  2.0  AST 21  --   --   ALT 14  --   --   ALKPHOS 54  --   --   BILITOT 1.3*  --   --    < > = values in this interval not displayed.    Cardiac Enzymes No results for input(s): TROPONINI in the last 168 hours. RADIOLOGY:  CT PELVIS W CONTRAST  Result Date: 01/26/2021 CLINICAL DATA:  Concern for perirectal  abscess EXAM: CT PELVIS WITH CONTRAST TECHNIQUE: Multidetector CT imaging of the pelvis was performed using the standard protocol following the bolus administration of intravenous contrast. CONTRAST:  80mL OMNIPAQUE IOHEXOL 350 MG/ML SOLN COMPARISON:  Prior CT of the abdomen and pelvis 10/08/2013 FINDINGS: Urinary Tract:  No abnormality visualized. Bowel: Colonic diverticular disease without CT evidence of active inflammation. Incompletely imaged surgical changes of prior right hemicolectomy. Vascular/Lymphatic: Atherosclerotic calcifications noted circumferentially around the distal aorta. No evidence of aneurysm. No suspicious lymphadenopathy. Reproductive: Surgical changes of prior hysterectomy. No mass or other significant abnormality Other: Diffuse  interstitial stranding throughout the subcutaneous fat of the right inferior buttock. There is also associated mild skin thickening. No discrete fluid collection to suggest the presence of abscess. Musculoskeletal: No acute fracture or malalignment. Lower lumbar degenerative disc disease. IMPRESSION: Right buttock cellulitis without defined abscess. Colonic diverticular disease without CT evidence of active inflammation. Aortic Atherosclerosis (ICD10-I70.0). Electronically Signed   By: Jacqulynn Cadet M.D.   On: 01/26/2021 14:54   ECHOCARDIOGRAM COMPLETE  Result Date: 01/27/2021    ECHOCARDIOGRAM REPORT   Patient Name:   Peggy [name] Date of Exam: 01/27/2021 Medical Rec #:  [id number]   Height:       60.0 in Accession #:    [id number]  Weight:       188.1 lb Date of Birth:  January 25, 1929   BSA:          1.818 m Patient Age:    87 years    BP:           102/51 mmHg Patient Gender: F           HR:           121 bpm. Exam Location:  [facility] Procedure: 2D Echo Indications:     Elevated Troponin  History:         Patient has prior history of Echocardiogram examinations.                  Previous Myocardial Infarction, TIA and COPD; Arrythmias:Atrial                  Fibrillation.  Sonographer:     L Thornton-Maynard Referring Phys:  [id number] Thurmond Butts MATTHEW ORGEL Diagnosing Phys: Donnelly Angelica IMPRESSIONS  1. Left ventricular ejection fraction, by estimation, is 25 to 30%. The left ventricle has severely decreased function. The left ventricle demonstrates regional wall motion abnormalities (see scoring diagram/findings for description). There is mild left  ventricular hypertrophy. Left ventricular diastolic parameters are indeterminate.  2. Right ventricular systolic function is normal. The right ventricular size is normal.  3. Left atrial size was mildly dilated.  4. The mitral valve is normal in structure. Moderate mitral valve regurgitation. No evidence of mitral stenosis.  5. The aortic valve is normal in structure. Aortic  valve regurgitation is not visualized. Mild to moderate aortic valve sclerosis/calcification is present, without any evidence of aortic stenosis.  6. The inferior vena cava is normal in size with <50% respiratory variability, suggesting right atrial pressure of 8 mmHg. Comparison(s): Changes from prior study are noted. LV function has worsened and anterior distribution is akinetic. This is in the setting of severe sepsis and AF with RVR. FINDINGS  Left Ventricle: Left ventricular ejection fraction, by estimation, is 25 to 30%. The left ventricle has severely decreased function. The left ventricle demonstrates regional wall motion abnormalities. The left ventricular internal cavity size was normal  in size. There is mild left ventricular  hypertrophy. Abnormal (paradoxical) septal motion, consistent with left bundle branch block. Left ventricular diastolic parameters are indeterminate.  LV Wall Scoring: The mid and distal anterior septum, entire apex, and mid inferoseptal segment are akinetic. Right Ventricle: The right ventricular size is normal. No increase in right ventricular wall thickness. Right ventricular systolic function is normal. Left Atrium: Left atrial size was mildly dilated. Right Atrium: Right atrial size was normal in size. Pericardium: Trivial pericardial effusion is present. Mitral Valve: The mitral valve is normal in structure. Moderate mitral valve regurgitation. No evidence of mitral valve stenosis. MV peak gradient, 10.5 mmHg. The mean mitral valve gradient is 6.0 mmHg. Tricuspid Valve: The tricuspid valve is not well visualized. Tricuspid valve regurgitation is not demonstrated. Aortic Valve: The aortic valve is normal in structure. Aortic valve regurgitation is not visualized. Mild to moderate aortic valve sclerosis/calcification is present, without any evidence of aortic stenosis. Aortic valve mean gradient measures 7.0 mmHg. Aortic valve peak gradient measures 11.0 mmHg. Aortic valve area,  by VTI measures 0.66 cm. Pulmonic Valve: The pulmonic valve was not well visualized. Pulmonic valve regurgitation is not visualized. Aorta: The aortic root was not well visualized. Venous: The inferior vena cava is normal in size with less than 50% respiratory variability, suggesting right atrial pressure of 8 mmHg. IAS/Shunts: The interatrial septum was not assessed.  LEFT VENTRICLE PLAX 2D LVIDd:         4.58 cm     Diastology LVIDs:         3.06 cm     LV e' medial:    4.05 cm/s LV PW:         1.30 cm     LV E/e' medial:  35.6 LV IVS:        1.03 cm     LV e' lateral:   6.32 cm/s LVOT diam:     1.70 cm     LV E/e' lateral: 22.8 LV SV:         17 LV SV Index:   10 LVOT Area:     2.27 cm  LV Volumes (MOD) LV vol d, MOD A2C: 80.5 ml LV vol d, MOD A4C: 85.0 ml LV vol s, MOD A2C: 45.2 ml LV vol s, MOD A4C: 70.0 ml LV SV MOD A2C:     35.3 ml LV SV MOD A4C:     85.0 ml LV SV MOD BP:      29.0 ml RIGHT VENTRICLE RV S prime:     10.90 cm/s TAPSE (M-mode): 1.4 cm LEFT ATRIUM             Index LA diam:        4.30 cm 2.36 cm/m LA Vol (A2C):   79.2 ml 43.56 ml/m LA Vol (A4C):   59.8 ml 32.89 ml/m LA Biplane Vol: 68.3 ml 37.56 ml/m  AORTIC VALVE                    PULMONIC VALVE AV Area (Vmax):    0.68 cm     PV Vmax:       0.97 m/s AV Area (Vmean):   0.68 cm     PV Peak grad:  3.7 mmHg AV Area (VTI):     0.66 cm AV Vmax:           166.00 cm/s AV Vmean:          123.000 cm/s AV VTI:  0.262 m AV Peak Grad:      11.0 mmHg AV Mean Grad:      7.0 mmHg LVOT Vmax:         49.60 cm/s LVOT Vmean:        36.600 cm/s LVOT VTI:          0.076 m LVOT/AV VTI ratio: 0.29 MITRAL VALVE MV Area (PHT): 3.67 cm      SHUNTS MV Area VTI:   0.63 cm      Systemic VTI:  0.08 m MV Peak grad:  10.5 mmHg     Systemic Diam: 1.70 cm MV Mean grad:  6.0 mmHg MV Vmax:       1.62 m/s MV Vmean:      113.0 cm/s MR Peak grad:    51.8 mmHg MR Mean grad:    32.0 mmHg MR Vmax:         360.00 cm/s MR Vmean:        265.0 cm/s MR PISA:          1.57 cm MR PISA Eff ROA: 26 mm MR PISA Radius:  0.50 cm MV E velocity: 144.00 cm/s Donnelly Angelica Electronically signed by Donnelly Angelica Signature Date/Time: 01/27/2021/1:08:06 PM    Final    ASSESSMENT AND PLAN:   Peggy [name] is a 86 y.o. female with medical history significant of hypertension, hyperlipidemia, diabetes mellitus, COPD on 2 L oxygen, stroke, TIA, GERD, hypothyroidism, anxiety, CAD, myocardial infarction, atrial fibrillation not on anticoagulants, GI bleeding, CHF with EF of 35-40%, CKD-3A, who presents with shortness of breath. Patient was found to have acute respiratory distress, oxygen saturation 84% on home level of 2 L oxygen, BiPAP started in ED.  severe sepsis due to cellulitis left buttock -- came in with white count of 32,000 tachycardia tachypnea and respiratory failure -- currently on IV vancomycin and Flagyl--change to IV vanc -- white count 32K--- 24K -- patient afebrile -- elevated ESR and CRP -- elevated Pro calcitonin -- CT pelvis no evidence of abscess  acute on chronic respiratory failure with hypoxia due to acute on chronic systolic congestive heart failure with severe cardiomyopathy and bilateral pulmonary edema -- patient currently on BiPAP -- study Lasix initially was not able to give it secondary to hypotension -- no urine output so for today. Discussed with cardiology okay to start some Lasix --shows significant decrease EF of 25% with E kinesis in the LAD region per cardiology. Continue medical management. -- 8/29-- good urine output. Continue Lasix.  a fib with RVR elevated troponin demand ischemia -- seen by Dr. Corky Sox -- recommend starting patient on amiodarone drip given severe tachycardia -- Echo as above --8/29-- change to oral amiodarone per cardiology recommendation  history of CVA -- continue Plavix and Lipitor  COPD -- continue oxygen, bronchodilators and Singulair  acute renal failure superimposed on stage IIIa chronic kidney disease --  baseline creatinine 1.0 -- came in with creatinine of 1.5 -- monitor input output avoid nephrotoxic agents  Hypothyroidism -- continue Synthroid  bilateral ankle lower extremity pain secondary to DJD -- Voltaren gel and PRN Ultram  Diarrhea -- C diff--neg  patient overall seems to have declined in the last few weeks. [place] [facility] [place] symptoms of CHF. Per family/daughter overall decline in health condition and deconditioning.  palliative care to see  Pt is DNR per daughter   Family communication : daughter in the room Consults : cardiology CODE  STATUS: DNR DVT Prophylaxis : enoxaparin Level of care: Progressive Cardiac Status is: Inpatient  Remains inpatient appropriate because:Inpatient level of care appropriate due to severity of illness  Dispo: The patient is from: Home              Anticipated d/c is to: rehab              Patient currently is not medically stable to d/c.   Difficult to place patient No        TOTAL TIME TAKING CARE OF THIS PATIENT: 25 minutes.  >50% time spent on counselling and coordination of care  Note: This dictation was prepared with Dragon dictation along with smaller phrase technology. Any transcriptional errors that result from this process are unintentional.  Fritzi Mandes M.D    [facility]   CC: Primary care physician; Kirk Ruths, MD Patient ID: Lennie Odor, female   DOB: 10/27/1928, 87 y.o.   MRN: [id number]

## 2021-01-28 NOTE — Evaluation (Signed)
Physical Therapy Evaluation Patient Details Name: Peggy [name] MRN: [id number] DOB: 1929-01-09 Today's Date: 01/28/2021   History of Present Illness  Pt is a 87 y/o M who presented with SOB. Pt was found to have acute respiratory distress & Bipap started in the ED. Pt is being treated for severe sepsis 2/2 cellulitis on L buttock & acute on chronic respiratory failure with hypoxia due to acute on chronic diastolic CHF with severe cardiomyopathy & B pulmonary edema. Upon admission pt noted to have increasing troponin, thought to be due to demand ischemia from sepsis. PMH: HTN, HLD, DM, COPD on 2L O2, stroke, TIA, GERD, hypothyroidism, anxiety, CAD, MI, a-fib not on anticoagulants, GI bleeding, CHF with EF of 35-40%, CKD 3A  Clinical Impression  Attending request PT see pt. Pt seen for PT evaluation with daughter present in room. Pt is HOH which limits session somewhat; pt on 3L/min via nasal cannula. Prior to admission, pt was ambulatory with SPC/RW but daughter notes pt was declining functionally as she was beginning to require physical assistance for mobility & notes 2 falls in the past 6 months. On this date pt is able to complete bed mobility with HOB elevated, bed rails & supervision and multiple sit<>stand & stand pivot transfers with RW & CGA. Gait not attempted as pt with c/o increasing nausea & requesting emesis back each time she stood. Anticipate pt can mobilize at least household distances with RW & min assist. Pt's daughter reports they are not able to provide 24/7 supervision at d/c & are eager for pt to d/c to STR to increase strength. Pt would benefit from STR upon d/c to maximize independence with functional mobility, reduce fall risk, & decrease caregiver burden prior to return home.     Follow Up Recommendations SNF;Supervision/Assistance - 24 hour    Equipment Recommendations  None recommended by PT    Recommendations for Other Services       Precautions / Restrictions  Precautions Precautions: Fall Restrictions Weight Bearing Restrictions: No      Mobility  Bed Mobility Overal bed mobility: Needs Assistance Bed Mobility: Supine to Sit     Supine to sit: Supervision;HOB elevated     General bed mobility comments: extra time & reliance on bed rails    Transfers Overall transfer level: Needs assistance Equipment used: Rolling walker (2 wheeled);None Transfers: Sit to/from [employer] to Stand: Min guard Stand pivot transfers: Min guard;Supervision       General transfer comment: sit<>stand from EOB, recliner & BSC, stand pivot bed>recliner<>BSC with and without RW with close supervision<>CGA but pt requiring UE support throughout as she would hold to recliner armrests when not using RW  Ambulation/Gait Ambulation/Gait assistance:  (deferred as pt with c/o increasing nausea & requesting emesis bag each time she stood)              Information systems manager [employer] (Stroke Patients Only)       Balance Overall balance assessment: Needs assistance Sitting-balance support: Feet supported Sitting balance-Leahy Scale: Good     Standing balance support: Single extremity supported;During functional activity Standing balance-Leahy Scale: Fair Standing balance comment: CGA standing balance                             Pertinent Vitals/Pain Pain Assessment: No/denies pain    Home Living Family/patient expects to  be discharged to:: Private residence Living Arrangements: Children Available Help at Discharge: Family;Available PRN/intermittently (daughter & 2 other adult children provide supervision with plans to hire sitter to stay with pt while daughter is at work, still does not have 24 hr supervision upon d/c) Type of Home: House Home Access: Stairs to enter Entrance Stairs-Rails: Right Entrance Stairs-Number of Steps: 3 Home Layout: One level Home Equipment: Walker -  4 wheels;Grab bars - tub/shower;Cane - single point;Grab bars - toilet;Bedside commode;Adaptive equipment;Hand held shower head      Prior Function Level of Independence: Needs assistance         Comments: Pt ambulates with SPC & RW but daughter endorses pt requiring more assistance for transfers & gait & notes 2 falls in the past 6 months.     Hand Dominance        Extremity/Trunk Assessment   Upper Extremity Assessment Upper Extremity Assessment: Generalized weakness    Lower Extremity Assessment Lower Extremity Assessment: Generalized weakness    Cervical / Trunk Assessment Cervical / Trunk Assessment: Kyphotic  Communication      Cognition Arousal/Alertness: Awake/alert Behavior During Therapy: WFL for tasks assessed/performed Overall Cognitive Status: Within Functional Limits for tasks assessed                                 General Comments: Follows simple commands with extra time      General Comments General comments (skin integrity, edema, etc.): Pt on 3L/min via nasal cannula, SpO2 reading as low as 79% but then changes to 100% - nurse notified, pt only endorses slight SOB. Pt with continent void & BM on toilet with pt performing peri hygiene without physical assistance.    Exercises     Assessment/Plan    PT Assessment Patient needs continued PT services  PT Problem List Decreased strength;Decreased mobility;Decreased activity tolerance;Decreased balance;Cardiopulmonary status limiting activity       PT Treatment Interventions DME instruction;Therapeutic activities;Modalities;Gait training;Therapeutic exercise;Patient/family education;Balance training;Stair training;Functional mobility training;Neuromuscular re-education;Manual techniques    PT Goals (Current goals can be found in the Care Plan section)  Acute Rehab PT Goals Patient Stated Goal: to go to rehab PT Goal Formulation: With family Time For Goal Achievement:  02/11/21 Potential to Achieve Goals: Good    Frequency Min 2X/week   Barriers to discharge Decreased caregiver support daughter reports they cannot provide 24 hr supervision    Co-evaluation               AM-PAC PT "6 Clicks" Mobility  Outcome Measure Help needed turning from your back to your side while in a flat bed without using bedrails?: None Help needed moving from lying on your back to sitting on the side of a flat bed without using bedrails?: A Little Help needed moving to and from a bed to a chair (including a wheelchair)?: A Little Help needed standing up from a chair using your arms (e.g., wheelchair or bedside chair)?: A Little Help needed to walk in hospital room?: A Little Help needed climbing 3-5 steps with a railing? : A Lot 6 Click Score: 18    End of Session Equipment Utilized During Treatment: Oxygen Activity Tolerance:  (limited by nausea) Patient left: in chair;with chair alarm set;with call bell/phone within reach;with nursing/sitter in room (staff member in room) Nurse Communication: Mobility status PT Visit Diagnosis: Unsteadiness on feet (R26.81);Muscle weakness (generalized) (M62.81)    Time: 6063-0160 PT Time Calculation (  min) (ACUTE ONLY): 23 min   Charges:   PT Evaluation $PT Eval Moderate Complexity: 1 Mod PT Treatments $Therapeutic Activity: 8-22 mins        [name], PT, DPT 01/28/21, 12:20 PM   [name] 01/28/2021, 12:17 PM

## 2021-01-28 NOTE — Progress Notes (Signed)
[facility] Cardiology  CARDIOLOGY CONSULT NOTE  Patient ID: SAVANHA ISLAND MRN: [id number] DOB/AGE: [birth year] 87 y.o.  Admit date: 01/26/2021 Referring Physician Lorretta Harp Primary Physician Lauro Regulus, MD Primary Cardiologist Jamse Mead Reason for Consultation Elevated troponin  HPI:  CASSANDA WALMER is a 87 year old female with history of CAD (STEMI 07/02/2020 s/p DES to OM1, severe residual mid LAD disease), HFrEF (EF 35 to 40%), paroxysmal A. fib, hypertension, hyperlipidemia, GI bleeding on Xarelto and low-dose Eliquis, TIA 2018, COPD on home O2 who was admitted with shortness of breath and discovered to have sepsis from cellulitis of her left buttock.  Cardiology is consulted for her troponin elevation from 200-600.  Interval History -Back in sinus rhythm this morning. -No acute events overnight.  Sitting up in bed this morning eating breakfast.  On 3 L O2 which is her baseline.  Review of systems complete and found to be negative unless listed above     Past Medical History:  Diagnosis Date   A-fib (HCC)    Allergy    Arthritis    COPD (chronic obstructive pulmonary disease) (HCC)    Diabetes mellitus without complication (HCC)    GERD (gastroesophageal reflux disease)    Hyperlipidemia    Hypertension    Myocardial infarction (HCC)    Oxygen dependent    2 liters   Stroke (HCC)    5 years ago per daughter   TIA (transient ischemic attack)     Past Surgical History:  Procedure Laterality Date   ABDOMINAL HYSTERECTOMY  1970   BACK SURGERY  2013   colectomy Right 10/08/2013   Dr. Egbert Garibaldi   CORONARY ANGIOPLASTY WITH STENT PLACEMENT     CORONARY/GRAFT ACUTE MI REVASCULARIZATION N/A 07/02/2020   Procedure: Coronary/Graft Acute MI Revascularization;  Surgeon: Iran Ouch, MD;  Location: [facility] INVASIVE CV LAB;  Service: Cardiovascular;  Laterality: N/A;   LEFT HEART CATH AND CORONARY ANGIOGRAPHY N/A 07/02/2020   Procedure: LEFT HEART CATH AND CORONARY ANGIOGRAPHY;  Surgeon:  Iran Ouch, MD;  Location: [facility] INVASIVE CV LAB;  Service: Cardiovascular;  Laterality: N/A;    Medications Prior to Admission  Medication Sig Dispense Refill Last Dose   budesonide-formoterol (SYMBICORT) 160-4.5 MCG/ACT inhaler Inhale 2 puffs into the lungs 2 (two) times daily. 1 each 3 01/25/2021   Cholecalciferol (VITAMIN D3) 5000 units TABS Take 5,000 Units by mouth daily.   01/25/2021   clopidogrel (PLAVIX) 75 MG tablet Take 1 tablet (75 mg total) by mouth daily with breakfast. 90 tablet 3 01/25/2021   esomeprazole (NEXIUM) 20 MG capsule Take 20 mg by mouth 2 (two) times daily.   01/25/2021   furosemide (LASIX) 20 MG tablet Take 20 mg by mouth.   01/25/2021   gabapentin (NEURONTIN) 100 MG capsule Take 100 mg by mouth at bedtime.    01/25/2021   HUMULIN 70/30 KWIKPEN (70-30) 100 UNIT/ML KwikPen Inject 35 Units into the skin 2 (two) times daily. (Patient taking differently: Inject 36 Units into the skin 2 (two) times daily.)   01/25/2021   hydrOXYzine (ATARAX/VISTARIL) 25 MG tablet Take 25 mg by mouth 4 (four) times daily as needed for itching.  5 01/25/2021   ipratropium-albuterol (DUONEB) 0.5-2.5 (3) MG/3ML SOLN Take 3 mLs by nebulization every 6 (six) hours as needed (SOB).   01/25/2021   isosorbide mononitrate (IMDUR) 30 MG 24 hr tablet Take 90 mg by mouth daily.   01/25/2021   levothyroxine (SYNTHROID) 50 MCG tablet Take 50 mcg by mouth daily before  breakfast.   01/25/2021   metoprolol succinate (TOPROL-XL) 100 MG 24 hr tablet Take 1 tablet (100 mg total) by mouth daily. Take with or immediately following a meal. 30 tablet 0 01/25/2021   montelukast (SINGULAIR) 10 MG tablet Take 1 tablet (10 mg total) by mouth at bedtime. 30 tablet 3 01/25/2021   traZODone (DESYREL) 50 MG tablet Take 1 tablet every night for sleep 90 tablet 1 01/25/2021   vitamin B-12 (CYANOCOBALAMIN) 1000 MCG tablet Take 1,000 mcg by mouth daily.   01/25/2021   acetaminophen (TYLENOL) 500 MG tablet Take 500-1,000 mg by mouth  every 6 (six) hours as needed for mild pain or moderate pain.   unknown at prn   acetaminophen-codeine (TYLENOL #3) 300-30 MG tablet Take one tab po bid as needed 15 tablet 0 unknown at prn   ALPRAZolam (XANAX) 0.25 MG tablet TAKE ONE TAB PO QHS FOR SLEEP (Patient not taking: No sig reported) 30 tablet 3 Not Taking   amiodarone (PACERONE) 200 MG tablet Take 1 tablet (200 mg total) by mouth 2 (two) times daily. (Patient not taking: No sig reported) 60 tablet 1 Not Taking   atorvastatin (LIPITOR) 80 MG tablet Take 1 tablet (80 mg total) by mouth daily. (Patient not taking: No sig reported) 90 tablet 1 Not Taking   chlorpheniramine-HYDROcodone (TUSSIONEX PENNKINETIC ER) 10-8 MG/5ML SUER Take 5 cc at bedtime at bed time (Patient not taking: No sig reported) 140 mL 0 Not Taking   diphenoxylate-atropine (LOMOTIL) 2.5-0.025 MG tablet Take 1 tablet by mouth 2 (two) times daily as needed for diarrhea or loose stools.   unknown at prn   doxycycline (VIBRA-TABS) 100 MG tablet Take 1 tablet (100 mg total) by mouth 2 (two) times daily. (Patient not taking: No sig reported) 20 tablet 0 Not Taking   irbesartan (AVAPRO) 150 MG tablet Take 1 tablet (150 mg total) by mouth daily. (Patient not taking: No sig reported) 90 tablet 0 Not Taking   nitroGLYCERIN (NITROSTAT) 0.4 MG SL tablet Place under the tongue.   unknown at prn   predniSONE (DELTASONE) 5 MG tablet Take 4 tablets (20 mg total) by mouth daily with breakfast. Take 4 tablet daily for 1 week, then Take 3 tablets daily for 2 days, then Take 2 tablets daily 2 days, thenTake 1 tablet daily (Patient not taking: No sig reported) 100 tablet 0 Not Taking   trospium (SANCTURA) 20 MG tablet Take 1 tablet (20 mg total) by mouth daily. (Patient not taking: Reported on 01/26/2021)   Not Taking   VENTOLIN HFA 108 (90 Base) MCG/ACT inhaler Inhale 2 puffs into the lungs every 6 (six) hours as needed for wheezing or shortness of breath.   1 unknown at prn   Social History    Socioeconomic History   Marital status: Widowed    Spouse name: Not on file   Number of children: 3   Years of education: H/S   Highest education level: Not on file  Occupational History   Occupation: Retired  Tobacco Use   Smoking status: Former    Packs/day: 1.00    Years: 30.00    Pack years: 30.00    Types: Cigarettes    Quit date: 11/30/1999    Years since quitting: 21.1   Smokeless tobacco: Never  Vaping Use   Vaping Use: Never used  Substance and Sexual Activity   Alcohol use: No   Drug use: No   Sexual activity: Not on file  Other Topics Concern  Not on file  Social History Narrative   Not on file   Social Determinants of Health   Financial Resource Strain: Not on file  Food Insecurity: Not on file  Transportation Needs: Not on file  Physical Activity: Not on file  Stress: Not on file  Social Connections: Not on file  Intimate Partner Violence: Not on file    Family History  Problem Relation Age of Onset   Breast cancer Mother    Pancreatitis Father    Breast cancer Sister    Lung cancer Brother    Melanoma Brother    Throat cancer Brother       Review of systems complete and found to be negative unless listed above    PHYSICAL EXAM  General: Elderly female. Laying in bed. HEENT:  Normocephalic and atramatic Neck:  No visible JVD. Lungs: Increased work of breathing.  Anterior lung fields without significant crackles.  BiPAP in place Heart: Irregularly irregular. Distant heart sounds. Abdomen: Bowel sounds are positive, abdomen soft and non-tender  Msk: No apparent deformities Extremities: Trace to 1+ bilateral lower extremity edema.  Mottling improved today. Neuro: Awake and alert today.  Psych: As above.  Labs:   Lab Results  Component Value Date   WBC 24.8 (H) 01/27/2021   HGB 8.2 (L) 01/27/2021   HCT 26.7 (L) 01/27/2021   MCV 78.8 (L) 01/27/2021   PLT 334 01/27/2021    Recent Labs  Lab 01/27/21 0522 01/27/21 1408  NA 130*   131* 125*  K 4.3  4.2 4.2  CL 92*  94* 91*  CO2 24  25 23   BUN 35*  37* 40*  CREATININE 1.61*  1.46* 1.57*  CALCIUM 8.4*  8.5* 8.1*  PROT 5.7*  --   BILITOT 1.3*  --   ALKPHOS 54  --   ALT 14  --   AST 21  --   GLUCOSE 158*  165* 299*    Lab Results  Component Value Date   CKTOTAL 130 12/10/2016   CKMB 3.4 10/06/2013   TROPONINI 0.52 (HH) 11/22/2018     Lab Results  Component Value Date   CHOL 172 01/27/2021   CHOL 189 04/15/2020   CHOL 216 (H) 03/07/2019   Lab Results  Component Value Date   HDL 28 (L) 01/27/2021   HDL 35 (L) 04/15/2020   HDL 33 (L) 03/07/2019   Lab Results  Component Value Date   LDLCALC 107 (H) 01/27/2021   LDLCALC 91 04/15/2020   LDLCALC 110 (H) 03/07/2019   Lab Results  Component Value Date   TRIG 184 (H) 01/27/2021   TRIG 314 (H) 04/15/2020   TRIG 365 (H) 03/07/2019   Lab Results  Component Value Date   CHOLHDL 6.1 01/27/2021   CHOLHDL 5.4 04/15/2020   CHOLHDL 6.5 03/07/2019   No results found for: LDLDIRECT    Radiology: CT PELVIS W CONTRAST  Result Date: 01/26/2021 CLINICAL DATA:  Concern for perirectal abscess EXAM: CT PELVIS WITH CONTRAST TECHNIQUE: Multidetector CT imaging of the pelvis was performed using the standard protocol following the bolus administration of intravenous contrast. CONTRAST:  37mL OMNIPAQUE IOHEXOL 350 MG/ML SOLN COMPARISON:  Prior CT of the abdomen and pelvis 10/08/2013 FINDINGS: Urinary Tract:  No abnormality visualized. Bowel: Colonic diverticular disease without CT evidence of active inflammation. Incompletely imaged surgical changes of prior right hemicolectomy. Vascular/Lymphatic: Atherosclerotic calcifications noted circumferentially around the distal aorta. No evidence of aneurysm. No suspicious lymphadenopathy. Reproductive: Surgical changes of prior hysterectomy. No  mass or other significant abnormality Other: Diffuse interstitial stranding throughout the subcutaneous fat of the right inferior  buttock. There is also associated mild skin thickening. No discrete fluid collection to suggest the presence of abscess. Musculoskeletal: No acute fracture or malalignment. Lower lumbar degenerative disc disease. IMPRESSION: Right buttock cellulitis without defined abscess. Colonic diverticular disease without CT evidence of active inflammation. Aortic Atherosclerosis (ICD10-I70.0). Electronically Signed   By: Malachy MoanHeath  McCullough M.D.   On: 01/26/2021 14:54   DG Chest Portable 1 View  Result Date: 01/26/2021 CLINICAL DATA:  Shortness of breath. EXAM: PORTABLE CHEST 1 VIEW COMPARISON:  April 14, 2020. FINDINGS: Stable cardiomediastinal silhouette. Increased bilateral perihilar and basilar interstitial densities are noted concerning for pulmonary edema. No pneumothorax is noted. Bony thorax is unremarkable. IMPRESSION: Increased bilateral lung opacities are noted concerning for worsening pulmonary edema. Aortic Atherosclerosis (ICD10-I70.0). Electronically Signed   By: Lupita RaiderJames  Green Jr M.D.   On: 01/26/2021 10:25    EKG: Probably sinus tachycardia.  Left bundle branch block.  ASSESSMENT AND PLAN:  Aurelio BrashK Meigs is a 87 year old female with history of CAD (STEMI 07/02/2020 s/p DES to OM1, severe residual mid LAD disease), HFrEF (EF 35 to 40%), paroxysmal A. fib, hypertension, hyperlipidemia, GI bleeding on Xarelto and low-dose Eliquis, TIA 2018, COPD on home O2 who was admitted with shortness of breath and discovered to have sepsis from cellulitis of her left buttock.  Cardiology is consulted for her troponin elevation from 200-600.  #Demand ischemia from sepsis #Type II myocardial injury The patient is admitted with sepsis from a left buttock cellulitis as well as heart failure exacerbation which is likely driven by this infection.  She is not complaining of any chest pain.  Notably she has severe residual coronary artery disease including a 90% mid LAD lesion.  In this setting, it is not surprising that she  would have an elevated troponin in the setting of systemic infection and heart failure exacerbation.  No indication for anticoagulation with heparin. - Continue plavix and aspirin 81 mg - Hold beta blocker in the setting of infection and boderline BP.  #Severe sepsis due to left buttock cellulitis Patient has multiorgan dysfunction including AKI, altered mental status, shortness of breath in the setting of a left buttock cellulitis.  Possibly may also have C. difficile given her significant episodes of diarrhea overnight.  Her treatment is complicated by the fact that she has chronic systolic heart failure and atrial fibrillation. -Antibiotics per primary team -Patient is extremely high risk for decompensation.  # Chronic hypoxic respiratory failure d/t COPD and HF # Acute on chronic systolic heart failure She is presenting with shortness of breath and sepsis, though on exam she does not appear overtly volume overloaded.  Her daughter also does not feel that she has been swelling over the last couple days prior to presentation.  Her proBNP is elevated but this is nonspecific in the setting of chronic heart failure, AKI, and sepsis.  Echo shows worsening LVEF with anterior hypokinesis in LAD distribution.  EF 20 to 25%. -Hold metoprolol and ibrisartan.  May resume low-dose metoprolol today. -Given IV Lasix yesterday.  Worsening AKI today.  Does not appear volume overloaded so we will hold Lasix for the remainder of the day and reassess tomorrow.   # Paroxysmal AF - Prior bleeding with anticoagulation -Back in sinus rhythm this morning.  We will transition from IV amiodarone to p.o. 400 mg twice daily for a total load of 5 g.  Please communicate with  pharmacy regarding number of doses required.  On this we will transition to 200 mg daily.   Signed: Armando Reichert MD 01/28/2021, 8:27 AM

## 2021-01-28 NOTE — Consult Note (Addendum)
   Heart  Failure Nurse Navigator Note  HFrEF 25 to 30%.  Mild LVH.  Normal right ventricular systolic function.  EF had previously been reported at 35 to 40%.   She presented to the emergency room with complaints of shortness of breath.  She had just been discharged from another hospital 5 days previous.  Comorbidities:  Hypertension Hyperlipidemia Diabetes COPD Anxiety Coronary artery disease next atrial fibrillation Chronic kidney disease stage III  Labs:  Sodium 125, potassium 3.7 chloride 89, CO2 25, BUN 48, creatinine 1.92. Intake 410 ml Output 1400 ml  Medications:  Amiodarone 40 mg BID Aspirin 81 mg daily Atorvastatin 80 mg daily Plavix 75 mg daily   Assessment:  General- She is awake and alert sitting up in the chair at bedside.  HEENT-Normocephalic, HOH  Cardiac-heart tones are regular  Abdomen-soft and non tender  Extremities- trace edema  Psych- is pleasant and appropriate.  Neuro- speech is clear.  Initial meeting with patient and daughter.  Spent a lot of time discussing diet.  She currently lives with daughter, who works during the day and patient fixes her own meals, some frozen dinners. Discussed food food types to eat and  food types to avoid.  Discussed salt substitute, recommended Mrs. Dash as others contain potassium chloride and can be a problem with abnormal kidney function.  Discussed fluid  restrictions drinking no more 64 ounces in a 24-hour period.  No more than eight 8 ounce cups.   They were given the living with heart failure teaching booklet low sodium dietary handout, low-sodium cookbook.  She has follow-up appointment outpatient heart failure clinic on September 6 at 2:30 in the afternoon.  Discussed weighing daily, and recording what to report to physician.  They voiced understanding.  Tresa Endo RN CHFN

## 2021-01-28 NOTE — Consult Note (Signed)
                                                                                   Consultation Note Date: 01/28/2021   Patient Name: Peggy [name]  DOB: 26-Jan-1929  MRN: [id number]  Age / Sex: 87 y.o., female  PCP: Lauro Regulus, MD Referring Physician: Enedina Finner, MD  Reason for Consultation:   HPI/Patient Profile: 87 y.o. female  with past medical history of COPD on 2L at baseline, CKD, DM, stroke, TIA, GERD, MI, CAD, CHF with EF 35-40% admitted on 01/26/2021 with shortness of breath- workup reveals sepsis possibly due to cellulitis of her left buttocks, heart failure exacerbation, a-fib with RVR, elevated troponins (likely due to demand ischemia from sepsis), acute on chronic kidney injury. Inpatient ECHO shows worsening heart failure with EF 20-25%. She did not tolerate bipap. Given lasix x 1 but difficulty to manage fluid volume due to low BP and increasing Cr. Palliative medicine consulted for goals of care as family indicates patient has been declining.   Primary Decision Maker NEXT OF KIN- daughter [name]  Discussion: On eval patient is sleeping comfortably. Per chart review she is back in NSR today. Cr is trending up. She was lethargic, unable to participate in goals of care- no family at bedside.   She is followed in the community [facility]. On their last visit in March 2022- patient living with daughter, ambulatory, requiring assistance with some iADLs.    She has a MOST form on file indicating preferences for: DNR,  comfort measures, antibiotics if indicated, IV fluids for a trial period, and No feeding tube.   Called patient's daughter [name]- plan made to meet tomorrow afternoon.     SUMMARY OF RECOMMENDATIONS -Continue current care -Family meeting planned for tomorrow at 2pm     Code Status/Advance Care Planning: DNR   Prognosis:   Unable to determine  Discharge Planning: To Be Determined   SpO2: SpO2: 91 % O2 Device:SpO2: 91 % O2  Flow Rate: .O2 Flow Rate (L/min): 3 L/min  IO: Intake/output summary:  Intake/Output Summary (Last 24 hours) at 01/28/2021 1402 Last data filed at 01/28/2021 1214 Gross per 24 hour  Intake 1056.22 ml  Output 1100 ml  Net -43.78 ml    LBM: Last BM Date: 01/28/21 Baseline Weight: Weight: 86.6 kg Most recent weight: Weight: 84.9 kg     Palliative Assessment/Data: PPS: 30%     Thank you for this consult. Palliative medicine will continue to follow and assist as needed.   Time In: 1520 Time Out: 1430 Time Total: 70 mins Greater than 50%  of this time was spent counseling and coordinating care related to the above assessment and plan.  Signed by: Ocie Bob, AGNP-C Palliative Medicine    Please contact Palliative Medicine Team phone at [phone] for questions and concerns.  For individual provider: See Loretha Stapler

## 2021-01-29 ENCOUNTER — Inpatient Hospital Stay: Payer: Medicare Other

## 2021-01-29 DIAGNOSIS — L089 Local infection of the skin and subcutaneous tissue, unspecified: Secondary | ICD-10-CM

## 2021-01-29 DIAGNOSIS — Z7189 Other specified counseling: Secondary | ICD-10-CM

## 2021-01-29 DIAGNOSIS — Z515 Encounter for palliative care: Secondary | ICD-10-CM

## 2021-01-29 LAB — CBC
HCT: 22.9 % — ABNORMAL LOW (ref 36.0–46.0)
Hemoglobin: 7.3 g/dL — ABNORMAL LOW (ref 12.0–15.0)
MCH: 24.8 pg — ABNORMAL LOW (ref 26.0–34.0)
MCHC: 31.9 g/dL (ref 30.0–36.0)
MCV: 77.9 fL — ABNORMAL LOW (ref 80.0–100.0)
Platelets: 357 10*3/uL (ref 150–400)
RBC: 2.94 MIL/uL — ABNORMAL LOW (ref 3.87–5.11)
RDW: 15.5 % (ref 11.5–15.5)
WBC: 22.3 10*3/uL — ABNORMAL HIGH (ref 4.0–10.5)
nRBC: 0 % (ref 0.0–0.2)

## 2021-01-29 LAB — CREATININE, SERUM
Creatinine, Ser: 1.85 mg/dL — ABNORMAL HIGH (ref 0.44–1.00)
GFR, Estimated: 26 mL/min — ABNORMAL LOW (ref >60)

## 2021-01-29 LAB — GLUCOSE, CAPILLARY
Glucose-Capillary: 117 mg/dL — ABNORMAL HIGH (ref 70–99)
Glucose-Capillary: 173 mg/dL — ABNORMAL HIGH (ref 70–99)
Glucose-Capillary: 243 mg/dL — ABNORMAL HIGH (ref 70–99)

## 2021-01-29 MED ORDER — LORAZEPAM 2 MG/ML PO CONC: 1.0000 mg | ORAL | Status: DC | PRN

## 2021-01-29 MED ORDER — METRONIDAZOLE 500 MG/100ML IV SOLN: 500.0000 mg | Freq: Two times a day (BID) | INTRAVENOUS | Status: DC

## 2021-01-29 MED ORDER — GLYCOPYRROLATE 0.2 MG/ML IJ SOLN
0.2000 mg | INTRAMUSCULAR | Status: DC | PRN
  Filled 2021-01-29: qty 1

## 2021-01-29 MED ORDER — HALOPERIDOL LACTATE 5 MG/ML IJ SOLN: 0.5000 mg | INTRAMUSCULAR | Status: DC | PRN

## 2021-01-29 MED ORDER — HYDROMORPHONE HCL 1 MG/ML IJ SOLN
0.5000 mg | INTRAMUSCULAR | Status: DC | PRN
  Administered 2021-01-29 – 2021-01-31 (×10): 0.5 mg via INTRAVENOUS
  Filled 2021-01-29 (×10): qty 1

## 2021-01-29 MED ORDER — GLYCOPYRROLATE 1 MG PO TABS
1.0000 mg | ORAL_TABLET | ORAL | Status: DC | PRN
  Filled 2021-01-29: qty 1

## 2021-01-29 MED ORDER — LORAZEPAM 1 MG PO TABS: 1.0000 mg | ORAL_TABLET | ORAL | Status: DC | PRN

## 2021-01-29 MED ORDER — HALOPERIDOL 0.5 MG PO TABS
0.5000 mg | ORAL_TABLET | ORAL | Status: DC | PRN
  Filled 2021-01-29: qty 1

## 2021-01-29 MED ORDER — BIOTENE DRY MOUTH MT LIQD: 15.0000 mL | OROMUCOSAL | Status: DC | PRN

## 2021-01-29 MED ORDER — HALOPERIDOL LACTATE 2 MG/ML PO CONC
0.5000 mg | ORAL | Status: DC | PRN
  Filled 2021-01-29: qty 0.3

## 2021-01-29 MED ORDER — LORAZEPAM 2 MG/ML IJ SOLN
1.0000 mg | INTRAMUSCULAR | Status: DC | PRN
  Administered 2021-01-29 – 2021-01-30 (×2): 1 mg via INTRAVENOUS
  Filled 2021-01-29 (×2): qty 1

## 2021-01-29 MED ORDER — POLYVINYL ALCOHOL 1.4 % OP SOLN
1.0000 [drp] | Freq: Four times a day (QID) | OPHTHALMIC | Status: DC | PRN
  Filled 2021-01-29: qty 15

## 2021-01-29 MED ORDER — FUROSEMIDE 10 MG/ML IJ SOLN
40.0000 mg | Freq: Once | INTRAMUSCULAR | Status: AC
  Administered 2021-01-29: 40 mg via INTRAVENOUS
  Filled 2021-01-29: qty 4

## 2021-01-29 MED ORDER — CEFAZOLIN SODIUM-DEXTROSE 1-4 GM/50ML-% IV SOLN: 1.0000 g | Freq: Two times a day (BID) | INTRAVENOUS | Status: DC

## 2021-01-29 NOTE — Progress Notes (Signed)
Daily Progress Note   Patient Name: Peggy [name]       Date: 01/29/2021 DOB: 06/01/1929  Age: 87 y.o. MRN#: [id number] Attending Physician: Fritzi Mandes, MD Primary Care Physician: Kirk Ruths, MD Admit Date: 01/26/2021  Reason for Consultation/Follow-up: Establishing goals of care  Patient Profile/HPI:  87 y.o. female  with past medical history of COPD on 2L at baseline, CKD, DM, stroke, TIA, GERD, MI, CAD, CHF with EF 35-40% admitted on 01/26/2021 with shortness of breath- workup reveals sepsis possibly due to cellulitis of her left buttocks, heart failure exacerbation, a-fib with RVR, elevated troponins (likely due to demand ischemia from sepsis), acute on chronic kidney injury. Inpatient ECHO shows worsening heart failure with EF 20-25%. She did not tolerate bipap. Given lasix x 1 but difficulty to manage fluid volume due to low BP and increasing Cr. Palliative medicine consulted for goals of care as family indicates patient has been declining.   Subjective: Patient is back on bipap today after worsening SOB this morning.  Her Cr continues to rise.  Met with patient's daughters- Peggy [name] and Peggy [name].  Prior to admission patient was living with Peggy [name]. Her functional status was poor- she was SOB with all ADL's and any ambulation. She has been sleeping more than awake, and her po intake has been greatly reduced.  They note she has been on a decline for several months.  Peggy [name] states- "she's had no quality of life". We discussed her values and goals- she greatly values being independent.  Trajectory of her current illness was discussed- we discussed the difficulty in balancing her need for fluids, with the risk of volume overload to her heart. We also discussed the difficulty in needing to  give IV lasix for her heart, but the risk that it injures her kidneys further.  I reviewed her MOST form with her daughters and her indications of "comfort measures" if she had a life threatening illness.  Options for continued life prolonging care vs transition to comfort measures and hospice care was discussed. Symptom management was discussed.  Peggy [name] readily shares that she believes her mother would choose comfort. Peggy [name] has more difficulty with this idea.  Peggy [name] wishes to discuss this further with Palliative presence- Peggy [name] is sleeping currently and not able to wake and have discussion.   Review of Systems  Unable to perform ROS: Acuity of condition    Physical Exam Vitals and nursing note reviewed.  Pulmonary:     Comments: On bipap Neurological:     Comments: sleeping            Vital Signs: BP (!) 118/56 (BP Location: Right Arm)   [name] (!) 102   Temp 98.1 F (36.7 C) (Rectal)   Resp 19   Ht 5' (1.524 m)   Wt 81.1 kg   SpO2 99%   BMI 34.90 kg/m  SpO2: SpO2: 99 % O2 Device: O2 Device: Nasal Cannula O2 Flow Rate: O2 Flow Rate (L/min): 4 L/min  Intake/output summary:  Intake/Output Summary (Last 24 hours) at 01/29/2021 1500 Last data filed at 01/29/2021 1350 Gross per 24 hour  Intake 920 ml  Output 550 ml  Net 370 ml   LBM: Last BM Date: 01/28/21 Baseline Weight: Weight: 86.6 kg Most recent weight: Weight: 81.1 kg       Palliative Assessment/Data: PPS: 20%      Patient Active Problem List   Diagnosis Date Noted  . Acute on chronic respiratory failure with hypoxia ([place]) 01/26/2021  . Acute on chronic systolic CHF (congestive heart failure) ([place]) 01/26/2021  . Elevated troponin 01/26/2021  . Type II diabetes mellitus with renal manifestations ([place]) 01/26/2021  . Acute renal failure superimposed on stage 3a chronic kidney disease ([place]) 01/26/2021  . Hyponatremia 01/26/2021  . Sepsis ([place]) 01/26/2021  . Cellulitis of left buttock 01/26/2021  . ST elevation  myocardial infarction (STEMI) of lateral wall ([place]) 07/02/2020  . Abnormality of gait 05/02/2020  . Atrial fibrillation with rapid ventricular response ([place]) 04/14/2020  . Melena 04/14/2020  . COPD with acute exacerbation ([place]) 03/30/2020  . Elevated troponin level 03/30/2020  . Leukocytosis 03/30/2020  . COPD (chronic obstructive pulmonary disease) ([place]) 03/22/2020  . Chest pain with high risk for cardiac etiology 11/09/2019  . Osteoarthritis of multiple joints 03/01/2019  . Uncontrolled hypertension 11/22/2018  . Primary osteoarthritis of both knees 09/07/2018  . Chronic respiratory failure with hypoxia ([place]) 10/08/2017  . Simple chronic bronchitis ([place]) 10/08/2017  . SOB (shortness of breath) 09/24/2017  . History of stroke 08/17/2017  . Acute respiratory failure with hypoxia and hypercarbia ([place]) 08/07/2017  . Respiratory failure ([place]) 08/07/2017  . History of GI bleed 07/07/2017  . Atrial fibrillation with RVR ([place]) 06/30/2017  . Bilateral carotid artery disease ([place]) 04/14/2017  . Healthcare maintenance 04/14/2017  . Dizziness 04/07/2017  . 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 TIA (transient ischemic attack) 12/10/2016  . CVA (cerebral vascular accident) ([place]) 12/09/2016  . Bilateral arm pain 11/27/2016  . Myocardial infarction ([place]) 07/30/2015  . Urinary frequency 02/09/2015  . RUQ abdominal pain 01/26/2015  . Shortness of breath 01/26/2015  . Anxiety 01/26/2015  . Chronic pruritus 12/21/2014  . Cerebral vascular accident ([place]) 11/16/2014  . Chronic low back pain 11/16/2014  . Adult hypothyroidism 11/15/2014  . Allergic rhinitis 11/15/2014  . Body mass index (BMI) of 28.0-28.9 in adult 11/15/2014  . Arthritis 11/15/2014  . Cramps of lower extremity 11/15/2014  . Essential (primary) hypertension 11/15/2014  . Acid reflux 11/15/2014  . Hypercholesteremia 11/15/2014  . Cannot sleep 11/15/2014  . Psoriasis 11/15/2014  . Itch of skin 11/15/2014   . Restless leg 11/15/2014  . Type 2 diabetes mellitus with other specified complication ([place]) 69/67/8938  . Breath shortness 11/15/2014  . Dermatitis due to unknown cause 04/10/2014  . CAD (coronary artery  disease) 02/20/2014  . AF (paroxysmal atrial fibrillation) ([place]) 02/20/2014  . Temporary cerebral vascular dysfunction 02/20/2014  . DD (diverticular disease) 10/05/2013    Palliative Care Assessment & Plan    Assessment/Recommendations/Plan  Heart failure exacerbation in the setting of sepsis- continue current plan- family considering transition to comfort and hospice- no escalation, no pressors, no ICU Acute renal injury in setting of heart failure exacerbation and sepsis- would not  want dialysis- would likely not be a candidate Sepsis r/t cellulitis- currently on antibiotics- family considering transition to comfort GOC- family is considering transition to full comfort care and Hospice- will stop by later today to assess patient's mental status and her ability to participate in [place] discussion   Code Status: DNR  Prognosis:  Unable to determine  Discharge Planning: To Be Determined  Care plan was discussed with patient's daughters and care team  Thank you for allowing the Palliative Medicine Team to assist in the care of this patient.  Total time: 46 mins Prolonged billing: No     Greater than 50%  of this time was spent counseling and coordinating care related to the above assessment and plan.  Peggy [name], AGNP-C Palliative Medicine   Please contact Palliative Medicine Team phone at [phone] for questions and concerns.

## 2021-01-29 NOTE — Progress Notes (Signed)
Palliative  Additional encounter-  Returned to room as promised in effort to have GOC discussion with patient. She was off bipap, awake and alert. Infectious disease MD at bedside.   Per daughterOlegario Messier- she discussed option for comfort and Hospice with patient. Patient was able to verbalize that was her preference and that "she was ready".   I was also able to discuss this with patient.    Plan- -Comfort medications -D/C medications and interventions that are not focused on comfort -Do not restart bipap- give medication for SOB -TOC referral for inpatient Hospice- patient will need significant symptom management   Total additional time: 48 mins  Ocie Bob, AGNP-C Palliative Medicine  Greater than 50%  of this time was spent counseling and coordinating care related to the above assessment and plan.

## 2021-01-29 NOTE — Progress Notes (Signed)
Mobility Specialist - Progress Note   01/29/21 1400  Mobility  Activity Contraindicated/medical hold  Mobility performed by Mobility specialist    Per RN, pt not appropriate for activity at this time. Increased SOB. Will attempt session another date as appropriate.    Filiberto Pinks Mobility Specialist 01/29/21, 2:33 PM

## 2021-01-29 NOTE — Progress Notes (Signed)
Patient ID: Peggy [name], female   DOB: 1928/08/24, 87 y.o.   MRN: [id number]  according to palliative care nurse practitioner discussion with daughters and patient-- patient is now full comfort care. Will transfer patient to MedSurg floor.  TOC consult for hospice referral

## 2021-01-29 NOTE — Care Management Important Message (Signed)
Important Message  Patient Details  Name: Peggy [name] MRN: [id number] Date of Birth: March 17, 1929   Medicare Important Message Given:  Yes     Johnell Comings 01/29/2021, 11:10 AM

## 2021-01-29 NOTE — Consult Note (Signed)
[facility] SURGICAL CONSULTATION NOTE (initial) - cpt: 32992   HISTORY OF PRESENT ILLNESS (HPI):  87 y.o. female presented to [facility] ED initially on 08/27 for evaluation of SOB. At the time of presentation, patient's family had a recent admission at outside hospital for CHF exacerbation and had an increase in SOB the morning of presentation. History was otherwise limited. Work up in the ED revealed a leukocytosis to 32.5K, sCr - 1.59, hyponatremia to 126, BNP 916, troponin to 222, and CXR was concerning for increasing pulmonary edema. She was admitted to the medicine service. She was also found to her erythema or her left buttock concerning for cellulitis. She did also undergo CT Pelvis in the ED which was concerning for buttock cellulitis with abscess or subcutaneous air. She was started on  IV vancomycin and Flagyl. Since admission, she has been seen by [facility] RN on 08/29 and recommended topical dressings and frequent repositioning. Her leukocytosis has improved from admission; 32.5K --> 24.8K --> 22.3K. Infectious disease has been consulted as well. Palliative care on board also.   This morning, patient daughter at bedside provides history. She states that she believes this all started during or after her recent admission. Prior to this, she believes her mother was "somewhat mobile, but she sat a lot."   Surgery is consulted by hospitalist physician Dr. Enedina Finner, MD in this context for evaluation and management of left buttock cellulitis.   PAST MEDICAL HISTORY (PMH):  Past Medical History:  Diagnosis Date   A-fib (HCC)    Allergy    Arthritis    COPD (chronic obstructive pulmonary disease) (HCC)    Diabetes mellitus without complication (HCC)    GERD (gastroesophageal reflux disease)    Hyperlipidemia    Hypertension    Myocardial infarction (HCC)    Oxygen dependent    2 liters   Stroke (HCC)    5 years ago per daughter   TIA (transient ischemic attack)      PAST  SURGICAL HISTORY (PSH):  Past Surgical History:  Procedure Laterality Date   ABDOMINAL HYSTERECTOMY  1970   BACK SURGERY  2013   colectomy Right 10/08/2013   Dr. Egbert Garibaldi   CORONARY ANGIOPLASTY WITH STENT PLACEMENT     CORONARY/GRAFT ACUTE MI REVASCULARIZATION N/A 07/02/2020   Procedure: Coronary/Graft Acute MI Revascularization;  Surgeon: Iran Ouch, MD;  Location: [facility] INVASIVE CV LAB;  Service: Cardiovascular;  Laterality: N/A;   LEFT HEART CATH AND CORONARY ANGIOGRAPHY N/A 07/02/2020   Procedure: LEFT HEART CATH AND CORONARY ANGIOGRAPHY;  Surgeon: Iran Ouch, MD;  Location: [facility] INVASIVE CV LAB;  Service: Cardiovascular;  Laterality: N/A;     MEDICATIONS:  Prior to Admission medications   Medication Sig Start Date End Date Taking? Authorizing Provider  budesonide-formoterol (SYMBICORT) 160-4.5 MCG/ACT inhaler Inhale 2 puffs into the lungs 2 (two) times daily. 09/12/20  Yes Lyndon Code, MD  Cholecalciferol (VITAMIN D3) 5000 units TABS Take 5,000 Units by mouth daily.   Yes [provider]  clopidogrel (PLAVIX) 75 MG tablet Take 1 tablet (75 mg total) by mouth daily with breakfast. 07/05/20  Yes Arnetha Courser, MD  esomeprazole (NEXIUM) 20 MG capsule Take 20 mg by mouth 2 (two) times daily. 04/21/20  Yes [provider]  furosemide (LASIX) 20 MG tablet Take 20 mg by mouth.   Yes [provider]  gabapentin (NEURONTIN) 100 MG capsule Take 100 mg by mouth at bedtime.  11/23/19  Yes [provider]  HUMULIN  70/30 KWIKPEN (70-30) 100 UNIT/ML KwikPen Inject 35 Units into the skin 2 (two) times daily. Patient taking differently: Inject 36 Units into the skin 2 (two) times daily. 04/01/20  Yes Lurene Shadow, MD  hydrOXYzine (ATARAX/VISTARIL) 25 MG tablet Take 25 mg by mouth 4 (four) times daily as needed for itching. 06/05/17  Yes [provider]  ipratropium-albuterol (DUONEB) 0.5-2.5 (3) MG/3ML SOLN Take 3 mLs by nebulization every 6 (six) hours  as needed (SOB).   Yes [provider]  isosorbide mononitrate (IMDUR) 30 MG 24 hr tablet Take 90 mg by mouth daily. 11/09/19  Yes [provider]  levothyroxine (SYNTHROID) 50 MCG tablet Take 50 mcg by mouth daily before breakfast.   Yes [provider]  metoprolol succinate (TOPROL-XL) 100 MG 24 hr tablet Take 1 tablet (100 mg total) by mouth daily. Take with or immediately following a meal. 11/25/18  Yes Ouma, Hubbard Hartshorn, NP  montelukast (SINGULAIR) 10 MG tablet Take 1 tablet (10 mg total) by mouth at bedtime. 12/13/20  Yes Lyndon Code, MD  traZODone (DESYREL) 50 MG tablet Take 1 tablet every night for sleep 08/16/20  Yes Lyndon Code, MD  vitamin B-12 (CYANOCOBALAMIN) 1000 MCG tablet Take 1,000 mcg by mouth daily.   Yes [provider]  acetaminophen (TYLENOL) 500 MG tablet Take 500-1,000 mg by mouth every 6 (six) hours as needed for mild pain or moderate pain.    [provider]  acetaminophen-codeine (TYLENOL #3) 300-30 MG tablet Take one tab po bid as needed 12/13/20   Lyndon Code, MD  ALPRAZolam Prudy Feeler) 0.25 MG tablet TAKE ONE TAB PO QHS FOR SLEEP Patient not taking: No sig reported 04/12/20   Lyndon Code, MD  amiodarone (PACERONE) 200 MG tablet Take 1 tablet (200 mg total) by mouth 2 (two) times daily. Patient not taking: No sig reported 07/04/20   Arnetha Courser, MD  atorvastatin (LIPITOR) 80 MG tablet Take 1 tablet (80 mg total) by mouth daily. Patient not taking: No sig reported 07/05/20   Arnetha Courser, MD  chlorpheniramine-HYDROcodone [facility] PENNKINETIC ER) 10-8 MG/5ML SUER Take 5 cc at bedtime at bed time Patient not taking: No sig reported 09/18/20   Lyndon Code, MD  diphenoxylate-atropine (LOMOTIL) 2.5-0.025 MG tablet Take 1 tablet by mouth 2 (two) times daily as needed for diarrhea or loose stools. 06/06/20   [provider]  doxycycline (VIBRA-TABS) 100 MG tablet Take 1 tablet (100 mg total) by mouth 2 (two) times  daily. Patient not taking: No sig reported 12/12/20   McDonough, Salomon Fick, PA-C  irbesartan (AVAPRO) 150 MG tablet Take 1 tablet (150 mg total) by mouth daily. Patient not taking: No sig reported 07/05/20   Arnetha Courser, MD  nitroGLYCERIN (NITROSTAT) 0.4 MG SL tablet Place under the tongue. 11/12/20 11/12/21  [provider]  predniSONE (DELTASONE) 5 MG tablet Take 4 tablets (20 mg total) by mouth daily with breakfast. Take 4 tablet daily for 1 week, then Take 3 tablets daily for 2 days, then Take 2 tablets daily 2 days, thenTake 1 tablet daily Patient not taking: No sig reported 10/04/20   McDonough, Salomon Fick, PA-C  trospium (SANCTURA) 20 MG tablet Take 1 tablet (20 mg total) by mouth daily. Patient not taking: Reported on 01/26/2021 04/01/20   Lurene Shadow, MD  VENTOLIN HFA 108 [phone] Base) MCG/ACT inhaler Inhale 2 puffs into the lungs every 6 (six) hours as needed for wheezing or shortness of breath.  [provider]     ALLERGIES:  Allergies  Allergen Reactions   Diphenhydramine Rash    AGITATION/DELIRIUM   Metformin Diarrhea   Oxybutynin Other (See Comments)    dizzy   Amlodipine Rash   Ciprofloxacin Rash   Lisinopril Rash   Penicillins Rash    Family states was a "long time ago"   Saxagliptin Rash   Sulfamethoxazole-Trimethoprim Rash     SOCIAL HISTORY:  Social History   Socioeconomic History   Marital status: Widowed    Spouse name: Not on file   Number of children: 3   Years of education: H/S   Highest education level: Not on file  Occupational History   Occupation: Retired  Tobacco Use   Smoking status: Former    Packs/day: 1.00    Years: 30.00    Pack years: 30.00    Types: Cigarettes    Quit date: 11/30/1999    Years since quitting: 21.1   Smokeless tobacco: Never  Vaping Use   Vaping Use: Never used  Substance and Sexual Activity   Alcohol use: No   Drug use: No   Sexual activity: Not on file  Other Topics Concern   Not on file  Social  History Narrative   Not on file   Social Determinants of Health   Financial Resource Strain: Not on file  Food Insecurity: Not on file  Transportation Needs: Not on file  Physical Activity: Not on file  Stress: Not on file  Social Connections: Not on file  Intimate Partner Violence: Not on file     FAMILY HISTORY:  Family History  Problem Relation Age of Onset   Breast cancer Mother    Pancreatitis Father    Breast cancer Sister    Lung cancer Brother    Melanoma Brother    Throat cancer Brother       REVIEW OF SYSTEMS:  Review of Systems  Unable to perform ROS: Mental status change  Skin:        + Left Buttock Cellulitis   VITAL SIGNS:  Temp:  [97.9 F (36.6 C)-98.9 F (37.2 C)] 98.1 F (36.7 C) (08/30 0738) Pulse Rate:  [85-110] 94 (08/30 0738) Resp:  [16-21] 16 (08/30 0738) BP: (101-115)/(49-55) 115/52 (08/30 0738) SpO2:  [90 %-100 %] 98 % (08/30 0920) Weight:  [81.1 kg] 81.1 kg (08/30 0607)     Height: 5' (152.4 cm) Weight: 81.1 kg BMI (Calculated): 34.9   INTAKE/OUTPUT:  08/29 0701 - 08/30 0700 In: 920 [P.O.:720; IV Piggyback:200] Out: 950 [Urine:950]  PHYSICAL EXAM:  Physical Exam Vitals and nursing note reviewed. Exam conducted with a chaperone present.  Constitutional:      General: She is not in acute distress.    Appearance: Normal appearance. She is obese. She is not ill-appearing.     Comments: Patient rolled undergoing care, daughter at bedside   HENT:     Head: Normocephalic and atraumatic.  Eyes:     Conjunctiva/sclera: Conjunctivae normal.     Pupils: Pupils are equal, round, and reactive to light.  Pulmonary:     Effort: Pulmonary effort is normal. No respiratory distress.  Genitourinary:    Comments: Deferred, pure wick in place  Musculoskeletal:     Right lower leg: No edema.     Left lower leg: No edema.  Skin:    General: Skin is warm and dry.     Findings: Erythema present. No abscess or signs of injury.  Neurological:      Mental Status: She is alert. Mental status is at baseline.  Psychiatric:     Comments: Unable to reliably assess     Left Buttock (08/27):     Left Buttock (08/30):    Labs:  CBC Latest Ref Rng & Units 01/29/2021 01/27/2021 01/26/2021  WBC 4.0 - 10.5 K/uL 22.3(H) 24.8(H) 32.5(H)  Hemoglobin 12.0 - 15.0 g/dL 7.3(L) 8.2(L) 9.0(L)  Hematocrit 36.0 - 46.0 % 22.9(L) 26.7(L) 29.0(L)  Platelets 150 - 400 K/uL 357 334 436(H)   CMP Latest Ref Rng & Units 01/29/2021 01/28/2021 01/27/2021  Glucose 70 - 99 mg/dL - 440(H) 474(Q)  BUN 8 - 23 mg/dL - 59(D) 63(O)  Creatinine 0.44 - 1.00 mg/dL 7.56(E) 3.32(R) 5.18(A)  Sodium 135 - 145 mmol/L - 125(L) 125(L)  Potassium 3.5 - 5.1 mmol/L - 3.7 4.2  Chloride 98 - 111 mmol/L - 89(L) 91(L)  CO2 22 - 32 mmol/L - 25 23  Calcium 8.9 - 10.3 mg/dL - 8.1(L) 8.1(L)  Total Protein 6.5 - 8.1 g/dL - - -  Total Bilirubin 0.3 - 1.2 mg/dL - - -  Alkaline Phos 38 - 126 U/L - - -  AST 15 - 41 U/L - - -  ALT 0 - 44 U/L - - -     Imaging studies:   CT Pelvis (01/26/2021) personally reviewed which shows induration of the left buttock, no abscess, no subcutaneous air, and radiologist report reviewed below:  IMPRESSION: Right buttock cellulitis without defined abscess.   Colonic diverticular disease without CT evidence of active inflammation.   Aortic Atherosclerosis (ICD10-I70.0).    Assessment/Plan: (ICD-10's: L03.317) 87 y.o. female with left buttock cellulitis without gross abscess no r evidence of necrotizing infection, complicated by multiple pertinent comorbidities including advanced age and deconditioning.   - No indication for emergent surgical intervention currently based on examination/radiology findings. Patient and daughter are meeting with palliative care later this afternoon to discuss GOC. I did briefly discuss this with the daughter including the role for surgery should her mother develop a worsening infection or abscess. At this time, the  patient's daughter states "she would not want to subject her mother to this," which I feel is very reasonable given her age, deconditioning, and comorbidities.    - Continue IV Abx (Vancomycin); ID following  - Monitor leukocytosis; improving - Continue local wound care + pressure offloading + frequent repositioning    - Pain control prn   - Further management per primary service  All of the above findings and recommendations were discussed with the patient's daughter and the medical team.   Thank you for the opportunity to participate in this patient's care.   -- Lynden Oxford, PA-C [facility] 01/29/2021, 9:52 AM [phone] M-F: 7am - 4pm

## 2021-01-29 NOTE — Progress Notes (Signed)
[facility]  CARDIOLOGY CONSULT NOTE  Patient ID: Peggy [name] MRN: [id number] DOB/AGE: Oct 12, 1928 87 y.o.  Admit date: 01/26/2021 Referring Physician Lorretta Harp Primary Physician Lauro Regulus, MD Primary Cardiologist Jamse Mead Reason for Consultation Elevated troponin  HPI:  Peggy [name] is a 87 year old female with history of CAD (STEMI 07/02/2020 s/p DES to OM1, severe residual mid LAD disease), HFrEF (EF 35 to 40%), paroxysmal A. fib, hypertension, hyperlipidemia, GI bleeding on Xarelto and low-dose Eliquis, TIA 2018, COPD on home O2 who was admitted with shortness of breath and discovered to have sepsis from cellulitis of her left buttock.  Cardiology is consulted for her troponin elevation from 200-600.  Interval History - On BiPAP overnight but complained of increased SOB. This AM felt very short of breath after BiPAP removal.  - WBC remains elevated.   Review of systems complete and found to be negative unless listed above     Past Medical History:  Diagnosis Date   A-fib (HCC)    Allergy    Arthritis    COPD (chronic obstructive pulmonary disease) (HCC)    Diabetes mellitus without complication (HCC)    GERD (gastroesophageal reflux disease)    Hyperlipidemia    Hypertension    Myocardial infarction (HCC)    Oxygen dependent    2 liters   Stroke (HCC)    5 years ago per daughter   TIA (transient ischemic attack)     Past Surgical History:  Procedure Laterality Date   ABDOMINAL HYSTERECTOMY  1970   BACK SURGERY  2013   colectomy Right 10/08/2013   Dr. Egbert Garibaldi   CORONARY ANGIOPLASTY WITH STENT PLACEMENT     CORONARY/GRAFT ACUTE MI REVASCULARIZATION N/A 07/02/2020   Procedure: Coronary/Graft Acute MI Revascularization;  Surgeon: Iran Ouch, MD;  Location: [facility] INVASIVE CV LAB;  Service: Cardiovascular;  Laterality: N/A;   LEFT HEART CATH AND CORONARY ANGIOGRAPHY N/A 07/02/2020   Procedure: LEFT HEART CATH AND CORONARY ANGIOGRAPHY;  Surgeon: Iran Ouch, MD;  Location: [facility] INVASIVE CV LAB;  Service: Cardiovascular;  Laterality: N/A;    Medications Prior to Admission  Medication Sig Dispense Refill Last Dose   budesonide-formoterol (SYMBICORT) 160-4.5 MCG/ACT inhaler Inhale 2 puffs into the lungs 2 (two) times daily. 1 each 3 01/25/2021   Cholecalciferol (VITAMIN D3) 5000 units TABS Take 5,000 Units by mouth daily.   01/25/2021   clopidogrel (PLAVIX) 75 MG tablet Take 1 tablet (75 mg total) by mouth daily with breakfast. 90 tablet 3 01/25/2021   esomeprazole (NEXIUM) 20 MG capsule Take 20 mg by mouth 2 (two) times daily.   01/25/2021   furosemide (LASIX) 20 MG tablet Take 20 mg by mouth.   01/25/2021   gabapentin (NEURONTIN) 100 MG capsule Take 100 mg by mouth at bedtime.    01/25/2021   HUMULIN 70/30 KWIKPEN (70-30) 100 UNIT/ML KwikPen Inject 35 Units into the skin 2 (two) times daily. (Patient taking differently: Inject 36 Units into the skin 2 (two) times daily.)   01/25/2021   hydrOXYzine (ATARAX/VISTARIL) 25 MG tablet Take 25 mg by mouth 4 (four) times daily as needed for itching.  5 01/25/2021   ipratropium-albuterol (DUONEB) 0.5-2.5 (3) MG/3ML SOLN Take 3 mLs by nebulization every 6 (six) hours as needed (SOB).   01/25/2021   isosorbide mononitrate (IMDUR) 30 MG 24 hr tablet Take 90 mg by mouth daily.   01/25/2021   levothyroxine (SYNTHROID) 50 MCG tablet Take 50 mcg by mouth daily before breakfast.  01/25/2021   metoprolol succinate (TOPROL-XL) 100 MG 24 hr tablet Take 1 tablet (100 mg total) by mouth daily. Take with or immediately following a meal. 30 tablet 0 01/25/2021   montelukast (SINGULAIR) 10 MG tablet Take 1 tablet (10 mg total) by mouth at bedtime. 30 tablet 3 01/25/2021   traZODone (DESYREL) 50 MG tablet Take 1 tablet every night for sleep 90 tablet 1 01/25/2021   vitamin B-12 (CYANOCOBALAMIN) 1000 MCG tablet Take 1,000 mcg by mouth daily.   01/25/2021   acetaminophen (TYLENOL) 500 MG tablet Take 500-1,000 mg by mouth every 6  (six) hours as needed for mild pain or moderate pain.   unknown at prn   acetaminophen-codeine (TYLENOL #3) 300-30 MG tablet Take one tab po bid as needed 15 tablet 0 unknown at prn   ALPRAZolam (XANAX) 0.25 MG tablet TAKE ONE TAB PO QHS FOR SLEEP (Patient not taking: No sig reported) 30 tablet 3 Not Taking   amiodarone (PACERONE) 200 MG tablet Take 1 tablet (200 mg total) by mouth 2 (two) times daily. (Patient not taking: No sig reported) 60 tablet 1 Not Taking   atorvastatin (LIPITOR) 80 MG tablet Take 1 tablet (80 mg total) by mouth daily. (Patient not taking: No sig reported) 90 tablet 1 Not Taking   chlorpheniramine-HYDROcodone (TUSSIONEX PENNKINETIC ER) 10-8 MG/5ML SUER Take 5 cc at bedtime at bed time (Patient not taking: No sig reported) 140 mL 0 Not Taking   diphenoxylate-atropine (LOMOTIL) 2.5-0.025 MG tablet Take 1 tablet by mouth 2 (two) times daily as needed for diarrhea or loose stools.   unknown at prn   doxycycline (VIBRA-TABS) 100 MG tablet Take 1 tablet (100 mg total) by mouth 2 (two) times daily. (Patient not taking: No sig reported) 20 tablet 0 Not Taking   irbesartan (AVAPRO) 150 MG tablet Take 1 tablet (150 mg total) by mouth daily. (Patient not taking: No sig reported) 90 tablet 0 Not Taking   nitroGLYCERIN (NITROSTAT) 0.4 MG SL tablet Place under the tongue.   unknown at prn   predniSONE (DELTASONE) 5 MG tablet Take 4 tablets (20 mg total) by mouth daily with breakfast. Take 4 tablet daily for 1 week, then Take 3 tablets daily for 2 days, then Take 2 tablets daily 2 days, thenTake 1 tablet daily (Patient not taking: No sig reported) 100 tablet 0 Not Taking   trospium (SANCTURA) 20 MG tablet Take 1 tablet (20 mg total) by mouth daily. (Patient not taking: Reported on 01/26/2021)   Not Taking   VENTOLIN HFA 108 (90 Base) MCG/ACT inhaler Inhale 2 puffs into the lungs every 6 (six) hours as needed for wheezing or shortness of breath.   1 unknown at prn   Social History    Socioeconomic History   Marital status: Widowed    Spouse name: Not on file   Number of children: 3   Years of education: H/S   Highest education level: Not on file  Occupational History   Occupation: Retired  Tobacco Use   Smoking status: Former    Packs/day: 1.00    Years: 30.00    Pack years: 30.00    Types: Cigarettes    Quit date: 11/30/1999    Years since quitting: 21.1   Smokeless tobacco: Never  Vaping Use   Vaping Use: Never used  Substance and Sexual Activity   Alcohol use: No   Drug use: No   Sexual activity: Not on file  Other Topics Concern   Not on file  Social History Narrative   Not on file   Social Determinants of Health   Financial Resource Strain: Not on file  Food Insecurity: Not on file  Transportation Needs: Not on file  Physical Activity: Not on file  Stress: Not on file  Social Connections: Not on file  Intimate Partner Violence: Not on file    Family History  Problem Relation Age of Onset   Breast cancer Mother    Pancreatitis Father    Breast cancer Sister    Lung cancer Brother    Melanoma Brother    Throat cancer Brother       Review of systems complete and found to be negative unless listed above    PHYSICAL EXAM  General: Elderly female. Laying in bed. HEENT:  Normocephalic and atramatic Neck:  No visible JVD. Lungs: Increased work of breathing.  Anterior lung fields without significant crackles.  BiPAP in place Heart: Irregularly irregular. Distant heart sounds. Abdomen: Bowel sounds are positive, abdomen soft and non-tender  Msk: No apparent deformities Extremities: Trace to 1+ bilateral lower extremity edema.  Mottling improved today. Neuro: Awake and alert today.  Psych: As above.  Labs:   Lab Results  Component Value Date   WBC 22.3 (H) 01/29/2021   HGB 7.3 (L) 01/29/2021   HCT 22.9 (L) 01/29/2021   MCV 77.9 (L) 01/29/2021   PLT 357 01/29/2021    Recent Labs  Lab 01/27/21 0522 01/27/21 1408  01/28/21 0823 01/29/21 0434  NA 130*  131*   < > 125*  --   K 4.3  4.2   < > 3.7  --   CL 92*  94*   < > 89*  --   CO2 24  25   < > 25  --   BUN 35*  37*   < > 48*  --   CREATININE 1.61*  1.46*   < > 1.82* 1.85*  CALCIUM 8.4*  8.5*   < > 8.1*  --   PROT 5.7*  --   --   --   BILITOT 1.3*  --   --   --   ALKPHOS 54  --   --   --   ALT 14  --   --   --   AST 21  --   --   --   GLUCOSE 158*  165*   < > 195*  --    < > = values in this interval not displayed.    Lab Results  Component Value Date   CKTOTAL 130 12/10/2016   CKMB 3.4 10/06/2013   TROPONINI 0.52 (HH) 11/22/2018     Lab Results  Component Value Date   CHOL 172 01/27/2021   CHOL 189 04/15/2020   CHOL 216 (H) 03/07/2019   Lab Results  Component Value Date   HDL 28 (L) 01/27/2021   HDL 35 (L) 04/15/2020   HDL 33 (L) 03/07/2019   Lab Results  Component Value Date   LDLCALC 107 (H) 01/27/2021   LDLCALC 91 04/15/2020   LDLCALC 110 (H) 03/07/2019   Lab Results  Component Value Date   TRIG 184 (H) 01/27/2021   TRIG 314 (H) 04/15/2020   TRIG 365 (H) 03/07/2019   Lab Results  Component Value Date   CHOLHDL 6.1 01/27/2021   CHOLHDL 5.4 04/15/2020   CHOLHDL 6.5 03/07/2019   No results found for: LDLDIRECT    Radiology: CT PELVIS W CONTRAST  Result Date: 01/26/2021 CLINICAL DATA:  Concern for perirectal abscess EXAM: CT PELVIS WITH CONTRAST TECHNIQUE: Multidetector CT imaging of the pelvis was performed using the standard protocol following the bolus administration of intravenous contrast. CONTRAST:  74mL OMNIPAQUE IOHEXOL 350 MG/ML SOLN COMPARISON:  Prior CT of the abdomen and pelvis 10/08/2013 FINDINGS: Urinary Tract:  No abnormality visualized. Bowel: Colonic diverticular disease without CT evidence of active inflammation. Incompletely imaged surgical changes of prior right hemicolectomy. Vascular/Lymphatic: Atherosclerotic calcifications noted circumferentially around the distal aorta. No evidence of  aneurysm. No suspicious lymphadenopathy. Reproductive: Surgical changes of prior hysterectomy. No mass or other significant abnormality Other: Diffuse interstitial stranding throughout the subcutaneous fat of the right inferior buttock. There is also associated mild skin thickening. No discrete fluid collection to suggest the presence of abscess. Musculoskeletal: No acute fracture or malalignment. Lower lumbar degenerative disc disease. IMPRESSION: Right buttock cellulitis without defined abscess. Colonic diverticular disease without CT evidence of active inflammation. Aortic Atherosclerosis (ICD10-I70.0). Electronically Signed   By: Malachy Moan M.D.   On: 01/26/2021 14:54   DG Chest Portable 1 View  Result Date: 01/26/2021 CLINICAL DATA:  Shortness of breath. EXAM: PORTABLE CHEST 1 VIEW COMPARISON:  April 14, 2020. FINDINGS: Stable cardiomediastinal silhouette. Increased bilateral perihilar and basilar interstitial densities are noted concerning for pulmonary edema. No pneumothorax is noted. Bony thorax is unremarkable. IMPRESSION: Increased bilateral lung opacities are noted concerning for worsening pulmonary edema. Aortic Atherosclerosis (ICD10-I70.0). Electronically Signed   By: Lupita Raider M.D.   On: 01/26/2021 10:25   ECHOCARDIOGRAM COMPLETE  Result Date: 01/27/2021    ECHOCARDIOGRAM REPORT   Patient Name:   Peggy [name] Date of Exam: 01/27/2021 Medical Rec #:  [id number]   Height:       60.0 in Accession #:    [id number]  Weight:       188.1 lb Date of Birth:  [birth year]   BSA:          1.818 m Patient Age:    89 years    BP:           102/51 mmHg Patient Gender: F           HR:           121 bpm. Exam Location:  [facility] Procedure: 2D Echo Indications:     Elevated Troponin  History:         Patient has prior history of Echocardiogram examinations.                  Previous Myocardial Infarction, TIA and COPD; Arrythmias:Atrial                  Fibrillation.  Sonographer:     L Thornton-Maynard  Referring Phys:  [id number] Alycia Rossetti MATTHEW Livingston Denner Diagnosing Phys: Sena Slate IMPRESSIONS  1. Left ventricular ejection fraction, by estimation, is 25 to 30%. The left ventricle has severely decreased function. The left ventricle demonstrates regional wall motion abnormalities (see scoring diagram/findings for description). There is mild left  ventricular hypertrophy. Left ventricular diastolic parameters are indeterminate.  2. Right ventricular systolic function is normal. The right ventricular size is normal.  3. Left atrial size was mildly dilated.  4. The mitral valve is normal in structure. Moderate mitral valve regurgitation. No evidence of mitral stenosis.  5. The aortic valve is normal in structure. Aortic valve regurgitation is not visualized. Mild to moderate aortic valve sclerosis/calcification is present, without any evidence of aortic stenosis.  6. The inferior vena cava is normal in  size with <50% respiratory variability, suggesting right atrial pressure of 8 mmHg. Comparison(s): Changes from prior study are noted. LV function has worsened and anterior distribution is akinetic. This is in the setting of severe sepsis and AF with RVR. FINDINGS  Left Ventricle: Left ventricular ejection fraction, by estimation, is 25 to 30%. The left ventricle has severely decreased function. The left ventricle demonstrates regional wall motion abnormalities. The left ventricular internal cavity size was normal  in size. There is mild left ventricular hypertrophy. Abnormal (paradoxical) septal motion, consistent with left bundle branch block. Left ventricular diastolic parameters are indeterminate.  LV Wall Scoring: The mid and distal anterior septum, entire apex, and mid inferoseptal segment are akinetic. Right Ventricle: The right ventricular size is normal. No increase in right ventricular wall thickness. Right ventricular systolic function is normal. Left Atrium: Left atrial size was mildly dilated. Right Atrium: Right  atrial size was normal in size. Pericardium: Trivial pericardial effusion is present. Mitral Valve: The mitral valve is normal in structure. Moderate mitral valve regurgitation. No evidence of mitral valve stenosis. MV peak gradient, 10.5 mmHg. The mean mitral valve gradient is 6.0 mmHg. Tricuspid Valve: The tricuspid valve is not well visualized. Tricuspid valve regurgitation is not demonstrated. Aortic Valve: The aortic valve is normal in structure. Aortic valve regurgitation is not visualized. Mild to moderate aortic valve sclerosis/calcification is present, without any evidence of aortic stenosis. Aortic valve mean gradient measures 7.0 mmHg. Aortic valve peak gradient measures 11.0 mmHg. Aortic valve area, by VTI measures 0.66 cm. Pulmonic Valve: The pulmonic valve was not well visualized. Pulmonic valve regurgitation is not visualized. Aorta: The aortic root was not well visualized. Venous: The inferior vena cava is normal in size with less than 50% respiratory variability, suggesting right atrial pressure of 8 mmHg. IAS/Shunts: The interatrial septum was not assessed.  LEFT VENTRICLE PLAX 2D LVIDd:         4.58 cm     Diastology LVIDs:         3.06 cm     LV e' medial:    4.05 cm/s LV PW:         1.30 cm     LV E/e' medial:  35.6 LV IVS:        1.03 cm     LV e' lateral:   6.32 cm/s LVOT diam:     1.70 cm     LV E/e' lateral: 22.8 LV SV:         17 LV SV Index:   10 LVOT Area:     2.27 cm  LV Volumes (MOD) LV vol d, MOD A2C: 80.5 ml LV vol d, MOD A4C: 85.0 ml LV vol s, MOD A2C: 45.2 ml LV vol s, MOD A4C: 70.0 ml LV SV MOD A2C:     35.3 ml LV SV MOD A4C:     85.0 ml LV SV MOD BP:      29.0 ml RIGHT VENTRICLE RV S prime:     10.90 cm/s TAPSE (M-mode): 1.4 cm LEFT ATRIUM             Index LA diam:        4.30 cm 2.36 cm/m LA Vol (A2C):   79.2 ml 43.56 ml/m LA Vol (A4C):   59.8 ml 32.89 ml/m LA Biplane Vol: 68.3 ml 37.56 ml/m  AORTIC VALVE                    PULMONIC VALVE AV Area (  Vmax):    0.68 cm     PV  Vmax:       0.97 m/s AV Area (Vmean):   0.68 cm     PV Peak grad:  3.7 mmHg AV Area (VTI):     0.66 cm AV Vmax:           166.00 cm/s AV Vmean:          123.000 cm/s AV VTI:            0.262 m AV Peak Grad:      11.0 mmHg AV Mean Grad:      7.0 mmHg LVOT Vmax:         49.60 cm/s LVOT Vmean:        36.600 cm/s LVOT VTI:          0.076 m LVOT/AV VTI ratio: 0.29 MITRAL VALVE MV Area (PHT): 3.67 cm      SHUNTS MV Area VTI:   0.63 cm      Systemic VTI:  0.08 m MV Peak grad:  10.5 mmHg     Systemic Diam: 1.70 cm MV Mean grad:  6.0 mmHg MV Vmax:       1.62 m/s MV Vmean:      113.0 cm/s MR Peak grad:    51.8 mmHg MR Mean grad:    32.0 mmHg MR Vmax:         360.00 cm/s MR Vmean:        265.0 cm/s MR PISA:         1.57 cm MR PISA Eff ROA: 26 mm MR PISA Radius:  0.50 cm MV E velocity: 144.00 cm/s Sena Slate Electronically signed by Sena Slate Signature Date/Time: 01/27/2021/1:08:06 PM    Final     EKG: Probably sinus tachycardia.  Left bundle branch block.  ASSESSMENT AND PLAN:  GERMANY DODGEN is a 87 year old female with history of CAD (STEMI 07/02/2020 s/p DES to OM1, severe residual mid LAD disease), HFrEF (EF 35 to 40%), paroxysmal A. fib, hypertension, hyperlipidemia, GI bleeding on Xarelto and low-dose Eliquis, TIA 2018, COPD on home O2 who was admitted with shortness of breath and discovered to have sepsis from cellulitis of her left buttock.  Cardiology is consulted for her troponin elevation from 200-600.  #Demand ischemia from sepsis #Type II myocardial injury The patient is admitted with sepsis from a left buttock cellulitis as well as heart failure exacerbation which is likely driven by this infection.  She is not complaining of any chest pain.  Notably she has severe residual coronary artery disease including a 90% mid LAD lesion.  In this setting, it is not surprising that she would have an elevated troponin in the setting of systemic infection and heart failure exacerbation.  No indication for  anticoagulation with heparin. - Continue plavix and aspirin 81 mg - Hold beta blocker in the setting of infection and boderline BP.  #Severe sepsis due to left buttock cellulitis Patient has multiorgan dysfunction including AKI, altered mental status, shortness of breath in the setting of a left buttock cellulitis.   Her treatment is complicated by the fact that she has chronic systolic heart failure and atrial fibrillation. -Antibiotics per primary team -Patient is extremely high risk for decompensation.  # Acute on Chronic hypoxic respiratory failure d/t COPD and HF # Acute on chronic systolic heart failure Presented with shortness of breath and sepsis, though on exam she does not appear overtly volume overloaded.  BNP is elevated but this  is nonspecific in the setting of chronic heart failure, AKI, and sepsis.  Echo shows worsening LVEF with anterior hypokinesis in LAD distribution.  EF 20 to 25%. -Hold metoprolol and ibrisartan.   - Worsening respiratory status this morning.  --> Repeat CXR --> resume bipap - Resume IV lasix today for respiratory decompensation; I am worried her renal function will continue to deteriorate but lasix is best chance at improving respiratory status.    # Paroxysmal AF - Prior bleeding with anticoagulation -Back in sinus rhythm this morning.  400 mg twice daily for a total load of 5 g.  Please communicate with pharmacy regarding number of doses required.  After this we will transition to 200 mg daily.   Signed: Armando Reichert MD 01/29/2021, 8:09 AM

## 2021-01-29 NOTE — Consult Note (Signed)
NAME: Peggy [name]  DOB: August 23, 1928  MRN: [id number]  Date/Time: 01/29/2021 2:50 PM  REQUESTING PROVIDER: Dr Allena Katz Subjective:  REASON FOR CONSULT: buttock cellulitits ?chart reviewed, spoke to daughter  Peggy [name] is a 87 y.o. female with a history of HTN, CHF, DM, CAD, Afib, CKD, GI bleed presented to the ED on 01/26/21 with shortness of breath As [er daughter patient has been declining for the past few weeks.Marland Kitchen She was recently admitted to [facility] medical center between 8/19-8/24 for acute on chronic hypoxia and found to have CHF In jan 2022 she underwent LHA and had stent placed for LAD 90% lesion Vitals in the ED BP 125/58, temp 98.9, HR 130 and sats on 15L oxygen was 100% Labs showed Wbc of 32.5, Hb 9, and plt 460 and cr 1.60  She was found to have intergluteal induration on the left buttock area. I am asked to see this patient for the same Past Medical History:  Diagnosis Date   A-fib [facility])    Allergy    Arthritis    COPD (chronic obstructive pulmonary disease) (HCC)    Diabetes mellitus without complication (HCC)    GERD (gastroesophageal reflux disease)    Hyperlipidemia    Hypertension    Myocardial infarction (HCC)    Oxygen dependent    2 liters   Stroke (HCC)    5 years ago per daughter   TIA (transient ischemic attack)     Past Surgical History:  Procedure Laterality Date   ABDOMINAL HYSTERECTOMY  1970   BACK SURGERY  2013   colectomy Right 10/08/2013   Dr. Egbert Garibaldi   CORONARY ANGIOPLASTY WITH STENT PLACEMENT     CORONARY/GRAFT ACUTE MI REVASCULARIZATION N/A 07/02/2020   Procedure: Coronary/Graft Acute MI Revascularization;  Surgeon: Iran Ouch, MD;  Location: [facility] INVASIVE CV LAB;  Service: Cardiovascular;  Laterality: N/A;   LEFT HEART CATH AND CORONARY ANGIOGRAPHY N/A 07/02/2020   Procedure: LEFT HEART CATH AND CORONARY ANGIOGRAPHY;  Surgeon: Iran Ouch, MD;  Location: [facility] INVASIVE CV LAB;  Service: Cardiovascular;  Laterality: N/A;     Social History   Socioeconomic History   Marital status: Widowed    Spouse name: Not on file   Number of children: 3   Years of education: H/S   Highest education level: Not on file  Occupational History   Occupation: Retired  Tobacco Use   Smoking status: Former    Packs/day: 1.00    Years: 30.00    Pack years: 30.00    Types: Cigarettes    Quit date: 11/30/1999    Years since quitting: 21.1   Smokeless tobacco: Never  Vaping Use   Vaping Use: Never used  Substance and Sexual Activity   Alcohol use: No   Drug use: No   Sexual activity: Not on file  Other Topics Concern   Not on file  Social History Narrative   Not on file   Social Determinants of Health   Financial Resource Strain: Not on file  Food Insecurity: Not on file  Transportation Needs: Not on file  Physical Activity: Not on file  Stress: Not on file  Social Connections: Not on file  Intimate Partner Violence: Not on file    Family History  Problem Relation Age of Onset   Breast cancer Mother    Pancreatitis Father    Breast cancer Sister    Lung cancer Brother    Melanoma Brother    Throat cancer Brother  Allergies  Allergen Reactions   Diphenhydramine Rash    AGITATION/DELIRIUM   Metformin Diarrhea   Oxybutynin Other (See Comments)    dizzy   Amlodipine Rash   Ciprofloxacin Rash   Lisinopril Rash   Penicillins Rash    Family states was a "long time ago"   Saxagliptin Rash   Sulfamethoxazole-Trimethoprim Rash   I? Current Facility-Administered Medications  Medication Dose Route Frequency Provider Last Rate Last Admin   acetaminophen (TYLENOL) tablet 650 mg  650 mg Oral Q6H PRN Lorretta Harp, MD   650 mg at 01/27/21 2116   amiodarone (PACERONE) tablet 400 mg  400 mg Oral BID Enedina Finner, MD   400 mg at 01/29/21 9826   aspirin EC tablet 81 mg  81 mg Oral Daily Lorretta Harp, MD   81 mg at 01/29/21 4158   atorvastatin (LIPITOR) tablet 80 mg  80 mg Oral Daily Lorretta Harp, MD   80 mg at 01/29/21  3094   cholecalciferol (VITAMIN D3) tablet 5,000 Units  5,000 Units Oral Daily Lorretta Harp, MD   5,000 Units at 01/29/21 0768   clopidogrel (PLAVIX) tablet 75 mg  75 mg Oral Q breakfast Lorretta Harp, MD   75 mg at 01/29/21 0881   dextromethorphan-guaiFENesin (MUCINEX DM) 30-600 MG per 12 hr tablet 1 tablet  1 tablet Oral BID PRN Lorretta Harp, MD       diclofenac Sodium (VOLTAREN) 1 % topical gel 2 g  2 g Topical Q6H PRN Enedina Finner, MD   2 g at 01/27/21 2117   enoxaparin (LOVENOX) injection 30 mg  30 mg Subcutaneous Q24H Lorretta Harp, MD   30 mg at 01/28/21 2101   gabapentin (NEURONTIN) capsule 100 mg  100 mg Oral QHS Lorretta Harp, MD   100 mg at 01/28/21 2100   hydrALAZINE (APRESOLINE) injection 10 mg  10 mg Intravenous Q2H PRN Enedina Finner, MD       hydrOXYzine (ATARAX/VISTARIL) tablet 25 mg  25 mg Oral QID PRN Lorretta Harp, MD   25 mg at 01/28/21 2248   insulin aspart (novoLOG) injection 0-5 Units  0-5 Units Subcutaneous QHS Lorretta Harp, MD   2 Units at 01/28/21 0109   insulin aspart (novoLOG) injection 0-9 Units  0-9 Units Subcutaneous TID WC Lorretta Harp, MD   2 Units at 01/29/21 1213   insulin aspart protamine- aspart (NOVOLOG MIX 70/30) injection 24 Units  24 Units Subcutaneous BID WC Sharen Hones, RPH   24 Units at 01/29/21 0853   ipratropium (ATROVENT) nebulizer solution 0.5 mg  0.5 mg Nebulization Q6H Lorretta Harp, MD   0.5 mg at 01/29/21 1319   levalbuterol (XOPENEX) nebulizer solution 0.63 mg  0.63 mg Inhalation Q6H PRN Lorretta Harp, MD   0.63 mg at 01/29/21 1031   levothyroxine (SYNTHROID) tablet 50 mcg  50 mcg Oral QAC breakfast Lorretta Harp, MD   50 mcg at 01/29/21 5945   loperamide (IMODIUM) capsule 2 mg  2 mg Oral Q6H PRN Enedina Finner, MD       mometasone-formoterol [facility]) 200-5 MCG/ACT inhaler 2 puff  2 puff Inhalation BID Lorretta Harp, MD   2 puff at 01/29/21 0852   montelukast (SINGULAIR) tablet 10 mg  10 mg Oral QHS Lorretta Harp, MD   10 mg at 01/28/21 2100   nitroGLYCERIN (NITROSTAT) SL tablet  0.4 mg  0.4 mg Sublingual Q5 min PRN Lorretta Harp, MD       ondansetron [facility]) injection 4 mg  4 mg Intravenous Q6H PRN  Enedina FinnerPatel, Sona, MD   4 mg at 01/28/21 1202   pantoprazole (PROTONIX) EC tablet 40 mg  40 mg Oral Daily Lorretta HarpNiu, Xilin, MD   40 mg at 01/29/21 [id number]   traMADol (ULTRAM) tablet 50 mg  50 mg Oral Q12H PRN Enedina FinnerPatel, Sona, MD   50 mg at 01/29/21 [id number]   traZODone (DESYREL) tablet 50 mg  50 mg Oral QHS PRN Lorretta HarpNiu, Xilin, MD   50 mg at 01/27/21 2116   vancomycin (VANCOCIN) IVPB 1000 mg/200 mL premix  1,000 mg Intravenous Q48H Lorretta HarpNiu, Xilin, MD   Stopped at 01/28/21 1603   vitamin B-12 (CYANOCOBALAMIN) tablet 1,000 mcg  1,000 mcg Oral Daily Lorretta HarpNiu, Xilin, MD   1,000 mcg at 01/29/21 [id number]     Abtx:  Anti-infectives (From admission, onward)    Start     Dose/Rate Route Frequency Ordered Stop   01/28/21 1400  vancomycin (VANCOCIN) IVPB 1000 mg/200 mL premix        1,000 mg 200 mL/hr over 60 Minutes Intravenous Every 48 hours 01/26/21 1225     01/26/21 1745  vancomycin (VANCOCIN) IVPB 750 mg/150 ml premix        750 mg 150 mL/hr over 60 Minutes Intravenous  Once 01/26/21 1654 01/26/21 1812   01/26/21 1400  metroNIDAZOLE (FLAGYL) tablet 500 mg  Status:  Discontinued        500 mg Oral Every 8 hours 01/26/21 1205 01/28/21 1013   01/26/21 1300  vancomycin (VANCOREADY) IVPB 750 mg/150 mL  Status:  Discontinued        750 mg 150 mL/hr over 60 Minutes Intravenous  Once 01/26/21 1225 01/26/21 1654   01/26/21 1045  ceFEPIme (MAXIPIME) 2 g in sodium chloride 0.9 % 100 mL IVPB        2 g 200 mL/hr over 30 Minutes Intravenous  Once 01/26/21 1038 01/26/21 1134   01/26/21 1045  vancomycin (VANCOCIN) IVPB 1000 mg/200 mL premix        1,000 mg 200 mL/hr over 60 Minutes Intravenous  Once 01/26/21 1038 01/26/21 1244       REVIEW OF SYSTEMS:  NA Because of SOB and on Bipap Objective:  VITALS:  BP (!) 118/56 (BP Location: Right Arm)   Pulse (!) 102   Temp 98.1 F (36.7 C) (Rectal)   Resp 19   Ht 5' (1.524  m)   Wt 81.1 kg   SpO2 99%   BMI 34.90 kg/m  PHYSICAL EXAM:  General: Awake on BIPAP -later it was removed and she was able to speak Head: Normocephalic, without obvious abnormality, atraumatic. Eyes: Conjunctivae clear, anicteric sclerae. Pupils are equal ENT Nares normal. No drainage or sinus tenderness. Lips, mucosa, and tongue normal. No Thrush Neck: Supple, symmetrical, no adenopathy, thyroid: non tender no carotid bruit and no JVD. Back: left buttock inter gluteal pressure injury unstageable with induration    Lungs: b/l air entry- crepts bases Heart: irregular , 2/6 murmur Abdomen: Soft, non-tender,not distended. Bowel sounds normal. No masses Extremities: atraumatic, no cyanosis. No edema. No clubbing Skin: No rashes or lesions. Or bruising Lymph: Cervical, supraclavicular normal. Neurologic: Grossly non-focal Pertinent Labs Lab Results CBC    Component Value Date/Time   WBC 22.3 (H) 01/29/2021 0434   RBC 2.94 (L) 01/29/2021 0434   HGB 7.3 (L) 01/29/2021 0434   HGB 14.2 09/04/2015 1247   HCT 22.9 (L) 01/29/2021 0434   HCT 44.0 09/04/2015 1247   PLT 357 01/29/2021 0434   PLT 247 09/04/2015 1247  MCV 77.9 (L) 01/29/2021 0434   MCV 86 09/04/2015 1247   MCV 79 (L) 12/03/2013 1042   MCH 24.8 (L) 01/29/2021 0434   MCHC 31.9 01/29/2021 0434   RDW 15.5 01/29/2021 0434   RDW 13.3 09/04/2015 1247   RDW 16.3 (H) 12/03/2013 1042   LYMPHSABS 1.7 07/02/2020 1416   LYMPHSABS 1.8 09/04/2015 1247   LYMPHSABS 1.6 12/03/2013 1042   MONOABS 0.9 07/02/2020 1416   MONOABS 0.6 12/03/2013 1042   EOSABS 0.7 (H) 07/02/2020 1416   EOSABS 0.4 09/04/2015 1247   EOSABS 0.8 (H) 12/03/2013 1042   BASOSABS 0.1 07/02/2020 1416   BASOSABS 0.1 09/04/2015 1247   BASOSABS 0.1 12/03/2013 1042    CMP Latest Ref Rng & Units 01/29/2021 01/28/2021 01/27/2021  Glucose 70 - 99 mg/dL - 177(L) 390(Z)  BUN 8 - 23 mg/dL - 00(P) 23(R)  Creatinine 0.44 - 1.00 mg/dL 0.07(M) 2.26(J) 3.35(K)  Sodium 135  - 145 mmol/L - 125(L) 125(L)  Potassium 3.5 - 5.1 mmol/L - 3.7 4.2  Chloride 98 - 111 mmol/L - 89(L) 91(L)  CO2 22 - 32 mmol/L - 25 23  Calcium 8.9 - 10.3 mg/dL - 8.1(L) 8.1(L)  Total Protein 6.5 - 8.1 g/dL - - -  Total Bilirubin 0.3 - 1.2 mg/dL - - -  Alkaline Phos 38 - 126 U/L - - -  AST 15 - 41 U/L - - -  ALT 0 - 44 U/L - - -      Microbiology: Recent Results (from the past 240 hour(s))  Resp Panel by RT-PCR (Flu A&B, Covid) Nasopharyngeal Swab     Status: None   Collection Time: 01/26/21 10:01 AM   Specimen: Nasopharyngeal Swab; Nasopharyngeal(NP) swabs in vial transport medium  Result Value Ref Range Status   SARS Coronavirus 2 by RT PCR NEGATIVE NEGATIVE Final    Comment: (NOTE) SARS-CoV-2 target nucleic acids are NOT DETECTED.  The SARS-CoV-2 RNA is generally detectable in upper respiratory specimens during the acute phase of infection. The lowest concentration of SARS-CoV-2 viral copies this assay can detect is 138 copies/mL. A negative result does not preclude SARS-Cov-2 infection and should not be used as the sole basis for treatment or other patient management decisions. A negative result may occur with  improper specimen collection/handling, submission of specimen other than nasopharyngeal swab, presence of viral mutation(s) within the areas targeted by this assay, and inadequate number of viral copies(<138 copies/mL). A negative result must be combined with clinical observations, patient history, and epidemiological information. The expected result is Negative.  Fact Sheet for Patients:  BloggerCourse.com  Fact Sheet for Healthcare Providers:  SeriousBroker.it  This test is no t yet approved or cleared by the [place] FDA and  has been authorized for detection and/or diagnosis of SARS-CoV-2 by FDA under an Emergency Use Authorization (EUA). This EUA will remain  in effect (meaning this test can be used)  for the duration of the COVID-19 declaration under Section 564(b)(1) of the Act, 21 U.S.C.section 360bbb-3(b)(1), unless the authorization is terminated  or revoked sooner.       Influenza A by PCR NEGATIVE NEGATIVE Final   Influenza B by PCR NEGATIVE NEGATIVE Final    Comment: (NOTE) The Xpert Xpress SARS-CoV-2/FLU/RSV plus assay is intended as an aid in the diagnosis of influenza from Nasopharyngeal swab specimens and should not be used as a sole basis for treatment. Nasal washings and aspirates are unacceptable for Xpert Xpress SARS-CoV-2/FLU/RSV testing.  Fact Sheet for Patients: BloggerCourse.com  Fact Sheet for Healthcare Providers: SeriousBroker.it  This test is not yet approved or cleared by the [place] FDA and has been authorized for detection and/or diagnosis of SARS-CoV-2 by FDA under an Emergency Use Authorization (EUA). This EUA will remain in effect (meaning this test can be used) for the duration of the COVID-19 declaration under Section 564(b)(1) of the Act, 21 U.S.C. section 360bbb-3(b)(1), unless the authorization is terminated or revoked.  Performed at [facility], [address] [place], [place] [address]   Blood culture (routine x 2)     Status: None (Preliminary result)   Collection Time: 01/26/21 10:39 AM   Specimen: BLOOD  Result Value Ref Range Status   Specimen Description BLOOD BLOOD RIGHT FOREARM  Final   Special Requests   Final    BOTTLES DRAWN AEROBIC AND ANAEROBIC Blood Culture adequate volume   Culture   Final    NO GROWTH 3 DAYS Performed at [facility], [address], [place], [place] [address]    Report Status PENDING  Incomplete  Blood culture (routine x 2)     Status: None (Preliminary result)   Collection Time: 01/26/21  1:17 PM   Specimen: BLOOD RIGHT HAND  Result Value Ref Range Status   Specimen Description BLOOD RIGHT HAND  Final   Special  Requests   Final    BOTTLES DRAWN AEROBIC AND ANAEROBIC Blood Culture adequate volume   Culture   Final    NO GROWTH 3 DAYS Performed at [facility], [address], [place], [place] [address]    Report Status PENDING  Incomplete  C Difficile Quick Screen w PCR reflex     Status: None   Collection Time: 01/28/21 11:50 AM   Specimen: STOOL  Result Value Ref Range Status   C Diff antigen NEGATIVE NEGATIVE Final   C Diff toxin NEGATIVE NEGATIVE Final   C Diff interpretation No C. difficile detected.  Final    Comment: Performed at [facility], [address] [place], [place] [address]    IMAGING RESULTS:  I have personally reviewed the films Enlargement of cardiac silhouette with pulmonary vascular congestion and persistent pulmonary edema  ? Impression/Recommendation ? ?Acute on chronic resp failure due to CHF, severe ischemic cardiomyopathy on BIPAP  Sepsis with leucocytosis due to unstageable  Pressure area, cellulitis left buttock On vanco - change to cefazolin+ flagyl While I was seeing patient and talking to daughter, palliative NP came to te room and after discussion with patient and family she was made full comfort care   ? ___________________________________________________ Discussed with patient, requesting provider Note:  This document was prepared using Dragon voice recognition software and may include unintentional dictation errors.

## 2021-01-29 NOTE — Progress Notes (Addendum)
[place] [facility] [place] NAME: Elliana Bal    MR#:  [id number]  DATE OF BIRTH:  1928-09-01  SUBJECTIVE:   patient had a rough start in the morning with increasing shortness of breath and had to be placed on BiPAP. Daughter at bedside. Patient is hard on hearing. Complains of a lot of bilateral knee pain more on the left. Overall has not shown much improvement despite treatment for multiple medical issues.  REVIEW OF SYSTEMS:   Review of Systems  Constitutional:  Negative for chills, fever and weight loss.  HENT:  Negative for ear discharge, ear pain and nosebleeds.   Eyes:  Negative for blurred vision, pain and discharge.  Respiratory:  Positive for shortness of breath. Negative for sputum production, wheezing and stridor.   Cardiovascular:  Negative for chest pain, palpitations, orthopnea and PND.  Gastrointestinal:  Negative for abdominal pain, diarrhea, nausea and vomiting.  Genitourinary:  Negative for frequency and urgency.  Musculoskeletal:  Positive for back pain and joint pain.  Neurological:  Positive for weakness. Negative for sensory change, speech change and focal weakness.  Psychiatric/Behavioral:  Negative for depression and hallucinations. The patient is not nervous/anxious.   Tolerating Diet:yes Tolerating PT: SNF   DRUG ALLERGIES:   Allergies  Allergen Reactions   Diphenhydramine Rash    AGITATION/DELIRIUM   Metformin Diarrhea   Oxybutynin Other (See Comments)    dizzy   Amlodipine Rash   Ciprofloxacin Rash   Lisinopril Rash   Penicillins Rash    Family states was a "long time ago"   Saxagliptin Rash   Sulfamethoxazole-Trimethoprim Rash    VITALS:  Blood pressure (!) 118/56, pulse (!) 102, temperature 98.1 F (36.7 C), temperature source Rectal, resp. rate 19, height 5' (1.524 m), weight 81.1 kg, SpO2 99 %.  PHYSICAL EXAMINATION:   Physical Exam  GENERAL:  87 y.o.-year-old patient lying in the bed with no acute  distress. Very deconditioned, critically ill HEENT: Head atraumatic, normocephalic. Oropharynx and nasopharynx clear. BIPAP+ HOH+ LUNGS: decreased breath sounds bilaterally, no wheezing, rales, rhonchi. No use of accessory muscles of respiration.  CARDIOVASCULAR: S1, S2 normal. No murmurs, rubs, or gallops. Irregular, tachycardia ABDOMEN: Soft, nontender, nondistended. Bowel sounds present. No organomegaly or mass.  EXTREMITIES: + edema b/l.    NEUROLOGIC: non focal PSYCHIATRIC:  patient is alert and awake  SKIN: ON day of admission  01/29/2021 (above)  LABORATORY PANEL:  CBC Recent Labs  Lab 01/29/21 0434  WBC 22.3*  HGB 7.3*  HCT 22.9*  PLT 357     Chemistries  Recent Labs  Lab 01/27/21 0522 01/27/21 1408 01/28/21 0823 01/29/21 0434  NA 130*  131*   < > 125*  --   K 4.3  4.2   < > 3.7  --   CL 92*  94*   < > 89*  --   CO2 24  25   < > 25  --   GLUCOSE 158*  165*   < > 195*  --   BUN 35*  37*   < > 48*  --   CREATININE 1.61*  1.46*   < > 1.82* 1.85*  CALCIUM 8.4*  8.5*   < > 8.1*  --   MG 1.9  2.0  --  2.0  --   AST 21  --   --   --   ALT 14  --   --   --   ALKPHOS 54  --   --   --  BILITOT 1.3*  --   --   --    < > = values in this interval not displayed.    Cardiac Enzymes No results for input(s): TROPONINI in the last 168 hours. RADIOLOGY:  DG Chest Port 1 View  Result Date: 01/29/2021 CLINICAL DATA:  Shortness of breath EXAM: PORTABLE CHEST 1 VIEW COMPARISON:  Portable exam 0856 hours compared to 01/26/2021 FINDINGS: Enlargement of cardiac silhouette with pulmonary vascular congestion. Atherosclerotic calcification aorta. Diffuse BILATERAL pulmonary infiltrates favor pulmonary edema. No pleural effusion or pneumothorax. Osseous structures unremarkable. IMPRESSION: Enlargement of cardiac silhouette with pulmonary vascular congestion and persistent pulmonary edema. Aortic Atherosclerosis (ICD10-I70.0). Electronically Signed   By: Lavonia Dana M.D.   On:  01/29/2021 10:45   ASSESSMENT AND PLAN:   KENNADIE BRENNER is a 87 y.o. female with medical history significant of hypertension, hyperlipidemia, diabetes mellitus, COPD on 2 L oxygen, stroke, TIA, GERD, hypothyroidism, anxiety, CAD, myocardial infarction, atrial fibrillation not on anticoagulants, GI bleeding, CHF with EF of 35-40%, CKD-3A, who presents with shortness of breath. Patient was found to have acute respiratory distress, oxygen saturation 84% on home level of 2 L oxygen, BiPAP started in ED.  severe sepsis due to cellulitis left buttock -- came in with white count of 32,000 tachycardia tachypnea and respiratory failure -- currently on IV vancomycin and Flagyl--change to IV vanc -- white count 32K--- 24K -- patient afebrile -- elevated ESR and CRP -- elevated Pro calcitonin -- CT pelvis no evidence of abscess --8/30 (picture above) shows redness improved. Seen by surgery. Recommends continue antibiotics no surgical indication. Patient is a poor risk for any sort of anesthesia given respiratory condition -- ID consultation for help with antibiotics  acute on chronic respiratory failure with hypoxia due to acute on chronic systolic congestive heart failure with severe cardiomyopathy and bilateral pulmonary edema -- patient currently on BiPAP -- study Lasix initially was not able to give it secondary to hypotension -- no urine output so for today. Discussed with cardiology okay to start some Lasix --shows significant decrease EF of 25% with hypokinesis in the LAD region per cardiology. Continue medical management. -- 8/29-- good urine output. Continue Lasix. --8/30-- placed on BiPAP in the morning given increased respiratory distress. Given IV Lasix today. Followed by  [facility] cardiology  a fib with RVR elevated troponin demand ischemia -- seen by Dr. Corky Sox -- recommend starting patient on amiodarone drip given severe tachycardia -- Echo as above --8/29-- change to oral amiodarone per  cardiology recommendation --8/30--cont oral amiodarone  history of CVA -- continue Plavix and Lipitor  COPD -- continue oxygen, bronchodilators and Singulair  acute renal failure superimposed on stage IIIa chronic kidney disease -- baseline creatinine 1.0 -- came in with creatinine of 1.5--IV lasix--1.85 -- monitor input output avoid nephrotoxic agents  Hypothyroidism -- continue Synthroid  bilateral ankle lower extremity pain secondary to DJD -- Voltaren gel and PRN Ultram  Diarrhea -- C diff--neg  patient overall seems to have declined in the last few weeks. [place] [facility] [place] symptoms of CHF. Per family/daughter overall decline in health condition and deconditioning.  palliative care input appreciated. Family discussed at length with palliative care and are considering full comfort care. Will await their final decision    Family communication : daughter in the room Consults : cardiology Dr Corky Sox CODE STATUS: DNR DVT Prophylaxis : enoxaparin Level of care: Progressive Cardiac Status is: Inpatient  Remains inpatient  appropriate because:Inpatient level of care appropriate due to severity of illness  Dispo: The patient is from: Home              Anticipated d/c is to: rehab              Patient currently is not medically stable to d/c.   Difficult to place patient No        TOTAL TIME TAKING CARE OF THIS PATIENT: 35 minutes.  >50% time spent on counselling and coordination of care  Note: This dictation was prepared with Dragon dictation along with smaller phrase technology. Any transcriptional errors that result from this process are unintentional.  Fritzi Mandes M.D    [facility]   CC: Primary care physician; Kirk Ruths, MD Patient ID: Lennie Odor, female   DOB: 08-09-1928, 87 y.o.   MRN: [id number]

## 2021-01-29 NOTE — Progress Notes (Signed)
PT Cancellation Note  Patient Details Name: Peggy [name] MRN: [id number] DOB: [birth year]   Cancelled Treatment:    Reason Eval/Treat Not Completed: Medical issues which prohibited therapy Per notes, back on bipap today after worsening SOB this morning. Palliative Care discussions ongoing with family to establish goals of care. PT will hold today and follow up as appropriate.   Donna Bernard, PT, MPT  Ina Homes 01/29/2021, 3:19 PM

## 2021-01-30 ENCOUNTER — Encounter: Payer: Self-pay | Admitting: Internal Medicine

## 2021-01-30 DIAGNOSIS — L03317 Cellulitis of buttock: Secondary | ICD-10-CM

## 2021-01-30 LAB — GLUCOSE, CAPILLARY: Glucose-Capillary: 264 mg/dL — ABNORMAL HIGH (ref 70–99)

## 2021-01-30 MED ORDER — IPRATROPIUM-ALBUTEROL 0.5-2.5 (3) MG/3ML IN SOLN
3.0000 mL | RESPIRATORY_TRACT | Status: DC | PRN
  Administered 2021-01-30: 3 mL via RESPIRATORY_TRACT
  Filled 2021-01-30: qty 3

## 2021-01-30 NOTE — Progress Notes (Signed)
Daily Progress Note   Patient Name: Peggy [name]       Date: 01/30/2021 DOB: January 29, 1929  Age: 87 y.o. MRN#: [id number] Attending Physician: Lurene Shadow, MD Primary Care Physician: Lauro Regulus, MD Admit Date: 01/26/2021  Reason for Consultation/Follow-up: Establishing goals of care  Patient Profile/HPI:  87 y.o. female  with past medical history of COPD on 2L at baseline, CKD, DM, stroke, TIA, GERD, MI, CAD, CHF with EF 35-40% admitted on 01/26/2021 with shortness of breath- workup reveals sepsis possibly due to cellulitis of her left buttocks, heart failure exacerbation, a-fib with RVR, elevated troponins (likely due to demand ischemia from sepsis), acute on chronic kidney injury. Inpatient ECHO shows worsening heart failure with EF 20-25%. She did not tolerate bipap. Given lasix x 1 but difficulty to manage fluid volume due to low BP and increasing Cr. Palliative medicine consulted for goals of care as family indicates patient has been declining.   Subjective: Patient in bed asleep- breathing is a bit labored- requested RN to administer comfort medication.  She has utilized 2 mg in the last 24 hours. Last dose was 30 mins ago.   Review of Systems  Unable to perform ROS: Acuity of condition    Physical Exam Vitals and nursing note reviewed.  Pulmonary:     Comments: Mildly labored Neurological:     Comments: Sleeping             Vital Signs: BP (!) 127/57 (BP Location: Right Arm)   Pulse 88   Temp 98.2 F (36.8 C)   Resp 18   Ht 5' (1.524 m)   Wt 81.1 kg   SpO2 100%   BMI 34.90 kg/m  SpO2: SpO2: 100 % O2 Device: O2 Device: High Flow Nasal Cannula O2 Flow Rate: O2 Flow Rate (L/min): 5 L/min  Intake/output summary:  Intake/Output Summary (Last 24 hours) at 01/30/2021  0934 Last data filed at 01/29/2021 1659 Gross per 24 hour  Intake 960 ml  Output 300 ml  Net 660 ml   LBM: Last BM Date: 01/28/21 Baseline Weight: Weight: 86.6 kg Most recent weight: Weight: 81.1 kg       Palliative Assessment/Data: PPS: 20%      Patient Active Problem List   Diagnosis Date Noted   Goals of care, counseling/discussion  Advanced care planning/counseling discussion    Comfort measures only status    Acute on chronic respiratory failure with hypoxia (HCC) 01/26/2021   Acute on chronic systolic CHF (congestive heart failure) (HCC) 01/26/2021   Elevated troponin 01/26/2021   Type II diabetes mellitus with renal manifestations (HCC) 01/26/2021   Acute renal failure superimposed on stage 3a chronic kidney disease (HCC) 01/26/2021   Hyponatremia 01/26/2021   Sepsis (HCC) 01/26/2021   Cellulitis of left buttock 01/26/2021   ST elevation myocardial infarction (STEMI) of lateral wall (HCC) 07/02/2020   Abnormality of gait 05/02/2020   Atrial fibrillation with rapid ventricular response (HCC) 04/14/2020   Melena 04/14/2020   COPD with acute exacerbation (HCC) 03/30/2020   Elevated troponin level 03/30/2020   Leukocytosis 03/30/2020   COPD (chronic obstructive pulmonary disease) (HCC) 03/22/2020   Chest pain with high risk for cardiac etiology 11/09/2019   Osteoarthritis of multiple joints 03/01/2019   Uncontrolled hypertension 11/22/2018   Primary osteoarthritis of both knees 09/07/2018   Chronic respiratory failure with hypoxia (HCC) 10/08/2017   Simple chronic bronchitis (HCC) 10/08/2017   SOB (shortness of breath) 09/24/2017   History of stroke 08/17/2017   Acute respiratory failure with hypoxia and hypercarbia (HCC) 08/07/2017   Respiratory failure (HCC) 08/07/2017   History of GI bleed 07/07/2017   Atrial fibrillation with RVR (HCC) 06/30/2017   Bilateral carotid artery disease (HCC) 04/14/2017   Healthcare maintenance 04/14/2017   Dizziness 04/07/2017    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 02/23/2017   TIA (transient ischemic attack) 12/10/2016   CVA (cerebral vascular accident) (HCC) 12/09/2016   Bilateral arm pain 11/27/2016   Myocardial infarction (HCC) 07/30/2015   Urinary frequency 02/09/2015   RUQ abdominal pain 01/26/2015   Shortness of breath 01/26/2015   Anxiety 01/26/2015   Chronic pruritus 12/21/2014   Cerebral vascular accident (HCC) 11/16/2014   Chronic low back pain 11/16/2014   Adult hypothyroidism 11/15/2014   Allergic rhinitis 11/15/2014   Body mass index (BMI) of 28.0-28.9 in adult 11/15/2014   Arthritis 11/15/2014   Cramps of lower extremity 11/15/2014   Essential (primary) hypertension 11/15/2014   Acid reflux 11/15/2014   Hypercholesteremia 11/15/2014   Cannot sleep 11/15/2014   Psoriasis 11/15/2014   Itch of skin 11/15/2014   Restless leg 11/15/2014   Type 2 diabetes mellitus with other specified complication (HCC) 11/15/2014   Breath shortness 11/15/2014   Dermatitis due to unknown cause 04/10/2014   CAD (coronary artery disease) 02/20/2014   AF (paroxysmal atrial fibrillation) (HCC) 02/20/2014   Temporary cerebral vascular dysfunction 02/20/2014   DD (diverticular disease) 10/05/2013    Palliative Care Assessment & Plan    Assessment/Recommendations/Plan  Continue current comfort medications as ordered Awaiting inpatient hospice placement   Code Status: DNR  Prognosis:  < 2 weeks  Discharge Planning: Hospice facility  Care plan was discussed with patient's care team.  Thank you for allowing the Palliative Medicine Team to assist in the care of this patient.  Total time: 26 mins Prolonged billing: N     Greater than 50%  of this time was spent counseling and coordinating care related to the above assessment and plan.  Ocie Bob, AGNP-C Palliative Medicine   Please contact Palliative Medicine Team phone at [phone] for questions and concerns.

## 2021-01-30 NOTE — Progress Notes (Signed)
Palliative Medicine RN Note: PMT office rec'd a call from pt's daughter [name] [phone]), who was asking about an update on finding a hospice bed. Sent secure chat to [employer], aka [facility] liaison [place].  Clarisse Gouge called [facility] earlier today, and Karen Kitchens is already working up the referral. Karen Kitchens will call [name] and let her know I reached out also.  Margret Chance Zacherie Honeyman, RN, BSN, [facility] Palliative Medicine Team 01/30/2021 10:58 AM Office [phone]

## 2021-01-30 NOTE — Progress Notes (Addendum)
Progress Note    Peggy [name]  EQA:[id number] DOB: 1928/07/04  DOA: 01/26/2021 PCP: Lauro Regulus, MD      Brief Narrative:    Medical records reviewed and are as summarized below:  Peggy [name] is a 87 y.o. female with medical history significant of hypertension, hyperlipidemia, diabetes mellitus, COPD on 2 L oxygen, stroke, TIA, GERD, hypothyroidism, anxiety, CAD, myocardial infarction, atrial fibrillation not on anticoagulants, GI bleeding, CHF with EF of 35-40%, CKD-3A, who presented with shortness of breath. Patient was found to have acute respiratory distress, oxygen saturation 84% on home level of 2 L oxygen, BiPAP started in ED.      Assessment/Plan:   Principal Problem:   Acute on chronic respiratory failure with hypoxia (HCC) Active Problems:   Adult hypothyroidism   Essential (primary) hypertension   Hypercholesteremia   CAD (coronary artery disease)   AF (paroxysmal atrial fibrillation) (HCC)   CVA (cerebral vascular accident) (HCC)   COPD (chronic obstructive pulmonary disease) (HCC)   Acute on chronic systolic CHF (congestive heart failure) (HCC)   Elevated troponin   Type II diabetes mellitus with renal manifestations (HCC)   Acute renal failure superimposed on stage 3a chronic kidney disease (HCC)   Hyponatremia   Sepsis (HCC)   Cellulitis of right buttock   Goals of care, counseling/discussion   Advanced care planning/counseling discussion   Comfort measures only status   Body mass index is 34.9 kg/m.  (Obesity)   Severe sepsis secondary to right buttock cellulitis Acute on chronic hypoxic respiratory failure Acute on chronic systolic CHF (estimated EF 25%) Fibrillation with RVR Elevated troponin from demand ischemia Hyponatremia COPD History of stroke Diarrhea Hypothyroidism Degenerative joint disease   PLAN  Patient has been transitioned to comfort measures. Continue current management. Awaiting placement to  hospice     Diet Order             Diet Heart Room service appropriate? Yes; Fluid consistency: Thin  Diet effective now                      Consultants: Infectious disease Cardiologist General surgeon  Procedures: None    Medications:    amiodarone  400 mg Oral BID   levothyroxine  50 mcg Oral QAC breakfast   mometasone-formoterol  2 puff Inhalation BID   pantoprazole  40 mg Oral Daily   Continuous Infusions:   Anti-infectives (From admission, onward)    Start     Dose/Rate Route Frequency Ordered Stop   01/29/21 2200  ceFAZolin (ANCEF) IVPB 1 g/50 mL premix  Status:  Discontinued        1 g 100 mL/hr over 30 Minutes Intravenous Every 12 hours 01/29/21 1640 01/29/21 1642   01/29/21 2200  metroNIDAZOLE (FLAGYL) IVPB 500 mg  Status:  Discontinued        500 mg 100 mL/hr over 60 Minutes Intravenous Every 12 hours 01/29/21 1640 01/29/21 1642   01/28/21 1400  vancomycin (VANCOCIN) IVPB 1000 mg/200 mL premix  Status:  Discontinued        1,000 mg 200 mL/hr over 60 Minutes Intravenous Every 48 hours 01/26/21 1225 01/29/21 1640   01/26/21 1745  vancomycin (VANCOCIN) IVPB 750 mg/150 ml premix        750 mg 150 mL/hr over 60 Minutes Intravenous  Once 01/26/21 1654 01/26/21 1812   01/26/21 1400  metroNIDAZOLE (FLAGYL) tablet 500 mg  Status:  Discontinued  500 mg Oral Every 8 hours 01/26/21 1205 01/28/21 1013   01/26/21 1300  vancomycin (VANCOREADY) IVPB 750 mg/150 mL  Status:  Discontinued        750 mg 150 mL/hr over 60 Minutes Intravenous  Once 01/26/21 1225 01/26/21 1654   01/26/21 1045  ceFEPIme (MAXIPIME) 2 g in sodium chloride 0.9 % 100 mL IVPB        2 g 200 mL/hr over 30 Minutes Intravenous  Once 01/26/21 1038 01/26/21 1134   01/26/21 1045  vancomycin (VANCOCIN) IVPB 1000 mg/200 mL premix        1,000 mg 200 mL/hr over 60 Minutes Intravenous  Once 01/26/21 1038 01/26/21 1244              Family Communication/Anticipated D/C date and  plan/Code Status   DVT prophylaxis:      Code Status: DNR  Family Communication: None Disposition Plan:    Status is: Inpatient  Remains inpatient appropriate because:Inpatient level of care appropriate due to severity of illness  Dispo: The patient is from: Home              Anticipated d/c is to:  Hospice house              Patient currently is not medically stable to d/c.   Difficult to place patient No           Subjective:   C/o shortness of breath  Objective:    Vitals:   01/29/21 1135 01/29/21 1319 01/29/21 1326 01/30/21 0429  BP: (!) 118/56   (!) 127/57  Pulse: (!) 102   88  Resp: 19   18  Temp: 98.1 F (36.7 C)   98.2 F (36.8 C)  TempSrc: Rectal     SpO2: 98% 99% 99% 100%  Weight:      Height:       No data found.   Intake/Output Summary (Last 24 hours) at 01/30/2021 1056 Last data filed at 01/29/2021 1659 Gross per 24 hour  Intake 600 ml  Output 300 ml  Net 300 ml   Filed Weights   01/27/21 0500 01/28/21 0504 01/29/21 0607  Weight: 85.3 kg 84.9 kg 81.1 kg    Exam:  GEN: NAD SKIN: Warm and dry.  Unstageable pressure injury on right buttock EYES: No pallor or icterus ENT: MMM CV: Irregular rate and rhythm PULM: CTA B ABD: soft, obese, NT, +BS CNS: AAO x 3, non focal EXT: No edema or tenderness        Data Reviewed:   I have personally reviewed following labs and imaging studies:  Labs: Labs show the following:   Basic Metabolic Panel: Recent Labs  Lab 01/26/21 1001 01/26/21 1544 01/27/21 0522 01/27/21 1408 01/28/21 0823 01/29/21 0434  NA 126* 127* 130*  131* 125* 125*  --   K 4.4 4.6 4.3  4.2 4.2 3.7  --   CL 87* 88* 92*  94* 91* 89*  --   CO2 24 25 24  25 23 25   --   GLUCOSE 267* 210* 158*  165* 299* 195*  --   BUN 35* 34* 35*  37* 40* 48*  --   CREATININE 1.59* 1.60* 1.61*  1.46* 1.57* 1.82* 1.85*  CALCIUM 8.6* 8.4* 8.4*  8.5* 8.1* 8.1*  --   MG 1.9  --  1.9  2.0  --  2.0  --    GFR Estimated  Creatinine Clearance: 19 mL/min (A) (by C-G formula based on SCr  of 1.85 mg/dL (H)). Liver Function Tests: Recent Labs  Lab 01/27/21 0522  AST 21  ALT 14  ALKPHOS 54  BILITOT 1.3*  PROT 5.7*  ALBUMIN 2.7*   No results for input(s): LIPASE, AMYLASE in the last 168 hours. No results for input(s): AMMONIA in the last 168 hours. Coagulation profile Recent Labs  Lab 01/26/21 1001  INR 1.3*    CBC: Recent Labs  Lab 01/26/21 1001 01/27/21 0522 01/29/21 0434  WBC 32.5* 24.8* 22.3*  HGB 9.0* 8.2* 7.3*  HCT 29.0* 26.7* 22.9*  MCV 79.5* 78.8* 77.9*  PLT 436* 334 357   Cardiac Enzymes: No results for input(s): CKTOTAL, CKMB, CKMBINDEX, TROPONINI in the last 168 hours. BNP (last 3 results) No results for input(s): PROBNP in the last 8760 hours. CBG: Recent Labs  Lab 01/28/21 1613 01/28/21 2125 01/29/21 0740 01/29/21 1138 01/29/21 1701  GLUCAP 245* 134* 117* 173* 243*   D-Dimer: No results for input(s): DDIMER in the last 72 hours. Hgb A1c: No results for input(s): HGBA1C in the last 72 hours. Lipid Profile: No results for input(s): CHOL, HDL, LDLCALC, TRIG, CHOLHDL, LDLDIRECT in the last 72 hours. Thyroid function studies: No results for input(s): TSH, T4TOTAL, T3FREE, THYROIDAB in the last 72 hours.  Invalid input(s): FREET3 Anemia work up: No results for input(s): VITAMINB12, FOLATE, FERRITIN, TIBC, IRON, RETICCTPCT in the last 72 hours. Sepsis Labs: Recent Labs  Lab 01/26/21 1001 01/26/21 1317 01/26/21 1510 01/27/21 0522 01/29/21 0434  PROCALCITON  --   --  2.80  --   --   WBC 32.5*  --   --  24.8* 22.3*  LATICACIDVEN  --  1.5  --   --   --     Microbiology Recent Results (from the past 240 hour(s))  Resp Panel by RT-PCR (Flu A&B, Covid) Nasopharyngeal Swab     Status: None   Collection Time: 01/26/21 10:01 AM   Specimen: Nasopharyngeal Swab; Nasopharyngeal(NP) swabs in vial transport medium  Result Value Ref Range Status   SARS Coronavirus 2 by RT  PCR NEGATIVE NEGATIVE Final    Comment: (NOTE) SARS-CoV-2 target nucleic acids are NOT DETECTED.  The SARS-CoV-2 RNA is generally detectable in upper respiratory specimens during the acute phase of infection. The lowest concentration of SARS-CoV-2 viral copies this assay can detect is 138 copies/mL. A negative result does not preclude SARS-Cov-2 infection and should not be used as the sole basis for treatment or other patient management decisions. A negative result may occur with  improper specimen collection/handling, submission of specimen other than nasopharyngeal swab, presence of viral mutation(s) within the areas targeted by this assay, and inadequate number of viral copies(<138 copies/mL). A negative result must be combined with clinical observations, patient history, and epidemiological information. The expected result is Negative.  Fact Sheet for Patients:  BloggerCourse.com  Fact Sheet for Healthcare Providers:  SeriousBroker.it  This test is no t yet approved or cleared by the [place] FDA and  has been authorized for detection and/or diagnosis of SARS-CoV-2 by FDA under an Emergency Use Authorization (EUA). This EUA will remain  in effect (meaning this test can be used) for the duration of the COVID-19 declaration under Section 564(b)(1) of the Act, 21 U.S.C.section 360bbb-3(b)(1), unless the authorization is terminated  or revoked sooner.       Influenza A by PCR NEGATIVE NEGATIVE Final   Influenza B by PCR NEGATIVE NEGATIVE Final    Comment: (NOTE) The Xpert Xpress SARS-CoV-2/FLU/RSV plus assay is intended  as an aid in the diagnosis of influenza from Nasopharyngeal swab specimens and should not be used as a sole basis for treatment. Nasal washings and aspirates are unacceptable for Xpert Xpress SARS-CoV-2/FLU/RSV testing.  Fact Sheet for Patients: BloggerCourse.com  Fact Sheet for  Healthcare Providers: SeriousBroker.it  This test is not yet approved or cleared by the [place] FDA and has been authorized for detection and/or diagnosis of SARS-CoV-2 by FDA under an Emergency Use Authorization (EUA). This EUA will remain in effect (meaning this test can be used) for the duration of the COVID-19 declaration under Section 564(b)(1) of the Act, 21 U.S.C. section 360bbb-3(b)(1), unless the authorization is terminated or revoked.  Performed at [facility], [address] [place], [place] [address]   Blood culture (routine x 2)     Status: None (Preliminary result)   Collection Time: 01/26/21 10:39 AM   Specimen: BLOOD  Result Value Ref Range Status   Specimen Description BLOOD BLOOD RIGHT FOREARM  Final   Special Requests   Final    BOTTLES DRAWN AEROBIC AND ANAEROBIC Blood Culture adequate volume   Culture   Final    NO GROWTH 4 DAYS Performed at [facility], [address], [place], [place] [address]    Report Status PENDING  Incomplete  Blood culture (routine x 2)     Status: None (Preliminary result)   Collection Time: 01/26/21  1:17 PM   Specimen: BLOOD RIGHT HAND  Result Value Ref Range Status   Specimen Description BLOOD RIGHT HAND  Final   Special Requests   Final    BOTTLES DRAWN AEROBIC AND ANAEROBIC Blood Culture adequate volume   Culture   Final    NO GROWTH 4 DAYS Performed at [facility], [address] [place], [place] [address]    Report Status PENDING  Incomplete  C Difficile Quick Screen w PCR reflex     Status: None   Collection Time: 01/28/21 11:50 AM   Specimen: STOOL  Result Value Ref Range Status   C Diff antigen NEGATIVE NEGATIVE Final   C Diff toxin NEGATIVE NEGATIVE Final   C Diff interpretation No C. difficile detected.  Final    Comment: Performed at [facility], [address] [place], [place] [address]    Procedures and diagnostic studies:  DG  Chest [facility] 1 View  Result Date: 01/29/2021 CLINICAL DATA:  Shortness of breath EXAM: PORTABLE CHEST 1 VIEW COMPARISON:  Portable exam 0856 hours compared to 01/26/2021 FINDINGS: Enlargement of cardiac silhouette with pulmonary vascular congestion. Atherosclerotic calcification aorta. Diffuse BILATERAL pulmonary infiltrates favor pulmonary edema. No pleural effusion or pneumothorax. Osseous structures unremarkable. IMPRESSION: Enlargement of cardiac silhouette with pulmonary vascular congestion and persistent pulmonary edema. Aortic Atherosclerosis (ICD10-I70.0). Electronically Signed   By: Ulyses Southward M.D.   On: 01/29/2021 10:45               LOS: 4 days   Kru Allman  [facility]   Pager on [email or web address]. If 7PM-7AM, please contact night-coverage at [email or web address]     01/30/2021, 10:56 AM

## 2021-01-30 NOTE — Progress Notes (Addendum)
[facility]) Hospital Liaison Note  Received request from [place], [place] for family interest in [facility]. Met with patient and daughters, Monia Pouch and Orlan Leavens at bedside to confirm interest and explain services. Chart and patient information reviewed by [facility] physician. Hospice Home eligibility confirmed.   Family is agreeable to transfer to [facility] on 9.1.22. Requesting transport for 11:00 am on 9.1.22. Loletha Grayer, LCSW [facility] Manager aware.   RN please call report to [place] at [phone] prior to patient leaving the unit.   Please send signed and completed DNR with patient at discharge.   Please do not hesitate to cal with any hospice related questions.   Thank you,   Bobbie "Loren Racer, [place], BSN [facility] Liaison [phone]

## 2021-01-30 NOTE — TOC Progression Note (Signed)
Transition of Care [facility]) - Progression Note    Patient Details  Name: Peggy [name] MRN: [id number] Date of Birth: 10/06/1928  [facility]) CM/SW Contact  Maree Krabbe, LCSW Phone Number: 01/30/2021, 11:15 AM  Clinical Narrative:  Refferal provided to Gwynneth Aliment with [employer].         Expected Discharge Plan and Services                                                 Social Determinants of Health (SDOH) Interventions    Readmission Risk Interventions Readmission Risk Prevention Plan 04/02/2020 11/24/2018  Transportation Screening Complete -  PCP or Specialist Appt within 5-7 Days - Complete  Home Care Screening - Complete  Medication Review (RN CM) - Complete  HRI or Home Care Consult Complete -  Palliative Care Screening Not Applicable -  Medication Review (RN Care Manager) Complete -  Some recent data might be hidden

## 2021-01-31 LAB — CULTURE, BLOOD (ROUTINE X 2)
Culture: NO GROWTH
Culture: NO GROWTH
Special Requests: ADEQUATE
Special Requests: ADEQUATE

## 2021-01-31 MED ORDER — HYDROXYZINE HCL 25 MG PO TABS
25.0000 mg | ORAL_TABLET | Freq: Once | ORAL | Status: AC
  Administered 2021-01-31: 25 mg via ORAL
  Filled 2021-01-31: qty 1

## 2021-01-31 NOTE — Progress Notes (Signed)
[facility] [employer] [facility]) Hospital Liaison Note  Transport has been arranged for 11 am with [facility].  Please ensure signed DNR accompanies patient.  RN please call report to [phone].  Thank you, Haynes Bast, BSN, RN [facility] Liaison [phone]

## 2021-01-31 NOTE — Discharge Summary (Addendum)
Physician Discharge Summary  Peggy [name] LOV:[id number] DOB: 02/06/1929 DOA: 01/26/2021  PCP: Lauro Regulus, MD  Admit date: 01/26/2021 Discharge date: 01/31/2021  Discharge disposition: Hospice house   Recommendations for Outpatient Follow-Up:   Follow-up with hospice team within 24 hours of discharge.   Discharge Diagnosis:   Principal Problem:   Acute on chronic respiratory failure with hypoxia (HCC) Active Problems:   Adult hypothyroidism   Essential (primary) hypertension   Hypercholesteremia   CAD (coronary artery disease)   AF (paroxysmal atrial fibrillation) (HCC)   CVA (cerebral vascular accident) (HCC)   COPD (chronic obstructive pulmonary disease) (HCC)   Acute on chronic systolic CHF (congestive heart failure) (HCC)   Elevated troponin   Type II diabetes mellitus with renal manifestations (HCC)   Acute renal failure superimposed on stage 3a chronic kidney disease (HCC)   Hyponatremia   Sepsis (HCC)   Cellulitis of right buttock   Goals of care, counseling/discussion   Advanced care planning/counseling discussion   Comfort measures only status    Discharge Condition: Stable.  Diet recommendation:  Diet Order             Diet - low sodium heart healthy           Diet Heart Room service appropriate? Yes; Fluid consistency: Thin  Diet effective now                     Code Status: DNR     Hospital Course:   Peggy [name] is a 87 y.o. female with medical history significant for hypertension, hyperlipidemia, diabetes mellitus, COPD, chronic hypoxic respiratory failure on 2 L/min oxygen, stroke, TIA, GERD, hypothyroidism, anxiety, CAD, myocardial infarction, atrial fibrillation not on anticoagulants, GI bleeding, CHF with EF of 35-40%, CKD-3A, who presented to the hospital with shortness of breath. Patient was found to have acute respiratory distress, oxygen saturation 84% on home level of 2 L oxygen, BiPAP started in ED.  She was  admitted to the hospital for severe sepsis secondary to left buttock cellulitis from a left buttock wound, acute on chronic hypoxic respiratory failure, acute exacerbation of chronic systolic CHF.  She was treated with IV Lasix and empiric IV antibiotics.  Given the complexity of her illness, the patient and her family requested comfort measures with hospice.  Hospice team was consulted and patient was deemed to be a candidate for discharge to the hospice house. Discharge plan was discussed with patient and her daughters [name] and Marcelino Duster) at the bedside   Medical Consultants:   Infectious disease Cardiologist General surgeon   Discharge Exam:    Vitals:   01/29/21 1326 01/30/21 0429 01/30/21 1927 01/30/21 2040  BP:  (!) 127/57 (!) 122/45   Pulse:  88 (!) 45   Resp:  18 15   Temp:  98.2 F (36.8 C) 100 F (37.8 C)   TempSrc:      SpO2: 99% 100% (!) 81% 98%  Weight:      Height:         GEN: NAD SKIN: No rash. Right buttock wound EYES: EOMI ENT: MMM CV: Irregular rate and rhythm PULM: CTA B ABD: soft, obese, NT, +BS CNS: AAO x 3, non focal EXT: No edema or tenderness   The results of significant diagnostics from this hospitalization (including imaging, microbiology, ancillary and laboratory) are listed below for reference.     Procedures and Diagnostic Studies:   ECHOCARDIOGRAM COMPLETE  Result Date: 01/27/2021  ECHOCARDIOGRAM REPORT   Patient Name:   Peggy [name] Date of Exam: 01/27/2021 Medical Rec #:  [id number]   Height:       60.0 in Accession #:    [id number]  Weight:       188.1 lb Date of Birth:  [birth year]   BSA:          1.818 m Patient Age:    87 years    BP:           102/51 mmHg Patient Gender: F           HR:           121 bpm. Exam Location:  [facility] Procedure: 2D Echo Indications:     Elevated Troponin  History:         Patient has prior history of Echocardiogram examinations.                  Previous Myocardial Infarction, TIA and COPD; Arrythmias:Atrial                   Fibrillation.  Sonographer:     L Thornton-Maynard Referring Phys:  [id number] Alycia Rossetti MATTHEW ORGEL Diagnosing Phys: Sena Slate IMPRESSIONS  1. Left ventricular ejection fraction, by estimation, is 25 to 30%. The left ventricle has severely decreased function. The left ventricle demonstrates regional wall motion abnormalities (see scoring diagram/findings for description). There is mild left  ventricular hypertrophy. Left ventricular diastolic parameters are indeterminate.  2. Right ventricular systolic function is normal. The right ventricular size is normal.  3. Left atrial size was mildly dilated.  4. The mitral valve is normal in structure. Moderate mitral valve regurgitation. No evidence of mitral stenosis.  5. The aortic valve is normal in structure. Aortic valve regurgitation is not visualized. Mild to moderate aortic valve sclerosis/calcification is present, without any evidence of aortic stenosis.  6. The inferior vena cava is normal in size with <50% respiratory variability, suggesting right atrial pressure of 8 mmHg. Comparison(s): Changes from prior study are noted. LV function has worsened and anterior distribution is akinetic. This is in the setting of severe sepsis and AF with RVR. FINDINGS  Left Ventricle: Left ventricular ejection fraction, by estimation, is 25 to 30%. The left ventricle has severely decreased function. The left ventricle demonstrates regional wall motion abnormalities. The left ventricular internal cavity size was normal  in size. There is mild left ventricular hypertrophy. Abnormal (paradoxical) septal motion, consistent with left bundle branch block. Left ventricular diastolic parameters are indeterminate.  LV Wall Scoring: The mid and distal anterior septum, entire apex, and mid inferoseptal segment are akinetic. Right Ventricle: The right ventricular size is normal. No increase in right ventricular wall thickness. Right ventricular systolic function is normal. Left  Atrium: Left atrial size was mildly dilated. Right Atrium: Right atrial size was normal in size. Pericardium: Trivial pericardial effusion is present. Mitral Valve: The mitral valve is normal in structure. Moderate mitral valve regurgitation. No evidence of mitral valve stenosis. MV peak gradient, 10.5 mmHg. The mean mitral valve gradient is 6.0 mmHg. Tricuspid Valve: The tricuspid valve is not well visualized. Tricuspid valve regurgitation is not demonstrated. Aortic Valve: The aortic valve is normal in structure. Aortic valve regurgitation is not visualized. Mild to moderate aortic valve sclerosis/calcification is present, without any evidence of aortic stenosis. Aortic valve mean gradient measures 7.0 mmHg. Aortic valve peak gradient measures 11.0 mmHg. Aortic valve area, by VTI measures 0.66 cm. Pulmonic Valve: The pulmonic valve  was not well visualized. Pulmonic valve regurgitation is not visualized. Aorta: The aortic root was not well visualized. Venous: The inferior vena cava is normal in size with less than 50% respiratory variability, suggesting right atrial pressure of 8 mmHg. IAS/Shunts: The interatrial septum was not assessed.  LEFT VENTRICLE PLAX 2D LVIDd:         4.58 cm     Diastology LVIDs:         3.06 cm     LV e' medial:    4.05 cm/s LV PW:         1.30 cm     LV E/e' medial:  35.6 LV IVS:        1.03 cm     LV e' lateral:   6.32 cm/s LVOT diam:     1.70 cm     LV E/e' lateral: 22.8 LV SV:         17 LV SV Index:   10 LVOT Area:     2.27 cm  LV Volumes (MOD) LV vol d, MOD A2C: 80.5 ml LV vol d, MOD A4C: 85.0 ml LV vol s, MOD A2C: 45.2 ml LV vol s, MOD A4C: 70.0 ml LV SV MOD A2C:     35.3 ml LV SV MOD A4C:     85.0 ml LV SV MOD BP:      29.0 ml RIGHT VENTRICLE RV S prime:     10.90 cm/s TAPSE (M-mode): 1.4 cm LEFT ATRIUM             Index LA diam:        4.30 cm 2.36 cm/m LA Vol (A2C):   79.2 ml 43.56 ml/m LA Vol (A4C):   59.8 ml 32.89 ml/m LA Biplane Vol: 68.3 ml 37.56 ml/m  AORTIC VALVE                     PULMONIC VALVE AV Area (Vmax):    0.68 cm     PV Vmax:       0.97 m/s AV Area (Vmean):   0.68 cm     PV Peak grad:  3.7 mmHg AV Area (VTI):     0.66 cm AV Vmax:           166.00 cm/s AV Vmean:          123.000 cm/s AV VTI:            0.262 m AV Peak Grad:      11.0 mmHg AV Mean Grad:      7.0 mmHg LVOT Vmax:         49.60 cm/s LVOT Vmean:        36.600 cm/s LVOT VTI:          0.076 m LVOT/AV VTI ratio: 0.29 MITRAL VALVE MV Area (PHT): 3.67 cm      SHUNTS MV Area VTI:   0.63 cm      Systemic VTI:  0.08 m MV Peak grad:  10.5 mmHg     Systemic Diam: 1.70 cm MV Mean grad:  6.0 mmHg MV Vmax:       1.62 m/s MV Vmean:      113.0 cm/s MR Peak grad:    51.8 mmHg MR Mean grad:    32.0 mmHg MR Vmax:         360.00 cm/s MR Vmean:        265.0 cm/s MR PISA:         1.57  cm MR PISA Eff ROA: 26 mm MR PISA Radius:  0.50 cm MV E velocity: 144.00 cm/s Sena Slate Electronically signed by Sena Slate Signature Date/Time: 01/27/2021/1:08:06 PM    Final      Labs:   Basic Metabolic Panel: Recent Labs  Lab 01/26/21 1001 01/26/21 1544 01/27/21 0522 01/27/21 1408 01/28/21 0823 01/29/21 0434  NA 126* 127* 130*  131* 125* 125*  --   K 4.4 4.6 4.3  4.2 4.2 3.7  --   CL 87* 88* 92*  94* 91* 89*  --   CO2 24 25 24  25 23 25   --   GLUCOSE 267* 210* 158*  165* 299* 195*  --   BUN 35* 34* 35*  37* 40* 48*  --   CREATININE 1.59* 1.60* 1.61*  1.46* 1.57* 1.82* 1.85*  CALCIUM 8.6* 8.4* 8.4*  8.5* 8.1* 8.1*  --   MG 1.9  --  1.9  2.0  --  2.0  --    GFR Estimated Creatinine Clearance: 19 mL/min (A) (by C-G formula based on SCr of 1.85 mg/dL (H)). Liver Function Tests: Recent Labs  Lab 01/27/21 0522  AST 21  ALT 14  ALKPHOS 54  BILITOT 1.3*  PROT 5.7*  ALBUMIN 2.7*   No results for input(s): LIPASE, AMYLASE in the last 168 hours. No results for input(s): AMMONIA in the last 168 hours. Coagulation profile Recent Labs  Lab 01/26/21 1001  INR 1.3*    CBC: Recent Labs  Lab  01/26/21 1001 01/27/21 0522 01/29/21 0434  WBC 32.5* 24.8* 22.3*  HGB 9.0* 8.2* 7.3*  HCT 29.0* 26.7* 22.9*  MCV 79.5* 78.8* 77.9*  PLT 436* 334 357   Cardiac Enzymes: No results for input(s): CKTOTAL, CKMB, CKMBINDEX, TROPONINI in the last 168 hours. BNP: Invalid input(s): POCBNP CBG: Recent Labs  Lab 01/28/21 2125 01/29/21 0740 01/29/21 1138 01/29/21 1701 01/30/21 2119  GLUCAP 134* 117* 173* 243* 264*   D-Dimer No results for input(s): DDIMER in the last 72 hours. Hgb A1c No results for input(s): HGBA1C in the last 72 hours. Lipid Profile No results for input(s): CHOL, HDL, LDLCALC, TRIG, CHOLHDL, LDLDIRECT in the last 72 hours. Thyroid function studies No results for input(s): TSH, T4TOTAL, T3FREE, THYROIDAB in the last 72 hours.  Invalid input(s): FREET3 Anemia work up No results for input(s): VITAMINB12, FOLATE, FERRITIN, TIBC, IRON, RETICCTPCT in the last 72 hours. Microbiology Recent Results (from the past 240 hour(s))  Resp Panel by RT-PCR (Flu A&B, Covid) Nasopharyngeal Swab     Status: None   Collection Time: 01/26/21 10:01 AM   Specimen: Nasopharyngeal Swab; Nasopharyngeal(NP) swabs in vial transport medium  Result Value Ref Range Status   SARS Coronavirus 2 by RT PCR NEGATIVE NEGATIVE Final    Comment: (NOTE) SARS-CoV-2 target nucleic acids are NOT DETECTED.  The SARS-CoV-2 RNA is generally detectable in upper respiratory specimens during the acute phase of infection. The lowest concentration of SARS-CoV-2 viral copies this assay can detect is 138 copies/mL. A negative result does not preclude SARS-Cov-2 infection and should not be used as the sole basis for treatment or other patient management decisions. A negative result may occur with  improper specimen collection/handling, submission of specimen other than nasopharyngeal swab, presence of viral mutation(s) within the areas targeted by this assay, and inadequate number of viral copies(<138  copies/mL). A negative result must be combined with clinical observations, patient history, and epidemiological information. The expected result is Negative.  Fact Sheet for Patients:  BloggerCourse.comhttps://www.fda.gov/media/152166/download  Fact Sheet for Healthcare Providers:  SeriousBroker.ithttps://www.fda.gov/media/152162/download  This test is no t yet approved or cleared by the [place] FDA and  has been authorized for detection and/or diagnosis of SARS-CoV-2 by FDA under an Emergency Use Authorization (EUA). This EUA will remain  in effect (meaning this test can be used) for the duration of the COVID-19 declaration under Section 564(b)(1) of the Act, 21 U.S.C.section 360bbb-3(b)(1), unless the authorization is terminated  or revoked sooner.       Influenza A by PCR NEGATIVE NEGATIVE Final   Influenza B by PCR NEGATIVE NEGATIVE Final    Comment: (NOTE) The Xpert Xpress SARS-CoV-2/FLU/RSV plus assay is intended as an aid in the diagnosis of influenza from Nasopharyngeal swab specimens and should not be used as a sole basis for treatment. Nasal washings and aspirates are unacceptable for Xpert Xpress SARS-CoV-2/FLU/RSV testing.  Fact Sheet for Patients: BloggerCourse.comhttps://www.fda.gov/media/152166/download  Fact Sheet for Healthcare Providers: SeriousBroker.ithttps://www.fda.gov/media/152162/download  This test is not yet approved or cleared by the [place] FDA and has been authorized for detection and/or diagnosis of SARS-CoV-2 by FDA under an Emergency Use Authorization (EUA). This EUA will remain in effect (meaning this test can be used) for the duration of the COVID-19 declaration under Section 564(b)(1) of the Act, 21 U.S.C. section 360bbb-3(b)(1), unless the authorization is terminated or revoked.  Performed at [facility], [address] [place], [place] [address]   Blood culture (routine x 2)     Status: None   Collection Time: 01/26/21 10:39 AM   Specimen: BLOOD  Result Value Ref Range  Status   Specimen Description BLOOD BLOOD RIGHT FOREARM  Final   Special Requests   Final    BOTTLES DRAWN AEROBIC AND ANAEROBIC Blood Culture adequate volume   Culture   Final    NO GROWTH 5 DAYS Performed at [facility], [address] [place], [place] [address]    Report Status 01/31/2021 FINAL  Final  Blood culture (routine x 2)     Status: None   Collection Time: 01/26/21  1:17 PM   Specimen: BLOOD RIGHT HAND  Result Value Ref Range Status   Specimen Description BLOOD RIGHT HAND  Final   Special Requests   Final    BOTTLES DRAWN AEROBIC AND ANAEROBIC Blood Culture adequate volume   Culture   Final    NO GROWTH 5 DAYS Performed at [facility], [address] [place], [place] [address]    Report Status 01/31/2021 FINAL  Final  C Difficile Quick Screen w PCR reflex     Status: None   Collection Time: 01/28/21 11:50 AM   Specimen: STOOL  Result Value Ref Range Status   C Diff antigen NEGATIVE NEGATIVE Final   C Diff toxin NEGATIVE NEGATIVE Final   C Diff interpretation No C. difficile detected.  Final    Comment: Performed at [facility], [address] [place], [place] [address]     Discharge Instructions:   Discharge Instructions     Diet - low sodium heart healthy   Complete by: As directed    No wound care   Complete by: As directed       Allergies as of 01/31/2021       Reactions   Diphenhydramine Rash   AGITATION/DELIRIUM   Metformin Diarrhea   Oxybutynin Other (See Comments)   dizzy   Amlodipine Rash   Ciprofloxacin Rash   Lisinopril Rash   Penicillins Rash   Family  states was a "long time ago"   Saxagliptin Rash   Sulfamethoxazole-trimethoprim Rash        Medication List     STOP taking these medications    acetaminophen 500 MG tablet Commonly known as: TYLENOL   acetaminophen-codeine 300-30 MG tablet Commonly known as: TYLENOL #3   ALPRAZolam 0.25 MG tablet Commonly known as: XANAX   amiodarone 200  MG tablet Commonly known as: PACERONE   atorvastatin 80 MG tablet Commonly known as: LIPITOR   budesonide-formoterol 160-4.5 MCG/ACT inhaler Commonly known as: SYMBICORT   chlorpheniramine-HYDROcodone 10-8 MG/5ML Suer Commonly known as: Tussionex Pennkinetic ER   clopidogrel 75 MG tablet Commonly known as: PLAVIX   diphenoxylate-atropine 2.5-0.025 MG tablet Commonly known as: LOMOTIL   doxycycline 100 MG tablet Commonly known as: VIBRA-TABS   esomeprazole 20 MG capsule Commonly known as: NEXIUM   furosemide 20 MG tablet Commonly known as: LASIX   gabapentin 100 MG capsule Commonly known as: NEURONTIN   HumuLIN 70/30 KwikPen (70-30) 100 UNIT/ML KwikPen Generic drug: insulin isophane & regular human KwikPen   ipratropium-albuterol 0.5-2.5 (3) MG/3ML Soln Commonly known as: DUONEB   irbesartan 150 MG tablet Commonly known as: AVAPRO   isosorbide mononitrate 30 MG 24 hr tablet Commonly known as: IMDUR   levothyroxine 50 MCG tablet Commonly known as: SYNTHROID   metoprolol succinate 100 MG 24 hr tablet Commonly known as: TOPROL-XL   montelukast 10 MG tablet Commonly known as: SINGULAIR   nitroGLYCERIN 0.4 MG SL tablet Commonly known as: NITROSTAT   predniSONE 5 MG tablet Commonly known as: DELTASONE   traZODone 50 MG tablet Commonly known as: DESYREL   trospium 20 MG tablet Commonly known as: SANCTURA   Ventolin HFA 108 (90 Base) MCG/ACT inhaler Generic drug: albuterol   vitamin B-12 1000 MCG tablet Commonly known as: CYANOCOBALAMIN   Vitamin D3 125 MCG (5000 UT) Tabs       TAKE these medications    hydrOXYzine 25 MG tablet Commonly known as: ATARAX/VISTARIL Take 25 mg by mouth 4 (four) times daily as needed for itching.          Time coordinating discharge: 32 minutes  Signed:  Aniesa Boback  [facility] 01/31/2021, 9:24 AM   Pager on [email or web address]. If 7PM-7AM, please contact night-coverage at [email or web address]

## 2021-01-31 NOTE — Plan of Care (Signed)
Comfort care provided to the patient, given pain medication twice, patient's daughter at bedside, unable to give night shift meds, patient was lethargic. Problem: Education:. Goal: Knowledge of General Education information will improve Description: Including pain rating scale, medication(s)/side effects and non-pharmacologic comfort measures Outcome: Progressing   Problem: Health Behavior/Discharge Planning: Goal: Ability to manage health-related needs will improve Outcome: Progressing   Problem: Clinical Measurements: Goal: Ability to maintain clinical measurements within normal limits will improve Outcome: Progressing Goal: Will remain free from infection Outcome: Progressing Goal: Diagnostic test results will improve Outcome: Progressing Goal: Respiratory complications will improve Outcome: Progressing Goal: Cardiovascular complication will be avoided Outcome: Progressing   Problem: Activity: Goal: Risk for activity intolerance will decrease Outcome: Progressing   Problem: Nutrition: Goal: Adequate nutrition will be maintained Outcome: Progressing   Problem: Coping: Goal: Level of anxiety will decrease Outcome: Progressing   Problem: Elimination: Goal: Will not experience complications related to bowel motility Outcome: Progressing Goal: Will not experience complications related to urinary retention Outcome: Progressing   Problem: Pain Managment: Goal: General experience of comfort will improve Outcome: Progressing   Problem: Safety: Goal: Ability to remain free from injury will improve Outcome: Progressing   Problem: Skin Integrity: Goal: Risk for impaired skin integrity will decrease Outcome: Progressing

## 2021-01-31 NOTE — Progress Notes (Signed)
Discharge report called to Hospice/ verbalized an understanding/ instructed to discharge pt with IVs in place/ EMS to transport/ family aware and at pts bedside

## 2021-02-05 ENCOUNTER — Ambulatory Visit: Payer: Medicare Other | Admitting: Family

## 2021-02-12 ENCOUNTER — Other Ambulatory Visit: Payer: Self-pay | Admitting: Internal Medicine

## 2021-02-12 DIAGNOSIS — G4701 Insomnia due to medical condition: Secondary | ICD-10-CM

## 2021-03-02 DEATH — deceased
